# Patient Record
Sex: Male | Born: 1970
Health system: Southern US, Community
[De-identification: ages and names within clinical notes are randomized; demographics above are authoritative.]

## PROBLEM LIST (undated history)

## (undated) DIAGNOSIS — N529 Male erectile dysfunction, unspecified: Secondary | ICD-10-CM

## (undated) DIAGNOSIS — G709 Myoneural disorder, unspecified: Secondary | ICD-10-CM

## (undated) DIAGNOSIS — B019 Varicella without complication: Secondary | ICD-10-CM

## (undated) DIAGNOSIS — L989 Disorder of the skin and subcutaneous tissue, unspecified: Secondary | ICD-10-CM

## (undated) DIAGNOSIS — F329 Major depressive disorder, single episode, unspecified: Secondary | ICD-10-CM

## (undated) DIAGNOSIS — G0400 Acute disseminated encephalitis and encephalomyelitis, unspecified: Secondary | ICD-10-CM

## (undated) DIAGNOSIS — E119 Type 2 diabetes mellitus without complications: Secondary | ICD-10-CM

## (undated) DIAGNOSIS — J189 Pneumonia, unspecified organism: Secondary | ICD-10-CM

## (undated) DIAGNOSIS — T7840XA Allergy, unspecified, initial encounter: Secondary | ICD-10-CM

## (undated) DIAGNOSIS — R0600 Dyspnea, unspecified: Secondary | ICD-10-CM

## (undated) DIAGNOSIS — F419 Anxiety disorder, unspecified: Secondary | ICD-10-CM

## (undated) DIAGNOSIS — K5909 Other constipation: Secondary | ICD-10-CM

## (undated) DIAGNOSIS — T4145XA Adverse effect of unspecified anesthetic, initial encounter: Secondary | ICD-10-CM

## (undated) DIAGNOSIS — G459 Transient cerebral ischemic attack, unspecified: Secondary | ICD-10-CM

## (undated) DIAGNOSIS — G35 Multiple sclerosis: Secondary | ICD-10-CM

## (undated) DIAGNOSIS — I10 Essential (primary) hypertension: Secondary | ICD-10-CM

## (undated) DIAGNOSIS — Z9989 Dependence on other enabling machines and devices: Secondary | ICD-10-CM

## (undated) DIAGNOSIS — I639 Cerebral infarction, unspecified: Secondary | ICD-10-CM

## (undated) DIAGNOSIS — G4730 Sleep apnea, unspecified: Secondary | ICD-10-CM

## (undated) DIAGNOSIS — T8859XA Other complications of anesthesia, initial encounter: Secondary | ICD-10-CM

## (undated) DIAGNOSIS — R51 Headache: Secondary | ICD-10-CM

## (undated) DIAGNOSIS — E161 Other hypoglycemia: Secondary | ICD-10-CM

## (undated) DIAGNOSIS — B2799 Infectious mononucleosis, unspecified with other complication: Secondary | ICD-10-CM

## (undated) DIAGNOSIS — K5900 Constipation, unspecified: Secondary | ICD-10-CM

## (undated) DIAGNOSIS — E785 Hyperlipidemia, unspecified: Secondary | ICD-10-CM

## (undated) DIAGNOSIS — G4733 Obstructive sleep apnea (adult) (pediatric): Secondary | ICD-10-CM

## (undated) DIAGNOSIS — B178 Other specified acute viral hepatitis: Secondary | ICD-10-CM

## (undated) DIAGNOSIS — M7502 Adhesive capsulitis of left shoulder: Secondary | ICD-10-CM

## (undated) DIAGNOSIS — R110 Nausea: Secondary | ICD-10-CM

## (undated) DIAGNOSIS — T884XXA Failed or difficult intubation, initial encounter: Secondary | ICD-10-CM

## (undated) HISTORY — DX: Allergy, unspecified, initial encounter: T78.40XA

## (undated) HISTORY — PX: MYRINGOTOMY: SUR874

## (undated) HISTORY — PX: TONSILLECTOMY AND ADENOIDECTOMY: SUR1326

## (undated) HISTORY — DX: Hyperlipidemia, unspecified: E78.5

## (undated) HISTORY — DX: Myoneural disorder, unspecified: G70.9

## (undated) HISTORY — DX: Nausea: R11.0

---
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
## 1998-07-06 DIAGNOSIS — G459 Transient cerebral ischemic attack, unspecified: Secondary | ICD-10-CM

## 1998-07-06 DIAGNOSIS — I639 Cerebral infarction, unspecified: Secondary | ICD-10-CM

## 1998-07-06 DIAGNOSIS — Z8673 Personal history of transient ischemic attack (TIA), and cerebral infarction without residual deficits: Secondary | ICD-10-CM

## 1998-07-06 HISTORY — DX: Transient cerebral ischemic attack, unspecified: G45.9

## 1998-07-06 HISTORY — DX: Cerebral infarction, unspecified: I63.9

## 1998-07-06 HISTORY — DX: Personal history of transient ischemic attack (TIA), and cerebral infarction without residual deficits: Z86.73

## 2001-07-06 HISTORY — PX: ESOPHAGOGASTRODUODENOSCOPY: SHX1529

## 2001-07-06 HISTORY — PX: COLONOSCOPY: SHX174

## 2003-07-07 DIAGNOSIS — F32A Depression, unspecified: Secondary | ICD-10-CM

## 2003-07-07 DIAGNOSIS — F419 Anxiety disorder, unspecified: Secondary | ICD-10-CM

## 2003-07-07 HISTORY — DX: Anxiety disorder, unspecified: F41.9

## 2003-07-07 HISTORY — DX: Depression, unspecified: F32.A

## 2003-09-12 ENCOUNTER — Ambulatory Visit (HOSPITAL_COMMUNITY): Admission: RE | Admit: 2003-09-12 | Discharge: 2003-09-12 | Payer: Self-pay | Admitting: Neurology

## 2005-05-10 ENCOUNTER — Encounter: Admission: RE | Admit: 2005-05-10 | Discharge: 2005-05-10 | Payer: Self-pay | Admitting: Neurology

## 2007-10-11 ENCOUNTER — Encounter: Admission: RE | Admit: 2007-10-11 | Discharge: 2007-10-11 | Payer: Self-pay | Admitting: Psychiatry

## 2008-07-06 DIAGNOSIS — J189 Pneumonia, unspecified organism: Secondary | ICD-10-CM

## 2008-07-06 HISTORY — DX: Pneumonia, unspecified organism: J18.9

## 2009-06-11 ENCOUNTER — Inpatient Hospital Stay (HOSPITAL_COMMUNITY): Admission: EM | Admit: 2009-06-11 | Discharge: 2009-06-13 | Payer: Self-pay | Admitting: Emergency Medicine

## 2009-06-11 ENCOUNTER — Encounter (INDEPENDENT_AMBULATORY_CARE_PROVIDER_SITE_OTHER): Payer: Self-pay | Admitting: Internal Medicine

## 2010-10-07 LAB — CARDIAC PANEL(CRET KIN+CKTOT+MB+TROPI)
CK, MB: 3 ng/mL (ref 0.3–4.0)
CK, MB: 4.3 ng/mL — ABNORMAL HIGH (ref 0.3–4.0)
CK, MB: 4.5 ng/mL — ABNORMAL HIGH (ref 0.3–4.0)
Relative Index: 1 (ref 0.0–2.5)
Relative Index: 1.5 (ref 0.0–2.5)
Relative Index: 1.6 (ref 0.0–2.5)
Total CK: 265 U/L — ABNORMAL HIGH (ref 7–232)
Total CK: 292 U/L — ABNORMAL HIGH (ref 7–232)
Total CK: 298 U/L — ABNORMAL HIGH (ref 7–232)
Troponin I: 0.02 ng/mL (ref 0.00–0.06)
Troponin I: 0.02 ng/mL (ref 0.00–0.06)
Troponin I: 0.02 ng/mL (ref 0.00–0.06)

## 2010-10-07 LAB — COMPREHENSIVE METABOLIC PANEL
ALT: 28 U/L (ref 0–53)
AST: 20 U/L (ref 0–37)
Albumin: 3.7 g/dL (ref 3.5–5.2)
Alkaline Phosphatase: 52 U/L (ref 39–117)
BUN: 10 mg/dL (ref 6–23)
CO2: 26 mEq/L (ref 19–32)
Calcium: 9.3 mg/dL (ref 8.4–10.5)
Chloride: 105 mEq/L (ref 96–112)
Creatinine, Ser: 0.84 mg/dL (ref 0.4–1.5)
GFR calc Af Amer: >60 mL/min (ref >60)
GFR calc non Af Amer: >60 mL/min (ref >60)
Glucose, Bld: 113 mg/dL — ABNORMAL HIGH (ref 70–99)
Potassium: 3.8 mEq/L (ref 3.5–5.1)
Sodium: 140 mEq/L (ref 135–145)
Total Bilirubin: 0.9 mg/dL (ref 0.3–1.2)
Total Protein: 6.7 g/dL (ref 6.0–8.3)

## 2010-10-07 LAB — BASIC METABOLIC PANEL
BUN: 11 mg/dL (ref 6–23)
BUN: 7 mg/dL (ref 6–23)
CO2: 26 mEq/L (ref 19–32)
CO2: 28 mEq/L (ref 19–32)
Calcium: 8.9 mg/dL (ref 8.4–10.5)
Calcium: 9.1 mg/dL (ref 8.4–10.5)
Chloride: 103 mEq/L (ref 96–112)
Chloride: 109 mEq/L (ref 96–112)
Creatinine, Ser: 0.9 mg/dL (ref 0.4–1.5)
Creatinine, Ser: 0.93 mg/dL (ref 0.4–1.5)
GFR calc Af Amer: >60 mL/min (ref >60)
GFR calc Af Amer: >60 mL/min (ref >60)
GFR calc non Af Amer: >60 mL/min (ref >60)
GFR calc non Af Amer: >60 mL/min (ref >60)
Glucose, Bld: 120 mg/dL — ABNORMAL HIGH (ref 70–99)
Glucose, Bld: 120 mg/dL — ABNORMAL HIGH (ref 70–99)
Potassium: 2.9 mEq/L — ABNORMAL LOW (ref 3.5–5.1)
Potassium: 4 mEq/L (ref 3.5–5.1)
Sodium: 140 mEq/L (ref 135–145)
Sodium: 143 mEq/L (ref 135–145)

## 2010-10-07 LAB — DIFFERENTIAL
Basophils Absolute: 0.1 10*3/uL (ref 0.0–0.1)
Basophils Relative: 1 % (ref 0–1)
Eosinophils Absolute: 0.1 10*3/uL (ref 0.0–0.7)
Eosinophils Relative: 2 % (ref 0–5)
Lymphocytes Relative: 32 % (ref 12–46)
Lymphs Abs: 3 10*3/uL (ref 0.7–4.0)
Monocytes Absolute: 0.8 10*3/uL (ref 0.1–1.0)
Monocytes Relative: 9 % (ref 3–12)
Neutro Abs: 5.2 10*3/uL (ref 1.7–7.7)
Neutrophils Relative %: 57 % (ref 43–77)

## 2010-10-07 LAB — TROPONIN I: Troponin I: 0.01 ng/mL (ref 0.00–0.06)

## 2010-10-07 LAB — CBC
HCT: 41.7 % (ref 39.0–52.0)
HCT: 49.4 % (ref 39.0–52.0)
Hemoglobin: 14.4 g/dL (ref 13.0–17.0)
Hemoglobin: 16.6 g/dL (ref 13.0–17.0)
MCHC: 33.6 g/dL (ref 30.0–36.0)
MCHC: 34.6 g/dL (ref 30.0–36.0)
MCV: 83.8 fL (ref 78.0–100.0)
MCV: 84.2 fL (ref 78.0–100.0)
Platelets: 143 10*3/uL — ABNORMAL LOW (ref 150–400)
Platelets: 202 10*3/uL (ref 150–400)
RBC: 4.98 MIL/uL (ref 4.22–5.81)
RBC: 5.87 MIL/uL — ABNORMAL HIGH (ref 4.22–5.81)
RDW: 13.6 % (ref 11.5–15.5)
RDW: 13.9 % (ref 11.5–15.5)
WBC: 8.1 10*3/uL (ref 4.0–10.5)
WBC: 9.2 10*3/uL (ref 4.0–10.5)

## 2010-10-07 LAB — APTT: aPTT: 28 seconds (ref 24–37)

## 2010-10-07 LAB — POCT CARDIAC MARKERS
CKMB, poc: <1 ng/mL — ABNORMAL LOW (ref 1.0–8.0)
Myoglobin, poc: 61.5 ng/mL (ref 12–200)
Troponin i, poc: <0.05 ng/mL (ref 0.00–0.09)

## 2010-10-07 LAB — CULTURE, BLOOD (ROUTINE X 2)
Culture: NO GROWTH
Culture: NO GROWTH

## 2010-10-07 LAB — D-DIMER, QUANTITATIVE: D-Dimer, Quant: 1.08 ug/mL-FEU — ABNORMAL HIGH (ref 0.00–0.48)

## 2010-10-07 LAB — CK TOTAL AND CKMB (NOT AT ARMC)
CK, MB: 1.1 ng/mL (ref 0.3–4.0)
Relative Index: 0.7 (ref 0.0–2.5)
Total CK: 153 U/L (ref 7–232)

## 2010-10-07 LAB — TSH: TSH: 2.216 u[IU]/mL (ref 0.350–4.500)

## 2010-10-07 LAB — PROTIME-INR
INR: 1.06 (ref 0.00–1.49)
Prothrombin Time: 13.7 seconds (ref 11.6–15.2)

## 2011-06-06 DIAGNOSIS — B178 Other specified acute viral hepatitis: Secondary | ICD-10-CM

## 2011-06-06 HISTORY — DX: Other specified acute viral hepatitis: B17.8

## 2011-06-16 ENCOUNTER — Ambulatory Visit (INDEPENDENT_AMBULATORY_CARE_PROVIDER_SITE_OTHER): Payer: Managed Care, Other (non HMO)

## 2011-06-16 DIAGNOSIS — K219 Gastro-esophageal reflux disease without esophagitis: Secondary | ICD-10-CM

## 2011-07-03 ENCOUNTER — Other Ambulatory Visit: Payer: Self-pay

## 2011-07-03 ENCOUNTER — Encounter: Payer: Self-pay | Admitting: Neurology

## 2011-07-03 ENCOUNTER — Ambulatory Visit (INDEPENDENT_AMBULATORY_CARE_PROVIDER_SITE_OTHER): Payer: Managed Care, Other (non HMO)

## 2011-07-03 ENCOUNTER — Observation Stay (HOSPITAL_COMMUNITY)
Admission: EM | Admit: 2011-07-03 | Discharge: 2011-07-05 | Disposition: A | Discharge status: Home / Self Care | Payer: Managed Care, Other (non HMO) | Attending: Family Medicine | Admitting: Family Medicine

## 2011-07-03 ENCOUNTER — Emergency Department (HOSPITAL_COMMUNITY): Payer: Managed Care, Other (non HMO)

## 2011-07-03 DIAGNOSIS — R7309 Other abnormal glucose: Secondary | ICD-10-CM | POA: Insufficient documentation

## 2011-07-03 DIAGNOSIS — Z23 Encounter for immunization: Secondary | ICD-10-CM | POA: Insufficient documentation

## 2011-07-03 DIAGNOSIS — I10 Essential (primary) hypertension: Secondary | ICD-10-CM

## 2011-07-03 DIAGNOSIS — G0400 Acute disseminated encephalitis and encephalomyelitis, unspecified: Secondary | ICD-10-CM

## 2011-07-03 DIAGNOSIS — R7989 Other specified abnormal findings of blood chemistry: Secondary | ICD-10-CM

## 2011-07-03 DIAGNOSIS — D6949 Other primary thrombocytopenia: Secondary | ICD-10-CM

## 2011-07-03 DIAGNOSIS — R112 Nausea with vomiting, unspecified: Secondary | ICD-10-CM

## 2011-07-03 DIAGNOSIS — G35 Multiple sclerosis: Secondary | ICD-10-CM | POA: Insufficient documentation

## 2011-07-03 DIAGNOSIS — IMO0001 Reserved for inherently not codable concepts without codable children: Secondary | ICD-10-CM

## 2011-07-03 DIAGNOSIS — R9431 Abnormal electrocardiogram [ECG] [EKG]: Secondary | ICD-10-CM

## 2011-07-03 DIAGNOSIS — G4733 Obstructive sleep apnea (adult) (pediatric): Secondary | ICD-10-CM | POA: Insufficient documentation

## 2011-07-03 DIAGNOSIS — B2790 Infectious mononucleosis, unspecified without complication: Principal | ICD-10-CM | POA: Insufficient documentation

## 2011-07-03 DIAGNOSIS — R161 Splenomegaly, not elsewhere classified: Secondary | ICD-10-CM

## 2011-07-03 DIAGNOSIS — B9789 Other viral agents as the cause of diseases classified elsewhere: Secondary | ICD-10-CM

## 2011-07-03 DIAGNOSIS — R7303 Prediabetes: Secondary | ICD-10-CM

## 2011-07-03 DIAGNOSIS — K759 Inflammatory liver disease, unspecified: Secondary | ICD-10-CM

## 2011-07-03 HISTORY — DX: Multiple sclerosis: G35

## 2011-07-03 HISTORY — DX: Major depressive disorder, single episode, unspecified: F32.9

## 2011-07-03 HISTORY — DX: Anxiety disorder, unspecified: F41.9

## 2011-07-03 HISTORY — DX: Cerebral infarction, unspecified: I63.9

## 2011-07-03 HISTORY — DX: Pneumonia, unspecified organism: J18.9

## 2011-07-03 HISTORY — DX: Headache: R51

## 2011-07-03 HISTORY — DX: Transient cerebral ischemic attack, unspecified: G45.9

## 2011-07-03 HISTORY — DX: Other hypoglycemia: E16.1

## 2011-07-03 HISTORY — DX: Sleep apnea, unspecified: G47.30

## 2011-07-03 HISTORY — DX: Obstructive sleep apnea (adult) (pediatric): G47.33

## 2011-07-03 HISTORY — DX: Essential (primary) hypertension: I10

## 2011-07-03 LAB — CBC
HCT: 48.3 % (ref 39.0–52.0)
HCT: 47.1 % (ref 39.0–52.0)
Hemoglobin: 17 g/dL (ref 13.0–17.0)
Hemoglobin: 16.6 g/dL (ref 13.0–17.0)
MCH: 28.1 pg (ref 26.0–34.0)
MCH: 28 pg (ref 26.0–34.0)
MCHC: 35.2 g/dL (ref 30.0–36.0)
MCHC: 35.2 g/dL (ref 30.0–36.0)
MCV: 80 fL (ref 78.0–100.0)
MCV: 79.4 fL (ref 78.0–100.0)
Platelets: 120 10*3/uL — ABNORMAL LOW (ref 150–400)
Platelets: 128 10*3/uL — ABNORMAL LOW (ref 150–400)
RBC: 6.04 MIL/uL — ABNORMAL HIGH (ref 4.22–5.81)
RBC: 5.93 MIL/uL — ABNORMAL HIGH (ref 4.22–5.81)
RDW: 14 % (ref 11.5–15.5)
RDW: 13.9 % (ref 11.5–15.5)
WBC: 10.1 10*3/uL (ref 4.0–10.5)
WBC: 8.8 10*3/uL (ref 4.0–10.5)

## 2011-07-03 LAB — CREATININE, SERUM
Creatinine, Ser: 1.05 mg/dL (ref 0.50–1.35)
GFR calc Af Amer: >90 mL/min (ref >90)
GFR calc non Af Amer: 88 mL/min — ABNORMAL LOW (ref >90)

## 2011-07-03 LAB — LIPASE, BLOOD: Lipase: 42 U/L (ref 11–59)

## 2011-07-03 LAB — COMPREHENSIVE METABOLIC PANEL
ALT: 376 U/L — ABNORMAL HIGH (ref 0–53)
AST: 123 U/L — ABNORMAL HIGH (ref 0–37)
Albumin: 4.1 g/dL (ref 3.5–5.2)
Alkaline Phosphatase: 149 U/L — ABNORMAL HIGH (ref 39–117)
BUN: 16 mg/dL (ref 6–23)
CO2: 29 mEq/L (ref 19–32)
Calcium: 10.1 mg/dL (ref 8.4–10.5)
Chloride: 96 mEq/L (ref 96–112)
Creatinine, Ser: 1.02 mg/dL (ref 0.50–1.35)
GFR calc Af Amer: >90 mL/min (ref >90)
GFR calc non Af Amer: >90 mL/min (ref >90)
Glucose, Bld: 87 mg/dL (ref 70–99)
Potassium: 3.7 mEq/L (ref 3.5–5.1)
Sodium: 137 mEq/L (ref 135–145)
Total Bilirubin: 0.8 mg/dL (ref 0.3–1.2)
Total Protein: 7.5 g/dL (ref 6.0–8.3)

## 2011-07-03 LAB — CARDIAC PANEL(CRET KIN+CKTOT+MB+TROPI)
CK, MB: 1.7 ng/mL (ref 0.3–4.0)
Relative Index: 1.4 (ref 0.0–2.5)
Total CK: 124 U/L (ref 7–232)
Troponin I: <0.3 ng/mL (ref <0.30)

## 2011-07-03 LAB — DIFFERENTIAL
Band Neutrophils: 0 % (ref 0–10)
Basophils Absolute: 0 10*3/uL (ref 0.0–0.1)
Basophils Relative: 0 % (ref 0–1)
Blasts: 0 %
Eosinophils Absolute: 0 10*3/uL (ref 0.0–0.7)
Eosinophils Relative: 0 % (ref 0–5)
Lymphocytes Relative: 65 % — ABNORMAL HIGH (ref 12–46)
Lymphs Abs: 6.6 10*3/uL — ABNORMAL HIGH (ref 0.7–4.0)
Metamyelocytes Relative: 0 %
Monocytes Absolute: 0.8 10*3/uL (ref 0.1–1.0)
Monocytes Relative: 8 % (ref 3–12)
Myelocytes: 0 %
Neutro Abs: 2.7 10*3/uL (ref 1.7–7.7)
Neutrophils Relative %: 27 % — ABNORMAL LOW (ref 43–77)
Promyelocytes Absolute: 0 %
nRBC: 0 /100 WBC

## 2011-07-03 LAB — MONONUCLEOSIS SCREEN: Mono Screen: POSITIVE — AB

## 2011-07-03 LAB — POCT I-STAT TROPONIN I: Troponin i, poc: 0.01 ng/mL (ref 0.00–0.08)

## 2011-07-03 MED ORDER — HYDROCODONE-ACETAMINOPHEN 5-325 MG PO TABS
1.0000 | ORAL_TABLET | ORAL | Status: DC | PRN
  Filled 2011-07-03: qty 1

## 2011-07-03 MED ORDER — ACETAMINOPHEN 650 MG RE SUPP: 650.0000 mg | Freq: Four times a day (QID) | RECTAL | Status: DC | PRN

## 2011-07-03 MED ORDER — ONDANSETRON HCL 4 MG/2ML IJ SOLN: 4.0000 mg | Freq: Four times a day (QID) | INTRAMUSCULAR | Status: DC | PRN

## 2011-07-03 MED ORDER — HEPARIN SODIUM (PORCINE) 5000 UNIT/ML IJ SOLN
5000.0000 [IU] | Freq: Three times a day (TID) | INTRAMUSCULAR | Status: DC
  Administered 2011-07-04 – 2011-07-05 (×5): 5000 [IU] via SUBCUTANEOUS
  Filled 2011-07-03 (×8): qty 1

## 2011-07-03 MED ORDER — INSULIN ASPART 100 UNIT/ML ~~LOC~~ SOLN: 0.0000 [IU] | Freq: Three times a day (TID) | SUBCUTANEOUS | Status: DC

## 2011-07-03 MED ORDER — ACETAMINOPHEN 325 MG PO TABS
650.0000 mg | ORAL_TABLET | Freq: Four times a day (QID) | ORAL | Status: DC | PRN
  Administered 2011-07-04 (×2): 650 mg via ORAL
  Administered 2011-07-04: 325 mg via ORAL
  Filled 2011-07-03 (×3): qty 2

## 2011-07-03 MED ORDER — ONDANSETRON HCL 4 MG PO TABS: 4.0000 mg | ORAL_TABLET | Freq: Four times a day (QID) | ORAL | Status: DC | PRN

## 2011-07-03 MED ORDER — SODIUM CHLORIDE 0.9 % IV SOLN
INTRAVENOUS | Status: DC
  Administered 2011-07-03 – 2011-07-05 (×5): via INTRAVENOUS

## 2011-07-03 NOTE — ED Provider Notes (Signed)
History     CSN: [id number]  Arrival date & time 07/03/11  1704   First MD Initiated Contact with Patient 07/03/11 1711      Chief Complaint  Patient presents with  . Nausea  . Abnormal ECG    (Consider location/radiation/quality/duration/timing/severity/associated sxs/prior treatment) HPI Comments: Patient presents from [place] urgent care with a four-day history of drenching sweats associated with nausea. Patient has not had any vomiting. Patient denies fever. At [facility] he was found to have EKG changes, specifically abnormal T waves inferiorly. The patient has not had any chest pain. The patient has cardiac risk factors including hypertension, diabetes, family history of heart attack. Aspirin given at [facility] urgent care. Patient denies respiratory infection symptoms, shortness of breath, palpitations, abdominal pain, diarrhea, urinary symptoms. He has not tried to treat himself prior to arrival.  The history is provided by the patient.    Past Medical History  Diagnosis Date  . Diabetes mellitus   . Hypertension   . MS (multiple sclerosis)     History reviewed. No pertinent past surgical history.  No family history on file.  History  Substance Use Topics  . Smoking status: Former Games developer  . Smokeless tobacco: Not on file  . Alcohol Use: No      Review of Systems  Constitutional: Positive for chills and diaphoresis. Negative for fever.  HENT: Negative for sore throat and rhinorrhea.   Eyes: Negative for redness.  Respiratory: Negative for shortness of breath.   Cardiovascular: Negative for chest pain, palpitations and leg swelling.  Gastrointestinal: Positive for nausea. Negative for vomiting, abdominal pain, diarrhea and constipation.  Genitourinary: Negative for dysuria.  Musculoskeletal: Negative for myalgias.  Skin: Negative for rash.  Neurological: Negative for dizziness and headaches.    Allergies  Sulfa antibiotics; Cephalosporins; Penicillins; and  Quinolones  Home Medications   Current Outpatient Rx  Name Route Sig Dispense Refill  . LISINOPRIL-HYDROCHLOROTHIAZIDE 20-12.5 MG PO TABS Oral Take 1 tablet by mouth daily.        BP 123/76  Pulse 92  Temp(Src) 98.7 F (37.1 C) (Oral)  Resp 18  SpO2 97%  Physical Exam  Nursing note and vitals reviewed. Constitutional: He is oriented to person, place, and time. He appears well-developed and well-nourished.       Obese  HENT:  Head: Normocephalic and atraumatic.  Eyes: Conjunctivae are normal. Right eye exhibits no discharge. Left eye exhibits no discharge.  Neck: Normal range of motion. Neck supple.  Cardiovascular: Normal rate, regular rhythm, normal heart sounds and intact distal pulses.   No murmur heard.      Upper extremity pulses 2+ bilaterally.  Pulmonary/Chest: Effort normal and breath sounds normal.  Abdominal: Soft. Bowel sounds are normal. There is no tenderness. There is no rebound and no guarding.  Musculoskeletal: He exhibits no edema.  Neurological: He is alert and oriented to person, place, and time.  Skin: Skin is warm and dry.  Psychiatric: He has a normal mood and affect.    ED Course  Procedures (including critical care time)  Labs Reviewed  CBC - Abnormal; Notable for the following:    RBC 6.04 (*)    Platelets 120 (*)    All other components within normal limits  DIFFERENTIAL - Abnormal; Notable for the following:    Neutrophils Relative 27 (*)    Lymphocytes Relative 65 (*)    Lymphs Abs 6.6 (*)    All other components within normal limits  COMPREHENSIVE METABOLIC PANEL - Abnormal;  Notable for the following:    AST 123 (*)    ALT 376 (*)    Alkaline Phosphatase 149 (*)    All other components within normal limits  CBC - Abnormal; Notable for the following:    RBC 5.93 (*)    Platelets 128 (*)    All other components within normal limits  CREATININE, SERUM - Abnormal; Notable for the following:    GFR calc non Af Amer 88 (*)    All other  components within normal limits  MONONUCLEOSIS SCREEN - Abnormal; Notable for the following:    Mono Screen POSITIVE (*)    All other components within normal limits  POCT I-STAT TROPONIN I  LIPASE, BLOOD  CARDIAC PANEL(CRET KIN+CKTOT+MB+TROPI)  I-STAT TROPONIN I  HEMOGLOBIN A1C  CARDIAC PANEL(CRET KIN+CKTOT+MB+TROPI)  COMPREHENSIVE METABOLIC PANEL  CBC  POCT CBG MONITORING  HEPATITIS PANEL, ACUTE  LIPID PANEL  TSH  CARDIAC PANEL(CRET KIN+CKTOT+MB+TROPI)  SAVE SMEAR   Dg Chest 2 View  07/03/2011  *RADIOLOGY REPORT*  Clinical Data: EKG changes, hypertension  CHEST - 2 VIEW  Comparison: 06/11/2009  Findings: Cardiomediastinal silhouette is unremarkable.  No acute infiltrate or pleural effusion.  No pulmonary edema.  Probable small calcified granuloma in the right lower lobe. Mild degenerative changes thoracic spine.  IMPRESSION: No active disease.  Original Report Authenticated By: Natasha [name], M.D.   US Abdomen Complete  07/03/2011  *RADIOLOGY REPORT*  Clinical Data:  Elevated liver function tests.  History of hypertension and diabetes.  COMPLETE ABDOMINAL ULTRASOUND 07/03/2011:  Comparison:  None.  Findings:  Gallbladder:  Solitary approximate 1.5 cm shadowing gallstone.  No gallbladder wall thickening or pericholecystic fluid.  Negative sonographic Murphy's sign according to the ultrasound technologist.  Common bile duct:  Normal in caliber with maximum diameter approximating 6 mm.  Liver:  Diffusely increased and coarsened echotexture consistent with steatosis.  Focal sparing in the gallbladder fossa region.  No other focal hepatic parenchymal abnormality.  Patent portal vein with hepatopetal flow.  IVC:  Patent.  Pancreas:  Although the pancreas is difficult to visualize in its entirety, no focal pancreatic abnormality is identified.  Spleen:  Enlarged, measuring approximately 12.5 x 8.0 x 17.5 cm, yielding a volume of approximately 910 ml.  No focal splenic parenchymal abnormality.  Right  Kidney:  No hydronephrosis.  Well-preserved cortex.  No shadowing calculi.  Normal size and parenchymal echotexture without focal abnormalities.  Approximately 14.4 cm length.  Left Kidney:  No hydronephrosis.  Well-preserved cortex.  No shadowing calculi.  Normal size and parenchymal echotexture without focal abnormalities.  Approximately 14.5 cm length.  Abdominal aorta:  Normal in caliber throughout its visualized course in the abdomen without significant atherosclerosis.  IMPRESSION:  1.  Solitary approximate 1.5 cm gallstone within the gallbladder. No sonographic evidence of acute cholecystitis. 2.  Diffuse hepatic steatosis with focal sparing surrounding the gallbladder. 3.  Splenomegaly.  Original Report Authenticated By: Arnell Sieving, M.D.     1. Hepatitis   2. Abnormal EKG     5:33 PM Patient seen and examined. EKG and paperwork from [facility] reviewed. Aspirin given at [facility]. 5:33 PM Patient was discussed with Carleene Cooper III, MD  7:08 PM workup shows elevated liver enzymes. Chest x-ray reviewed by myself and is negative.  8:42 PM family practice to see patient in the emergency department. They will assess patient for admission to the hospital.    MDM  Admit for abnormal labs and EKG changes.    Medical screening  examination/treatment/procedure(s) were conducted as a shared visit with non-physician practitioner(s) and myself.  I personally evaluated the patient during the encounter Pt had chills and sweating over a 4 day period.  He was seen at [facility], where an abnormal EKG was noted, and he was sent to [facility] ED for evaluation.  Exam showed him to be in no distress.  Abdominal exam showed no mass or tenderness.  Our workup showed elevated LFT's and a solitary gallstone.  [facility] will admit pt for further workup. Carleene Cooper III M.D.     Eustace Moore [place], [place] 07/04/11 6440  Carleene Cooper III, MD 07/04/11 [id number]

## 2011-07-03 NOTE — ED Notes (Signed)
Pt being transported to ultrasound

## 2011-07-03 NOTE — ED Notes (Signed)
PER EMS- pt comes from [facility], being seen for nausea, diaphoresis since Monday. Pt reporting elevated HR 95-110, elevated BP 150/80 since Monday. While at [facility], EKG changes noted. Pt denying any SOB or CP.

## 2011-07-03 NOTE — ED Provider Notes (Signed)
6:16 PM  Date: 07/03/2011  Rate: 93  Rhythm: normal sinus rhythm  QRS Axis: right  Intervals: normal  ST/T Wave abnormalities: normal  Conduction Disutrbances:left anterior fascicular block  Narrative Interpretation: Abnormal EKG  Old EKG Reviewed: changes noted--[facility] has changed, now has LAFB since 06/11/2009.    Carleene Cooper III, MD 07/03/11 [id number]

## 2011-07-03 NOTE — H&P (Signed)
Brandon [name] is an 40 y.o. male.   Chief Complaint: Nausea, Fatigue  HPI: This is a 40 yo male with history of HTN, Pre-Diabetes, Multiple sclerosis who has had increasing nausea and vomiting over the past few days.  Was seen at [facility] today and was found to have new t-wave inversions on EKG and was sent to ED.  States that this week he has had episodes of feeling like his heart is racing and feeling very hot and fatigued.  Nausea and feeling hot is worse after eating.  He denies any chest pain or abdominal pain, fever, chills, headache.   In ED abd US done and found to have solitary gallstone without acute cholecystitis, liver changes compatible with hepatic steatosis, and splenomegaly.  EKG in ED shows no T-wave inversion and NSR.    Past Medical History  Diagnosis Date  . Pre-diabetes   . Hypertension   . MS (multiple sclerosis)- Followed by Dr. Danae Orleans   . OSA on CPAP     Past Surgical History  Procedure Date  . Tonsilectomy, adenoidectomy, bilateral myringotomy and tubes     Family History  Problem Relation Age of Onset  . Coronary artery disease Father   . Stroke Father   . Diabetes Mother    Social History:  reports that he quit smoking about 24 years ago. He does not have any smokeless tobacco history on file. He reports that he uses illicit drugs. He reports that he does not drink alcohol.  Allergies:  Allergies  Allergen Reactions  . Sulfa Antibiotics Other (See Comments)    It will kill him  . Cephalosporins Other (See Comments)    Reaction unspecified.  [name] Penicillins Other (See Comments)    fever  . Quinolones Other (See Comments)    Reaction unspecified      Results for orders placed during the hospital encounter of 07/03/11 (from the past 48 hour(s))  CBC     Status: Abnormal   Collection Time   07/03/11  5:31 PM      Component Value Range Comment   WBC 10.1  4.0 - 10.5 (K/uL)    RBC 6.04 (*) 4.22 - 5.81 (MIL/uL)    Hemoglobin 17.0  13.0 -  17.0 (g/dL)    HCT 16.1  09.6 - 04.5 (%)    MCV 80.0  78.0 - 100.0 (fL)    MCH 28.1  26.0 - 34.0 (pg)    MCHC 35.2  30.0 - 36.0 (g/dL)    RDW 40.9  81.1 - 91.4 (%)    Platelets 120 (*) 150 - 400 (K/uL)   DIFFERENTIAL     Status: Abnormal   Collection Time   07/03/11  5:31 PM      Component Value Range Comment   Neutrophils Relative 27 (*) 43 - 77 (%)    Lymphocytes Relative 65 (*) 12 - 46 (%)    Monocytes Relative 8  3 - 12 (%)    Eosinophils Relative 0  0 - 5 (%)    Basophils Relative 0  0 - 1 (%)    Band Neutrophils 0  0 - 10 (%)    Metamyelocytes Relative 0      Myelocytes 0      Promyelocytes Absolute 0      Blasts 0      nRBC 0  0 (/100 WBC)    Neutro Abs 2.7  1.7 - 7.7 (K/uL)    Lymphs Abs 6.6 (*) 0.7 -  4.0 (K/uL)    Monocytes Absolute 0.8  0.1 - 1.0 (K/uL)    Eosinophils Absolute 0.0  0.0 - 0.7 (K/uL)    Basophils Absolute 0.0  0.0 - 0.1 (K/uL)    WBC Morphology ATYPICAL LYMPHOCYTES     COMPREHENSIVE METABOLIC PANEL     Status: Abnormal   Collection Time   07/03/11  5:31 PM      Component Value Range Comment   Sodium 137  135 - 145 (mEq/L)    Potassium 3.7  3.5 - 5.1 (mEq/L)    Chloride 96  96 - 112 (mEq/L)    CO2 29  19 - 32 (mEq/L)    Glucose, Bld 87  70 - 99 (mg/dL)    BUN 16  6 - 23 (mg/dL)    Creatinine, Ser 1.61  0.50 - 1.35 (mg/dL)    Calcium 09.6  8.4 - 10.5 (mg/dL)    Total Protein 7.5  6.0 - 8.3 (g/dL)    Albumin 4.1  3.5 - 5.2 (g/dL)    AST 045 (*) 0 - 37 (U/L)    ALT 376 (*) 0 - 53 (U/L)    Alkaline Phosphatase 149 (*) 39 - 117 (U/L)    Total Bilirubin 0.8  0.3 - 1.2 (mg/dL)    GFR calc non Af Amer >90  >90 (mL/min)    GFR calc Af Amer >90  >90 (mL/min)   LIPASE, BLOOD     Status: Normal   Collection Time   07/03/11  5:31 PM      Component Value Range Comment   Lipase 42  11 - 59 (U/L)   POCT I-STAT TROPONIN I     Status: Normal   Collection Time   07/03/11  5:56 PM      Component Value Range Comment   Troponin i, poc 0.01  0.00 - 0.08  (ng/mL)    Comment 3             Dg Chest 2 View  07/03/2011  *RADIOLOGY REPORT*  Clinical Data: EKG changes, hypertension  CHEST - 2 VIEW  Comparison: 06/11/2009  Findings: Cardiomediastinal silhouette is unremarkable.  No acute infiltrate or pleural effusion.  No pulmonary edema.  Probable small calcified granuloma in the right lower lobe. Mild degenerative changes thoracic spine.  IMPRESSION: No active disease.  Original Report Authenticated By: Natasha [name], M.D.   US Abdomen Complete  07/03/2011  *RADIOLOGY REPORT*  Clinical Data:  Elevated liver function tests.  History of hypertension and diabetes.  COMPLETE ABDOMINAL ULTRASOUND 07/03/2011:  Comparison:  None.  Findings:  Gallbladder:  Solitary approximate 1.5 cm shadowing gallstone.  No gallbladder wall thickening or pericholecystic fluid.  Negative sonographic Murphy's sign according to the ultrasound technologist.  Common bile duct:  Normal in caliber with maximum diameter approximating 6 mm.  Liver:  Diffusely increased and coarsened echotexture consistent with steatosis.  Focal sparing in the gallbladder fossa region.  No other focal hepatic parenchymal abnormality.  Patent portal vein with hepatopetal flow.  IVC:  Patent.  Pancreas:  Although the pancreas is difficult to visualize in its entirety, no focal pancreatic abnormality is identified.  Spleen:  Enlarged, measuring approximately 12.5 x 8.0 x 17.5 cm, yielding a volume of approximately 910 ml.  No focal splenic parenchymal abnormality.  Right Kidney:  No hydronephrosis.  Well-preserved cortex.  No shadowing calculi.  Normal size and parenchymal echotexture without focal abnormalities.  Approximately 14.4 cm length.  Left Kidney:  No hydronephrosis.  Well-preserved cortex.  No shadowing calculi.  Normal size and parenchymal echotexture without focal abnormalities.  Approximately 14.5 cm length.  Abdominal aorta:  Normal in caliber throughout its visualized course in the abdomen without  significant atherosclerosis.  IMPRESSION:  1.  Solitary approximate 1.5 cm gallstone within the gallbladder. No sonographic evidence of acute cholecystitis. 2.  Diffuse hepatic steatosis with focal sparing surrounding the gallbladder. 3.  Splenomegaly.  Original Report Authenticated By: Arnell Sieving, M.D.    ROS PER HPI  Blood pressure 120/77, pulse 96, temperature 98.3 F (36.8 C), temperature source Oral, resp. rate 19, SpO2 100.00%. Physical Exam  General appearance: alert, cooperative and no distress Head: Normocephalic, without obvious abnormality, atraumatic Eyes: negative findings: conjunctivae and sclerae normal and pupils equal, round, reactive to light and accomodation Throat: normal findings: lips normal without lesions and buccal mucosa normal and abnormal findings: mild oropharyngeal erythema Neck: no adenopathy and thyroid not enlarged, symmetric, no tenderness/mass/nodules Lungs: clear to auscultation bilaterally Heart: regular rate and rhythm, S1, S2 normal, no murmur, click, rub or gallop Abdomen: normal findings: bowel sounds normal, liver span normal to percussion, no masses palpable and soft, non-tender and abnormal findings:  splenomegaly Extremities: extremities normal, atraumatic, no cyanosis or edema Pulses: 2+ and symmetric Skin: Skin color, texture, turgor normal. No rashes or lesions Neurologic: Mental status: Alert, oriented, thought content appropriate Cranial nerves: Tested and intact Sensory: normal Motor: grossly normal, patient with tremor in R leg after testing   Assessment/Plan 40 yo male with history of pre-diabetes, HTN and MS here with increasing nausea and vomiting found to have elevated LFT's, solitary gallstone without cholecystitis, and lymphocytosis.  1.  Nausea and vomiting: Could be 2/2 to biliary disease vs viral illness.  [place] without evidence of acute cholecysitis -Abdominal exam benign -Zofran for nausea control -Lymphocytosis  indicate possible viral infection -If not improving consider surgical consult for elective cholecystectomy.  2.  Pre-Diabetes:   -Check A1C -SSI and glucose monitoring for now  3.  HTN: -Continue Lisinopril/HCTZ and monitor pressures  4.  Transaminitis:  Likely 2/2 to steatosis vs. Biliary disease vs viral hepatitis -Check viral hepatitis panel -Check lipids -Repeat LFT's in am  5:  Lymphocytosis: Likely due to viral illness, ?mono -Check monospot -Peripheral smear -Monitor CBC  6.  Racing Heart/Sensation of Elevated Temp: -Afebrile -EKG with NSR -Place on telemetry overnight -Check TSH  7. Splenomegaly:  As above may be due to viral illness -Serial spleen exam and monitoring of CBC to be sure no sequestration  8.  FEN//GI: Regular CHO mod diet  9.  PPX:  Heparin SQ  10.  Dispo: Pending improvement  Suhaila Troiano 07/03/2011, 9:48 PM

## 2011-07-04 ENCOUNTER — Other Ambulatory Visit: Payer: Self-pay

## 2011-07-04 ENCOUNTER — Encounter (HOSPITAL_COMMUNITY): Payer: Self-pay | Admitting: General Practice

## 2011-07-04 DIAGNOSIS — E161 Other hypoglycemia: Secondary | ICD-10-CM

## 2011-07-04 HISTORY — DX: Other hypoglycemia: E16.1

## 2011-07-04 LAB — COMPREHENSIVE METABOLIC PANEL
ALT: 307 U/L — ABNORMAL HIGH (ref 0–53)
AST: 100 U/L — ABNORMAL HIGH (ref 0–37)
Albumin: 3.4 g/dL — ABNORMAL LOW (ref 3.5–5.2)
Alkaline Phosphatase: 129 U/L — ABNORMAL HIGH (ref 39–117)
BUN: 16 mg/dL (ref 6–23)
CO2: 30 mEq/L (ref 19–32)
Calcium: 8.8 mg/dL (ref 8.4–10.5)
Chloride: 98 mEq/L (ref 96–112)
Creatinine, Ser: 1.07 mg/dL (ref 0.50–1.35)
GFR calc Af Amer: >90 mL/min (ref >90)
GFR calc non Af Amer: 86 mL/min — ABNORMAL LOW (ref >90)
Glucose, Bld: 102 mg/dL — ABNORMAL HIGH (ref 70–99)
Potassium: 3.6 mEq/L (ref 3.5–5.1)
Sodium: 136 mEq/L (ref 135–145)
Total Bilirubin: 0.7 mg/dL (ref 0.3–1.2)
Total Protein: 6.5 g/dL (ref 6.0–8.3)

## 2011-07-04 LAB — LIPID PANEL
Cholesterol: 118 mg/dL (ref 0–200)
HDL: 16 mg/dL — ABNORMAL LOW (ref >39)
LDL Cholesterol: 50 mg/dL (ref 0–99)
Total CHOL/HDL Ratio: 7.4 RATIO
Triglycerides: 259 mg/dL — ABNORMAL HIGH (ref <150)
VLDL: 52 mg/dL — ABNORMAL HIGH (ref 0–40)

## 2011-07-04 LAB — CARDIAC PANEL(CRET KIN+CKTOT+MB+TROPI)
CK, MB: 1.8 ng/mL (ref 0.3–4.0)
CK, MB: 1.9 ng/mL (ref 0.3–4.0)
Relative Index: 1.4 (ref 0.0–2.5)
Relative Index: 1.4 (ref 0.0–2.5)
Total CK: 133 U/L (ref 7–232)
Total CK: 137 U/L (ref 7–232)
Troponin I: <0.3 ng/mL (ref <0.30)
Troponin I: <0.3 ng/mL (ref <0.30)

## 2011-07-04 LAB — HEPATITIS PANEL, ACUTE
HCV Ab: NEGATIVE
Hep A IgM: NEGATIVE
Hep B C IgM: NEGATIVE
Hepatitis B Surface Ag: NEGATIVE

## 2011-07-04 LAB — GLUCOSE, CAPILLARY
Glucose-Capillary: 92 mg/dL (ref 70–99)
Glucose-Capillary: 102 mg/dL — ABNORMAL HIGH (ref 70–99)
Glucose-Capillary: 91 mg/dL (ref 70–99)
Glucose-Capillary: 84 mg/dL (ref 70–99)

## 2011-07-04 LAB — HEMOGLOBIN A1C
Hgb A1c MFr Bld: 5.4 % (ref <5.7)
Mean Plasma Glucose: 108 mg/dL (ref <117)

## 2011-07-04 LAB — CBC
HCT: 42.4 % (ref 39.0–52.0)
Hemoglobin: 15 g/dL (ref 13.0–17.0)
MCH: 28 pg (ref 26.0–34.0)
MCHC: 35.4 g/dL (ref 30.0–36.0)
MCV: 79.3 fL (ref 78.0–100.0)
Platelets: 112 10*3/uL — ABNORMAL LOW (ref 150–400)
RBC: 5.35 MIL/uL (ref 4.22–5.81)
RDW: 14 % (ref 11.5–15.5)
WBC: 8.4 10*3/uL (ref 4.0–10.5)

## 2011-07-04 LAB — TSH: TSH: 1.465 u[IU]/mL (ref 0.350–4.500)

## 2011-07-04 LAB — SAVE SMEAR

## 2011-07-04 MED ORDER — INFLUENZA VIRUS VACC SPLIT PF IM SUSP
0.5000 mL | INTRAMUSCULAR | Status: AC
  Administered 2011-07-04: 0.5 mL via INTRAMUSCULAR
  Filled 2011-07-04: qty 0.5

## 2011-07-04 MED ORDER — INFLUENZA VIRUS VACC SPLIT PF IM SUSP: 0.5000 mL | INTRAMUSCULAR | Status: DC

## 2011-07-04 NOTE — H&P (Signed)
I interviewed and examined this patient and discussed the care plan with Dr. Ashley Royalty and the [facility] team and agree with assessment and plan as documented in the admission note for today. The positive monospot has high specificity for mononucleosis as the cause of the atypical lymphocytes and splenomegaly, but he denies recent pharyngitis and doesn't have apparent lymphadenopathy. According to Up-to-Date. "Rare false-positive heterophile tests have been reported in patients with leukemia, lymphoma, pancreatic cancer, systemic lupus erythematosus, HIV infection, and rubella [86]. In addition, heterophile antibodies can persist at low levels for up to one year after IM" We should consider HIV testing if the clinical course is atypical.     Torri Langston A. [name] Slider, MD Family Medicine Teaching Service Attending  07/04/2011 2:51 PM

## 2011-07-04 NOTE — ED Notes (Signed)
Patient is resting comfortably. No new complaints or needs. No changes.

## 2011-07-04 NOTE — ED Notes (Signed)
Family at bedside. 

## 2011-07-04 NOTE — Progress Notes (Signed)
PGY-1 Daily Progress Note Family Medicine Teaching Service D. Piloto Rolene Arbour, MD Service Pager: [phone]  Patient name: Brandon [name]  Medical record [id number] Date of birth:January 07, 1971 Age: 40 y.o. Gender: male  LOS: 1 day   Subjective: feeling better was able to eat breakfast. No nausea or vomiting. Has not had a BM since yesterday in the morning . Urine output normal with no frequency/ burning.   Objective:  Vitals: Temp:  100.2 F (37.9 C) (12/29 0506) Pulse Rate: 84  (12/29 0506) Resp: 18  (12/29 0506) BP: 124/81 mmHg (12/29 0506) SpO2: 99 % (12/29 0506) Weight:   264 lb 5.3 oz (119.9 kg) (12/29 0506)  Intake/Output Summary (Last 24 hours) at 07/04/11 1231 Last data filed at 07/04/11 0900  Gross per 24 hour  Intake 1382.08 ml  Output    800 ml  Net 582.08 ml   Physical Exam: General appearance: alert, cooperative and no distress  Eyes:  conjunctivae and sclerae normal and pupils equal, round, reactive to light  Throat: normal findings: lips normal without lesions and buccal mucosa normal and abnormal findings: mild oropharyngeal erythema  Neck: no adenopathy and thyroid not enlarged, symmetric, no tenderness/mass/nodules  Lungs: clear to auscultation bilaterally  Heart: regular rate and rhythm, S1, S2 normal, no murmur, click, rub or gallop  Abdomen: normal findings: bowel sounds normal, liver span normal to percussion, no masses palpable and soft, non-tender and abnormal findings: splenomegaly  Extremities: extremities normal, atraumatic, no cyanosis or edema  Pulses: 2+ and symmetric  Skin: Skin color, texture, turgor normal. No rashes or lesions  Neurologic: Mental status: Alert, oriented, thought content appropriate  Cranial nerves: Tested and intact  Sensory: normal  Motor: grossly normal, patient with tremor in R leg after testing   Labs and imaging:  CBC  Lab 07/04/11 0410 07/03/11 2233 07/03/11 1731  WBC 8.4 8.8 10.1  HGB 15.0 16.6 17.0  HCT 42.4  47.1 48.3  PLT 112* 128* 120*   BMET  Lab 07/04/11 0410 07/03/11 2233 07/03/11 1731  NA 136 -- 137  K 3.6 -- 3.7  CL 98 -- 96  CO2 30 -- 29  BUN 16 -- 16  CREATININE 1.07 1.05 1.02  LABGLOM -- -- --  GLUCOSE 102* -- --  CALCIUM 8.8 -- 10.1       Component Value Range   Neutrophils Relative 27 (*) 43 - 77 (%)   Lymphocytes Relative 65 (*) 12 - 46 (%)   Lymphs Abs 6.6 (*) 0.7 - 4.0 (K/uL)   WBC Morphology ATYPICAL LYMPHOCYTES    COMPREHENSIVE METABOLIC PANEL     Status: Abnormal   Collection Time   07/03/11  5:31 PM      Component Value Range   AST 123 (*) 0 - 37 (U/L)   ALT 376 (*) 0 - 53 (U/L)   Alkaline Phosphatase 149 (*) 39 - 117 (U/L)   Total Bilirubin 0.8  0.3 - 1.2 (mg/dL)  LIPASE, BLOOD     Status: Normal   Collection Time   07/03/11  5:31 PM      Component Value Range   Lipase 42  11 - 59 (U/L)  HEMOGLOBIN A1C     Status: Normal   Collection Time   07/03/11 10:33 PM      Component Value Range   Hemoglobin A1C 5.4  <5.7 (%)   Mean Plasma Glucose 108  <117 (mg/dL)  MONONUCLEOSIS SCREEN     Status: Abnormal   Collection Time  07/03/11 10:50 PM      Component Value Range   Mono Screen POSITIVE (*) NEGATIVE   TSH     Status: Normal   Collection Time   07/03/11 10:50 PM      Component Value Range   TSH 1.465  0.350 - 4.500 (uIU/mL)  SAVE SMEAR     Status: Normal   Collection Time   07/04/11  4:10 AM      Component Value Range   Smear Review SMEAR STAINED AND AVAILABLE FOR REVIEW    LIPID PANEL     Status: Abnormal   Collection Time   07/04/11  4:11 AM      Component Value Range   Cholesterol 118  0 - 200 (mg/dL)   Triglycerides 045 (*) <150 (mg/dL)   HDL 16 (*) >40 (mg/dL)   Total CHOL/HDL Ratio 7.4     VLDL 52 (*) 0 - 40 (mg/dL)   LDL Cholesterol 50  0 - 99 (mg/dL)   Dg Chest 2 View 98/05/9146 MPRESSION: No active disease.  Original Report Authenticated By: Natasha [name], M.D.   US Abdomen Complete 07/03/2011 .  IMPRESSION:  1.  Solitary  approximate 1.5 cm gallstone within the gallbladder. No sonographic evidence of acute cholecystitis. 2.  Diffuse hepatic steatosis with focal sparing surrounding the gallbladder. 3.  Splenomegaly.  Original Report Authenticated By: Arnell Sieving, M.D.   Medications: Medication Dose Route Frequency  . 0.9 %  sodium chloride infusion   Intravenous Continuous  . acetaminophen (TYLENOL) tablet 650 mg  650 mg Oral Q6H PRN    . acetaminophen (TYLENOL) suppository 650 mg  650 mg Rectal Q6H PRN  . heparin injection 5,000 Units  5,000 Units Subcutaneous Q8H  . HYDROcodone-acetaminophen (NORCO) 5-325 MG per tablet 1-2 tablet  1-2 tablet Oral Q4H PRN  . influenza  inactive virus vaccine (FLUZONE/FLUARIX) injection 0.5 mL  0.5 mL Intramuscular To Minor  . insulin aspart (novoLOG) injection 0-9 Units  0-9 Units Subcutaneous TID WC  . ondansetron (ZOFRAN) tablet 4 mg  4 mg Oral Q6H PRN    . ondansetron (ZOFRAN) injection 4 mg  4 mg Intravenous Q6H PRN   Assessment and Plan: 40 yo male with history of pre-diabetes, HTN and MS here with increasing nausea and vomiting found to have elevated LFT's, solitary gallstone without cholecystitis, and lymphocytosis.  1. Nausea/ Vomiting: Controlled. Was able to eat this morning breakfast. Test came back with positive for MONONUCLEOSIS, U/S positive for Splenomegaly and differential with Lymphocytosis and atypical Lymphocytes on smear. -Hydration and monitoring vitals & CBC. -Zofran PRN nausea. 2. Pre-Diabetes: Normal A1C at home on metformin 1000mg  BID -continue to monitor and SSI because pt is not in metformin while in the hospital. 3. HTN: Controlled. -Continue Lisinopril/HCTZ and monitor pressures  4. Transaminitis: They are trending down. This could be secondary to mononucleosis infection and/ or hepatic steatosis (metabolic syndrome) The acuity favors viral process but this should be f/u as outpatient when he is ready to be D/C. -Pending  viral hepatitis  panel  5. Racing Heart/Sensation of Elevated Temp: telemetry with no alarms overnight. Mildly febrile with good response to antipyretics  .EKG with NSR. Normal TSH. This could be explained by the viral process.  8. FEN//GI:CHO mod diet  9. PPX: Heparin SQ  10. Dispo: Pending improvement     D. Piloto Rolene Arbour, MD PGY1, Family Medicine Teaching Service Pager [phone] 07/04/2011

## 2011-07-04 NOTE — Progress Notes (Signed)
I interviewed and examined this patient and discussed the care plan with Dr. Marcha Dutton and the [facility] team and agree with assessment and plan as documented in the Admission note for today.    Celica Kotowski A. [name] Slider, MD Family Medicine Teaching Service Attending  07/04/2011 2:30 PM

## 2011-07-04 NOTE — ED Notes (Signed)
Vital signs stable. Pt denies needs or complaints. Continues in NSR. Resp are unlabored. Skin warm and dry. Family at bedside.

## 2011-07-04 NOTE — ED Notes (Signed)
Pt without acute changes in status. Reports mild pain in lower back/sacral area and mild headache. Requests tylenol, medicated with same. Also placed in recliner for comfort and states it is much better. NSR continues on monitor. Resp are unlabored. Skin warm and dry. VSS.

## 2011-07-05 LAB — COMPREHENSIVE METABOLIC PANEL
ALT: 285 U/L — ABNORMAL HIGH (ref 0–53)
AST: 91 U/L — ABNORMAL HIGH (ref 0–37)
Albumin: 3.1 g/dL — ABNORMAL LOW (ref 3.5–5.2)
Alkaline Phosphatase: 135 U/L — ABNORMAL HIGH (ref 39–117)
BUN: 10 mg/dL (ref 6–23)
CO2: 28 mEq/L (ref 19–32)
Calcium: 8.5 mg/dL (ref 8.4–10.5)
Chloride: 104 mEq/L (ref 96–112)
Creatinine, Ser: 0.92 mg/dL (ref 0.50–1.35)
GFR calc Af Amer: >90 mL/min (ref >90)
GFR calc non Af Amer: >90 mL/min (ref >90)
Glucose, Bld: 106 mg/dL — ABNORMAL HIGH (ref 70–99)
Potassium: 3.7 mEq/L (ref 3.5–5.1)
Sodium: 138 mEq/L (ref 135–145)
Total Bilirubin: 0.7 mg/dL (ref 0.3–1.2)
Total Protein: 6.3 g/dL (ref 6.0–8.3)

## 2011-07-05 LAB — GLUCOSE, CAPILLARY: Glucose-Capillary: 72 mg/dL (ref 70–99)

## 2011-07-05 MED ORDER — CYCLOBENZAPRINE HCL 5 MG PO TABS
5.0000 mg | ORAL_TABLET | Freq: Once | ORAL | Status: AC
  Administered 2011-07-05: 5 mg via ORAL
  Filled 2011-07-05: qty 1

## 2011-07-05 NOTE — Discharge Summary (Signed)
I  discussed the care plan with Dr. Shela Commons and the [facility] team and agree with assessment and plan as documented in the discharge note for today.    Vahan Wadsworth A. [name] Slider, MD Family Medicine Teaching Service Attending  07/05/2011 3:09 PM

## 2011-07-05 NOTE — Discharge Summary (Signed)
Physician Discharge Summary  Patient ID: Brandon [name] [id number] 12-18-1970 40 y.o.  Admit date: 07/03/2011 Discharge date: 07/05/2011  PCP: Georgian Co, PA, PA   Discharge Diagnosis: 1. Mononuclosis 2. Metabolic Syndrome ( Obesity, HTN, insulin resistance) 3. Hx of MS    Discharge Medications  Kristof, Nadeem  [facility] Medication Instructions [id number]   Printed on:07/05/11 1125  Medication Information                    metFORMIN (GLUCOPHAGE) 500 MG tablet Take 1,000 mg by mouth 2 (two) times daily with a meal.             lisinopril-hydrochlorothiazide (PRINZIDE,ZESTORETIC) 10-12.5 MG per tablet Take 1 tablet by mouth daily.             aspirin EC 81 MG tablet Take 81 mg by mouth daily.             fish oil-omega-3 fatty acids 1000 MG capsule Take 2 g by mouth daily.             naproxen sodium (ANAPROX) 220 MG tablet Take 440 mg by mouth daily as needed. For pain               Consults:  None  Labs: CBC  Lab 07/04/11 0410 07/03/11 2233 07/03/11 1731  WBC 8.4 8.8 10.1  HGB 15.0 16.6 17.0  HCT 42.4 47.1 48.3  PLT 112* 128* 120*   BMET  Lab 07/05/11 0952 07/04/11 0410 07/03/11 2233 07/03/11 1731  NA 138 136 -- 137  K 3.7 3.6 -- 3.7  CL 104 98 -- 96  CO2 28 30 -- 29  BUN 10 16 -- 16  CREATININE 0.92 1.07 1.05 --  CALCIUM 8.5 8.8 -- 10.1  PROT 6.3 6.5 -- 7.5  BILITOT 0.7 0.7 -- 0.8  ALKPHOS 135* 129* -- 149*  ALT 285* 307* -- 376*  AST 91* 100* -- 123*  GLUCOSE 106* 102* -- 87   DIFFERENTIAL     Status: Abnormal   Collection Time   07/03/11  5:31 PM      Component Value Range Comment   Neutrophils Relative 27 (*) 43 - 77 (%)    Lymphocytes Relative 65 (*) 12 - 46 (%)    Monocytes Relative 8  3 - 12 (%)    Eosinophils Relative 0  0 - 5 (%)    Basophils Relative 0  0 - 1 (%)    Band Neutrophils 0  0 - 10 (%)    Metamyelocytes Relative 0      Myelocytes 0      Promyelocytes Absolute 0      Blasts 0      nRBC 0  0 (/100 WBC)    Neutro  Abs 2.7  1.7 - 7.7 (K/uL)    Lymphs Abs 6.6 (*) 0.7 - 4.0 (K/uL)    Monocytes Absolute 0.8  0.1 - 1.0 (K/uL)    Eosinophils Absolute 0.0  0.0 - 0.7 (K/uL)    Basophils Absolute 0.0  0.0 - 0.1 (K/uL)    WBC Morphology ATYPICAL LYMPHOCYTES     LIPASE, BLOOD     Status: Normal   Collection Time   07/03/11  5:31 PM      Component Value Range Comment   Lipase 42  11 - 59 (U/L)   POCT I-STAT TROPONIN I     Status: Normal   Collection Time   07/03/11  5:56 PM  Component Value Range Comment   Troponin i, poc 0.01  0.00 - 0.08 (ng/mL)    Comment 3            HEMOGLOBIN A1C     Status: Normal   Collection Time   07/03/11 10:33 PM      Component Value Range Comment   Hemoglobin A1C 5.4  <5.7 (%)    Mean Plasma Glucose 108  <117 (mg/dL)   CARDIAC PANEL(CRET KIN+CKTOT+MB+TROPI)     Status: Normal   Collection Time   07/03/11 10:33 PM      Component Value Range Comment   Total CK 124  7 - 232 (U/L)    CK, MB 1.7  0.3 - 4.0 (ng/mL)    Troponin I <0.30  <0.30 (ng/mL)    Relative Index 1.4  0.0 - 2.5    HEPATITIS PANEL, ACUTE     Status: Normal   Collection Time   07/03/11 10:50 PM      Component Value Range Comment   Hepatitis B Surface Ag NEGATIVE  NEGATIVE     HCV Ab NEGATIVE  NEGATIVE     Hep A IgM NEGATIVE  NEGATIVE     Hep B C IgM NEGATIVE  NEGATIVE    MONONUCLEOSIS SCREEN     Status: Abnormal   Collection Time   07/03/11 10:50 PM      Component Value Range Comment   Mono Screen POSITIVE (*) NEGATIVE    TSH     Status: Normal   Collection Time   07/03/11 10:50 PM      Component Value Range Comment   TSH 1.465  0.350 - 4.500 (uIU/mL)   SAVE SMEAR     Status: Normal   Collection Time   07/04/11  4:10 AM      Component Value Range Comment   Smear Review SMEAR STAINED AND AVAILABLE FOR REVIEW     LIPID PANEL     Status: Abnormal   Collection Time   07/04/11  4:11 AM      Component Value Range Comment   Cholesterol 118  0 - 200 (mg/dL)    Triglycerides 096 (*) <150  (mg/dL)    HDL 16 (*) >04 (mg/dL)    Total CHOL/HDL Ratio 7.4      VLDL 52 (*) 0 - 40 (mg/dL)    LDL Cholesterol 50  0 - 99 (mg/dL)   CARDIAC PANEL(CRET KIN+CKTOT+MB+TROPI)     Status: Normal   Collection Time   07/04/11 10:00 AM      Component Value Range Comment   Total CK 133  7 - 232 (U/L)    CK, MB 1.8  0.3 - 4.0 (ng/mL)    Troponin I <0.30  <0.30 (ng/mL)    Relative Index 1.4  0.0 - 2.5    CARDIAC PANEL(CRET KIN+CKTOT+MB+TROPI)     Status: Normal   Collection Time   07/04/11  4:50 PM      Component Value Range Comment   Total CK 137  7 - 232 (U/L)    CK, MB 1.9  0.3 - 4.0 (ng/mL)    Troponin I <0.30  <0.30 (ng/mL)    Relative Index 1.4  0.0 - 2.5      Procedures/Imaging:  Dg Chest 2 View 07/03/2011  IMPRESSION: No active disease.  Original Report Authenticated By: Natasha [name], M.D.   US Abdomen Complete 07/03/2011   IMPRESSION:  1.  Solitary approximate 1.5 cm gallstone within the gallbladder. No sonographic evidence  of acute cholecystitis. 2.  Diffuse hepatic steatosis with focal sparing surrounding the gallbladder. 3.  Splenomegaly.  Original Report Authenticated By: Arnell Sieving, M.D.    Brief Hospital Course: 40 yo male with history of pre-diabetes, HTN and MS here with increasing nausea and vomiting  1. Nausea/ Vomiting: Controlled. Was able to tolerate orals within the first 24 hours of admission. Test came back with positive for MONONUCLEOSIS, U/S positive for Splenomegaly and differential with Lymphocytosis and atypical Lymphocytes on smear.   2. Pre-Diabetes: Normal A1C. We covered it was SSI but was no need of insuline administration. 3. HTN: Controlled on his home meds. 4. Transaminitis: They are trending down. This could be secondary to mononucleosis infection and/ or hepatic steatosis (metabolic syndrome) The acuity favors viral process but this should be f/u as outpatient when he is ready to be D/C.  Hepatitis panel negative. 5. Racing Heart/Sensation of  Elevated Temp: telemetry with no alarms overnight. Mildly febrile with good response to antipyretics .EKG with NSR. Normal TSH. This could be explained by the viral process.   Patient condition at time of discharge/disposition:  Patient is discharge home on stable medical condition.    Discharge follow up:  Discharge Orders    Future Appointments: Provider: Department: Dept Phone: Center:   07/17/2011 8:30 AM Georgian Co, PA [facility] [phone] [facility]     Future Orders Please Complete By Expires   Diet - low sodium heart healthy      Increase activity slowly         D. Piloto Rolene Arbour, MD  [facility] 07/05/2011

## 2011-07-06 LAB — GLUCOSE, CAPILLARY: Glucose-Capillary: 101 mg/dL — ABNORMAL HIGH (ref 70–99)

## 2011-07-06 LAB — PATHOLOGIST SMEAR REVIEW: Tech Review: REACTIVE

## 2011-07-07 DIAGNOSIS — Z20828 Contact with and (suspected) exposure to other viral communicable diseases: Secondary | ICD-10-CM

## 2011-07-07 HISTORY — DX: Contact with and (suspected) exposure to other viral communicable diseases: Z20.828

## 2011-07-17 ENCOUNTER — Encounter: Payer: Managed Care, Other (non HMO) | Admitting: Physician Assistant

## 2011-07-22 ENCOUNTER — Encounter: Payer: Self-pay | Admitting: Family Medicine

## 2011-07-22 DIAGNOSIS — F909 Attention-deficit hyperactivity disorder, unspecified type: Secondary | ICD-10-CM

## 2011-07-22 DIAGNOSIS — F988 Other specified behavioral and emotional disorders with onset usually occurring in childhood and adolescence: Secondary | ICD-10-CM | POA: Insufficient documentation

## 2011-07-22 DIAGNOSIS — E669 Obesity, unspecified: Secondary | ICD-10-CM

## 2011-07-26 ENCOUNTER — Emergency Department (HOSPITAL_COMMUNITY): Payer: Managed Care, Other (non HMO)

## 2011-07-26 ENCOUNTER — Inpatient Hospital Stay (HOSPITAL_COMMUNITY)
Admission: EM | Admit: 2011-07-26 | Discharge: 2011-07-28 | DRG: 392 | Disposition: A | Discharge status: Home / Self Care | Payer: Managed Care, Other (non HMO) | Source: Ambulatory Visit | Attending: Family Medicine | Admitting: Family Medicine

## 2011-07-26 ENCOUNTER — Encounter (HOSPITAL_COMMUNITY): Payer: Self-pay | Admitting: *Deleted

## 2011-07-26 ENCOUNTER — Other Ambulatory Visit: Payer: Self-pay

## 2011-07-26 DIAGNOSIS — K219 Gastro-esophageal reflux disease without esophagitis: Secondary | ICD-10-CM

## 2011-07-26 DIAGNOSIS — R109 Unspecified abdominal pain: Secondary | ICD-10-CM

## 2011-07-26 DIAGNOSIS — G4730 Sleep apnea, unspecified: Secondary | ICD-10-CM | POA: Diagnosis present

## 2011-07-26 DIAGNOSIS — E119 Type 2 diabetes mellitus without complications: Secondary | ICD-10-CM | POA: Diagnosis present

## 2011-07-26 DIAGNOSIS — R161 Splenomegaly, not elsewhere classified: Secondary | ICD-10-CM

## 2011-07-26 DIAGNOSIS — I10 Essential (primary) hypertension: Secondary | ICD-10-CM

## 2011-07-26 DIAGNOSIS — R1012 Left upper quadrant pain: Secondary | ICD-10-CM | POA: Diagnosis present

## 2011-07-26 DIAGNOSIS — Z882 Allergy status to sulfonamides status: Secondary | ICD-10-CM

## 2011-07-26 DIAGNOSIS — Z87891 Personal history of nicotine dependence: Secondary | ICD-10-CM

## 2011-07-26 DIAGNOSIS — K8020 Calculus of gallbladder without cholecystitis without obstruction: Secondary | ICD-10-CM

## 2011-07-26 DIAGNOSIS — Z8673 Personal history of transient ischemic attack (TIA), and cerebral infarction without residual deficits: Secondary | ICD-10-CM

## 2011-07-26 DIAGNOSIS — Z888 Allergy status to other drugs, medicaments and biological substances status: Secondary | ICD-10-CM

## 2011-07-26 DIAGNOSIS — R079 Chest pain, unspecified: Secondary | ICD-10-CM | POA: Diagnosis present

## 2011-07-26 DIAGNOSIS — R7989 Other specified abnormal findings of blood chemistry: Secondary | ICD-10-CM

## 2011-07-26 DIAGNOSIS — Z880 Allergy status to penicillin: Secondary | ICD-10-CM

## 2011-07-26 DIAGNOSIS — G35 Multiple sclerosis: Secondary | ICD-10-CM | POA: Diagnosis present

## 2011-07-26 DIAGNOSIS — R0789 Other chest pain: Secondary | ICD-10-CM | POA: Diagnosis present

## 2011-07-26 HISTORY — DX: Other specified acute viral hepatitis: B17.8

## 2011-07-26 HISTORY — DX: Infectious mononucleosis, unspecified with other complication: B27.99

## 2011-07-26 LAB — CBC
HCT: 46.8 % (ref 39.0–52.0)
Hemoglobin: 15.9 g/dL (ref 13.0–17.0)
MCH: 27.5 pg (ref 26.0–34.0)
MCHC: 34 g/dL (ref 30.0–36.0)
MCV: 80.8 fL (ref 78.0–100.0)
Platelets: 144 10*3/uL — ABNORMAL LOW (ref 150–400)
RBC: 5.79 MIL/uL (ref 4.22–5.81)
RDW: 14.3 % (ref 11.5–15.5)
WBC: 7.6 10*3/uL (ref 4.0–10.5)

## 2011-07-26 LAB — DIFFERENTIAL
Basophils Absolute: 0 10*3/uL (ref 0.0–0.1)
Basophils Relative: 0 % (ref 0–1)
Eosinophils Absolute: 0 10*3/uL (ref 0.0–0.7)
Eosinophils Relative: 0 % (ref 0–5)
Lymphocytes Relative: 25 % (ref 12–46)
Lymphs Abs: 1.9 10*3/uL (ref 0.7–4.0)
Monocytes Absolute: 0.4 10*3/uL (ref 0.1–1.0)
Monocytes Relative: 5 % (ref 3–12)
Neutro Abs: 5.2 10*3/uL (ref 1.7–7.7)
Neutrophils Relative %: 69 % (ref 43–77)

## 2011-07-26 LAB — COMPREHENSIVE METABOLIC PANEL
ALT: 75 U/L — ABNORMAL HIGH (ref 0–53)
AST: 30 U/L (ref 0–37)
Albumin: 4.5 g/dL (ref 3.5–5.2)
Alkaline Phosphatase: 82 U/L (ref 39–117)
BUN: 15 mg/dL (ref 6–23)
CO2: 25 mEq/L (ref 19–32)
Calcium: 10.7 mg/dL — ABNORMAL HIGH (ref 8.4–10.5)
Chloride: 102 mEq/L (ref 96–112)
Creatinine, Ser: 0.93 mg/dL (ref 0.50–1.35)
GFR calc Af Amer: >90 mL/min (ref >90)
GFR calc non Af Amer: >90 mL/min (ref >90)
Glucose, Bld: 151 mg/dL — ABNORMAL HIGH (ref 70–99)
Potassium: 3.6 mEq/L (ref 3.5–5.1)
Sodium: 139 mEq/L (ref 135–145)
Total Bilirubin: 0.4 mg/dL (ref 0.3–1.2)
Total Protein: 8.3 g/dL (ref 6.0–8.3)

## 2011-07-26 LAB — GLUCOSE, CAPILLARY
Glucose-Capillary: 141 mg/dL — ABNORMAL HIGH (ref 70–99)
Glucose-Capillary: 90 mg/dL (ref 70–99)
Glucose-Capillary: 96 mg/dL (ref 70–99)

## 2011-07-26 LAB — CARDIAC PANEL(CRET KIN+CKTOT+MB+TROPI)
CK, MB: 1.9 ng/mL (ref 0.3–4.0)
Relative Index: 1.9 (ref 0.0–2.5)
Total CK: 101 U/L (ref 7–232)
Troponin I: <0.3 ng/mL (ref <0.30)

## 2011-07-26 LAB — LACTIC ACID, PLASMA: Lactic Acid, Venous: 2 mmol/L (ref 0.5–2.2)

## 2011-07-26 LAB — LIPASE, BLOOD: Lipase: 37 U/L (ref 11–59)

## 2011-07-26 LAB — POCT I-STAT TROPONIN I: Troponin i, poc: 0 ng/mL (ref 0.00–0.08)

## 2011-07-26 MED ORDER — HYDROMORPHONE HCL PF 1 MG/ML IJ SOLN
1.0000 mg | Freq: Once | INTRAMUSCULAR | Status: AC
  Administered 2011-07-26: 1 mg via INTRAVENOUS
  Filled 2011-07-26: qty 1

## 2011-07-26 MED ORDER — ONDANSETRON HCL 4 MG PO TABS: 4.0000 mg | ORAL_TABLET | Freq: Four times a day (QID) | ORAL | Status: DC | PRN

## 2011-07-26 MED ORDER — LISINOPRIL 10 MG PO TABS
10.0000 mg | ORAL_TABLET | Freq: Every day | ORAL | Status: DC
  Administered 2011-07-27 – 2011-07-28 (×2): 10 mg via ORAL
  Filled 2011-07-26 (×2): qty 1

## 2011-07-26 MED ORDER — ASPIRIN EC 81 MG PO TBEC
81.0000 mg | DELAYED_RELEASE_TABLET | Freq: Every day | ORAL | Status: DC
  Administered 2011-07-27 – 2011-07-28 (×2): 81 mg via ORAL
  Filled 2011-07-26 (×2): qty 1

## 2011-07-26 MED ORDER — ONDANSETRON HCL 4 MG/2ML IJ SOLN
8.0000 mg | Freq: Once | INTRAMUSCULAR | Status: AC
  Administered 2011-07-26: 8 mg via INTRAVENOUS
  Filled 2011-07-26 (×2): qty 2

## 2011-07-26 MED ORDER — ONDANSETRON HCL 4 MG/2ML IJ SOLN
4.0000 mg | Freq: Four times a day (QID) | INTRAMUSCULAR | Status: DC | PRN
  Administered 2011-07-28: 4 mg via INTRAVENOUS
  Filled 2011-07-26: qty 2

## 2011-07-26 MED ORDER — HEPARIN SODIUM (PORCINE) 5000 UNIT/ML IJ SOLN
5000.0000 [IU] | Freq: Three times a day (TID) | INTRAMUSCULAR | Status: DC
  Administered 2011-07-27 (×2): 5000 [IU] via SUBCUTANEOUS
  Filled 2011-07-26 (×5): qty 1

## 2011-07-26 MED ORDER — LISINOPRIL-HYDROCHLOROTHIAZIDE 10-12.5 MG PO TABS: 1.0000 | ORAL_TABLET | Freq: Every day | ORAL | Status: DC

## 2011-07-26 MED ORDER — INSULIN ASPART 100 UNIT/ML ~~LOC~~ SOLN: 0.0000 [IU] | Freq: Every day | SUBCUTANEOUS | Status: DC

## 2011-07-26 MED ORDER — GI COCKTAIL ~~LOC~~
30.0000 mL | Freq: Once | ORAL | Status: AC
  Administered 2011-07-26: 30 mL via ORAL
  Filled 2011-07-26: qty 30

## 2011-07-26 MED ORDER — SIMETHICONE 80 MG PO CHEW
80.0000 mg | CHEWABLE_TABLET | Freq: Four times a day (QID) | ORAL | Status: DC | PRN
  Administered 2011-07-28: 80 mg via ORAL
  Filled 2011-07-26 (×2): qty 1

## 2011-07-26 MED ORDER — HYDROMORPHONE HCL PF 1 MG/ML IJ SOLN
1.0000 mg | INTRAMUSCULAR | Status: DC | PRN
  Administered 2011-07-26 – 2011-07-28 (×11): 1 mg via INTRAVENOUS
  Filled 2011-07-26 (×12): qty 1

## 2011-07-26 MED ORDER — HYDROMORPHONE HCL PF 2 MG/ML IJ SOLN
2.0000 mg | Freq: Once | INTRAMUSCULAR | Status: AC
  Administered 2011-07-26: 2 mg via INTRAVENOUS
  Filled 2011-07-26: qty 1

## 2011-07-26 MED ORDER — SODIUM CHLORIDE 0.9 % IV BOLUS (SEPSIS)
1000.0000 mL | Freq: Once | INTRAVENOUS | Status: AC
  Administered 2011-07-26: 1000 mL via INTRAVENOUS

## 2011-07-26 MED ORDER — POLYETHYLENE GLYCOL 3350 17 G PO PACK
17.0000 g | PACK | Freq: Every day | ORAL | Status: DC | PRN
  Administered 2011-07-28: 17 g via ORAL
  Filled 2011-07-26 (×2): qty 1

## 2011-07-26 MED ORDER — FENTANYL CITRATE 0.05 MG/ML IJ SOLN
100.0000 ug | Freq: Once | INTRAMUSCULAR | Status: AC
  Administered 2011-07-26: 100 ug via INTRAVENOUS
  Filled 2011-07-26: qty 2

## 2011-07-26 MED ORDER — INSULIN ASPART 100 UNIT/ML ~~LOC~~ SOLN: 0.0000 [IU] | Freq: Three times a day (TID) | SUBCUTANEOUS | Status: DC

## 2011-07-26 MED ORDER — SODIUM CHLORIDE 0.9 % IV SOLN
INTRAVENOUS | Status: DC
  Administered 2011-07-27: 02:00:00 via INTRAVENOUS

## 2011-07-26 MED ORDER — HYDROCHLOROTHIAZIDE 12.5 MG PO CAPS
12.5000 mg | ORAL_CAPSULE | Freq: Every day | ORAL | Status: DC
  Administered 2011-07-27 – 2011-07-28 (×2): 12.5 mg via ORAL
  Filled 2011-07-26 (×2): qty 1

## 2011-07-26 MED ORDER — IOHEXOL 300 MG/ML  SOLN
100.0000 mL | Freq: Once | INTRAMUSCULAR | Status: AC | PRN
  Administered 2011-07-26: 100 mL via INTRAVENOUS

## 2011-07-26 NOTE — ED Provider Notes (Signed)
Medical screening examination/treatment/procedure(s) were performed by non-physician practitioner and as supervising physician I was immediately available for consultation/collaboration.   Dione Booze, MD 07/26/11 2337

## 2011-07-26 NOTE — ED Notes (Signed)
Pt states 10/10 chest and abdominal pain. He is diaphoretic, cool and clammy. Unable to sit still, moaning in pain. Noted to be bradycardic on monitor. Provider notified.

## 2011-07-26 NOTE — ED Notes (Signed)
Reports having abd pain and n/v that started this am, now having active chest pain and diaphoretic at triage.

## 2011-07-26 NOTE — ED Notes (Signed)
Pt resting quietly at the time. States he feels better after receiving pain medication. 7/10 chest and abdominal pain at the time. Vital signs stable. Family remains at bedside.

## 2011-07-26 NOTE — H&P (Signed)
Brandon [name] is an 41 y.o. male.  PGY-1 Daily Progress Note Family Medicine Teaching Service Amber M. Hairford, MD Service Pager: [phone]    Chief Complaint: Abdominal pain, vomiting  HPI: Patient presenting with acute onset abdominal pain at 8:00 am this morning. Patient forced himself to vomit because he thought it would make his pain better, but it made pain worse. After vomiting, patient started having CP. He checked BP at that time with home monitoring kit, and he states BP stable but he noted his HR was dropping. Patient describes his abdominal pain as if he feels like "being punched" and having "burning" pain mostly in left upper quadrant. He states his pain was 9/10 on arrival to ED but down to 5/10 on pain medication. When asked about his chest pain patient states the pain is in middle of chest, non-radiating. Feels like he is punched in chest. Patient states he tried Gas-X and Tums (4 tabs total) today for pain with no relief.  He reports no changes in bowel movements. He states his bowel movements are always irregular, but has not noted any blood. Before today his appetite was good. Now he has decreased appetite but is very thirsty. Denies fevers but states his temp was low at 95 degrees F at home this morning. No chills, but is having sweats secondary to pain. SOB worse with deep inspiration. No dysuria, but does report he has difficulty urinating secondary to dehydration recently. No coughing.   Of note, patient was admitted 3 weeks ago for elevated BP and sweats, he was diagnosed with mononucleosis. He had abdominal workup then which showed one gallstone, liver steatosis and splenomegaly but no acute abnormality.   Patient also has a history of MS, and is followed by Neurology. Not currently on any treatment but is closely followed. Has been fully worked up with MRI, LP, etc. Symptoms include decreased memory, gait abnormality with right leg drag and spasms.  ED Course: Vital signs  stable. WBC wnl. POCT troponin negative. Lactic acid normal. Patient had CT scan which showed no acute abnormality, but a single gall stone. No evidence of splenomegaly seen on CT. Surgery was consulted and stated nothing acute that requires surgery. GI was consulted as well, and states they will follow along with [place]. Family Medicine was called for admission.   Past Medical History  Diagnosis Date  . Hypertension   . MS (multiple sclerosis)   . Sleep apnea   . Pneumonia 2010  . Hyperinsulinemia 07/04/11    "I'm on metformin cause I produce too much insulin"  . Headache   . TIA (transient ischemic attack) 2000    denies residual  . Stroke   . Pre-diabetes   . Depression 2005  . Anxiety 2005  . Diabetes mellitus     Past Surgical History  Procedure Date  . Tonsillectomy and adenoidectomy ~ 1980  . Myringotomy 1979; 1980    bilaterally    Family History  Problem Relation Age of Onset  . Coronary artery disease Father   . Stroke Father   . Diabetes Mother   Estranged from both parents, not sure of full medical history.   Social History: Lives with Brandon [name] (live in partner). On disability, not employed. Has not smoked since age 51. No recent EtOH in a few weeks, but drank socially before. Very rarely uses Marijuana.   Allergies:  Allergies  Allergen Reactions  . Sulfa Antibiotics Other (See Comments)    It will kill him  .  Cephalosporins Other (See Comments)    Reaction unspecified.  [name] Penicillins Other (See Comments)    fever  . Quinolones Other (See Comments)    Reaction unspecified   Prior to Admission medications   Medication Sig Start Date End Date Taking? Authorizing Provider  aspirin EC 81 MG tablet Take 81 mg by mouth daily.     Yes Historical Provider, MD  calcium carbonate (TUMS - DOSED IN MG ELEMENTAL CALCIUM) 500 MG chewable tablet Chew 1 tablet by mouth daily as needed. For upset stomach   Yes Historical Provider, MD  fish oil-omega-3 fatty acids 1000  MG capsule Take 2 g by mouth daily.     Yes Historical Provider, MD  lisinopril-hydrochlorothiazide (PRINZIDE,ZESTORETIC) 10-12.5 MG per tablet Take 1 tablet by mouth daily.     Yes Historical Provider, MD  metFORMIN (GLUCOPHAGE) 500 MG tablet Take 1,000 mg by mouth 2 (two) times daily with a meal.     Yes Historical Provider, MD  naproxen sodium (ANAPROX) 220 MG tablet Take 440 mg by mouth daily as needed. For pain   Yes Historical Provider, MD  Simethicone (GAS-X PO) Take 1 tablet by mouth daily as needed. For gas relieve 07/22/11  Yes Historical Provider, MD   Results for orders placed during the hospital encounter of 07/26/11 (from the past 48 hour(s))  CBC     Status: Abnormal   Collection Time   07/26/11  1:31 PM      Component Value Range Comment   WBC 7.6  4.0 - 10.5 (K/uL)    RBC 5.79  4.22 - 5.81 (MIL/uL)    Hemoglobin 15.9  13.0 - 17.0 (g/dL)    HCT 16.1  09.6 - 04.5 (%)    MCV 80.8  78.0 - 100.0 (fL)    MCH 27.5  26.0 - 34.0 (pg)    MCHC 34.0  30.0 - 36.0 (g/dL)    RDW 40.9  81.1 - 91.4 (%)    Platelets 144 (*) 150 - 400 (K/uL)   DIFFERENTIAL     Status: Normal   Collection Time   07/26/11  1:31 PM      Component Value Range Comment   Neutrophils Relative 69  43 - 77 (%)    Neutro Abs 5.2  1.7 - 7.7 (K/uL)    Lymphocytes Relative 25  12 - 46 (%)    Lymphs Abs 1.9  0.7 - 4.0 (K/uL)    Monocytes Relative 5  3 - 12 (%)    Monocytes Absolute 0.4  0.1 - 1.0 (K/uL)    Eosinophils Relative 0  0 - 5 (%)    Eosinophils Absolute 0.0  0.0 - 0.7 (K/uL)    Basophils Relative 0  0 - 1 (%)    Basophils Absolute 0.0  0.0 - 0.1 (K/uL)   COMPREHENSIVE METABOLIC PANEL     Status: Abnormal   Collection Time   07/26/11  1:31 PM      Component Value Range Comment   Sodium 139  135 - 145 (mEq/L)    Potassium 3.6  3.5 - 5.1 (mEq/L)    Chloride 102  96 - 112 (mEq/L)    CO2 25  19 - 32 (mEq/L)    Glucose, Bld 151 (*) 70 - 99 (mg/dL)    BUN 15  6 - 23 (mg/dL)    Creatinine, Ser 7.82  0.50 -  1.35 (mg/dL)    Calcium 95.6 (*) 8.4 - 10.5 (mg/dL)    Total Protein 8.3  6.0 - 8.3 (g/dL)    Albumin 4.5  3.5 - 5.2 (g/dL)    AST 30  0 - 37 (U/L) HEMOLYSIS AT THIS LEVEL MAY AFFECT RESULT   ALT 75 (*) 0 - 53 (U/L)    Alkaline Phosphatase 82  39 - 117 (U/L)    Total Bilirubin 0.4  0.3 - 1.2 (mg/dL)    GFR calc non Af Amer >90  >90 (mL/min)    GFR calc Af Amer >90  >90 (mL/min)   LIPASE, BLOOD     Status: Normal   Collection Time   07/26/11  1:31 PM      Component Value Range Comment   Lipase 37  11 - 59 (U/L)   GLUCOSE, CAPILLARY     Status: Abnormal   Collection Time   07/26/11  1:48 PM      Component Value Range Comment   Glucose-Capillary 141 (*) 70 - 99 (mg/dL)    Comment 1 Documented in Chart      Comment 2 Notify RN     POCT I-STAT TROPONIN I     Status: Normal   Collection Time   07/26/11  1:55 PM      Component Value Range Comment   Troponin i, poc 0.00  0.00 - 0.08 (ng/mL)    Comment 3            LACTIC ACID, PLASMA     Status: Normal   Collection Time   07/26/11  3:08 PM      Component Value Range Comment   Lactic Acid, Venous 2.0  0.5 - 2.2 (mmol/L)    Ct Abdomen Pelvis W Contrast 07/26/2011  Comparison: Abdominal ultrasound 07/03/2011.  Findings: Mild atelectasis is seen in the left lung base.  Imaged lung parenchyma is otherwise clear.  No pleural or pericardial effusion.  A single stone is seen in the neck of the gallbladder.  No pericholecystic inflammatory change is identified.  The spleen is unremarkable.  The liver is low attenuating consistent with fatty change.  No focal liver lesion.  The biliary tree, adrenal glands, pancreas and kidneys appear normal.  Seminal vesicles, prostate gland urinary bladder are unremarkable.  The stomach, small and large bowel and appendix all appear normal.  The patient has a unilateral pars interarticularis defects on the right at L5.  No anterolisthesis.  No other focal bony abnormality.  IMPRESSION:  1.  No acute finding. 2.  Fatty  infiltration of the liver. 3.  Unilateral pars interarticularis defect on the right at L5.  No anterolisthesis. 4.  Single gallstone is identified.  No evidence of cholecystitis.   Dg Chest Port 1 View 07/26/2011  Findings: Cardiac leads overlie the chest.  Heart size is normal. Lung volumes are low but clear.  No pleural effusion.  No acute osseous finding.  IMPRESSION: Normal chest.    ROS Please see HPI. Otherwise, ROS negative.  Blood pressure 143/77, pulse 80, temperature 98.2 F (36.8 C), temperature source Oral, resp. rate 20, SpO2 96.00%. Physical Exam  Constitutional: He appears well-developed and well-nourished. He appears distressed (Intermittent distress.).       Patient very talkative.  HENT:  Head: Normocephalic and atraumatic.  Eyes: Pupils are equal, round, and reactive to light.  Cardiovascular: Normal rate and regular rhythm.   No murmur heard. Respiratory: Effort normal and breath sounds normal. He has no wheezes. He exhibits tenderness.       Some dyspnea after talking extensively, but otherwise no respiratory distress  GI: There is tenderness in the left upper quadrant.       Patient with obese abdomen, exquisitely tender to any palpation. Most tender in right upper quadrant. Unable to evaluate spleen and liver secondary to pain. No rashes noted on abdomen.  Musculoskeletal: He exhibits no edema.  Lymphadenopathy:    He has no cervical adenopathy.  Neurological: He is alert.  Skin: Skin is warm and dry. No rash noted.     Assessment/Plan 41 yo M presenting with acute onset abdominal pain 1. Abdominal pain: Worse in RUQ. Patient with recently history of mono with splenomegaly on abd [place] on 07/03/11. Abd CT today did not reveal splenomegaly. Differential diagnosis includes increased splenomegaly, cholelithiasis, gaseous distention, and lower on differential gastroenteritis, manifestation of MS or ischemia. - Admit to [facility] medicine teaching service - Will order  abdominal ultrasound to evaluate spleen, since it was last evaluated with ultrasound. - Will give Dilaudid 1mg  prn for pain - WBC, lactic acid and procalcitonin all within normal limits.  - Surgery has evaluated and no need for surgery at this time. GI will continue to follow. (Will put in a new consult order tomorrow if they have not rounded on patient) - Will keep patient NPO for now and start IVF - Admit to telemetry bed and continue to monitor for any changes  2. Chest pain: POCT troponin negative. Patient began having chest pain after vomiting. Does not appear to be typical for cardiac. Received GI cocktail in the ED as well as Dilaudid with some relief. - Will cycle cardiac enzymes to rule out cardiac cause - EKG in AM - Monitor on telemetry  3. DM: On Metformin at home. CBG elevated at admission. - Holding Metformin. Patient received contrast CT scan, therefore this will not be restarted. - Sensitive SSI and CBG checks qac/qhs  4. HTN: On Lisinopril HCTZ at home. States his BP has been stable at home. - Continue home medication. - Continue to monitor BP and HR  5. Hypercalcemia: Calcium on admission elevated to 10.7. Patient states he took 4 Tums today. Calcium at last admission was 8. - Recheck Calcium in the morning. - Mag and Phos with AM labs - Will check UA for protein.   6. MS: Not currently on any therapy. Followed by Neurology. Current chief complaint not evidently related to MS, therefore this will not be addressed during admission. - PT/OT consults prior to discharge given his weakness and gait abnormality.  7. FEN/GI: NPO. On Protonix. Will advance diet after he is seen by GI, and once he can tolerate.  8. PPx: Heparin SQ  9. Dispo: Pending overall clinical improvement.  HAIRFORD, AMBER 07/26/2011, 7:37 PM  PGY2 H&P Addendum  I have seen and examined this patient with Dr. Mikel Cella and agree with her assessment.   Briefly, Brandon [name] is a 41 year old male withPMHx  of DM, obesity, and recent EBV infection with splenomegaly who presents today with severe abdominal pain and intractable vomiting:   PE:  Filed Vitals:   07/26/11 2000  BP: 126/64  Pulse: 48  Temp:   Resp: 17   Gen: alert, talkative, in distress intermittently during history and exam Head: normocepalic and atraumatic.  Eyes:  PERRL, EOMIT.  Mouth: Oral mucosa moist, no lesions or exudates, poor dentition.  CV: RRR, 2+ peripheral pulses, sternum tender to palpation Pulm: CTAB ABD: +BS, soft.  Pt has tenderness to palpation, particularly in left upper quadrant, he jumps just at touching his skin in  that area.  He has no rebound tenderness.  Due to severe pain, difficult to assess for organomegaly.  Extremities: warm and well perfused.   A/P: 41 year old man with recent EBV infection with splenomegaly, who presents with abdominal pain, nausea vomiting, only abnormality found on labs and imaging is hypercalcemia:  1) Abdominal pain- unclear etiology at this time, CT scan with no abnormalities except one gall stone, and patients symptoms not consistent with gall bladder pain.  He did have splenomegaly during his recent illness, and has LUQ pain. His spleen does measure large on today's CT scan, but this is not commented on in the report.  I am concerned that his spleen may have gotten larger, which is causing the current symptoms.  It is difficult to compare the spleen size of last admissions abdominal US and today's Abdominal CT scan.  Will order abdominal US to have accurate comparison.  Pt will remain NPO, and we will control his pain with IV dilaudid, nausea with zoran as needed  Will give IV protnix to treat if this is reflux symptoms.  General surgery and GI are following and we appreciate their input.  2) Chest pain- feel this is likely related to abdominal pain, and is atypical and non-cardiac.  Will cycle cardiac enzymes to rule out for ACS.  3) Hypercalcemia- again, unsure of cause.  It is  only mildly elevated, and patient has been taking tums at home.  Will repeat in the am, and consider further work up if still present.  4) DM- will place on SSI.  5) HTN- continue home medications 6) FEN- Will give MIVF, pt NPO 7) DVT PPX- SQ Heparin 8) Disposition- pending work up and clinical improvement.   Brandon [name] 07/26/2011 9:05 PM

## 2011-07-26 NOTE — ED Notes (Signed)
Called [phone]. RN in contact room and will call back . Will continue to monitor.

## 2011-07-26 NOTE — Consult Note (Signed)
Reason for Consult: Abdominal pain. Referring Physician:  Dr. Preston [name] of the ED at [facility] Brandon [name] is an 41 y.o. male.  HPI:  Brandon [name] is a 41 year old male who developed lower chest pain around 8 AM, then generalized abdominal pain around 10 AM.  He came to the ED.  His pain is now localized in the LUQ and chest.  The pain is worse when he moves or takes a deep breath.  Narcotics help the pain, but do not make it go completely away.  He had an episode of emesis that he associates with a severe attack of pain.  He feels like he has been punched in the stomach repeatedly, or that there is a hot burning sensation in his upper abdomen.  He had a CT today that was positive for a single gallstone, but negative for cholecystitis.   He was seen in the ED at the end of last month for abdominal pain as well.  At that point he had generalized abdominal pain that was "more like a really bad gas pain." At that point, the gallstone was diagnosed, but he also had hepatitis and splenomegaly and was found to have mononucleosis.  He had an arrhythmia at that point as well.   He also has MS.    Past Medical History  Diagnosis Date  . Hypertension   . MS (multiple sclerosis)   . Sleep apnea   . Pneumonia 2010  . Hyperinsulinemia 07/04/11    "I'm on metformin cause I produce too much insulin"  . Headache   . TIA (transient ischemic attack) 2000    denies residual  . Stroke   . Pre-diabetes   . Depression 2005  . Anxiety 2005  . Diabetes mellitus     Past Surgical History  Procedure Date  . Tonsillectomy and adenoidectomy ~ 1980  . Myringotomy 1979; 1980    bilaterally    Family History  Problem Relation Age of Onset  . Coronary artery disease Father   . Stroke Father   . Diabetes Mother     Social History:  reports that he quit smoking about 24 years ago. His smoking use included Cigarettes. He has a 9 pack-year smoking history. He has never used smokeless tobacco. He reports that he uses illicit  drugs (Marijuana). He reports that he does not drink alcohol.  Allergies:  Allergies  Allergen Reactions  . Sulfa Antibiotics Other (See Comments)    It will kill him  . Cephalosporins Other (See Comments)    Reaction unspecified.  Brandon [name] Kitchen Penicillins Other (See Comments)    fever  . Quinolones Other (See Comments)    Reaction unspecified    Medications Prescriptions Show Facility-Administered Medications   aspirin EC 81 MG tablet  calcium carbonate (TUMS - DOSED IN MG ELEMENTAL CALCIUM) 500 MG chewable tablet  fish oil-omega-3 fatty acids 1000 MG capsule  lisinopril-hydrochlorothiazide (PRINZIDE,ZESTORETIC) 10-12.5 MG per tablet  metFORMIN (GLUCOPHAGE) 500 MG tablet  naproxen sodium (ANAPROX) 220 MG tablet  Simethicone (GAS-X PO)     Results for orders placed during the hospital encounter of 07/26/11 (from the past 48 hour(s))  CBC     Status: Abnormal   Collection Time   07/26/11  1:31 PM      Component Value Range Comment   WBC 7.6  4.0 - 10.5 (K/uL)    RBC 5.79  4.22 - 5.81 (MIL/uL)    Hemoglobin 15.9  13.0 - 17.0 (g/dL)    HCT 46.8  39.0 - 52.0 (%)    MCV 80.8  78.0 - 100.0 (fL)    MCH 27.5  26.0 - 34.0 (pg)    MCHC 34.0  30.0 - 36.0 (g/dL)    RDW 21.3  08.6 - 57.8 (%)    Platelets 144 (*) 150 - 400 (K/uL)   DIFFERENTIAL     Status: Normal   Collection Time   07/26/11  1:31 PM      Component Value Range Comment   Neutrophils Relative 69  43 - 77 (%)    Neutro Abs 5.2  1.7 - 7.7 (K/uL)    Lymphocytes Relative 25  12 - 46 (%)    Lymphs Abs 1.9  0.7 - 4.0 (K/uL)    Monocytes Relative 5  3 - 12 (%)    Monocytes Absolute 0.4  0.1 - 1.0 (K/uL)    Eosinophils Relative 0  0 - 5 (%)    Eosinophils Absolute 0.0  0.0 - 0.7 (K/uL)    Basophils Relative 0  0 - 1 (%)    Basophils Absolute 0.0  0.0 - 0.1 (K/uL)   COMPREHENSIVE METABOLIC PANEL     Status: Abnormal   Collection Time   07/26/11  1:31 PM      Component Value Range Comment   Sodium 139  135 - 145 (mEq/L)     Potassium 3.6  3.5 - 5.1 (mEq/L)    Chloride 102  96 - 112 (mEq/L)    CO2 25  19 - 32 (mEq/L)    Glucose, Bld 151 (*) 70 - 99 (mg/dL)    BUN 15  6 - 23 (mg/dL)    Creatinine, Ser 4.69  0.50 - 1.35 (mg/dL)    Calcium 62.9 (*) 8.4 - 10.5 (mg/dL)    Total Protein 8.3  6.0 - 8.3 (g/dL)    Albumin 4.5  3.5 - 5.2 (g/dL)    AST 30  0 - 37 (U/L) HEMOLYSIS AT THIS LEVEL MAY AFFECT RESULT   ALT 75 (*) 0 - 53 (U/L)    Alkaline Phosphatase 82  39 - 117 (U/L)    Total Bilirubin 0.4  0.3 - 1.2 (mg/dL)    GFR calc non Af Amer >90  >90 (mL/min)    GFR calc Af Amer >90  >90 (mL/min)   LIPASE, BLOOD     Status: Normal   Collection Time   07/26/11  1:31 PM      Component Value Range Comment   Lipase 37  11 - 59 (U/L)   GLUCOSE, CAPILLARY     Status: Abnormal   Collection Time   07/26/11  1:48 PM      Component Value Range Comment   Glucose-Capillary 141 (*) 70 - 99 (mg/dL)    Comment 1 Documented in Chart      Comment 2 Notify RN     POCT I-STAT TROPONIN I     Status: Normal   Collection Time   07/26/11  1:55 PM      Component Value Range Comment   Troponin i, poc 0.00  0.00 - 0.08 (ng/mL)    Comment 3            LACTIC ACID, PLASMA     Status: Normal   Collection Time   07/26/11  3:08 PM      Component Value Range Comment   Lactic Acid, Venous 2.0  0.5 - 2.2 (mmol/L)     Ct Abdomen Pelvis W Contrast  07/26/2011  *RADIOLOGY  REPORT*  Clinical Data: Abdominal pain.  CT ABDOMEN AND PELVIS WITH CONTRAST  Technique:  Multidetector CT imaging of the abdomen and pelvis was performed following the standard protocol during bolus administration of intravenous contrast.  Contrast: OMNIPAQUE IOHEXOL 300 MG/ML IV SOLN  Comparison: Abdominal ultrasound 07/03/2011.  Findings: Mild atelectasis is seen in the left lung base.  Imaged lung parenchyma is otherwise clear.  No pleural or pericardial effusion.  A single stone is seen in the neck of the gallbladder.  No pericholecystic inflammatory change is  identified.  The spleen is unremarkable.  The liver is low attenuating consistent with fatty change.  No focal liver lesion.  The biliary tree, adrenal glands, pancreas and kidneys appear normal.  Seminal vesicles, prostate gland urinary bladder are unremarkable.  The stomach, small and large bowel and appendix all appear normal.  The patient has a unilateral pars interarticularis defects on the right at L5.  No anterolisthesis.  No other focal bony abnormality.  IMPRESSION:  1.  No acute finding. 2.  Fatty infiltration of the liver. 3.  Unilateral pars interarticularis defect on the right at L5.  No anterolisthesis. 4.  Single gallstone is identified.  No evidence of cholecystitis.  Original Report Authenticated By: Bernadene Bell. Maricela Curet, M.D.   Dg Chest Port 1 View  07/26/2011  *RADIOLOGY REPORT*  Clinical Data: Substernal chest pain  PORTABLE CHEST - 1 VIEW  Comparison: 07/03/2011  Findings: Cardiac leads overlie the chest.  Heart size is normal. Lung volumes are low but clear.  No pleural effusion.  No acute osseous finding.  IMPRESSION: Normal chest.  Original Report Authenticated By: Harrel Lemon, M.D.    Review of Systems  Constitutional: Positive for chills and malaise/fatigue.  HENT: Negative.   Eyes: Negative.   Respiratory: Negative.   Cardiovascular: Positive for chest pain.  Gastrointestinal: Positive for heartburn, nausea, vomiting and abdominal pain.  Genitourinary: Negative.   Musculoskeletal: Negative.   Skin: Negative.   Neurological:       MS   Endo/Heme/Allergies: Negative.   Psychiatric/Behavioral: Negative.   All other systems reviewed and are negative.   Blood pressure 143/77, pulse 80, temperature 98.2 F (36.8 C), temperature source Oral, resp. rate 20, SpO2 96.00%. Physical Exam  Constitutional: He is oriented to person, place, and time. He appears well-developed and well-nourished. He appears distressed.  HENT:  Head: Normocephalic and atraumatic.  Eyes:  Conjunctivae are normal. Pupils are equal, round, and reactive to light. No scleral icterus.  Neck: Normal range of motion. Neck supple. No tracheal deviation present. No thyromegaly present.  Cardiovascular: Regular rhythm.        Bradycardic in high 40s.   Respiratory: Effort normal and breath sounds normal. No respiratory distress.  GI: Soft. Bowel sounds are normal. He exhibits no shifting dullness, no distension, no pulsatile liver, no abdominal bruit, no pulsatile midline mass and no mass. There is tenderness (focal LUQ tenderness) in the left upper quadrant. There is guarding (voluntary guarding in LUQ). There is no rigidity, no rebound, no CVA tenderness and negative Murphy's sign. No hernia.    Musculoskeletal: Normal range of motion. He exhibits no edema and no tenderness.  Lymphadenopathy:    He has no cervical adenopathy.  Neurological: He is alert and oriented to person, place, and time. Coordination normal.  Skin: Skin is warm and dry. No rash noted. No erythema. No pallor.  Psychiatric: He has a normal mood and affect. His behavior is normal. Judgment and thought content  normal.    Assessment/Plan: 1.  Cholelithiasis.  Certainly this can cause epigastric and chest discomfort, but rarely does this cause focal LUQ pain.  No evidence for cholecystitis or choledocholithiasis.    2.  LUQ pain.  Would eval for ulcer or gastritis.  Pt does not have significant alcohol history, but has taken a fair amount of NSAIDS.  No evidence for pancreatitis.    3.  Hypercalcemia.  Pt had calcium of 8.8 last time he was in the hospital 3-4 weeks ago.  Now it is elevated at 10.7.  Unclear if this is medication related or reflects pathology.  Hypercalcemia is also a cause of abdominal pain.    Surgical service will follow.  No role for acute surgical intervention tonight.    Vibhav Waddill 07/26/2011, 7:27 PM

## 2011-07-26 NOTE — ED Notes (Signed)
Pt came to ED complaining of mid-sternal and abdominal pain. Per pt, currently no pain in chest but has pain in his abdomen. Pain is 5 out of 10. Pt stated that he has been vomiting at home. Currently not n/v. Bowel sounds are hypoactive with abdomen tenderness to touch. Heart sounds are heard. Will continue to monitor. Notified pt to drink contrast and alert RN when complete. Will continue to monitor.

## 2011-07-26 NOTE — ED Provider Notes (Signed)
Pt report received from [facility], PA-C @ 1630.  Non insulin dependent Pt with hx of cholelithiasis presenting with abd pain.  Pt also complain of cp. He was found to be bradycardic in ED.  Plan to pain control, abd/pelvic CT.  If pain not well control, consider admission for pain management.  My attending is aware of plan.  5:17 PM Patient returned from CT scan and currently requesting for more pain medication. Patient is to upper abdomen in epigastric region and to RUQ. Patient said nausea has improved. His heart rates are in the 60s currently, however pt is alert and oriented.  Abdominal and pelvis CT scan shows no evidence of cholecystitis. 1 single gallstone noted. He has normal liver function and normal lipase, with normal WBC.  Will consult general surgery for further evaluation.   5:50 PM Dr.Byerly was consulted and agrees to see pt in ED.    7:12 PM Dr. Donell Beers has seen and evaluated the patient and also reviewed abdominal CT scan. She does not think that the pain is originating from the gallbladder. Pain is more localized to the left upper quadrant. Patient also had an elevated calcium of 10.7. Dr. Donell Beers recommend having patient admitted by [facility] and also had a GI consult for further management.  Patient is given GI cocktail and pain medication.  7:23 PM Both [facility] and GI has been consult to and agrees to admit the patient for further management. Patient's pain is currently controlled. His vital signs stable.  Fayrene Helper, PA-C 07/26/11 2035

## 2011-07-26 NOTE — ED Notes (Signed)
Informed CT that pt is complete with contrast.

## 2011-07-26 NOTE — ED Notes (Signed)
Gave Report to [facility] on 3700. Pt to be transported upstairs.

## 2011-07-26 NOTE — ED Notes (Addendum)
Pt presents to department for evaluation of midsternal chest and abdominal pain. Ongoing since this morning. Pt is diaphoretic and unable to sit still. Pt states 10/10 pain at the time. Also states nausea/vomiting earlier this morning. Pt yelling and moaning upon arrival to exam room. Nothing makes his pain better. Respirations unlabored. Lung sounds clear and equal bilaterally. Pt conscious and oriented x4.

## 2011-07-26 NOTE — ED Notes (Signed)
CBG - 141 ° °

## 2011-07-26 NOTE — ED Notes (Signed)
Portable xray performed at this time.  

## 2011-07-26 NOTE — ED Provider Notes (Signed)
History     CSN: [id number]  Arrival date & time 07/26/11  1234   First MD Initiated Contact with Patient 07/26/11 1310      Chief Complaint  Patient presents with  . Chest Pain  . Abdominal Pain    (Consider location/radiation/quality/duration/timing/severity/associated sxs/prior treatment) HPI  Patient presents to emergency department complaining of acute onset abdominal pain that began this morning stating that he felt nauseous and had severe abdominal pain and attempted to vomit to relieve the pain but upon vomiting states he had increased pain with severe pain since onset. Patient had multiple episodes of vomiting onset of abdominal pain and vomiting but states while in the emergency department the upper abdominal pain has begun to radiate into his lower chest. Patient denies aggravating or alleviating factors. Hasn't taken anything for pain prior to arrival. States pain was acute onset, severe, persistent and unchanging. Denies known fevers or chills. Patient states he was recently diagnosed with mono as well as elevated liver enzymes it was explained to be associated with his mono. Patient states he has non-insulin-dependent diabetes and high blood pressure this treated with lisinopril and that he is seen by the [place] urgent care for his primary care. Patient denies similar symptoms.  Past Medical History  Diagnosis Date  . Hypertension   . MS (multiple sclerosis)   . Sleep apnea   . Pneumonia 2010  . Hyperinsulinemia 07/04/11    "I'm on metformin cause I produce too much insulin"  . Headache   . TIA (transient ischemic attack) 2000    denies residual  . Stroke   . Pre-diabetes   . Depression 2005  . Anxiety 2005  . Diabetes mellitus     Past Surgical History  Procedure Date  . Tonsillectomy and adenoidectomy ~ 1980  . Myringotomy 1979; 1980    bilaterally    Family History  Problem Relation Age of Onset  . Coronary artery disease Father   . Stroke Father   .  Diabetes Mother     History  Substance Use Topics  . Smoking status: Former Smoker -- 3.0 packs/day for 3 years    Types: Cigarettes    Quit date: 07/07/1987  . Smokeless tobacco: Never Used  . Alcohol Use: No     07/04/11 "glass of wine or spirits once a month"      Review of Systems  All other systems reviewed and are negative.    Allergies  Sulfa antibiotics; Cephalosporins; Penicillins; and Quinolones  Home Medications   Current Outpatient Rx  Name Route Sig Dispense Refill  . ASPIRIN EC 81 MG PO TBEC Oral Take 81 mg by mouth daily.      [name] CALCIUM CARBONATE ANTACID 500 MG PO CHEW Oral Chew 1 tablet by mouth daily.    . OMEGA-3 FATTY ACIDS 1000 MG PO CAPS Oral Take 2 g by mouth daily.      [name] LISINOPRIL-HYDROCHLOROTHIAZIDE 10-12.5 MG PO TABS Oral Take 1 tablet by mouth daily.      [name] METFORMIN HCL 500 MG PO TABS Oral Take 1,000 mg by mouth 2 (two) times daily with a meal.      . NAPROXEN SODIUM 220 MG PO TABS Oral Take 440 mg by mouth daily as needed. For pain     . MIRALAX PO Oral Take by mouth.    [name] GAS-X PO Oral Take 1 tablet by mouth as needed.      BP 146/78  Pulse 44  Temp(Src) 98.9 F (  37.2 C) (Oral)  Resp 20  SpO2 99%  Physical Exam  Nursing note and vitals reviewed. Constitutional: He is oriented to person, place, and time. He appears well-developed and well-nourished. No distress.       Nontoxic but uncomfortable appearing  HENT:  Head: Normocephalic and atraumatic.  Eyes: Conjunctivae and EOM are normal. Pupils are equal, round, and reactive to light.  Neck: Normal range of motion. Neck supple.  Cardiovascular: Normal rate, regular rhythm, normal heart sounds and intact distal pulses.  Exam reveals no gallop and no friction rub.   No murmur heard.      Radial and femoral pulses equal bilaterally  Pulmonary/Chest: Effort normal and breath sounds normal. No respiratory distress. He has no wheezes. He has no rales. He exhibits tenderness.       Severe  tenderness to palpation of mid lower chest wall with guarding.  Abdominal: Bowel sounds are normal. He exhibits no distension and no mass. There is tenderness. There is guarding. There is no rebound.       Severe tenderness to palpation of epigastric region with guarding but no rigidity. No pulsatile mass.  Musculoskeletal: Normal range of motion. He exhibits no edema and no tenderness.  Neurological: He is alert and oriented to person, place, and time.  Skin: Skin is warm. No rash noted. He is diaphoretic. No erythema.  Psychiatric: He has a normal mood and affect.    ED Course  Procedures (including critical care time)  Patient actively vomiting upon initial evaluation. IV Zofran and pain medication given. Patient able to lie more comfortably in bed however heart rate became bradycardic between 40s and 50s the patient became diaphoretic. Patient as remained alert and oriented throughout the event. Blood pressures remained stable. Portable chest x-ray ordered with out any acute findings or free air. Patient continuing to complain of severe upper abdominal/lower chest pain.   Date: 07/26/2011  Rate: 54  Rhythm: sinus tachycardia and sinus arrhythmia  QRS Axis: normal  Intervals: normal  ST/T Wave abnormalities: normal  Conduction Disutrbances:none  Narrative Interpretation:   Old EKG Reviewed: non provocative EKG compared to Jun 17, 2011    Labs Reviewed  CBC - Abnormal; Notable for the following:    Platelets 144 (*)    All other components within normal limits  COMPREHENSIVE METABOLIC PANEL - Abnormal; Notable for the following:    Glucose, Bld 151 (*)    Calcium 10.7 (*)    ALT 75 (*)    All other components within normal limits  GLUCOSE, CAPILLARY - Abnormal; Notable for the following:    Glucose-Capillary 141 (*)    All other components within normal limits  DIFFERENTIAL  LIPASE, BLOOD  POCT I-STAT TROPONIN I  I-STAT TROPONIN I  POCT CBG MONITORING  POCT CBG MONITORING   LACTIC ACID, PLASMA   Dg Chest Port 1 View  07/26/2011  *RADIOLOGY REPORT*  Clinical Data: Substernal chest pain  PORTABLE CHEST - 1 VIEW  Comparison: 07/03/2011  Findings: Cardiac leads overlie the chest.  Heart size is normal. Lung volumes are low but clear.  No pleural effusion.  No acute osseous finding.  IMPRESSION: Normal chest.  Original Report Authenticated By: Harrel Lemon, M.D.     No diagnosis found.    MDM  Sign out given to Fayrene Helper and Dr. Preston Fleeting. BP remained stable but patient remained brady though stays alert and oriented. CT scan pending at this point no free air seen on chest x-ray and no  significant findings on labs. However patient continues to complain of pain. Disposition pending CT scan results and reevaluation of patient.        Jenness [place], [place] 07/26/11 [phone]

## 2011-07-27 ENCOUNTER — Other Ambulatory Visit: Payer: Self-pay

## 2011-07-27 ENCOUNTER — Encounter (HOSPITAL_COMMUNITY): Payer: Self-pay | Admitting: Physician Assistant

## 2011-07-27 DIAGNOSIS — K219 Gastro-esophageal reflux disease without esophagitis: Secondary | ICD-10-CM

## 2011-07-27 DIAGNOSIS — R1012 Left upper quadrant pain: Secondary | ICD-10-CM | POA: Diagnosis present

## 2011-07-27 DIAGNOSIS — K8020 Calculus of gallbladder without cholecystitis without obstruction: Secondary | ICD-10-CM | POA: Diagnosis present

## 2011-07-27 DIAGNOSIS — R079 Chest pain, unspecified: Secondary | ICD-10-CM | POA: Diagnosis present

## 2011-07-27 DIAGNOSIS — R7989 Other specified abnormal findings of blood chemistry: Secondary | ICD-10-CM

## 2011-07-27 LAB — URINALYSIS, ROUTINE W REFLEX MICROSCOPIC
Bilirubin Urine: NEGATIVE
Glucose, UA: NEGATIVE mg/dL
Hgb urine dipstick: NEGATIVE
Ketones, ur: NEGATIVE mg/dL
Nitrite: NEGATIVE
Protein, ur: NEGATIVE mg/dL
Specific Gravity, Urine: 1.028 (ref 1.005–1.030)
Urobilinogen, UA: 0.2 mg/dL (ref 0.0–1.0)
pH: 5.5 (ref 5.0–8.0)

## 2011-07-27 LAB — CARDIAC PANEL(CRET KIN+CKTOT+MB+TROPI)
CK, MB: 1.9 ng/mL (ref 0.3–4.0)
CK, MB: 1.7 ng/mL (ref 0.3–4.0)
Relative Index: 1.7 (ref 0.0–2.5)
Relative Index: 1.6 (ref 0.0–2.5)
Total CK: 112 U/L (ref 7–232)
Total CK: 104 U/L (ref 7–232)
Troponin I: <0.3 ng/mL (ref <0.30)
Troponin I: <0.3 ng/mL (ref <0.30)

## 2011-07-27 LAB — URINE MICROSCOPIC-ADD ON

## 2011-07-27 LAB — CBC
HCT: 41.4 % (ref 39.0–52.0)
Hemoglobin: 14.3 g/dL (ref 13.0–17.0)
MCH: 27.7 pg (ref 26.0–34.0)
MCHC: 34.5 g/dL (ref 30.0–36.0)
MCV: 80.2 fL (ref 78.0–100.0)
Platelets: 126 10*3/uL — ABNORMAL LOW (ref 150–400)
RBC: 5.16 MIL/uL (ref 4.22–5.81)
RDW: 14.3 % (ref 11.5–15.5)
WBC: 8.4 10*3/uL (ref 4.0–10.5)

## 2011-07-27 LAB — COMPREHENSIVE METABOLIC PANEL
ALT: 59 U/L — ABNORMAL HIGH (ref 0–53)
AST: 21 U/L (ref 0–37)
Albumin: 3.7 g/dL (ref 3.5–5.2)
Alkaline Phosphatase: 67 U/L (ref 39–117)
BUN: 11 mg/dL (ref 6–23)
CO2: 28 mEq/L (ref 19–32)
Calcium: 9.4 mg/dL (ref 8.4–10.5)
Chloride: 103 mEq/L (ref 96–112)
Creatinine, Ser: 0.79 mg/dL (ref 0.50–1.35)
GFR calc Af Amer: >90 mL/min (ref >90)
GFR calc non Af Amer: >90 mL/min (ref >90)
Glucose, Bld: 91 mg/dL (ref 70–99)
Potassium: 3.3 mEq/L — ABNORMAL LOW (ref 3.5–5.1)
Sodium: 139 mEq/L (ref 135–145)
Total Bilirubin: 0.6 mg/dL (ref 0.3–1.2)
Total Protein: 7 g/dL (ref 6.0–8.3)

## 2011-07-27 LAB — GLUCOSE, CAPILLARY
Glucose-Capillary: 103 mg/dL — ABNORMAL HIGH (ref 70–99)
Glucose-Capillary: 103 mg/dL — ABNORMAL HIGH (ref 70–99)
Glucose-Capillary: 81 mg/dL (ref 70–99)
Glucose-Capillary: 86 mg/dL (ref 70–99)
Glucose-Capillary: 90 mg/dL (ref 70–99)
Glucose-Capillary: 97 mg/dL (ref 70–99)

## 2011-07-27 LAB — MAGNESIUM: Magnesium: 1.8 mg/dL (ref 1.5–2.5)

## 2011-07-27 LAB — PHOSPHORUS: Phosphorus: 4 mg/dL (ref 2.3–4.6)

## 2011-07-27 LAB — HEMOGLOBIN A1C
Hgb A1c MFr Bld: 5.5 % (ref <5.7)
Mean Plasma Glucose: 111 mg/dL (ref <117)

## 2011-07-27 MED ORDER — POTASSIUM CHLORIDE IN NACL 20-0.45 MEQ/L-% IV SOLN
INTRAVENOUS | Status: DC
  Administered 2011-07-27 – 2011-07-28 (×4): via INTRAVENOUS
  Filled 2011-07-27 (×6): qty 1000

## 2011-07-27 MED ORDER — STARCH (THICKENING) PO POWD
ORAL | Status: DC | PRN
  Filled 2011-07-27: qty 227

## 2011-07-27 MED ORDER — PANTOPRAZOLE SODIUM 40 MG PO TBEC
40.0000 mg | DELAYED_RELEASE_TABLET | Freq: Two times a day (BID) | ORAL | Status: DC
  Administered 2011-07-27: 40 mg via ORAL
  Filled 2011-07-27: qty 1

## 2011-07-27 NOTE — Progress Notes (Signed)
Subjective: He c/o persistent LUQ abd pain and some midsternal chest discomfort. Objective: Vital signs in last 24 hours: Temp:  [97.3 F (36.3 C)-98.9 F (37.2 C)] 98.7 F (37.1 C) (01/21 0600) Pulse Rate:  [40-85] 85  (01/21 0600) Resp:  [14-22] 20  (01/21 0600) BP: (126-161)/(64-114) 130/77 mmHg (01/21 0600) SpO2:  [95 %-100 %] 99 % (01/21 0600) Weight:  [259 lb 4.2 oz (117.6 kg)] 259 lb 4.2 oz (117.6 kg) (01/21 0500)    Intake/Output from previous day:   Intake/Output this shift:    PE: Abd-soft, Luq tenderness and guarding, no RUQ tenderness  Lab Results:   Basename 07/27/11 0229 07/26/11 1331  WBC 8.4 7.6  HGB 14.3 15.9  HCT 41.4 46.8  PLT 126* 144*   BMET  Basename 07/27/11 0229 07/26/11 1331  NA 139 139  K 3.3* 3.6  CL 103 102  CO2 28 25  GLUCOSE 91 151*  BUN 11 15  CREATININE 0.79 0.93  CALCIUM 9.4 10.7*   PT/INR No results found for this basename: LABPROT:2,INR:2 in the last 72 hours Comprehensive Metabolic Panel:    Component Value Date/Time   NA 139 07/27/2011 0229   K 3.3* 07/27/2011 0229   CL 103 07/27/2011 0229   CO2 28 07/27/2011 0229   BUN 11 07/27/2011 0229   CREATININE 0.79 07/27/2011 0229   GLUCOSE 91 07/27/2011 0229   CALCIUM 9.4 07/27/2011 0229   AST 21 07/27/2011 0229   ALT 59* 07/27/2011 0229   ALKPHOS 67 07/27/2011 0229   BILITOT 0.6 07/27/2011 0229   PROT 7.0 07/27/2011 0229   ALBUMIN 3.7 07/27/2011 0229     Studies/Results: US Abdomen Complete  07/26/2011  *RADIOLOGY REPORT*  Clinical Data:  Left upper quadrant pain.  COMPLETE ABDOMINAL ULTRASOUND  Comparison:  CT abdomen and pelvis 07/26/2011 at 1652 hours and abdominal ultrasound 07/03/2011.  Findings:  Gallbladder:  As on the prior studies, a stone is identified in the neck of the gallbladder measuring 1.8 cm.  No pericholecystic fluid or wall thickening.  Sonographer reports negative Murphy's sign.  Common bile duct:  Measures 0.4 cm.  Liver:  The liver appears heterogeneous with  some increased echogenicity.  No focal lesion.  IVC:  Appears normal.  Pancreas:  No focal abnormality seen.  Spleen:  No focal lesion.  Measures 12.0 x 15.6 x 7.2 cm for a volume of 703 ccs compared to 910 ccs on the prior exam.  Right Kidney:  Measures 12.0 cm and appears normal.  Left Kidney:  Measures 15.2 cm and appears normal.  Abdominal aorta:  No aneurysm identified.  IMPRESSION:  1.  Single gallstone in the gallbladder neck without evidence of cholecystitis. 2.  Persistent but decreased splenomegaly.  Original Report Authenticated By: Bernadene Bell. Maricela Curet, M.D.   Ct Abdomen Pelvis W Contrast  07/26/2011  *RADIOLOGY REPORT*  Clinical Data: Abdominal pain.  CT ABDOMEN AND PELVIS WITH CONTRAST  Technique:  Multidetector CT imaging of the abdomen and pelvis was performed following the standard protocol during bolus administration of intravenous contrast.  Contrast: OMNIPAQUE IOHEXOL 300 MG/ML IV SOLN  Comparison: Abdominal ultrasound 07/03/2011.  Findings: Mild atelectasis is seen in the left lung base.  Imaged lung parenchyma is otherwise clear.  No pleural or pericardial effusion.  A single stone is seen in the neck of the gallbladder.  No pericholecystic inflammatory change is identified.  The spleen is unremarkable.  The liver is low attenuating consistent with fatty change.  No focal liver lesion.  The biliary tree, adrenal glands, pancreas and kidneys appear normal.  Seminal vesicles, prostate gland urinary bladder are unremarkable.  The stomach, small and large bowel and appendix all appear normal.  The patient has a unilateral pars interarticularis defects on the right at L5.  No anterolisthesis.  No other focal bony abnormality.  IMPRESSION:  1.  No acute finding. 2.  Fatty infiltration of the liver. 3.  Unilateral pars interarticularis defect on the right at L5.  No anterolisthesis. 4.  Single gallstone is identified.  No evidence of cholecystitis.  Original Report Authenticated By: Bernadene Bell.  Maricela Curet, M.D.   Dg Chest Port 1 View  07/26/2011  *RADIOLOGY REPORT*  Clinical Data: Substernal chest pain  PORTABLE CHEST - 1 VIEW  Comparison: 07/03/2011  Findings: Cardiac leads overlie the chest.  Heart size is normal. Lung volumes are low but clear.  No pleural effusion.  No acute osseous finding.  IMPRESSION: Normal chest.  Original Report Authenticated By: Harrel Lemon, M.D.    Anti-infectives: Anti-infectives    None      Assessment Active Problems:  Abdominal pain, LUQ-cause unclear currently  Splenomegaly  Cholelithiasis    LOS: 1 day   Plan: Await GI evaluation.  No cholecystectomy planned currently.   Mubashir Mallek J 07/27/2011

## 2011-07-27 NOTE — H&P (Signed)
I have seen and examined this patient. I have discussed with Dr Lula Olszewski.  I agree with their findings and plans as documented in their admission note.  Acute Issues 1. LUQ abdominal pain, acute. - associated vomiting and SSCP - No hematemesis, no melena, no hematochezia. - Long hx of mixed diarrhea/constipation. -  EGD in 2003 with some non-ulcerative abnormality per pt. - taking Ibuprofen two tabs twice a week for headache and muscle spasm. - Recent dx of infectious mononucleosis with splenomegaly.  No evidence of splenic rupture on CT or U/S.  Splenic volume less than at end of December on U/S. - Normal lipase. - EKG without acute ST changes. Cardiac Enzymes (initial) normal.   - Ddx includes PUD, esophagitis, pyelonephritis, irritable bowel disorder flare, ACS  Plan: Analgesia Consultation with GI service for consideration of endoscopic evaluation High-dose PPI Check urinalysis NPO except meds with sips and ice chips.  Complete Rule out ACS: EKG and troponin I.

## 2011-07-27 NOTE — Progress Notes (Signed)
At 0208 pt was seen jumping  and trashing around in bed. When ask what was wrong, pt gave along history of his date events starting from home to the ER and now ( the  present). Pt C/O having abdominal and chest each a 5/10 pain which can only be relieved by his pain medication. Pt stated that the pain medication will make him sleep and he will be  "ok" until he wakes up again. Dilaudid 1 mg  IV administered as ordered and pt fell asleep immediately. We will continue to monitor.

## 2011-07-27 NOTE — Progress Notes (Signed)
07-27-11 UR completed. Alyzah Pelly RN BSN  

## 2011-07-27 NOTE — Progress Notes (Signed)
PGY-1 Daily Progress Note Family Medicine Teaching Service Asma Boldon M. Diyana Starrett, MD Service Pager: [phone]  Subjective: Patient continues to complain of pain in LUQ of abdomen and chest pain. Ranks his pain 4/10 on Dilaudid. Worse with movement. States GI saw him this morning and wants to do a scope. He states he does not have an appetite, denies N/V.  Objective: Vital signs in last 24 hours: Temp:  [97.3 F (36.3 C)-98.9 F (37.2 C)] 98.7 F (37.1 C) (01/21 0600) Pulse Rate:  [40-85] 85  (01/21 0600) Resp:  [14-22] 20  (01/21 0600) BP: (126-161)/(64-114) 130/77 mmHg (01/21 0600) SpO2:  [95 %-100 %] 99 % (01/21 0600) Weight:  [259 lb 4.2 oz (117.6 kg)] 259 lb 4.2 oz (117.6 kg) (01/21 0500) Weight change:     Intake/Output from previous day:   Intake/Output this shift: Total I/O In: 586.7 [I.V.:586.7] Out: 25 [Urine:25]  General appearance: alert, cooperative and mild distress Head: Normocephalic, without obvious abnormality, atraumatic Resp: clear to auscultation bilaterally Chest wall: Midline tenderness Cardio: regular rate and rhythm GI: Exquisite tenderness to gentle touch, LUQ and epigastric area. Grimaces with palpation of entire abdomen. No rebound tenderness. Extremities: extremities normal, atraumatic, no cyanosis or edema Neurologic: Grossly normal  Lab Results:  Basename 07/27/11 0229 07/26/11 1331  WBC 8.4 7.6  HGB 14.3 15.9  HCT 41.4 46.8  PLT 126* 144*   BMET  Basename 07/27/11 0229 07/26/11 1331  NA 139 139  K 3.3* 3.6  CL 103 102  CO2 28 25  GLUCOSE 91 151*  BUN 11 15  CREATININE 0.79 0.93  CALCIUM 9.4 10.7*    Studies/Results: US Abdomen Complete  07/26/2011  *RADIOLOGY REPORT*  Clinical Data:  Left upper quadrant pain.  COMPLETE ABDOMINAL ULTRASOUND  Comparison:  CT abdomen and pelvis 07/26/2011 at 1652 hours and abdominal ultrasound 07/03/2011.  Findings:  Gallbladder:  As on the prior studies, a stone is identified in the neck of the  gallbladder measuring 1.8 cm.  No pericholecystic fluid or wall thickening.  Sonographer reports negative Murphy's sign.  Common bile duct:  Measures 0.4 cm.  Liver:  The liver appears heterogeneous with some increased echogenicity.  No focal lesion.  IVC:  Appears normal.  Pancreas:  No focal abnormality seen.  Spleen:  No focal lesion.  Measures 12.0 x 15.6 x 7.2 cm for a volume of 703 ccs compared to 910 ccs on the prior exam.  Right Kidney:  Measures 12.0 cm and appears normal.  Left Kidney:  Measures 15.2 cm and appears normal.  Abdominal aorta:  No aneurysm identified.  IMPRESSION:  1.  Single gallstone in the gallbladder neck without evidence of cholecystitis. 2.  Persistent but decreased splenomegaly.  Original Report Authenticated By: Bernadene Bell. Maricela Curet, M.D.   Ct Abdomen Pelvis W Contrast  07/26/2011  *RADIOLOGY REPORT*  Clinical Data: Abdominal pain.  CT ABDOMEN AND PELVIS WITH CONTRAST  Technique:  Multidetector CT imaging of the abdomen and pelvis was performed following the standard protocol during bolus administration of intravenous contrast.  Contrast: OMNIPAQUE IOHEXOL 300 MG/ML IV SOLN  Comparison: Abdominal ultrasound 07/03/2011.  Findings: Mild atelectasis is seen in the left lung base.  Imaged lung parenchyma is otherwise clear.  No pleural or pericardial effusion.  A single stone is seen in the neck of the gallbladder.  No pericholecystic inflammatory change is identified.  The spleen is unremarkable.  The liver is low attenuating consistent with fatty change.  No focal liver lesion.  The biliary  tree, adrenal glands, pancreas and kidneys appear normal.  Seminal vesicles, prostate gland urinary bladder are unremarkable.  The stomach, small and large bowel and appendix all appear normal.  The patient has a unilateral pars interarticularis defects on the right at L5.  No anterolisthesis.  No other focal bony abnormality.  IMPRESSION:  1.  No acute finding. 2.  Fatty infiltration of the  liver. 3.  Unilateral pars interarticularis defect on the right at L5.  No anterolisthesis. 4.  Single gallstone is identified.  No evidence of cholecystitis.  Original Report Authenticated By: Bernadene Bell. Maricela Curet, M.D.   Dg Chest Port 1 View  07/26/2011  *RADIOLOGY REPORT*  Clinical Data: Substernal chest pain  PORTABLE CHEST - 1 VIEW  Comparison: 07/03/2011  Findings: Cardiac leads overlie the chest.  Heart size is normal. Lung volumes are low but clear.  No pleural effusion.  No acute osseous finding.  IMPRESSION: Normal chest.  Original Report Authenticated By: Harrel Lemon, M.D.    Medications:  I have reviewed the patient's current medications. Scheduled:   . aspirin EC  81 mg Oral Daily  . fentaNYL  100 mcg Intravenous Once  . gi cocktail  30 mL Oral Once  . heparin subcutaneous  5,000 Units Subcutaneous Q8H  . hydrochlorothiazide  12.5 mg Oral Daily  .  HYDROmorphone (DILAUDID) injection  1 mg Intravenous Once  .  HYDROmorphone (DILAUDID) injection  1 mg Intravenous Once  .  HYDROmorphone (DILAUDID) injection  1 mg Intravenous Once  .  HYDROmorphone (DILAUDID) injection  2 mg Intravenous Once  . insulin aspart  0-5 Units Subcutaneous QHS  . insulin aspart  0-9 Units Subcutaneous TID WC  . lisinopril  10 mg Oral Daily  . ondansetron  8 mg Intravenous Once  . pantoprazole  40 mg Oral BID AC  . sodium chloride  1,000 mL Intravenous Once  . DISCONTD: lisinopril-hydrochlorothiazide  1 tablet Oral Daily   Continuous:   . 0.45 % NaCl with KCl 20 mEq / L 125 mL/hr at 07/27/11 0920  . DISCONTD: sodium chloride 100 mL/hr at 07/27/11 0700   ZOX:[id number] (DILAUDID) injection, iohexol, ondansetron (ZOFRAN) IV, ondansetron, polyethylene glycol, simethicone  Assessment/Plan: 41 yo M presenting with acute onset abdominal pain   1. Abdominal pain: Worse in LUQ. Patient with recently history of mono with splenomegaly on abd [place] on 07/03/11. Abd CT today did not reveal  splenomegaly. - Abd U/S showed decreased splenomegaly as compared to recent exam - Continue Dilaudid 1mg  prn for pain, this seems to be keeping his pain under control somewhat - WBC, lipase, lactic acid and procalcitonin all within normal limits.  - Surgery has evaluated and no need for surgery at this time.  - GI plans to proceed with EGD to evaluate for PUD given his recent NSAID use. - Will keep patient NPO for now - Continue to monitor for any changes   2. Chest pain: Patient began having chest pain after vomiting. Does not appear to be typical for cardiac. Received GI cocktail in the ED as well as Dilaudid with some relief.  - Crdiac enzymes negative x3 - Pain reproducible on palpation - Monitor on telemetry   3. DM: On Metformin at home. CBG stable since admission - Holding Metformin. Patient received contrast CT scan, therefore this will not be restarted.  - Sensitive SSI and CBG checks qac/qhs   4. HTN: On Lisinopril HCTZ at home. States his BP has been stable at home.  - Continue  home medication.  - Continue to monitor BP and HR   5. Hypercalcemia: Calcium on admission elevated to 10.7. Patient states he took 4 Tums today. Calcium at last admission was 8. Calcium today 9.4 after fluids - Still awaiting UA to check for protein.   6. MS: Not currently on any therapy. Followed by Neurology. Current chief complaint not evidently related to MS, therefore this will not be addressed during admission.  - PT/OT consults prior to discharge given his weakness and gait abnormality. Will wait for his pain to be better controlled to allow for better evaluation by PT/OT  7. FEN/GI: NPO. On Protonix. Will advance diet after he is seen by GI, and once he can tolerate.  8. PPx: Heparin SQ  9. Dispo: Pending overall clinical improvement.   LOS: 1 day   Soloman Mckeithan 07/27/2011, 9:49 AM

## 2011-07-27 NOTE — Progress Notes (Signed)
FMTS Attending Note  Patient seen and examined by me; he reports his abdominal pain is much better with prn pain medications when laying still in bed.  The chest pain is 4 out of 10 at this time.  Has exquisite tenderness with light palpation that is worse along LUQ.  Audible bowel sounds.  Plan for GI evaluation for what appears to be likely PUD based on history and presentation.  He reports having had 'irritation' on an EGD in 2003 (cannot recall name of GI practice-- did not ever see again) but no ulcer history.  Quit smoking at age 81, drinks a glass of wine with dinner monthly.  Long history of bowel irregularities including reported lactose intolerance.  Paula Compton, M.D.

## 2011-07-27 NOTE — Consult Note (Signed)
[facility] Consult: 12:18 PM 07/27/2011   Referring Provider: Rodman Pickle, MD  Resident. Primary Care Physician:  Georgian Co, PA, PA Primary Gastroenterologist:  None, unassigned  Reason for Consultation:  Abdominal pain.   HPI: Brandon [name] is a 41 y.o. male.  Pt is a obese, diabetic man with multiple sclerosis.  He was hopitalized with mononucleosis in late December 2012.  Had elevated LFTs at the time.    Now readmitted with acute pain just above and slightly to left of umbilicus. Was not nauseated but tried to make himself vomit to see if he would get relief.   Inadequate pain control with Dilaudid.  Overalll improved LFTs compared to 07/05/11.  Ultrasound and CT scans show fatty liver, improving splenomegaly, solitary uncomplicted gallstone.  Dr Abbey Chatters not impressed that sxs are from the gall stone.  Pt had EGD many years ago. Had "irritation".  Takes naproxen infrequently. None recently.  Uses 81 mg ASA daily, no PPI.  GI wise has episodes of fecal urgency, loose stools.  Food feels like it sits in his stomach and delayed in emptying on occasion.   No dark stools.    Past Medical History  Diagnosis Date  . Hypertension   . MS (multiple sclerosis)   . Sleep apnea   . Pneumonia 2010  . Hyperinsulinemia 07/04/11    "I'm on metformin cause I produce too much insulin"  . Headache   . TIA (transient ischemic attack) 2000    denies residual  . Stroke   . Pre-diabetes   . Depression 2005  . Anxiety 2005  . Diabetes mellitus     Past Surgical History  Procedure Date  . Tonsillectomy and adenoidectomy ~ 1980  . Myringotomy 1979; 1980    bilaterally    Prior to Admission medications   Medication Sig Start Date End Date Taking? Authorizing Provider  aspirin EC 81 MG tablet Take 81 mg by mouth daily.     Yes Historical Provider, MD  calcium carbonate (TUMS - DOSED IN MG ELEMENTAL CALCIUM) 500 MG chewable tablet Chew 1 tablet by  mouth daily as needed. For upset stomach   Yes Historical Provider, MD  fish oil-omega-3 fatty acids 1000 MG capsule Take 2 g by mouth daily.     Yes Historical Provider, MD  lisinopril-hydrochlorothiazide (PRINZIDE,ZESTORETIC) 10-12.5 MG per tablet Take 1 tablet by mouth daily.     Yes Historical Provider, MD  metFORMIN (GLUCOPHAGE) 500 MG tablet Take 1,000 mg by mouth 2 (two) times daily with a meal.     Yes Historical Provider, MD  naproxen sodium (ANAPROX) 220 MG tablet Take 440 mg by mouth daily as needed. For pain   Yes Historical Provider, MD  Simethicone (GAS-X PO) Take 1 tablet by mouth daily as needed. For gas relieve 07/22/11  Yes Historical Provider, MD    Scheduled Meds:    . aspirin EC  81 mg Oral Daily  . fentaNYL  100 mcg Intravenous Once  . gi cocktail  30 mL Oral Once  . heparin subcutaneous  5,000 Units Subcutaneous Q8H  . hydrochlorothiazide  12.5 mg Oral Daily  .  HYDROmorphone (DILAUDID) injection  1 mg Intravenous Once  .  HYDROmorphone (DILAUDID) injection  1 mg Intravenous Once  .  HYDROmorphone (DILAUDID) injection  1 mg Intravenous Once  .  HYDROmorphone (DILAUDID) injection  2 mg Intravenous Once  . insulin aspart  0-5 Units Subcutaneous QHS  . insulin aspart  0-9 Units Subcutaneous TID WC  . lisinopril  10 mg Oral Daily  . ondansetron  8 mg Intravenous Once  . pantoprazole  40 mg Oral BID AC  . sodium chloride  1,000 mL Intravenous Once  . DISCONTD: lisinopril-hydrochlorothiazide  1 tablet Oral Daily   Infusions:    . 0.45 % NaCl with KCl 20 mEq / L 125 mL/hr at 07/27/11 0920  . DISCONTD: sodium chloride 100 mL/hr at 07/27/11 0700   PRN Meds: HYDROmorphone (DILAUDID) injection, iohexol, ondansetron (ZOFRAN) IV, ondansetron, polyethylene glycol, simethicone   Allergies as of 07/26/2011 - Review Complete 07/26/2011  Allergen Reaction Noted  . Sulfa antibiotics Other (See Comments) 07/03/2011  . Cephalosporins Other (See Comments) 07/03/2011  .  Penicillins Other (See Comments) 07/03/2011  . Quinolones Other (See Comments)     Family History  Problem Relation Age of Onset  . Coronary artery disease Father   . Stroke Father   . Diabetes Mother     History   Social History  . Marital Status: Single    Spouse Name: N/A    Number of Children: N/A  . Years of Education: N/A   Occupational History  . Not on file.   Social History Main Topics  . Smoking status: Former Smoker -- 3.0 packs/day for 3 years    Types: Cigarettes    Quit date: 07/07/1987  . Smokeless tobacco: Never Used  . Alcohol Use: No     07/04/11 "glass of wine or spirits once a month"  . Drug Use: Yes    Special: Marijuana     Rare use of marijuana; "once a year tops"  . Sexually Active: Yes   Other Topics Concern  . Not on file   Social History Narrative  . No narrative on file    REVIEW OF SYSTEMS: Constitutional:  No weight loss ENT:  No nasal bleedinf or discharge Pulm:  No cough or SOB CV:  Chest pain yesterday.  Slow heart rate in 50s at home yesterday GU:  No tea color urine GI:  As above Heme:  No unusual bleeding or bruising.    Transfusions:  none Neuro:  No headache.  MS causes tingling pain in legs at times Derm:  No rash or pruritus Endocrine:  No polyuria or polydipsia. Immunization:  Flu shot 04/1012 Travel:  None beyond local counties.   PHYSICAL EXAM: Vital signs in last 24 hours: Temp:  [97.3 F (36.3 C)-98.9 F (37.2 C)] 98.7 F (37.1 C) (01/21 0600) Pulse Rate:  [40-85] 85  (01/21 0600) Resp:  [14-22] 20  (01/21 0600) BP: (126-161)/(64-114) 130/77 mmHg (01/21 0600) SpO2:  [95 %-100 %] 99 % (01/21 0600) Weight:  [117.6 kg (259 lb 4.2 oz)] 117.6 kg (259 lb 4.2 oz) (01/21 0500)  Last BM Date: 07/26/11  General: obese [place] islander man Head:  No trauma signs  Eyes:  No icterus Ears:  Not HOH  Nose:  No discharge. Mouth:  Pink, clear mm Neck:  Obese, no masses or bruits Lungs:  Clear B.  No cough or  dyspnea. Heart: RRR.  No MRG Abdomen:  Soft, obese, tender in mid and upper abdomen, worse on left.  No Guard or rebound.   Rectal: not done   Musc/Skeltl: no swelling or joint deformities Extremities:  Pedal edema is non pitting Neurologic:  Anxious, rapid/pressured speech pattern.  Not confused.  cooperative Skin:  No jaundice or rash Tattoos:  None seen Nodes:  None at neck   Psych:  Pleasant, anxious.   Intake/Output this shift: Total  I/O In: 586.7 [I.V.:586.7] Out: 425 [Urine:425]  LAB RESULTS:  Basename 07/27/11 0229 07/26/11 1331  WBC 8.4 7.6  HGB 14.3 15.9  HCT 41.4 46.8  PLT 126* 144*   BMET Lab Results  Component Value Date   NA 139 07/27/2011   NA 139 07/26/2011   NA 138 07/05/2011   K 3.3* 07/27/2011   K 3.6 07/26/2011   K 3.7 07/05/2011   CL 103 07/27/2011   CL 102 07/26/2011   CL 104 07/05/2011   CO2 28 07/27/2011   CO2 25 07/26/2011   CO2 28 07/05/2011   GLUCOSE 91 07/27/2011   GLUCOSE 151* 07/26/2011   GLUCOSE 106* 07/05/2011   BUN 11 07/27/2011   BUN 15 07/26/2011   BUN 10 07/05/2011   CREATININE 0.79 07/27/2011   CREATININE 0.93 07/26/2011   CREATININE 0.92 07/05/2011   CALCIUM 9.4 07/27/2011   CALCIUM 10.7* 07/26/2011   CALCIUM 8.5 07/05/2011   LFT Lab Results  Component Value Date   PROT 7.0 07/27/2011   PROT 8.3 07/26/2011   PROT 6.3 07/05/2011   ALBUMIN 3.7 07/27/2011   ALBUMIN 4.5 07/26/2011   ALBUMIN 3.1* 07/05/2011   AST 21 07/27/2011   AST 30 07/26/2011   AST 91* 07/05/2011   ALT 59* 07/27/2011   ALT 75* 07/26/2011   ALT 285* 07/05/2011   ALKPHOS 67 07/27/2011   ALKPHOS 82 07/26/2011   ALKPHOS 135* 07/05/2011   BILITOT 0.6 07/27/2011   BILITOT 0.4 07/26/2011   BILITOT 0.7 07/05/2011   PT/INR Lab Results  Component Value Date   INR 1.06 06/11/2009   Hepatitis Panel No results found for this basename: HEPBSAG,HCVAB,HEPAIGM,HEPBIGM in the last 72 hours C-Diff No components found with this basename: cdiff    Drugs of Abuse  No results  found for this basename: labopia, cocainscrnur, labbenz, amphetmu, thcu, labbarb     RADIOLOGY STUDIES: US Abdomen Complete  07/26/2011  *RADIOLOGY REPORT*  Clinical Data:  Left upper quadrant pain.  COMPLETE ABDOMINAL ULTRASOUND  Comparison:  CT abdomen and pelvis 07/26/2011 at 1652 hours and abdominal ultrasound 07/03/2011.  Findings:  Gallbladder:  As on the prior studies, a stone is identified in the neck of the gallbladder measuring 1.8 cm.  No pericholecystic fluid or wall thickening.  Sonographer reports negative Murphy's sign.  Common bile duct:  Measures 0.4 cm.  Liver:  The liver appears heterogeneous with some increased echogenicity.  No focal lesion.  IVC:  Appears normal.  Pancreas:  No focal abnormality seen.  Spleen:  No focal lesion.  Measures 12.0 x 15.6 x 7.2 cm for a volume of 703 ccs compared to 910 ccs on the prior exam.  Right Kidney:  Measures 12.0 cm and appears normal.  Left Kidney:  Measures 15.2 cm and appears normal.  Abdominal aorta:  No aneurysm identified.  IMPRESSION:  1.  Single gallstone in the gallbladder neck without evidence of cholecystitis. 2.  Persistent but decreased splenomegaly.  Original Report Authenticated By: Bernadene Bell. Maricela Curet, M.D.   Ct Abdomen Pelvis W Contrast  07/26/2011  *RADIOLOGY REPORT*  Clinical Data: Abdominal pain.  CT ABDOMEN AND PELVIS WITH CONTRAST  Technique:  Multidetector CT imaging of the abdomen and pelvis was performed following the standard protocol during bolus administration of intravenous contrast.  Contrast: OMNIPAQUE IOHEXOL 300 MG/ML IV SOLN  Comparison: Abdominal ultrasound 07/03/2011.  Findings: Mild atelectasis is seen in the left lung base.  Imaged lung parenchyma is otherwise clear.  No pleural or pericardial effusion.  A single stone is seen in the neck of the gallbladder.  No pericholecystic inflammatory change is identified.  The spleen is unremarkable.  The liver is low attenuating consistent with fatty change.  No  focal liver lesion.  The biliary tree, adrenal glands, pancreas and kidneys appear normal.  Seminal vesicles, prostate gland urinary bladder are unremarkable.  The stomach, small and large bowel and appendix all appear normal.  The patient has a unilateral pars interarticularis defects on the right at L5.  No anterolisthesis.  No other focal bony abnormality.  IMPRESSION:  1.  No acute finding. 2.  Fatty infiltration of the liver. 3.  Unilateral pars interarticularis defect on the right at L5.  No anterolisthesis. 4.  Single gallstone is identified.  No evidence of cholecystitis.  Original Report Authenticated By: Bernadene Bell. Maricela Curet, M.D.   Dg Chest Port 1 View  07/26/2011  *RADIOLOGY REPORT*  Clinical Data: Substernal chest pain  PORTABLE CHEST - 1 VIEW  Comparison: 07/03/2011  Findings: Cardiac leads overlie the chest.  Heart size is normal. Lung volumes are low but clear.  No pleural effusion.  No acute osseous finding.  IMPRESSION: Normal chest.  Original Report Authenticated By: Harrel Lemon, M.D.    ENDOSCOPIC STUDIES: 20023 EGD  Pt does not recall name of the MD.  Done in [place].  Showed "irritation" . IMPRESSION: 1. Acute pain in mid/left upper quadrant.  Single gallstone and fatty liver, resolving splenomegaly on CT and u/s of 1/20.  R/O peptic ulcer vs symptomatic gallstone.   2.  Mononucleosis:  Splenomegaly and abnormal LFTs admission 12/23 - 07/03/2011. 3.  Multiple sclerosis. 4.  NIDDM 5.  Morbid obesity.    6.  Obstructive sleep apnea.   PLAN: 1.  egd tomorrow.  Orders entered 2.  Stop sq heparin before the EGD.  Continue empiric Protonix po.  Allow clear liquids.    LOS: 1 day   Jennye Moccasin  07/27/2011, 12:18 PM Pager: [phone]  GI ATTENDING  HX , LABS, RADIOLOGY REVIEWED. PATIENT SEEN AND EXAMINED. ADMITTED WITH SEVERE CHEST PAIN AND LUQ ABDOMINAL PAIN. BOTH ATYPICAL. STILL WITH PAIN TODAY, BUT BETTER. HAS CHRONIC GERD. CHEST WALL AND ABDOMINAL WALL PAIN WITH  PALPATION REPRODUCES PRESENTING SYMPTOMS, SOMEWHAT. NOT CLEAR THAT GALLSTONES EXPLAIN THESE PROBLEMS. RECOMMEND EGD (TOMORROW) AND PPI. DISCUSSED WITH PATIENT AND WIFE. THANKS.  Wilhemina Bonito. Eda Keys., M.D. [facility] Division of Gastroenterology

## 2011-07-27 NOTE — ED Provider Notes (Signed)
Medical screening examination/treatment/procedure(s) were conducted as a shared visit with non-physician practitioner(s) and myself.  I personally evaluated the patient during the encounter  Flint Melter, MD 07/27/11 1558

## 2011-07-28 ENCOUNTER — Encounter (HOSPITAL_COMMUNITY)
Admission: EM | Disposition: A | Discharge status: Home / Self Care | Payer: Self-pay | Source: Ambulatory Visit | Attending: Family Medicine

## 2011-07-28 ENCOUNTER — Encounter (HOSPITAL_COMMUNITY): Payer: Self-pay | Admitting: Gastroenterology

## 2011-07-28 DIAGNOSIS — K219 Gastro-esophageal reflux disease without esophagitis: Secondary | ICD-10-CM

## 2011-07-28 HISTORY — PX: ESOPHAGOGASTRODUODENOSCOPY: SHX5428

## 2011-07-28 LAB — BASIC METABOLIC PANEL
BUN: 8 mg/dL (ref 6–23)
CO2: 29 mEq/L (ref 19–32)
Calcium: 9.4 mg/dL (ref 8.4–10.5)
Chloride: 98 mEq/L (ref 96–112)
Creatinine, Ser: 1 mg/dL (ref 0.50–1.35)
GFR calc Af Amer: >90 mL/min (ref >90)
GFR calc non Af Amer: >90 mL/min (ref >90)
Glucose, Bld: 99 mg/dL (ref 70–99)
Potassium: 3.7 mEq/L (ref 3.5–5.1)
Sodium: 137 mEq/L (ref 135–145)

## 2011-07-28 LAB — CBC
HCT: 40.6 % (ref 39.0–52.0)
Hemoglobin: 13.9 g/dL (ref 13.0–17.0)
MCH: 27.5 pg (ref 26.0–34.0)
MCHC: 34.2 g/dL (ref 30.0–36.0)
MCV: 80.2 fL (ref 78.0–100.0)
Platelets: 124 10*3/uL — ABNORMAL LOW (ref 150–400)
RBC: 5.06 MIL/uL (ref 4.22–5.81)
RDW: 14.4 % (ref 11.5–15.5)
WBC: 7.4 10*3/uL (ref 4.0–10.5)

## 2011-07-28 LAB — GLUCOSE, CAPILLARY
Glucose-Capillary: 96 mg/dL (ref 70–99)
Glucose-Capillary: 83 mg/dL (ref 70–99)
Glucose-Capillary: 96 mg/dL (ref 70–99)

## 2011-07-28 SURGERY — EGD (ESOPHAGOGASTRODUODENOSCOPY)
Anesthesia: Moderate Sedation

## 2011-07-28 MED ORDER — DIPHENHYDRAMINE HCL 50 MG/ML IJ SOLN
INTRAMUSCULAR | Status: AC
  Filled 2011-07-28: qty 1

## 2011-07-28 MED ORDER — FENTANYL CITRATE 0.05 MG/ML IJ SOLN
INTRAMUSCULAR | Status: AC
Start: 2011-07-28 — End: 2011-07-28
  Filled 2011-07-28: qty 2

## 2011-07-28 MED ORDER — MIDAZOLAM HCL 10 MG/2ML IJ SOLN
INTRAMUSCULAR | Status: DC | PRN
  Administered 2011-07-28: 1 mg via INTRAVENOUS
  Administered 2011-07-28 (×3): 2 mg via INTRAVENOUS

## 2011-07-28 MED ORDER — MIDAZOLAM HCL 10 MG/2ML IJ SOLN
INTRAMUSCULAR | Status: AC
  Filled 2011-07-28: qty 2

## 2011-07-28 MED ORDER — HEPARIN SODIUM (PORCINE) 5000 UNIT/ML IJ SOLN
5000.0000 [IU] | Freq: Three times a day (TID) | INTRAMUSCULAR | Status: DC
  Filled 2011-07-28 (×3): qty 1

## 2011-07-28 MED ORDER — SENNA-DOCUSATE SODIUM 8.6-50 MG PO TABS: 1.0000 | ORAL_TABLET | Freq: Every day | ORAL | Status: DC

## 2011-07-28 MED ORDER — HYDROMORPHONE HCL PF 1 MG/ML IJ SOLN
1.0000 mg | Freq: Once | INTRAMUSCULAR | Status: AC
  Administered 2011-07-28: 1 mg via INTRAVENOUS

## 2011-07-28 MED ORDER — FENTANYL CITRATE 0.05 MG/ML IJ SOLN
INTRAMUSCULAR | Status: DC | PRN
  Administered 2011-07-28 (×3): 25 ug via INTRAVENOUS

## 2011-07-28 MED ORDER — OXYCODONE-ACETAMINOPHEN 5-325 MG PO TABS: 1.0000 | ORAL_TABLET | Freq: Four times a day (QID) | ORAL | Status: DC | PRN

## 2011-07-28 NOTE — Op Note (Signed)
NORMAL EGD . SEE PENTAX REPORT. SUSPECT MUSCULOSKELETAL PAIN. NO FURTHER GI WORK UP PLANNED. WILL SIGN OFF

## 2011-07-28 NOTE — Interval H&P Note (Signed)
History and Physical Interval Note:  07/28/2011 11:53 AM  Brandon [name]  has presented today for surgery, with the diagnosis of abdominal pain  The various methods of treatment have been discussed with the patient and family. After consideration of risks, benefits and other options for treatment, the patient has consented to  Procedure(s): ESOPHAGOGASTRODUODENOSCOPY (EGD) as a surgical intervention .  The patients' history has been reviewed, patient examined, no change in status, stable for surgery.  I have reviewed the patients' chart and labs.  Questions were answered to the patient's satisfaction.     [name]

## 2011-07-28 NOTE — Op Note (Signed)
[facility] [address] [place], [place]  [address]  ENDOSCOPY PROCEDURE REPORT  PATIENT:  Brandon [name], Brandon [name]  MR#:  [id number] BIRTHDATE:  [birth year], 40 yrs. old  GENDER:  male  ENDOSCOPIST:  Wilhemina Bonito. Eda Keys, MD Referred by:  Acquanetta Belling, M.D.  PROCEDURE DATE:  07/28/2011 PROCEDURE:  EGD, diagnostic 43235 ASA CLASS:  Class II INDICATIONS:  chest pain, abdominal pain, left upper quadr, GERD   MEDICATIONS:   Fentanyl 75 mcg IV, Versed 7 mg IV TOPICAL ANESTHETIC:  Cetacaine Spray  DESCRIPTION OF PROCEDURE:   After the risks benefits and alternatives of the procedure were thoroughly explained, informed consent was obtained.  The Pentax Gastroscope [id number] endoscope was introduced through the mouth and advanced to the second portion of the duodenum, without limitations.  The instrument was slowly withdrawn as the mucosa was fully examined. <<PROCEDUREIMAGES>>  The upper, middle, and distal third of the esophagus were carefully inspected and no abnormalities were noted. The z-line was well seen at the GEJ. The endoscope was pushed into the fundus which was normal including a retroflexed view. The antrum,gastric body, first and second part of the duodenum were unremarkable. Retroflexed views revealed no abnormalities.    The scope was then withdrawn from the patient and the procedure completed.  COMPLICATIONS:  None  ENDOSCOPIC IMPRESSION: 1) Normal EGD 2) Gerd 3) musculoskeltal chest and abdominal pains  RECOMMENDATIONS: 1) Anti-reflux regimen to be followed 2) Will sign off  ______________________________ Wilhemina Bonito. Eda Keys, MD  CC:  Georgian Co, PA; Acquanetta Belling, MD; Avel Peace, MD; The Patient  n. eSIGNED:   Wilhemina Bonito. Eda Keys at 07/28/2011 12:30 PM  Donne Anon, [phone]

## 2011-07-28 NOTE — Progress Notes (Signed)
Documentation 1:51 PM. 07/28/2011 Edd Arbour MD  Reviewed abdominal CT from the 20th with Dr. Gery Pray the radiologist reading CT scans today. The opacity in the pelvis is a phlebolith and too posterior to be a ureteral calculi.  Edd Arbour MD

## 2011-07-28 NOTE — Progress Notes (Signed)
Patient ID: Brandon [name], male   DOB: January 11, 1971, 41 y.o.   MRN: [id number] PGY-1 Daily Progress Note Family Medicine Teaching Service Jackquline Branca M. Carianna Lague, MD Service Pager: [phone]  Subjective: Patient reported pain overnight. Requiring Dilaudid every 3 hours. States the pain medication "isn't working as well." Patient scheduled for EGD today.  Objective: Vital signs in last 24 hours: Temp:  [97.4 F (36.3 C)-98.3 F (36.8 C)] 97.6 F (36.4 C) (01/22 0500) Pulse Rate:  [53-81] 81  (01/22 0500) Resp:  [19-20] 20  (01/22 0500) BP: (103-125)/(70-75) 123/71 mmHg (01/22 0500) SpO2:  [94 %-98 %] 96 % (01/22 0500) Weight:  [260 lb (117.935 kg)] 260 lb (117.935 kg) (01/22 0500) Weight change: 11.8 oz (0.335 kg) Last BM Date: 07/27/11  Intake/Output from previous day: 01/21 0701 - 01/22 0700 In: 2029.2 [P.O.:480; I.V.:1549.2] Out: 1125 [Urine:1125] Intake/Output this shift: Total I/O In: -  Out: 600 [Urine:600]  General appearance: alert, cooperative and mild distress Head: Normocephalic, without obvious abnormality, atraumatic Resp: clear to auscultation bilaterally Chest wall: Midline tenderness Cardio: regular rate and rhythm GI: Exquisite tenderness to gentle touch diffusely but most notable in LUQ and epigastric area. Grimaces with palpation of entire abdomen. No rebound tenderness. Extremities: extremities normal, atraumatic, no cyanosis or edema Neurologic: Grossly normal  Lab Results:  Basename 07/28/11 0530 07/27/11 0229  WBC 7.4 8.4  HGB 13.9 14.3  HCT 40.6 41.4  PLT 124* 126*   BMET  Basename 07/28/11 0530 07/27/11 0229  NA 137 139  K 3.7 3.3*  CL 98 103  CO2 29 28  GLUCOSE 99 91  BUN 8 11  CREATININE 1.00 0.79  CALCIUM 9.4 9.4    Studies/Results: US Abdomen Complete  07/26/2011  *RADIOLOGY REPORT*  Clinical Data:  Left upper quadrant pain.  COMPLETE ABDOMINAL ULTRASOUND  Comparison:  CT abdomen and pelvis 07/26/2011 at 1652 hours and abdominal  ultrasound 07/03/2011.  Findings:  Gallbladder:  As on the prior studies, a stone is identified in the neck of the gallbladder measuring 1.8 cm.  No pericholecystic fluid or wall thickening.  Sonographer reports negative Murphy's sign.  Common bile duct:  Measures 0.4 cm.  Liver:  The liver appears heterogeneous with some increased echogenicity.  No focal lesion.  IVC:  Appears normal.  Pancreas:  No focal abnormality seen.  Spleen:  No focal lesion.  Measures 12.0 x 15.6 x 7.2 cm for a volume of 703 ccs compared to 910 ccs on the prior exam.  Right Kidney:  Measures 12.0 cm and appears normal.  Left Kidney:  Measures 15.2 cm and appears normal.  Abdominal aorta:  No aneurysm identified.  IMPRESSION:  1.  Single gallstone in the gallbladder neck without evidence of cholecystitis. 2.  Persistent but decreased splenomegaly.  Original Report Authenticated By: Bernadene Bell. Maricela Curet, M.D.   Ct Abdomen Pelvis W Contrast  07/26/2011  *RADIOLOGY REPORT*  Clinical Data: Abdominal pain.  CT ABDOMEN AND PELVIS WITH CONTRAST  Technique:  Multidetector CT imaging of the abdomen and pelvis was performed following the standard protocol during bolus administration of intravenous contrast.  Contrast: OMNIPAQUE IOHEXOL 300 MG/ML IV SOLN  Comparison: Abdominal ultrasound 07/03/2011.  Findings: Mild atelectasis is seen in the left lung base.  Imaged lung parenchyma is otherwise clear.  No pleural or pericardial effusion.  A single stone is seen in the neck of the gallbladder.  No pericholecystic inflammatory change is identified.  The spleen is unremarkable.  The liver is low attenuating consistent with fatty  change.  No focal liver lesion.  The biliary tree, adrenal glands, pancreas and kidneys appear normal.  Seminal vesicles, prostate gland urinary bladder are unremarkable.  The stomach, small and large bowel and appendix all appear normal.  The patient has a unilateral pars interarticularis defects on the right at L5.  No  anterolisthesis.  No other focal bony abnormality.  IMPRESSION:  1.  No acute finding. 2.  Fatty infiltration of the liver. 3.  Unilateral pars interarticularis defect on the right at L5.  No anterolisthesis. 4.  Single gallstone is identified.  No evidence of cholecystitis.  Original Report Authenticated By: Bernadene Bell. Maricela Curet, M.D.   Dg Chest Port 1 View  07/26/2011  *RADIOLOGY REPORT*  Clinical Data: Substernal chest pain  PORTABLE CHEST - 1 VIEW  Comparison: 07/03/2011  Findings: Cardiac leads overlie the chest.  Heart size is normal. Lung volumes are low but clear.  No pleural effusion.  No acute osseous finding.  IMPRESSION: Normal chest.  Original Report Authenticated By: Harrel Lemon, M.D.    Medications:  I have reviewed the patient's current medications. Scheduled:    . aspirin EC  81 mg Oral Daily  . hydrochlorothiazide  12.5 mg Oral Daily  .  HYDROmorphone (DILAUDID) injection  1 mg Intravenous Once  . insulin aspart  0-5 Units Subcutaneous QHS  . insulin aspart  0-9 Units Subcutaneous TID WC  . lisinopril  10 mg Oral Daily  . pantoprazole  40 mg Oral BID AC  . DISCONTD: heparin subcutaneous  5,000 Units Subcutaneous Q8H   Continuous:    . 0.45 % NaCl with KCl 20 mEq / L 125 mL/hr at 07/28/11 1610   RUE:AVWU thickener, HYDROmorphone (DILAUDID) injection, ondansetron (ZOFRAN) IV, ondansetron, polyethylene glycol, simethicone  Assessment/Plan: 41 yo M presenting with acute onset abdominal pain   1. Abdominal pain: LUQ on admission, now diffusely. Patient with recently history of mono with splenomegaly on abd [place] on 07/03/11. Abd CT today did not reveal splenomegaly. Abd U/S showed decreased splenomegaly as compared to recent exam WBC, lipase, lactic acid and procalcitonin all within normal limits.  - Continue Dilaudid 1mg  prn for pain, patient states this is not working as well now. Will consider changing regimen and plan to change to PO soon. - Surgery has evaluated  and no need for surgery at this time.  - GI plans to proceed with EGD today to evaluate for PUD given his recent NSAID use. Therefore he has been NPO after midnight and Heparin held. Diet and DVT ppx will be restarted after procedure. We appreciate GI's help in the care for the patient. - Continue to monitor for any changes   2. Chest pain: Patient began having chest pain after vomiting. Does not appear to be typical for cardiac. Received GI cocktail in the ED as well as Dilaudid with some relief. Cardiac enzymes negative x3 - Pain reproducible on palpation - Stable. Will d/c Tele today.  3. DM: On Metformin at home. CBG stable since admission - Holding Metformin. Patient received contrast CT scan, therefore this will not be restarted.  - Sensitive SSI and CBG checks qac/qhs   4. HTN: On Lisinopril HCTZ at home. States his BP has been stable at home.  - Continue home medication.  - Continue to monitor BP and HR   5. Hypercalcemia: Calcium on admission elevated to 10.7 but decreased yesterday to 9.4 after fluids.  - UA did not have protein therefore no SPEP, UPEP done.  6.  MS: Not currently on any therapy. Followed by Neurology. Current chief complaint not evidently related to MS, therefore this will not be addressed during admission.  - PT/OT consults prior to discharge given his weakness and gait abnormality. Will wait for his pain to be better controlled to allow for better evaluation by PT/OT. Likely consult today after EGD.  7. FEN/GI: NPO. On Protonix PO BID. Will advance diet after EGD as recommended by GI. 8. PPx: Heparin SQ- Restart after scope 9. Dispo: Pending overall clinical improvement. Will continue to follow GI recommendations.   LOS: 2 days   Debie Ashline 07/28/2011, 8:33 AM

## 2011-07-28 NOTE — Discharge Summary (Signed)
Physician Discharge Summary  Patient ID: Brandon [name] MRN: [id number] DOB/AGE: [birth year] 40 y.o.  Admit date: 07/26/2011 Discharge date: 07/28/2011  Admission Diagnoses: LUQ Abdominal pain  Discharge Diagnoses:  Active Problems:  Splenomegaly  Abdominal pain, LUQ  Cholelithiasis  Esophageal reflux   Discharged Condition: stable  Hospital Course: 41 yo M presenting with acute onset abdominal pain. In brief, his hospital problems are as follows: 1. Abdominal pain: LUQ on admission with associated vomiting and chest pain. Tried Gas-X and Tums at home with little relief. Patient with recently history of mono with splenomegaly on abd [place] on 07/03/11. Abd CT on admission did not reveal splenomegaly. Abd U/S showed decreased splenomegaly as compared to recent exam WBC, lipase, lactic acid and procalcitonin all within normal limits. Required Dilaudid for pain, but was not discharged home on pain medication. Surgery evaluated and no need for surgery at this time. GI evaluated patient and performed EGD which was unremarkable. His pain is likely secondary to IBS. He has history of intermittent loose stools as well as some dairy intolerance. This episode started after eating a large greasy meal. Patient informed to avoid irritating foods and take Gas-X as needed. He will follow-up with his PCP within 1-2 weeks.   2. Chest pain: Patient began having chest pain after vomiting. Does not appear to be typical for cardiac. Received GI cocktail in the ED as well as Dilaudid with some relief. Cardiac enzymes negative x3. Remained stable on telemetry throughout hospital admission.  3. DM: On Metformin at home. 40 stable since admission Held Metformin since patient received contrast CT scan. CBG stable on SSI. Restarted on Metformin at discharge.  4. HTN: On Lisinopril HCTZ at home. States his BP has been stable at home, and remained stable here. Discharged home on same  5. Hypercalcemia: Calcium on admission  elevated to 10.7. Patient states he had taken 4 Tums and he was also slightly dehydrated. Calcium was 9.4 after fluids. We performed UA which did not have protein therefore no SPEP, UPEP done.  6. MS: Patient is followed by Neurology but is not being treated at this time. This was not addressed during this hospitalization.   Consults: GI and general surgery  Significant Diagnostic Studies:  CBC    Component Value Date/Time   WBC 7.4 07/28/2011 0530   RBC 5.06 07/28/2011 0530   HGB 13.9 07/28/2011 0530   HCT 40.6 07/28/2011 0530   PLT 124* 07/28/2011 0530   MCV 80.2 07/28/2011 0530   MCH 27.5 07/28/2011 0530   MCHC 34.2 07/28/2011 0530   RDW 14.4 07/28/2011 0530   LYMPHSABS 1.9 07/26/2011 1331   MONOABS 0.4 07/26/2011 1331   EOSABS 0.0 07/26/2011 1331   BASOSABS 0.0 07/26/2011 1331   US Abdomen Complete 07/26/2011  IMPRESSION:  1.  Single gallstone in the gallbladder neck without evidence of cholecystitis. 2.  Persistent but decreased splenomegaly.   Ct Abdomen Pelvis W Contrast 07/26/2011  IMPRESSION:  1.  No acute finding. 2.  Fatty infiltration of the liver. 3.  Unilateral pars interarticularis defect on the right at L5.  No anterolisthesis. 4.  Single gallstone is identified.  No evidence of cholecystitis.   Dg Chest Port 1 View 07/26/2011  IMPRESSION: Normal chest.   Treatments: IV hydration and procedures: EGD  Discharge Exam: Blood pressure 127/81, pulse 20, temperature 98.3 F (36.8 C), temperature source Oral, resp. rate 22, weight 260 lb (117.935 kg), SpO2 99.00%. Please see daily progress note  Recommendations for follow-up: - Please  discuss IBS with him and ways to avoid flares - Evaluate his pain - Could consider starting SSRI for pain?  Disposition: Home or Self Care   Medication List  As of 07/28/2011  2:25 PM   STOP taking these medications         fish oil-omega-3 fatty acids 1000 MG capsule      MIRALAX PO         TAKE these medications         aspirin EC  81 MG tablet   Take 81 mg by mouth daily.      calcium carbonate 500 MG chewable tablet   Commonly known as: TUMS - dosed in mg elemental calcium   Chew 1 tablet by mouth daily as needed. For upset stomach      GAS-X PO   Take 1 tablet by mouth daily as needed. For gas relieve      lisinopril-hydrochlorothiazide 10-12.5 MG per tablet   Commonly known as: PRINZIDE,ZESTORETIC   Take 1 tablet by mouth daily.      metFORMIN 500 MG tablet   Commonly known as: GLUCOPHAGE   Take 1,000 mg by mouth 2 (two) times daily with a meal.      naproxen sodium 220 MG tablet   Commonly known as: ANAPROX   Take 440 mg by mouth daily as needed. For pain      oxyCODONE-acetaminophen 5-325 MG per tablet   Commonly known as: PERCOCET   Take 1 tablet by mouth every 6 (six) hours as needed for pain.      sennosides-docusate sodium 8.6-50 MG tablet   Commonly known as: SENOKOT-S   Take 1 tablet by mouth daily.           Follow-up Information    Schedule an appointment as soon as possible for a visit with [facility].

## 2011-07-28 NOTE — Progress Notes (Addendum)
PGY-1 Daily Progress Addendum Family Medicine Teaching Service Aldine Contes. Marti Sleigh, MD Service Pager: [phone]   S: Patient continues to complain of 4-5/10 LUQ pain. New complaint of RLQ pain starting several hours ago. Says it feels like he needs to have a bowel movement. He is passing gas but not any stool. Says had a BM 2 days ago. Pain 4-5/10. Says relieved if he can pull his waist band off and just rest. Received Miralax and simethicone. Has been about 1.5 hours since last dilaudid. Says it is still somewhat controlling his pain.   O: BP 125/73  Pulse 79  Temp(Src) 97.4 F (36.3 C) (Oral)  Resp 20  Wt 259 lb 4.2 oz (117.6 kg)  SpO2 94% General appearance: alert, cooperative. No distress at rest. Sleeping when I entered the room but easily arousable.  Cardio: regular rate and rhythm, no murmurs, rubs or gallops Resp: clear to auscultation bilaterally  GI: Exquisite tenderness to gentle touch, LUQ and epigastric area. Grimaces with palpation of entire abdomen. No rebound tenderness or guarding. bowel sounds present.  Extremities: extremities normal, atraumatic, no cyanosis or edema  Neurologic: Grossly normal  Assessment/Plan: 41 yo M presenting with acute onset abdominal pain  *reviewed VS which were stable *no signs of an acute abdomen *continue Miralax-may consider scheduling *will give 1x dose of Dilaudid 1 mg over q2 hour.  *plan for EGD tomorrow per GI.

## 2011-07-28 NOTE — Progress Notes (Signed)
I discussed with Dr Hairford.  I agree with their plans documented in their  Note for today.  

## 2011-07-29 ENCOUNTER — Encounter (HOSPITAL_COMMUNITY): Payer: Self-pay | Admitting: Internal Medicine

## 2011-07-30 NOTE — Discharge Summary (Signed)
I discussed with Dr Hairford.  I agree with their plans documented in their  Note for today.  

## 2011-08-01 ENCOUNTER — Encounter (HOSPITAL_COMMUNITY): Payer: Self-pay | Admitting: *Deleted

## 2011-08-01 ENCOUNTER — Inpatient Hospital Stay (HOSPITAL_COMMUNITY)
Admission: EM | Admit: 2011-08-01 | Discharge: 2011-08-03 | DRG: 419 | Disposition: A | Discharge status: Home / Self Care | Payer: Managed Care, Other (non HMO) | Source: Ambulatory Visit | Attending: Family Medicine | Admitting: Family Medicine

## 2011-08-01 ENCOUNTER — Other Ambulatory Visit: Payer: Self-pay

## 2011-08-01 DIAGNOSIS — I10 Essential (primary) hypertension: Secondary | ICD-10-CM | POA: Diagnosis present

## 2011-08-01 DIAGNOSIS — G35 Multiple sclerosis: Secondary | ICD-10-CM | POA: Diagnosis present

## 2011-08-01 DIAGNOSIS — K8000 Calculus of gallbladder with acute cholecystitis without obstruction: Principal | ICD-10-CM | POA: Diagnosis present

## 2011-08-01 DIAGNOSIS — E119 Type 2 diabetes mellitus without complications: Secondary | ICD-10-CM | POA: Diagnosis present

## 2011-08-01 DIAGNOSIS — K810 Acute cholecystitis: Secondary | ICD-10-CM

## 2011-08-01 DIAGNOSIS — F3289 Other specified depressive episodes: Secondary | ICD-10-CM | POA: Diagnosis present

## 2011-08-01 DIAGNOSIS — R079 Chest pain, unspecified: Secondary | ICD-10-CM | POA: Diagnosis present

## 2011-08-01 DIAGNOSIS — R112 Nausea with vomiting, unspecified: Secondary | ICD-10-CM

## 2011-08-01 DIAGNOSIS — Z7982 Long term (current) use of aspirin: Secondary | ICD-10-CM

## 2011-08-01 DIAGNOSIS — Z8673 Personal history of transient ischemic attack (TIA), and cerebral infarction without residual deficits: Secondary | ICD-10-CM

## 2011-08-01 DIAGNOSIS — G4730 Sleep apnea, unspecified: Secondary | ICD-10-CM | POA: Diagnosis present

## 2011-08-01 DIAGNOSIS — F329 Major depressive disorder, single episode, unspecified: Secondary | ICD-10-CM

## 2011-08-01 DIAGNOSIS — F411 Generalized anxiety disorder: Secondary | ICD-10-CM | POA: Diagnosis present

## 2011-08-01 DIAGNOSIS — K219 Gastro-esophageal reflux disease without esophagitis: Secondary | ICD-10-CM | POA: Diagnosis present

## 2011-08-01 DIAGNOSIS — Z79899 Other long term (current) drug therapy: Secondary | ICD-10-CM

## 2011-08-01 DIAGNOSIS — E161 Other hypoglycemia: Secondary | ICD-10-CM | POA: Diagnosis present

## 2011-08-01 NOTE — ED Notes (Signed)
EKG completed at 2318 and given to Dr. Lynelle Doctor along with OLD ekg.

## 2011-08-01 NOTE — ED Notes (Signed)
The pt has had generalized chest pain for 2 hours .   Some sob

## 2011-08-02 ENCOUNTER — Other Ambulatory Visit (INDEPENDENT_AMBULATORY_CARE_PROVIDER_SITE_OTHER): Payer: Self-pay | Admitting: Surgery

## 2011-08-02 ENCOUNTER — Emergency Department (HOSPITAL_COMMUNITY): Payer: Managed Care, Other (non HMO)

## 2011-08-02 ENCOUNTER — Encounter (HOSPITAL_COMMUNITY): Payer: Self-pay | Admitting: Sports Medicine

## 2011-08-02 ENCOUNTER — Encounter (HOSPITAL_COMMUNITY): Payer: Self-pay | Admitting: Anesthesiology

## 2011-08-02 ENCOUNTER — Encounter (HOSPITAL_COMMUNITY)
Admission: EM | Disposition: A | Discharge status: Home / Self Care | Payer: Self-pay | Source: Ambulatory Visit | Attending: Family Medicine

## 2011-08-02 ENCOUNTER — Inpatient Hospital Stay (HOSPITAL_COMMUNITY): Payer: Managed Care, Other (non HMO) | Admitting: Anesthesiology

## 2011-08-02 DIAGNOSIS — K8000 Calculus of gallbladder with acute cholecystitis without obstruction: Secondary | ICD-10-CM | POA: Diagnosis present

## 2011-08-02 HISTORY — PX: CHOLECYSTECTOMY: SHX55

## 2011-08-02 LAB — RAPID URINE DRUG SCREEN, HOSP PERFORMED
Amphetamines: NOT DETECTED
Barbiturates: POSITIVE — AB
Benzodiazepines: NOT DETECTED
Cocaine: NOT DETECTED
Opiates: POSITIVE — AB
Tetrahydrocannabinol: NOT DETECTED

## 2011-08-02 LAB — CBC
HCT: 39 % (ref 39.0–52.0)
HCT: 41.1 % (ref 39.0–52.0)
Hemoglobin: 13.3 g/dL (ref 13.0–17.0)
Hemoglobin: 14.3 g/dL (ref 13.0–17.0)
MCH: 27.3 pg (ref 26.0–34.0)
MCH: 27.6 pg (ref 26.0–34.0)
MCHC: 34.1 g/dL (ref 30.0–36.0)
MCHC: 34.8 g/dL (ref 30.0–36.0)
MCV: 79.3 fL (ref 78.0–100.0)
MCV: 80.1 fL (ref 78.0–100.0)
Platelets: 132 10*3/uL — ABNORMAL LOW (ref 150–400)
Platelets: 145 10*3/uL — ABNORMAL LOW (ref 150–400)
RBC: 4.87 MIL/uL (ref 4.22–5.81)
RBC: 5.18 MIL/uL (ref 4.22–5.81)
RDW: 13.7 % (ref 11.5–15.5)
RDW: 14 % (ref 11.5–15.5)
WBC: 5.8 10*3/uL (ref 4.0–10.5)
WBC: 6.9 10*3/uL (ref 4.0–10.5)

## 2011-08-02 LAB — POCT I-STAT TROPONIN I: Troponin i, poc: 0.01 ng/mL (ref 0.00–0.08)

## 2011-08-02 LAB — CARDIAC PANEL(CRET KIN+CKTOT+MB+TROPI)
CK, MB: 1.8 ng/mL (ref 0.3–4.0)
Relative Index: 1.8 (ref 0.0–2.5)
Total CK: 101 U/L (ref 7–232)
Troponin I: <0.3 ng/mL (ref <0.30)

## 2011-08-02 LAB — COMPREHENSIVE METABOLIC PANEL
ALT: 52 U/L (ref 0–53)
AST: 21 U/L (ref 0–37)
Albumin: 4 g/dL (ref 3.5–5.2)
Alkaline Phosphatase: 67 U/L (ref 39–117)
BUN: 13 mg/dL (ref 6–23)
CO2: 30 mEq/L (ref 19–32)
Calcium: 10 mg/dL (ref 8.4–10.5)
Chloride: 100 mEq/L (ref 96–112)
Creatinine, Ser: 0.94 mg/dL (ref 0.50–1.35)
GFR calc Af Amer: >90 mL/min (ref >90)
GFR calc non Af Amer: >90 mL/min (ref >90)
Glucose, Bld: 90 mg/dL (ref 70–99)
Potassium: 3.7 mEq/L (ref 3.5–5.1)
Sodium: 140 mEq/L (ref 135–145)
Total Bilirubin: 0.3 mg/dL (ref 0.3–1.2)
Total Protein: 7.3 g/dL (ref 6.0–8.3)

## 2011-08-02 LAB — DIFFERENTIAL
Basophils Absolute: 0 10*3/uL (ref 0.0–0.1)
Basophils Relative: 0 % (ref 0–1)
Eosinophils Absolute: 0.2 10*3/uL (ref 0.0–0.7)
Eosinophils Relative: 3 % (ref 0–5)
Lymphocytes Relative: 35 % (ref 12–46)
Lymphs Abs: 2.4 10*3/uL (ref 0.7–4.0)
Monocytes Absolute: 0.6 10*3/uL (ref 0.1–1.0)
Monocytes Relative: 9 % (ref 3–12)
Neutro Abs: 3.6 10*3/uL (ref 1.7–7.7)
Neutrophils Relative %: 53 % (ref 43–77)

## 2011-08-02 LAB — CREATININE, SERUM
Creatinine, Ser: 0.91 mg/dL (ref 0.50–1.35)
GFR calc Af Amer: >90 mL/min (ref >90)
GFR calc non Af Amer: >90 mL/min (ref >90)

## 2011-08-02 LAB — SURGICAL PCR SCREEN
MRSA, PCR: NEGATIVE
Staphylococcus aureus: NEGATIVE

## 2011-08-02 LAB — CK TOTAL AND CKMB (NOT AT ARMC)
CK, MB: 1.9 ng/mL (ref 0.3–4.0)
Relative Index: 1.6 (ref 0.0–2.5)
Total CK: 118 U/L (ref 7–232)

## 2011-08-02 LAB — GLUCOSE, CAPILLARY: Glucose-Capillary: 73 mg/dL (ref 70–99)

## 2011-08-02 SURGERY — LAPAROSCOPIC CHOLECYSTECTOMY
Anesthesia: General | Site: Abdomen | Wound class: Clean Contaminated

## 2011-08-02 MED ORDER — HYDROMORPHONE HCL PF 1 MG/ML IJ SOLN
0.2500 mg | INTRAMUSCULAR | Status: DC | PRN
  Administered 2011-08-02: 0.5 mg via INTRAVENOUS
  Administered 2011-08-02: 1 mg via INTRAVENOUS
  Administered 2011-08-02: 0.5 mg via INTRAVENOUS

## 2011-08-02 MED ORDER — KETOROLAC TROMETHAMINE 30 MG/ML IJ SOLN
INTRAMUSCULAR | Status: DC | PRN
  Administered 2011-08-02: 30 mg via INTRAVENOUS

## 2011-08-02 MED ORDER — LACTATED RINGERS IV SOLN
INTRAVENOUS | Status: DC | PRN
  Administered 2011-08-02: 09:00:00 via INTRAVENOUS

## 2011-08-02 MED ORDER — ACETAMINOPHEN 10 MG/ML IV SOLN
INTRAVENOUS | Status: AC
  Filled 2011-08-02: qty 100

## 2011-08-02 MED ORDER — NEOSTIGMINE METHYLSULFATE 1 MG/ML IJ SOLN
INTRAMUSCULAR | Status: DC | PRN
  Administered 2011-08-02: 4 mg via INTRAVENOUS

## 2011-08-02 MED ORDER — SODIUM CHLORIDE 0.9 % IV SOLN
INTRAVENOUS | Status: DC
  Administered 2011-08-02: 08:00:00 via INTRAVENOUS

## 2011-08-02 MED ORDER — LIDOCAINE HCL 4 % MT SOLN
OROMUCOSAL | Status: DC | PRN
  Administered 2011-08-02: 4 mL via TOPICAL

## 2011-08-02 MED ORDER — FENTANYL CITRATE 0.05 MG/ML IJ SOLN
INTRAMUSCULAR | Status: DC | PRN
  Administered 2011-08-02 (×2): 50 ug via INTRAVENOUS
  Administered 2011-08-02: 100 ug via INTRAVENOUS
  Administered 2011-08-02: 50 ug via INTRAVENOUS

## 2011-08-02 MED ORDER — IBUPROFEN 600 MG PO TABS
600.0000 mg | ORAL_TABLET | Freq: Four times a day (QID) | ORAL | Status: DC | PRN
  Administered 2011-08-02 – 2011-08-03 (×2): 600 mg via ORAL
  Filled 2011-08-02 (×2): qty 1

## 2011-08-02 MED ORDER — ONDANSETRON HCL 4 MG PO TABS: 4.0000 mg | ORAL_TABLET | Freq: Four times a day (QID) | ORAL | Status: DC | PRN

## 2011-08-02 MED ORDER — OXYCODONE-ACETAMINOPHEN 5-325 MG PO TABS
1.0000 | ORAL_TABLET | ORAL | Status: DC | PRN
  Administered 2011-08-02 – 2011-08-03 (×4): 2 via ORAL
  Filled 2011-08-02 (×4): qty 2

## 2011-08-02 MED ORDER — ENOXAPARIN SODIUM 40 MG/0.4ML ~~LOC~~ SOLN
40.0000 mg | SUBCUTANEOUS | Status: DC
  Filled 2011-08-02 (×2): qty 0.4

## 2011-08-02 MED ORDER — HEPARIN SODIUM (PORCINE) 5000 UNIT/ML IJ SOLN
5000.0000 [IU] | Freq: Three times a day (TID) | INTRAMUSCULAR | Status: DC
  Filled 2011-08-02 (×3): qty 1

## 2011-08-02 MED ORDER — VANCOMYCIN HCL IN DEXTROSE 1-5 GM/200ML-% IV SOLN
1000.0000 mg | INTRAVENOUS | Status: DC
  Filled 2011-08-02: qty 200

## 2011-08-02 MED ORDER — ONDANSETRON HCL 4 MG/2ML IJ SOLN
4.0000 mg | Freq: Four times a day (QID) | INTRAMUSCULAR | Status: DC | PRN
  Administered 2011-08-02: 4 mg via INTRAVENOUS
  Filled 2011-08-02: qty 2

## 2011-08-02 MED ORDER — BUPIVACAINE-EPINEPHRINE 0.25% -1:200000 IJ SOLN
INTRAMUSCULAR | Status: DC | PRN
  Administered 2011-08-02: 20 mL

## 2011-08-02 MED ORDER — ONDANSETRON HCL 4 MG/2ML IJ SOLN
INTRAMUSCULAR | Status: DC | PRN
  Administered 2011-08-02: 4 mg via INTRAVENOUS

## 2011-08-02 MED ORDER — HYDROMORPHONE HCL PF 1 MG/ML IJ SOLN
1.0000 mg | Freq: Once | INTRAMUSCULAR | Status: AC
  Administered 2011-08-02: 1 mg via INTRAVENOUS
  Filled 2011-08-02: qty 1

## 2011-08-02 MED ORDER — HYDROMORPHONE HCL PF 1 MG/ML IJ SOLN
2.0000 mg | INTRAMUSCULAR | Status: DC | PRN
  Administered 2011-08-02: 2 mg via INTRAVENOUS
  Administered 2011-08-02 (×2): 1 mg via INTRAVENOUS
  Filled 2011-08-02: qty 2
  Filled 2011-08-02 (×2): qty 1

## 2011-08-02 MED ORDER — ONDANSETRON HCL 4 MG/2ML IJ SOLN: 4.0000 mg | Freq: Four times a day (QID) | INTRAMUSCULAR | Status: DC | PRN

## 2011-08-02 MED ORDER — HYDROMORPHONE HCL PF 1 MG/ML IJ SOLN
1.0000 mg | INTRAMUSCULAR | Status: DC | PRN
  Administered 2011-08-02: 1 mg via INTRAVENOUS
  Filled 2011-08-02: qty 1

## 2011-08-02 MED ORDER — SODIUM CHLORIDE 0.9 % IR SOLN
Status: DC | PRN
  Administered 2011-08-02: 1

## 2011-08-02 MED ORDER — PROPOFOL 10 MG/ML IV EMUL
INTRAVENOUS | Status: DC | PRN
  Administered 2011-08-02: 300 mg via INTRAVENOUS

## 2011-08-02 MED ORDER — ROCURONIUM BROMIDE 100 MG/10ML IV SOLN
INTRAVENOUS | Status: DC | PRN
  Administered 2011-08-02: 40 mg via INTRAVENOUS

## 2011-08-02 MED ORDER — ACETAMINOPHEN 10 MG/ML IV SOLN
INTRAVENOUS | Status: DC | PRN
  Administered 2011-08-02: 1000 mg via INTRAVENOUS

## 2011-08-02 MED ORDER — GLYCOPYRROLATE 0.2 MG/ML IJ SOLN
INTRAMUSCULAR | Status: DC | PRN
  Administered 2011-08-02: .6 mg via INTRAVENOUS

## 2011-08-02 MED ORDER — HYDRALAZINE HCL 20 MG/ML IJ SOLN
5.0000 mg | INTRAMUSCULAR | Status: DC | PRN
  Filled 2011-08-02: qty 0.25

## 2011-08-02 MED ORDER — HYDROMORPHONE HCL PF 1 MG/ML IJ SOLN
INTRAMUSCULAR | Status: AC
  Administered 2011-08-02: 1 mg via INTRAVENOUS
  Filled 2011-08-02: qty 1

## 2011-08-02 MED ORDER — POTASSIUM CHLORIDE IN NACL 20-0.9 MEQ/L-% IV SOLN
INTRAVENOUS | Status: DC
  Administered 2011-08-02 (×2): via INTRAVENOUS
  Filled 2011-08-02 (×4): qty 1000

## 2011-08-02 MED ORDER — ONDANSETRON HCL 4 MG/2ML IJ SOLN
4.0000 mg | Freq: Once | INTRAMUSCULAR | Status: AC
Start: 2011-08-02 — End: 2011-08-02
  Administered 2011-08-02: 4 mg via INTRAVENOUS
  Filled 2011-08-02: qty 2

## 2011-08-02 MED ORDER — ONDANSETRON HCL 4 MG/2ML IJ SOLN: 4.0000 mg | Freq: Once | INTRAMUSCULAR | Status: DC | PRN

## 2011-08-02 SURGICAL SUPPLY — 37 items
APL SKNCLS STERI-STRIP NONHPOA (GAUZE/BANDAGES/DRESSINGS) ×2
APPLIER CLIP 5 13 M/L LIGAMAX5 (MISCELLANEOUS) ×3
APR CLP MED LRG 5 ANG JAW (MISCELLANEOUS) ×2
BAG SPEC RTRVL LRG 6X4 10 (ENDOMECHANICALS) ×2
BANDAGE ADHESIVE 1X3 (GAUZE/BANDAGES/DRESSINGS) ×12 IMPLANT
BANDAID FLEXIBLE 1X3 (GAUZE/BANDAGES/DRESSINGS) ×2 IMPLANT
BENZOIN TINCTURE PRP APPL 2/3 (GAUZE/BANDAGES/DRESSINGS) ×3 IMPLANT
CANISTER SUCTION 2500CC (MISCELLANEOUS) ×3 IMPLANT
CHLORAPREP W/TINT 26ML (MISCELLANEOUS) ×3 IMPLANT
CLIP APPLIE 5 13 M/L LIGAMAX5 (MISCELLANEOUS) ×2 IMPLANT
CLOSURE STERI STRIP 1/2 X4 (GAUZE/BANDAGES/DRESSINGS) ×2 IMPLANT
CLOTH BEACON ORANGE TIMEOUT ST (SAFETY) ×3 IMPLANT
COVER MAYO STAND STRL (DRAPES) IMPLANT
COVER SURGICAL LIGHT HANDLE (MISCELLANEOUS) ×3 IMPLANT
DECANTER SPIKE VIAL GLASS SM (MISCELLANEOUS) ×3 IMPLANT
DRAPE C-ARM 42X72 X-RAY (DRAPES) IMPLANT
ELECT REM PT RETURN 9FT ADLT (ELECTROSURGICAL) ×3
ELECTRODE REM PT RTRN 9FT ADLT (ELECTROSURGICAL) ×2 IMPLANT
GLOVE SURG SIGNA 7.5 PF LTX (GLOVE) ×3 IMPLANT
GOWN PREVENTION PLUS XLARGE (GOWN DISPOSABLE) ×3 IMPLANT
GOWN STRL NON-REIN LRG LVL3 (GOWN DISPOSABLE) ×9 IMPLANT
KIT BASIN OR (CUSTOM PROCEDURE TRAY) ×3 IMPLANT
KIT ROOM TURNOVER OR (KITS) ×3 IMPLANT
NS IRRIG 1000ML POUR BTL (IV SOLUTION) ×3 IMPLANT
PAD ARMBOARD 7.5X6 YLW CONV (MISCELLANEOUS) ×3 IMPLANT
POUCH SPECIMEN RETRIEVAL 10MM (ENDOMECHANICALS) ×2 IMPLANT
SCISSORS LAP 5X35 DISP (ENDOMECHANICALS) IMPLANT
SET CHOLANGIOGRAPH 5 50 .035 (SET/KITS/TRAYS/PACK) IMPLANT
SET IRRIG TUBING LAPAROSCOPIC (IRRIGATION / IRRIGATOR) ×3 IMPLANT
SLEEVE ENDOPATH XCEL 5M (ENDOMECHANICALS) ×6 IMPLANT
SPECIMEN JAR SMALL (MISCELLANEOUS) ×3 IMPLANT
SUT MON AB 4-0 PC3 18 (SUTURE) ×3 IMPLANT
TOWEL OR 17X24 6PK STRL BLUE (TOWEL DISPOSABLE) ×3 IMPLANT
TOWEL OR 17X26 10 PK STRL BLUE (TOWEL DISPOSABLE) ×3 IMPLANT
TRAY LAPAROSCOPIC (CUSTOM PROCEDURE TRAY) ×3 IMPLANT
TROCAR XCEL BLUNT TIP 100MML (ENDOMECHANICALS) ×3 IMPLANT
TROCAR XCEL NON-BLD 5MMX100MML (ENDOMECHANICALS) ×3 IMPLANT

## 2011-08-02 NOTE — Anesthesia Preprocedure Evaluation (Addendum)
Anesthesia Evaluation  Patient identified by MRN, date of birth, ID band Patient awake    Reviewed: Allergy & Precautions, H&P , NPO status , Patient's Chart, lab work & pertinent test results  Airway Mallampati: II TM Distance: >3 FB Neck ROM: full    Dental  (+) Teeth Intact   Pulmonary sleep apnea ,    Pulmonary exam normal       Cardiovascular hypertension, regular Normal    Neuro/Psych  Headaches, PSYCHIATRIC DISORDERS TIACVA, No Residual Symptoms    GI/Hepatic GERD-  ,  Endo/Other  Diabetes mellitus-, Type 2, Oral Hypoglycemic Agents  Renal/GU      Musculoskeletal   Abdominal   Peds  Hematology negative hematology ROS (+)   Anesthesia Other Findings MS  Reproductive/Obstetrics                         Anesthesia Physical Anesthesia Plan  ASA: III  Anesthesia Plan: General   Post-op Pain Management:    Induction: Intravenous  Airway Management Planned: Oral ETT  Additional Equipment:   Intra-op Plan:   Post-operative Plan: Extubation in OR  Informed Consent:   Plan Discussed with: CRNA, Anesthesiologist and Surgeon  Anesthesia Plan Comments:         Anesthesia Quick Evaluation

## 2011-08-02 NOTE — ED Provider Notes (Signed)
History     CSN: [id number]  Arrival date & time 08/01/11  2316   First MD Initiated Contact with Patient 08/02/11 0019      Chief Complaint  Patient presents with  . Chest Pain    (Consider location/radiation/quality/duration/timing/severity/associated sxs/prior treatment) The history is provided by the patient.   location right upper quadrant and epigastric area as well as lower chest. Radiates to back. Quality is sharp and constant tonight with periods of escalating pain. He had similar pain about a week ago with admission and extensive workup putting cardiac workup and EGD. At that time he had ultrasound demonstrating a single stones gallbladder but no cholecystitis. He ate cheeseburgers tonight prior to onset of pain. No history of abdominal surgeries. No nausea vomiting diarrhea. No fevers or chills.  Past Medical History  Diagnosis Date  . Hypertension   . MS (multiple sclerosis)   . Pneumonia 2010  . Hyperinsulinemia 07/04/11    "I'm on metformin cause I produce too much insulin"  . Headache   . TIA (transient ischemic attack) 2000    denies residual  . Mononucleosis, infectious, with hepatitis 06/2011  . Depression 2005  . Anxiety 2005  . Sleep apnea     uses CPAP at home  . Stroke 2000  . Diabetes mellitus 2012  . TIA (transient ischemic attack)     Past Surgical History  Procedure Date  . Tonsillectomy and adenoidectomy ~ 1980  . Myringotomy 1979; 1980    bilaterally  . Colonoscopy 2003  . Esophagogastroduodenoscopy 2003  . Esophagogastroduodenoscopy 07/28/2011    Procedure: ESOPHAGOGASTRODUODENOSCOPY (EGD);  Surgeon: [name], MD;  Location: [facility] ENDOSCOPY;  Service: Endoscopy;  Laterality: N/A;    Family History  Problem Relation Age of Onset  . Coronary artery disease Father   . Stroke Father   . Diabetes Mother     History  Substance Use Topics  . Smoking status: Former Smoker -- 3.0 packs/day for 3 years    Types: Cigarettes    Quit date:  07/07/1987  . Smokeless tobacco: Never Used  . Alcohol Use: No     07/04/11 "glass of wine or spirits once a month"      Review of Systems  Constitutional: Negative for fever and chills.  HENT: Negative for neck pain and neck stiffness.   Eyes: Negative for pain.  Respiratory: Negative for shortness of breath.   Cardiovascular: Positive for chest pain.  Gastrointestinal: Positive for abdominal pain.  Genitourinary: Negative for dysuria.  Musculoskeletal: Negative for back pain.  Skin: Negative for rash.  Neurological: Negative for headaches.  All other systems reviewed and are negative.    Allergies  Sulfa antibiotics; Cephalosporins; Penicillins; and Quinolones  Home Medications   Current Outpatient Rx  Name Route Sig Dispense Refill  . ASPIRIN EC 81 MG PO TBEC Oral Take 81 mg by mouth daily.      [name] CALCIUM CARBONATE ANTACID 500 MG PO CHEW Oral Chew 1 tablet by mouth daily as needed. For upset stomach    . LISINOPRIL-HYDROCHLOROTHIAZIDE 10-12.5 MG PO TABS Oral Take 1 tablet by mouth daily.      [name] METFORMIN HCL 500 MG PO TABS Oral Take 1,000 mg by mouth 2 (two) times daily with a meal.      . NAPROXEN SODIUM 220 MG PO TABS Oral Take 440 mg by mouth daily as needed. For pain    . OXYCODONE-ACETAMINOPHEN 5-325 MG PO TABS Oral Take 1 tablet by mouth every 6 (six)  hours as needed for pain. 30 tablet 0  . SENNA-DOCUSATE SODIUM 8.6-50 MG PO TABS Oral Take 1 tablet by mouth daily. 30 tablet 1  . GAS-X PO Oral Take 1 tablet by mouth daily as needed. For gas relieve      BP 152/86  Pulse 63  Temp(Src) 98.7 F (37.1 C) (Oral)  Resp 22  SpO2 100%  Physical Exam  Constitutional: He is oriented to person, place, and time. He appears well-developed and well-nourished.  HENT:  Head: Normocephalic and atraumatic.  Eyes: Conjunctivae and EOM are normal. Pupils are equal, round, and reactive to light.  Neck: Trachea normal. Neck supple. No thyromegaly present.  Cardiovascular:  Normal rate, regular rhythm, S1 normal, S2 normal and normal pulses.     No systolic murmur is present   No diastolic murmur is present  Pulses:      Radial pulses are 2+ on the right side, and 2+ on the left side.  Pulmonary/Chest: Effort normal and breath sounds normal. He has no wheezes. He has no rhonchi. He has no rales. He exhibits no tenderness.  Abdominal: Soft. Normal appearance and bowel sounds are normal. He exhibits no distension and no mass. There is no rebound, no guarding, no CVA tenderness and negative Murphy's sign.       Moderately Tender to palpation right upper quadrant and epigastric area. No left upper quadrant tenderness and no lower abdominal tenderness. No substernal tenderness or crepitus  Musculoskeletal:       BLE:s Calves nontender, no cords or erythema, negative Homans sign  Neurological: He is alert and oriented to person, place, and time. He has normal strength. No cranial nerve deficit or sensory deficit. GCS eye subscore is 4. GCS verbal subscore is 5. GCS motor subscore is 6.  Skin: Skin is warm and dry. No rash noted. He is not diaphoretic.  Psychiatric: His speech is normal.       Cooperative and appropriate    ED Course  Procedures (including critical care time)  Results for orders placed during the hospital encounter of 08/01/11  CBC      Component Value Range   WBC 6.9  4.0 - 10.5 (K/uL)   RBC 5.18  4.22 - 5.81 (MIL/uL)   Hemoglobin 14.3  13.0 - 17.0 (g/dL)   HCT 16.1  09.6 - 04.5 (%)   MCV 79.3  78.0 - 100.0 (fL)   MCH 27.6  26.0 - 34.0 (pg)   MCHC 34.8  30.0 - 36.0 (g/dL)   RDW 40.9  81.1 - 91.4 (%)   Platelets 145 (*) 150 - 400 (K/uL)  DIFFERENTIAL      Component Value Range   Neutrophils Relative 53  43 - 77 (%)   Neutro Abs 3.6  1.7 - 7.7 (K/uL)   Lymphocytes Relative 35  12 - 46 (%)   Lymphs Abs 2.4  0.7 - 4.0 (K/uL)   Monocytes Relative 9  3 - 12 (%)   Monocytes Absolute 0.6  0.1 - 1.0 (K/uL)   Eosinophils Relative 3  0 - 5 (%)    Eosinophils Absolute 0.2  0.0 - 0.7 (K/uL)   Basophils Relative 0  0 - 1 (%)   Basophils Absolute 0.0  0.0 - 0.1 (K/uL)  COMPREHENSIVE METABOLIC PANEL      Component Value Range   Sodium 140  135 - 145 (mEq/L)   Potassium 3.7  3.5 - 5.1 (mEq/L)   Chloride 100  96 - 112 (mEq/L)  CO2 30  19 - 32 (mEq/L)   Glucose, Bld 90  70 - 99 (mg/dL)   BUN 13  6 - 23 (mg/dL)   Creatinine, Ser 1.61  0.50 - 1.35 (mg/dL)   Calcium 09.6  8.4 - 10.5 (mg/dL)   Total Protein 7.3  6.0 - 8.3 (g/dL)   Albumin 4.0  3.5 - 5.2 (g/dL)   AST 21  0 - 37 (U/L)   ALT 52  0 - 53 (U/L)   Alkaline Phosphatase 67  39 - 117 (U/L)   Total Bilirubin 0.3  0.3 - 1.2 (mg/dL)   GFR calc non Af Amer >90  >90 (mL/min)   GFR calc Af Amer >90  >90 (mL/min)  CK TOTAL AND CKMB      Component Value Range   Total CK 118  7 - 232 (U/L)   CK, MB 1.9  0.3 - 4.0 (ng/mL)   Relative Index 1.6  0.0 - 2.5   POCT I-STAT TROPONIN I      Component Value Range   Troponin i, poc 0.01  0.00 - 0.08 (ng/mL)   Comment 3            Dg Chest 2 View  07/03/2011  *RADIOLOGY REPORT*  Clinical Data: EKG changes, hypertension  CHEST - 2 VIEW  Comparison: 06/11/2009  Findings: Cardiomediastinal silhouette is unremarkable.  No acute infiltrate or pleural effusion.  No pulmonary edema.  Probable small calcified granuloma in the right lower lobe. Mild degenerative changes thoracic spine.  IMPRESSION: No active disease.  Original Report Authenticated By: Natasha [name], M.D.   US Abdomen Complete  08/02/2011  *RADIOLOGY REPORT*  Clinical Data:  Chest pain and generalized abdominal pain.  COMPLETE ABDOMINAL ULTRASOUND  Comparison:  07/26/2011  Findings:  Gallbladder:  Stone in the gallbladder neck with sludge layering in the gallbladder.  Mild gallbladder wall thickening.  Murphy's sign is negative. The appearance of a gallstone is stable.  Gallbladder sludge and mild wall thickening or developing since the previous study. Changes are nonspecific but could  correlate with cholecystitis in the appropriate clinical setting.  Common bile duct:  No bile duct dilatation.  Extrahepatic bile duct diameter is measured at 4 mm.  Liver:  Heterogeneous increased liver echotexture diffusely suggesting fatty infiltration.  No focal masses demonstrated.  IVC:  Segmental visualization of the inferior vena cava appears normal.  Pancreas:  Pancreas is obscured by overlying bowel gas.  Spleen:  Spleen is enlarged, measuring 14.1 x 15.4 x 7.2 cm consistent with volume of 781 ml.  This appears stable.  Right Kidney:  Right kidney measures 13 cm length.  No hydronephrosis.  Left Kidney:  Left kidney measures 13.7 cm length.  No hydronephrosis.  Abdominal aorta:  There is obscured by bowel gas and is not well visualized.  IMPRESSION: Stone in the gallbladder neck with increasing sludge and mild gallbladder wall thickening since previous study.  Changes are nonspecific but may correlate with cholecystitis in the appropriate clinical setting.  Stable splenic enlargement.  Original Report Authenticated By: Marlon Pel, M.D.   US Abdomen Complete  07/26/2011  *RADIOLOGY REPORT*  Clinical Data:  Left upper quadrant pain.  COMPLETE ABDOMINAL ULTRASOUND  Comparison:  CT abdomen and pelvis 07/26/2011 at 1652 hours and abdominal ultrasound 07/03/2011.  Findings:  Gallbladder:  As on the prior studies, a stone is identified in the neck of the gallbladder measuring 1.8 cm.  No pericholecystic fluid or wall thickening.  Sonographer reports negative Murphy's sign.  Common bile duct:  Measures 0.4 cm.  Liver:  The liver appears heterogeneous with some increased echogenicity.  No focal lesion.  IVC:  Appears normal.  Pancreas:  No focal abnormality seen.  Spleen:  No focal lesion.  Measures 12.0 x 15.6 x 7.2 cm for a volume of 703 ccs compared to 910 ccs on the prior exam.  Right Kidney:  Measures 12.0 cm and appears normal.  Left Kidney:  Measures 15.2 cm and appears normal.  Abdominal aorta:   No aneurysm identified.  IMPRESSION:  1.  Single gallstone in the gallbladder neck without evidence of cholecystitis. 2.  Persistent but decreased splenomegaly.  Original Report Authenticated By: Bernadene Bell. Maricela Curet, M.D.   US Abdomen Complete  07/03/2011  *RADIOLOGY REPORT*  Clinical Data:  Elevated liver function tests.  History of hypertension and diabetes.  COMPLETE ABDOMINAL ULTRASOUND 07/03/2011:  Comparison:  None.  Findings:  Gallbladder:  Solitary approximate 1.5 cm shadowing gallstone.  No gallbladder wall thickening or pericholecystic fluid.  Negative sonographic Murphy's sign according to the ultrasound technologist.  Common bile duct:  Normal in caliber with maximum diameter approximating 6 mm.  Liver:  Diffusely increased and coarsened echotexture consistent with steatosis.  Focal sparing in the gallbladder fossa region.  No other focal hepatic parenchymal abnormality.  Patent portal vein with hepatopetal flow.  IVC:  Patent.  Pancreas:  Although the pancreas is difficult to visualize in its entirety, no focal pancreatic abnormality is identified.  Spleen:  Enlarged, measuring approximately 12.5 x 8.0 x 17.5 cm, yielding a volume of approximately 910 ml.  No focal splenic parenchymal abnormality.  Right Kidney:  No hydronephrosis.  Well-preserved cortex.  No shadowing calculi.  Normal size and parenchymal echotexture without focal abnormalities.  Approximately 14.4 cm length.  Left Kidney:  No hydronephrosis.  Well-preserved cortex.  No shadowing calculi.  Normal size and parenchymal echotexture without focal abnormalities.  Approximately 14.5 cm length.  Abdominal aorta:  Normal in caliber throughout its visualized course in the abdomen without significant atherosclerosis.  IMPRESSION:  1.  Solitary approximate 1.5 cm gallstone within the gallbladder. No sonographic evidence of acute cholecystitis. 2.  Diffuse hepatic steatosis with focal sparing surrounding the gallbladder. 3.  Splenomegaly.   Original Report Authenticated By: Arnell Sieving, M.D.   Ct Abdomen Pelvis W Contrast  07/26/2011  *RADIOLOGY REPORT*  Clinical Data: Abdominal pain.  CT ABDOMEN AND PELVIS WITH CONTRAST  Technique:  Multidetector CT imaging of the abdomen and pelvis was performed following the standard protocol during bolus administration of intravenous contrast.  Contrast: OMNIPAQUE IOHEXOL 300 MG/ML IV SOLN  Comparison: Abdominal ultrasound 07/03/2011.  Findings: Mild atelectasis is seen in the left lung base.  Imaged lung parenchyma is otherwise clear.  No pleural or pericardial effusion.  A single stone is seen in the neck of the gallbladder.  No pericholecystic inflammatory change is identified.  The spleen is unremarkable.  The liver is low attenuating consistent with fatty change.  No focal liver lesion.  The biliary tree, adrenal glands, pancreas and kidneys appear normal.  Seminal vesicles, prostate gland urinary bladder are unremarkable.  The stomach, small and large bowel and appendix all appear normal.  The patient has a unilateral pars interarticularis defects on the right at L5.  No anterolisthesis.  No other focal bony abnormality.  IMPRESSION:  1.  No acute finding. 2.  Fatty infiltration of the liver. 3.  Unilateral pars interarticularis defect on the right at L5.  No anterolisthesis. 4.  Single gallstone is identified.  No evidence of cholecystitis.  Original Report Authenticated By: Bernadene Bell. Maricela Curet, M.D.   Dg Chest Port 1 View  08/02/2011  *RADIOLOGY REPORT*  Clinical Data: Chest pain radiating across the chest along the diaphragm with sharp spikes of pain.  Previous history of mononucleosis 06/2011, hypertension, MS.  Recent ERCP.  PORTABLE CHEST - 1 VIEW  Comparison: 07/26/2011  Findings: The heart size and pulmonary vascularity are normal. The lungs appear clear and expanded without focal air space disease or consolidation. No blunting of the costophrenic angles.  No pneumothorax.   Calcified granulomas in the right lung base.  No significant change since previous study.  IMPRESSION: No evidence of active pulmonary disease.  Original Report Authenticated By: Marlon Pel, M.D.   Dg Chest Port 1 View  07/26/2011  *RADIOLOGY REPORT*  Clinical Data: Substernal chest pain  PORTABLE CHEST - 1 VIEW  Comparison: 07/03/2011  Findings: Cardiac leads overlie the chest.  Heart size is normal. Lung volumes are low but clear.  No pleural effusion.  No acute osseous finding.  IMPRESSION: Normal chest.  Original Report Authenticated By: Harrel Lemon, M.D.    Date: 08/02/2011  Rate: 59  Rhythm: sinus bradycardia  QRS Axis: normal  Intervals: normal  ST/T Wave abnormalities: nonspecific ST/T changes  Conduction Disutrbances:none  Narrative Interpretation:   Old EKG Reviewed: unchanged  IV Dilaudid with moderate pain control. No change on exam the patient declines further narcotic pain medicines, despite having moderate pain.   2:34 AM case discussed as above with family practice resident Dr. Berline Chough agrees to admission.  2:34 AM d/w DR Cornett on call for General Surgery who will see PT today with MED admit.     MDM  Right upper quadrant and epigastric pain with equivocal ultrasound of gallbladder. Surgery consult and medicine admit.         Sunnie Nielsen, MD 08/02/11 [phone]

## 2011-08-02 NOTE — Op Note (Signed)
Laparoscopic Cholecystectomy Procedure Note  Indications: This patient presents with symptomatic gallbladder disease and will undergo laparoscopic cholecystectomy.  Pre-operative Diagnosis: Calculus of gallbladder with acute cholecystitis, without mention of obstruction  Post-operative Diagnosis: Same  Surgeon: Abigail Miyamoto A   Assistants: 0  Anesthesia: General endotracheal anesthesia  ASA Class: 2  Procedure Details  The patient was seen again in the Holding Room. The risks, benefits, complications, treatment options, and expected outcomes were discussed with the patient. The possibilities of reaction to medication, pulmonary aspiration, perforation of viscus, bleeding, recurrent infection, finding a normal gallbladder, the need for additional procedures, failure to diagnose a condition, the possible need to convert to an open procedure, and creating a complication requiring transfusion or operation were discussed with the patient. The likelihood of improving the patient's symptoms with return to their baseline status is good.  The patient and/or family concurred with the proposed plan, giving informed consent. The site of surgery properly noted. The patient was taken to Operating Room, identified as Braelynn Lupton and the procedure verified as Laparoscopic Cholecystectomy with Intraoperative Cholangiogram. A Time Out was held and the above information confirmed.  Prior to the induction of general anesthesia, antibiotic prophylaxis was administered. General endotracheal anesthesia was then administered and tolerated well. After the induction, the abdomen was prepped with Chloraprep and draped in sterile fashion. The patient was positioned in the supine position.  Local anesthetic agent was injected into the skin near the umbilicus and an incision made. We dissected down to the abdominal fascia with blunt dissection.  The fascia was incised vertically and we entered the peritoneal cavity  bluntly.  A pursestring suture of 0-Vicryl was placed around the fascial opening.  The Hasson cannula was inserted and secured with the stay suture.  Pneumoperitoneum was then created with CO2 and tolerated well without any adverse changes in the patient's vital signs. An 11-mm port was placed in the subxiphoid position.  Two 5-mm ports were placed in the right upper quadrant. All skin incisions were infiltrated with a local anesthetic agent before making the incision and placing the trocars.   We positioned the patient in reverse Trendelenburg, tilted slightly to the patient's left.  The gallbladder was identified, the fundus grasped and retracted cephalad. Adhesions were lysed bluntly and with the electrocautery where indicated, taking care not to injure any adjacent organs or viscus. The infundibulum was grasped and retracted laterally, exposing the peritoneum overlying the triangle of Calot. This was then divided and exposed in a blunt fashion. The cystic duct was clearly identified and bluntly dissected circumferentially. A critical view of the cystic duct and cystic artery was obtained.  The cystic duct was then ligated with clips and divided. The cystic artery was, dissected free, ligated with clips and divided as well.   The gallbladder was dissected from the liver bed in retrograde fashion with the electrocautery. The gallbladder was removed and placed in an Endocatch sac. The liver bed was irrigated and inspected. Hemostasis was achieved with the electrocautery. Copious irrigation was utilized and was repeatedly aspirated until clear.  The gallbladder and Endocatch sac were then removed through the umbilical port site.  The pursestring suture was used to close the umbilical fascia.    We again inspected the right upper quadrant for hemostasis.  Pneumoperitoneum was released as we removed the trocars.  4-0 Monocryl was used to close the skin.   Benzoin, steri-strips, and clean dressings were applied.  The patient was then extubated and brought to the recovery room  in stable condition. Instrument, sponge, and needle counts were correct at closure and at the conclusion of the case.   Findings: Cholecystitis with Cholelithiasis  Estimated Blood Loss: Minimal         Drains: 0         Specimens: Gallbladder           Complications: None; patient tolerated the procedure well.         Disposition: PACU - hemodynamically stable.         Condition: stable

## 2011-08-02 NOTE — Consult Note (Signed)
Reason for Consult:Abdomina pain Referring Physician: [name] is an 41 y.o. male.  HPI: The patient presents with 5 months history of abdominal pain/  Pain location is epigastrium and right and left upper quadrants.  Here 1 week ago for cardiac and GI workup and sent home.  Ultrasound done last night shows gallstones and mild gallbladder wall thickening.  Pain cam back lat night after eating some cheeseburgers.  Sharp pain with radiation to his back.  Asked to see at request of Dr Brandon [name].  Past Medical History  Diagnosis Date  . Hypertension   . MS (multiple sclerosis)   . Pneumonia 2010  . Hyperinsulinemia 07/04/11    "I'm on metformin cause I produce too much insulin"  . Headache   . TIA (transient ischemic attack) 2000    denies residual  . Mononucleosis, infectious, with hepatitis 06/2011  . Depression 2005  . Anxiety 2005  . Sleep apnea     uses CPAP at home  . Stroke 2000  . Diabetes mellitus 2012  . TIA (transient ischemic attack)     Past Surgical History  Procedure Date  . Tonsillectomy and adenoidectomy ~ 1980  . Myringotomy 1979; 1980    bilaterally  . Colonoscopy 2003  . Esophagogastroduodenoscopy 2003  . Esophagogastroduodenoscopy 07/28/2011    Procedure: ESOPHAGOGASTRODUODENOSCOPY (EGD);  Surgeon: [name], MD;  Location[facility] ENDOSCOPY;  Service: Endoscopy;  Laterality: N/A;    Family History  Problem Relation Age of Onset  . Coronary artery disease Father   . Stroke Father   . Diabetes Mother     Social History:  reports that he quit smoking about 24 years ago. His smoking use included Cigarettes. He has a 9 pack-year smoking history. He has never used smokeless tobacco. He reports that he uses illicit drugs (Marijuana). He reports that he does not drink alcohol.  Allergies:  Allergies  Allergen Reactions  . Sulfa Antibiotics Other (See Comments)    It will kill him  . Cephalosporins Other (See Comments)    Reaction unspecified.  [name]  Penicillins Other (See Comments)    fever  . Quinolones Other (See Comments)    Reaction unspecified    Medications:  Prior to Admission:  Prescriptions prior to admission  Medication Sig Dispense Refill  . aspirin EC 81 MG tablet Take 81 mg by mouth daily.        . calcium carbonate (TUMS - DOSED IN MG ELEMENTAL CALCIUM) 500 MG chewable tablet Chew 1 tablet by mouth daily as needed. For upset stomach      . lisinopril-hydrochlorothiazide (PRINZIDE,ZESTORETIC) 10-12.5 MG per tablet Take 1 tablet by mouth daily.        . metFORMIN (GLUCOPHAGE) 500 MG tablet Take 1,000 mg by mouth 2 (two) times daily with a meal.        . naproxen sodium (ANAPROX) 220 MG tablet Take 440 mg by mouth daily as needed. For pain      . oxyCODONE-acetaminophen (ROXICET) 5-325 MG per tablet Take 1 tablet by mouth every 6 (six) hours as needed for pain.  30 tablet  0  . sennosides-docusate sodium (SENOKOT-S) 8.6-50 MG tablet Take 1 tablet by mouth daily.  30 tablet  1  . Simethicone (GAS-X [name]) Take 1 tablet by mouth daily as needed. For gas relieve       Scheduled:   . heparin  5,000 Units Subcutaneous Q8H  . HYDROmorphone      .  HYDROmorphone (DILAUDID) injection  1 mg Intravenous Once  . ondansetron  4 mg Intravenous Once   Continuous:   . sodium chloride     JXB:JYNWGNFAOZH, HYDROmorphone, ondansetron (ZOFRAN) IV, ondansetron  Results for orders placed during the hospital encounter of 08/01/11 (from the past 48 hour(s))  CBC     Status: Abnormal   Collection Time   08/01/11 11:51 PM      Component Value Range Comment   WBC 6.9  4.0 - 10.5 (K/uL)    RBC 5.18  4.22 - 5.81 (MIL/uL)    Hemoglobin 14.3  13.0 - 17.0 (g/dL)    HCT 08.6  57.8 - 46.9 (%)    MCV 79.3  78.0 - 100.0 (fL)    MCH 27.6  26.0 - 34.0 (pg)    MCHC 34.8  30.0 - 36.0 (g/dL)    RDW 62.9  52.8 - 41.3 (%)    Platelets 145 (*) 150 - 400 (K/uL)   DIFFERENTIAL     Status: Normal   Collection Time   08/01/11 11:51 PM      Component  Value Range Comment   Neutrophils Relative 53  43 - 77 (%)    Neutro Abs 3.6  1.7 - 7.7 (K/uL)    Lymphocytes Relative 35  12 - 46 (%)    Lymphs Abs 2.4  0.7 - 4.0 (K/uL)    Monocytes Relative 9  3 - 12 (%)    Monocytes Absolute 0.6  0.1 - 1.0 (K/uL)    Eosinophils Relative 3  0 - 5 (%)    Eosinophils Absolute 0.2  0.0 - 0.7 (K/uL)    Basophils Relative 0  0 - 1 (%)    Basophils Absolute 0.0  0.0 - 0.1 (K/uL)   COMPREHENSIVE METABOLIC PANEL     Status: Normal   Collection Time   08/01/11 11:51 PM      Component Value Range Comment   Sodium 140  135 - 145 (mEq/L)    Potassium 3.7  3.5 - 5.1 (mEq/L)    Chloride 100  96 - 112 (mEq/L)    CO2 30  19 - 32 (mEq/L)    Glucose, Bld 90  70 - 99 (mg/dL)    BUN 13  6 - 23 (mg/dL)    Creatinine, Ser 2.44  0.50 - 1.35 (mg/dL)    Calcium 01.0  8.4 - 10.5 (mg/dL)    Total Protein 7.3  6.0 - 8.3 (g/dL)    Albumin 4.0  3.5 - 5.2 (g/dL)    AST 21  0 - 37 (U/L)    ALT 52  0 - 53 (U/L)    Alkaline Phosphatase 67  39 - 117 (U/L)    Total Bilirubin 0.3  0.3 - 1.2 (mg/dL)    GFR calc non Af Amer >90  >90 (mL/min)    GFR calc Af Amer >90  >90 (mL/min)   CK TOTAL AND CKMB     Status: Normal   Collection Time   08/01/11 11:51 PM      Component Value Range Comment   Total CK 118  7 - 232 (U/L)    CK, MB 1.9  0.3 - 4.0 (ng/mL)    Relative Index 1.6  0.0 - 2.5    POCT I-STAT TROPONIN I     Status: Normal   Collection Time   08/02/11 12:07 AM      Component Value Range Comment   Troponin i, poc 0.01  0.00 - 0.08 (ng/mL)  Comment 3            CARDIAC PANEL(CRET KIN+CKTOT+MB+TROPI)     Status: Normal   Collection Time   08/02/11  3:50 AM      Component Value Range Comment   Total CK 101  7 - 232 (U/L)    CK, MB 1.8  0.3 - 4.0 (ng/mL)    Troponin I <0.30  <0.30 (ng/mL)    Relative Index 1.8  0.0 - 2.5      US Abdomen Complete  08/02/2011  *RADIOLOGY REPORT*  Clinical Data:  Chest pain and generalized abdominal pain.  COMPLETE ABDOMINAL ULTRASOUND   Comparison:  07/26/2011  Findings:  Gallbladder:  Stone in the gallbladder neck with sludge layering in the gallbladder.  Mild gallbladder wall thickening.  Murphy's sign is negative. The appearance of a gallstone is stable.  Gallbladder sludge and mild wall thickening or developing since the previous study. Changes are nonspecific but could correlate with cholecystitis in the appropriate clinical setting.  Common bile duct:  No bile duct dilatation.  Extrahepatic bile duct diameter is measured at 4 mm.  Liver:  Heterogeneous increased liver echotexture diffusely suggesting fatty infiltration.  No focal masses demonstrated.  IVC:  Segmental visualization of the inferior vena cava appears normal.  Pancreas:  Pancreas is obscured by overlying bowel gas.  Spleen:  Spleen is enlarged, measuring 14.1 x 15.4 x 7.2 cm consistent with volume of 781 ml.  This appears stable.  Right Kidney:  Right kidney measures 13 cm length.  No hydronephrosis.  Left Kidney:  Left kidney measures 13.7 cm length.  No hydronephrosis.  Abdominal aorta:  There is obscured by bowel gas and is not well visualized.  IMPRESSION: Stone in the gallbladder neck with increasing sludge and mild gallbladder wall thickening since previous study.  Changes are nonspecific but may correlate with cholecystitis in the appropriate clinical setting.  Stable splenic enlargement.  Original Report Authenticated By: Marlon Pel, M.D.   Dg Chest Port 1 View  08/02/2011  *RADIOLOGY REPORT*  Clinical Data: Chest pain radiating across the chest along the diaphragm with sharp spikes of pain.  Previous history of mononucleosis 06/2011, hypertension, MS.  Recent ERCP.  PORTABLE CHEST - 1 VIEW  Comparison: 07/26/2011  Findings: The heart size and pulmonary vascularity are normal. The lungs appear clear and expanded without focal air space disease or consolidation. No blunting of the costophrenic angles.  No pneumothorax.  Calcified granulomas in the right lung  base.  No significant change since previous study.  IMPRESSION: No evidence of active pulmonary disease.  Original Report Authenticated By: Marlon Pel, M.D.    Review of Systems  Constitutional: Negative for fever and weight loss.  HENT: Negative.   Eyes: Negative.   Respiratory: Negative for cough and shortness of breath.   Cardiovascular: Positive for chest pain. Negative for orthopnea and claudication.  Gastrointestinal: Positive for nausea, vomiting and abdominal pain. Negative for diarrhea.  Genitourinary: Negative.   Musculoskeletal: Negative.   Skin: Negative.   Neurological: Positive for focal weakness.  Endo/Heme/Allergies: Negative.   Psychiatric/Behavioral: Negative.    Blood pressure 145/87, pulse 59, temperature 98.2 F (36.8 C), temperature source Oral, resp. rate 18, height 5\' 8"  (1.727 m), weight 259 lb 14.8 oz (117.9 kg), SpO2 100.00%. Physical Exam  Constitutional: He is oriented to person, place, and time. He appears well-developed and well-nourished.  HENT:  Head: Normocephalic and atraumatic.  Eyes: EOM are normal. Pupils are equal, round, and reactive  to light.  Neck: Normal range of motion. Neck supple.  Cardiovascular: Normal rate and regular rhythm.   Respiratory: Effort normal.  GI: Soft. There is tenderness in the right upper quadrant, epigastric area and left upper quadrant. There is rebound, guarding and positive Murphy's sign. There is no CVA tenderness.  Neurological: He is alert and oriented to person, place, and time. GCS eye subscore is 4. GCS verbal subscore is 5. GCS motor subscore is 6.  Skin: Skin is warm, dry and intact.    Assessment/Plan: Acute on chronic cholecystitis.  Recommend laparoscopic cholecystectomy with cholangiogram. MS HTN BMI greater than 35 The procedure has been discussed with the patient. Operative and non operative treatments have been discussed. Risks of surgery include bleeding, infection,  Common bile duct  injury,  Injury to the stomach,liver, colon,small intestine, abdominal wall,  Diaphragm,  Major blood vessels,  And the need for an open procedure.  Other risks include worsening of medical problems, death,  DVT and pulmonary embolism, and cardiovascular events.   Medical options have also been discussed.  The need for an open procedure discussed.  The patient has been informed of long term expectations of surgery and non surgical options,  The patient agrees to proceed.    Jozalyn Baglio A. 08/02/2011, 5:54 AM

## 2011-08-02 NOTE — Progress Notes (Signed)
Patient ID: Brandon [name], male   DOB: [birth year], 41 y.o.   MRN: [id number]   Patient with gallstones and acute on chronic cholecystitis  Plan Lap Chole today  Risks discussed with patient in detail and he wishes to proceed.  Likelihood of success is good

## 2011-08-02 NOTE — Progress Notes (Signed)
Placed patient on CPAP at 11cm per home settings. Sp02=97% on room air

## 2011-08-02 NOTE — Transfer of Care (Signed)
Immediate Anesthesia Transfer of Care Note  Patient: Brandon [name]  Procedure(s) Performed:  LAPAROSCOPIC CHOLECYSTECTOMY  Patient Location: PACU  Anesthesia Type: General  Level of Consciousness: awake, alert , oriented and sedated  Airway & Oxygen Therapy: Patient Spontanous Breathing and Patient connected to nasal cannula oxygen  Post-op Assessment: Report given to PACU RN, Post -op Vital signs reviewed and stable and Patient moving all extremities  Post vital signs: Reviewed and stable  Complications: No apparent anesthesia complications

## 2011-08-02 NOTE — Anesthesia Postprocedure Evaluation (Signed)
  Anesthesia Post-op Note  Patient: Brandon [name]  Procedure(s) Performed:  LAPAROSCOPIC CHOLECYSTECTOMY  Patient Location: PACU  Anesthesia Type: General  Level of Consciousness: awake, alert , sedated and patient cooperative  Airway and Oxygen Therapy: Patient Spontanous Breathing and Patient connected to nasal cannula oxygen  Post-op Pain: mild  Post-op Assessment: Post-op Vital signs reviewed, Patient's Cardiovascular Status Stable, Respiratory Function Stable, Patent Airway, No signs of Nausea or vomiting and Pain level controlled  Post-op Vital Signs: stable  Complications: No apparent anesthesia complications

## 2011-08-02 NOTE — ED Notes (Signed)
Patient reports abdominal pain in 3 different areas with back pain, chest pain. States was seen here a week ago for the same. States pain is a constant 5/10 but has waves of more intense pain at 7/10. Denies N/V

## 2011-08-02 NOTE — H&P (Signed)
FMTS Attending Admit Note  Patient seen and examined by me, just arrived on 5100 floor following cholecystectomy.  Is reporting some postoperative nausea.  Reviewed his history as documented by resident team.   A/P: Patient with acute-on-chronic cholecystitis, status-post CCY this morning.  Postoperative management.  Patient with diagnosis of DM listed, and Hgb A1C <6%. May hold metformin perioperatively, resume once resumes adequate oral intake.  Paula Compton, MD

## 2011-08-02 NOTE — H&P (Signed)
Family Medicine Teaching Service HISTORY & PHYSICAL   Patient name: Brandon [name] Medical record number: [id number] Date of birth: 09-21-1970 Age: 41 y.o. Gender: male  Primary Care Provider: [name] Ng, PA    Chief Complaint: RUQ Abdominal Pain    HPI  Brandon [name] is a 41 y.o. year old male presenting with acute worsening of abdominal pain following a recent hospitalization 1 week ago.  He reports that tonight he has a new RUQ pain that was worsened after dinner of weight watchers mini cheese burgers.  He stated the pain was RUQ in nature and radiated in a circle.  He also describes a R mid back pain that is worse approximately over the tip of the scapula that began at the same time as his RUQ pain.  He dose still have LUQ and midsternal pain that is relatively unchanged since last admission; CP described as mid substernal that radiates laterally in both directions along the diaphragm  Upon evaluation in the [facility] ED ultrasound revealed a stone in the gallbladder neck with increasing sludge and progressive gallbladder thickening since last admission.  Due to this finding and severe pain requiring Dilaudid he was felt appropriate for admission and will be evaluated by surgery later this morning.     ROS   Infectious No fevers, chills or sick contacts  Resp No cough, no congestion  Cardiac CP as above  GI No nausea, no vomiting, constipation during last hospitalization but resolved; no diarrhea  GU No reported changes in urinary habits  Neuro Chronic changes due to MS per his report including muscle spasms on occasion as well as memory losses  MSK Reports the substernal pain feels MSK in nature as does the back pain  Trauma None reported  Activity Decreased from baseline  Diet Has had decreased po intake compared to baseline             HISTORY:  PMHx:  Past Medical History  Diagnosis Date  . Hypertension   . MS (multiple sclerosis)   . Pneumonia 2010  . Hyperinsulinemia  07/04/11    "I'm on metformin cause I produce too much insulin"  . Headache   . TIA (transient ischemic attack) 2000    denies residual  . Mononucleosis, infectious, with hepatitis 06/2011  . Depression 2005  . Anxiety 2005  . Sleep apnea     uses CPAP at home  . Stroke 2000  . Diabetes mellitus 2012  . TIA (transient ischemic attack)     PSHx: Past Surgical History  Procedure Date  . Tonsillectomy and adenoidectomy ~ 1980  . Myringotomy 1979; 1980    bilaterally  . Colonoscopy 2003  . Esophagogastroduodenoscopy 2003  . Esophagogastroduodenoscopy 07/28/2011    Procedure: ESOPHAGOGASTRODUODENOSCOPY (EGD);  Surgeon: [name], MD;  Location: [facility] ENDOSCOPY;  Service: Endoscopy;  Laterality: N/A;    Social Hx: History   Social History  . Marital Status: Single    Spouse Name: N/A    Number of Children: N/A  . Years of Education: N/A   Social History Main Topics  . Smoking status: Former Smoker -- 3.0 packs/day for 3 years    Types: Cigarettes    Quit date: 07/07/1987  . Smokeless tobacco: Never Used  . Alcohol Use: No     07/04/11 "glass of wine or spirits once a month"  . Drug Use: Yes    Special: Marijuana     Rare use of marijuana; "once a year tops"  . Sexually Active: Yes  Other Topics Concern  . None   Social History Narrative   Lives in [place].    Family Hx: Family History  Problem Relation Age of Onset  . Coronary artery disease Father   . Stroke Father   . Diabetes Mother     Allergies: Allergies  Allergen Reactions  . Sulfa Antibiotics Other (See Comments)    It will kill him  . Cephalosporins Other (See Comments)    Reaction unspecified.  [name] Penicillins Other (See Comments)    fever  . Quinolones Other (See Comments)    Reaction unspecified    Home Medications: Prior to Admission medications   Medication Sig Start Date End Date Taking? Authorizing Provider  aspirin EC 81 MG tablet Take 81 mg by mouth daily.     Yes Historical  Provider, MD  calcium carbonate (TUMS - DOSED IN MG ELEMENTAL CALCIUM) 500 MG chewable tablet Chew 1 tablet by mouth daily as needed. For upset stomach   Yes Historical Provider, MD  lisinopril-hydrochlorothiazide (PRINZIDE,ZESTORETIC) 10-12.5 MG per tablet Take 1 tablet by mouth daily.     Yes Historical Provider, MD  metFORMIN (GLUCOPHAGE) 500 MG tablet Take 1,000 mg by mouth 2 (two) times daily with a meal.     Yes Historical Provider, MD  naproxen sodium (ANAPROX) 220 MG tablet Take 440 mg by mouth daily as needed. For pain   Yes Historical Provider, MD  oxyCODONE-acetaminophen (ROXICET) 5-325 MG per tablet Take 1 tablet by mouth every 6 (six) hours as needed for pain. 07/28/11 08/07/11 Yes Edd Arbour, MD  sennosides-docusate sodium (SENOKOT-S) 8.6-50 MG tablet Take 1 tablet by mouth daily. 07/28/11 07/27/12 Yes Edd Arbour, MD  Simethicone (GAS-X PO) Take 1 tablet by mouth daily as needed. For gas relieve 07/22/11  Yes Historical Provider, MD              OBJECTIVE  Vitals: Patient Vitals for the past 24 hrs:  BP Temp Temp src Pulse Resp SpO2  08/01/11 2320 152/86 mmHg 98.7 F (37.1 C) Oral 63  22  100 %   Wt Readings from Last 3 Encounters:  07/28/11 260 lb (117.935 kg)  07/28/11 260 lb (117.935 kg)  07/22/11 266 lb (120.657 kg)   No intake or output data in the 24 hours ending 08/02/11 0338  PE: GENERAL:  Adult male examined in [facility] ED.  In mild discomfort but no respiratory distress.  Somewhat discheveled HNEENT: AT/, MMM, no scleral icterus, EOMi THORAX: HEART: RRR, S1/S2 heard, no murmur LUNGS: CTA B, no wheezes, no crackles ABDOMEN:  +BS, soft, non-rigid,  Diffusely tender but exquisite pain with palpation over RUQ.  + Murphy's sign.  Neg Mcburney's, neg rebound EXTREMITIES: Moves all 4 extremities spontaneously, warm well perfused, no edema, bilateral DP and PT pulses 1+/4.    >MSK/Osteopathic: TTP over thoracic paraspinals worse over tip of R  scapula  LABS: Hematology:  Lab 08/01/11 2351 07/28/11 0530 07/27/11 0229  WBC 6.9 7.4 8.4  HGB 14.3 13.9 14.3  HCT 41.1 40.6 41.4  PLT 145* 124* 126*  RDW 13.7 14.4 14.3  MCV 79.3 80.2 80.2  MCHC 34.8 34.2 34.5    Lab 08/01/11 2351 07/26/11 1331  LYMPHSABS 2.4 1.9  EOSABS 0.2 0.0  BASOSABS 0.0 0.0  Metabolic:  Lab 08/01/11 2351 07/28/11 0530 07/27/11 0229  NA 140 137 139  K 3.7 3.7 3.3*  CL 100 98 103  CO2 30 29 28   BUN 13 8 11   CREATININE 0.94 1.00 0.79  GLUCOSE 90 99 91  CALCIUM 10.0 9.4 9.4  MG -- -- 1.8  PHOS -- -- 4.0    Lab 08/01/11 2351 07/27/11 0229 07/26/11 1331  AST 21 21 30   ALKPHOS 67 67 82  BILITOT 0.3 0.6 0.4  BILIDIR -- -- --  PROT 7.3 7.0 8.3  ALBUMIN 4.0 3.7 4.5  PREALBUMIN -- -- --    Lab 07/28/11 1256 07/28/11 1244 07/28/11 0757 07/27/11 2353 07/27/11 2036 07/27/11 1628 07/27/11 1123 07/27/11 0737 07/27/11 0427 07/26/11 2338  GLUCAP 83 96 96 86 81 103* 103* 97 90 90    Lab 07/26/11 2343  HGBA1C 5.5   Other Labs:  Lab 08/01/11 2351 07/27/11 0828 07/27/11 0229 07/26/11 2043  CKTOTAL 118 104 112 --  TROPONINI -- <0.30 <0.30 <0.30  TROPONINT -- -- -- --  CKMBINDEX -- -- -- --    Lab 07/26/11 1331  AMYLASE --  LIPASE 37    MICRO: None  IMAGING: 08/02/2011 Abdominal US:  Stone in the gallbladder neck with increasing sludge and mild gallbladder wall thickening since previous study. Changes are nonspecific but may correlate with cholecystitis in the appropriate clinical setting. Stable splenic enlargement. 08/02/2011 CXR: No evidence of active pulmonary disease.            ASSESSMENT & PLAN  41 y.o. year old male with new onset RUQ pain in setting of underlying idiopathic abdominal pain syndrome.  1. GI (Abdominal Pain, Gall bladder thickening):  Recently discharge with suspicion for IBS however currently worsening [place] with concerns for cholecystitis although a febrile and no leukocytosis.  Surgery to evaluate him later this  morning.  Will continue with pain control and consider HIDA scan without CCK to evaluate gall bladder futher but will defer to surgery.  We will make him NPO at this time. Will obtain UDS for ? Etiology of pains and potential toxic cause. *Dilaudid *Zofran [ ]  UDS  2. CV (Chest Pain, HTN): although recent workup due to acute change will cycle cardiac enzymes and continue on telemetry.  Will trend BP and restart home BP meds when taking po.  Will provide IV Hyralazine prn.   [ ]  CE *Hydralazine  3. DM:  Will place on SSI while here and hold metformin until d/c home. - A1C 5.5 on 07/26/11 *SSI  --- FEN/GI: NPO until evaluated by surgery --- IVFs: NS @  125 --- PPx: Heparin            DISPOSITION  Will admit this pt for further workup and evaluation by surgery.  Dispo to home at d/c  Gaspar Bidding, DO [facility] Resident - PGY-1 08/02/2011 3:38 AM    FPTS R3 Note  I have seen and examined patient and agree with above.  Briefly, this is a 41 yo Male with recent hospital admission for abdominal pain, concern for PUD, and found to have negative endoscopy.  Since leaving hospital he was in usual state of health until this evening when he experienced acute onset of RUQ pain after eating several cheeseburgers for dinner.  Also began experiencing back pain at that time.  Describes as sharp stabbing pain.  Also endorses some mid-sternal chest pain that occurred at roughly the same time.  Pain worsens on deep inspiration.  Denies any fevers, chills, nausea, vomiting.  Pain last admission deemed likely secondary to IBS.  PE Gen: Obese male sitting in hospital bed, no acute distress HEENT:  [place]/AT.  EOMI, PERRL.  MMM, tonsils non-erythematous, non-edematous.  External ears WNL, Bilateral TM's normal without retraction, redness or bulging.  Neck:  No JVD or LAD Cardiac:  Regular rate and rhythm without murmur auscultated.  Good S1/S2. Pulm:  Clear to auscultation bilaterally with  good air movement.  No wheezes or rales noted.   Abd:  Soft/obese.  TTP RUQ and epigastrum area.  Positive Murphy's sign.  No guarding or rebound.  Good BS throughout. Ext:  No clubbing/cyanosis/erythema.  No edema noted bilateral lower extremities.   Neuro:  Alert and oriented to person, place, and date.  CN II-XII intact.  No focal deficits noted.   Psych:  Not depressed, somewhat anxious appearing.   A/P:  42 yo M presenting with RUQ pain and concern for cholelithiasis:    1. RUQ pain:  Classic symptoms, arising after greasy meal, stabbing in quality, radiating to back and Right shoulder.  Ultrasound did show stone in gallbladder neck.  WBC normal, no fevers or chills, much less likely cholecystitis.  Plan to admit patient to inpatient bed and consult surgery in AM for further recommendations.  Dilaudid for pain control.  2. Chest pain:  Began roughly same time as RUQ pain.  Reproducible on exam.  Much less likely to be cardiac in origin.  However, patient does have multiple comorbidities including diagnosis of diabetes, therefore plan to rule out from ACS standpoint.  Obtain EKG now and cardiac enzymes. 3. Back pain:  Likely MSK in nature, however could also be radiating pain from gallbladder.  Dilaudid for pain control as above, restart Flexeril as home med once he is taking PO again.  4. MS:  Patient currently followed by Neurology without treatment at this time.  Defer to outpatient management. 5. HTN:  Hydralazine if needed.  Restart PO home meds when taking po again. 6. DM2:  A1C 5.5 last visit.  He is on Metformin at home.  Holding for now, SSI if needed.  7. Disposition:  pending surgical recommendations and clinical improvement.

## 2011-08-03 ENCOUNTER — Encounter (HOSPITAL_COMMUNITY): Payer: Self-pay | Admitting: Surgery

## 2011-08-03 LAB — BASIC METABOLIC PANEL
BUN: 7 mg/dL (ref 6–23)
CO2: 27 mEq/L (ref 19–32)
Calcium: 8.8 mg/dL (ref 8.4–10.5)
Chloride: 103 mEq/L (ref 96–112)
Creatinine, Ser: 0.92 mg/dL (ref 0.50–1.35)
GFR calc Af Amer: >90 mL/min (ref >90)
GFR calc non Af Amer: >90 mL/min (ref >90)
Glucose, Bld: 90 mg/dL (ref 70–99)
Potassium: 3.8 mEq/L (ref 3.5–5.1)
Sodium: 139 mEq/L (ref 135–145)

## 2011-08-03 LAB — CBC
HCT: 36.9 % — ABNORMAL LOW (ref 39.0–52.0)
Hemoglobin: 12.6 g/dL — ABNORMAL LOW (ref 13.0–17.0)
MCH: 27.4 pg (ref 26.0–34.0)
MCHC: 34.1 g/dL (ref 30.0–36.0)
MCV: 80.2 fL (ref 78.0–100.0)
Platelets: 104 10*3/uL — ABNORMAL LOW (ref 150–400)
RBC: 4.6 MIL/uL (ref 4.22–5.81)
RDW: 14.1 % (ref 11.5–15.5)
WBC: 6.4 10*3/uL (ref 4.0–10.5)

## 2011-08-03 MED ORDER — OXYCODONE-ACETAMINOPHEN 5-325 MG PO TABS: 1.0000 | ORAL_TABLET | ORAL | Status: AC | PRN

## 2011-08-03 NOTE — Progress Notes (Signed)
Patient ID: Brandon [name], male   DOB: 1970/08/15, 41 y.o.   MRN: [id number] 1 Day Post-Op  Subjective: Pt c/o some soreness today.  Tolerating regular diet.  Pain well controlled with po pain meds.  Objective: Vital signs in last 24 hours: Temp:  [97.5 F (36.4 C)-98.8 F (37.1 C)] 97.5 F (36.4 C) (01/28 0532) Pulse Rate:  [48-85] 75  (01/28 0532) Resp:  [16-20] 18  (01/28 0532) BP: (112-158)/(78-91) 149/83 mmHg (01/28 0532) SpO2:  [94 %-100 %] 98 % (01/28 0532) Last BM Date: 08/01/11  Intake/Output from previous day: 01/27 0701 - 01/28 0700 In: 3089 [I.V.:3089] Out: 3900 [Urine:3900] Intake/Output this shift:    PE: Abd: soft, appropriately tender, +BS, obese, incisions c/d/i  Lab Results:   Basename 08/03/11 0645 08/02/11 1020  WBC 6.4 5.8  HGB 12.6* 13.3  HCT 36.9* 39.0  PLT 104* 132*   BMET  Basename 08/03/11 0645 08/02/11 1020 08/01/11 2351  NA 139 -- 140  K 3.8 -- 3.7  CL 103 -- 100  CO2 27 -- 30  GLUCOSE 90 -- 90  BUN 7 -- 13  CREATININE 0.92 0.91 --  CALCIUM 8.8 -- 10.0   PT/INR No results found for this basename: LABPROT:2,INR:2 in the last 72 hours   Studies/Results: US Abdomen Complete  08/02/2011  *RADIOLOGY REPORT*  Clinical Data:  Chest pain and generalized abdominal pain.  COMPLETE ABDOMINAL ULTRASOUND  Comparison:  07/26/2011  Findings:  Gallbladder:  Stone in the gallbladder neck with sludge layering in the gallbladder.  Mild gallbladder wall thickening.  Murphy's sign is negative. The appearance of a gallstone is stable.  Gallbladder sludge and mild wall thickening or developing since the previous study. Changes are nonspecific but could correlate with cholecystitis in the appropriate clinical setting.  Common bile duct:  No bile duct dilatation.  Extrahepatic bile duct diameter is measured at 4 mm.  Liver:  Heterogeneous increased liver echotexture diffusely suggesting fatty infiltration.  No focal masses demonstrated.  IVC:  Segmental  visualization of the inferior vena cava appears normal.  Pancreas:  Pancreas is obscured by overlying bowel gas.  Spleen:  Spleen is enlarged, measuring 14.1 x 15.4 x 7.2 cm consistent with volume of 781 ml.  This appears stable.  Right Kidney:  Right kidney measures 13 cm length.  No hydronephrosis.  Left Kidney:  Left kidney measures 13.7 cm length.  No hydronephrosis.  Abdominal aorta:  There is obscured by bowel gas and is not well visualized.  IMPRESSION: Stone in the gallbladder neck with increasing sludge and mild gallbladder wall thickening since previous study.  Changes are nonspecific but may correlate with cholecystitis in the appropriate clinical setting.  Stable splenic enlargement.  Original Report Authenticated By: Marlon Pel, M.D.   Dg Chest Port 1 View  08/02/2011  *RADIOLOGY REPORT*  Clinical Data: Chest pain radiating across the chest along the diaphragm with sharp spikes of pain.  Previous history of mononucleosis 06/2011, hypertension, MS.  Recent ERCP.  PORTABLE CHEST - 1 VIEW  Comparison: 07/26/2011  Findings: The heart size and pulmonary vascularity are normal. The lungs appear clear and expanded without focal air space disease or consolidation. No blunting of the costophrenic angles.  No pneumothorax.  Calcified granulomas in the right lung base.  No significant change since previous study.  IMPRESSION: No evidence of active pulmonary disease.  Original Report Authenticated By: Marlon Pel, M.D.    Anti-infectives: Anti-infectives     Start     Dose/Rate  Route Frequency Ordered Stop   08/02/11 0630   vancomycin (VANCOCIN) IVPB 1000 mg/200 mL premix  Status:  Discontinued        1,000 mg 200 mL/hr over 60 Minutes Intravenous 120 min pre-op 08/02/11 0623 08/02/11 1002           Assessment/Plan  1. S/p lap chole  Plan: 1. Ok for d/c today from our standpoint.  Will have him follow up in 2-3 weeks in our clinic.   LOS: 2 days    [name]  E 08/03/2011

## 2011-08-03 NOTE — Progress Notes (Signed)
Patient interviewed and examined. He is doing very well following his cholecystectomy. I agree that it is appropriate that he be discharged home today. He will followup with Dr. Magnus Ivan in the office.   Angelia Mould. Derrell Lolling, M.D., [facility] General and Minimally invasive Surgery Breast and Colorectal Surgery Office:   [phone] Pager:   [phone]

## 2011-08-03 NOTE — Progress Notes (Signed)
Pt given dc instructions. Rn went over medications and incision/wound care. Pt verbalized understanding and had no questions regarding care of instructions.  

## 2011-08-03 NOTE — Discharge Summary (Signed)
Physician Discharge Summary  Patient ID: Brandon [name] MRN: [id number] DOB/AGE: 11-Jan-1971 41 y.o.  Admit date: 08/01/2011 Discharge date: 08/03/2011  Admission Diagnoses: Abdominal pain  Discharge Diagnoses:  Principal Problem:  *Cholelithiasis with acute cholecystitis   Discharged Condition: stable   Hospital Course: 41 yo M presenting with RUQ pain and concern for cholelithiasis: In brief, his hospital problems are as follows: 1. RUQ pain: Classic symptoms, arising after greasy meal, stabbing in quality, radiating to back and Right shoulder. Ultrasound did show stone in gallbladder neck. WBC normal, no fevers or chills. Surgery consulted and felt patient was appropriate for cholecystectomy. Patient tolerated procedure well. OOB and tolerating solid foods on Post-op day #1. Discharged home in stable medical condition with minimal pain. Given Percocet #30 for pain. He will follow-up with surgery as well as his PCP. Encouraged to avoid greasy foods.  2. Chest pain: Began roughly same time as RUQ pain. (This pain was present on previous admission and cardiac etiology ruled out then.) Reproducible on exam. Much less likely to be cardiac in origin on this admission as well. However, patient does have multiple comorbidities including diagnosis of diabetes, therefore plan to rule out from ACS standpoint. EKG and CE negative. Hopefully this pain has fully resolved after having gallbladder removed. Will follow up with PCP. 3. MS: Patient currently followed by Neurology without treatment at this time. Defer to outpatient management. 4. HTN: Stable throughout admission. Restarted on home medications 5. DM2: A1C 5.5 last visit. He is on Metformin at home which was held while inpatient. Restarted at discharge.  Consults: general surgery  Significant Diagnostic Studies:  CBC    Component Value Date/Time   WBC 6.4 08/03/2011 0645   RBC 4.60 08/03/2011 0645   HGB 12.6* 08/03/2011 0645   HCT 36.9*  08/03/2011 0645   PLT 104* 08/03/2011 0645   MCV 80.2 08/03/2011 0645   MCH 27.4 08/03/2011 0645   MCHC 34.1 08/03/2011 0645   RDW 14.1 08/03/2011 0645   LYMPHSABS 2.4 08/01/2011 2351   MONOABS 0.6 08/01/2011 2351   EOSABS 0.2 08/01/2011 2351   BASOSABS 0.0 08/01/2011 2351   BMET    Component Value Date/Time   NA 139 08/03/2011 0645   K 3.8 08/03/2011 0645   CL 103 08/03/2011 0645   CO2 27 08/03/2011 0645   GLUCOSE 90 08/03/2011 0645   BUN 7 08/03/2011 0645   CREATININE 0.92 08/03/2011 0645   CALCIUM 8.8 08/03/2011 0645   GFRNONAA >90 08/03/2011 0645   GFRAA >90 08/03/2011 0645   US Abdomen Complete  08/02/2011  *RADIOLOGY REPORT*  Clinical Data:  Chest pain and generalized abdominal pain.  COMPLETE ABDOMINAL ULTRASOUND  Comparison:  07/26/2011  Findings:  Gallbladder:  Stone in the gallbladder neck with sludge layering in the gallbladder.  Mild gallbladder wall thickening.  Murphy's sign is negative. The appearance of a gallstone is stable.  Gallbladder sludge and mild wall thickening or developing since the previous study. Changes are nonspecific but could correlate with cholecystitis in the appropriate clinical setting.  Common bile duct:  No bile duct dilatation.  Extrahepatic bile duct diameter is measured at 4 mm.  Liver:  Heterogeneous increased liver echotexture diffusely suggesting fatty infiltration.  No focal masses demonstrated.  IVC:  Segmental visualization of the inferior vena cava appears normal.  Pancreas:  Pancreas is obscured by overlying bowel gas.  Spleen:  Spleen is enlarged, measuring 14.1 x 15.4 x 7.2 cm consistent with volume of 781 ml.  This appears stable.  Right Kidney:  Right kidney measures 13 cm length.  No hydronephrosis.  Left Kidney:  Left kidney measures 13.7 cm length.  No hydronephrosis.  Abdominal aorta:  There is obscured by bowel gas and is not well visualized.  IMPRESSION: Stone in the gallbladder neck with increasing sludge and mild gallbladder wall thickening since  previous study.  Changes are nonspecific but may correlate with cholecystitis in the appropriate clinical setting.  Stable splenic enlargement.  Original Report Authenticated By: Marlon Pel, M.D.   Dg Chest Port 1 View  08/02/2011  *RADIOLOGY REPORT*  Clinical Data: Chest pain radiating across the chest along the diaphragm with sharp spikes of pain.  Previous history of mononucleosis 06/2011, hypertension, MS.  Recent ERCP.  PORTABLE CHEST - 1 VIEW  Comparison: 07/26/2011  Findings: The heart size and pulmonary vascularity are normal. The lungs appear clear and expanded without focal air space disease or consolidation. No blunting of the costophrenic angles.  No pneumothorax.  Calcified granulomas in the right lung base.  No significant change since previous study.  IMPRESSION: No evidence of active pulmonary disease.  Original Report Authenticated By: Marlon Pel, M.D.    Treatments: surgery: cholecystectomy (08/01/11)  Discharge Exam: Blood pressure 149/83, pulse 75, temperature 97.5 F (36.4 C), temperature source Oral, resp. rate 18, height 5\' 8"  (1.727 m), weight 259 lb 14.8 oz (117.9 kg), SpO2 98.00%. General appearance: alert, cooperative and mild distress Resp: clear to auscultation bilaterally Cardio: regular rate and rhythm GI: normal findings: bowel sounds normal and abnormal findings:  mild distention (likely from lap surgery), 4 bandages in place from surgery are C/D/I Extremities: extremities normal, atraumatic, no cyanosis or edema Neurologic: Grossly normal  Recommendations for Follow-up: - Please follow-up pain of abdomen - Patient to follow-up with surgery for post-op visit - Patient had chest pain during, no cardiac etiology found. Please follow-up on this pain - Please discuss dietary changes with patient (avoid high fat, greasy pain.)  Disposition: Home or Self Care   Medication List  As of 08/03/2011  8:31 AM   ASK your doctor about these medications          aspirin EC 81 MG tablet   Take 81 mg by mouth daily.      calcium carbonate 500 MG chewable tablet   Commonly known as: TUMS - dosed in mg elemental calcium   Chew 1 tablet by mouth daily as needed. For upset stomach      GAS-X PO   Take 1 tablet by mouth daily as needed. For gas relieve      lisinopril-hydrochlorothiazide 10-12.5 MG per tablet   Commonly known as: PRINZIDE,ZESTORETIC   Take 1 tablet by mouth daily.      metFORMIN 500 MG tablet   Commonly known as: GLUCOPHAGE   Take 1,000 mg by mouth 2 (two) times daily with a meal.      naproxen sodium 220 MG tablet   Commonly known as: ANAPROX   Take 440 mg by mouth daily as needed. For pain      oxyCODONE-acetaminophen 5-325 MG per tablet   Commonly known as: PERCOCET   Take 1 tablet by mouth every 6 (six) hours as needed for pain.      sennosides-docusate sodium 8.6-50 MG tablet   Commonly known as: SENOKOT-S   Take 1 tablet by mouth daily.           Follow-up Information    Follow up with [facility], PA-C .  SignedMikel Cella, Layani Foronda 08/03/2011, 8:31 AM

## 2011-08-03 NOTE — Progress Notes (Signed)
Patient ID: Brandon [name], male   DOB: Jan 27, 1971, 41 y.o.   MRN: [id number]  Subjective: The patient alert. Tolerating liquid diet. Angled toward. Voiding without difficulty. Umbilicus.  Objective: Vital signs stable. Abdomen obese. Soft. Minimal tenderness at umbilicus. Benign exam.  Lab work this morning looks fine.  Assessment: Status post laparoscopic cholecystectomy. Doing well Multiple sclerosis Hypertension Diabetes mellitus.  Plan: Advance to solid diet. If he tolerates this well he can go home. Followup with Dr. Magnus Ivan in 3 weeks Measurement of medical problems by the family medicine teaching service.   Angelia Mould. Derrell Lolling, M.D., [facility] General and Minimally invasive Surgery Breast and Colorectal Surgery Office:   [phone] Pager:   [phone]

## 2011-08-03 NOTE — Discharge Summary (Signed)
I discussed with Dr Hairford.  I agree with their plans documented in their  Note for today.  

## 2011-08-07 ENCOUNTER — Ambulatory Visit: Payer: Self-pay | Admitting: Physician Assistant

## 2011-08-17 ENCOUNTER — Ambulatory Visit (INDEPENDENT_AMBULATORY_CARE_PROVIDER_SITE_OTHER): Payer: Managed Care, Other (non HMO) | Admitting: Physician Assistant

## 2011-08-17 DIAGNOSIS — I10 Essential (primary) hypertension: Secondary | ICD-10-CM

## 2011-08-17 DIAGNOSIS — E781 Pure hyperglyceridemia: Secondary | ICD-10-CM

## 2011-08-17 DIAGNOSIS — E161 Other hypoglycemia: Secondary | ICD-10-CM

## 2011-08-17 DIAGNOSIS — E669 Obesity, unspecified: Secondary | ICD-10-CM

## 2011-08-17 LAB — COMPREHENSIVE METABOLIC PANEL
ALT: 49 U/L (ref 0–53)
AST: 20 U/L (ref 0–37)
Albumin: 4.6 g/dL (ref 3.5–5.2)
Alkaline Phosphatase: 71 U/L (ref 39–117)
BUN: 18 mg/dL (ref 6–23)
CO2: 26 mEq/L (ref 19–32)
Calcium: 10 mg/dL (ref 8.4–10.5)
Chloride: 102 mEq/L (ref 96–112)
Creat: 0.91 mg/dL (ref 0.50–1.35)
Glucose, Bld: 89 mg/dL (ref 70–99)
Potassium: 4 mEq/L (ref 3.5–5.3)
Sodium: 140 mEq/L (ref 135–145)
Total Bilirubin: 0.5 mg/dL (ref 0.3–1.2)
Total Protein: 7.5 g/dL (ref 6.0–8.3)

## 2011-08-17 LAB — POCT CBC
Granulocyte percent: 55 %G (ref 37–80)
HCT, POC: 46.2 % (ref 43.5–53.7)
Hemoglobin: 15.3 g/dL (ref 14.1–18.1)
Lymph, poc: 2.3 (ref 0.6–3.4)
MCH, POC: 26.9 pg — AB (ref 27–31.2)
MCHC: 33.1 g/dL (ref 31.8–35.4)
MCV: 81.4 fL (ref 80–97)
MID (cbc): 0.5 (ref 0–0.9)
MPV: 9 fL (ref 0–99.8)
POC Granulocyte: 3.4 (ref 2–6.9)
POC LYMPH PERCENT: 37.4 %L (ref 10–50)
POC MID %: 7.5 %M (ref 0–12)
Platelet Count, POC: 204 10*3/uL (ref 142–424)
RBC: 5.68 M/uL (ref 4.69–6.13)
RDW, POC: 14.7 %
WBC: 6.1 10*3/uL (ref 4.6–10.2)

## 2011-08-17 LAB — LIPID PANEL
Cholesterol: 138 mg/dL (ref 0–200)
HDL: 31 mg/dL — ABNORMAL LOW (ref >39)
LDL Cholesterol: 61 mg/dL (ref 0–99)
Total CHOL/HDL Ratio: 4.5 Ratio
Triglycerides: 231 mg/dL — ABNORMAL HIGH (ref <150)
VLDL: 46 mg/dL — ABNORMAL HIGH (ref 0–40)

## 2011-08-17 LAB — POCT GLYCOSYLATED HEMOGLOBIN (HGB A1C): Hemoglobin A1C: 4.9

## 2011-08-17 LAB — TSH: TSH: 1.57 u[IU]/mL (ref 0.350–4.500)

## 2011-08-17 MED ORDER — LISINOPRIL-HYDROCHLOROTHIAZIDE 10-12.5 MG PO TABS: 1.0000 | ORAL_TABLET | Freq: Every day | ORAL | Status: DC

## 2011-08-17 MED ORDER — METFORMIN HCL 1000 MG PO TABS: ORAL_TABLET | ORAL | Status: DC

## 2011-08-17 NOTE — Progress Notes (Signed)
  Subjective:    Patient ID: Brandon [name], male    DOB: September 05, 1970, 40 y.o.   MRN: [id number]  HPI  Recent Mono (08/2010) and multiple hospitalizations, gallbadder removed 3 weeks ago. Laparascopically removed and successful without complication.  Reviewed multiple hospital notes.  Patient had cardiac work-up x 2 that were negative.  Doing great since surgery. Fatigue, but no other problems.  Non-compliant with meds over the last 6-7 weeks.  Sees surgeon tomorrow for f/up    Review of Systems  All other systems reviewed and are negative.       Objective:   Physical Exam  Nursing note and vitals reviewed. Constitutional: He appears well-developed and well-nourished.       Great weight loss!  Neck: Normal range of motion. Neck supple.  Cardiovascular: Normal rate, regular rhythm, normal heart sounds and intact distal pulses.  Exam reveals no gallop and no friction rub.   No murmur heard. Psychiatric: He has a normal mood and affect.       Pressured speech.  Seems a little more manic than I've seen him, but he states it is because I usu see him earlier in the day before he has fully awakened.          Assessment & Plan:  htn-stable.  Take meds!! Hyperinsulinemia/obesity-check labs, continue metformin. Handicap registration filled out due to MS-like neurological d/o.

## 2011-08-17 NOTE — Patient Instructions (Signed)
Continue with decreased dietary intake, decreasing sugars.  Exercise as tolerated.

## 2011-08-18 ENCOUNTER — Encounter (INDEPENDENT_AMBULATORY_CARE_PROVIDER_SITE_OTHER): Payer: Self-pay

## 2011-08-18 ENCOUNTER — Ambulatory Visit (INDEPENDENT_AMBULATORY_CARE_PROVIDER_SITE_OTHER): Payer: Managed Care, Other (non HMO) | Admitting: General Surgery

## 2011-08-18 VITALS — BP 124/76 | HR 68 | Temp 97.9°F | Resp 18 | Ht 68.0 in | Wt 249.4 lb

## 2011-08-18 DIAGNOSIS — Z9889 Other specified postprocedural states: Secondary | ICD-10-CM

## 2011-08-18 DIAGNOSIS — K8000 Calculus of gallbladder with acute cholecystitis without obstruction: Secondary | ICD-10-CM

## 2011-08-18 LAB — INSULIN, FASTING: Insulin fasting, serum: 43 u[IU]/mL — ABNORMAL HIGH (ref 3–28)

## 2011-08-18 NOTE — Patient Instructions (Signed)
Follow up as needed

## 2011-08-18 NOTE — Progress Notes (Signed)
Brandon [name] 1971/07/06 [id number] 08/18/2011   Brandon [name] is a 41 y.o. male who had a laparoscopic cholecystectomy with intraoperative cholangiogram.  The pathology report confirmed acute cholecystitis with cholelithiasis by Dr. Magnus Ivan.  The patient reports that they are feeling well with some loose bowel movements and good appetite, but mildly decreased from normal.  The pre-operative symptoms of abdominal pain, nausea, and vomiting have resolved.    Physical examination - Incisions appear well-healed with no sign of infection or bleeding.   Abdomen - soft, non-tender  Impression:  s/p laparoscopic cholecystectomy  Plan:  He may resume a regular diet and full activity.  He may follow-up on a PRN basis.

## 2011-08-27 ENCOUNTER — Telehealth: Payer: Self-pay

## 2011-08-27 NOTE — Telephone Encounter (Signed)
Called pt back who had LM on my VM, and explained lab results and instructions from [facility].

## 2012-07-06 DIAGNOSIS — G0400 Acute disseminated encephalitis and encephalomyelitis, unspecified: Secondary | ICD-10-CM

## 2012-07-06 HISTORY — DX: Acute disseminated encephalitis and encephalomyelitis, unspecified: G04.00

## 2012-07-13 ENCOUNTER — Ambulatory Visit (INDEPENDENT_AMBULATORY_CARE_PROVIDER_SITE_OTHER): Payer: Managed Care, Other (non HMO) | Admitting: Physician Assistant

## 2012-07-13 ENCOUNTER — Encounter: Payer: Self-pay | Admitting: Physician Assistant

## 2012-07-13 VITALS — BP 139/75 | HR 72 | Temp 97.9°F | Resp 17 | Ht 68.0 in | Wt 280.0 lb

## 2012-07-13 DIAGNOSIS — F419 Anxiety disorder, unspecified: Secondary | ICD-10-CM

## 2012-07-13 DIAGNOSIS — E669 Obesity, unspecified: Secondary | ICD-10-CM

## 2012-07-13 DIAGNOSIS — Z0000 Encounter for general adult medical examination without abnormal findings: Secondary | ICD-10-CM

## 2012-07-13 DIAGNOSIS — F411 Generalized anxiety disorder: Secondary | ICD-10-CM

## 2012-07-13 DIAGNOSIS — Z125 Encounter for screening for malignant neoplasm of prostate: Secondary | ICD-10-CM

## 2012-07-13 DIAGNOSIS — R7989 Other specified abnormal findings of blood chemistry: Secondary | ICD-10-CM

## 2012-07-13 DIAGNOSIS — R69 Illness, unspecified: Secondary | ICD-10-CM

## 2012-07-13 DIAGNOSIS — Z23 Encounter for immunization: Secondary | ICD-10-CM

## 2012-07-13 LAB — POCT URINALYSIS DIPSTICK
Bilirubin, UA: NEGATIVE
Blood, UA: NEGATIVE
Glucose, UA: NEGATIVE
Ketones, UA: NEGATIVE
Leukocytes, UA: NEGATIVE
Nitrite, UA: NEGATIVE
Protein, UA: NEGATIVE
Spec Grav, UA: 1.025
Urobilinogen, UA: 0.2
pH, UA: 5.5

## 2012-07-13 LAB — CBC WITH DIFFERENTIAL/PLATELET
Basophils Absolute: 0 10*3/uL (ref 0.0–0.1)
Basophils Relative: 1 % (ref 0–1)
Eosinophils Absolute: 0.1 10*3/uL (ref 0.0–0.7)
Eosinophils Relative: 2 % (ref 0–5)
HCT: 49.9 % (ref 39.0–52.0)
Hemoglobin: 17.5 g/dL — ABNORMAL HIGH (ref 13.0–17.0)
Lymphocytes Relative: 30 % (ref 12–46)
Lymphs Abs: 1.8 10*3/uL (ref 0.7–4.0)
MCH: 27.7 pg (ref 26.0–34.0)
MCHC: 35.1 g/dL (ref 30.0–36.0)
MCV: 79.1 fL (ref 78.0–100.0)
Monocytes Absolute: 0.6 10*3/uL (ref 0.1–1.0)
Monocytes Relative: 10 % (ref 3–12)
Neutro Abs: 3.4 10*3/uL (ref 1.7–7.7)
Neutrophils Relative %: 57 % (ref 43–77)
Platelets: 170 10*3/uL (ref 150–400)
RBC: 6.31 MIL/uL — ABNORMAL HIGH (ref 4.22–5.81)
RDW: 15.1 % (ref 11.5–15.5)
WBC: 5.9 10*3/uL (ref 4.0–10.5)

## 2012-07-13 LAB — LIPID PANEL
Cholesterol: 174 mg/dL (ref 0–200)
HDL: 31 mg/dL — ABNORMAL LOW (ref >39)
Total CHOL/HDL Ratio: 5.6 Ratio
Triglycerides: 667 mg/dL — ABNORMAL HIGH (ref <150)

## 2012-07-13 LAB — COMPREHENSIVE METABOLIC PANEL
ALT: 49 U/L (ref 0–53)
AST: 23 U/L (ref 0–37)
Albumin: 4.7 g/dL (ref 3.5–5.2)
Alkaline Phosphatase: 53 U/L (ref 39–117)
BUN: 17 mg/dL (ref 6–23)
CO2: 26 mEq/L (ref 19–32)
Calcium: 10 mg/dL (ref 8.4–10.5)
Chloride: 105 mEq/L (ref 96–112)
Creat: 0.82 mg/dL (ref 0.50–1.35)
Glucose, Bld: 88 mg/dL (ref 70–99)
Potassium: 3.8 mEq/L (ref 3.5–5.3)
Sodium: 143 mEq/L (ref 135–145)
Total Bilirubin: 0.4 mg/dL (ref 0.3–1.2)
Total Protein: 6.7 g/dL (ref 6.0–8.3)

## 2012-07-13 LAB — PSA: PSA: 0.57 ng/mL (ref <=4.00)

## 2012-07-13 LAB — TSH: TSH: 2.52 u[IU]/mL (ref 0.350–4.500)

## 2012-07-13 LAB — POCT GLYCOSYLATED HEMOGLOBIN (HGB A1C): Hemoglobin A1C: 5.1

## 2012-07-13 MED ORDER — GABAPENTIN 300 MG PO CAPS: ORAL_CAPSULE | ORAL | Status: DC

## 2012-07-13 MED ORDER — METFORMIN HCL 1000 MG PO TABS: ORAL_TABLET | ORAL | Status: DC

## 2012-07-13 MED ORDER — LISINOPRIL-HYDROCHLOROTHIAZIDE 10-12.5 MG PO TABS: 1.0000 | ORAL_TABLET | Freq: Every day | ORAL | Status: DC

## 2012-07-13 NOTE — Progress Notes (Signed)
Subjective:    Patient ID: Brandon [name], male    DOB: 05/19/1971, 42 y.o.   MRN: [id number]  HPI Brandon [name] is a 42 yr old male who had scheduled an appt for a physical today. He has multiple things that need to be addressed today; so I deferred his CPE due to this.  He is on disability now.  He still has not seen his neurologist since the spring of 2013.  He has been now diagnosed as having MS, but his condition is not classic MSHe tells me he has tried scheduling an appointment , but they are always booked and don't schedule past 2 months out. He says he is experiencing an inordinate amount of fatigue.  He continues to eat too much sugar.  He is having a lot of pain in his feet. He is unsure if this is related to his neurological d/o.  He also has been feeling somewhat depressed and anxious.  We have discussed this before, but he has never admitted being depressed to me before today.  He also discusses how food was always like a reward system when he was growing up, so he does acknowledge that he tends to be an emotional eater. He is receptive to counseling at this time.  He denies SI/HI.  His live-in partner, Darl Pikes is very supportive.  She is always with him when I see him in the office. He also wants me to check his Left ear due to decreased hearing.  Takes his metformin once daily (he is supposed to be taking it bid). He says he is compliant with his other meds.  Review of Systems  All other systems reviewed and are negative.       Objective:   Physical Exam  Nursing note and vitals reviewed. Constitutional: He is oriented to person, place, and time. He appears well-developed and well-nourished.       L TM blocked with cerumen  HENT:  Head: Normocephalic and atraumatic.       L TM obstructed with cerumen.  R TM WNL  Cardiovascular: Normal rate and regular rhythm.   Pulmonary/Chest: Effort normal.  Musculoskeletal:       B feet without any obvious deformity and no ulcerations.  He does  have dry skin.  Neurological: He is alert and oriented to person, place, and time.  Skin: Skin is warm and dry.  Psychiatric: His behavior is normal.       Some pressured speech today     Results for orders placed in visit on 07/13/12  POCT URINALYSIS DIPSTICK      Component Value Range   Color, UA yellow     Clarity, UA clear     Glucose, UA neg     Bilirubin, UA neg     Ketones, UA neg     Spec Grav, UA 1.025     Blood, UA neg     pH, UA 5.5     Protein, UA neg     Urobilinogen, UA 0.2     Nitrite, UA neg     Leukocytes, UA Negative    TSH      Component Value Range   TSH 2.520  0.350 - 4.500 uIU/mL  COMPREHENSIVE METABOLIC PANEL      Component Value Range   Sodium 143  135 - 145 mEq/L   Potassium 3.8  3.5 - 5.3 mEq/L   Chloride 105  96 - 112 mEq/L   CO2 26  19 - 32  mEq/L   Glucose, Bld 88  70 - 99 mg/dL   BUN 17  6 - 23 mg/dL   Creat 4.69  6.29 - 5.28 mg/dL   Total Bilirubin 0.4  0.3 - 1.2 mg/dL   Alkaline Phosphatase 53  39 - 117 U/L   AST 23  0 - 37 U/L   ALT 49  0 - 53 U/L   Total Protein 6.7  6.0 - 8.3 g/dL   Albumin 4.7  3.5 - 5.2 g/dL   Calcium 41.3  8.4 - 24.4 mg/dL  LIPID PANEL      Component Value Range   Cholesterol 174  0 - 200 mg/dL   Triglycerides 010 (*) <150 mg/dL   HDL 31 (*) >27 mg/dL   Total CHOL/HDL Ratio 5.6     VLDL NOT CALC  0 - 40 mg/dL   LDL Cholesterol      CBC WITH DIFFERENTIAL      Component Value Range   WBC 5.9  4.0 - 10.5 K/uL   RBC 6.31 (*) 4.22 - 5.81 MIL/uL   Hemoglobin 17.5 (*) 13.0 - 17.0 g/dL   HCT 25.3  66.4 - 40.3 %   MCV 79.1  78.0 - 100.0 fL   MCH 27.7  26.0 - 34.0 pg   MCHC 35.1  30.0 - 36.0 g/dL   RDW 47.4  25.9 - 56.3 %   Platelets 170  150 - 400 K/uL   Neutrophils Relative 57  43 - 77 %   Neutro Abs 3.4  1.7 - 7.7 K/uL   Lymphocytes Relative 30  12 - 46 %   Lymphs Abs 1.8  0.7 - 4.0 K/uL   Monocytes Relative 10  3 - 12 %   Monocytes Absolute 0.6  0.1 - 1.0 K/uL   Eosinophils Relative 2  0 - 5 %    Eosinophils Absolute 0.1  0.0 - 0.7 K/uL   Basophils Relative 1  0 - 1 %   Basophils Absolute 0.0  0.0 - 0.1 K/uL   Smear Review Criteria for review not met    PSA      Component Value Range   PSA 0.57  <=4.00 ng/mL  POCT GLYCOSYLATED HEMOGLOBIN (HGB A1C)      Component Value Range   Hemoglobin A1C 5.1          Assessment & Plan:  I did not do a CPE today because we had so many other issues to address.  Darl Pikes (his partner) is going to try and help him navigate getting a neurology appointment.  For his foot pain and anxiety,  I will have him start neurontin and see if he gets a therapeutic response.  I am unsure how much of his fatigue, depression, and other problems are related to overall poor diet, metabolic syndrome, insulin resistance, non-compliance in addition to his neurological disorder.  It is also difficult to tell how much insight he has into how his choices affect his health.  We have had the same conversation regarding these type things many times over the last few years. His memory is affected by his MS as well.  I will have him see a counselor.  He will see me back in 3-4 weeks.  I want him to work on medication compliance-discussed alarm setting and other strategies so he wont forget his evening doses.  I also want him to continue working on cutting out sugar. Hypertension-suboptimal control but should improve with the changes weve discussed. Hypertriglyceridemia-should  also improve with the changes weve discussed.  Insulin resisitance-continue metformin. Spent 1 hour face to face with patient and Darl Pikes.

## 2012-08-31 ENCOUNTER — Ambulatory Visit (INDEPENDENT_AMBULATORY_CARE_PROVIDER_SITE_OTHER): Payer: Managed Care, Other (non HMO) | Admitting: Physician Assistant

## 2012-08-31 ENCOUNTER — Encounter: Payer: Self-pay | Admitting: Physician Assistant

## 2012-08-31 VITALS — BP 116/82 | HR 71 | Temp 98.2°F | Resp 16 | Ht 68.0 in | Wt 273.8 lb

## 2012-08-31 DIAGNOSIS — M25579 Pain in unspecified ankle and joints of unspecified foot: Secondary | ICD-10-CM

## 2012-08-31 DIAGNOSIS — H6122 Impacted cerumen, left ear: Secondary | ICD-10-CM

## 2012-08-31 DIAGNOSIS — H6120 Impacted cerumen, unspecified ear: Secondary | ICD-10-CM

## 2012-08-31 DIAGNOSIS — F411 Generalized anxiety disorder: Secondary | ICD-10-CM

## 2012-08-31 DIAGNOSIS — E669 Obesity, unspecified: Secondary | ICD-10-CM

## 2012-08-31 MED ORDER — METFORMIN HCL 1000 MG PO TABS: ORAL_TABLET | ORAL | Status: DC

## 2012-08-31 MED ORDER — LISINOPRIL-HYDROCHLOROTHIAZIDE 10-12.5 MG PO TABS: 1.0000 | ORAL_TABLET | Freq: Every day | ORAL | Status: DC

## 2012-08-31 MED ORDER — GABAPENTIN 300 MG PO CAPS: ORAL_CAPSULE | ORAL | Status: DC

## 2012-08-31 NOTE — Progress Notes (Signed)
  Subjective:    Patient ID: Brandon [name], male    DOB: 08-Jan-1971, 42 y.o.   MRN: [id number]  HPI [name] is here today for a 1 month check up since seeing his neurologist as well as having his CPAP adjusted.  Dr. Vickey Huger added Ronne Binning which has decreased his appetite significantly.  Dr. Marjory Lies wanted him to be evaluated at [facility] by Dr. Pearlie Oyster.  At this point Dr. Pearlie Oyster is in agreement that [name] has some form of MS, but he is unsure of the subtype.  He is following a much stricter diet.  No refined sugars. Foot pain is better on the gabapentin.  People around him have noticed he is less anxious.  Review of Systems not done today     Objective:   Physical Exam  Nursing note and vitals reviewed. Constitutional: He is oriented to person, place, and time. He appears well-developed and well-nourished.  HENT:  L TM is blocked with cerumen.  Place wax softener in ear, then irrigated to loosen wax.  Instrumentation by cerumen scoop to remove wax with success.  TM intact and WNL.  Cardiovascular: Normal rate.   Pulmonary/Chest: Effort normal.  Neurological: He is alert and oriented to person, place, and time.  Skin: Skin is warm and dry.      Assessment & Plan:  Recheck on foot pain and anxiety-both improved on gabapentin.  Likely Ronne Binning is helping with this as well. Continue to work on [employer].  Continue to see neurologist. We will recheck labs in 3 months.  Hopefully his triglycerides will be improved by then. Cerumen impaction resolved with irrigation and instrumentation. Continue treatment plan.

## 2012-09-08 ENCOUNTER — Ambulatory Visit (INDEPENDENT_AMBULATORY_CARE_PROVIDER_SITE_OTHER): Payer: Managed Care, Other (non HMO) | Admitting: Psychology

## 2012-09-08 DIAGNOSIS — F411 Generalized anxiety disorder: Secondary | ICD-10-CM

## 2012-09-22 ENCOUNTER — Ambulatory Visit (INDEPENDENT_AMBULATORY_CARE_PROVIDER_SITE_OTHER): Payer: Managed Care, Other (non HMO) | Admitting: Psychology

## 2012-09-22 DIAGNOSIS — F411 Generalized anxiety disorder: Secondary | ICD-10-CM

## 2012-10-08 ENCOUNTER — Other Ambulatory Visit: Payer: Self-pay | Admitting: Physician Assistant

## 2012-10-10 ENCOUNTER — Telehealth: Payer: Self-pay

## 2012-10-10 NOTE — Telephone Encounter (Signed)
This was done on 10/08/12

## 2012-10-10 NOTE — Telephone Encounter (Signed)
Pharmacy faxed over a request for a refill on Gabapentin 300mg  #180.

## 2012-10-13 ENCOUNTER — Ambulatory Visit (INDEPENDENT_AMBULATORY_CARE_PROVIDER_SITE_OTHER): Payer: Managed Care, Other (non HMO) | Admitting: Psychology

## 2012-10-13 DIAGNOSIS — F411 Generalized anxiety disorder: Secondary | ICD-10-CM

## 2012-10-19 DIAGNOSIS — G959 Disease of spinal cord, unspecified: Secondary | ICD-10-CM | POA: Insufficient documentation

## 2012-10-19 DIAGNOSIS — R269 Unspecified abnormalities of gait and mobility: Secondary | ICD-10-CM | POA: Insufficient documentation

## 2012-10-26 ENCOUNTER — Other Ambulatory Visit: Payer: Self-pay | Admitting: Physician Assistant

## 2012-10-27 ENCOUNTER — Ambulatory Visit: Payer: Managed Care, Other (non HMO) | Admitting: Psychology

## 2012-11-11 ENCOUNTER — Ambulatory Visit: Payer: Managed Care, Other (non HMO) | Admitting: Family Medicine

## 2012-11-11 ENCOUNTER — Ambulatory Visit: Payer: Managed Care, Other (non HMO)

## 2012-11-11 VITALS — BP 132/80 | HR 63 | Temp 98.7°F | Resp 18 | Ht 68.0 in | Wt 273.0 lb

## 2012-11-11 DIAGNOSIS — M79672 Pain in left foot: Secondary | ICD-10-CM

## 2012-11-11 DIAGNOSIS — M79609 Pain in unspecified limb: Secondary | ICD-10-CM

## 2012-11-11 NOTE — Patient Instructions (Addendum)
Please let me know if your foot does not feel better in the next week or so.  Use the boot to protect your foot.  Use the walker as needed for support.

## 2012-11-11 NOTE — Progress Notes (Signed)
[facility] [address] [place] [address] [phone]  Date:  11/11/2012   Name:  Brandon [name]   DOB:  05-13-1971   MRN:  [id number]  PCP:  [name] Ng    Chief Complaint: Foot Injury and Extremity Weakness   History of Present Illness:  Brandon [name] is a 42 y.o. very pleasant male patient who presents with the following:  He has poor sensation of his feet.  He noted pain in his left foot last night- he is not aware of any injury but knows he could have injured himself without knowing it.  He has been wearing good supportive running shoes recently.   He has iced and elevated the foot.  He still has a lot of pain over the 5th MT.    He has right leg weakness but this is stable and thought to be part of his MS.  There is no acute change in this symptom.   He does use a cane at baseline.  He is walking about 3 miles a day which is an increase for him.  He is trying to get into better shape. He has not noted any heat or swelling of the foot.    Patient Active Problem List   Diagnosis Date Noted  . Cholelithiasis with acute cholecystitis 08/02/2011  . Abdominal pain, LUQ 07/27/2011  . Cholelithiasis 07/27/2011  . Esophageal reflux 07/27/2011  . Obesity 07/22/2011  . Attention deficit hyperactivity disorder (ADHD) 07/22/2011  . Multiple sclerosis 07/03/2011  . Nausea & vomiting 07/03/2011  . Splenomegaly 07/03/2011  . Elevated LFTs 07/03/2011    Past Medical History  Diagnosis Date  . Hypertension   . MS (multiple sclerosis)   . Pneumonia 2010  . Hyperinsulinemia 07/04/11    "I'm on metformin cause I produce too much insulin"  . Headache   . TIA (transient ischemic attack) 2000    denies residual  . Anxiety 2005  . Stroke 2000  . Diabetes mellitus 2012  . TIA (transient ischemic attack)   . Sleep apnea     uses CPAP at home  . Depression 2005  . Mononucleosis, infectious, with hepatitis 06/2011  . Hearing loss   . Nausea    occasional per history form 08/18/11  . Allergy   . Hyperlipidemia   . Neuromuscular disorder     Past Surgical History  Procedure Laterality Date  . Tonsillectomy and adenoidectomy  ~ 1980  . Myringotomy  1979; 1980    bilaterally  . Colonoscopy  2003  . Esophagogastroduodenoscopy  2003  . Esophagogastroduodenoscopy  07/28/2011    Procedure: ESOPHAGOGASTRODUODENOSCOPY (EGD);  Surgeon: [name], MD;  Location: [facility] ENDOSCOPY;  Service: Endoscopy;  Laterality: N/A;  . Cholecystectomy  08/02/2011    Procedure: LAPAROSCOPIC CHOLECYSTECTOMY;  Surgeon: Shelly Rubenstein, MD;  Location: [facility] OR;  Service: General;;    History  Substance Use Topics  . Smoking status: Former Smoker -- 3.00 packs/day for 3 years    Types: Cigarettes    Quit date: 08/18/1987  . Smokeless tobacco: Never Used  . Alcohol Use: No     Comment: 07/04/11 "glass of wine or spirits once a month"    Family History  Problem Relation Age of Onset  . Coronary artery disease Father   . Stroke Father   . Diabetes Mother   . Cancer Maternal Grandmother     bone  . Cancer Other     Stomach -Great-great grandmother  Allergies  Allergen Reactions  . Sulfa Antibiotics Other (See Comments)    It will kill him  . Cephalosporins Other (See Comments)    Reaction unspecified.  [name] Penicillins Other (See Comments)    fever  . Quinolones Other (See Comments)    Reaction unspecified    Medication list has been reviewed and updated.  Current Outpatient Prescriptions on File Prior to Visit  Medication Sig Dispense Refill  . cyclobenzaprine (FLEXERIL) 10 MG tablet Take 10 mg by mouth 3 (three) times daily as needed for muscle spasms.      [name] gabapentin (NEURONTIN) 300 MG capsule 1 tab 3X daily X1 week, then 2 tabs tid  180 capsule  1  . lisinopril-hydrochlorothiazide (PRINZIDE,ZESTORETIC) 10-12.5 MG per tablet Take 1 tablet by mouth daily.  90 tablet  1  . metFORMIN (GLUCOPHAGE) 1000 MG tablet 1 tab twice daily  180  tablet  2  . PRESCRIPTION MEDICATION Nuvigil 250 mg 1 tab po qd       No current facility-administered medications on file prior to visit.    Review of Systems:  As per HPI- otherwise negative.   Physical Examination: Filed Vitals:   11/11/12 1527  BP: 132/80  Pulse: 63  Temp: 98.7 F (37.1 C)  Resp: 18   Filed Vitals:   11/11/12 1527  Height: 5\' 8"  (1.727 m)  Weight: 273 lb (123.832 kg)   Body mass index is 41.52 kg/(m^2). Ideal Body Weight: Weight in (lb) to have BMI = 25: 164.1  GEN: WDWN, NAD, Non-toxic, A & O x 3, obese HEENT: Atraumatic, Normocephalic. Neck supple. No masses, No LAD. Ears and Nose: No external deformity. CV: RRR, No M/G/R. No JVD. No thrill. No extra heart sounds. PULM: CTA B, no wheezes, crackles, rhonchi. No retractions. No resp. distress. No accessory muscle use. EXTR: No c/c/e NEURO uses cane, favoring left foot.   PSYCH: Normally interactive. Conversant. Not depressed or anxious appearing.  Calm demeanor.  Left foot: he has slight tenderness over the lateral dorsal foot.  He is tender over the 5th MT on the sole as well.  There is no heat, redness or other sign of cellulitis, no break in the skin.  No bruise.  Ankle is negative.     [facility] reading (PRIMARY) by  Dr. Patsy Lager Left foot:  Negative  LEFT FOOT - COMPLETE 3+ VIEW  Comparison: None.  Findings: Variant ossicle noted at the base of the proximal phalanx of the left great toe. No fracture or dislocation. No soft tissue abnormality. No radiopaque foreign body.  IMPRESSION: No acute osseous abnormality of the left foot.   Assessment and Plan: Pain in left foot - Plan: DG Foot Complete Left  Pain in left foot in a patient with DM and MS, both of which contribute to decreased sensation in the feet.  He has increased his walking regimen as of late.  Likely diagnosis is foot sprain, but it is possible that he has a stress fracture which is not apparent.    Placed in CAM boot and gave  rx for a walker.  If his symptoms are not resolved in the next week or so he will call and we will look further- ? Bone scan to rule out an occult fracture.  Sooner if worse.      Signed Abbe Amsterdam, MD

## 2012-11-14 ENCOUNTER — Telehealth: Payer: Self-pay | Admitting: Radiology

## 2012-11-14 NOTE — Telephone Encounter (Signed)
Message copied by Caffie Damme on Mon Nov 14, 2012 10:58 AM ------      Message from: Abbe Amsterdam C      Created: Fri Nov 11, 2012  8:53 PM       Please give him a call (ok to [facility]) and let him know his x-ray was over- read as negative by the radiologist.  (we also read it as negative for any fracture in his foot).  Thanks! ------

## 2012-11-14 NOTE — Telephone Encounter (Signed)
Called him to advise.  He states he is improving. Walking better. Not having to use the boot advised if pain returns to go back to the boot.

## 2012-11-17 ENCOUNTER — Ambulatory Visit (INDEPENDENT_AMBULATORY_CARE_PROVIDER_SITE_OTHER): Payer: Managed Care, Other (non HMO) | Admitting: Psychology

## 2012-11-17 DIAGNOSIS — F411 Generalized anxiety disorder: Secondary | ICD-10-CM

## 2012-11-30 ENCOUNTER — Ambulatory Visit: Payer: Managed Care, Other (non HMO) | Admitting: Physician Assistant

## 2013-02-18 ENCOUNTER — Other Ambulatory Visit: Payer: Self-pay | Admitting: Physician Assistant

## 2013-03-22 ENCOUNTER — Other Ambulatory Visit: Payer: Self-pay | Admitting: Physician Assistant

## 2013-03-22 ENCOUNTER — Other Ambulatory Visit: Payer: Self-pay | Admitting: Neurology

## 2013-03-24 ENCOUNTER — Other Ambulatory Visit: Payer: Self-pay

## 2013-03-24 MED ORDER — ARMODAFINIL 250 MG PO TABS: 250.0000 mg | ORAL_TABLET | Freq: Every day | ORAL | Status: DC

## 2013-03-24 NOTE — Telephone Encounter (Signed)
Rx signed and faxed.

## 2013-04-19 ENCOUNTER — Ambulatory Visit (INDEPENDENT_AMBULATORY_CARE_PROVIDER_SITE_OTHER): Payer: Managed Care, Other (non HMO) | Admitting: Physician Assistant

## 2013-04-19 ENCOUNTER — Encounter: Payer: Self-pay | Admitting: Physician Assistant

## 2013-04-19 VITALS — BP 127/82 | HR 73 | Temp 98.1°F | Resp 16 | Ht 68.0 in | Wt 275.0 lb

## 2013-04-19 DIAGNOSIS — G609 Hereditary and idiopathic neuropathy, unspecified: Secondary | ICD-10-CM

## 2013-04-19 DIAGNOSIS — R41840 Attention and concentration deficit: Secondary | ICD-10-CM

## 2013-04-19 DIAGNOSIS — B999 Unspecified infectious disease: Secondary | ICD-10-CM

## 2013-04-19 DIAGNOSIS — G629 Polyneuropathy, unspecified: Secondary | ICD-10-CM

## 2013-04-19 DIAGNOSIS — H9212 Otorrhea, left ear: Secondary | ICD-10-CM

## 2013-04-19 DIAGNOSIS — B351 Tinea unguium: Secondary | ICD-10-CM

## 2013-04-19 DIAGNOSIS — G0400 Acute disseminated encephalitis and encephalomyelitis, unspecified: Secondary | ICD-10-CM

## 2013-04-19 DIAGNOSIS — E669 Obesity, unspecified: Secondary | ICD-10-CM

## 2013-04-19 DIAGNOSIS — I10 Essential (primary) hypertension: Secondary | ICD-10-CM

## 2013-04-19 DIAGNOSIS — H9210 Otorrhea, unspecified ear: Secondary | ICD-10-CM

## 2013-04-19 DIAGNOSIS — G4730 Sleep apnea, unspecified: Secondary | ICD-10-CM | POA: Insufficient documentation

## 2013-04-19 DIAGNOSIS — E782 Mixed hyperlipidemia: Secondary | ICD-10-CM

## 2013-04-19 LAB — COMPREHENSIVE METABOLIC PANEL
ALT: 53 U/L (ref 0–53)
AST: 23 U/L (ref 0–37)
Albumin: 4.4 g/dL (ref 3.5–5.2)
Alkaline Phosphatase: 59 U/L (ref 39–117)
BUN: 14 mg/dL (ref 6–23)
CO2: 28 mEq/L (ref 19–32)
Calcium: 9.6 mg/dL (ref 8.4–10.5)
Chloride: 102 mEq/L (ref 96–112)
Creat: 0.97 mg/dL (ref 0.50–1.35)
Glucose, Bld: 88 mg/dL (ref 70–99)
Potassium: 4.1 mEq/L (ref 3.5–5.3)
Sodium: 140 mEq/L (ref 135–145)
Total Bilirubin: 0.5 mg/dL (ref 0.3–1.2)
Total Protein: 6.9 g/dL (ref 6.0–8.3)

## 2013-04-19 LAB — LIPID PANEL
Cholesterol: 157 mg/dL (ref 0–200)
HDL: 33 mg/dL — ABNORMAL LOW (ref >39)
Total CHOL/HDL Ratio: 4.8 Ratio
Triglycerides: 451 mg/dL — ABNORMAL HIGH (ref <150)

## 2013-04-19 LAB — POCT GLYCOSYLATED HEMOGLOBIN (HGB A1C): Hemoglobin A1C: 5

## 2013-04-19 LAB — POCT SKIN KOH: Skin KOH, POC: NEGATIVE

## 2013-04-19 MED ORDER — LISINOPRIL-HYDROCHLOROTHIAZIDE 10-12.5 MG PO TABS: 1.0000 | ORAL_TABLET | Freq: Every day | ORAL | Status: DC

## 2013-04-19 MED ORDER — GABAPENTIN 300 MG PO CAPS: 300.0000 mg | ORAL_CAPSULE | Freq: Three times a day (TID) | ORAL | Status: DC

## 2013-04-19 NOTE — Progress Notes (Signed)
   [address] [place] [address]   Phone [phone]  Subjective:    Patient ID: Brandon [name], male    DOB: [birth year], 42 y.o.   MRN: [id number]  HPI Pt presents to clinic for med refill and lab check. 1- h/u hyperinsulinemia - he is on metformin to decrease his glucose release to hopefully prevent insulin release - he has trouble losing weight due to this condition. 2- Sleep apnea - see Dr Robynn Pane - he was still having problems with daytime sleepiness even with good use of CPAP - so she started him on Nuvigil to see if it would help - he thinks he might have helped his fatigue during the day a little but he still complains of lack of focus at home - he has been diagnosed with possible ADD in the past but he had not been diagnosed with ADEM at the time so he is unsure if his lack of focus is related to untreated ADD or his ADEM - he is interested in testing for this 3- he uses his Gabapentin and it helps with the pain in his feet - he still has really dry feet and toenails are thick and discolored and he would like to see a podiatrist. 4- He uses the Flexeril for muscle spasms. 5- h/o elevated triglycerides - he has tried to change his diet and he would like to know if it has helped. 6- he has a bad odor coming out of his L ear - he has used cotton swabs in the past but was told to stop.  Review of Systems     Objective:   Physical Exam  Vitals reviewed. Constitutional: He is oriented to person, place, and time. He appears well-developed and well-nourished.  HENT:  Head: Normocephalic and atraumatic.  Right Ear: External ear normal.  Left Ear: External ear normal.  Eyes: Conjunctivae are normal.  Neck: Normal range of motion.  Cardiovascular: Normal rate, regular rhythm and normal heart sounds.   No murmur heard. Pulmonary/Chest: Effort normal and breath sounds normal.  Musculoskeletal:  Pt walks with a cane.  Neurological: He is alert and oriented to person, place, and  time.  Skin: Skin is warm and dry.  Dry skin with onychomycotic looking nails.  Psychiatric: He has a normal mood and affect. His behavior is normal. Judgment and thought content normal.       Assessment & Plan:  ADEM (acute disseminated encephalomyelitis) - Will talk with [place] [facility] to see if they will be able to help with ADEM and possible adult ADD.  Spoke with them and a referral was made.  Peripheral neuropathy -Continue current meds. Plan: gabapentin (NEURONTIN) 300 MG capsule  Elevated cholesterol with high triglycerides - Will treat if indicated. Plan: Lipid panel, Comprehensive metabolic panel, POCT glycosylated hemoglobin (Hb A1C)  Obesity, unspecified - Plan: Insulin, Fasting  HTN (hypertension) - Continue current plan. Plan: lisinopril-hydrochlorothiazide (PRINZIDE,ZESTORETIC) 10-12.5 MG per tablet  Ear discharge of left ear - Plan: POCT Skin KOH  Onychomycosis - Plan: Ambulatory referral to Podiatry  Sleep apnea  Benny Lennert PA-C 04/20/2013 9:14 PM

## 2013-04-21 LAB — INSULIN, FASTING: Insulin fasting, serum: 67 u[IU]/mL — ABNORMAL HIGH (ref 3–28)

## 2013-04-25 ENCOUNTER — Other Ambulatory Visit: Payer: Self-pay | Admitting: Physician Assistant

## 2013-04-25 MED ORDER — ATORVASTATIN CALCIUM 10 MG PO TABS: 10.0000 mg | ORAL_TABLET | Freq: Every day | ORAL | Status: DC

## 2013-05-20 ENCOUNTER — Other Ambulatory Visit: Payer: Self-pay | Admitting: Physician Assistant

## 2013-06-02 ENCOUNTER — Other Ambulatory Visit: Payer: Self-pay

## 2013-06-02 DIAGNOSIS — G629 Polyneuropathy, unspecified: Secondary | ICD-10-CM

## 2013-06-02 MED ORDER — GABAPENTIN 300 MG PO CAPS: 300.0000 mg | ORAL_CAPSULE | Freq: Three times a day (TID) | ORAL | Status: DC

## 2013-06-14 ENCOUNTER — Ambulatory Visit: Payer: Managed Care, Other (non HMO) | Admitting: Physician Assistant

## 2013-06-26 ENCOUNTER — Other Ambulatory Visit: Payer: Self-pay | Admitting: Physician Assistant

## 2013-08-09 ENCOUNTER — Ambulatory Visit: Payer: Managed Care, Other (non HMO)

## 2013-08-09 ENCOUNTER — Encounter: Payer: Self-pay | Admitting: Physician Assistant

## 2013-08-09 ENCOUNTER — Ambulatory Visit (INDEPENDENT_AMBULATORY_CARE_PROVIDER_SITE_OTHER): Payer: Managed Care, Other (non HMO) | Admitting: Internal Medicine

## 2013-08-09 VITALS — BP 142/102 | HR 75 | Temp 97.8°F | Resp 16 | Ht 68.0 in | Wt 283.9 lb

## 2013-08-09 DIAGNOSIS — G0400 Acute disseminated encephalitis and encephalomyelitis, unspecified: Secondary | ICD-10-CM

## 2013-08-09 DIAGNOSIS — R41840 Attention and concentration deficit: Secondary | ICD-10-CM

## 2013-08-09 DIAGNOSIS — M25569 Pain in unspecified knee: Secondary | ICD-10-CM

## 2013-08-09 DIAGNOSIS — M25561 Pain in right knee: Secondary | ICD-10-CM

## 2013-08-09 DIAGNOSIS — R5381 Other malaise: Secondary | ICD-10-CM

## 2013-08-09 DIAGNOSIS — Z0000 Encounter for general adult medical examination without abnormal findings: Secondary | ICD-10-CM

## 2013-08-09 DIAGNOSIS — R5383 Other fatigue: Secondary | ICD-10-CM

## 2013-08-09 DIAGNOSIS — Z113 Encounter for screening for infections with a predominantly sexual mode of transmission: Secondary | ICD-10-CM

## 2013-08-09 DIAGNOSIS — H9190 Unspecified hearing loss, unspecified ear: Secondary | ICD-10-CM

## 2013-08-09 DIAGNOSIS — N529 Male erectile dysfunction, unspecified: Secondary | ICD-10-CM

## 2013-08-09 LAB — COMPLETE METABOLIC PANEL WITH GFR
ALT: 66 U/L — ABNORMAL HIGH (ref 0–53)
AST: 27 U/L (ref 0–37)
Albumin: 4.9 g/dL (ref 3.5–5.2)
Alkaline Phosphatase: 60 U/L (ref 39–117)
BUN: 8 mg/dL (ref 6–23)
CO2: 26 mEq/L (ref 19–32)
Calcium: 9.8 mg/dL (ref 8.4–10.5)
Chloride: 105 mEq/L (ref 96–112)
Creat: 0.81 mg/dL (ref 0.50–1.35)
GFR, Est African American: >89 mL/min
GFR, Est Non African American: >89 mL/min
Glucose, Bld: 81 mg/dL (ref 70–99)
Potassium: 4 mEq/L (ref 3.5–5.3)
Sodium: 143 mEq/L (ref 135–145)
Total Bilirubin: 0.6 mg/dL (ref 0.2–1.2)
Total Protein: 7.1 g/dL (ref 6.0–8.3)

## 2013-08-09 LAB — POCT URINALYSIS DIPSTICK
Bilirubin, UA: NEGATIVE
Blood, UA: NEGATIVE
Glucose, UA: NEGATIVE
Ketones, UA: NEGATIVE
Leukocytes, UA: NEGATIVE
Nitrite, UA: NEGATIVE
Protein, UA: NEGATIVE
Spec Grav, UA: 1.025
Urobilinogen, UA: 0.2
pH, UA: 6

## 2013-08-09 LAB — CBC
HCT: 52.6 % — ABNORMAL HIGH (ref 39.0–52.0)
Hemoglobin: 19 g/dL — ABNORMAL HIGH (ref 13.0–17.0)
MCH: 28.2 pg (ref 26.0–34.0)
MCHC: 36.1 g/dL — ABNORMAL HIGH (ref 30.0–36.0)
MCV: 78 fL (ref 78.0–100.0)
Platelets: 161 10*3/uL (ref 150–400)
RBC: 6.74 MIL/uL — ABNORMAL HIGH (ref 4.22–5.81)
RDW: 14.7 % (ref 11.5–15.5)
WBC: 5.5 10*3/uL (ref 4.0–10.5)

## 2013-08-09 LAB — LIPID PANEL
Cholesterol: 129 mg/dL (ref 0–200)
HDL: 39 mg/dL — ABNORMAL LOW (ref >39)
LDL Cholesterol: 40 mg/dL (ref 0–99)
Total CHOL/HDL Ratio: 3.3 Ratio
Triglycerides: 252 mg/dL — ABNORMAL HIGH (ref <150)
VLDL: 50 mg/dL — ABNORMAL HIGH (ref 0–40)

## 2013-08-09 LAB — TSH: TSH: 1.452 u[IU]/mL (ref 0.350–4.500)

## 2013-08-09 NOTE — Patient Instructions (Signed)
[facility]  [facility]  [address] [place], [place] [address] Phone: [phone] Fax: [phone]

## 2013-08-09 NOTE — Progress Notes (Signed)
Subjective:    Patient ID: Brandon [name], male    DOB: 05-15-1971, 43 y.o.   MRN: [id number]  HPI  Patient presents for CPE.  He comes in with a list of concerns that he would like addressed today.  He continues to have tremors, tics, speech issues and memory issues. He was unable to schedule appointment with neuropsych due to insurance. [place]  He would like to be evaluated for his lack of focus.  He might be interested in a cash pay if he can get some answers.  Nothing above seems worse than normal to patient - his focus is what is bothering him because the rest he has learned to deal with.  He reports chronic right knee pain. Pain feels like bone grinding on bone. He walks with a cane due to his instability and weakness of his R leg, but the cane does not provide any relief for knee pain. He has not taken any medication for knee pain.   He reports that insomnia and fatigue are slowly returning. He is planning to schedule appointment with sleep doctor to discuss additional treatment with oxygen because he the best he has felt is when he was on oxygen in the hospital. He notes that he felt much more alert and less "sluggish".   He uses his CPAP regularly as well as him Nuvigil.  He reports erectile dysfunction that have been progressively worsening over the past 3 years. He is interested in sex mentally but not physically. He is able to get an erection and sustain it, but he is just feeling very fatigued which causes him to not want to put worth the effort to have sex. He has two sexual partners at this time - reports both of his partners are up to date on STI testing - though he would like to complete that today.  He reports frequent, intermittent abdominal pain and continues to have frequent nausea, diarrhea and constipation. This is not different for him -   He has noticed as well as his other family members that his hearing has decreased esp when  listening to male voices.  Review of Systems  HENT: Negative.   Eyes: Negative.   Respiratory: Negative.   Cardiovascular: Negative.   Gastrointestinal: Positive for nausea, diarrhea and constipation. Negative for vomiting.  Genitourinary: Negative.   Musculoskeletal: Positive for arthralgias.  Skin: Negative.   Neurological: Positive for tremors, weakness and numbness.  Psychiatric/Behavioral: Positive for confusion and decreased concentration. The patient is nervous/anxious.        Objective:   Physical Exam  Vitals reviewed. Constitutional: He is oriented to person, place, and time. He appears well-developed and well-nourished.  HENT:  Head: Normocephalic and atraumatic.  Right Ear: Tympanic membrane, external ear and ear canal normal.  Left Ear: Tympanic membrane, external ear and ear canal normal.  Nose: Nose normal.  Mouth/Throat: Uvula is midline, oropharynx is clear and moist and mucous membranes are normal.  Eyes: Conjunctivae and lids are normal. Pupils are equal, round, and reactive to light. Right eye exhibits abnormal extraocular motion. Left eye exhibits abnormal extraocular motion.  Neck: Normal range of motion. Neck supple.  Cardiovascular: Normal rate, regular rhythm and normal heart sounds.   No murmur heard. Pulmonary/Chest: Effort normal and breath sounds normal. He has no wheezes.  Abdominal: Soft. Bowel sounds are normal. There is no tenderness.  Central obesity.  Musculoskeletal:       Right knee:  He exhibits LCL laxity. He exhibits normal range of motion, normal patellar mobility and no MCL laxity. Tenderness found. Lateral joint line tenderness noted. No medial joint line tenderness noted.       Left knee: He exhibits normal range of motion. No tenderness found.  During knee exam patient experience pain in his right hip - he had no pain proximal tibia.  Neurological: He is alert and oriented to person, place, and time. He displays no tremor. No cranial  nerve deficit or sensory deficit. He exhibits abnormal muscle tone (right thigh (normal for patient)).  Reflex Scores:      Patellar reflexes are 1+ on the right side and 2+ on the left side. 5/5 strength in left extremities.  4/5 strength in right extremities.   Skin: Skin is warm and dry. No rash noted.  Psychiatric: He has a normal mood and affect. His speech is normal and behavior is normal. Judgment and thought content normal.   Right knee xray reviewed with Dr. Merla Riches - opaque density near proximal tibia? Possibly artifact?   Audiometry - moderate to moderate sever hearing loss worse in the left -- see scanned    Assessment & Plan:    1. Annual physical exam 2.Screening for STD (sexually transmitted disease) - he had already given a clean catch urine - we will check uriprobe at next visit 3. ADEM (acute disseminated encephalomyelitis) - continue f/u with neurologist at [facility] 4. Other malaise and fatigue/lack of focus Strongly encouraged patient to schedule appointment with neuropsychiatrist regardless of insurance. He agrees with importance of this referral and is going to contact his neurologist to see if their office may be able to authorize the appointment through his insurance. He not, he will still make appointment as selfpay. Will call with lab results.  - HIV antibody - Hepatitis C antibody - Hepatitis B surface antigen - RPR - Follicle Stimulating Hormone - Luteinizing hormone - Prolactin - CBC - COMPLETE METABOLIC PANEL WITH GFR - TSH - Lipid panel - Testosterone - Testosterone, free - Testosterone, % free - Sex hormone binding globulin - POCT urinalysis dipstick  5. Erectile dysfunction Possibly due to low testosterone. If labs are abnormal, treat underlying cause. If all labs are normal, will prescribe Cialis which he has used in the past with good results if he is interested.   6. Right knee pain Possible patellofemoral syndrome. Waiting on reading of  xray from radiologist. Consider MRI and referral to ortho based on radiology reading.  - DG Knee 1-2 Views Right; Future  7. Hearing loss Patient has decreased hearing loss per audiogram done in office today. Moderately severe loss in LEFT ear. Moderate loss in RIGHT ear. Will refer to audiology.  - PR TYMPANOMETRY   I have reviewed and agree with documentation. Robert P. Merla Riches, M.D.

## 2013-08-10 ENCOUNTER — Other Ambulatory Visit: Payer: Self-pay | Admitting: Physician Assistant

## 2013-08-10 DIAGNOSIS — H9190 Unspecified hearing loss, unspecified ear: Secondary | ICD-10-CM

## 2013-08-10 LAB — HEPATITIS C ANTIBODY: HCV Ab: NEGATIVE

## 2013-08-10 LAB — RPR

## 2013-08-10 LAB — SEX HORMONE BINDING GLOBULIN: Sex Hormone Binding: 24 nmol/L (ref 13–71)

## 2013-08-10 LAB — HEPATITIS B SURFACE ANTIGEN: Hepatitis B Surface Ag: NEGATIVE

## 2013-08-10 LAB — FOLLICLE STIMULATING HORMONE: FSH: 3.2 m[IU]/mL (ref 1.4–18.1)

## 2013-08-10 LAB — PROLACTIN: Prolactin: 4.8 ng/mL (ref 2.1–17.1)

## 2013-08-10 LAB — TESTOSTERONE: Testosterone: 309 ng/dL (ref 300–890)

## 2013-08-10 LAB — LUTEINIZING HORMONE: LH: 1.7 m[IU]/mL (ref 1.5–9.3)

## 2013-08-10 LAB — TESTOSTERONE, FREE: Testosterone, Free: 72.5 pg/mL (ref 47.0–244.0)

## 2013-08-10 LAB — TESTOSTERONE, % FREE: Testosterone-% Free: 2.3 % (ref 1.6–2.9)

## 2013-08-10 LAB — HIV ANTIBODY (ROUTINE TESTING W REFLEX): HIV: NONREACTIVE

## 2013-08-14 ENCOUNTER — Other Ambulatory Visit: Payer: Self-pay | Admitting: Physician Assistant

## 2013-08-15 ENCOUNTER — Other Ambulatory Visit: Payer: Self-pay | Admitting: Physician Assistant

## 2013-08-15 DIAGNOSIS — M25561 Pain in right knee: Secondary | ICD-10-CM

## 2013-08-16 MED ORDER — ATORVASTATIN CALCIUM 10 MG PO TABS: 10.0000 mg | ORAL_TABLET | Freq: Every day | ORAL | Status: DC

## 2013-08-16 NOTE — Progress Notes (Signed)
Sent message to [name] to see if she would like me to schedule appointment with her for the follow-up.

## 2013-08-18 ENCOUNTER — Other Ambulatory Visit: Payer: Self-pay

## 2013-08-18 MED ORDER — ATORVASTATIN CALCIUM 10 MG PO TABS: 10.0000 mg | ORAL_TABLET | Freq: Every day | ORAL | Status: DC

## 2013-08-23 ENCOUNTER — Other Ambulatory Visit: Payer: Self-pay | Admitting: Physician Assistant

## 2013-08-24 ENCOUNTER — Encounter: Payer: Self-pay | Admitting: Physician Assistant

## 2013-08-24 DIAGNOSIS — H9190 Unspecified hearing loss, unspecified ear: Secondary | ICD-10-CM | POA: Insufficient documentation

## 2013-09-01 ENCOUNTER — Ambulatory Visit: Payer: Managed Care, Other (non HMO) | Admitting: Neurology

## 2013-09-01 ENCOUNTER — Telehealth: Payer: Self-pay | Admitting: Neurology

## 2013-09-01 NOTE — Telephone Encounter (Signed)
Pt had apt w/Dr. Vickey Huger on 08/31/13, office closed due to weather. Pt needs to get in before Aug due to Insurance and pt also needs to speak with Dr. Vickey Huger concerning getting him oxygen,. pt has trouble staying alert.  Please call pt to r/s sooner.

## 2013-09-05 NOTE — Telephone Encounter (Signed)
Patient verbalized that he wants to discuss the possibility of using oxygen in the daytime, he is currently using a cpap machine.  Patient verbalized that he will discuss with Dr. Vickey Huger at his appointment on April 1.

## 2013-10-04 ENCOUNTER — Ambulatory Visit (INDEPENDENT_AMBULATORY_CARE_PROVIDER_SITE_OTHER): Payer: Managed Care, Other (non HMO) | Admitting: Neurology

## 2013-10-04 ENCOUNTER — Encounter: Payer: Self-pay | Admitting: Neurology

## 2013-10-04 VITALS — BP 126/77 | HR 67 | Resp 16 | Ht 68.5 in | Wt 290.0 lb

## 2013-10-04 DIAGNOSIS — G4710 Hypersomnia, unspecified: Secondary | ICD-10-CM | POA: Insufficient documentation

## 2013-10-04 DIAGNOSIS — G4730 Sleep apnea, unspecified: Secondary | ICD-10-CM

## 2013-10-04 DIAGNOSIS — Z9989 Dependence on other enabling machines and devices: Secondary | ICD-10-CM | POA: Insufficient documentation

## 2013-10-04 DIAGNOSIS — G0481 Other encephalitis and encephalomyelitis: Secondary | ICD-10-CM

## 2013-10-04 DIAGNOSIS — G4733 Obstructive sleep apnea (adult) (pediatric): Secondary | ICD-10-CM | POA: Insufficient documentation

## 2013-10-04 MED ORDER — ARMODAFINIL 250 MG PO TABS: 250.0000 mg | ORAL_TABLET | Freq: Every day | ORAL | Status: DC

## 2013-10-04 NOTE — Progress Notes (Signed)
[facility]  Provider:  Melvyn Novasarmen  Alanys Godino, M D  Referring Provider: Bary RichardMcClung, Angela M, PA-C Primary Care Physician:  Virgilio BellingWEBER,[name], PA-C    HPI:  Brandon [name] is a 43 y.o. male  Is seen here as a referral  from PA, Georgian Congela  McClung for persistent Hypersomnia while compliant with CPAP.   His primary neurologist is Dr Marjory LiesPenumalli, who initially took his are over under the Dx of MS, and after [facility]inyan[facility] with a diagnosis of ADEM.   Has not resumed that a chickenpox infection at  6  or febrile illness at 43 years of age may have caused today's deficits.  The patient was diagnosed with sleep apnea after a sleep test was performed on 12-26-2009. The patient was diagnosed with sleep apnea, if that study confirmed an AHI of 29.3, which increased in supine sleep position for 54.7 and during REM sleep to 51.4.  The oxygen nadir was 79% but not maintained very long .The patient was thunderously snoring at the time. Please note that CO2 was not measured at the time . The  patient's neck circumference and body mass index would today call for this measurement ; at the time he had a BMI of 44 and a neck circumference of 18 inches.  Endorsed Epworth score at 12/ 24  and the Beck Depression Inventory at 11 points. He was titrated to 11 cm water. The patient remained well compliant with CPAP treatment but still suffered persistent from hypersomnia. He gets some relief by using Nuvigil , but  effectiveness seems to have lessened over the last years.   Currently reviewing a 90 day download from his CPAP machine, he has a user  time of 8 hours 22 minutes and 98% compliance in the download over the last 90 days. His residual AHI is only 1.2 . he is using 11 cm water without   EPR and air leak is moderate.  The patient's Epworth Sleepiness Scale was today and was at 10 pints white on the vigil in his fatigue severity scale is 41 points when on the Nuvigil and can be 58 without.    Sleep habits, his bedtime is 2 AM and he has severe anxiety when not falling asleep promptly, he now takes melatonin at midnight. Melatonin helps to reduce sleep latency. He sleeps 8 hours or more each night, 2 nocturias, he doesn't snore on CPAP.  He rises at 10 AM , 11 AM he exposes himself to daylight. He has eye pain when looking into the sunlight, photosensitivity.  He is a hobby gardener.  He rarely drinks ETOH, he use no caffee as it increases anxiety.  He drinks some sweet ice tea.                 Review of Systems: Out of a complete 14 system review, the patient complains of only the following symptoms, and all other reviewed systems are negative. FSS 41 , Epworth 11 on Nuvigil.    History   Social History  . Marital Status: Single    Spouse Name: N/A    Number of Children: N/A  . Years of Education: N/A   Occupational History  . Not on file.   Social History Main Topics  . Smoking status: Former Smoker -- 3.00 packs/day for 3 years    Types: Cigarettes    Quit date: 08/18/1987  . Smokeless tobacco: Never Used  . Alcohol Use: No     Comment: 07/04/11 "glass  of wine or spirits once a month"  . Drug Use: No  . Sexual Activity: Yes    Partners: Female   Other Topics Concern  . Not on file   Social History Narrative   Lives in [place] with 2 domestic male partners "his family unit"    Family History  Problem Relation Age of Onset  . Coronary artery disease Father   . Stroke Father   . Diabetes Mother   . Cancer Maternal Grandmother     bone  . Cancer Other     Stomach -Great-great grandmother    Past Medical History  Diagnosis Date  . Hypertension   . MS (multiple sclerosis)   . Pneumonia 2010  . Hyperinsulinemia 07/04/11    "I'm on metformin cause I produce too much insulin"  . Headache(784.0)   . TIA (transient ischemic attack) 2000    denies residual  . Anxiety 2005  . Stroke 2000  . Diabetes mellitus 2012  . TIA (transient  ischemic attack)   . Sleep apnea     uses CPAP at home  . Depression 2005  . Mononucleosis, infectious, with hepatitis 06/2011  . Hearing loss   . Nausea     occasional per history form 08/18/11  . Allergy   . Hyperlipidemia   . Neuromuscular disorder     Past Surgical History  Procedure Laterality Date  . Tonsillectomy and adenoidectomy  ~ 1980  . Myringotomy  1979; 1980    bilaterally  . Colonoscopy  2003  . Esophagogastroduodenoscopy  2003  . Esophagogastroduodenoscopy  07/28/2011    Procedure: ESOPHAGOGASTRODUODENOSCOPY (EGD);  Surgeon: [name], MD;  Location: [facility] ENDOSCOPY;  Service: Endoscopy;  Laterality: N/A;  . Cholecystectomy  08/02/2011    Procedure: LAPAROSCOPIC CHOLECYSTECTOMY;  Surgeon: Shelly Rubenstein, MD;  Location: [facility] OR;  Service: General;;    Current Outpatient Prescriptions  Medication Sig Dispense Refill  . aspirin 81 MG tablet Take 81 mg by mouth daily.      [name] atorvastatin (LIPITOR) 10 MG tablet Take 1 tablet (10 mg total) by mouth daily.  90 tablet  1  . cyclobenzaprine (FLEXERIL) 10 MG tablet Take 10 mg by mouth 3 (three) times daily as needed for muscle spasms.      [name] lisinopril-hydrochlorothiazide (PRINZIDE,ZESTORETIC) 10-12.5 MG per tablet Take 1 tablet by mouth daily.  90 tablet  1  . Melatonin 3 MG CAPS Take by mouth.      . metFORMIN (GLUCOPHAGE) 1000 MG tablet 1 tab twice daily  180 tablet  2  . Naftifine HCl (NAFTIN) 2 % CREA Apply topically daily.      . Armodafinil (NUVIGIL) 250 MG tablet Take 125 mg by mouth daily.      [name] gabapentin (NEURONTIN) 300 MG capsule Take 1 capsule (300 mg total) by mouth 3 (three) times daily.  270 capsule  1   No current facility-administered medications for this visit.    Allergies as of 10/04/2013 - Review Complete 10/04/2013  Allergen Reaction Noted  . Penicillin g Anaphylaxis 10/04/2013  . Sulfa antibiotics Other (See Comments) 07/03/2011  . Amoxicillin  04/19/2013  . Cephalosporins Other (See Comments)  07/03/2011  . Penicillins Other (See Comments) 07/03/2011  . Quinolones Other (See Comments)   . Sulfamethoxazole Swelling 10/04/2013    Vitals: BP 126/77  Pulse 67  Resp 16  Ht 5' 8.5" (1.74 m)  Wt 290 lb (131.543 kg)  BMI 43.45 kg/m2 Last Weight:  Wt Readings from Last 1 Encounters:  10/04/13 290 lb (131.543 kg)   Last Height:   Ht Readings from Last 1 Encounters:  10/04/13 5' 8.5" (1.74 m)   Physical exam:  General: The patient is awake, alert and appears not in acute distress. The patient is well groomed. Head: Normocephalic, atraumatic. Neck is supple. Mallampati 4, neck circumference: 20 inches. Cardiovascular:  Regular rate and rhythm, without  murmurs or carotid bruit, and without distended neck veins. Respiratory: Lungs are clear to auscultation. Skin:  Without evidence of edema, or rash Trunk: BMI is  elevated and patient  has normal posture.  Neurologic exam : The patient is awake and alert, oriented to place and time.  Memory subjective  described as intact. There is a normal attention span & concentration ability. Speech is fluent without  dysarthria, dysphonia or aphasia. Mood and affect are appropriate.  Cranial nerves: Pupils are equal and briskly reactive to light. Funduscopic exam without  evidence of pallor or edema. Extraocular movements  in vertical and horizontal planes intact and without nystagmus. Visual fields by finger perimetry are intact. Hearing to finger rub intact.  Facial sensation intact to fine touch. Facial motor strength is symmetric and tongue and uvula move midline.  Motor exam:  Deferred . Sensory:  Fine touch, pinprick and vibration were tested in all extremities.  Vibration in both feet reduced. Proprioception is normal for the upper extre. .    Gait and station: Patient walks with  assistive device and is using a cane , tripod base.   Assessment:  After physical and neurologic examination, review of laboratory studies, imaging,  neurophysiology testing and pre-existing records, assessment is   ADEM , followed by Dres. Penumalli and Pinyan.  OSA - alleviated under CPAP 11 cm , but perstent fatigue and hypersomnia.  Will need check for hypoxia at night while on CPAP.  Coninue working on sleep hygiene. Gabapentin.   Plan:  Treatment plan and additional workup :  I will order a ON for this patient on CPAP, [employer] . 45 minute visit.

## 2013-10-04 NOTE — Patient Instructions (Signed)
Acute Disseminated Encephalomyelitis Acute disseminated encephalomyelitis (ADE) is a neurological disorder. It is characterized by swelling (inflammation) of the brain and spinal cord. This is caused by damage to the myelin sheath. The myelin sheath is the fatty covering on nerve fibers in the brain. It acts as an Teacher, English as a foreign language. CAUSES  ADE may occur:  With a viral or germ (bacterial ) infection.  As a complication of inoculation or vaccination.  With no preceding cause. SYMPTOMS  This disorder starts suddenly. It occurs in children more often than in adults. Problems (symptoms ) vary among individuals. They may include:  Headache.  Delirium.  Lethargy.  Coma.  Seizures (convulsions).  Stiff neck.  Fever.  Ataxia (uncoordinated or unsteady movements) or (lack of muscle control).  Optic neuritis.  Transverse myelitis.  Vomiting.  Weight loss.  Paralysis of a single limb (monoparesis).  Paralysis on one side of the body (hemiplegia). TREATMENT  Generally, treatment for ADE includes corticosteroid medications. Other treatment is based on symptoms and supportive. PROGNOSIS  The outcome for individuals with ADE varies. Some patients achieve complete or nearly complete recovery. Others may have remaining problems. Some severe cases of ADE may be fatal. Overall, the prognosis is good when the disorder is:  Diagnosed early.  Treated promptly. Document Released: 03/14/2002 Document Revised: 09/14/2011 Document Reviewed: 06/22/2005 [facility] Patient Information 2014 [place], [place].

## 2013-10-13 ENCOUNTER — Other Ambulatory Visit: Payer: Self-pay | Admitting: Physician Assistant

## 2013-12-11 ENCOUNTER — Encounter: Payer: Self-pay | Admitting: Physician Assistant

## 2013-12-11 DIAGNOSIS — F411 Generalized anxiety disorder: Secondary | ICD-10-CM | POA: Insufficient documentation

## 2013-12-11 DIAGNOSIS — F958 Other tic disorders: Secondary | ICD-10-CM | POA: Insufficient documentation

## 2014-01-06 ENCOUNTER — Other Ambulatory Visit: Payer: Self-pay | Admitting: Physician Assistant

## 2014-01-06 ENCOUNTER — Other Ambulatory Visit: Payer: Self-pay | Admitting: Internal Medicine

## 2014-02-07 ENCOUNTER — Ambulatory Visit: Payer: Managed Care, Other (non HMO) | Admitting: Physician Assistant

## 2014-03-06 ENCOUNTER — Ambulatory Visit: Payer: Managed Care, Other (non HMO) | Admitting: Neurology

## 2014-07-02 ENCOUNTER — Other Ambulatory Visit: Payer: Self-pay | Admitting: Physician Assistant

## 2014-07-02 ENCOUNTER — Other Ambulatory Visit: Payer: Self-pay | Admitting: Internal Medicine

## 2014-09-05 ENCOUNTER — Encounter: Payer: Self-pay | Admitting: Neurology

## 2014-09-05 ENCOUNTER — Ambulatory Visit (INDEPENDENT_AMBULATORY_CARE_PROVIDER_SITE_OTHER): Payer: Managed Care, Other (non HMO) | Admitting: Neurology

## 2014-09-05 VITALS — BP 140/82 | HR 84 | Resp 16 | Ht 68.0 in | Wt 299.0 lb

## 2014-09-05 DIAGNOSIS — B89 Unspecified parasitic disease: Secondary | ICD-10-CM | POA: Diagnosis not present

## 2014-09-05 DIAGNOSIS — G4733 Obstructive sleep apnea (adult) (pediatric): Secondary | ICD-10-CM

## 2014-09-05 DIAGNOSIS — Z9989 Dependence on other enabling machines and devices: Secondary | ICD-10-CM

## 2014-09-05 DIAGNOSIS — G0401 Postinfectious acute disseminated encephalitis and encephalomyelitis (postinfectious ADEM): Secondary | ICD-10-CM | POA: Insufficient documentation

## 2014-09-05 DIAGNOSIS — Q382 Macroglossia: Secondary | ICD-10-CM

## 2014-09-05 NOTE — Progress Notes (Signed)
[facility]  Provider:  Melvyn Novas, M D  Referring Provider: Larkin Ina Primary Care Physician:  Virgilio Belling    HPI:  Brandon [name] is a 44 y.o. male  Is seen herefor persistent Hypersomnia,  while compliant with CPAP.     His primary neurologist was  Dr Marjory Lies, who initially took his are over under the Dx of MS, and after consultation with Dr. Orie Rout, [facility] with a diagnosis of ADEM.  He follows with dr Orie Rout now.  Has not resumed that a chickenpox infection at  6  or febrile illness at 44 years of age may have caused today's deficits.  The patient was diagnosed with sleep apnea after a sleep test was performed on 12-26-2009. The patient was diagnosed with sleep apnea, if that study confirmed an AHI of 29.3, which increased in supine sleep position for 54.7 and during REM sleep to 51.4.  The oxygen nadir was 79% but not maintained very long .The patient was thunderously snoring at the time. Please note that CO2 was not measured at the time . The  patient's neck circumference and body mass index would today call for this measurement ; at the time he had a BMI of 44 and a neck circumference of 18 inches.  Sleep habits, his bedtime is 2 AM and he has severe anxiety when not falling asleep promptly, he now takes melatonin at midnight. Melatonin helps to reduce sleep latency. He sleeps 8 hours or more each night, 2 nocturias, he doesn't snore on CPAP. He rises at 10 AM , 11 AM he exposes himself to daylight. He has eye pain when looking into the sunlight, photosensitivity.  He is a hobby gardener. He rarely drinks ETOH, he use no caffee as it increases anxiety.  He drinks some sweet ice tea.    Interval history on 09-05-14.  Brandon [name] reports that he has been witnessed to snore while using his CPAP. He had also experienced some weight gain. They were also medication changes made through Dr. Consuelo Pandy his primary neurologist at [facility]. He was diagnosed  with bilateral ulnar neuropathies, he is followed there for a DEM. I follow him for his sleep issues. His Epworth sleepiness score today on 09-05-14 is endorsed at 8 points, his fatigue severity however at 53 points. He has self heated himself with sugar and caffeine to stay awake. We were able to get an in office download from his CPAP machine. The patient has used his machine 97% compliance over the last 30 days and 425 also 30 days over 4 hours consecutive use. Average user time is 7 hours and 4 minutes his machine is currently set at 11 cm water pressure and his residual AHI is 1.0 however there are lot of air leaks noted this could either be from the mask getting dislodged or not finding a good air seal the patient also has full facial hair which may make this more difficult. Since he has gained weight I would offer him to change his machine to an auto set between 5 and 15 cm water hoping that a higher pressure may help him to overcome his sudden fatigue resurgence. Dr. Consuelo Pandy had also started the patient on a new  Medication, KLONOPIN, to decrease his anxiety and help his focus .he has undergone a neuropsychological testing battery in [place]. ,  [facility]   He is now diagnosed with learning disabilities.     Review of Systems: Out of  a complete 14 system review, the patient complains of only the following symptoms, and all other reviewed systems are negative. FSS 50 from 41 last visit  , Epworth 8 on Nuvigil. learning disabilities,  ADEM.     History   Social History  . Marital Status: Single    Spouse Name: N/A  . Number of Children: N/A  . Years of Education: N/A   Occupational History  . Not on file.   Social History Main Topics  . Smoking status: Former Smoker -- 3.00 packs/day for 3 years    Types: Cigarettes    Quit date: 08/18/1987  . Smokeless tobacco: Never Used  . Alcohol Use: 0.0 oz/week    0 Standard drinks or equivalent per week     Comment: 07/04/11  "glass of wine or spirits once a month"  . Drug Use: No  . Sexual Activity:    Partners: Female   Other Topics Concern  . Not on file   Social History Narrative   Lives in [place] with 2 domestic male partners "his family unit"   Caffeine 7-8 ounces daily. Mountain dew.    Family History  Problem Relation Age of Onset  . Coronary artery disease Father   . Stroke Father   . Diabetes Mother   . Cancer Maternal Grandmother     bone  . Cancer Other     Stomach -Great-great grandmother    Past Medical History  Diagnosis Date  . Hypertension   . MS (multiple sclerosis)   . Pneumonia 2010  . Hyperinsulinemia 07/04/11    "I'm on metformin cause I produce too much insulin"  . Headache(784.0)   . TIA (transient ischemic attack) 2000    denies residual  . Anxiety 2005  . Stroke 2000  . Diabetes mellitus 2012  . TIA (transient ischemic attack)   . Sleep apnea     uses CPAP at home  . Depression 2005  . Mononucleosis, infectious, with hepatitis 06/2011  . Hearing loss   . Nausea     occasional per history form 08/18/11  . Allergy   . Hyperlipidemia   . Neuromuscular disorder     Past Surgical History  Procedure Laterality Date  . Tonsillectomy and adenoidectomy  ~ 1980  . Myringotomy  1979; 1980    bilaterally  . Colonoscopy  2003  . Esophagogastroduodenoscopy  2003  . Esophagogastroduodenoscopy  07/28/2011    Procedure: ESOPHAGOGASTRODUODENOSCOPY (EGD);  Surgeon: [name], MD;  Location[facility] ENDOSCOPY;  Service: Endoscopy;  Laterality: N/A;  . Cholecystectomy  08/02/2011    Procedure: LAPAROSCOPIC CHOLECYSTECTOMY;  Surgeon: Shelly Rubenstein, MD;  Location: [facility] OR;  Service: General;;    Current Outpatient Prescriptions  Medication Sig Dispense Refill  . Armodafinil (NUVIGIL) 250 MG tablet Take 1 tablet (250 mg total) by mouth daily. 30 tablet 5  . aspirin 81 MG tablet Take 81 mg by mouth daily.    [name] atorvastatin (LIPITOR) 10 MG tablet Take 1 tablet (10 mg  total) by mouth daily. PATIENT NEEDS OFFICE VISIT/FASTING LABS FOR ADDITIONAL REFILLS 30 tablet 0  . cyclobenzaprine (FLEXERIL) 10 MG tablet Take 10 mg by mouth 3 (three) times daily as needed for muscle spasms.    [name] gabapentin (NEURONTIN) 300 MG capsule Take 1 capsule (300 mg total) by mouth 3 (three) times daily. 270 capsule 1  . lisinopril-hydrochlorothiazide (PRINZIDE,ZESTORETIC) 10-12.5 MG per tablet Take 1 tablet by mouth daily. PATIENT NEEDS OFFICE VISIT FOR ADDITIONAL REFILLS 30 tablet 0  .  Melatonin 3 MG CAPS Take by mouth.    . metFORMIN (GLUCOPHAGE) 1000 MG tablet Take 1 tablet (1,000 mg total) by mouth 2 (two) times daily. PATIENT NEEDS OFFICE VISIT FOR ADDITIONAL REFILLS 60 tablet 0  . Naftifine HCl (NAFTIN) 2 % CREA Apply topically daily.     No current facility-administered medications for this visit.    Allergies as of 09/05/2014 - Review Complete 09/05/2014  Allergen Reaction Noted  . Penicillin g Anaphylaxis 10/04/2013  . Sulfa antibiotics Other (See Comments) 07/03/2011  . Amoxicillin  04/19/2013  . Cephalosporins Other (See Comments) 07/03/2011  . Penicillins Other (See Comments) 07/03/2011  . Quinolones Other (See Comments)   . Sulfamethoxazole Swelling 10/04/2013    Vitals: BP 140/82 mmHg  Pulse 84  Resp 16  Ht  (1.727 m)  Wt 299 lb (135.626 kg)  BMI 45.47 kg/m2 Last Weight:  Wt Readings from Last 1 Encounters:  09/05/14 299 lb (135.626 kg)   Last Height:   Ht Readings from Last 1 Encounters:  09/05/14  (1.727 m)   Physical exam:  General: The patient is awake, alert and appears not in acute distress. The patient is well groomed. Head: Normocephalic, atraumatic. Neck is supple. Mallampati 4,  Macroglossia, lisping-  neck circumference: 20 inches.  Cardiovascular:  Regular rate and rhythm, without  murmurs or carotid bruit, and without distended neck veins. Respiratory: Lungs are clear to auscultation. Skin:  Without evidence of edema, or  rash Trunk: BMI is elevated..  Neurologic exam : The patient is awake and alert, oriented to place and time.  Memory subjective  described as intact.  There is a normal attention span & concentration ability.  Speech is fluent with dysarthria, mild lisping- dysphonia , para phrasic errors, some neologism. Reports  Aphasia.  Mood and affect are anxious, speech is pressured.   Cranial nerves: Pupils are equal and briskly reactive to light. Funduscopic exam without  evidence of pallor or edema.  Extraocular movements  in vertical and horizontal planes intact and without nystagmus.  Visual fields by finger perimetry are intact. Hearing to finger rub intact.  Facial sensation intact to fine touch.  Facial motor strength is symmetric and tongue and uvula move midline.  Motor exam:  Deferred . Sensory:  Fine touch, pinprick and vibration were tested in all extremities.  Vibration in both feet reduced. Proprioception is normal for the upper extre. [name] He has ulnar neuropathy bilaterally. Grip strength is reduced, he drops objects.   Gait and station: Patient walks with  assistive device and is using a cane , tripod base.   Assessment:  After physical and neurologic examination, review of laboratory studies, imaging, neurophysiology testing and pre-existing records from [facility] . , assessment of 25 minutes is :   ADEM , followed by D[facility]. Penumalli and P[facility].  OSA - alleviated under CPAP 11 cm , but perstent fatigue and hypersomnia.  High anxiety , was placed on Klonopin.  Coninue working on sleep hygiene. Gabapentin.   Plan:  Treatment plan and additional workup :  Sleep hygiene education,  Dietary adjustment  I will order a ON pulseoximetry  for this patient on CPAP, through DME  [facility] -  This made it a 25 minute visit.  He will see Dr Marjory Lies next for addressing his neuropathy and cognitive evlalution results. Needs MOCA  I see him in 12 month

## 2014-09-05 NOTE — Addendum Note (Signed)
Addended by: Melvyn Novas on: 09/05/2014 04:07 PM   Modules accepted: Orders

## 2014-09-07 ENCOUNTER — Encounter: Payer: Self-pay | Admitting: Neurology

## 2014-10-04 ENCOUNTER — Ambulatory Visit: Payer: Managed Care, Other (non HMO) | Admitting: Neurology

## 2014-10-05 ENCOUNTER — Ambulatory Visit: Payer: Managed Care, Other (non HMO) | Admitting: Neurology

## 2014-12-05 ENCOUNTER — Encounter: Payer: Self-pay | Admitting: Physician Assistant

## 2014-12-05 ENCOUNTER — Ambulatory Visit (INDEPENDENT_AMBULATORY_CARE_PROVIDER_SITE_OTHER): Payer: Managed Care, Other (non HMO) | Admitting: Physician Assistant

## 2014-12-05 VITALS — BP 153/95 | HR 59 | Temp 98.3°F | Resp 18 | Ht 68.5 in | Wt 300.0 lb

## 2014-12-05 DIAGNOSIS — M62838 Other muscle spasm: Secondary | ICD-10-CM | POA: Diagnosis not present

## 2014-12-05 DIAGNOSIS — M542 Cervicalgia: Secondary | ICD-10-CM

## 2014-12-05 DIAGNOSIS — G629 Polyneuropathy, unspecified: Secondary | ICD-10-CM

## 2014-12-05 DIAGNOSIS — I10 Essential (primary) hypertension: Secondary | ICD-10-CM | POA: Diagnosis not present

## 2014-12-05 DIAGNOSIS — G0400 Acute disseminated encephalitis and encephalomyelitis, unspecified: Secondary | ICD-10-CM

## 2014-12-05 DIAGNOSIS — R739 Hyperglycemia, unspecified: Secondary | ICD-10-CM

## 2014-12-05 DIAGNOSIS — F329 Major depressive disorder, single episode, unspecified: Secondary | ICD-10-CM

## 2014-12-05 DIAGNOSIS — R4589 Other symptoms and signs involving emotional state: Secondary | ICD-10-CM

## 2014-12-05 DIAGNOSIS — Z0000 Encounter for general adult medical examination without abnormal findings: Secondary | ICD-10-CM | POA: Diagnosis not present

## 2014-12-05 DIAGNOSIS — E7800 Pure hypercholesterolemia, unspecified: Secondary | ICD-10-CM

## 2014-12-05 DIAGNOSIS — Z130 Encounter for screening for diseases of the blood and blood-forming organs and certain disorders involving the immune mechanism: Secondary | ICD-10-CM

## 2014-12-05 LAB — LIPID PANEL
Cholesterol: 128 mg/dL (ref 0–200)
HDL: 29 mg/dL — ABNORMAL LOW (ref >=40)
LDL Cholesterol: 48 mg/dL (ref 0–99)
Total CHOL/HDL Ratio: 4.4 Ratio
Triglycerides: 254 mg/dL — ABNORMAL HIGH (ref <150)
VLDL: 51 mg/dL — ABNORMAL HIGH (ref 0–40)

## 2014-12-05 LAB — CBC WITH DIFFERENTIAL/PLATELET
Basophils Absolute: 0 10*3/uL (ref 0.0–0.1)
Basophils Relative: 0 % (ref 0–1)
Eosinophils Absolute: 0.1 10*3/uL (ref 0.0–0.7)
Eosinophils Relative: 2 % (ref 0–5)
HCT: 49.3 % (ref 39.0–52.0)
Hemoglobin: 16.7 g/dL (ref 13.0–17.0)
Lymphocytes Relative: 36 % (ref 12–46)
Lymphs Abs: 1.9 10*3/uL (ref 0.7–4.0)
MCH: 27.6 pg (ref 26.0–34.0)
MCHC: 33.9 g/dL (ref 30.0–36.0)
MCV: 81.5 fL (ref 78.0–100.0)
MPV: 10.2 fL (ref 8.6–12.4)
Monocytes Absolute: 0.5 10*3/uL (ref 0.1–1.0)
Monocytes Relative: 9 % (ref 3–12)
Neutro Abs: 2.9 10*3/uL (ref 1.7–7.7)
Neutrophils Relative %: 53 % (ref 43–77)
Platelets: 147 10*3/uL — ABNORMAL LOW (ref 150–400)
RBC: 6.05 MIL/uL — ABNORMAL HIGH (ref 4.22–5.81)
RDW: 15.1 % (ref 11.5–15.5)
WBC: 5.4 10*3/uL (ref 4.0–10.5)

## 2014-12-05 LAB — COMPLETE METABOLIC PANEL WITH GFR
ALT: 50 U/L (ref 0–53)
AST: 23 U/L (ref 0–37)
Albumin: 4.4 g/dL (ref 3.5–5.2)
Alkaline Phosphatase: 57 U/L (ref 39–117)
BUN: 18 mg/dL (ref 6–23)
CO2: 25 mEq/L (ref 19–32)
Calcium: 8.8 mg/dL (ref 8.4–10.5)
Chloride: 104 mEq/L (ref 96–112)
Creat: 0.82 mg/dL (ref 0.50–1.35)
GFR, Est African American: >89 mL/min
GFR, Est Non African American: >89 mL/min
Glucose, Bld: 80 mg/dL (ref 70–99)
Potassium: 3.7 mEq/L (ref 3.5–5.3)
Sodium: 141 mEq/L (ref 135–145)
Total Bilirubin: 0.5 mg/dL (ref 0.2–1.2)
Total Protein: 6.3 g/dL (ref 6.0–8.3)

## 2014-12-05 LAB — POCT GLYCOSYLATED HEMOGLOBIN (HGB A1C): Hemoglobin A1C: 5.5

## 2014-12-05 LAB — TSH: TSH: 2.339 u[IU]/mL (ref 0.350–4.500)

## 2014-12-05 MED ORDER — DICLOFENAC SODIUM 75 MG PO TBEC: 75.0000 mg | DELAYED_RELEASE_TABLET | Freq: Two times a day (BID) | ORAL | Status: DC

## 2014-12-05 MED ORDER — ATORVASTATIN CALCIUM 10 MG PO TABS: 10.0000 mg | ORAL_TABLET | Freq: Every day | ORAL | Status: DC

## 2014-12-05 MED ORDER — METFORMIN HCL ER 500 MG PO TB24: 1000.0000 mg | ORAL_TABLET | Freq: Every day | ORAL | Status: DC

## 2014-12-05 MED ORDER — GABAPENTIN 300 MG PO CAPS: 300.0000 mg | ORAL_CAPSULE | Freq: Three times a day (TID) | ORAL | Status: DC

## 2014-12-05 MED ORDER — CYCLOBENZAPRINE HCL 10 MG PO TABS: 10.0000 mg | ORAL_TABLET | Freq: Three times a day (TID) | ORAL | Status: DC | PRN

## 2014-12-05 MED ORDER — LISINOPRIL-HYDROCHLOROTHIAZIDE 10-12.5 MG PO TABS: 1.0000 | ORAL_TABLET | Freq: Every day | ORAL | Status: DC

## 2014-12-05 NOTE — Progress Notes (Signed)
Subjective:    Patient ID: Brandon [name], male    DOB: July 30, 1970, 44 y.o.   MRN: [id number]  HPI  Pt presents to clinic for his CPE.  He overall is doing poorly due to left sided neck pain that radiates into his left shoulder.  He feels like it is muscular as he does not have weakness or paresthesias but he does have increased pain with neck and left arm movement.  It started about a month ago when he started swimming and he thinks that he is holding his neck funny to keep his face out of the water.  He was not having this problem when he was just walking in the water.  He has found that he rests his chin on a water barbell and that has helped with the pain in his neck.    His partners are worried about depression.  He is down a lot of the time.  He has had problems with this in the past before his diagnosis of ADEM and he is wary of medications for depression.  They did notice that he had improved mood on gabapentin but he has not been on that in a while. He is sleeping with his CPAP.  His neurological symptoms have not changed.  He has depressed mood with depressed thoughts but would never hurt himself but he knows he has not been taking care of himself.  Off meds for the last 3 weeks. He states he has been taking care of his partners and he has forgotten to take care of himself.  Has been swimming for weight loss because he is unable to do other exercise.  He has reached 300lbs and that bothers him.  He does not eat well.  A lot of juice and sweets.  He is currently having problems with his ADEM and has f/u planned with his neurologist.  He is having memory issues as a result.  Patient Active Problem List   Diagnosis Date Noted  . Benign essential HTN 12/05/2014  . Elevated cholesterol 12/05/2014  . Macroglossia 09/05/2014  . Severe obesity (BMI >= 40) 09/05/2014  . ADEM (acute disseminated encephalomyelitis) (postinfectious) 09/05/2014  . Anxiety state, unspecified 12/11/2013  . Motor  tic disorder 12/11/2013  . OSA on CPAP 10/04/2013  . Hypersomnia with sleep apnea, unspecified 10/04/2013  . High frequency hearing loss 08/24/2013  . Peripheral neuropathy 04/19/2013  . Esophageal reflux 07/27/2011  . Obesity, Class III, BMI 40-49.9 (morbid obesity) 07/22/2011  . ADD (attention deficit disorder) 07/22/2011  . ADEM (acute disseminated encephalomyelitis) 07/03/2011  . Splenomegaly 07/03/2011    The patient has a current medication list which includes the following prescription(s): aspirin, atorvastatin, cyclobenzaprine, diclofenac, gabapentin, lisinopril-hydrochlorothiazide, and metformin.  Allergies  Allergen Reactions  . Penicillin G Anaphylaxis    Gets extremely high fevers.  . Sulfa Antibiotics Other (See Comments)    It will kill him  . Amoxicillin   . Cephalosporins Other (See Comments)    Reaction unspecified.  [name] Penicillins Other (See Comments)    fever  . Quinolones Other (See Comments)    Reaction unspecified  . Sulfamethoxazole Swelling    History   Social History  . Marital Status: Single    Spouse Name: N/A  . Number of Children: 0  . Years of Education: college   Occupational History  . disabled     Social History Main Topics  . Smoking status: Former Smoker -- 3.00 packs/day for 3 years  Types: Cigarettes    Quit date: 08/18/1987  . Smokeless tobacco: Never Used  . Alcohol Use: 0.0 oz/week    0 Standard drinks or equivalent per week     Comment: 07/04/11 "glass of wine or spirits once a month"  . Drug Use: No  . Sexual Activity:    Partners: Female   Other Topics Concern  . None   Social History Narrative   Lives in [place] with 2 domestic male partners "his family unit"   Caffeine: minimal    Past Surgical History  Procedure Laterality Date  . Tonsillectomy and adenoidectomy  ~ 1980  . Myringotomy  1979; 1980    bilaterally  . Colonoscopy  2003  . Esophagogastroduodenoscopy  2003  . Esophagogastroduodenoscopy   07/28/2011    Procedure: ESOPHAGOGASTRODUODENOSCOPY (EGD);  Surgeon: Scarlette Shorts, MD;  Location: [facility] ENDOSCOPY;  Service: Endoscopy;  Laterality: N/A;  . Cholecystectomy  08/02/2011    Procedure: LAPAROSCOPIC CHOLECYSTECTOMY;  Surgeon: Harl Bowie, MD;  Location: [facility] OR;  Service: General;;    Family History  Problem Relation Age of Onset  . Coronary artery disease Father   . Stroke Father   . Diabetes Mother   . Cancer Maternal Grandmother     bone  . Cancer Other     Stomach -Great-great grandmother   Review of Systems  Musculoskeletal: Positive for gait problem (normal for him - walks with a cane) and neck pain.  Neurological: Positive for weakness (normal for him) and numbness (normal for him). Negative for headaches.       Objective:   Physical Exam  Constitutional: He is oriented to person, place, and time. He appears well-developed and well-nourished.  BP 153/95 mmHg  Pulse 59  Temp(Src) 98.3 F (36.8 C) (Oral)  Resp 18  Ht 5' 8.5" (1.74 m)  Wt 300 lb (136.079 kg)  BMI 44.95 kg/m2  SpO2 97%   HENT:  Head: Normocephalic and atraumatic.  Right Ear: Hearing, tympanic membrane, external ear and ear canal normal.  Left Ear: Hearing, tympanic membrane, external ear and ear canal normal.  Nose: Nose normal.  Mouth/Throat: Uvula is midline, oropharynx is clear and moist and mucous membranes are normal.  Eyes: Conjunctivae and EOM are normal. Pupils are equal, round, and reactive to light.  Neck: Normal range of motion. Neck supple.  Cardiovascular: Normal rate, regular rhythm and normal heart sounds.   No murmur heard. Pulmonary/Chest: Effort normal and breath sounds normal. He has no wheezes.  Abdominal: Soft. Bowel sounds are normal.  Musculoskeletal:       Cervical back: He exhibits decreased range of motion (2nd to pain), tenderness (over c spine and trapezius muscles), bony tenderness and spasm.       Back:  Pt takes a while to do shoulder ROM but he is able  to do FROM with good strength.  Diabetic Foot Form - Detailed   Diabetic Foot Exam - detailed  Diabetic Foot exam was performed with the following findings:  Yes  12/05/2014  1:29 PM  Visual Foot Exam completed.:  Yes  Is there a history of foot ulcer?:  No  Can the patient see the bottom of their feet?:  Yes  Are the shoes appropriate in style and fit?:  Yes  Is there swelling or and abnormal foot shape?:  No  Are the toenails long?:  No  Are the toenails thick?:  No  Do you have pain in calf while walking?:  No  Is there a  claw toe deformity?:  Yes  Is there elevated skin temparature?:  No  Is there limited skin dorsiflexion?:  No  Is there foot or ankle muscle weakness?:  Yes  Are the toenails ingrown?:  No  Normal Range of Motion:  No    Right posterior Tibialias:  Diminished Left posterior Tibialias:   Diminished  Right Dorsalis Pedis:  Diminished Left Dorsalis Pedis:  Diminished  Semmes-Weinstein Monofilament Test  R Foot Test Control:  Pos L Foot Test Control:  Pos  R Site 1-Great Toe:  Pos L Site 1-Great Toe:  Pos  R Site 4:  Pos L Site 4:  Pos  R Site 5:  Pos L Site 5:  Pos        Neurological: He is alert and oriented to person, place, and time. He has normal strength. He displays normal reflexes. No cranial nerve deficit or sensory deficit.  Skin: Skin is warm and dry.  Psychiatric: He has a normal mood and affect. His behavior is normal. Judgment and thought content normal.   Results for orders placed or performed in visit on 12/05/14  COMPLETE METABOLIC PANEL WITH GFR  Result Value Ref Range   Sodium 141 135 - 145 mEq/L   Potassium 3.7 3.5 - 5.3 mEq/L   Chloride 104 96 - 112 mEq/L   CO2 25 19 - 32 mEq/L   Glucose, Bld 80 70 - 99 mg/dL   BUN 18 6 - 23 mg/dL   Creat 0.82 0.50 - 1.35 mg/dL   Total Bilirubin 0.5 0.2 - 1.2 mg/dL   Alkaline Phosphatase 57 39 - 117 U/L   AST 23 0 - 37 U/L   ALT 50 0 - 53 U/L   Total Protein 6.3 6.0 - 8.3 g/dL   Albumin 4.4 3.5 -  5.2 g/dL   Calcium 8.8 8.4 - 10.5 mg/dL   GFR, Est African American >89 mL/min   GFR, Est Non African American >89 mL/min  Lipid panel  Result Value Ref Range   Cholesterol 128 0 - 200 mg/dL   Triglycerides 254 (H) <150 mg/dL   HDL 29 (L) >=40 mg/dL   Total CHOL/HDL Ratio 4.4 Ratio   VLDL 51 (H) 0 - 40 mg/dL   LDL Cholesterol 48 0 - 99 mg/dL  TSH  Result Value Ref Range   TSH 2.339 0.350 - 4.500 uIU/mL  CBC with Differential/Platelet  Result Value Ref Range   WBC 5.4 4.0 - 10.5 K/uL   RBC 6.05 (H) 4.22 - 5.81 MIL/uL   Hemoglobin 16.7 13.0 - 17.0 g/dL   HCT 49.3 39.0 - 52.0 %   MCV 81.5 78.0 - 100.0 fL   MCH 27.6 26.0 - 34.0 pg   MCHC 33.9 30.0 - 36.0 g/dL   RDW 15.1 11.5 - 15.5 %   Platelets 147 (L) 150 - 400 K/uL   MPV 10.2 8.6 - 12.4 fL   Neutrophils Relative % 53 43 - 77 %   Neutro Abs 2.9 1.7 - 7.7 K/uL   Lymphocytes Relative 36 12 - 46 %   Lymphs Abs 1.9 0.7 - 4.0 K/uL   Monocytes Relative 9 3 - 12 %   Monocytes Absolute 0.5 0.1 - 1.0 K/uL   Eosinophils Relative 2 0 - 5 %   Eosinophils Absolute 0.1 0.0 - 0.7 K/uL   Basophils Relative 0 0 - 1 %   Basophils Absolute 0.0 0.0 - 0.1 K/uL   Smear Review Criteria for review not met  POCT glycosylated hemoglobin (Hb A1C)  Result Value Ref Range   Hemoglobin A1C 5.5        Assessment & Plan:  Annual physical exam - anticipatory guidance - he should continue with healthy lifestyle choices.   ADEM (acute disseminated encephalomyelitis) - f/u with neurologist  Peripheral neuropathy - Plan: gabapentin (NEURONTIN) 300 MG capsule - restart medication - this should help his pain and hopefully improve his mood at the same time. - he will increase the medication weekly and titrate up to his previous dose.  Obesity, Class III, BMI 40-49.9 (morbid obesity) - he should continue to exercise.  We discussed decrease in juice and sweet intake to help with this weight  Benign essential HTN - restart medication - Plan: COMPLETE  METABOLIC PANEL WITH GFR, lisinopril-hydrochlorothiazide (PRINZIDE,ZESTORETIC) 10-12.5 MG per tablet  Elevated cholesterol - restart medications - Plan: Lipid panel, atorvastatin (LIPITOR) 10 MG tablet  Hyperglycemia -  Plan: HM Diabetes Foot Exam, POCT glycosylated hemoglobin (Hb A1C), metFORMIN (GLUCOPHAGE-XR) 500 MG 24 hr tablet  Depressed mood - Plan: TSH - we will restart his gabapentin in hopes for improved mood as it has helped in the past.  We also talked about return some of his focus to himself.  Eating better will help.  I also think helping his pain and muscle spasm in his neck will improve his sleep which will improve his mood - we are going to recheck in a month - he is hesitant to start an antidepressant  Screening for deficiency anemia - Plan: CBC with Differential/Platelet  Neck pain on left side - Plan: diclofenac (VOLTAREN) 75 MG EC tablet  Muscle spasm - Plan: cyclobenzaprine (FLEXERIL) 10 MG tablet - heat and continue exercise but maybe walk in the pool vs swim due to his swimming posture with his neck hyperflexed to keep his face out of the water.  We will recheck in a month due to multiple medical conditions and him being off his medications for a month.  Windell Hummingbird PA-C  [facility] 12/06/2014 5:52 PM

## 2014-12-06 ENCOUNTER — Encounter: Payer: Self-pay | Admitting: Diagnostic Neuroimaging

## 2014-12-06 ENCOUNTER — Ambulatory Visit (INDEPENDENT_AMBULATORY_CARE_PROVIDER_SITE_OTHER): Payer: Managed Care, Other (non HMO) | Admitting: Diagnostic Neuroimaging

## 2014-12-06 VITALS — BP 123/77 | HR 43 | Ht 68.5 in | Wt 305.2 lb

## 2014-12-06 DIAGNOSIS — Z9989 Dependence on other enabling machines and devices: Secondary | ICD-10-CM

## 2014-12-06 DIAGNOSIS — G4733 Obstructive sleep apnea (adult) (pediatric): Secondary | ICD-10-CM

## 2014-12-06 DIAGNOSIS — B89 Unspecified parasitic disease: Secondary | ICD-10-CM

## 2014-12-06 DIAGNOSIS — F909 Attention-deficit hyperactivity disorder, unspecified type: Secondary | ICD-10-CM | POA: Diagnosis not present

## 2014-12-06 DIAGNOSIS — F988 Other specified behavioral and emotional disorders with onset usually occurring in childhood and adolescence: Secondary | ICD-10-CM

## 2014-12-06 DIAGNOSIS — F411 Generalized anxiety disorder: Secondary | ICD-10-CM | POA: Diagnosis not present

## 2014-12-06 DIAGNOSIS — G0401 Postinfectious acute disseminated encephalitis and encephalomyelitis (postinfectious ADEM): Secondary | ICD-10-CM

## 2014-12-06 NOTE — Progress Notes (Signed)
[facility]  PATIENT: Brandon [name] DOB: [birth year]  REFERRING CLINICIAN:  HISTORY FROM: patient and partner REASON FOR VISIT: follow up   HISTORICAL  CHIEF COMPLAINT:  Chief Complaint  Patient presents with  . New Evaluation    hands and nerve issues; word misusage    HISTORY OF PRESENT ILLNESS:   UPDATE 12/05/14: Since last visit, was dx'd with ADEM by Dr. Orie Rout, possibly related to chicken pox at age 44yrs old vs viral infx at age 33 years old. Also had neuropsych testing in [facility] (dx'd with ADHD). Tried nuvigil, ritalin, concerta in past, without benefit. Having trouble focusing on tasks at home. These issues have worsened in the last 6 months.   UPDATE 08/10/12: Lost to follow up due to unpaid balance and some mixup. Unfortunately has not had referral to [facility] for PPMS eval. Since last visit, continue RLE weakness. Int numbness in hands. Anxiety is worse. Sleep apnea is better.  UPDATE 02/06/11: Symptoms stable.  Cyclobenz helping muscle spasms.  Applying for disability.    UPDATE 09/19/10: Still with worsening gait, muscle twitching, and memory issues.  Doing better on CPAP with OSA.    PRIOR HPI (06/20/10): 44 year old male with history of hypertension, insulin resistance, here for evaluation of abnormal MRI of the brain, tremor and gait difficulty. Birth and development: Born at [redacted] weeks gestation; no complications.  Had chicken pox at age 64 years old.  Numerous antibiotic allergic reactions.  Had trouble focusing in school, but was able to well on exams.  ? ADHD. Age 44 yrs developed intermittent tremor (RUE > LUE).  Some intermittent muscle spasms, also erratic jerky movements of arms. Age [age] yrs developed right leg "dragging" and difficulty walking. Symptoms worse in warm weather.  Possibly had difficulty getting out of a hot tub one time.  Saw Dr. Orlin Hilding in 2006.  MRI brain (10/11/07, 05/10/08) showed periventricular and white matter T2 hyperintensities,  suspicious for demyelinating disease.  LP (09/12/03) showed WBC 1, RBC 41, glucose 55, protein 34, VDRL NR, OCB 0, culture neg.   REVIEW OF SYSTEMS: Full 14 system review of systems performed and notable only for fatigue ear discharge shortness of breath light sens heat intolerance painful urination spech diff tremors depression anxiety self injury dizziness neck pain aching muscles.    ALLERGIES: Allergies  Allergen Reactions  . Penicillin G Anaphylaxis    Gets extremely high fevers.  . Sulfa Antibiotics Other (See Comments)    It will kill him  . Amoxicillin   . Cephalosporins Other (See Comments)    Reaction unspecified.  [name] Penicillins Other (See Comments)    fever  . Quinolones Other (See Comments)    Reaction unspecified  . Sulfamethoxazole Swelling    HOME MEDICATIONS: Outpatient Prescriptions Prior to Visit  Medication Sig Dispense Refill  . aspirin 81 MG tablet Take 81 mg by mouth daily.    [name] atorvastatin (LIPITOR) 10 MG tablet Take 1 tablet (10 mg total) by mouth daily. 90 tablet 0  . cyclobenzaprine (FLEXERIL) 10 MG tablet Take 1 tablet (10 mg total) by mouth 3 (three) times daily as needed for muscle spasms. 90 tablet 0  . diclofenac (VOLTAREN) 75 MG EC tablet Take 1 tablet (75 mg total) by mouth 2 (two) times daily. 180 tablet 0  . gabapentin (NEURONTIN) 300 MG capsule Take 1 capsule (300 mg total) by mouth 3 (three) times daily. 270 capsule 1  . lisinopril-hydrochlorothiazide (PRINZIDE,ZESTORETIC) 10-12.5 MG per tablet Take 1 tablet  by mouth daily. 90 tablet 1  . metFORMIN (GLUCOPHAGE-XR) 500 MG 24 hr tablet Take 2 tablets (1,000 mg total) by mouth daily with breakfast. (Patient taking differently: Take 1,000 mg by mouth daily with breakfast. 2 tablets at dinner time as well) 360 tablet 0  . Melatonin 3 MG CAPS Take by mouth.    . Naftifine HCl (NAFTIN) 2 % CREA Apply topically daily.     No facility-administered medications prior to visit.    PAST MEDICAL  HISTORY: Past Medical History  Diagnosis Date  . Hypertension   . MS (multiple sclerosis)   . Pneumonia 2010  . Hyperinsulinemia 07/04/11    "I'm on metformin cause I produce too much insulin"  . Headache(784.0)   . TIA (transient ischemic attack) 2000    denies residual  . Anxiety 2005  . Stroke 2000  . Diabetes mellitus 2012  . TIA (transient ischemic attack)   . Sleep apnea     uses CPAP at home  . Depression 2005  . Mononucleosis, infectious, with hepatitis 06/2011  . Hearing loss   . Nausea     occasional per history form 08/18/11  . Allergy   . Hyperlipidemia   . Neuromuscular disorder     PAST SURGICAL HISTORY: Past Surgical History  Procedure Laterality Date  . Tonsillectomy and adenoidectomy  ~ 1980  . Myringotomy  1979; 1980    bilaterally  . Colonoscopy  2003  . Esophagogastroduodenoscopy  2003  . Esophagogastroduodenoscopy  07/28/2011    Procedure: ESOPHAGOGASTRODUODENOSCOPY (EGD);  Surgeon: [name], MD;  Location: [facility] ENDOSCOPY;  Service: Endoscopy;  Laterality: N/A;  . Cholecystectomy  08/02/2011    Procedure: LAPAROSCOPIC CHOLECYSTECTOMY;  Surgeon: Shelly Rubenstein, MD;  Location: [facility] OR;  Service: General;;    FAMILY HISTORY: Family History  Problem Relation Age of Onset  . Coronary artery disease Father   . Stroke Father   . Diabetes Mother   . Cancer Maternal Grandmother     bone  . Cancer Other     Stomach -Great-great grandmother    SOCIAL HISTORY:  History   Social History  . Marital Status: Single    Spouse Name: N/A  . Number of Children: 0  . Years of Education: college   Occupational History  . disabled     Social History Main Topics  . Smoking status: Former Smoker -- 3.00 packs/day for 3 years    Types: Cigarettes    Quit date: 08/18/1987  . Smokeless tobacco: Never Used  . Alcohol Use: 0.0 oz/week    0 Standard drinks or equivalent per week     Comment: 07/04/11 "glass of wine or spirits once a month"  . Drug Use:  No  . Sexual Activity:    Partners: Female   Other Topics Concern  . Not on file   Social History Narrative   Lives in [place] with 2 domestic male partners "his family unit"   Caffeine: minimal     PHYSICAL EXAM  Filed Vitals:   12/06/14 1300  BP: 123/77  Pulse: 43  Height: 5' 8.5" (1.74 m)  Weight: 305 lb 3.2 oz (138.438 kg)   Wt Readings from Last 3 Encounters:  12/06/14 305 lb 3.2 oz (138.438 kg)  12/05/14 300 lb (136.079 kg)  09/05/14 299 lb (135.626 kg)   Body mass index is 45.73 kg/(m^2).  No exam data present  No flowsheet data found.  GENERAL EXAM: Patient is in no distress; well developed, nourished and groomed;  neck is supple  CARDIOVASCULAR: Regular rate and rhythm, no murmurs, no carotid bruits  NEUROLOGIC: MENTAL STATUS: awake, alert, DECR CONCENTRATION AND ATTENTION; language fluent, comprehension intact, naming intact, fund of knowledge appropriate CRANIAL NERVE: no papilledema on fundoscopic exam, pupils equal and reactive to light, visual fields full to confrontation, extraocular muscles intact, no nystagmus, facial sensation and strength symmetric, hearing intact, palate elevates symmetrically, uvula midline, shoulder shrug symmetric, tongue midline. MOTOR: normal bulk; INTERMITTENT TREMOR IN RUE; BUE (4 PROX, 3 DISTAL); BLE (3 PROX, 4 DISTAL) SENSORY: normal and symmetric to light touch COORDINATION: finger-nose-finger, fine finger movements SLOW  REFLEXES: deep tendon reflexes TRACE and symmetric GAIT/STATION: narrow based gait; LIMPS, USES SINGLE POINT CANE     DIAGNOSTIC DATA (LABS, IMAGING, TESTING) - I reviewed patient records, labs, notes, testing and imaging myself where available.  Lab Results  Component Value Date   WBC 5.4 12/05/2014   HGB 16.7 12/05/2014   HCT 49.3 12/05/2014   MCV 81.5 12/05/2014   PLT 147* 12/05/2014      Component Value Date/Time   NA 141 12/05/2014 1538   K 3.7 12/05/2014 1538   CL 104  12/05/2014 1538   CO2 25 12/05/2014 1538   GLUCOSE 80 12/05/2014 1538   BUN 18 12/05/2014 1538   CREATININE 0.82 12/05/2014 1538   CREATININE 0.92 08/03/2011 0645   CALCIUM 8.8 12/05/2014 1538   PROT 6.3 12/05/2014 1538   ALBUMIN 4.4 12/05/2014 1538   AST 23 12/05/2014 1538   ALT 50 12/05/2014 1538   ALKPHOS 57 12/05/2014 1538   BILITOT 0.5 12/05/2014 1538   GFRNONAA >89 12/05/2014 1538   GFRNONAA >90 08/03/2011 0645   GFRAA >89 12/05/2014 1538   GFRAA >90 08/03/2011 0645   Lab Results  Component Value Date   CHOL 128 12/05/2014   HDL 29* 12/05/2014   LDLCALC 48 12/05/2014   TRIG 254* 12/05/2014   CHOLHDL 4.4 12/05/2014   Lab Results  Component Value Date   HGBA1C 5.5 12/05/2014   No results found for: QASTMHDQ22 Lab Results  Component Value Date   TSH 2.339 12/05/2014    07/02/10 MRI brain (with and without contrast) demonstrating: 1. Several periventricular and peri-atrial foci of T2 hyperintensity.  Considerations include autoimmune, inflammatory, demyelinating, post-infectious or microvascular ischemic etiologies. 2. No abnormal enhancing lesions.  07/02/10 MRI cervical spine (with and without contrast) demonstrating: 1. At C5-6 there is mild-moderate spinal stenosis due to posterior central disc protrusion which deforms the anterior margin of the spinal cord. 2. At C4-5 there is moderate right foraminal stenosis and mild spinal stenosis due to disc bulging, right uncovertebral joint hypertrophy. 3. Hazy T2 signal within the spinal cord is likely artifactual.   4. No abnormal lesion on post-contrast views.    07/02/10 MRI thoracic spine (with and without contrast) - minimal scoliosis convex to the right.  No intrinsic spinal cord lesions or enhancing lesions.  07/21/10 visual evoked potentials - normal     ASSESSMENT AND PLAN  44 y.o. year old male here with ADEM, and sequelae of cognitive changes, anxiety, tremor, pain. Has intermittent ulnar nerve pain  symptoms (now resolved). Worsening cognitive problems in last 6 months.   Dx:   ADEM (acute disseminated encephalomyelitis) (postinfectious)  Attention deficit disorder  Anxiety state  OSA on CPAP   PLAN: - MRI brain (with and without) to evaluate worsening cognitive function - referral to psychiatry/psychology for ADD and anxiety evaluation - importance of physical activity, fitness, organization techniques reviewed  Orders Placed This Encounter  Procedures  . MR Brain W Wo Contrast  . Ambulatory referral to Psychiatry   Return in about 6 months (around 06/07/2015).  I spent 25 minutes of face to face time with patient. Greater than 50% of time was spent in counseling and coordination of care with patient.      Suanne Marker, MD 12/06/2014, 1:32 PM Certified in Neurology, Neurophysiology and Neuroimaging  [facility] [address], [address] [place], [place] [address] [phone]

## 2014-12-06 NOTE — Patient Instructions (Signed)
I will check MRI brain and refer you to psychiatry.

## 2014-12-21 ENCOUNTER — Inpatient Hospital Stay: Admission: RE | Admit: 2014-12-21 | Payer: Managed Care, Other (non HMO) | Source: Ambulatory Visit

## 2015-01-08 ENCOUNTER — Ambulatory Visit
Admission: RE | Admit: 2015-01-08 | Discharge: 2015-01-08 | Disposition: A | Discharge status: Home / Self Care | Payer: Managed Care, Other (non HMO) | Source: Ambulatory Visit | Attending: Diagnostic Neuroimaging | Admitting: Diagnostic Neuroimaging

## 2015-01-08 DIAGNOSIS — G4733 Obstructive sleep apnea (adult) (pediatric): Secondary | ICD-10-CM

## 2015-01-08 DIAGNOSIS — B89 Unspecified parasitic disease: Secondary | ICD-10-CM | POA: Diagnosis not present

## 2015-01-08 DIAGNOSIS — Z9989 Dependence on other enabling machines and devices: Secondary | ICD-10-CM

## 2015-01-08 DIAGNOSIS — G0401 Postinfectious acute disseminated encephalitis and encephalomyelitis (postinfectious ADEM): Secondary | ICD-10-CM

## 2015-01-08 DIAGNOSIS — F411 Generalized anxiety disorder: Secondary | ICD-10-CM

## 2015-01-08 DIAGNOSIS — F988 Other specified behavioral and emotional disorders with onset usually occurring in childhood and adolescence: Secondary | ICD-10-CM

## 2015-01-08 MED ORDER — GADOBENATE DIMEGLUMINE 529 MG/ML IV SOLN
20.0000 mL | Freq: Once | INTRAVENOUS | Status: AC | PRN
  Administered 2015-01-08: 20 mL via INTRAVENOUS

## 2015-01-09 ENCOUNTER — Encounter: Payer: Self-pay | Admitting: Physician Assistant

## 2015-01-09 ENCOUNTER — Ambulatory Visit (INDEPENDENT_AMBULATORY_CARE_PROVIDER_SITE_OTHER): Payer: Managed Care, Other (non HMO) | Admitting: Internal Medicine

## 2015-01-09 ENCOUNTER — Ambulatory Visit (INDEPENDENT_AMBULATORY_CARE_PROVIDER_SITE_OTHER): Payer: Managed Care, Other (non HMO)

## 2015-01-09 VITALS — BP 148/92 | HR 68 | Temp 98.2°F | Resp 16 | Ht 69.0 in | Wt 299.2 lb

## 2015-01-09 DIAGNOSIS — M542 Cervicalgia: Secondary | ICD-10-CM | POA: Diagnosis not present

## 2015-01-09 DIAGNOSIS — F329 Major depressive disorder, single episode, unspecified: Secondary | ICD-10-CM | POA: Diagnosis not present

## 2015-01-09 DIAGNOSIS — R4589 Other symptoms and signs involving emotional state: Secondary | ICD-10-CM

## 2015-01-09 DIAGNOSIS — G629 Polyneuropathy, unspecified: Secondary | ICD-10-CM

## 2015-01-09 DIAGNOSIS — M62838 Other muscle spasm: Secondary | ICD-10-CM | POA: Diagnosis not present

## 2015-01-09 MED ORDER — CYCLOBENZAPRINE HCL ER 30 MG PO CP24: 30.0000 mg | ORAL_CAPSULE | Freq: Every day | ORAL | Status: DC | PRN

## 2015-01-09 NOTE — Progress Notes (Signed)
Brandon [name]  MRN: [id number] DOB: 1971/01/01  Subjective:  Pt presents to clinic for a recheck of left side.  He was taking the flexeril 4x/day and that seemed to help with the voltaren tid but due to his increased use of the medication his medication ran out last week and he felt like the pain was improving but when he ran out of his medication the pain increased.  The pain this am was the worse and it felt like his shoulder was almost separating - the pain is current better.  He has no pain radiation into his fingers on the left side and no change in his weakness and no paresthesias.  His mood is better - one of his partners is with him today and agrees that his mood is improved and he is irritable due to his pain but he is better than he has been in the past.  Over the last month they lost a loved family member and that was hard but he feels like he dealt with it better than he would have in the past.    Now just walking in the pool and he is no longer swimming and he feels like that is helping with his pain.  He is taking his medication most days - he does not want to do lab work today because he has not been as good with his medications as he knows he needs to be.  He has been taking the gabapentin and he feels like being back on that has been a good thing.  Patient Active Problem List   Diagnosis Date Noted  . Benign essential HTN 12/05/2014  . Elevated cholesterol 12/05/2014  . Macroglossia 09/05/2014  . Severe obesity (BMI >= 40) 09/05/2014  . ADEM (acute disseminated encephalomyelitis) (postinfectious) 09/05/2014  . Anxiety state, unspecified 12/11/2013  . Motor tic disorder 12/11/2013  . OSA on CPAP 10/04/2013  . Hypersomnia with sleep apnea, unspecified 10/04/2013  . High frequency hearing loss 08/24/2013  . Peripheral neuropathy 04/19/2013  . Esophageal reflux 07/27/2011  . Obesity, Class III, BMI 40-49.9 (morbid obesity) 07/22/2011  . ADD (attention deficit disorder)  07/22/2011  . ADEM (acute disseminated encephalomyelitis) 07/03/2011  . Splenomegaly 07/03/2011    Current Outpatient Prescriptions on File Prior to Visit  Medication Sig Dispense Refill  . aspirin 81 MG tablet Take 81 mg by mouth daily.    [name] atorvastatin (LIPITOR) 10 MG tablet Take 1 tablet (10 mg total) by mouth daily. 90 tablet 0  . cyclobenzaprine (FLEXERIL) 10 MG tablet Take 1 tablet (10 mg total) by mouth 3 (three) times daily as needed for muscle spasms. 90 tablet 0  . diclofenac (VOLTAREN) 75 MG EC tablet Take 1 tablet (75 mg total) by mouth 2 (two) times daily. 180 tablet 0  . gabapentin (NEURONTIN) 300 MG capsule Take 1 capsule (300 mg total) by mouth 3 (three) times daily. 270 capsule 1  . lisinopril-hydrochlorothiazide (PRINZIDE,ZESTORETIC) 10-12.5 MG per tablet Take 1 tablet by mouth daily. 90 tablet 1  . metFORMIN (GLUCOPHAGE-XR) 500 MG 24 hr tablet Take 2 tablets (1,000 mg total) by mouth daily with breakfast. (Patient taking differently: Take 1,000 mg by mouth daily with breakfast. 2 tablets at dinner time as well) 360 tablet 0   No current facility-administered medications on file prior to visit.    Allergies  Allergen Reactions  . Penicillin G Anaphylaxis    Gets extremely high fevers.  . Sulfa Antibiotics Other (See Comments)  It will kill him  . Amoxicillin   . Cephalosporins Other (See Comments)    Reaction unspecified.  [name] Penicillins Other (See Comments)    fever  . Quinolones Other (See Comments)    Reaction unspecified  . Sulfamethoxazole Swelling    Review of Systems  Musculoskeletal: Positive for neck pain. Negative for neck stiffness.   Objective:  BP 148/92 mmHg  Pulse 68  Temp(Src) 98.2 F (36.8 C) (Oral)  Resp 16  Ht  (1.753 m)  Wt 299 lb 3.2 oz (135.716 kg)  BMI 44.16 kg/m2  SpO2 96%  Physical Exam  Constitutional: He is oriented to person, place, and time and well-developed, well-nourished, and in no distress.  HENT:  Head:  Normocephalic and atraumatic.  Right Ear: External ear normal.  Left Ear: External ear normal.  Eyes: Conjunctivae are normal.  Neck: Normal range of motion.  Pulmonary/Chest: Effort normal.  Musculoskeletal:       Right shoulder: Normal.       Left shoulder: He exhibits decreased range of motion (lacks a small amount of abduction but considerable more ROM this visit compared with his visit 1 month ago - he is able to just raise his arm - he does sitll have pain with the ROM of his shoulder) and spasm (trapezius - significantly improved from his last visit 1 month ago). He exhibits no tenderness and no bony tenderness.  Neurological: He is alert and oriented to person, place, and time. He has normal motor skills and normal strength. Gait normal.  Reflex Scores:      Bicep reflexes are 2+ on the right side and 2+ on the left side.      Brachioradialis reflexes are 2+ on the right side and 2+ on the left side. Skin: Skin is warm and dry.  Psychiatric: Mood, memory, affect and judgment normal.   [facility] reading (PRIMARY) by  Dr. Merla Riches.  Decreased lordosis and degenerative changes C5-C6.  Assessment and Plan :  Cervical spine pain - Plan: DG Cervical Spine 2 or 3 views  Peripheral neuropathy - improved on the gabapentin - he will continue  Depressed mood - improved - he is not interested in starting an SSRI  Muscle spasm - Plan: cyclobenzaprine (AMRIX) 30 MG 24 hr capsule, Ambulatory referral to Physical Therapy - we will try Amrix to see if he gets more relief - he will start PT as I think that will help with the spasm and the mobility of his neck.  Neck pain on left side - Plan: Ambulatory referral to Physical Therapy  Recheck in 2 months - sooner if there is a problem  Benny Lennert PA-C  [facility] 01/09/2015 5:27 PM I have participated in the care of this patient with the Advanced Practice Provider and agree with Diagnosis and Plan as  documented. Robert P. Merla Riches, M.D.

## 2015-01-09 NOTE — Patient Instructions (Signed)
Lets do PT for your neck. It is ok to try OTC Nexium for your heartburn and it is ok to add zantac See you in 2 months for your recheck with labs.  Lets try Amrix for your muscle relaxer and if it helps lets me know.

## 2015-01-16 ENCOUNTER — Ambulatory Visit: Payer: Managed Care, Other (non HMO) | Admitting: Physician Assistant

## 2015-01-18 ENCOUNTER — Telehealth: Payer: Self-pay

## 2015-01-18 NOTE — Telephone Encounter (Signed)
Pt has medication questions and possible side effects pt has taken meds 12   Hours appt

## 2015-01-18 NOTE — Telephone Encounter (Signed)
Spoke to pt. He was concerned because he accidentally took 2 pills of Amarix within 12 hours instead of 24 hours. I told him to not take anymore today and he should be fine. If he notices any odd symptoms he can give [place] a call or come to the office, but tried to reassure him that he will be fine.  This information correct?

## 2015-01-18 NOTE — Telephone Encounter (Signed)
That should be fine.  Most of the sedation from this medication is within 6 hours of taking it he should not have extra sedation due to the 12h separation.

## 2015-01-30 ENCOUNTER — Telehealth: Payer: Self-pay

## 2015-01-30 NOTE — Telephone Encounter (Signed)
Pt states he had to double up on his anti-inflammatory drug, and muscle relaxant and adding tylenol along with that,is in Severe back pain and needs either stronger meds(ins will not pay for the previous because too soon for refills)  Please advise   Best phone [phone]   [facility] [address]

## 2015-01-30 NOTE — Telephone Encounter (Signed)
Please advise. Should pt come back in?

## 2015-01-30 NOTE — Telephone Encounter (Signed)
Yes he needs to return. Has he started the PT that is mentioned in his last note? It is best for him to come in if we are going to start a stronger pain medication.

## 2015-01-31 NOTE — Telephone Encounter (Signed)
Pt has started PT but he thinks it's aggravating everything. He said he's going to try it a little longer with the meds and then RTC if needed. He has an appt with [name] on 9/8. He is going to try to hold out until then.

## 2015-02-05 ENCOUNTER — Other Ambulatory Visit: Payer: Self-pay | Admitting: Physician Assistant

## 2015-02-07 NOTE — Telephone Encounter (Signed)
[name], at July OV I see that you put pt on Amrix, so I OKd RFs of that until next appt, but wasn't sure if you still want pt taking reg cyclobenzaprine also sometimes, or if you DCd it. Didn't see any notes telling pt to take Amrix sometimes, and cyclobenzaprine other times, but also didn't see that you told him to stop it when he started Amrix. Both were still on current med list on AVS at end of OV.

## 2015-02-09 NOTE — Telephone Encounter (Signed)
Please call the patient and ask him if the Amrix or the Flexeril worked better.  I only want him to be taking the Flexeril tid.

## 2015-02-10 ENCOUNTER — Ambulatory Visit (INDEPENDENT_AMBULATORY_CARE_PROVIDER_SITE_OTHER): Payer: Managed Care, Other (non HMO) | Admitting: Family Medicine

## 2015-02-10 VITALS — BP 160/100 | HR 86 | Temp 98.2°F | Resp 18 | Ht 67.5 in | Wt 299.1 lb

## 2015-02-10 DIAGNOSIS — H60392 Other infective otitis externa, left ear: Secondary | ICD-10-CM | POA: Diagnosis not present

## 2015-02-10 MED ORDER — OFLOXACIN 0.3 % OP SOLN: OPHTHALMIC | Status: DC

## 2015-02-10 MED ORDER — DOXYCYCLINE HYCLATE 100 MG PO TABS: 100.0000 mg | ORAL_TABLET | Freq: Two times a day (BID) | ORAL | Status: DC

## 2015-02-10 MED ORDER — HYDROCODONE-ACETAMINOPHEN 5-325 MG PO TABS: 1.0000 | ORAL_TABLET | Freq: Four times a day (QID) | ORAL | Status: DC | PRN

## 2015-02-10 NOTE — Patient Instructions (Addendum)
Take doxycycline 1 pill twice daily for oral anabiotic. (This can photosensitize you which means you can burn easier in the sun)  Take the ofloxacin eardrops 5 drops twice daily in left ear as directed. The prescription will say eyedrops, but there to be used in your ear. Since you are listed as possibly allergic to quinolone's discontinue these immediately if you're getting rash or inflammation or irritation increasing around the ear. If he is getting worse will need to refer you to an ENT doctor.  Take the hydrocodone/APAP one every 4-6 hours if needed for severe pain only  Return as necessary

## 2015-02-10 NOTE — Progress Notes (Signed)
  Subjective:  Patient ID: Brandon [name], male    DOB: [birth year]  Age: 44 y.o. MRN: [id number]  44 year old man whose been having pain in his left ear for the last couple of days. He did go swimming about 4 days ago. He does not have a history of a lot of ear problems.  He has a history of allergy or sensitivity to a long list of antibiotics. He does not know the specific problem he had with quinolone's, nor is it listed in the chart.  Objective:   Right TM is normal. Left TM not visible due to the swelling of the canal. There is a little pus in the canal. His neck is supple but he is tender in the angle of the jaw and has pain on motion of the left ear. Throat is clear.  Assessment & Plan:   Assessment:  Left otitis externa Multiple anabiotic allergies  Decided to go with ofloxacin drops in the ear even though slight risk of an allergy to this. Will treat with oral antibiotics also since cannot see the drum itself on the left.  Plan:  As noted above  Patient Instructions  Take doxycycline 1 pill twice daily for oral anabiotic. (This can photosensitize you which means you can burn easier in the sun)  Take the ofloxacin eardrops 5 drops twice daily in left ear as directed. The prescription will say eyedrops, but there to be used in your ear. Since you are listed as possibly allergic to quinolone's discontinue these immediately if you're getting rash or inflammation or irritation increasing around the ear. If he is getting worse will need to refer you to an ENT doctor.  Take the hydrocodone/APAP one every 4-6 hours if needed for severe pain only  Return as necessary     Seira Cody, MD 02/10/2015

## 2015-02-15 ENCOUNTER — Other Ambulatory Visit: Payer: Self-pay | Admitting: Physician Assistant

## 2015-03-02 ENCOUNTER — Other Ambulatory Visit: Payer: Self-pay | Admitting: Physician Assistant

## 2015-03-14 ENCOUNTER — Encounter: Payer: Self-pay | Admitting: Physician Assistant

## 2015-03-14 ENCOUNTER — Ambulatory Visit (INDEPENDENT_AMBULATORY_CARE_PROVIDER_SITE_OTHER): Payer: Managed Care, Other (non HMO) | Admitting: Physician Assistant

## 2015-03-14 VITALS — BP 134/100 | HR 87 | Temp 98.6°F | Resp 16 | Ht 68.0 in | Wt 289.2 lb

## 2015-03-14 DIAGNOSIS — I10 Essential (primary) hypertension: Secondary | ICD-10-CM

## 2015-03-14 DIAGNOSIS — E7800 Pure hypercholesterolemia, unspecified: Secondary | ICD-10-CM

## 2015-03-14 NOTE — Progress Notes (Signed)
Brandon [name]  MRN: [id number] DOB: 1971/01/19  Subjective:  Pt presents to clinic for a recheck.  He feels like the amrix really helps with his problems.  He has finished the PT for his neck and he is much better.  He still is trying to swim 2x/week but he is swimming on his back and therefore not hurting his neck when he swims.  He is trying to remember to take his medications but he is not having good luck - he tried a month medication box but then he would forget to refill the boxes.  He has not been taking his gabapentin even though he knows it helps with him pain.  He does not check his BP at home.  Patient Active Problem List   Diagnosis Date Noted  . Benign essential HTN 12/05/2014  . Elevated cholesterol 12/05/2014  . Macroglossia 09/05/2014  . Severe obesity (BMI >= 40) 09/05/2014  . ADEM (acute disseminated encephalomyelitis) (postinfectious) 09/05/2014  . Anxiety state, unspecified 12/11/2013  . Motor tic disorder 12/11/2013  . OSA on CPAP 10/04/2013  . Hypersomnia with sleep apnea, unspecified 10/04/2013  . High frequency hearing loss 08/24/2013  . Peripheral neuropathy 04/19/2013  . Esophageal reflux 07/27/2011  . Obesity, Class III, BMI 40-49.9 (morbid obesity) 07/22/2011  . ADD (attention deficit disorder) 07/22/2011  . ADEM (acute disseminated encephalomyelitis) 07/03/2011  . Splenomegaly 07/03/2011    Current Outpatient Prescriptions on File Prior to Visit  Medication Sig Dispense Refill  . AMRIX 30 MG 24 hr capsule TAKE ONE CAPSULE BY MOUTH EVERY DAY AS NEEDED 30 capsule 1  . aspirin 81 MG tablet Take 81 mg by mouth daily.    [name] atorvastatin (LIPITOR) 10 MG tablet TAKE 1 TABLET (10 MG TOTAL) BY MOUTH DAILY 90 tablet 0  . diclofenac (VOLTAREN) 75 MG EC tablet TAKE 1 TABLET (75 MG TOTAL) BY MOUTH 2 (TWO) TIMES DAILY. 180 tablet 1  . gabapentin (NEURONTIN) 300 MG capsule Take 1 capsule (300 mg total) by mouth 3 (three) times daily. 270 capsule 1  .  lisinopril-hydrochlorothiazide (PRINZIDE,ZESTORETIC) 10-12.5 MG per tablet Take 1 tablet by mouth daily. 90 tablet 1  . metFORMIN (GLUCOPHAGE-XR) 500 MG 24 hr tablet TAKE 2 TABLETS (1,000 MG TOTAL) BY MOUTH DAILY WITH BREAKFAST 360 tablet 0  . cloNIDine (CATAPRES) 0.1 MG tablet INCREASE AS TOLERATED TO 1 TABLET BY MOUTH 3 TIMES A DAY     No current facility-administered medications on file prior to visit.    Allergies  Allergen Reactions  . Penicillin G Anaphylaxis    Gets extremely high fevers.  . Sulfa Antibiotics Other (See Comments)    It will kill him  . Amoxicillin   . Cephalosporins Other (See Comments)    Reaction unspecified.  [name] Penicillins Other (See Comments)    fever  . Quinolones Other (See Comments)    Reaction unspecified  . Sulfamethoxazole Swelling    Review of Systems  Musculoskeletal: Positive for gait problem (walks with pain) and neck pain (resolved).  Neurological: Positive for headaches.   Objective:  BP 134/100 mmHg  Pulse 87  Temp(Src) 98.6 F (37 C) (Oral)  Resp 16  Ht 5\' 8"  (1.727 m)  Wt 289 lb 3.2 oz (131.18 kg)  BMI 43.98 kg/m2  SpO2 96%  Physical Exam  Constitutional: He is oriented to person, place, and time and well-developed, well-nourished, and in no distress.  HENT:  Head: Normocephalic and atraumatic.  Right Ear: Hearing, tympanic membrane, external ear and  ear canal normal.  Left Ear: Hearing, tympanic membrane, external ear and ear canal normal.  Eyes: Conjunctivae are normal.  Neck: Normal range of motion.  Cardiovascular: Normal rate, regular rhythm and normal heart sounds.   No murmur heard. Pulmonary/Chest: Effort normal and breath sounds normal. He has no wheezes.  Musculoskeletal:       Right shoulder: Normal.       Left shoulder: Normal.       Cervical back: He exhibits normal range of motion and no spasm.  Neurological: He is alert and oriented to person, place, and time. Gait normal.  Skin: Skin is warm and dry.    Psychiatric: Mood, memory, affect and judgment normal.  Seems forgetful today more so than normal    Assessment and Plan :  Benign essential HTN - pt to try and keep track of his BP esp when he has headaches - his diastolic BP is elevated today but I am worried about increasing his medication because his BP fluctuates so much and I do not want his systolic BP much lower than it is due to his chronic instability and cane walking due to peripheral neuropathy.  Elevated cholesterol - Plan: COMPLETE METABOLIC PANEL WITH GFR, Lipid panel   D/w pt to continue to swimming that is great for him.  We discussed ways to help him remember to take his medication daily and the importance of medical compliance. His memory definitely affects this but there is also a factor of him not wanting to be on medications that do not make him feel better.  He worries more about others than about himself.  Benny Lennert PA-C  [facility] 03/14/2015 4:57 PM

## 2015-03-14 NOTE — Patient Instructions (Signed)
Really try hard and take all your medications daily.  Recheck with me in a month and monitor your headaches and BP

## 2015-03-15 LAB — COMPLETE METABOLIC PANEL WITH GFR
ALT: 115 U/L — ABNORMAL HIGH (ref 9–46)
AST: 46 U/L — ABNORMAL HIGH (ref 10–40)
Albumin: 4.5 g/dL (ref 3.6–5.1)
Alkaline Phosphatase: 72 U/L (ref 40–115)
BUN: 13 mg/dL (ref 7–25)
CO2: 25 mmol/L (ref 20–31)
Calcium: 9.7 mg/dL (ref 8.6–10.3)
Chloride: 102 mmol/L (ref 98–110)
Creat: 0.91 mg/dL (ref 0.60–1.35)
GFR, Est African American: >89 mL/min (ref >=60)
GFR, Est Non African American: >89 mL/min (ref >=60)
Glucose, Bld: 93 mg/dL (ref 65–99)
Potassium: 3.9 mmol/L (ref 3.5–5.3)
Sodium: 139 mmol/L (ref 135–146)
Total Bilirubin: 0.7 mg/dL (ref 0.2–1.2)
Total Protein: 6.9 g/dL (ref 6.1–8.1)

## 2015-03-15 LAB — LIPID PANEL
Cholesterol: 146 mg/dL (ref 125–200)
HDL: 28 mg/dL — ABNORMAL LOW (ref >=40)
LDL Cholesterol: 50 mg/dL (ref <130)
Total CHOL/HDL Ratio: 5.2 Ratio — ABNORMAL HIGH (ref <=5.0)
Triglycerides: 338 mg/dL — ABNORMAL HIGH (ref <150)
VLDL: 68 mg/dL — ABNORMAL HIGH (ref <30)

## 2015-04-11 ENCOUNTER — Ambulatory Visit (INDEPENDENT_AMBULATORY_CARE_PROVIDER_SITE_OTHER): Payer: Managed Care, Other (non HMO) | Admitting: Physician Assistant

## 2015-04-11 ENCOUNTER — Encounter: Payer: Self-pay | Admitting: Physician Assistant

## 2015-04-11 VITALS — BP 120/84 | HR 88 | Temp 98.2°F | Resp 16 | Ht 68.25 in | Wt 291.6 lb

## 2015-04-11 DIAGNOSIS — I10 Essential (primary) hypertension: Secondary | ICD-10-CM | POA: Diagnosis not present

## 2015-04-11 DIAGNOSIS — K219 Gastro-esophageal reflux disease without esophagitis: Secondary | ICD-10-CM

## 2015-04-11 DIAGNOSIS — E7800 Pure hypercholesterolemia, unspecified: Secondary | ICD-10-CM | POA: Diagnosis not present

## 2015-04-11 DIAGNOSIS — R748 Abnormal levels of other serum enzymes: Secondary | ICD-10-CM

## 2015-04-11 DIAGNOSIS — Z9989 Dependence on other enabling machines and devices: Secondary | ICD-10-CM

## 2015-04-11 DIAGNOSIS — G63 Polyneuropathy in diseases classified elsewhere: Secondary | ICD-10-CM

## 2015-04-11 DIAGNOSIS — Z23 Encounter for immunization: Secondary | ICD-10-CM

## 2015-04-11 DIAGNOSIS — G0400 Acute disseminated encephalitis and encephalomyelitis, unspecified: Secondary | ICD-10-CM

## 2015-04-11 DIAGNOSIS — G4733 Obstructive sleep apnea (adult) (pediatric): Secondary | ICD-10-CM

## 2015-04-11 LAB — HEPATIC FUNCTION PANEL
ALT: 99 U/L — ABNORMAL HIGH (ref 9–46)
AST: 36 U/L (ref 10–40)
Albumin: 4.3 g/dL (ref 3.6–5.1)
Alkaline Phosphatase: 62 U/L (ref 40–115)
Bilirubin, Direct: 0.1 mg/dL (ref <=0.2)
Indirect Bilirubin: 0.6 mg/dL (ref 0.2–1.2)
Total Bilirubin: 0.7 mg/dL (ref 0.2–1.2)
Total Protein: 6.7 g/dL (ref 6.1–8.1)

## 2015-04-11 NOTE — Patient Instructions (Addendum)
Try melatonin for sleep Try a sleep routine to help get to sleep  Continue CPAP  Increase the gabapentin to 3x/day

## 2015-04-11 NOTE — Progress Notes (Signed)
Brandon [name]  MRN: [id number] DOB: 1970-11-25  Subjective:  Pt presents to clinic for a recheck.  He overall is doing ok.  He has started back on his cholesterol medications and he is tolerating them ok.  He has forgotten to take his metformin a lot of days.  He is trying to remember his neurontin.  He states he just has so much going on and he has been having increase fatigue and sleep problems lately.  Shoulder pain - resolved - still taking the amrix - very happy with the results.  Recently increased belching burping - he thinks that it was related to foods he eats but he has not been able to figure out what the trigger is - he has been good for the last 4 days - He does not routinely take anything for her reflux.  Bleeding from stool - had a episode of constipation - hard and strained - then had bleeding that has gone away - he plans to keep watch  Improved mood - terrible fatigue - not sleeping well - feels relaxed but cannot sleep until he passes out - does not feel like it is anxiety related - uses CPAP daily - goes to bed around 4a- 6a and 8a-2p is normal - currently less sleep because trouble falling asleep - he states he has to many chores to go to bed early - sleep has always been a problem for him - he has used melatonin in the past and that has helped but he has recently not tried anything  Swimming about 1-2 days a week - trying to get to 3x/week - feels like that is helping but does not always do it  Patient Active Problem List   Diagnosis Date Noted  . Benign essential HTN 12/05/2014  . Elevated cholesterol 12/05/2014  . Macroglossia 09/05/2014  . Severe obesity (BMI >= 40) (HCC) 09/05/2014  . ADEM (acute disseminated encephalomyelitis) (postinfectious) 09/05/2014  . Anxiety state, unspecified 12/11/2013  . Motor tic disorder 12/11/2013  . OSA on CPAP 10/04/2013  . Hypersomnia with sleep apnea, unspecified 10/04/2013  . High frequency hearing loss 08/24/2013  .  Peripheral neuropathy (HCC) 04/19/2013  . Esophageal reflux 07/27/2011  . Obesity, Class III, BMI 40-49.9 (morbid obesity) (HCC) 07/22/2011  . ADD (attention deficit disorder) 07/22/2011  . ADEM (acute disseminated encephalomyelitis) 07/03/2011  . Splenomegaly 07/03/2011    Current Outpatient Prescriptions on File Prior to Visit  Medication Sig Dispense Refill  . AMRIX 30 MG 24 hr capsule TAKE ONE CAPSULE BY MOUTH EVERY DAY AS NEEDED 30 capsule 1  . aspirin 81 MG tablet Take 81 mg by mouth daily.    [name] atorvastatin (LIPITOR) 10 MG tablet TAKE 1 TABLET (10 MG TOTAL) BY MOUTH DAILY 90 tablet 0  . cloNIDine (CATAPRES) 0.1 MG tablet INCREASE AS TOLERATED TO 1 TABLET BY MOUTH 3 TIMES A DAY    . diclofenac (VOLTAREN) 75 MG EC tablet TAKE 1 TABLET (75 MG TOTAL) BY MOUTH 2 (TWO) TIMES DAILY. 180 tablet 1  . gabapentin (NEURONTIN) 300 MG capsule Take 1 capsule (300 mg total) by mouth 3 (three) times daily. 270 capsule 1  . lisinopril-hydrochlorothiazide (PRINZIDE,ZESTORETIC) 10-12.5 MG per tablet Take 1 tablet by mouth daily. 90 tablet 1  . metFORMIN (GLUCOPHAGE-XR) 500 MG 24 hr tablet TAKE 2 TABLETS (1,000 MG TOTAL) BY MOUTH DAILY WITH BREAKFAST 360 tablet 0   No current facility-administered medications on file prior to visit.    Allergies  Allergen  Reactions  . Penicillin G Anaphylaxis    Gets extremely high fevers.  . Sulfa Antibiotics Other (See Comments)    It will kill him  . Amoxicillin   . Cephalosporins Other (See Comments)    Reaction unspecified.  [name] Penicillins Other (See Comments)    fever  . Quinolones Other (See Comments)    Reaction unspecified  . Sulfamethoxazole Swelling    Review of Systems Objective:  BP 120/84 mmHg  Pulse 88  Temp(Src) 98.2 F (36.8 C) (Oral)  Resp 16  Ht 5' 8.25" (1.734 m)  Wt 291 lb 9.6 oz (132.269 kg)  BMI 43.99 kg/m2  SpO2 98%  Physical Exam  Constitutional: He is oriented to person, place, and time and well-developed, well-nourished,  and in no distress.  HENT:  Head: Normocephalic and atraumatic.  Right Ear: External ear normal.  Left Ear: External ear normal.  Eyes: Conjunctivae are normal.  Neck: Normal range of motion.  Cardiovascular: Normal rate, regular rhythm and normal heart sounds.   No murmur heard. Pulmonary/Chest: Effort normal.  Neurological: He is alert and oriented to person, place, and time.  Walks with a cane - normal for him  Skin: Skin is warm and dry.  Psychiatric: Mood, memory, affect and judgment normal.    Assessment and Plan :  Abnormal liver enzymes - Plan: Hepatic Function Panel - we will recheck - he needs to be on a statin to increase his HDL but if his LFTs are more elevated we will have to stop this -  Flu vaccine need - Plan: Flu Vaccine QUAD 36+ mos IM  Benign essential HTN - controlled today  OSA on CPAP - uses his CPAP every night  Sleep problems - we discussed good sleep hygiene - good sleep habits around bedtime - he will start melatonin again - we talked about gabapentic at bedtime can also help with his sleep  Gastroesophageal reflux disease without esophagitis - expect that to be the cause of his recent gastritis like symptoms - they have currently resolved but he will monitor in the future  ADEM (acute disseminated encephalomyelitis)   Polyneuropathy associated with underlying disease (HCC)  Elevated cholesterol  Benny Lennert PA-C  [facility] 04/12/2015 11:36 AM

## 2015-04-21 ENCOUNTER — Other Ambulatory Visit: Payer: Self-pay | Admitting: Physician Assistant

## 2015-05-21 ENCOUNTER — Other Ambulatory Visit: Payer: Self-pay | Admitting: Physician Assistant

## 2015-06-11 ENCOUNTER — Other Ambulatory Visit: Payer: Self-pay | Admitting: Physician Assistant

## 2015-06-17 ENCOUNTER — Ambulatory Visit: Payer: Managed Care, Other (non HMO) | Admitting: Diagnostic Neuroimaging

## 2015-06-17 ENCOUNTER — Encounter: Payer: Self-pay | Admitting: Diagnostic Neuroimaging

## 2015-06-27 ENCOUNTER — Other Ambulatory Visit: Payer: Self-pay | Admitting: Physician Assistant

## 2015-07-17 ENCOUNTER — Ambulatory Visit: Payer: Managed Care, Other (non HMO) | Admitting: Physician Assistant

## 2015-07-18 ENCOUNTER — Ambulatory Visit (INDEPENDENT_AMBULATORY_CARE_PROVIDER_SITE_OTHER): Payer: Managed Care, Other (non HMO) | Admitting: Physician Assistant

## 2015-07-18 ENCOUNTER — Encounter: Payer: Self-pay | Admitting: Physician Assistant

## 2015-07-18 VITALS — BP 122/80 | HR 96 | Temp 99.0°F | Resp 16 | Ht 68.0 in | Wt 299.2 lb

## 2015-07-18 DIAGNOSIS — G63 Polyneuropathy in diseases classified elsewhere: Secondary | ICD-10-CM | POA: Diagnosis not present

## 2015-07-18 DIAGNOSIS — J3489 Other specified disorders of nose and nasal sinuses: Secondary | ICD-10-CM | POA: Diagnosis not present

## 2015-07-18 DIAGNOSIS — R7989 Other specified abnormal findings of blood chemistry: Secondary | ICD-10-CM

## 2015-07-18 DIAGNOSIS — R945 Abnormal results of liver function studies: Secondary | ICD-10-CM

## 2015-07-18 DIAGNOSIS — I10 Essential (primary) hypertension: Secondary | ICD-10-CM | POA: Diagnosis not present

## 2015-07-18 DIAGNOSIS — E7800 Pure hypercholesterolemia, unspecified: Secondary | ICD-10-CM

## 2015-07-18 LAB — COMPLETE METABOLIC PANEL WITH GFR
ALT: 79 U/L — ABNORMAL HIGH (ref 9–46)
AST: 31 U/L (ref 10–40)
Albumin: 4.3 g/dL (ref 3.6–5.1)
Alkaline Phosphatase: 56 U/L (ref 40–115)
BUN: 9 mg/dL (ref 7–25)
CO2: 29 mmol/L (ref 20–31)
Calcium: 9.4 mg/dL (ref 8.6–10.3)
Chloride: 102 mmol/L (ref 98–110)
Creat: 0.91 mg/dL (ref 0.60–1.35)
GFR, Est African American: >89 mL/min (ref >=60)
GFR, Est Non African American: >89 mL/min (ref >=60)
Glucose, Bld: 102 mg/dL — ABNORMAL HIGH (ref 65–99)
Potassium: 4 mmol/L (ref 3.5–5.3)
Sodium: 141 mmol/L (ref 135–146)
Total Bilirubin: 0.5 mg/dL (ref 0.2–1.2)
Total Protein: 6.5 g/dL (ref 6.1–8.1)

## 2015-07-18 MED ORDER — GABAPENTIN 300 MG PO CAPS: 600.0000 mg | ORAL_CAPSULE | Freq: Two times a day (BID) | ORAL | Status: DC

## 2015-07-18 MED ORDER — TRIAMCINOLONE ACETONIDE 0.025 % EX CREA: 1.0000 "application " | TOPICAL_CREAM | Freq: Two times a day (BID) | CUTANEOUS | Status: DC

## 2015-07-18 MED ORDER — COLESEVELAM HCL 625 MG PO TABS: 3750.0000 mg | ORAL_TABLET | Freq: Every day | ORAL | Status: DC

## 2015-07-18 NOTE — Patient Instructions (Signed)
Recheck in 2 months Increase gabapentin to 2 pills 2x/day

## 2015-07-18 NOTE — Progress Notes (Signed)
Brandon [name]  MRN: [id number] DOB: January 20, 1971  Subjective:  Pt presents to clinic for a recheck.  He has been doing well.  Amrix does not seem to be working as well as it used to - on days where his pain is really bad he adds some tylenol and that does help with his neck pain which is about the same overall.  This does not happen often.  He just wants to make sure it is ok to add the tylenol.  Nose lesion - started a few months ago when his glasses fell low and he got a lesion - he got his glasses fixed and they no longer sit on that spot but but the lesion has continued to scab and then peel off and then scab again in a dry skin appearance - he has h/o skin cancers in his family and he has had multiple sunburns on his nose in his life - he has used no cream on the area  He needs an insurance form filled out.  He is not doing a great job taking his gabapentin tid - he will take almost daily and most days bid but rarely tid.    He has stopped swimming because his weight loss plateaued and now he is using an abdominal crunch machine that supports his neck and works his abdominal muscles.  Patient Active Problem List   Diagnosis Date Noted  . Benign essential HTN 12/05/2014  . Elevated cholesterol 12/05/2014  . Macroglossia 09/05/2014  . Severe obesity (BMI >= 40) (HCC) 09/05/2014  . ADEM (acute disseminated encephalomyelitis) (postinfectious) 09/05/2014  . Anxiety state, unspecified 12/11/2013  . Motor tic disorder 12/11/2013  . OSA on CPAP 10/04/2013  . Hypersomnia with sleep apnea, unspecified 10/04/2013  . High frequency hearing loss 08/24/2013  . Peripheral neuropathy (HCC) 04/19/2013  . Abnormal gait 10/19/2012  . Cervical myelopathy (HCC) 10/19/2012  . Esophageal reflux 07/27/2011  . Obesity, Class III, BMI 40-49.9 (morbid obesity) (HCC) 07/22/2011  . ADD (attention deficit disorder) 07/22/2011  . ADEM (acute disseminated encephalomyelitis) 07/03/2011  . Splenomegaly  07/03/2011    Current Outpatient Prescriptions on File Prior to Visit  Medication Sig Dispense Refill  . atorvastatin (LIPITOR) 10 MG tablet TAKE 1 TABLET (10 MG TOTAL) BY MOUTH DAILY 90 tablet 0  . cloNIDine (CATAPRES) 0.1 MG tablet INCREASE AS TOLERATED TO 1 TABLET BY MOUTH 3 TIMES A DAY    . cyclobenzaprine (AMRIX) 30 MG 24 hr capsule Take one capsule by mouth every day as needed.   "NO MORE REFILLS WITHOUT OFFICE VISIT" 30 capsule 0  . diclofenac (VOLTAREN) 75 MG EC tablet TAKE 1 TABLET (75 MG TOTAL) BY MOUTH 2 (TWO) TIMES DAILY. 180 tablet 1  . lisinopril-hydrochlorothiazide (PRINZIDE,ZESTORETIC) 10-12.5 MG tablet TAKE 1 TABLET BY MOUTH EVERYDAY  "NO MORE REFILLS WITHOUT OFFICE VISIT" 90 tablet 0  . metFORMIN (GLUCOPHAGE-XR) 500 MG 24 hr tablet TAKE 2 TABLETS (1,000 MG TOTAL) BY MOUTH DAILY WITH BREAKFAST 360 tablet 0  . aspirin 81 MG tablet Take 81 mg by mouth daily. Reported on 07/18/2015     No current facility-administered medications on file prior to visit.    Allergies  Allergen Reactions  . Penicillin G Anaphylaxis    Gets extremely high fevers.  . Sulfa Antibiotics Other (See Comments)    It will kill him  . Amoxicillin   . Cephalosporins Other (See Comments)    Reaction unspecified.  [name] Penicillins Other (See Comments)    fever  .  Quinolones Other (See Comments)    Reaction unspecified  . Sulfamethoxazole Swelling    Review of Systems  Musculoskeletal: Positive for neck pain (no change).   Objective:  BP 122/80 mmHg  Pulse 96  Temp(Src) 99 F (37.2 C) (Oral)  Resp 16  Ht 5\' 8"  (1.727 m)  Wt 299 lb 3.2 oz (135.716 kg)  BMI 45.50 kg/m2  SpO2 97%  Physical Exam  Constitutional: He is oriented to person, place, and time and well-developed, well-nourished, and in no distress.  HENT:  Head: Normocephalic and atraumatic.  Right Ear: External ear normal.  Left Ear: External ear normal.  Eyes: Conjunctivae are normal.  Neck: Normal range of motion.    Cardiovascular: Normal rate, regular rhythm, normal heart sounds and intact distal pulses.   Pulmonary/Chest: Effort normal and breath sounds normal. He has no wheezes.  Musculoskeletal:       Right lower leg: He exhibits no edema.       Left lower leg: He exhibits no edema.  Neurological: He is alert and oriented to person, place, and time. Gait normal.  Skin: Skin is warm and dry.  Right nose at bridge area just below where the eyeglass pad sit there is a 1/2cm scaling lesion with minimal erythema  Psychiatric: Mood, memory, affect and judgment normal.    Assessment and Plan :  Lesion of nose - Plan: Ambulatory referral to Dermatology, triamcinolone (KENALOG) 0.025 % cream - we will try steroid cream to help with inflammation but I am concerned this could be a pre-cancer lesion and due to the location will send to derm for evaluation and possible biopsy  Benign essential HTN - good controlled - continue current medication  Elevated cholesterol - Plan: colesevelam (WELCHOL) 625 MG tablet, COMPLETE METABOLIC PANEL WITH GFR - pt's triglycerides are elevated - we talked about different treatment options and we will start with welchol  Polyneuropathy associated with underlying disease (HCC) - Plan: gabapentin (NEURONTIN) 300 MG capsule - we discussed best treatment for his pain - he is not compliant with tid gabapentin so we will change to a larger dose with bid dosing in hopes to increase compliance - we discussed that I think that swimming is better exercise for him even though the current machine has neck support he needs cardiovascular exercise and exercise that helps all his muscles.  Elevated LFTs - Plan: COMPLETE METABOLIC PANEL WITH GFR - pt has had neg Hep panel in 2015 and has not changed sexual partners and does not use IV drugs - we will recheck today and determine the next step once the labs are back.   Recheck in 2 months -   Benny Lennert PA-C  [facility] 07/18/2015 4:56 PM

## 2015-07-28 ENCOUNTER — Other Ambulatory Visit: Payer: Self-pay | Admitting: Physician Assistant

## 2015-08-01 ENCOUNTER — Other Ambulatory Visit: Payer: Self-pay

## 2015-08-01 MED ORDER — CYCLOBENZAPRINE HCL ER 30 MG PO CP24: ORAL_CAPSULE | ORAL | Status: DC

## 2015-08-05 ENCOUNTER — Other Ambulatory Visit: Payer: Self-pay | Admitting: Physician Assistant

## 2015-08-12 ENCOUNTER — Other Ambulatory Visit: Payer: Self-pay | Admitting: Physician Assistant

## 2015-08-30 ENCOUNTER — Other Ambulatory Visit: Payer: Self-pay | Admitting: Physician Assistant

## 2015-10-03 ENCOUNTER — Ambulatory Visit: Payer: Managed Care, Other (non HMO) | Admitting: Physician Assistant

## 2015-10-08 ENCOUNTER — Telehealth: Payer: Self-pay

## 2015-10-08 NOTE — Telephone Encounter (Signed)
Per note documented in patient's chart: Received fax from [facility] stating that patient did not show up or call to cancel appointment. Per Dr. Jorja Loa they will not reschedule him at their location  Patient stated he did not receive a phone call concerning his referral to the dermatologist. He have no record of anyone contacting him. Patient will like to reschedule appointment with dermatologist. [phone].

## 2015-10-08 NOTE — Telephone Encounter (Signed)
I have reopened the referral.  I cannot send to [facility] again because they already stated they will not reschedule due to the no show.  I have send his referral to [facility] for the first available provider.

## 2015-10-15 ENCOUNTER — Ambulatory Visit (INDEPENDENT_AMBULATORY_CARE_PROVIDER_SITE_OTHER): Payer: Managed Care, Other (non HMO) | Admitting: Physician Assistant

## 2015-10-15 ENCOUNTER — Encounter: Payer: Self-pay | Admitting: Physician Assistant

## 2015-10-15 VITALS — BP 136/90 | HR 66 | Temp 98.4°F | Resp 16 | Ht 68.0 in | Wt 307.4 lb

## 2015-10-15 DIAGNOSIS — E161 Other hypoglycemia: Secondary | ICD-10-CM

## 2015-10-15 DIAGNOSIS — R748 Abnormal levels of other serum enzymes: Secondary | ICD-10-CM | POA: Diagnosis not present

## 2015-10-15 DIAGNOSIS — I10 Essential (primary) hypertension: Secondary | ICD-10-CM

## 2015-10-15 DIAGNOSIS — E781 Pure hyperglyceridemia: Secondary | ICD-10-CM | POA: Diagnosis not present

## 2015-10-15 DIAGNOSIS — E119 Type 2 diabetes mellitus without complications: Secondary | ICD-10-CM | POA: Insufficient documentation

## 2015-10-15 LAB — POCT URINALYSIS DIP (MANUAL ENTRY)
Bilirubin, UA: NEGATIVE
Blood, UA: NEGATIVE
Glucose, UA: NEGATIVE
Ketones, POC UA: NEGATIVE
Leukocytes, UA: NEGATIVE
Nitrite, UA: NEGATIVE
Protein Ur, POC: NEGATIVE
Spec Grav, UA: 1.015
Urobilinogen, UA: 0.2
pH, UA: 6

## 2015-10-15 LAB — POC MICROSCOPIC URINALYSIS (UMFC): Mucus: ABSENT

## 2015-10-15 NOTE — Patient Instructions (Signed)
     IF you received an x-ray today, you will receive an invoice from [facility]. Please contact [facility] at [phone] with questions or concerns regarding your invoice.   IF you received labwork today, you will receive an invoice from [facility]. Please contact [facility] at [phone] with questions or concerns regarding your invoice.   Our billing staff will not be able to assist you with questions regarding bills from these companies.  You will be contacted with the lab results as soon as they are available. The fastest way to get your results is to activate your My Chart account. Instructions are located on the last page of this paperwork. If you have not heard from us regarding the results in 2 weeks, please contact this office.      

## 2015-10-15 NOTE — Progress Notes (Signed)
Patient ID: Brandon [name], male    DOB: 1970/09/11, 45 y.o.   MRN: [id number]  PCP: Virgilio Belling  Subjective:   Chief Complaint  Patient presents with  . Follow-up  . bloodwork for liver enzymes    HPI Presents for evaluation of liver enzymes, and for urinalysis, recommended by his PCP, Ms. Weber, PA-C.  He feels well, without specific concerns or problems today.    Review of Systems     Patient Active Problem List   Diagnosis Date Noted  . Hyperinsulinemia 10/15/2015  . Hypertriglyceridemia 10/15/2015  . Benign essential HTN 12/05/2014  . Elevated cholesterol 12/05/2014  . Macroglossia 09/05/2014  . Severe obesity (BMI >= 40) (HCC) 09/05/2014  . ADEM (acute disseminated encephalomyelitis) (postinfectious) 09/05/2014  . Anxiety state, unspecified 12/11/2013  . Motor tic disorder 12/11/2013  . OSA on CPAP 10/04/2013  . Hypersomnia with sleep apnea, unspecified 10/04/2013  . High frequency hearing loss 08/24/2013  . Peripheral neuropathy (HCC) 04/19/2013  . Abnormal gait 10/19/2012  . Cervical myelopathy (HCC) 10/19/2012  . Esophageal reflux 07/27/2011  . Obesity, Class III, BMI 40-49.9 (morbid obesity) (HCC) 07/22/2011  . ADD (attention deficit disorder) 07/22/2011  . ADEM (acute disseminated encephalomyelitis) 07/03/2011  . Splenomegaly 07/03/2011     Prior to Admission medications   Medication Sig Start Date End Date Taking? Authorizing Provider  atorvastatin (LIPITOR) 10 MG tablet TAKE 1 TABLET (10 MG TOTAL) BY MOUTH DAILY 06/13/15  Yes Morrell Riddle, PA-C  cloNIDine (CATAPRES) 0.1 MG tablet INCREASE AS TOLERATED TO 1 TABLET BY MOUTH 3 TIMES A DAY 02/05/15  Yes Historical Provider, MD  colesevelam (WELCHOL) 625 MG tablet Take 6 tablets (3,750 mg total) by mouth daily with breakfast. 07/18/15  Yes Morrell Riddle, PA-C  cyclobenzaprine (AMRIX) 30 MG 24 hr capsule Take one capsule by mouth every day as needed. 08/01/15  Yes Morrell Riddle, PA-C  diclofenac  (VOLTAREN) 75 MG EC tablet TAKE 1 TABLET (75 MG TOTAL) BY MOUTH 2 (TWO) TIMES DAILY. 08/15/15  Yes Morrell Riddle, PA-C  gabapentin (NEURONTIN) 300 MG capsule Take 2 capsules (600 mg total) by mouth 2 (two) times daily. 07/18/15  Yes [name] Harvie Bridge, PA-C  lisinopril-hydrochlorothiazide (PRINZIDE,ZESTORETIC) 10-12.5 MG tablet TAKE 1 TABLET BY MOUTH EVERY DAY (NEED APPT FOR MORE REFILLS!) 08/31/15  Yes Morrell Riddle, PA-C  metFORMIN (GLUCOPHAGE-XR) 500 MG 24 hr tablet TAKE 2 TABLETS (1,000 MG TOTAL) BY MOUTH DAILY WITH BREAKFAST 03/04/15  Yes Tasean Mancha, PA-C  triamcinolone (KENALOG) 0.025 % cream Apply 1 application topically 2 (two) times daily. 07/18/15  Yes Morrell Riddle, PA-C  aspirin 81 MG tablet Take 81 mg by mouth daily. Reported on 10/15/2015    Historical Provider, MD     Allergies  Allergen Reactions  . Penicillin G Anaphylaxis    Gets extremely high fevers.  . Sulfa Antibiotics Other (See Comments)    It will kill him  . Amoxicillin   . Cephalosporins Other (See Comments)    Reaction unspecified.  [name] Penicillins Other (See Comments)    fever  . Quinolones Other (See Comments)    Reaction unspecified  . Sulfamethoxazole Swelling       Objective:  Physical Exam  Constitutional: He is oriented to person, place, and time. He appears well-developed and well-nourished. He is active and cooperative. No distress.  BP 136/90 mmHg  Pulse 66  Temp(Src) 98.4 F (36.9 C) (Oral)  Resp 16  Ht 5\' 8"  (1.727 m)  Wt 307 lb 6.4 oz (139.436 kg)  BMI 46.75 kg/m2  SpO2 99%  HENT:  Head: Normocephalic and atraumatic.  Right Ear: Hearing normal.  Left Ear: Hearing normal.  Eyes: Conjunctivae are normal. No scleral icterus.  Neck: Normal range of motion. Neck supple. No thyromegaly present.  Cardiovascular: Normal rate, regular rhythm and normal heart sounds.   Pulses:      Radial pulses are 2+ on the right side, and 2+ on the left side.  Pulmonary/Chest: Effort normal and breath sounds  normal.  Lymphadenopathy:       Head (right side): No tonsillar, no preauricular, no posterior auricular and no occipital adenopathy present.       Head (left side): No tonsillar, no preauricular, no posterior auricular and no occipital adenopathy present.    He has no cervical adenopathy.       Right: No supraclavicular adenopathy present.       Left: No supraclavicular adenopathy present.  Neurological: He is alert and oriented to person, place, and time. No sensory deficit.  Skin: Skin is warm, dry and intact. No rash noted. No cyanosis or erythema. Nails show no clubbing.  Psychiatric: He has a normal mood and affect. His speech is normal and behavior is normal.           Assessment & Plan:   1. Abnormal liver enzymes - Comprehensive metabolic panel  2. Hyperinsulinemia - Hemoglobin A1c  3. Hypertriglyceridemia - Lipid panel  4. Benign essential HTN - POCT urinalysis dipstick - POCT Microscopic Urinalysis ([facility])   Fernande Bras, PA-C Physician Assistant-Certified [facility]

## 2015-10-16 LAB — COMPREHENSIVE METABOLIC PANEL
ALT: 81 U/L — ABNORMAL HIGH (ref 9–46)
AST: 36 U/L (ref 10–40)
Albumin: 4.2 g/dL (ref 3.6–5.1)
Alkaline Phosphatase: 50 U/L (ref 40–115)
BUN: 11 mg/dL (ref 7–25)
CO2: 29 mmol/L (ref 20–31)
Calcium: 9 mg/dL (ref 8.6–10.3)
Chloride: 102 mmol/L (ref 98–110)
Creat: 0.83 mg/dL (ref 0.60–1.35)
Glucose, Bld: 75 mg/dL (ref 65–99)
Potassium: 3.9 mmol/L (ref 3.5–5.3)
Sodium: 141 mmol/L (ref 135–146)
Total Bilirubin: 0.6 mg/dL (ref 0.2–1.2)
Total Protein: 6.6 g/dL (ref 6.1–8.1)

## 2015-10-16 LAB — HEMOGLOBIN A1C
Hgb A1c MFr Bld: 6.3 % — ABNORMAL HIGH (ref <5.7)
Mean Plasma Glucose: 134 mg/dL

## 2015-10-16 LAB — LIPID PANEL
Cholesterol: 200 mg/dL (ref 125–200)
HDL: 29 mg/dL — ABNORMAL LOW (ref >=40)
LDL Cholesterol: 101 mg/dL (ref <130)
Total CHOL/HDL Ratio: 6.9 Ratio — ABNORMAL HIGH (ref <=5.0)
Triglycerides: 352 mg/dL — ABNORMAL HIGH (ref <150)
VLDL: 70 mg/dL — ABNORMAL HIGH (ref <30)

## 2015-11-01 ENCOUNTER — Other Ambulatory Visit: Payer: Self-pay | Admitting: Physician Assistant

## 2015-11-03 NOTE — Telephone Encounter (Signed)
What is follow up plan?  Did he see dermatology.

## 2015-11-05 NOTE — Telephone Encounter (Signed)
Done

## 2015-11-05 NOTE — Telephone Encounter (Signed)
[name], did you, or want to make any changes to pt's chol meds after 4/11 lipid panel that Chelle forwarded to you? Just wanted to check before OKing this RF. Also, pt was scheduled to see Dr Janalyn Harder in Jan. Do you want me to have pharm sent triamcinolone req to him? If not, do you want to increase the quantity of tube?

## 2015-11-27 ENCOUNTER — Other Ambulatory Visit: Payer: Self-pay | Admitting: Physician Assistant

## 2015-12-03 NOTE — Telephone Encounter (Signed)
Pharmacy is following up on request.

## 2015-12-05 ENCOUNTER — Other Ambulatory Visit: Payer: Self-pay | Admitting: Physician Assistant

## 2015-12-10 ENCOUNTER — Telehealth: Payer: Self-pay

## 2015-12-10 NOTE — Telephone Encounter (Signed)
Ins faxed form with repeat of some questions and add'l two questions to answer. I completed and faxed back form.

## 2015-12-10 NOTE — Telephone Encounter (Signed)
Got fax from pharm stating that Amrix needs a PA, and that only $500 is covered by savings card now if ins does not cover the medication. Price for 30 day supply is $575. Called [employer]rix help desk and they clarified that the rules changed on May 23rd, and now if ins does not cover the med, there is a $500 cap on price covered by the savings card. In this case, pt has tried/failed the IR cyclobenzaprine and many other meds. I have completed PA on covermymeds and it is pending.

## 2015-12-11 NOTE — Telephone Encounter (Signed)
Got a fax from [employer] stating they don't cover pt's meds. I called [employer] (covermymeds form had converted pt's correct ID # to a different one when it checked eligibility) and was advised that Amrix is a formulary exception, but that a letter of med Arther Dames could be faxed to the "Exceptions Dept" at [phone]. I have written the letter and faxed.

## 2015-12-12 NOTE — Telephone Encounter (Signed)
PA was approved through 12/10/2016. Notified pharm, and asked them to still apply savings card for further savings.

## 2015-12-22 ENCOUNTER — Other Ambulatory Visit: Payer: Self-pay | Admitting: Physician Assistant

## 2016-02-20 ENCOUNTER — Telehealth: Payer: Self-pay

## 2016-02-20 NOTE — Telephone Encounter (Signed)
Brandon [name] - Pt needs fax sent to [phone] explaining why he needs the amrix specifically for his condition.  Its called "formulary exception."  His insurance is requiring this to cover the prescription.  His number is [phone]

## 2016-02-25 NOTE — Telephone Encounter (Signed)
Called ins because this PA was approved in June through 12/11/16. Called ins [employer] and found that pt has both commercial and Med D coverage and the PA was approved under Med D plan. That ID # is [id number]. Called pharm back and gave them info on Med D ID to use for Rx and it went through for $3 +. Called and left info for pt on VM.

## 2016-02-29 ENCOUNTER — Other Ambulatory Visit: Payer: Self-pay | Admitting: Physician Assistant

## 2016-04-18 ENCOUNTER — Other Ambulatory Visit: Payer: Self-pay | Admitting: Physician Assistant

## 2016-04-27 ENCOUNTER — Encounter: Payer: Self-pay | Admitting: Neurology

## 2016-04-28 ENCOUNTER — Encounter: Payer: Self-pay | Admitting: Neurology

## 2016-04-28 ENCOUNTER — Ambulatory Visit (INDEPENDENT_AMBULATORY_CARE_PROVIDER_SITE_OTHER): Payer: Managed Care, Other (non HMO) | Admitting: Neurology

## 2016-04-28 VITALS — Resp 20 | Ht 68.0 in | Wt 302.0 lb

## 2016-04-28 DIAGNOSIS — G4733 Obstructive sleep apnea (adult) (pediatric): Secondary | ICD-10-CM

## 2016-04-28 DIAGNOSIS — Z9989 Dependence on other enabling machines and devices: Secondary | ICD-10-CM

## 2016-04-28 NOTE — Progress Notes (Signed)
[facility]  PATIENT: Brandon [name] DOB: 1971-05-17  REFERRING CLINICIAN:  HISTORY FROM: patient and partner REASON FOR VISIT: follow up   HISTORICAL  CHIEF COMPLAINT:  Chief Complaint  Patient presents with  . Follow-up    wants new cpap, using [employer]    HISTORY OF PRESENT ILLNESS:   UPDATE 12/05/14:  VP- Since last visit, was dx'd with ADEM by Dr. Orie Rout, possibly related to chicken pox at age 20yrs old vs viral infx at age 16 years old. Also had neuropsych testing in [facility] (dx'd with ADHD). Tried nuvigil, ritalin, concerta in past, without benefit. Having trouble focusing on tasks at home. These issues have worsened in the last 6 months. He has cervical spinal arthritis, reportedly diagnosed by his PCP.   UPDATE 08/10/12: Lost to follow up due to unpaid balance and some mixup. Unfortunately has not had referral to [facility] for PPMS eval. Since last visit, continue RLE weakness. Int numbness in hands. Anxiety is worse. Sleep apnea is better. UPDATE 02/06/11: Symptoms stable.  Cyclobenz helping muscle spasms.  Applying for disability.   UPDATE 09/19/10: Still with worsening gait, muscle twitching, and memory issues.  Doing better on CPAP with OSA.   PRIOR HPI (06/20/10): 45 year old male with history of hypertension, insulin resistance, here for evaluation of abnormal MRI of the brain, tremor and gait difficulty. Birth and development: Born at [redacted] weeks gestation; no complications.  Had chicken pox at age 73 years old.  Numerous antibiotic allergic reactions.  Had trouble focusing in school, but was able to well on exams.  ? ADHD. Age 29 yrs developed intermittent tremor (RUE > LUE).  Some intermittent muscle spasms, also erratic jerky movements of arms. Age 48-29 yrs developed right leg "dragging" and difficulty walking. Symptoms worse in warm weather.  Possibly had difficulty getting out of a hot tub one time.  Saw Dr. Orlin Hilding in 2006.  MRI brain (10/11/07, 05/10/08) showed  periventricular and white matter T2 hyperintensities, suspicious for demyelinating disease.  LP (09/12/03) showed WBC 1, RBC 41, glucose 55, protein 34, VDRL NR, OCB 0, culture neg.   Interval history from 04/28/2016. I have the pleasure of following Mr. Proia for his obstructive sleep apnea treated with CPAP. Due to a mixup in his medical billing he was unable to see me for the last year. He has been using CPAP compliantly 100% of days and 93% of those this over 4 hours of continued use. Average user time is 7 hours 39 minutes, he is using an AutoSet between 5 and 20 cm water pressure with 27 m EPR. He has been 95th percentile pressure of 13.7 cm water which is well in the middle of his pressure window his residual AHI is 1.1. The only improvement possible would be in terms of air leaks as he often seems to lose the air seal -. There was clearly a trend over the month around the first week of October just before he got a new interface. It'll also help to shave!    In the past he has sometimes worked at night and slept in daytime but this has reverted back to a normal circadian rhythm. His overall nocturnal sleep time was over 6 hours. He feels refreshed and restored in the morning. His Epworth sleepiness score was endorsed at only 6 points, his fatigue severity score at 50, he reports that he sometimes chokes for air been sleeping without CPAP. This happens while a passenger, for example. He is also here  to inquire about the new trouble friendly mini CPAP machines. I explained to him that the dream station go does not have a humidifier visit, but the compliance data can be obtained through a smart phone, tablet or computer. This technology by [employer] is called "dream mapping".    REVIEW OF SYSTEMS: Full 14 system review of systems performed and notable only for ; High fatigue score on some days, no longer excessively daytime sleepy, choking for air when not sleeping with CPAP and then sleeping in a seated  position. Walking with a cane, he likes to sleep slightly elevated or reclined due to his abdominal girth, this also helps with his reflux.  ALLERGIES: Allergies  Allergen Reactions  . Penicillin G Anaphylaxis    Gets extremely high fevers.  . Sulfa Antibiotics Other (See Comments)    It will kill him  . Amoxicillin   . Cephalosporins Other (See Comments)    Reaction unspecified.  [name] Penicillins Other (See Comments)    fever  . Quinolones Other (See Comments)    Reaction unspecified  . Sulfamethoxazole Swelling    HOME MEDICATIONS: Outpatient Medications Prior to Visit  Medication Sig Dispense Refill  . aspirin 81 MG tablet Take 81 mg by mouth daily. Reported on 10/15/2015    . atorvastatin (LIPITOR) 10 MG tablet TAKE 1 TABLET (10 MG TOTAL) BY MOUTH DAILY 90 tablet 0  . cloNIDine (CATAPRES) 0.1 MG tablet INCREASE AS TOLERATED TO 1 TABLET BY MOUTH 3 TIMES A DAY    . cyclobenzaprine (AMRIX) 30 MG 24 hr capsule Take one capsule by mouth every day as needed. 90 capsule 1  . diclofenac (VOLTAREN) 75 MG EC tablet TAKE 1 TABLET (75 MG TOTAL) BY MOUTH 2 (TWO) TIMES DAILY. 60 tablet 0  . gabapentin (NEURONTIN) 300 MG capsule Take 2 capsules (600 mg total) by mouth 2 (two) times daily. 270 capsule 1  . lisinopril-hydrochlorothiazide (PRINZIDE,ZESTORETIC) 10-12.5 MG tablet Take 1 tablet by mouth daily. 90 tablet 1  . metFORMIN (GLUCOPHAGE-XR) 500 MG 24 hr tablet TAKE 2 TABLETS (1,000 MG TOTAL) BY MOUTH DAILY WITH BREAKFAST 360 tablet 0  . triamcinolone (KENALOG) 0.025 % cream APPLY TO AFFECTED AREA TWICE A DAY 15 g 0  . WELCHOL 625 MG tablet TAKE 6 TABLETS (3,750 MG TOTAL) BY MOUTH DAILY WITH BREAKFAST. 540 tablet 1  . gabapentin (NEURONTIN) 300 MG capsule Take 2 capsules (600 mg total) by mouth 2 (two) times daily. 360 capsule 0   No facility-administered medications prior to visit.     PAST MEDICAL HISTORY: Past Medical History:  Diagnosis Date  . Allergy   . Anxiety 2005  . Depression  2005  . Headache(784.0)   . Hearing loss   . Hyperinsulinemia 07/04/11   "I'm on metformin cause I produce too much insulin"  . Hyperlipidemia   . Hypertension   . Mononucleosis, infectious, with hepatitis 06/2011  . MS (multiple sclerosis) (HCC)   . Nausea    occasional per history form 08/18/11  . Neuromuscular disorder (HCC)   . Pneumonia 2010  . Sleep apnea    uses CPAP at home  . Stroke (HCC) 2000  . TIA (transient ischemic attack) 2000   denies residual  . TIA (transient ischemic attack)     PAST SURGICAL HISTORY: Past Surgical History:  Procedure Laterality Date  . CHOLECYSTECTOMY  08/02/2011   Procedure: LAPAROSCOPIC CHOLECYSTECTOMY;  Surgeon: Shelly Rubenstein, MD;  Location: [facility] OR;  Service: General;;  . COLONOSCOPY  2003  .  ESOPHAGOGASTRODUODENOSCOPY  2003  . ESOPHAGOGASTRODUODENOSCOPY  07/28/2011   Procedure: ESOPHAGOGASTRODUODENOSCOPY (EGD);  Surgeon: [name], MD;  Location: [facility] ENDOSCOPY;  Service: Endoscopy;  Laterality: N/A;  . MYRINGOTOMY  1979; 1980   bilaterally  . TONSILLECTOMY AND ADENOIDECTOMY  ~ 1980    FAMILY HISTORY: Family History  Problem Relation Age of Onset  . Coronary artery disease Father   . Stroke Father   . Diabetes Mother   . Cancer Maternal Grandmother     bone  . Cancer Other     Stomach -Great-great grandmother    SOCIAL HISTORY:  Social History   Social History  . Marital status: Single    Spouse name: N/A  . Number of children: 0  . Years of education: college   Occupational History  . disabled     Social History Main Topics  . Smoking status: Former Smoker    Packs/day: 3.00    Years: 3.00    Types: Cigarettes    Quit date: 08/18/1987  . Smokeless tobacco: Never Used  . Alcohol use 0.0 oz/week     Comment: 07/04/11 "glass of wine or spirits once a month"  . Drug use: No  . Sexual activity: Yes    Partners: Female   Other Topics Concern  . Not on file   Social History Narrative   Lives in [place]  with 2 domestic male partners "his family unit"   Caffeine: minimal     PHYSICAL EXAM  Vitals:   04/28/16 1007  Resp: 20  Weight: (!) 302 lb (137 kg)  Height: 5\' 8"  (1.727 m)   Wt Readings from Last 3 Encounters:  04/28/16 (!) 302 lb (137 kg)  10/15/15 (!) 307 lb 6.4 oz (139.4 kg)  07/18/15 299 lb 3.2 oz (135.7 kg)   Body mass index is 45.92 kg/m.  GENERAL EXAM: Patient is in no distress; well developed, nourished and groomed; neck is supple. Mallampati grade 5, uvula is not visible, tongue is in midline without tremor or fasciculation, no retrognathia, large neck circumference of 20.5 inches.   Nose is congested but not obstructed.  Morbidly obese.   CARDIOVASCULAR: Regular rate and rhythm, no murmurs, no carotid bruits  NEUROLOGIC: MENTAL STATUS: awake, alert, but inattentive. language fluent, comprehension intact, naming intact, fund of knowledge appropriate CRANIAL NERVE:pupils equal and reactive to light, visual fields full to confrontation, extraocular muscles intact, no nystagmus,  facial sensation and strength symmetric, hearing intact, palate elevates symmetrically, uvula midline, shoulder shrug symmetric, tongue midline. MOTOR: normal bulk; action tremor RUE; BUE (4 PROX, 3 DISTAL); BLE (3 PROX, 4 DISTAL) SENSORY: normal and symmetric to light touch COORDINATION: finger-nose-finger without ataxia.  REFLEXES: deep tendon reflexes are symmetric GAIT/STATION: wider  based gait; walks with single point cane.      DIAGNOSTIC DATA (LABS, IMAGING, TESTING) - I reviewed patient records, labs, notes, testing and imaging myself where available.  Lab Results  Component Value Date   WBC 5.4 12/05/2014   HGB 16.7 12/05/2014   HCT 49.3 12/05/2014   MCV 81.5 12/05/2014   PLT 147 (L) 12/05/2014      Component Value Date/Time   NA 141 10/15/2015 1112   K 3.9 10/15/2015 1112   CL 102 10/15/2015 1112   CO2 29 10/15/2015 1112   GLUCOSE 75 10/15/2015 1112   BUN 11  10/15/2015 1112   CREATININE 0.83 10/15/2015 1112   CALCIUM 9.0 10/15/2015 1112   PROT 6.6 10/15/2015 1112   ALBUMIN 4.2 10/15/2015 1112  AST 36 10/15/2015 1112   ALT 81 (H) 10/15/2015 1112   ALKPHOS 50 10/15/2015 1112   BILITOT 0.6 10/15/2015 1112   GFRNONAA >89 07/18/2015 1656   GFRAA >89 07/18/2015 1656   Lab Results  Component Value Date   CHOL 200 10/15/2015   HDL 29 (L) 10/15/2015   LDLCALC 101 10/15/2015   TRIG 352 (H) 10/15/2015   CHOLHDL 6.9 (H) 10/15/2015   Lab Results  Component Value Date   HGBA1C 6.3 (H) 10/15/2015   No results found for: [id number] Lab Results  Component Value Date   TSH 2.339 12/05/2014       ASSESSMENT AND PLAN  45 y.o. year old male here with Dx:  OSA on CPAP, obesity hypoventilation, orthopnea.    PLAN:continue using CPAP for treatment of OSA  -  importance of diet- low carb , no added sugars, physical activity, fitness, organization techniques reviewed.  PCP Benny LennertSarah Weber, PA [facility].   I spent 25 minutes of face to face time with patient. Greater than 50% of time was spent in counseling and coordination of care with patient.  RV with Np in 12 month, for OSA and CPAP.       Yi Falletta, MD

## 2016-05-30 ENCOUNTER — Other Ambulatory Visit: Payer: Self-pay | Admitting: Physician Assistant

## 2016-06-03 ENCOUNTER — Ambulatory Visit: Payer: Managed Care, Other (non HMO)

## 2016-06-04 ENCOUNTER — Ambulatory Visit: Payer: Managed Care, Other (non HMO)

## 2016-06-04 ENCOUNTER — Other Ambulatory Visit: Payer: Self-pay

## 2016-06-04 NOTE — Telephone Encounter (Signed)
07/2015 last refill x 6 month supply 10/15/2015 last ov

## 2016-06-04 NOTE — Telephone Encounter (Signed)
Patient called to request a refill for Amrix.  Patient sees Weber.  He states that the pharmacy is having trouble holding it for him.  He was scheduled to have an appointment today but had to cancel due to balance.  Patient will reschedule when payment plan is set up.  [phone]

## 2016-06-06 MED ORDER — CYCLOBENZAPRINE HCL ER 30 MG PO CP24: ORAL_CAPSULE | ORAL | 0 refills | Status: DC

## 2016-06-25 ENCOUNTER — Other Ambulatory Visit: Payer: Self-pay | Admitting: Physician Assistant

## 2016-07-03 ENCOUNTER — Ambulatory Visit (INDEPENDENT_AMBULATORY_CARE_PROVIDER_SITE_OTHER): Payer: Managed Care, Other (non HMO) | Admitting: Physician Assistant

## 2016-07-03 ENCOUNTER — Ambulatory Visit (INDEPENDENT_AMBULATORY_CARE_PROVIDER_SITE_OTHER): Payer: Managed Care, Other (non HMO)

## 2016-07-03 VITALS — BP 148/98 | HR 74 | Temp 98.4°F | Resp 16 | Ht 68.0 in | Wt 301.0 lb

## 2016-07-03 DIAGNOSIS — M542 Cervicalgia: Secondary | ICD-10-CM | POA: Diagnosis not present

## 2016-07-03 DIAGNOSIS — G959 Disease of spinal cord, unspecified: Secondary | ICD-10-CM | POA: Diagnosis not present

## 2016-07-03 MED ORDER — HYDROCODONE-ACETAMINOPHEN 5-325 MG PO TABS: 1.0000 | ORAL_TABLET | Freq: Four times a day (QID) | ORAL | 0 refills | Status: DC | PRN

## 2016-07-03 NOTE — Progress Notes (Signed)
Patient ID: Brandon [name], male    DOB: 1970-10-19, 45 y.o.   MRN: [id number]  PCP: Virgilio BellingWEBER,[name], PA-C  Chief Complaint  Patient presents with  . Neck Pain    spine pain x 74month    Subjective:   Presents for evaluation of acute on chronic neck pain. He is accompanied by his wife.  Takes Amrix and diclofenac for his chronic pain. Amrix doesn't last 24 hours any more, only lasts 20 hours, and isn't as helpful.  Pain starts at the base of the skull, extends down to about C7, then extends across the top of the LEFT shoulder, and then a several inch portion of the dorsal LEFT forearm feels like it's being "crushed."  Symptoms initially occurred when there was a mix up at the pharmacy and he didn't have Amrix x 2 weeks. Has been back on the Amrix x 2 weeks, but it's not gotten him back to baseline. Getting only 2-3 hours of sleep/night. TENS unit helps, but only while he's using it. Heat also helps while he's using it. Pain ranges 4-6/10.  Has had a stomach bug the past few days, and he wasn't eating, so hasn't taken metformin for several days. Hasn't taken the gabapentin in about the past month due to increased pain, and not eating as much, and if he takes it without food, he feels sick.   Review of Systems As above.    Patient Active Problem List   Diagnosis Date Noted  . Hyperinsulinemia 10/15/2015  . Hypertriglyceridemia 10/15/2015  . Benign essential HTN 12/05/2014  . Elevated cholesterol 12/05/2014  . Macroglossia 09/05/2014  . Severe obesity (BMI >= 40) (HCC) 09/05/2014  . ADEM (acute disseminated encephalomyelitis) (postinfectious) 09/05/2014  . Anxiety state, unspecified 12/11/2013  . Motor tic disorder 12/11/2013  . OSA on CPAP 10/04/2013  . Hypersomnia with sleep apnea, unspecified 10/04/2013  . High frequency hearing loss 08/24/2013  . Peripheral neuropathy (HCC) 04/19/2013  . Abnormal gait 10/19/2012  . Cervical myelopathy (HCC) 10/19/2012  . Esophageal  reflux 07/27/2011  . Obesity, Class III, BMI 40-49.9 (morbid obesity) (HCC) 07/22/2011  . ADD (attention deficit disorder) 07/22/2011  . ADEM (acute disseminated encephalomyelitis) 07/03/2011  . Splenomegaly 07/03/2011     Prior to Admission medications   Medication Sig Start Date End Date Taking? Authorizing Provider  cloNIDine (CATAPRES) 0.1 MG tablet INCREASE AS TOLERATED TO 1 TABLET BY MOUTH 3 TIMES A DAY 02/05/15  Yes Historical Provider, MD  cyclobenzaprine (AMRIX) 30 MG 24 hr capsule Take one capsule by mouth every day as needed. 06/06/16  Yes Morrell RiddleSarah L Weber, PA-C  diclofenac (VOLTAREN) 75 MG EC tablet TAKE 1 TABLET (75 MG TOTAL) BY MOUTH 2 (TWO) TIMES DAILY. 03/02/16  Yes Morrell RiddleSarah L Weber, PA-C  gabapentin (NEURONTIN) 300 MG capsule Take 2 capsules (600 mg total) by mouth 2 (two) times daily. 07/18/15  Yes [name] Harvie BridgeL Weber, PA-C  lisinopril-hydrochlorothiazide (PRINZIDE,ZESTORETIC) 10-12.5 MG tablet TAKE 1 TABLET BY MOUTH EVERY DAY 06/30/16  Yes Ossiel Marchio, PA-C  metFORMIN (GLUCOPHAGE-XR) 500 MG 24 hr tablet TAKE 2 TABLETS (1,000 MG TOTAL) BY MOUTH DAILY WITH BREAKFAST 04/19/16  Yes Beila Purdie, PA-C  WELCHOL 625 MG tablet TAKE 6 TABLETS (3,750 MG TOTAL) BY MOUTH DAILY WITH BREAKFAST. 11/05/15  Yes Morrell RiddleSarah L Weber, PA-C  aspirin 81 MG tablet Take 81 mg by mouth daily. Reported on 10/15/2015    Historical Provider, MD  atorvastatin (LIPITOR) 10 MG tablet TAKE 1 TABLET (10 MG TOTAL) BY MOUTH DAILY  Patient not taking: Reported on 07/03/2016 06/13/15   Morrell Riddle, PA-C  triamcinolone (KENALOG) 0.025 % cream APPLY TO AFFECTED AREA TWICE A DAY Patient not taking: Reported on 07/03/2016 12/07/15   Porfirio Oar, PA-C     Allergies  Allergen Reactions  . Penicillin G Anaphylaxis    Gets extremely high fevers.  . Sulfa Antibiotics Other (See Comments)    It will kill him  . Amoxicillin   . Cephalosporins Other (See Comments)    Reaction unspecified.  [name] Penicillins Other (See Comments)     fever  . Quinolones Other (See Comments)    Reaction unspecified  . Sulfamethoxazole Swelling       Objective:  Physical Exam  Constitutional: He is oriented to person, place, and time. He appears well-developed and well-nourished. He is active and cooperative. No distress (but obviously uncomfortable).  BP (!) 148/98 (BP Location: Right Arm, Patient Position: Sitting, Cuff Size: Large)   Pulse 74   Temp 98.4 F (36.9 C) (Oral)   Resp 16   Ht 5\' 8"  (1.727 m)   Wt (!) 301 lb (136.5 kg)   SpO2 97%   BMI 45.77 kg/m   HENT:  Head: Normocephalic and atraumatic.  Right Ear: Hearing normal.  Left Ear: Hearing normal.  Eyes: Conjunctivae are normal. No scleral icterus.  Neck: Normal range of motion. Neck supple. No thyromegaly present.  Cardiovascular: Normal rate, regular rhythm and normal heart sounds.   Pulses:      Radial pulses are 2+ on the right side, and 2+ on the left side.  Pulmonary/Chest: Effort normal and breath sounds normal.  Musculoskeletal:  Tenderness of the c-spine and paraspinous muscles into the the trapezius bilaterally. Mild spasm palpable in the trapezius. ROM is limited in extension and rotation due to pain.  Lymphadenopathy:       Head (right side): No tonsillar, no preauricular, no posterior auricular and no occipital adenopathy present.       Head (left side): No tonsillar, no preauricular, no posterior auricular and no occipital adenopathy present.    He has no cervical adenopathy.       Right: No supraclavicular adenopathy present.       Left: No supraclavicular adenopathy present.  Neurological: He is alert and oriented to person, place, and time. No sensory deficit.  Upper extremity strength appears normal on observation, but on testing is diminished, with questionable effort as it causes pain in the neck.  Skin: Skin is warm, dry and intact. No rash noted. No cyanosis or erythema. Nails show no clubbing.  Psychiatric: He has a normal mood and affect.  His speech is normal and behavior is normal.       Dg Cervical Spine Complete  Result Date: 07/03/2016 CLINICAL DATA:  Chronic neck pain, worse for 4 weeks. Radiation to the left arm. EXAM: CERVICAL SPINE - COMPLETE 4+ VIEW COMPARISON:  01/09/2015 FINDINGS: There is loss of cervical lordosis. This may be secondary to splinting, soft tissue injury, or positioning. Degenerative changes are identified, most prominently seen at C5-6, C6-7, and C4-5. There is mild foraminal narrowing bilaterally at C4-5. No evidence for acute fracture or subluxation. Lung apices are clear. IMPRESSION: 1. Mid cervical degenerative changes. 2. Loss of lordosis. Electronically Signed   By: Norva Pavlov M.D.   On: 07/03/2016 18:43       Assessment & Plan:   1. Neck pain 2. Cervical myelopathy (HCC) Known chronic neck pain with acute worsening without injury. Has stopped  gabapentin at the same time the pain worsened. Restart gabapentin. Continue Amrix and diclofenac. Hydrocodone temporarily until gabapentin is resumed.  - DG Cervical Spine Complete; Future - HYDROcodone-acetaminophen (NORCO) 5-325 MG tablet; Take 1-2 tablets by mouth every 6 (six) hours as needed.  Dispense: 30 tablet; Refill: 0    Fernande Bras, PA-C Physician Assistant-Certified [facility]

## 2016-07-03 NOTE — Patient Instructions (Addendum)
Restart the gabapentin. It can cause drowsiness, so you may want to consider starting 300 mg (one capsule/tablet) twice a day before increasing it to 2 twice a day. The hydrocodone can be used to allow you to rest. It can also cause drowsiness. As soon as you no longer need it, please stop it.    IF you received an x-ray today, you will receive an invoice from [facility]. Please contact [facility] at [phone] with questions or concerns regarding your invoice.   IF you received labwork today, you will receive an invoice from [facility]. Please contact [facility] at [phone] with questions or concerns regarding your invoice.   Our billing staff will not be able to assist you with questions regarding bills from these companies.  You will be contacted with the lab results as soon as they are available. The fastest way to get your results is to activate your My Chart account. Instructions are located on the last page of this paperwork. If you have not heard from [place] regarding the results in 2 weeks, please contact this office.

## 2016-07-07 ENCOUNTER — Other Ambulatory Visit: Payer: Self-pay | Admitting: Physician Assistant

## 2016-07-09 ENCOUNTER — Ambulatory Visit (INDEPENDENT_AMBULATORY_CARE_PROVIDER_SITE_OTHER): Payer: Managed Care, Other (non HMO) | Admitting: Physician Assistant

## 2016-07-09 ENCOUNTER — Telehealth: Payer: Self-pay

## 2016-07-09 ENCOUNTER — Ambulatory Visit (INDEPENDENT_AMBULATORY_CARE_PROVIDER_SITE_OTHER): Payer: Managed Care, Other (non HMO)

## 2016-07-09 VITALS — BP 132/80 | HR 79 | Temp 98.6°F | Resp 18 | Ht 68.0 in | Wt 307.0 lb

## 2016-07-09 DIAGNOSIS — M542 Cervicalgia: Secondary | ICD-10-CM | POA: Diagnosis not present

## 2016-07-09 DIAGNOSIS — M62838 Other muscle spasm: Secondary | ICD-10-CM

## 2016-07-09 DIAGNOSIS — G8929 Other chronic pain: Secondary | ICD-10-CM

## 2016-07-09 DIAGNOSIS — G63 Polyneuropathy in diseases classified elsewhere: Secondary | ICD-10-CM

## 2016-07-09 DIAGNOSIS — M25512 Pain in left shoulder: Secondary | ICD-10-CM

## 2016-07-09 DIAGNOSIS — R9389 Abnormal findings on diagnostic imaging of other specified body structures: Secondary | ICD-10-CM

## 2016-07-09 MED ORDER — HYDROCODONE-ACETAMINOPHEN 5-325 MG PO TABS: 1.0000 | ORAL_TABLET | Freq: Four times a day (QID) | ORAL | 0 refills | Status: DC | PRN

## 2016-07-09 MED ORDER — METAXALONE 800 MG PO TABS: 400.0000 mg | ORAL_TABLET | Freq: Every day | ORAL | 0 refills | Status: DC

## 2016-07-09 NOTE — Progress Notes (Signed)
Brandon [name]  MRN: [id number] DOB: 06-25-1971  Subjective:  Pt presents to clinic for recheck of his neck pain.  He was seen 12/29 for a flair in his chronic neck pain.  The pain is radiating into his shoulder and any movement causes increase in pain. He feels like his increase in pain started when he was out of his amrix for 2 weeks and he feels like his "whole body seized up" and he has been hurting since.  He has noticed that the Amrix is not lasting as long as it used to and he finds that he takes it about every 20 hours and when he does not the last 4-6 hours he is in intense pain.  The pain is on the left side starting in his neck and extending some mostly into the deltoid but sometimes just past the elbow.  When the pain is really bad he has tingling in his left fingers - the tingling is worse when he turns his neck.  Not sleeping well because of the pain.  Using a TENS unit which helps while it is on but the pain is worse after it is removed.  Ice helps, heat makes it worse.  He is currently no doing anything physical - no therapy nor exercise - "it hurts to move"  He sits on the couch most of the day propped up by pillows.  He has been using pain medications and the pain is improved but it still hurts to do any movement.  He has restarted the gabapentin - currently 2 pills when he wakes up - he stopped it because he associated it with nausea that was actually related to the metformin.  Review of Systems  Constitutional: Negative for chills and fever.  Musculoskeletal: Positive for neck pain.    Patient Active Problem List   Diagnosis Date Noted  . Hyperinsulinemia 10/15/2015  . Hypertriglyceridemia 10/15/2015  . Benign essential HTN 12/05/2014  . Elevated cholesterol 12/05/2014  . Macroglossia 09/05/2014  . Severe obesity (BMI >= 40) (HCC) 09/05/2014  . ADEM (acute disseminated encephalomyelitis) (postinfectious) 09/05/2014  . Anxiety state, unspecified 12/11/2013  . Motor tic  disorder 12/11/2013  . OSA on CPAP 10/04/2013  . Hypersomnia with sleep apnea, unspecified 10/04/2013  . High frequency hearing loss 08/24/2013  . Peripheral neuropathy (HCC) 04/19/2013  . Abnormal gait 10/19/2012  . Cervical myelopathy (HCC) 10/19/2012  . Esophageal reflux 07/27/2011  . Obesity, Class III, BMI 40-49.9 (morbid obesity) (HCC) 07/22/2011  . ADD (attention deficit disorder) 07/22/2011  . ADEM (acute disseminated encephalomyelitis) 07/03/2011  . Splenomegaly 07/03/2011    Current Outpatient Prescriptions on File Prior to Visit  Medication Sig Dispense Refill  . AMRIX 30 MG 24 hr capsule TAKE ONE CAPSULE BY MOUTH EVERY DAY AS NEEDED. 30 capsule 0  . cloNIDine (CATAPRES) 0.1 MG tablet INCREASE AS TOLERATED TO 1 TABLET BY MOUTH 3 TIMES A DAY    . diclofenac (VOLTAREN) 75 MG EC tablet TAKE 1 TABLET (75 MG TOTAL) BY MOUTH 2 (TWO) TIMES DAILY. 60 tablet 0  . gabapentin (NEURONTIN) 300 MG capsule Take 2 capsules (600 mg total) by mouth 2 (two) times daily. 270 capsule 1  . lisinopril-hydrochlorothiazide (PRINZIDE,ZESTORETIC) 10-12.5 MG tablet TAKE 1 TABLET BY MOUTH EVERY DAY 30 tablet 0  . metFORMIN (GLUCOPHAGE-XR) 500 MG 24 hr tablet TAKE 2 TABLETS (1,000 MG TOTAL) BY MOUTH DAILY WITH BREAKFAST 360 tablet 0  . WELCHOL 625 MG tablet TAKE 6 TABLETS (3,750 MG TOTAL) BY  MOUTH DAILY WITH BREAKFAST. 540 tablet 1  . aspirin 81 MG tablet Take 81 mg by mouth daily. Reported on 10/15/2015    . atorvastatin (LIPITOR) 10 MG tablet TAKE 1 TABLET (10 MG TOTAL) BY MOUTH DAILY (Patient not taking: Reported on 07/09/2016) 90 tablet 0  . triamcinolone (KENALOG) 0.025 % cream APPLY TO AFFECTED AREA TWICE A DAY (Patient not taking: Reported on 07/09/2016) 15 g 0   No current facility-administered medications on file prior to visit.     Allergies  Allergen Reactions  . Penicillin G Anaphylaxis    Gets extremely high fevers.  . Sulfa Antibiotics Other (See Comments)    It will kill him  . Amoxicillin    . Cephalosporins Other (See Comments)    Reaction unspecified.  [name] Penicillins Other (See Comments)    fever  . Quinolones Other (See Comments)    Reaction unspecified  . Sulfamethoxazole Swelling    Pt patients past, family and social history were reviewed and updated.   Objective:  BP 132/80 (BP Location: Right Arm, Patient Position: Sitting, Cuff Size: Large)   Pulse 79   Temp 98.6 F (37 C) (Oral)   Resp 18   Ht 5\' 8"  (1.727 m)   Wt (!) 307 lb (139.3 kg)   SpO2 95%   BMI 46.68 kg/m   Physical Exam  Constitutional: He is oriented to person, place, and time and well-developed, well-nourished, and in no distress.  HENT:  Head: Normocephalic and atraumatic.  Right Ear: External ear normal.  Left Ear: External ear normal.  Eyes: Conjunctivae are normal.  Neck: Normal range of motion.  Pulmonary/Chest: Effort normal.  Musculoskeletal:       Right shoulder: Normal.       Left shoulder: He exhibits decreased range of motion (decrease ROM - pt is not able to abduct arm.) and tenderness (most of tenderness is muslce related).       Cervical back: He exhibits decreased range of motion, tenderness (along cerival spine and mianly to the left side) and spasm (palpable spasm along left c spine and trapezius and muscle along shoulder blade).  Limited exam due to patient's pain but patient is able to describe pain mostly in areas of muscle  Neurological: He is alert and oriented to person, place, and time.  On cane - normal for him  Skin: Skin is warm and dry.  Psychiatric: Mood, memory, affect and judgment normal.  Vitals reviewed.  Dg Shoulder Left  Result Date: 07/09/2016 CLINICAL DATA:  Chronic Lt Shoulder pain -NKI- Unable to position the patient for Axial view due to his size and pain. EXAM: LEFT SHOULDER - 2+ VIEW COMPARISON:  None. FINDINGS: There is no evidence of fracture or dislocation. There is no evidence of arthropathy or other focal bone abnormality. Soft tissues are  unremarkable. IMPRESSION: Negative. Electronically Signed   By: Norva Pavlov M.D.   On: 07/09/2016 11:18    Assessment and Plan :  Chronic left shoulder pain - Plan: DG Shoulder Left  Muscle spasm - Plan: Ambulatory referral to Physical Therapy, metaxalone (SKELAXIN) 800 MG tablet  Chronic neck pain - Plan: Ambulatory referral to Physical Therapy, HYDROcodone-acetaminophen (NORCO) 5-325 MG tablet   Pt had a normal left shoulder xray and last visit had xrays of cspine which were not much different than xrays 01/2015 when he had a similar flair which was reduced with muscle relaxers and PT.  I have done another refill of norco but this will be the  last Rx.  He needs to not take him Amrix early - we will try adding a different muscle relaxer Skelaxin in the evening to see if we can reduce his pain.  This pain seems muscle in origin and I think a referral to ortho will not change his care.  He does not have good posture and is not active both of which is increasing his possibility of this type of pain.  He will increase his gabapentin to at least bid but I would like him to go to tid because in the past it has helped (though he does have trouble remembering to take it).  We will restart PT because without that combination this pain in the past continued for a long time.  He understands and agrees with the plan.  If patient does not improve it might be reasonable to do an MRI of his neck.  Benny Lennert PA-C  [facility] 07/09/2016 3:27 PM

## 2016-07-09 NOTE — Patient Instructions (Signed)
Increase the gabapentin to 2 pills 2x/day and then a week later increase to 2 pills 3x/day - this will help your pain and by staying on this you can decrease

## 2016-07-09 NOTE — Telephone Encounter (Signed)
CHELLE OR [name]  - Pt was seen last Friday and forgot to ask for refills on his gabapentin; his anti-inflammatory medication (he can't remember the name, but says it starts with a d); and hydrocodone for pain.  He said he was given 30 pill of hydrocodone to try and see if it helped with the pain.  He says it wasn't enough and wanted to know if he could get about 15 more.  [phone]

## 2016-07-13 ENCOUNTER — Other Ambulatory Visit: Payer: Self-pay | Admitting: Physician Assistant

## 2016-07-14 ENCOUNTER — Encounter: Payer: Self-pay | Admitting: Physician Assistant

## 2016-07-14 DIAGNOSIS — M62838 Other muscle spasm: Secondary | ICD-10-CM

## 2016-07-14 DIAGNOSIS — R202 Paresthesia of skin: Secondary | ICD-10-CM

## 2016-07-14 DIAGNOSIS — G8929 Other chronic pain: Secondary | ICD-10-CM

## 2016-07-14 DIAGNOSIS — M542 Cervicalgia: Secondary | ICD-10-CM

## 2016-07-14 NOTE — Telephone Encounter (Signed)
Pt has already been seen and this issue has been dealt with

## 2016-07-14 NOTE — Telephone Encounter (Signed)
SS refill req Voltaren 07/09/2016 [name] put him on Skelaxin. Please advise

## 2016-07-15 ENCOUNTER — Other Ambulatory Visit: Payer: Self-pay | Admitting: Physician Assistant

## 2016-07-15 ENCOUNTER — Other Ambulatory Visit: Payer: Self-pay

## 2016-07-15 DIAGNOSIS — G8929 Other chronic pain: Secondary | ICD-10-CM

## 2016-07-15 DIAGNOSIS — M542 Cervicalgia: Principal | ICD-10-CM

## 2016-07-15 MED ORDER — HYDROCODONE-ACETAMINOPHEN 7.5-325 MG PO TABS: 1.0000 | ORAL_TABLET | Freq: Four times a day (QID) | ORAL | 0 refills | Status: DC | PRN

## 2016-07-15 NOTE — Telephone Encounter (Signed)
[facility] is checking on the status of a refill request for voltaren   Best number [phone]

## 2016-07-15 NOTE — Telephone Encounter (Signed)
07/03/16 last ov, please advise and refill if volteran is still part of the plan

## 2016-07-21 MED ORDER — HYDROCODONE-ACETAMINOPHEN 7.5-325 MG PO TABS: 1.0000 | ORAL_TABLET | Freq: Four times a day (QID) | ORAL | 0 refills | Status: DC | PRN

## 2016-07-21 NOTE — Addendum Note (Signed)
Addended by: Morrell Riddle on: 07/21/2016 03:33 PM   Modules accepted: Orders

## 2016-07-21 NOTE — Telephone Encounter (Signed)
Pt knows through mychart his Rx is ready

## 2016-07-25 ENCOUNTER — Other Ambulatory Visit: Payer: Self-pay | Admitting: Physician Assistant

## 2016-07-25 MED ORDER — HYDROCODONE-ACETAMINOPHEN 10-325 MG PO TABS: 1.0000 | ORAL_TABLET | Freq: Four times a day (QID) | ORAL | 0 refills | Status: DC | PRN

## 2016-08-01 ENCOUNTER — Inpatient Hospital Stay: Admission: RE | Admit: 2016-08-01 | Payer: Managed Care, Other (non HMO) | Source: Ambulatory Visit

## 2016-08-03 ENCOUNTER — Other Ambulatory Visit: Payer: Self-pay | Admitting: Physician Assistant

## 2016-08-04 ENCOUNTER — Ambulatory Visit
Admission: RE | Admit: 2016-08-04 | Discharge: 2016-08-04 | Disposition: A | Discharge status: Home / Self Care | Payer: Managed Care, Other (non HMO) | Source: Ambulatory Visit | Attending: Physician Assistant | Admitting: Physician Assistant

## 2016-08-04 ENCOUNTER — Telehealth: Payer: Self-pay

## 2016-08-04 DIAGNOSIS — M62838 Other muscle spasm: Secondary | ICD-10-CM

## 2016-08-04 DIAGNOSIS — M542 Cervicalgia: Secondary | ICD-10-CM

## 2016-08-04 DIAGNOSIS — R202 Paresthesia of skin: Secondary | ICD-10-CM

## 2016-08-04 MED ORDER — CYCLOBENZAPRINE HCL ER 30 MG PO CP24: 30.0000 mg | ORAL_CAPSULE | Freq: Every day | ORAL | 2 refills | Status: DC | PRN

## 2016-08-04 MED ORDER — HYDROCODONE-ACETAMINOPHEN 10-325 MG PO TABS: 1.0000 | ORAL_TABLET | Freq: Four times a day (QID) | ORAL | 0 refills | Status: DC | PRN

## 2016-08-04 NOTE — Telephone Encounter (Signed)
Patient is being managed by PA-Weber for chronic neck and back pain. Last office visit was 07/09/2016 and I believe MRI is pending. PT was also in treatment plan. I will forward this to PA-Weber for her review.

## 2016-08-04 NOTE — Telephone Encounter (Signed)
Rx ready for patient to pick up 

## 2016-08-04 NOTE — Telephone Encounter (Signed)
Patient called to seek advise from Weber.  He states that his MRI was pushed from Saturday to Tuesday night and he states that his pain level is at a 7.  He ran out of pain medication last night.  Please advise ° [phone] °

## 2016-08-04 NOTE — Telephone Encounter (Signed)
Patient called to seek advise from Weber.  He states that his MRI was pushed from Saturday to Tuesday night and he states that his pain level is at a 7.  He ran out of pain medication last night.  Please advise  [phone]

## 2016-08-04 NOTE — Telephone Encounter (Signed)
Done

## 2016-08-04 NOTE — Addendum Note (Signed)
Addended by: Morrell Riddle on: 08/04/2016 02:46 PM   Modules accepted: Orders

## 2016-08-05 ENCOUNTER — Ambulatory Visit (INDEPENDENT_AMBULATORY_CARE_PROVIDER_SITE_OTHER): Payer: Managed Care, Other (non HMO) | Admitting: Physician Assistant

## 2016-08-05 ENCOUNTER — Encounter: Payer: Self-pay | Admitting: Physician Assistant

## 2016-08-05 VITALS — BP 136/82 | HR 82 | Temp 98.9°F | Resp 17 | Ht 68.0 in | Wt 296.0 lb

## 2016-08-05 DIAGNOSIS — G959 Disease of spinal cord, unspecified: Secondary | ICD-10-CM | POA: Diagnosis not present

## 2016-08-05 DIAGNOSIS — M4802 Spinal stenosis, cervical region: Secondary | ICD-10-CM | POA: Insufficient documentation

## 2016-08-05 MED ORDER — PREDNISONE 20 MG PO TABS: ORAL_TABLET | ORAL | 0 refills | Status: DC

## 2016-08-05 NOTE — Patient Instructions (Addendum)
  HOLD the diclofenac while you are taking the prednisone.   IF you received an x-ray today, you will receive an invoice from [facility]. Please contact [facility] at [phone] with questions or concerns regarding your invoice.   IF you received labwork today, you will receive an invoice from [facility]. Please contact [facility] at [phone] with questions or concerns regarding your invoice.   Our billing staff will not be able to assist you with questions regarding bills from these companies.  You will be contacted with the lab results as soon as they are available. The fastest way to get your results is to activate your My Chart account. Instructions are located on the last page of this paperwork. If you have not heard from [place] regarding the results in 2 weeks, please contact this office.

## 2016-08-05 NOTE — Progress Notes (Signed)
Patient ID: Brandon [name], male    DOB: 10/13/1970, 46 y.o.   MRN: [id number]  PCP: Virgilio Belling  Chief Complaint  Patient presents with  . Follow-up    neck pain     Subjective:   Presents for review of his recent MRI. He is accompanied by his wife.  He had an MRI of the c-spine yesterday. Motion degraded examination. 6 x 4 x 13 mm LEFT central to subarticular C4-5 disc extrusion appears acute. Additional small central C5-6 disc extrusion. Moderate canal stenosis C4-5, mild at C5-6. Neural foraminal narrowing C4-5 through C6-7: Severe on the LEFT at C4-5, moderate to severe on the LEFT at C5-6.  Reports that the current dose of hydrocodone is working, but he understands that it is "a bandaid." Ice application directly on the c-spine helps some.  5 nights ago he developed pins and needles in the LEFT shoulder, then sharp shooting pain down the spine, then "massive twitching throughout the body." this occurred x 4 over the course of an hour, each episode lasting about 1 minute, followed by 4-5 minutes of extreme fatigue/somnolence. The following day, "I felt like I'd been hit by a truck." Has not recurred. He had episodes like this years ago associated with muscle relaxant medications.   Review of Systems     Patient Active Problem List   Diagnosis Date Noted  . Hyperinsulinemia 10/15/2015  . Hypertriglyceridemia 10/15/2015  . Benign essential HTN 12/05/2014  . Elevated cholesterol 12/05/2014  . Macroglossia 09/05/2014  . Severe obesity (BMI >= 40) (HCC) 09/05/2014  . ADEM (acute disseminated encephalomyelitis) (postinfectious) 09/05/2014  . Anxiety state, unspecified 12/11/2013  . Motor tic disorder 12/11/2013  . OSA on CPAP 10/04/2013  . Hypersomnia with sleep apnea, unspecified 10/04/2013  . High frequency hearing loss 08/24/2013  . Peripheral neuropathy (HCC) 04/19/2013  . Abnormal gait 10/19/2012  . Cervical myelopathy (HCC) 10/19/2012  . Esophageal  reflux 07/27/2011  . Obesity, Class III, BMI 40-49.9 (morbid obesity) (HCC) 07/22/2011  . ADD (attention deficit disorder) 07/22/2011  . ADEM (acute disseminated encephalomyelitis) 07/03/2011  . Splenomegaly 07/03/2011     Prior to Admission medications   Medication Sig Start Date End Date Taking? Authorizing Provider  aspirin 81 MG tablet Take 81 mg by mouth daily. Reported on 10/15/2015   Yes Historical Provider, MD  atorvastatin (LIPITOR) 10 MG tablet TAKE 1 TABLET (10 MG TOTAL) BY MOUTH DAILY 06/13/15  Yes Morrell Riddle, PA-C  cloNIDine (CATAPRES) 0.1 MG tablet INCREASE AS TOLERATED TO 1 TABLET BY MOUTH 3 TIMES A DAY 02/05/15  Yes Historical Provider, MD  cyclobenzaprine (AMRIX) 30 MG 24 hr capsule Take 1 capsule (30 mg total) by mouth daily as needed. 08/04/16  Yes Morrell Riddle, PA-C  diclofenac (VOLTAREN) 75 MG EC tablet TAKE 1 TABLET (75 MG TOTAL) BY MOUTH 2 (TWO) TIMES DAILY. 03/02/16  Yes Morrell Riddle, PA-C  diclofenac (VOLTAREN) 75 MG EC tablet TAKE 1 TABLET (75 MG TOTAL) BY MOUTH 2 (TWO) TIMES DAILY. 07/16/16  Yes Morrell Riddle, PA-C  gabapentin (NEURONTIN) 300 MG capsule Take 2 capsules (600 mg total) by mouth 2 (two) times daily. 07/18/15  Yes Morrell Riddle, PA-C  HYDROcodone-acetaminophen (NORCO) 10-325 MG tablet Take 1 tablet by mouth every 6 (six) hours as needed. 08/04/16  Yes Morrell Riddle, PA-C  lisinopril-hydrochlorothiazide (PRINZIDE,ZESTORETIC) 10-12.5 MG tablet TAKE 1 TABLET BY MOUTH EVERY DAY 06/30/16  Yes Falcon Mccaskey, PA-C  metaxalone (SKELAXIN) 800 MG  tablet Take 0.5-1 tablets (400-800 mg total) by mouth daily. 07/09/16  Yes [name] Harvie Bridge, PA-C  metFORMIN (GLUCOPHAGE-XR) 500 MG 24 hr tablet TAKE 2 TABLETS (1,000 MG TOTAL) BY MOUTH DAILY WITH BREAKFAST 04/19/16  Yes Joselin Crandell, PA-C  triamcinolone (KENALOG) 0.025 % cream APPLY TO AFFECTED AREA TWICE A DAY 12/07/15  Yes Toy Eisemann, PA-C  WELCHOL 625 MG tablet TAKE 6 TABLETS (3,750 MG TOTAL) BY MOUTH DAILY WITH BREAKFAST.  11/05/15  Yes Morrell Riddle, PA-C     Allergies  Allergen Reactions  . Penicillin G Anaphylaxis    Gets extremely high fevers.  . Sulfa Antibiotics Other (See Comments)    It will kill him  . Amoxicillin   . Cephalosporins Other (See Comments)    Reaction unspecified.  [name] Penicillins Other (See Comments)    fever  . Quinolones Other (See Comments)    Reaction unspecified  . Sulfamethoxazole Swelling       Objective:  Physical Exam  Constitutional: He is oriented to person, place, and time. He appears well-developed and well-nourished. He is active and cooperative. No distress.  BP 136/82 (BP Location: Right Arm, Patient Position: Sitting, Cuff Size: Normal)   Pulse 82   Temp 98.9 F (37.2 C) (Oral)   Resp 17   Ht 5\' 8"  (1.727 m)   Wt 296 lb (134.3 kg)   SpO2 97%   BMI 45.01 kg/m    Eyes: Conjunctivae are normal.  Pulmonary/Chest: Effort normal.  Neurological: He is alert and oriented to person, place, and time.  Psychiatric: He has a normal mood and affect. His speech is normal and behavior is normal.  Quite verbose, his baseline           Assessment & Plan:   1. Cervical myelopathy (HCC) 2. Spinal stenosis in cervical region Referral. Prednisone taper. He has the prescription for hydrocodone that Ms. Weber wrote yesterday.  - Ambulatory referral to Neurosurgery - predniSONE (DELTASONE) 20 MG tablet; Take 3 PO QAM x3days, 2 PO QAM x3days, 1 PO QAM x3days  Dispense: 18 tablet; Refill: 0   Fernande Bras, PA-C Physician Assistant-Certified Primary Care at [facility]

## 2016-08-05 NOTE — Progress Notes (Signed)
Patient ID: Brandon [name], male    DOB: 11/30/1970, 46 y.o.   MRN: [id number]  PCP: Virgilio Belling  Chief Complaint  Patient presents with  . Follow-up    neck pain     Subjective:   Presents for evaluation of neck pain.  Pt is a 46 yo male who presents for follow up of neck and left arm pain x 1.5-2 months. He was seen on 07/03/16 and again on 07/09/16 for the same neck pain. Cervical spine xrays and left shoulder xray were normal or unchanged from previous imaging. He has been taking gabapentin, amrix, metaxalone, and norco. He states that he has been taking his medications as directed, except for his metformin for 2 days due to anorexia. He reports to changes in his symptoms. He states that he has been using ice directly on his spine for relief of pain and hot showers to relax his sore muscles. Pt is willing to try PT.  An MRI on 08/04/16 revealed moderate to severe neural foraminal narrowing of LEFT C4-5 through C6-7, LEFT C4-5 and C5-6 disc extrusion, and moderate canal stenosis C4-5, mild at C5-6.  Pt also complains of bright red blood in his stool x 3-4 days. He has a history of hemorrhoids. He has tried stool softeners with some relief.    Review of Systems Const: Denies fever, chills, fatigue, or weight changes. Pulm: Denies cough or SOB. CV: RRR/ No M/R/G. Abd: Denies abdominal pain, nausea, vomiting, diarrhea, or constipation.    Patient Active Problem List   Diagnosis Date Noted  . Hyperinsulinemia 10/15/2015  . Hypertriglyceridemia 10/15/2015  . Benign essential HTN 12/05/2014  . Elevated cholesterol 12/05/2014  . Macroglossia 09/05/2014  . Severe obesity (BMI >= 40) (HCC) 09/05/2014  . ADEM (acute disseminated encephalomyelitis) (postinfectious) 09/05/2014  . Anxiety state, unspecified 12/11/2013  . Motor tic disorder 12/11/2013  . OSA on CPAP 10/04/2013  . Hypersomnia with sleep apnea, unspecified 10/04/2013  . High frequency hearing loss 08/24/2013    . Peripheral neuropathy (HCC) 04/19/2013  . Abnormal gait 10/19/2012  . Cervical myelopathy (HCC) 10/19/2012  . Esophageal reflux 07/27/2011  . Obesity, Class III, BMI 40-49.9 (morbid obesity) (HCC) 07/22/2011  . ADD (attention deficit disorder) 07/22/2011  . ADEM (acute disseminated encephalomyelitis) 07/03/2011  . Splenomegaly 07/03/2011     Prior to Admission medications   Medication Sig Start Date End Date Taking? Authorizing Provider  aspirin 81 MG tablet Take 81 mg by mouth daily. Reported on 10/15/2015   Yes Historical Provider, MD  atorvastatin (LIPITOR) 10 MG tablet TAKE 1 TABLET (10 MG TOTAL) BY MOUTH DAILY 06/13/15  Yes Morrell Riddle, PA-C  cloNIDine (CATAPRES) 0.1 MG tablet INCREASE AS TOLERATED TO 1 TABLET BY MOUTH 3 TIMES A DAY 02/05/15  Yes Historical Provider, MD  cyclobenzaprine (AMRIX) 30 MG 24 hr capsule Take 1 capsule (30 mg total) by mouth daily as needed. 08/04/16  Yes Morrell Riddle, PA-C  diclofenac (VOLTAREN) 75 MG EC tablet TAKE 1 TABLET (75 MG TOTAL) BY MOUTH 2 (TWO) TIMES DAILY. 03/02/16  Yes Morrell Riddle, PA-C  diclofenac (VOLTAREN) 75 MG EC tablet TAKE 1 TABLET (75 MG TOTAL) BY MOUTH 2 (TWO) TIMES DAILY. 07/16/16  Yes Morrell Riddle, PA-C  gabapentin (NEURONTIN) 300 MG capsule Take 2 capsules (600 mg total) by mouth 2 (two) times daily. 07/18/15  Yes Morrell Riddle, PA-C  HYDROcodone-acetaminophen (NORCO) 10-325 MG tablet Take 1 tablet by mouth every 6 (six) hours  as needed. 08/04/16  Yes [name] Harvie Bridge, PA-C  lisinopril-hydrochlorothiazide (PRINZIDE,ZESTORETIC) 10-12.5 MG tablet TAKE 1 TABLET BY MOUTH EVERY DAY 06/30/16  Yes Chelle Jeffery, PA-C  metaxalone (SKELAXIN) 800 MG tablet Take 0.5-1 tablets (400-800 mg total) by mouth daily. 07/09/16  Yes [name] Harvie Bridge, PA-C  metFORMIN (GLUCOPHAGE-XR) 500 MG 24 hr tablet TAKE 2 TABLETS (1,000 MG TOTAL) BY MOUTH DAILY WITH BREAKFAST 04/19/16  Yes Chelle Jeffery, PA-C  triamcinolone (KENALOG) 0.025 % cream APPLY TO AFFECTED AREA  TWICE A DAY 12/07/15  Yes Chelle Jeffery, PA-C  WELCHOL 625 MG tablet TAKE 6 TABLETS (3,750 MG TOTAL) BY MOUTH DAILY WITH BREAKFAST. 11/05/15  Yes Morrell Riddle, PA-C     Allergies  Allergen Reactions  . Penicillin G Anaphylaxis    Gets extremely high fevers.  . Sulfa Antibiotics Other (See Comments)    It will kill him  . Amoxicillin   . Cephalosporins Other (See Comments)    Reaction unspecified.  [name] Penicillins Other (See Comments)    fever  . Quinolones Other (See Comments)    Reaction unspecified  . Sulfamethoxazole Swelling       Objective:  Physical Exam         Assessment & Plan:   1. Cervical myelopathy (HCC) 2. Spinal stenosis in cervical region - Ambulatory referral to Neurosurgery - predniSONE (DELTASONE) 20 MG tablet; Take 3 PO QAM x3days, 2 PO QAM x3days, 1 PO QAM x3days  Dispense: 18 tablet; Refill: 0  Georgiana Spinner, PA-S

## 2016-08-06 ENCOUNTER — Ambulatory Visit: Payer: Managed Care, Other (non HMO) | Admitting: Physician Assistant

## 2016-08-11 ENCOUNTER — Encounter: Payer: Self-pay | Admitting: Physician Assistant

## 2016-08-11 MED ORDER — HYDROCODONE-ACETAMINOPHEN 10-325 MG PO TABS: 1.0000 | ORAL_TABLET | Freq: Four times a day (QID) | ORAL | 0 refills | Status: DC | PRN

## 2016-08-11 NOTE — Telephone Encounter (Signed)
Rx ready for pick up - pt knows through [employer]

## 2016-08-12 ENCOUNTER — Other Ambulatory Visit: Payer: Self-pay | Admitting: Physician Assistant

## 2016-08-25 ENCOUNTER — Other Ambulatory Visit: Payer: Self-pay | Admitting: Physician Assistant

## 2016-08-26 NOTE — Telephone Encounter (Signed)
Confirm with pt the MyChart Msg received and the time frame for refills is 48-72 hours.

## 2016-08-26 NOTE — Telephone Encounter (Signed)
Pt calling about med refill NORCO

## 2016-08-27 ENCOUNTER — Emergency Department (HOSPITAL_BASED_OUTPATIENT_CLINIC_OR_DEPARTMENT_OTHER)
Admission: EM | Admit: 2016-08-27 | Discharge: 2016-08-27 | Disposition: A | Discharge status: Home / Self Care | Payer: Managed Care, Other (non HMO) | Attending: Emergency Medicine | Admitting: Emergency Medicine

## 2016-08-27 ENCOUNTER — Telehealth: Payer: Self-pay

## 2016-08-27 ENCOUNTER — Encounter (HOSPITAL_BASED_OUTPATIENT_CLINIC_OR_DEPARTMENT_OTHER): Payer: Self-pay | Admitting: Emergency Medicine

## 2016-08-27 DIAGNOSIS — M542 Cervicalgia: Secondary | ICD-10-CM | POA: Insufficient documentation

## 2016-08-27 DIAGNOSIS — K5900 Constipation, unspecified: Secondary | ICD-10-CM | POA: Diagnosis not present

## 2016-08-27 DIAGNOSIS — Z87891 Personal history of nicotine dependence: Secondary | ICD-10-CM | POA: Diagnosis not present

## 2016-08-27 DIAGNOSIS — I10 Essential (primary) hypertension: Secondary | ICD-10-CM | POA: Diagnosis not present

## 2016-08-27 DIAGNOSIS — Z79899 Other long term (current) drug therapy: Secondary | ICD-10-CM | POA: Insufficient documentation

## 2016-08-27 DIAGNOSIS — R200 Anesthesia of skin: Secondary | ICD-10-CM | POA: Diagnosis not present

## 2016-08-27 DIAGNOSIS — Z7984 Long term (current) use of oral hypoglycemic drugs: Secondary | ICD-10-CM | POA: Insufficient documentation

## 2016-08-27 DIAGNOSIS — R110 Nausea: Secondary | ICD-10-CM | POA: Insufficient documentation

## 2016-08-27 DIAGNOSIS — Z7982 Long term (current) use of aspirin: Secondary | ICD-10-CM | POA: Diagnosis not present

## 2016-08-27 DIAGNOSIS — Z760 Encounter for issue of repeat prescription: Secondary | ICD-10-CM | POA: Diagnosis present

## 2016-08-27 MED ORDER — HYDROCODONE-ACETAMINOPHEN 5-325 MG PO TABS: 2.0000 | ORAL_TABLET | Freq: Four times a day (QID) | ORAL | 0 refills | Status: DC | PRN

## 2016-08-27 MED ORDER — HYDROCODONE-ACETAMINOPHEN 5-325 MG PO TABS
2.0000 | ORAL_TABLET | Freq: Once | ORAL | Status: AC
  Administered 2016-08-27: 2 via ORAL
  Filled 2016-08-27: qty 2

## 2016-08-27 NOTE — ED Triage Notes (Signed)
Patient reports that he has been out of his pain medications since yesterday. The patient reports that he went to his MD earlier today and they were backed up or was unable to see her ( the stories changed during triage) and was told to come here because we may be able to help him and refill the rx.

## 2016-08-27 NOTE — Telephone Encounter (Signed)
Patient is calling to get a refill of his hydrocodone 10-325 medication.  He states that he has agreed to undergo surgery and is waiting on a call back to schedule his procedure. Pt is in immense pain.  Please advise   [phone]

## 2016-08-27 NOTE — Telephone Encounter (Signed)
Pt came into office needing to get rx from [name] but it was not ready as of yet he wantd to wait for sarah until she was done with paitents  and we did advice patient that she was unavailable and  With other paitent, we could put in a new message and call with response. He stated that he was not able to wait much longer due to the pain and we suggested that he go to a different location  Best number [phone]

## 2016-08-27 NOTE — Telephone Encounter (Signed)
Pt did get one day of pain meds from an office in [place] as an emergency dose. Is still in need of script from Brandon [name].

## 2016-08-27 NOTE — Discharge Instructions (Signed)
Please take Norco as needed for pain. Please return to your primary care provider tomorrow regarding today's visit.  When should I call 911 or go to the emergency room? You feel dizzy or you faint. You are very confused or disoriented. You repeatedly vomit. Your skin or lips turn pale or bluish in color. You have shortness of breath or you are breathing much more slowly than usual. You have a severe allergic reaction to your medicine. This includes: Developing tongue swelling. Having difficulty breathing.

## 2016-08-27 NOTE — ED Provider Notes (Signed)
[facility] Provider Note   CSN: [id number] Arrival date & time: 08/27/16  1746  By signing my name below, I, Linna Darner, attest that this documentation has been prepared under the direction and in the presence of Leatrice Parilla, [place]. Electronically Signed: Linna Darner, Scribe. 08/27/2016. 6:09 PM.  History   Chief Complaint Chief Complaint  Patient presents with  . Medication Refill    The history is provided by the patient. No language interpreter was used.     HPI Comments: Brandon [name] is a 46 y.o. male with PMHx including MS, spinal stenosis, HTN, and HLD who presents to the Emergency Department complaining of severe, stabbing cervical spine pain for a few weeks. Pt reports pain radiation down his left arm and some numbness in his left upper extremity. Pt also notes some nausea due to the severity of his pain. He states he had an appointment with a neurosurgeon two days ago and was advised to have cervical disc replacement surgery and has not yet scheduled a date. Pt has been taking hydrocodone 10-325 for his neck pain but notes there has been miscommunication with his PCP's office and he has not been able to get it filled for a few weeks. Pt reports some baseline constipation secondary to pain medication use. He wants a short course of pain medication here and plans to see his PCP tomorrow for a long-term prescription. Pt denies fever, chills, abdominal pain, diarrhea, or any other associated symptoms.  Per EMR, patient email his PCP on 2/6 and was planning on picking up a prescription of hydrocodone 10-325 (60 pills) that night but was unable to. He went to pick up the prescription tonight but the office was too busy and he was advised to go to an ED.  Per EMR, MRI viewed by PCP and showed normal results. PCP stated she would continue giving pain medications until pt saw neurosurgery.  Past Medical History:  Diagnosis Date  . Allergy   . Anxiety 2005  .  Depression 2005  . Headache(784.0)   . Hearing loss   . Hyperinsulinemia 07/04/11   "I'm on metformin cause I produce too much insulin"  . Hyperlipidemia   . Hypertension   . Mononucleosis, infectious, with hepatitis 06/2011  . MS (multiple sclerosis) (HCC)   . Nausea    occasional per history form 08/18/11  . Neuromuscular disorder (HCC)   . Pneumonia 2010  . Sleep apnea    uses CPAP at home  . Stroke (HCC) 2000  . TIA (transient ischemic attack) 2000   denies residual  . TIA (transient ischemic attack)     Patient Active Problem List   Diagnosis Date Noted  . Spinal stenosis in cervical region 08/05/2016  . Hyperinsulinemia 10/15/2015  . Hypertriglyceridemia 10/15/2015  . Benign essential HTN 12/05/2014  . Elevated cholesterol 12/05/2014  . Macroglossia 09/05/2014  . Severe obesity (BMI >= 40) (HCC) 09/05/2014  . ADEM (acute disseminated encephalomyelitis) (postinfectious) 09/05/2014  . Anxiety state, unspecified 12/11/2013  . Motor tic disorder 12/11/2013  . OSA on CPAP 10/04/2013  . Hypersomnia with sleep apnea, unspecified 10/04/2013  . High frequency hearing loss 08/24/2013  . Peripheral neuropathy (HCC) 04/19/2013  . Abnormal gait 10/19/2012  . Cervical myelopathy (HCC) 10/19/2012  . Esophageal reflux 07/27/2011  . Obesity, Class III, BMI 40-49.9 (morbid obesity) (HCC) 07/22/2011  . ADD (attention deficit disorder) 07/22/2011  . ADEM (acute disseminated encephalomyelitis) 07/03/2011  . Splenomegaly 07/03/2011    Past Surgical History:  Procedure Laterality Date  . CHOLECYSTECTOMY  08/02/2011   Procedure: LAPAROSCOPIC CHOLECYSTECTOMY;  Surgeon: Shelly Rubenstein, MD;  Location: [facility];  Service: General;;  . COLONOSCOPY  2003  . ESOPHAGOGASTRODUODENOSCOPY  2003  . ESOPHAGOGASTRODUODENOSCOPY  07/28/2011   Procedure: ESOPHAGOGASTRODUODENOSCOPY (EGD);  Surgeon: [name], MD;  Location: [facility] ENDOSCOPY;  Service: Endoscopy;  Laterality: N/A;  . MYRINGOTOMY  1979;  1980   bilaterally  . TONSILLECTOMY AND ADENOIDECTOMY  ~ 1980       Home Medications    Prior to Admission medications   Medication Sig Start Date End Date Taking? Authorizing Provider  aspirin 81 MG tablet Take 81 mg by mouth daily. Reported on 10/15/2015    Historical Provider, MD  atorvastatin (LIPITOR) 10 MG tablet TAKE 1 TABLET (10 MG TOTAL) BY MOUTH DAILY 06/13/15   Morrell Riddle, PA-C  cloNIDine (CATAPRES) 0.1 MG tablet INCREASE AS TOLERATED TO 1 TABLET BY MOUTH 3 TIMES A DAY 02/05/15   Historical Provider, MD  cyclobenzaprine (AMRIX) 30 MG 24 hr capsule Take 1 capsule (30 mg total) by mouth daily as needed. 08/04/16   Morrell Riddle, PA-C  diclofenac (VOLTAREN) 75 MG EC tablet TAKE 1 TABLET (75 MG TOTAL) BY MOUTH 2 (TWO) TIMES DAILY. 07/16/16   Morrell Riddle, PA-C  gabapentin (NEURONTIN) 300 MG capsule Take 2 capsules (600 mg total) by mouth 2 (two) times daily. 07/18/15   Morrell Riddle, PA-C  HYDROcodone-acetaminophen (NORCO/VICODIN) 5-325 MG tablet Take 2 tablets by mouth every 6 (six) hours as needed. 08/27/16   Naava Janeway [place], [place]  lisinopril-hydrochlorothiazide (PRINZIDE,ZESTORETIC) 10-12.5 MG tablet TAKE 1 TABLET BY MOUTH EVERY DAY 08/13/16   Ofilia Neas, PA-C  metaxalone (SKELAXIN) 800 MG tablet Take 0.5-1 tablets (400-800 mg total) by mouth daily. 07/09/16   Morrell Riddle, PA-C  metFORMIN (GLUCOPHAGE-XR) 500 MG 24 hr tablet TAKE 2 TABLETS (1,000 MG TOTAL) BY MOUTH DAILY WITH BREAKFAST 04/19/16   Chelle Jeffery, PA-C  predniSONE (DELTASONE) 20 MG tablet Take 3 PO QAM x3days, 2 PO QAM x3days, 1 PO QAM x3days 08/05/16   Chelle Jeffery, PA-C  triamcinolone (KENALOG) 0.025 % cream APPLY TO AFFECTED AREA TWICE A DAY 12/07/15   Chelle Jeffery, PA-C  WELCHOL 625 MG tablet TAKE 6 TABLETS (3,750 MG TOTAL) BY MOUTH DAILY WITH BREAKFAST. 11/05/15   Morrell Riddle, PA-C    Family History Family History  Problem Relation Age of Onset  . Coronary artery disease Father   . Stroke Father   .  Diabetes Mother   . Cancer Maternal Grandmother     bone  . Cancer Other     Stomach -Great-great grandmother    Social History Social History  Substance Use Topics  . Smoking status: Former Smoker    Packs/day: 3.00    Years: 3.00    Types: Cigarettes    Quit date: 08/18/1987  . Smokeless tobacco: Never Used  . Alcohol use 0.0 oz/week     Comment: 07/04/11 "glass of wine or spirits once a month"     Allergies   Penicillin g; Sulfa antibiotics; Amoxicillin; Cephalosporins; Penicillins; Quinolones; and Sulfamethoxazole   Review of Systems Review of Systems  Constitutional: Negative for chills and fever.  Gastrointestinal: Positive for constipation (baseline) and nausea. Negative for abdominal pain and diarrhea.  Musculoskeletal: Positive for myalgias and neck pain.  Neurological: Positive for numbness.     Physical Exam Updated Vital Signs BP 147/89 (BP Location: Right Arm)   Pulse 82  Temp 98.5 F (36.9 C) (Oral)   Resp 18   Ht 5\' 8"  (1.727 m)   Wt 136.1 kg   SpO2 100%   BMI 45.61 kg/m   Physical Exam  Constitutional: He is oriented to person, place, and time. He appears well-developed and well-nourished. No distress.  HENT:  Head: Normocephalic and atraumatic.  Eyes: Conjunctivae and EOM are normal.  Neck: Neck supple. No tracheal deviation present.  Cardiovascular: Normal rate.   Pulmonary/Chest: Effort normal. No respiratory distress.  Musculoskeletal: Normal range of motion. He exhibits tenderness.  Limited ROM due to pain and discomfort. Tenderness to cervical neck.   Neurological: He is alert and oriented to person, place, and time.  Sensation intact bilaterally on upper extremities. Muscle strength 5/5 bilaterally on upper extremities.   Skin: Skin is warm and dry.  Psychiatric: He has a normal mood and affect. His behavior is normal.  Nursing note and vitals reviewed.    ED Treatments / Results  Labs (all labs ordered are listed, but only  abnormal results are displayed) Labs Reviewed - No data to display  EKG  EKG Interpretation None       Radiology No results found.  Procedures Procedures (including critical care time)  DIAGNOSTIC STUDIES: Oxygen Saturation is 100% on RA, normal by my interpretation.    COORDINATION OF CARE: 6:20 PM Discussed treatment plan with pt at bedside and pt agreed to plan.  Medications Ordered in ED Medications  HYDROcodone-acetaminophen (NORCO/VICODIN) 5-325 MG per tablet 2 tablet (2 tablets Oral Given 08/27/16 1820)     Initial Impression / Assessment and Plan / ED Course  I have reviewed the triage vital signs and the nursing notes.  Pertinent labs & imaging results that were available during my care of the patient were reviewed by me and considered in my medical decision making (see chart for details).    Patient with severe neck pain, left shoulder and left arm pain for more than 2 months and has a recent history of cervical spine disc extrusions, moderate canal stenosis, neural foraminal narrowing which is severe on the left C4-5 and moderate to severe on the left C5-6 diagnosed on MRI 08/04/2016. Patient's history is consistent with EMR telephone and email to medications from provider to him. MRI also shows these abnormal results. Patient went to her office today seeking his usual pain medications and had some issues with the wait and front office. He was told to go somewhere else for his medications if he could not wait any longer and was told by front office to go to the Ed. Patient given pain medications here in the ED. Patient will be given one day worth of pain medication to last him one night and be seen tomorrow by his primary provider. Patient instructed that he should not be coming to the emergency department for these complaints and is misuse of emergency department. Patient verbally understands. Patient seen by neurosurgery two days ago and will be scheduled a surgery date  at unknown date. No new symptoms today. I feel patient is safe for discharge at this time.  Patient is in no apparent distress at this time. Hemodynamically stable. Afebrile. Reasons to return to emergency are discussed. I have reviewed the [place] Controlled Substance Reporting System.   Final Clinical Impressions(s) / ED Diagnoses   Final diagnoses:  Neck pain, acute    New Prescriptions Discharge Medication List as of 08/27/2016  6:29 PM    START taking these medications  Details  HYDROcodone-acetaminophen (NORCO/VICODIN) 5-325 MG tablet Take 2 tablets by mouth every 6 (six) hours as needed., Starting Thu 08/27/2016, Print       I personally performed the services described in this documentation, which was scribed in my presence. The recorded information has been reviewed and is accurate.   [address] [place], [place] 08/27/16 1842    Benjiman Core, MD 08/28/16 0001

## 2016-08-28 ENCOUNTER — Telehealth: Payer: Self-pay

## 2016-08-28 ENCOUNTER — Encounter (INDEPENDENT_AMBULATORY_CARE_PROVIDER_SITE_OTHER): Payer: 59 | Admitting: Family Medicine

## 2016-08-28 MED ORDER — HYDROCODONE-ACETAMINOPHEN 10-325 MG PO TABS
1.0000 | ORAL_TABLET | Freq: Four times a day (QID) | ORAL | 0 refills | Status: DC | PRN
Start: 2016-08-28 — End: 2016-09-07

## 2016-08-28 NOTE — Patient Instructions (Signed)
     IF you received an x-ray today, you will receive an invoice from [facility]. Please contact [facility] at [phone] with questions or concerns regarding your invoice.   IF you received labwork today, you will receive an invoice from [facility]. Please contact [facility] at [phone] with questions or concerns regarding your invoice.   Our billing staff will not be able to assist you with questions regarding bills from these companies.  You will be contacted with the lab results as soon as they are available. The fastest way to get your results is to activate your My Chart account. Instructions are located on the last page of this paperwork. If you have not heard from us regarding the results in 2 weeks, please contact this office.     

## 2016-08-28 NOTE — Progress Notes (Signed)
This encounter was created in error - please disregard.

## 2016-08-28 NOTE — Telephone Encounter (Signed)
done

## 2016-08-28 NOTE — Telephone Encounter (Signed)
Pt was told to schd a visit but did not really need to be seen. We are voiding his visit but are unable to refund his copay at this moment // Almira Coaster collected it and is gone for the day. Informed pt that we will be able to refund when she is back in and will ensure that he gets this money back.

## 2016-08-28 NOTE — Telephone Encounter (Signed)
Pt is calling to see if [name] has filled his pain medicaiton refill and would like a call on his cell phone [phone] when ready to pick up

## 2016-08-31 ENCOUNTER — Encounter: Payer: Self-pay | Admitting: Physician Assistant

## 2016-08-31 NOTE — Telephone Encounter (Signed)
This is not a scheduling pool matter it is a billing matter. I will forward to [facility] but not sure when she is coming back in office.

## 2016-08-31 NOTE — Telephone Encounter (Signed)
Can someone please check to see that the patient has gotten this Rx that I have written for the patient.

## 2016-08-31 NOTE — Telephone Encounter (Signed)
I have sent the patient a mychart message.

## 2016-08-31 NOTE — Telephone Encounter (Signed)
I wrote the Rx for the patient - I am not sure the advice to go to another clinic to get pain medications when it is obvious that I have been writing them for him is what I want told to my patients.

## 2016-08-31 NOTE — Telephone Encounter (Signed)
Sorry, routed to wrong pool. I found a solution with the pt. Thank you!

## 2016-08-31 NOTE — Telephone Encounter (Signed)
I donot see where it was called in or picked up in the book, no rx at nurses desk? Want me to call in?

## 2016-09-02 ENCOUNTER — Other Ambulatory Visit: Payer: Self-pay | Admitting: Neurosurgery

## 2016-09-03 ENCOUNTER — Other Ambulatory Visit: Payer: Self-pay | Admitting: Physician Assistant

## 2016-09-05 NOTE — Telephone Encounter (Signed)
07/2016 last ov 10/2015 last labs

## 2016-09-07 ENCOUNTER — Other Ambulatory Visit: Payer: Self-pay | Admitting: Physician Assistant

## 2016-09-08 MED ORDER — HYDROCODONE-ACETAMINOPHEN 10-325 MG PO TABS: 1.0000 | ORAL_TABLET | Freq: Four times a day (QID) | ORAL | 0 refills | Status: DC | PRN

## 2016-09-08 NOTE — Telephone Encounter (Signed)
Pt knows through mychart 

## 2016-09-08 NOTE — Telephone Encounter (Signed)
08/28/16 last refill 07/2016 last ov

## 2016-09-17 ENCOUNTER — Ambulatory Visit (INDEPENDENT_AMBULATORY_CARE_PROVIDER_SITE_OTHER): Payer: 59 | Admitting: Physician Assistant

## 2016-09-17 ENCOUNTER — Encounter: Payer: Self-pay | Admitting: Physician Assistant

## 2016-09-17 VITALS — BP 136/80 | HR 74 | Temp 98.5°F | Resp 18 | Ht 68.0 in | Wt 298.0 lb

## 2016-09-17 DIAGNOSIS — K5900 Constipation, unspecified: Secondary | ICD-10-CM

## 2016-09-17 DIAGNOSIS — I10 Essential (primary) hypertension: Secondary | ICD-10-CM | POA: Diagnosis not present

## 2016-09-17 DIAGNOSIS — G63 Polyneuropathy in diseases classified elsewhere: Secondary | ICD-10-CM | POA: Diagnosis not present

## 2016-09-17 DIAGNOSIS — Z130 Encounter for screening for diseases of the blood and blood-forming organs and certain disorders involving the immune mechanism: Secondary | ICD-10-CM

## 2016-09-17 DIAGNOSIS — Z0000 Encounter for general adult medical examination without abnormal findings: Secondary | ICD-10-CM

## 2016-09-17 DIAGNOSIS — K219 Gastro-esophageal reflux disease without esophagitis: Secondary | ICD-10-CM | POA: Diagnosis not present

## 2016-09-17 DIAGNOSIS — M62838 Other muscle spasm: Secondary | ICD-10-CM

## 2016-09-17 DIAGNOSIS — M4802 Spinal stenosis, cervical region: Secondary | ICD-10-CM | POA: Diagnosis not present

## 2016-09-17 DIAGNOSIS — F411 Generalized anxiety disorder: Secondary | ICD-10-CM | POA: Diagnosis not present

## 2016-09-17 DIAGNOSIS — E781 Pure hyperglyceridemia: Secondary | ICD-10-CM

## 2016-09-17 DIAGNOSIS — Z1329 Encounter for screening for other suspected endocrine disorder: Secondary | ICD-10-CM

## 2016-09-17 DIAGNOSIS — Z9989 Dependence on other enabling machines and devices: Secondary | ICD-10-CM

## 2016-09-17 DIAGNOSIS — G0400 Acute disseminated encephalitis and encephalomyelitis, unspecified: Secondary | ICD-10-CM | POA: Diagnosis not present

## 2016-09-17 DIAGNOSIS — G4733 Obstructive sleep apnea (adult) (pediatric): Secondary | ICD-10-CM | POA: Diagnosis not present

## 2016-09-17 DIAGNOSIS — E66813 Obesity, class 3: Secondary | ICD-10-CM

## 2016-09-17 DIAGNOSIS — E161 Other hypoglycemia: Secondary | ICD-10-CM

## 2016-09-17 MED ORDER — LISINOPRIL-HYDROCHLOROTHIAZIDE 10-12.5 MG PO TABS: 1.0000 | ORAL_TABLET | Freq: Every day | ORAL | 1 refills | Status: DC

## 2016-09-17 MED ORDER — HYDROCODONE-ACETAMINOPHEN 10-325 MG PO TABS: 1.0000 | ORAL_TABLET | Freq: Four times a day (QID) | ORAL | 0 refills | Status: DC | PRN

## 2016-09-17 MED ORDER — METFORMIN HCL ER 500 MG PO TB24: 1000.0000 mg | ORAL_TABLET | Freq: Every day | ORAL | 1 refills | Status: DC

## 2016-09-17 MED ORDER — DOCUSATE SODIUM 100 MG PO CAPS: 200.0000 mg | ORAL_CAPSULE | Freq: Two times a day (BID) | ORAL | 1 refills | Status: DC

## 2016-09-17 MED ORDER — DICLOFENAC SODIUM 75 MG PO TBEC: 75.0000 mg | DELAYED_RELEASE_TABLET | Freq: Two times a day (BID) | ORAL | 1 refills | Status: DC

## 2016-09-17 MED ORDER — METAXALONE 800 MG PO TABS: 800.0000 mg | ORAL_TABLET | Freq: Every day | ORAL | 1 refills | Status: DC

## 2016-09-17 MED ORDER — GABAPENTIN 300 MG PO CAPS: 600.0000 mg | ORAL_CAPSULE | Freq: Three times a day (TID) | ORAL | Status: DC

## 2016-09-17 MED ORDER — CYCLOBENZAPRINE HCL ER 30 MG PO CP24: 30.0000 mg | ORAL_CAPSULE | Freq: Every day | ORAL | 5 refills | Status: DC | PRN

## 2016-09-17 MED ORDER — COLESEVELAM HCL 625 MG PO TABS: 1875.0000 mg | ORAL_TABLET | Freq: Every day | ORAL | 1 refills | Status: DC

## 2016-09-17 NOTE — Patient Instructions (Signed)
     IF you received an x-ray today, you will receive an invoice from [facility]. Please contact [facility] at [phone] with questions or concerns regarding your invoice.   IF you received labwork today, you will receive an invoice from [facility]. Please contact [facility] at [phone] with questions or concerns regarding your invoice.   Our billing staff will not be able to assist you with questions regarding bills from these companies.  You will be contacted with the lab results as soon as they are available. The fastest way to get your results is to activate your My Chart account. Instructions are located on the last page of this paperwork. If you have not heard from us regarding the results in 2 weeks, please contact this office.     

## 2016-09-17 NOTE — Progress Notes (Signed)
Brandon [name]  MRN: [id number] DOB: [birth year]  PCP: Elizabeth Sauer  Subjective:  Pt presents to clinic for a CPE.  He is doing well.  Schedule for surgery 4/18 for cervical decompression.  Pain medication is helping.  The skelaxin in the afternoon is bridging the gap from the Amrix at night for his muscle spasms.  He has had more cervical twitching from the ADEM recently. He is getting pain relief from the pain medication but he has noticed that he is having constipation - he has been using intermittent stool softners at home - when he gets constipated he does get some nauseated.  ADEM - sees neurologist in [place] for this  OSA - uses CPAP daily  Last dental exam: been a year or so Last vision exam: wears glasses - 12/17  Vaccinations - UTD      Typical meals for patient: 3 large meals and 4-5 snacks/small meals  Typical beverage choices: occ juice - water, unsweet tea once a day (hot tea) Exercises: walking once a week about 30 mins, no swimming due to neck Sleeps: sleeping well - worse with the neck recently 6-8 hrs a day - nap all day -   Patient Active Problem List   Diagnosis Date Noted  . Spinal stenosis in cervical region 08/05/2016  . Hyperinsulinemia 10/15/2015  . Hypertriglyceridemia 10/15/2015  . Benign essential HTN 12/05/2014  . Macroglossia 09/05/2014  . GAD (generalized anxiety disorder) 12/11/2013  . Motor tic disorder 12/11/2013  . OSA on CPAP 10/04/2013  . Hypersomnia with sleep apnea, unspecified 10/04/2013  . High frequency hearing loss 08/24/2013  . Peripheral neuropathy ([place]) 04/19/2013  . Abnormal gait 10/19/2012  . Cervical myelopathy ([place]) 10/19/2012  . Esophageal reflux 07/27/2011  . Obesity, Class III, BMI 40-49.9 (morbid obesity) ([place]) 07/22/2011  . ADD (attention deficit disorder) 07/22/2011  . ADEM (acute disseminated encephalomyelitis) 07/03/2011  . Splenomegaly 07/03/2011   Review of Systems  Constitutional: Negative.   HENT:  Negative.   Eyes: Negative.   Respiratory: Negative.   Cardiovascular: Negative.   Gastrointestinal: Positive for constipation. Negative for blood in stool.  Endocrine: Negative.   Genitourinary: Negative.   Musculoskeletal: Positive for gait problem (walks with cane normal for him) and neck pain.  Skin: Negative.   Allergic/Immunologic: Negative.   Hematological: Negative.   Psychiatric/Behavioral: Negative.      Current Outpatient Prescriptions on File Prior to Visit  Medication Sig Dispense Refill  . aspirin 81 MG tablet Take 81 mg by mouth daily. Reported on 10/15/2015    . cloNIDine (CATAPRES) 0.1 MG tablet INCREASE AS TOLERATED TO 1 TABLET BY MOUTH 3 TIMES A DAY     No current facility-administered medications on file prior to visit.     Allergies  Allergen Reactions  . Penicillin G Anaphylaxis    Gets extremely high fevers.  . Sulfa Antibiotics Other (See Comments)    It will kill him  . Amoxicillin   . Cephalosporins Other (See Comments)    Reaction unspecified.  [name] Penicillins Other (See Comments)    fever  . Quinolones Other (See Comments)    Reaction unspecified  . Sulfamethoxazole Swelling    Social History   Social History  . Marital status: Single    Spouse name: N/A  . Number of children: 0  . Years of education: college   Occupational History  . disabled     Social History Main Topics  . Smoking status: Former Smoker    Packs/day:  3.00    Years: 3.00    Types: Cigarettes    Quit date: 08/18/1987  . Smokeless tobacco: Never Used  . Alcohol use 0.0 oz/week     Comment: 07/04/11 "glass of wine or spirits once a month"  . Drug use: No  . Sexual activity: Yes    Partners: Female   Other Topics Concern  . None   Social History Narrative   Lives in [place] with 2 domestic male partners "his family unit"   Caffeine: minimal    Past Surgical History:  Procedure Laterality Date  . CHOLECYSTECTOMY  08/02/2011   Procedure: LAPAROSCOPIC  CHOLECYSTECTOMY;  Surgeon: Harl Bowie, MD;  Location: [facility];  Service: General;;  . COLONOSCOPY  2003  . ESOPHAGOGASTRODUODENOSCOPY  2003  . ESOPHAGOGASTRODUODENOSCOPY  07/28/2011   Procedure: ESOPHAGOGASTRODUODENOSCOPY (EGD);  Surgeon: Scarlette Shorts, MD;  Location[facility] ENDOSCOPY;  Service: Endoscopy;  Laterality: N/A;  . MYRINGOTOMY  1979; 1980   bilaterally  . TONSILLECTOMY AND ADENOIDECTOMY  ~ 1980    Family History  Problem Relation Age of Onset  . Coronary artery disease Father   . Stroke Father   . Diabetes Mother   . Cancer Maternal Grandmother     bone  . Cancer Other     Stomach -Great-great grandmother     Objective:  BP 136/80   Pulse 74   Temp 98.5 F (36.9 C) (Oral)   Resp 18   Ht 5' 8"  (1.727 m)   Wt 298 lb (135.2 kg)   SpO2 95%   BMI 45.31 kg/m   Physical Exam  Constitutional: He is oriented to person, place, and time and well-developed, well-nourished, and in no distress.  HENT:  Head: Normocephalic and atraumatic.  Right Ear: Hearing, tympanic membrane, external ear and ear canal normal.  Left Ear: Hearing, tympanic membrane, external ear and ear canal normal.  Nose: Nose normal.  Mouth/Throat: Uvula is midline, oropharynx is clear and moist and mucous membranes are normal.  Eyes: Conjunctivae and EOM are normal. Pupils are equal, round, and reactive to light.  Neck: Trachea normal and normal range of motion. Neck supple. No thyroid mass and no thyromegaly present.  Cardiovascular: Normal rate, regular rhythm and normal heart sounds.   No murmur heard. Pulmonary/Chest: Effort normal and breath sounds normal.  Abdominal: Soft. Bowel sounds are normal.  Musculoskeletal: Normal range of motion.  Neurological: He is alert and oriented to person, place, and time.  Walks with cane  Skin: Skin is warm and dry.  Psychiatric: Mood, memory, affect and judgment normal.    Visual Acuity Screening   Right eye Left eye Both eyes  Without correction:       With correction: 20/13-1 20/13 20/13    Assessment and Plan :  Annual physical exam - anticipatory guidance - suggested that he try to do some type of exercise - walking in the pool would be good for him  Benign essential HTN - Plan: lisinopril-hydrochlorothiazide (PRINZIDE,ZESTORETIC) 10-12.5 MG tablet, EKG 12-Lead, CANCELED: EKG 12-Lead - continue current medications - pt will RTC for an [place] next week since the machine was not working yesterday.  He will need this for the likely surgical clearance that he will need to have prior to his surgery.  OSA on CPAP - continue use  Gastroesophageal reflux disease without esophagitis  Hyperinsulinemia - Plan: Hemoglobin A1c, metFORMIN (GLUCOPHAGE-XR) 500 MG 24 hr tablet  ADEM (acute disseminated encephalomyelitis)  Polyneuropathy associated with underlying disease ([place]) - Plan: gabapentin (  NEURONTIN) 300 MG capsule - continue tid dosing as this is helping  Obesity, Class III, BMI 40-49.9 (morbid obesity) (HCC) - encouraged physical activty  Hypertriglyceridemia - Plan: Lipid panel, CMP14+EGFR, colesevelam (WELCHOL) 625 MG tablet - continue medications - adjust if needed based on lab results  Spinal stenosis in cervical region - Plan: metaxalone (SKELAXIN) 800 MG tablet, HYDROcodone-acetaminophen (NORCO) 10-325 MG tablet, diclofenac (VOLTAREN) 75 MG EC tablet  GAD (generalized anxiety disorder)  Screening for deficiency anemia - Plan: CBC with Differential/Platelet  Screening for thyroid disorder - Plan: TSH  Muscle spasm - Plan: metaxalone (SKELAXIN) 800 MG tablet, cyclobenzaprine (AMRIX) 30 MG 24 hr capsule  Constipation, unspecified constipation type - Plan: docusate sodium (COLACE) 100 MG capsule - try to increase water consumption   Windell Hummingbird PA-C  [facility] 09/18/2016 10:35 AM

## 2016-09-18 LAB — CMP14+EGFR
ALT: 98 IU/L — ABNORMAL HIGH (ref 0–44)
AST: 42 IU/L — ABNORMAL HIGH (ref 0–40)
Albumin/Globulin Ratio: 1.8 (ref 1.2–2.2)
Albumin: 4.2 g/dL (ref 3.5–5.5)
Alkaline Phosphatase: 57 IU/L (ref 39–117)
BUN/Creatinine Ratio: 16 (ref 9–20)
BUN: 13 mg/dL (ref 6–24)
Bilirubin Total: 0.3 mg/dL (ref 0.0–1.2)
CO2: 24 mmol/L (ref 18–29)
Calcium: 9.2 mg/dL (ref 8.7–10.2)
Chloride: 102 mmol/L (ref 96–106)
Creatinine, Ser: 0.81 mg/dL (ref 0.76–1.27)
GFR calc Af Amer: 124 mL/min/{1.73_m2} (ref >59)
GFR calc non Af Amer: 107 mL/min/{1.73_m2} (ref >59)
Globulin, Total: 2.3 g/dL (ref 1.5–4.5)
Glucose: 88 mg/dL (ref 65–99)
Potassium: 3.9 mmol/L (ref 3.5–5.2)
Sodium: 145 mmol/L — ABNORMAL HIGH (ref 134–144)
Total Protein: 6.5 g/dL (ref 6.0–8.5)

## 2016-09-18 LAB — LIPID PANEL
Chol/HDL Ratio: 7.3 ratio units — ABNORMAL HIGH (ref 0.0–5.0)
Cholesterol, Total: 191 mg/dL (ref 100–199)
HDL: 26 mg/dL — ABNORMAL LOW (ref >39)
Triglycerides: 630 mg/dL (ref 0–149)

## 2016-09-18 LAB — HEMOGLOBIN A1C
Est. average glucose Bld gHb Est-mCnc: 111 mg/dL
Hgb A1c MFr Bld: 5.5 % (ref 4.8–5.6)

## 2016-09-18 LAB — CBC WITH DIFFERENTIAL/PLATELET
Basophils Absolute: 0 10*3/uL (ref 0.0–0.2)
Basos: 0 %
EOS (ABSOLUTE): 0.1 10*3/uL (ref 0.0–0.4)
Eos: 2 %
Hematocrit: 44.6 % (ref 37.5–51.0)
Hemoglobin: 15.1 g/dL (ref 13.0–17.7)
Immature Grans (Abs): 0 10*3/uL (ref 0.0–0.1)
Immature Granulocytes: 0 %
Lymphocytes Absolute: 1.9 10*3/uL (ref 0.7–3.1)
Lymphs: 32 %
MCH: 28.2 pg (ref 26.6–33.0)
MCHC: 33.9 g/dL (ref 31.5–35.7)
MCV: 83 fL (ref 79–97)
Monocytes Absolute: 0.3 10*3/uL (ref 0.1–0.9)
Monocytes: 5 %
Neutrophils Absolute: 3.4 10*3/uL (ref 1.4–7.0)
Neutrophils: 61 %
Platelets: 158 10*3/uL (ref 150–379)
RBC: 5.36 x10E6/uL (ref 4.14–5.80)
RDW: 14.9 % (ref 12.3–15.4)
WBC: 5.8 10*3/uL (ref 3.4–10.8)

## 2016-09-18 LAB — TSH: TSH: 3.19 u[IU]/mL (ref 0.450–4.500)

## 2016-09-22 ENCOUNTER — Encounter: Payer: Self-pay | Admitting: Physician Assistant

## 2016-09-22 DIAGNOSIS — K760 Fatty (change of) liver, not elsewhere classified: Secondary | ICD-10-CM | POA: Insufficient documentation

## 2016-09-23 ENCOUNTER — Telehealth: Payer: Self-pay

## 2016-09-23 ENCOUNTER — Other Ambulatory Visit: Payer: Self-pay | Admitting: Physician Assistant

## 2016-09-23 NOTE — Telephone Encounter (Signed)
metaxolone not covered  Alternative flexeril or tizanidine

## 2016-09-23 NOTE — Telephone Encounter (Signed)
Appears like you discontinued this 3/15, and he is on welchol.  I am assuming you do not want him to take lipitor anymore but I am sending to you for your confirmation

## 2016-09-26 MED ORDER — METHOCARBAMOL 500 MG PO TABS: 500.0000 mg | ORAL_TABLET | Freq: Every day | ORAL | 1 refills | Status: DC

## 2016-09-26 NOTE — Telephone Encounter (Signed)
He is already on Amrix which is a form of flexeril - he has been on this in the past.  He will only need it for the next month - I will send in robaxin and see if they will cover that please let the patient know that I have sent a different medication to the pharmacy and if it does not work to let me know.

## 2016-09-26 NOTE — Telephone Encounter (Signed)
He chose to stop but he needs to be on due to his cholesterol.

## 2016-09-26 NOTE — Telephone Encounter (Signed)
lm

## 2016-10-12 ENCOUNTER — Encounter (HOSPITAL_COMMUNITY)
Admission: RE | Admit: 2016-10-12 | Discharge: 2016-10-12 | Disposition: A | Discharge status: Home / Self Care | Payer: 59 | Source: Ambulatory Visit | Attending: Neurosurgery | Admitting: Neurosurgery

## 2016-10-12 ENCOUNTER — Encounter (HOSPITAL_COMMUNITY): Payer: Self-pay

## 2016-10-12 DIAGNOSIS — R9431 Abnormal electrocardiogram [ECG] [EKG]: Secondary | ICD-10-CM | POA: Diagnosis not present

## 2016-10-12 DIAGNOSIS — Z01812 Encounter for preprocedural laboratory examination: Secondary | ICD-10-CM | POA: Diagnosis not present

## 2016-10-12 DIAGNOSIS — I10 Essential (primary) hypertension: Secondary | ICD-10-CM | POA: Insufficient documentation

## 2016-10-12 DIAGNOSIS — Z01810 Encounter for preprocedural cardiovascular examination: Secondary | ICD-10-CM | POA: Insufficient documentation

## 2016-10-12 HISTORY — DX: Other complications of anesthesia, initial encounter: T88.59XA

## 2016-10-12 HISTORY — DX: Acute disseminated encephalitis and encephalomyelitis, unspecified: G04.00

## 2016-10-12 HISTORY — DX: Failed or difficult intubation, initial encounter: T88.4XXA

## 2016-10-12 HISTORY — DX: Adverse effect of unspecified anesthetic, initial encounter: T41.45XA

## 2016-10-12 LAB — COMPREHENSIVE METABOLIC PANEL WITH GFR
ALT: 73 U/L — ABNORMAL HIGH (ref 17–63)
AST: 28 U/L (ref 15–41)
Albumin: 4.2 g/dL (ref 3.5–5.0)
Alkaline Phosphatase: 47 U/L (ref 38–126)
Anion gap: 13 (ref 5–15)
BUN: 12 mg/dL (ref 6–20)
CO2: 29 mmol/L (ref 22–32)
Calcium: 9.8 mg/dL (ref 8.9–10.3)
Chloride: 101 mmol/L (ref 101–111)
Creatinine, Ser: 0.78 mg/dL (ref 0.61–1.24)
GFR calc Af Amer: >60 mL/min (ref >60)
GFR calc non Af Amer: >60 mL/min (ref >60)
Glucose, Bld: 90 mg/dL (ref 65–99)
Potassium: 3.5 mmol/L (ref 3.5–5.1)
Sodium: 143 mmol/L (ref 135–145)
Total Bilirubin: 0.5 mg/dL (ref 0.3–1.2)
Total Protein: 6.9 g/dL (ref 6.5–8.1)

## 2016-10-12 LAB — CBC
HCT: 43.5 % (ref 39.0–52.0)
Hemoglobin: 14.8 g/dL (ref 13.0–17.0)
MCH: 28.1 pg (ref 26.0–34.0)
MCHC: 34 g/dL (ref 30.0–36.0)
MCV: 82.5 fL (ref 78.0–100.0)
Platelets: 151 10*3/uL (ref 150–400)
RBC: 5.27 MIL/uL (ref 4.22–5.81)
RDW: 13.8 % (ref 11.5–15.5)
WBC: 6.2 10*3/uL (ref 4.0–10.5)

## 2016-10-12 LAB — SURGICAL PCR SCREEN
MRSA, PCR: NEGATIVE
Staphylococcus aureus: NEGATIVE

## 2016-10-12 LAB — TYPE AND SCREEN
ABO/RH(D): O POS
Antibody Screen: NEGATIVE

## 2016-10-12 LAB — GLUCOSE, CAPILLARY: Glucose-Capillary: 101 mg/dL — ABNORMAL HIGH (ref 65–99)

## 2016-10-12 LAB — ABO/RH: ABO/RH(D): O POS

## 2016-10-12 NOTE — Pre-Procedure Instructions (Signed)
Brandon [name]  10/12/2016      [id number] - [place], [place] - [phone] [place] [address] Phone: [phone] Fax: [phone]    Your procedure is scheduled on April 18  Report to [facility] Admitting at 0745 A.M.  Call this number if you have problems the morning of surgery:  [phone]   Remember:  Do not eat food or drink liquids after midnight.    Take these medicines the morning of surgery with A SIP OF WATER cloNIDine (CATAPRES), cyclobenzaprine (AMRIX),  gabapentin (NEURONTIN), HYDROcodone-acetaminophen (NORCO),   Follow your doctors instructions regarding your Aspirin.  If no instructions were given by the doctor you will need to call the office to get instructions.  Your pre admission RN will also call for those instructions  Take all other medications as prescribed except 7 days prior to surgery STOP taking any Aleve, Naproxen, Ibuprofen, Motrin, Advil, Goody's, BC's, all herbal medications, fish oil, and all vitamins  WHAT DO I DO ABOUT MY DIABETES MEDICATION?   [name] Do not take oral diabetes medicines (pills) the morning of surgery. colesevelam [name]), metFORMIN (GLUCOPHAGE-XR)   How to Manage Your Diabetes Before and After Surgery  Why is it important to control my blood sugar before and after surgery? . Improving blood sugar levels before and after surgery helps healing and can limit problems. . A way of improving blood sugar control is eating a healthy diet by: o  Eating less sugar and carbohydrates o  Increasing activity/exercise o  Talking with your doctor about reaching your blood sugar goals . High blood sugars (greater than 180 mg/dL) can raise your risk of infections and slow your recovery, so you will need to focus on controlling your diabetes during the weeks before surgery. . Make sure that the doctor who takes care of your diabetes knows about your planned surgery including the date and location.  How  do I manage my blood sugar before surgery? . Check your blood sugar at least 4 times a day, starting 2 days before surgery, to make sure that the level is not too high or low. o Check your blood sugar the morning of your surgery when you wake up and every 2 hours until you get to the Short Stay unit. . If your blood sugar is less than 70 mg/dL, you will need to treat for low blood sugar: o Do not take insulin. o Treat a low blood sugar (less than 70 mg/dL) with  cup of clear juice (cranberry or apple), 4 glucose tablets, OR glucose gel. o Recheck blood sugar in 15 minutes after treatment (to make sure it is greater than 70 mg/dL). If your blood sugar is not greater than 70 mg/dL on recheck, call [phone] for further instructions. . Report your blood sugar to the short stay nurse when you get to Short Stay.  . If you are admitted to the hospital after surgery: o Your blood sugar will be checked by the staff and you will probably be given insulin after surgery (instead of oral diabetes medicines) to make sure you have good blood sugar levels. o The goal for blood sugar control after surgery is 80-180 mg/dL.    Do not wear jewelry  Do not wear lotions, powders, or cologne, or deoderant.  Do not shave 48 hours prior to surgery.  Men may shave face and neck.  Do not bring valuables to the hospital.  [facility] is not responsible  for any belongings or valuables.  Contacts, dentures or bridgework may not be worn into surgery.  Leave your suitcase in the car.  After surgery it may be brought to your room.  For patients admitted to the hospital, discharge time will be determined by your treatment team.  Patients discharged the day of surgery will not be allowed to drive home.    Special instructions:   [name]- Preparing For Surgery  Before surgery, you can play an important role. Because skin is not sterile, your skin needs to be as free of germs as possible. You can reduce the number  of germs on your skin by washing with CHG (chlorahexidine gluconate) Soap before surgery.  CHG is an antiseptic cleaner which kills germs and bonds with the skin to continue killing germs even after washing.  Please do not use if you have an allergy to CHG or antibacterial soaps. If your skin becomes reddened/irritated stop using the CHG.  Do not shave (including legs and underarms) for at least 48 hours prior to first CHG shower. It is OK to shave your face.  Please follow these instructions carefully.   1. Shower the NIGHT BEFORE SURGERY and the MORNING OF SURGERY with CHG.   2. If you chose to wash your hair, wash your hair first as usual with your normal shampoo.  3. After you shampoo, rinse your hair and body thoroughly to remove the shampoo.  4. Use CHG as you would any other liquid soap. You can apply CHG directly to the skin and wash gently with a scrungie or a clean washcloth.   5. Apply the CHG Soap to your body ONLY FROM THE NECK DOWN.  Do not use on open wounds or open sores. Avoid contact with your eyes, ears, mouth and genitals (private parts). Wash genitals (private parts) with your normal soap.  6. Wash thoroughly, paying special attention to the area where your surgery will be performed.  7. Thoroughly rinse your body with warm water from the neck down.  8. DO NOT shower/wash with your normal soap after using and rinsing off the CHG Soap.  9. Pat yourself dry with a CLEAN TOWEL.   10. Wear CLEAN PAJAMAS   11. Place CLEAN SHEETS on your bed the night of your first shower and DO NOT SLEEP WITH PETS.    Day of Surgery: Do not apply any deodorants/lotions. Please wear clean clothes to the hospital/surgery center.      Please read over the following fact sheets that you were given.

## 2016-10-12 NOTE — Progress Notes (Signed)
PCP - Benny Lennert Cardiologist - denies  Chest x-ray - not needed EKG - 10/12/16 Stress Test -denies  ECHO - 06/11/09 Cardiac Cath - denies  Sleep Study - 2012 [facility] Neurologic CPAP - variable settings  Fasting Blood Sugar - 90s-110s doesn't check on a regular basis Checks Blood Sugar __0___ times a day Doesn't check a lot  Sending to anesthesia for review of anesthesia records from a surgery at [facility] in 2005 with difficulty with tube    Patient denies shortness of breath, fever, cough and chest pain at PAT appointment   Patient verbalized understanding of instructions that was given to them at the PAT appointment. Patient expressed that there were no further questions.  Patient was also instructed that they will need to review over the PAT instructions again at home before the surgery.

## 2016-10-14 NOTE — Progress Notes (Signed)
Anesthesia Chart Review: Patient is a 46 year old male scheduled for C4-5, C5-6 ACDF on 10/21/16 by Dr. Lovell Sheehan. Patient goes by the name Brandon [name].  History includes former smoker (quit '89), HTN, MS, hyperinsulinemia, (on metformin), OSA (on CPAP), depression, anxiety, HLD, acute disseminated encephalomyelitis, hearing loss, TIA '00, T&A, cholecystectomy '13. BMI is consistent with morbid obesity.     For anesthesia history, he reported difficultly intubating and extubating due to "throat clinched around tube" with 2005 surgery that he thought was done at [facility] (although not operative note or encounter other than LP visit seen in Epic). He did have general anesthesia for 2013 cholecystectomy, but I do not see that his anesthesia record describes equipment used to establish an airway. Post-anesthesia note indicated that there was no apparent anesthesia complications. There was no documentation that intubation or extubation was difficult.   PCP is Benny Lennert, PA-C ([facility] at [facility]), last visit 09/17/16 for complete physical.   Meds include aspirin 81 mg (on hold), Lipitor, clonidine, WelChol, cyclobenzaprine, Neurontin, Norco, lisinopril-HCTZ, melatonin, Skelaxin, metformin, Robaxin  EKG 10/12/16: NSR, LAD. No significant change since last tracing 08/01/11.  Echo 06/11/09: Study Conclusions 1. Left ventricle: The cavity size was normal. Systolic function was  normal. The estimated ejection fraction was in the range of 60%  to 65%. Wall motion was normal; there were no regional wall  motion abnormalities. 2. Mitral valve: Mild regurgitation.  Preoperative labs noted. ALT 73, consistent with prior results (fatty liver noted on 08/02/11 U/S). CBC WNL. Last A1c on 09/17/16 was 5.5.  If no acute changes then I would anticipate that he could proceed as planned. Anesthesiologist to evaluate the day of surgery. He will need GETA.   Velna Ochs [facility] Short Stay  Center/Anesthesiology Phone [phone] 10/14/2016 2:24 PM

## 2016-10-20 MED ORDER — VANCOMYCIN HCL 10 G IV SOLR
1500.0000 mg | INTRAVENOUS | Status: AC
  Administered 2016-10-21: 1500 mg via INTRAVENOUS
  Filled 2016-10-20: qty 1500

## 2016-10-20 NOTE — Anesthesia Preprocedure Evaluation (Addendum)
Anesthesia Evaluation  Patient identified by MRN, date of birth, ID band Patient awake    Reviewed: Allergy & Precautions, NPO status , Patient's Chart, lab work & pertinent test results  History of Anesthesia Complications (+) DIFFICULT AIRWAY and history of anesthetic complications  Airway Mallampati: III  TM Distance: >3 FB Neck ROM: full    Dental  (+) Teeth Intact, Dental Advidsory Given   Pulmonary sleep apnea and Continuous Positive Airway Pressure Ventilation , former smoker,    breath sounds clear to auscultation       Cardiovascular hypertension, Pt. on medications  Rhythm:regular Rate:Normal     Neuro/Psych  Headaches, PSYCHIATRIC DISORDERS Anxiety Depression TIA Neuromuscular disease    GI/Hepatic Neg liver ROS, GERD  ,  Endo/Other  Morbid obesity  Renal/GU negative Renal ROS     Musculoskeletal   Abdominal (+) + obese,   Peds  Hematology negative hematology ROS (+)   Anesthesia Other Findings Large tongue.  Reproductive/Obstetrics                           Anesthesia Physical Anesthesia Plan  ASA: III  Anesthesia Plan: General   Post-op Pain Management:    Induction: Intravenous  Airway Management Planned: Oral ETT and Video Laryngoscope Planned  Additional Equipment:   Intra-op Plan:   Post-operative Plan: Extubation in OR  Informed Consent: I have reviewed the patients History and Physical, chart, labs and discussed the procedure including the risks, benefits and alternatives for the proposed anesthesia with the patient or authorized representative who has indicated his/her understanding and acceptance.   Dental Advisory Given  Plan Discussed with: Anesthesiologist, CRNA and Surgeon  Anesthesia Plan Comments:       Anesthesia Quick Evaluation

## 2016-10-21 ENCOUNTER — Ambulatory Visit (HOSPITAL_COMMUNITY): Payer: 59

## 2016-10-21 ENCOUNTER — Ambulatory Visit (HOSPITAL_COMMUNITY): Payer: 59 | Admitting: Emergency Medicine

## 2016-10-21 ENCOUNTER — Encounter (HOSPITAL_COMMUNITY)
Admission: RE | Disposition: A | Discharge status: Home / Self Care | Payer: Self-pay | Source: Ambulatory Visit | Attending: Neurosurgery

## 2016-10-21 ENCOUNTER — Ambulatory Visit (HOSPITAL_COMMUNITY)
Admission: RE | Admit: 2016-10-21 | Discharge: 2016-10-23 | Disposition: A | Discharge status: Home / Self Care | Payer: 59 | Source: Ambulatory Visit | Attending: Neurosurgery | Admitting: Neurosurgery

## 2016-10-21 ENCOUNTER — Ambulatory Visit (HOSPITAL_COMMUNITY): Payer: 59 | Admitting: Anesthesiology

## 2016-10-21 ENCOUNTER — Encounter (HOSPITAL_COMMUNITY): Payer: Self-pay

## 2016-10-21 DIAGNOSIS — M50121 Cervical disc disorder at C4-C5 level with radiculopathy: Secondary | ICD-10-CM | POA: Insufficient documentation

## 2016-10-21 DIAGNOSIS — Z7984 Long term (current) use of oral hypoglycemic drugs: Secondary | ICD-10-CM | POA: Insufficient documentation

## 2016-10-21 DIAGNOSIS — Z79899 Other long term (current) drug therapy: Secondary | ICD-10-CM | POA: Insufficient documentation

## 2016-10-21 DIAGNOSIS — M50021 Cervical disc disorder at C4-C5 level with myelopathy: Secondary | ICD-10-CM | POA: Insufficient documentation

## 2016-10-21 DIAGNOSIS — M4712 Other spondylosis with myelopathy, cervical region: Secondary | ICD-10-CM | POA: Insufficient documentation

## 2016-10-21 DIAGNOSIS — I10 Essential (primary) hypertension: Secondary | ICD-10-CM | POA: Insufficient documentation

## 2016-10-21 DIAGNOSIS — Z7982 Long term (current) use of aspirin: Secondary | ICD-10-CM | POA: Insufficient documentation

## 2016-10-21 DIAGNOSIS — Z8673 Personal history of transient ischemic attack (TIA), and cerebral infarction without residual deficits: Secondary | ICD-10-CM | POA: Diagnosis not present

## 2016-10-21 DIAGNOSIS — Z6841 Body Mass Index (BMI) 40.0 and over, adult: Secondary | ICD-10-CM | POA: Insufficient documentation

## 2016-10-21 DIAGNOSIS — Z87891 Personal history of nicotine dependence: Secondary | ICD-10-CM | POA: Diagnosis not present

## 2016-10-21 DIAGNOSIS — M4722 Other spondylosis with radiculopathy, cervical region: Secondary | ICD-10-CM | POA: Insufficient documentation

## 2016-10-21 DIAGNOSIS — G4730 Sleep apnea, unspecified: Secondary | ICD-10-CM | POA: Insufficient documentation

## 2016-10-21 DIAGNOSIS — Z419 Encounter for procedure for purposes other than remedying health state, unspecified: Secondary | ICD-10-CM

## 2016-10-21 DIAGNOSIS — M50022 Cervical disc disorder at C5-C6 level with myelopathy: Secondary | ICD-10-CM | POA: Insufficient documentation

## 2016-10-21 DIAGNOSIS — M50122 Cervical disc disorder at C5-C6 level with radiculopathy: Secondary | ICD-10-CM | POA: Insufficient documentation

## 2016-10-21 DIAGNOSIS — E785 Hyperlipidemia, unspecified: Secondary | ICD-10-CM | POA: Insufficient documentation

## 2016-10-21 DIAGNOSIS — M5020 Other cervical disc displacement, unspecified cervical region: Secondary | ICD-10-CM | POA: Diagnosis present

## 2016-10-21 HISTORY — PX: ANTERIOR CERVICAL DECOMP/DISCECTOMY FUSION: SHX1161

## 2016-10-21 LAB — GLUCOSE, CAPILLARY
Glucose-Capillary: 139 mg/dL — ABNORMAL HIGH (ref 65–99)
Glucose-Capillary: 116 mg/dL — ABNORMAL HIGH (ref 65–99)

## 2016-10-21 SURGERY — ANTERIOR CERVICAL DECOMPRESSION/DISCECTOMY FUSION 2 LEVELS
Anesthesia: General | Site: Neck

## 2016-10-21 MED ORDER — DOCUSATE SODIUM 100 MG PO CAPS
100.0000 mg | ORAL_CAPSULE | Freq: Two times a day (BID) | ORAL | Status: DC
  Administered 2016-10-21 – 2016-10-23 (×5): 100 mg via ORAL
  Filled 2016-10-21 (×5): qty 1

## 2016-10-21 MED ORDER — PHENYLEPHRINE HCL 10 MG/ML IJ SOLN
INTRAMUSCULAR | Status: DC | PRN
  Administered 2016-10-21: 80 ug via INTRAVENOUS

## 2016-10-21 MED ORDER — SUGAMMADEX SODIUM 500 MG/5ML IV SOLN
INTRAVENOUS | Status: AC
  Filled 2016-10-21: qty 5

## 2016-10-21 MED ORDER — METFORMIN HCL ER 500 MG PO TB24
1000.0000 mg | ORAL_TABLET | Freq: Every day | ORAL | Status: DC
  Administered 2016-10-22 – 2016-10-23 (×2): 1000 mg via ORAL
  Filled 2016-10-21 (×2): qty 2

## 2016-10-21 MED ORDER — DEXAMETHASONE 4 MG PO TABS
4.0000 mg | ORAL_TABLET | Freq: Four times a day (QID) | ORAL | Status: AC
  Administered 2016-10-21 – 2016-10-22 (×3): 4 mg via ORAL
  Filled 2016-10-21 (×3): qty 1

## 2016-10-21 MED ORDER — FENTANYL CITRATE (PF) 250 MCG/5ML IJ SOLN
INTRAMUSCULAR | Status: AC
  Filled 2016-10-21: qty 5

## 2016-10-21 MED ORDER — BACITRACIN ZINC 500 UNIT/GM EX OINT
TOPICAL_OINTMENT | CUTANEOUS | Status: DC | PRN
Start: 2016-10-21 — End: 2016-10-21
  Administered 2016-10-21: 1 via TOPICAL

## 2016-10-21 MED ORDER — ROCURONIUM BROMIDE 100 MG/10ML IV SOLN
INTRAVENOUS | Status: DC | PRN
  Administered 2016-10-21: 50 mg via INTRAVENOUS

## 2016-10-21 MED ORDER — ATORVASTATIN CALCIUM 20 MG PO TABS
20.0000 mg | ORAL_TABLET | Freq: Every day | ORAL | Status: DC
  Administered 2016-10-21 – 2016-10-22 (×2): 20 mg via ORAL
  Filled 2016-10-21 (×2): qty 1

## 2016-10-21 MED ORDER — LIDOCAINE 2% (20 MG/ML) 5 ML SYRINGE
INTRAMUSCULAR | Status: AC
  Filled 2016-10-21: qty 10

## 2016-10-21 MED ORDER — MIDAZOLAM HCL 5 MG/5ML IJ SOLN
INTRAMUSCULAR | Status: DC | PRN
  Administered 2016-10-21: 2 mg via INTRAVENOUS

## 2016-10-21 MED ORDER — ONDANSETRON HCL 4 MG/2ML IJ SOLN: 4.0000 mg | Freq: Four times a day (QID) | INTRAMUSCULAR | Status: DC | PRN

## 2016-10-21 MED ORDER — DEXAMETHASONE SODIUM PHOSPHATE 10 MG/ML IJ SOLN
INTRAMUSCULAR | Status: AC
  Filled 2016-10-21: qty 1

## 2016-10-21 MED ORDER — LACTATED RINGERS IV SOLN
INTRAVENOUS | Status: DC | PRN
  Administered 2016-10-21 (×2): via INTRAVENOUS

## 2016-10-21 MED ORDER — FENTANYL CITRATE (PF) 100 MCG/2ML IJ SOLN
INTRAMUSCULAR | Status: AC
  Filled 2016-10-21: qty 2

## 2016-10-21 MED ORDER — DEXMEDETOMIDINE HCL 200 MCG/2ML IV SOLN
INTRAVENOUS | Status: DC | PRN
  Administered 2016-10-21: 8 ug via INTRAVENOUS

## 2016-10-21 MED ORDER — HYDROCHLOROTHIAZIDE 12.5 MG PO CAPS
12.5000 mg | ORAL_CAPSULE | Freq: Every day | ORAL | Status: DC
  Administered 2016-10-21 – 2016-10-23 (×3): 12.5 mg via ORAL
  Filled 2016-10-21 (×3): qty 1

## 2016-10-21 MED ORDER — ARTIFICIAL TEARS OP OINT
TOPICAL_OINTMENT | OPHTHALMIC | Status: DC | PRN
  Administered 2016-10-21: 1 via OPHTHALMIC

## 2016-10-21 MED ORDER — EPHEDRINE 5 MG/ML INJ
INTRAVENOUS | Status: AC
  Filled 2016-10-21: qty 10

## 2016-10-21 MED ORDER — ACETAMINOPHEN 650 MG RE SUPP: 650.0000 mg | RECTAL | Status: DC | PRN

## 2016-10-21 MED ORDER — HYDROCODONE-ACETAMINOPHEN 10-325 MG PO TABS
1.0000 | ORAL_TABLET | ORAL | Status: DC | PRN
Start: 2016-10-21 — End: 2016-10-23
  Administered 2016-10-21 (×2): 1 via ORAL
  Filled 2016-10-21 (×2): qty 1

## 2016-10-21 MED ORDER — PHENYLEPHRINE HCL 10 MG/ML IJ SOLN
INTRAMUSCULAR | Status: DC | PRN
  Administered 2016-10-21: 50 ug/min via INTRAVENOUS

## 2016-10-21 MED ORDER — PHENYLEPHRINE 40 MCG/ML (10ML) SYRINGE FOR IV PUSH (FOR BLOOD PRESSURE SUPPORT)
PREFILLED_SYRINGE | INTRAVENOUS | Status: AC
  Filled 2016-10-21: qty 10

## 2016-10-21 MED ORDER — BISACODYL 10 MG RE SUPP: 10.0000 mg | Freq: Every day | RECTAL | Status: DC | PRN

## 2016-10-21 MED ORDER — FENTANYL CITRATE (PF) 100 MCG/2ML IJ SOLN
INTRAMUSCULAR | Status: DC | PRN
  Administered 2016-10-21 (×3): 50 ug via INTRAVENOUS
  Administered 2016-10-21 (×2): 100 ug via INTRAVENOUS
  Administered 2016-10-21: 50 ug via INTRAVENOUS

## 2016-10-21 MED ORDER — HEMOSTATIC AGENTS (NO CHARGE) OPTIME
TOPICAL | Status: DC | PRN
  Administered 2016-10-21: 1 via TOPICAL

## 2016-10-21 MED ORDER — ALUM & MAG HYDROXIDE-SIMETH 200-200-20 MG/5ML PO SUSP
30.0000 mL | Freq: Four times a day (QID) | ORAL | Status: DC | PRN
  Administered 2016-10-21: 30 mL via ORAL
  Filled 2016-10-21: qty 30

## 2016-10-21 MED ORDER — VECURONIUM BROMIDE 10 MG IV SOLR
INTRAVENOUS | Status: DC | PRN
  Administered 2016-10-21: 3 mg via INTRAVENOUS
  Administered 2016-10-21: 1 mg via INTRAVENOUS

## 2016-10-21 MED ORDER — PROPOFOL 10 MG/ML IV BOLUS
INTRAVENOUS | Status: AC
  Filled 2016-10-21: qty 20

## 2016-10-21 MED ORDER — SODIUM CHLORIDE 0.9 % IV SOLN
0.4000 ug/kg/h | INTRAVENOUS | Status: DC
  Filled 2016-10-21: qty 2

## 2016-10-21 MED ORDER — DEXAMETHASONE SODIUM PHOSPHATE 4 MG/ML IJ SOLN: 4.0000 mg | Freq: Four times a day (QID) | INTRAMUSCULAR | Status: AC

## 2016-10-21 MED ORDER — ONDANSETRON HCL 4 MG/2ML IJ SOLN
INTRAMUSCULAR | Status: AC
  Filled 2016-10-21: qty 4

## 2016-10-21 MED ORDER — LABETALOL HCL 5 MG/ML IV SOLN
10.0000 mg | INTRAVENOUS | Status: DC | PRN
  Administered 2016-10-21: 10 mg via INTRAVENOUS

## 2016-10-21 MED ORDER — LACTATED RINGERS IV SOLN: INTRAVENOUS | Status: DC

## 2016-10-21 MED ORDER — THROMBIN 5000 UNITS EX SOLR
CUTANEOUS | Status: DC | PRN
  Administered 2016-10-21 (×2): 5000 [IU] via TOPICAL

## 2016-10-21 MED ORDER — ACETAMINOPHEN 325 MG PO TABS
650.0000 mg | ORAL_TABLET | ORAL | Status: DC | PRN
  Administered 2016-10-21: 650 mg via ORAL
  Filled 2016-10-21: qty 2

## 2016-10-21 MED ORDER — GABAPENTIN 300 MG PO CAPS
600.0000 mg | ORAL_CAPSULE | Freq: Four times a day (QID) | ORAL | Status: DC
  Administered 2016-10-21 – 2016-10-23 (×7): 600 mg via ORAL
  Filled 2016-10-21: qty 6
  Filled 2016-10-21 (×6): qty 2

## 2016-10-21 MED ORDER — BACITRACIN ZINC 500 UNIT/GM EX OINT
TOPICAL_OINTMENT | CUTANEOUS | Status: AC
  Filled 2016-10-21: qty 28.35

## 2016-10-21 MED ORDER — MENTHOL 3 MG MT LOZG: 1.0000 | LOZENGE | OROMUCOSAL | Status: DC | PRN

## 2016-10-21 MED ORDER — VANCOMYCIN HCL 10 G IV SOLR
1500.0000 mg | Freq: Once | INTRAVENOUS | Status: AC
  Administered 2016-10-21: 1500 mg via INTRAVENOUS
  Filled 2016-10-21: qty 1500

## 2016-10-21 MED ORDER — LISINOPRIL-HYDROCHLOROTHIAZIDE 10-12.5 MG PO TABS: 1.0000 | ORAL_TABLET | Freq: Every day | ORAL | Status: DC

## 2016-10-21 MED ORDER — PROMETHAZINE HCL 25 MG/ML IJ SOLN
6.2500 mg | INTRAMUSCULAR | Status: DC | PRN
Start: 2016-10-21 — End: 2016-10-21

## 2016-10-21 MED ORDER — CHLORHEXIDINE GLUCONATE CLOTH 2 % EX PADS: 6.0000 | MEDICATED_PAD | Freq: Once | CUTANEOUS | Status: DC

## 2016-10-21 MED ORDER — SUGAMMADEX SODIUM 500 MG/5ML IV SOLN
INTRAVENOUS | Status: DC | PRN
  Administered 2016-10-21: 350 mg via INTRAVENOUS

## 2016-10-21 MED ORDER — LISINOPRIL 20 MG PO TABS
10.0000 mg | ORAL_TABLET | Freq: Every day | ORAL | Status: DC
  Administered 2016-10-21 – 2016-10-23 (×3): 10 mg via ORAL
  Filled 2016-10-21 (×3): qty 1

## 2016-10-21 MED ORDER — THROMBIN 5000 UNITS EX SOLR
CUTANEOUS | Status: AC
  Filled 2016-10-21: qty 15000

## 2016-10-21 MED ORDER — PHENOL 1.4 % MT LIQD
1.0000 | OROMUCOSAL | Status: DC | PRN
  Filled 2016-10-21: qty 177

## 2016-10-21 MED ORDER — FENTANYL CITRATE (PF) 100 MCG/2ML IJ SOLN
25.0000 ug | INTRAMUSCULAR | Status: DC | PRN
  Administered 2016-10-21 (×2): 50 ug via INTRAVENOUS

## 2016-10-21 MED ORDER — SUCCINYLCHOLINE CHLORIDE 20 MG/ML IJ SOLN
INTRAMUSCULAR | Status: DC | PRN
  Administered 2016-10-21: 140 mg via INTRAVENOUS

## 2016-10-21 MED ORDER — CLONIDINE HCL 0.1 MG PO TABS
0.1000 mg | ORAL_TABLET | ORAL | Status: DC
  Administered 2016-10-21 – 2016-10-23 (×5): 0.1 mg via ORAL
  Filled 2016-10-21 (×5): qty 1

## 2016-10-21 MED ORDER — 0.9 % SODIUM CHLORIDE (POUR BTL) OPTIME
TOPICAL | Status: DC | PRN
  Administered 2016-10-21: 1000 mL

## 2016-10-21 MED ORDER — ONDANSETRON HCL 4 MG/2ML IJ SOLN
INTRAMUSCULAR | Status: DC | PRN
  Administered 2016-10-21: 4 mg via INTRAVENOUS

## 2016-10-21 MED ORDER — LIDOCAINE-EPINEPHRINE 1 %-1:100000 IJ SOLN
INTRAMUSCULAR | Status: AC
  Filled 2016-10-21: qty 1

## 2016-10-21 MED ORDER — PROPOFOL 10 MG/ML IV BOLUS
INTRAVENOUS | Status: DC | PRN
  Administered 2016-10-21: 300 mg via INTRAVENOUS

## 2016-10-21 MED ORDER — THROMBIN 5000 UNITS EX SOLR
CUTANEOUS | Status: DC | PRN
  Administered 2016-10-21: 11:00:00 via TOPICAL

## 2016-10-21 MED ORDER — DEXAMETHASONE SODIUM PHOSPHATE 10 MG/ML IJ SOLN
INTRAMUSCULAR | Status: DC | PRN
  Administered 2016-10-21: 10 mg via INTRAVENOUS

## 2016-10-21 MED ORDER — SUCCINYLCHOLINE CHLORIDE 200 MG/10ML IV SOSY
PREFILLED_SYRINGE | INTRAVENOUS | Status: AC
  Filled 2016-10-21: qty 10

## 2016-10-21 MED ORDER — PHENYLEPHRINE HCL 10 MG/ML IJ SOLN
INTRAMUSCULAR | Status: AC
  Filled 2016-10-21: qty 1

## 2016-10-21 MED ORDER — LIDOCAINE HCL (CARDIAC) 20 MG/ML IV SOLN
INTRAVENOUS | Status: DC | PRN
  Administered 2016-10-21: 60 mg via INTRAVENOUS

## 2016-10-21 MED ORDER — BUPIVACAINE HCL (PF) 0.5 % IJ SOLN
INTRAMUSCULAR | Status: AC
  Filled 2016-10-21: qty 30

## 2016-10-21 MED ORDER — ONDANSETRON HCL 4 MG PO TABS
4.0000 mg | ORAL_TABLET | Freq: Four times a day (QID) | ORAL | Status: DC | PRN
  Administered 2016-10-21: 4 mg via ORAL
  Filled 2016-10-21: qty 1

## 2016-10-21 MED ORDER — MIDAZOLAM HCL 2 MG/2ML IJ SOLN
INTRAMUSCULAR | Status: AC
  Filled 2016-10-21: qty 2

## 2016-10-21 MED ORDER — BACITRACIN 50000 UNITS IM SOLR
INTRAMUSCULAR | Status: DC | PRN
  Administered 2016-10-21: 11:00:00

## 2016-10-21 MED ORDER — LIDOCAINE-EPINEPHRINE 1 %-1:100000 IJ SOLN
INTRAMUSCULAR | Status: DC | PRN
  Administered 2016-10-21: 10 mL

## 2016-10-21 MED ORDER — OXYCODONE HCL 5 MG PO TABS
10.0000 mg | ORAL_TABLET | ORAL | Status: DC | PRN
  Administered 2016-10-21 – 2016-10-23 (×9): 10 mg via ORAL
  Filled 2016-10-21 (×9): qty 2

## 2016-10-21 MED ORDER — CYCLOBENZAPRINE HCL 10 MG PO TABS
10.0000 mg | ORAL_TABLET | Freq: Three times a day (TID) | ORAL | Status: DC | PRN
Start: 2016-10-21 — End: 2016-10-23
  Administered 2016-10-21 – 2016-10-22 (×2): 10 mg via ORAL
  Filled 2016-10-21 (×2): qty 1

## 2016-10-21 MED ORDER — ROCURONIUM BROMIDE 50 MG/5ML IV SOSY
PREFILLED_SYRINGE | INTRAVENOUS | Status: AC
  Filled 2016-10-21: qty 10

## 2016-10-21 MED ORDER — LABETALOL HCL 5 MG/ML IV SOLN
INTRAVENOUS | Status: AC
  Filled 2016-10-21: qty 4

## 2016-10-21 MED ORDER — LABETALOL HCL 5 MG/ML IV SOLN
5.0000 mg | INTRAVENOUS | Status: DC | PRN
  Administered 2016-10-21: 10 mg via INTRAVENOUS

## 2016-10-21 MED ORDER — EPHEDRINE SULFATE 50 MG/ML IJ SOLN
INTRAMUSCULAR | Status: DC | PRN
  Administered 2016-10-21 (×3): 10 mg via INTRAVENOUS

## 2016-10-21 MED ORDER — COLESEVELAM HCL 625 MG PO TABS
1875.0000 mg | ORAL_TABLET | Freq: Every day | ORAL | Status: DC
  Administered 2016-10-22 – 2016-10-23 (×2): 1875 mg via ORAL
  Filled 2016-10-21 (×3): qty 3

## 2016-10-21 SURGICAL SUPPLY — 71 items
APL SKNCLS STERI-STRIP NONHPOA (GAUZE/BANDAGES/DRESSINGS) ×1
BAG DECANTER FOR FLEXI CONT (MISCELLANEOUS) ×2 IMPLANT
BENZOIN TINCTURE PRP APPL 2/3 (GAUZE/BANDAGES/DRESSINGS) ×3 IMPLANT
BIT DRILL NEURO 2X3.1 SFT TUCH (MISCELLANEOUS) ×1 IMPLANT
BLADE SURG 15 STRL LF DISP TIS (BLADE) ×1 IMPLANT
BLADE SURG 15 STRL SS (BLADE) ×2
BLADE ULTRA TIP 2M (BLADE) ×2 IMPLANT
BUR BARREL STRAIGHT FLUTE 4.0 (BURR) ×2 IMPLANT
BUR MATCHSTICK NEURO 3.0 LAGG (BURR) ×2 IMPLANT
CANISTER SUCT 3000ML PPV (MISCELLANEOUS) ×2 IMPLANT
CARTRIDGE OIL MAESTRO DRILL (MISCELLANEOUS) ×1 IMPLANT
COVER MAYO STAND STRL (DRAPES) ×2 IMPLANT
DECANTER SPIKE VIAL GLASS SM (MISCELLANEOUS) ×1 IMPLANT
DEVICE FUSION VISTA 14X14X9MM (Spacer) IMPLANT
DIFFUSER DRILL AIR PNEUMATIC (MISCELLANEOUS) ×2 IMPLANT
DRAPE LAPAROTOMY 100X72 PEDS (DRAPES) ×2 IMPLANT
DRAPE MICROSCOPE LEICA (MISCELLANEOUS) IMPLANT
DRAPE POUCH INSTRU U-SHP 10X18 (DRAPES) ×2 IMPLANT
DRAPE SURG 17X23 STRL (DRAPES) ×4 IMPLANT
DRILL NEURO 2X3.1 SOFT TOUCH (MISCELLANEOUS) ×2
ELECT BLADE 4.0 EZ CLEAN MEGAD (MISCELLANEOUS) ×2
ELECT REM PT RETURN 9FT ADLT (ELECTROSURGICAL) ×2
ELECTRODE BLDE 4.0 EZ CLN MEGD (MISCELLANEOUS) IMPLANT
ELECTRODE REM PT RTRN 9FT ADLT (ELECTROSURGICAL) ×1 IMPLANT
GAUZE SPONGE 4X4 12PLY STRL (GAUZE/BANDAGES/DRESSINGS) ×2 IMPLANT
GAUZE SPONGE 4X4 16PLY XRAY LF (GAUZE/BANDAGES/DRESSINGS) ×1 IMPLANT
GLOVE BIO SURGEON STRL SZ8 (GLOVE) ×3 IMPLANT
GLOVE BIO SURGEON STRL SZ8.5 (GLOVE) ×2 IMPLANT
GLOVE BIOGEL PI IND STRL 7.5 (GLOVE) IMPLANT
GLOVE BIOGEL PI IND STRL 8 (GLOVE) IMPLANT
GLOVE BIOGEL PI INDICATOR 7.5 (GLOVE) ×1
GLOVE BIOGEL PI INDICATOR 8 (GLOVE) ×6
GLOVE ECLIPSE 7.5 STRL STRAW (GLOVE) ×3 IMPLANT
GLOVE EXAM NITRILE LRG STRL (GLOVE) IMPLANT
GLOVE EXAM NITRILE XL STR (GLOVE) IMPLANT
GLOVE EXAM NITRILE XS STR PU (GLOVE) IMPLANT
GLOVE INDICATOR 8.5 STRL (GLOVE) ×1 IMPLANT
GLOVE SURG SS PI 7.0 STRL IVOR (GLOVE) ×2 IMPLANT
GOWN STRL REUS W/ TWL LRG LVL3 (GOWN DISPOSABLE) IMPLANT
GOWN STRL REUS W/ TWL XL LVL3 (GOWN DISPOSABLE) IMPLANT
GOWN STRL REUS W/TWL 2XL LVL3 (GOWN DISPOSABLE) ×3 IMPLANT
GOWN STRL REUS W/TWL LRG LVL3 (GOWN DISPOSABLE) ×2
GOWN STRL REUS W/TWL XL LVL3 (GOWN DISPOSABLE) ×4
HEMOSTAT POWDER KIT SURGIFOAM (HEMOSTASIS) ×2 IMPLANT
KIT BASIN OR (CUSTOM PROCEDURE TRAY) ×2 IMPLANT
KIT ROOM TURNOVER OR (KITS) ×2 IMPLANT
MARKER SKIN DUAL TIP RULER LAB (MISCELLANEOUS) ×2 IMPLANT
NDL SPNL 18GX3.5 QUINCKE PK (NEEDLE) ×1 IMPLANT
NEEDLE HYPO 22GX1.5 SAFETY (NEEDLE) ×2 IMPLANT
NEEDLE SPNL 18GX3.5 QUINCKE PK (NEEDLE) ×2 IMPLANT
NS IRRIG 1000ML POUR BTL (IV SOLUTION) ×2 IMPLANT
OIL CARTRIDGE MAESTRO DRILL (MISCELLANEOUS) ×2
PACK LAMINECTOMY NEURO (CUSTOM PROCEDURE TRAY) ×2 IMPLANT
PATTIES SURGICAL 1X1 (DISPOSABLE) ×1 IMPLANT
PEEK VISTA 14X14X7MM (Peek) ×1 IMPLANT
PIN DISTRACTION 14MM (PIN) ×4 IMPLANT
PLATE ANT CERV XTEND 2 LV 32 (Plate) ×1 IMPLANT
PUTTY KINEX BIOACTIVE 5CC (Bone Implant) ×1 IMPLANT
RUBBERBAND STERILE (MISCELLANEOUS) IMPLANT
SCREW XTD VAR 4.2 SELF TAP (Screw) ×6 IMPLANT
SPONGE INTESTINAL PEANUT (DISPOSABLE) ×4 IMPLANT
SPONGE SURGIFOAM ABS GEL SZ50 (HEMOSTASIS) ×2 IMPLANT
STRIP CLOSURE SKIN 1/2X4 (GAUZE/BANDAGES/DRESSINGS) ×2 IMPLANT
SUT VIC AB 0 CT1 27 (SUTURE) ×2
SUT VIC AB 0 CT1 27XBRD ANTBC (SUTURE) ×1 IMPLANT
SUT VIC AB 3-0 SH 8-18 (SUTURE) ×2 IMPLANT
TAPE CLOTH SURG 4X10 WHT LF (GAUZE/BANDAGES/DRESSINGS) ×1 IMPLANT
TOWEL GREEN STERILE (TOWEL DISPOSABLE) ×2 IMPLANT
TOWEL GREEN STERILE FF (TOWEL DISPOSABLE) ×1 IMPLANT
VISTA 14X14X9MM (Spacer) ×2 IMPLANT
WATER STERILE IRR 1000ML POUR (IV SOLUTION) ×2 IMPLANT

## 2016-10-21 NOTE — Progress Notes (Signed)
Orthopedic Tech Progress Note Patient Details:  Brandon [name] 12/04/1970 [id number]  Ortho Devices Type of Ortho Device: Soft collar Ortho Device/Splint Location: neck Ortho Device/Splint Interventions: Ordered, Application   Jennye Moccasin 10/21/2016, 5:54 PM

## 2016-10-21 NOTE — Progress Notes (Signed)
Subjective:  The patient is still quite somnolent. He is arousable.  Objective: Vital signs in last 24 hours: Temp:  [97.9 F (36.6 C)-98.2 F (36.8 C)] 97.9 F (36.6 C) (04/18 1312) Pulse Rate:  [83-113] 95 (04/18 1355) Resp:  [7-22] 8 (04/18 1355) BP: (183-213)/(85-109) 213/108 (04/18 1355) SpO2:  [95 %-99 %] 95 % (04/18 1355) Weight:  [136.9 kg (301 lb 14.4 oz)] 136.9 kg (301 lb 14.4 oz) (04/18 0824)  Intake/Output from previous day: No intake/output data recorded. Intake/Output this shift: Total I/O In: 2300 [I.V.:1800; IV Piggyback:500] Out: 300 [Blood:300]  Physical exam the patient is somewhat arousable. He is moving all 4 extremities.  Lab Results: No results for input(s): WBC, HGB, HCT, PLT in the last 72 hours. BMET No results for input(s): NA, K, CL, CO2, GLUCOSE, BUN, CREATININE, CALCIUM in the last 72 hours.  Studies/Results: Dg Cervical Spine 2-3 Views  Result Date: 10/21/2016 CLINICAL DATA:  Cervical spine surgery. EXAM: CERVICAL SPINE - 2-3 VIEW COMPARISON:  Cervical spine MRI 08/04/2016 and radiographs 07/03/2016 FINDINGS: Three intraoperative cross table lateral radiographs of the cervical spine are provided. The mid and lower cervical spine is poorly visualized due to superimposed shoulder tissues. The first 2 radiographs demonstrate advancement of a needle to the C4-5 disc space level. The final image demonstrates placement of an anterior fusion plate from M0-E0 with screws at C4 and C5. There may be screws at C6 as well, however these are not well seen due to underpenetration. An endotracheal tube is noted. IMPRESSION: Intraoperative images during anterior cervical fusion. Electronically Signed   By: Sebastian Ache M.D.   On: 10/21/2016 13:42    Assessment/Plan: The patient is doing well.  LOS: 0 days     Brenlynn Fake D 10/21/2016, 2:05 PM

## 2016-10-21 NOTE — Progress Notes (Signed)
Vancomycin per pharmacy for surgical prophylaxis  46 YO M s/p spinal surgery, on vancomycin for surgical prophylaxis d/t penicillin allergy (anaphylaxis), pharmacy to adjust dosage base one renal function. Scr 0.78 on 4/9, est. crcl ~ > 100 ml/min. Received pre-op dose 1500mg  vancomycin at ~ 0915. No drain  Plan: Vancomycin 1500 mg x 1 at 2130. Pharmacy sign off.  Thanks.   Bayard Hugger, PharmD, BCPS  Clinical Pharmacist  Pager: [phone]

## 2016-10-21 NOTE — Transfer of Care (Signed)
Immediate Anesthesia Transfer of Care Note  Patient: Brandon [name]  Procedure(s) Performed: Procedure(s): ANTERIOR CERVICAL DECOMPRESSION/DISCECTOMY FUSION, INTERBODY PROSTHESIS,PLATE CERVICAL FOUR- CERVICAL FIVE, CERVICAL FIVE- CERVICAL SIX (N/A)  Patient Location: PACU  Anesthesia Type:General  Level of Consciousness: sedated  Airway & Oxygen Therapy: Patient Spontanous Breathing and Patient connected to face mask oxygen  Post-op Assessment: Report given to RN, Post -op Vital signs reviewed and stable and Patient moving all extremities X 4  Post vital signs: Reviewed and stable  Last Vitals:  Vitals:   10/21/16 0839 10/21/16 1312  BP: (!) 199/90   Pulse:    Resp:    Temp:  (P) 36.6 C    Last Pain:  Vitals:   10/21/16 0824  TempSrc: Oral      Patients Stated Pain Goal: 3 (10/21/16 0831)  Complications: No apparent anesthesia complications

## 2016-10-21 NOTE — Anesthesia Procedure Notes (Signed)
Procedure Name: Intubation Date/Time: 10/21/2016 9:52 AM Performed by: Carmela Rima Pre-anesthesia Checklist: Timeout performed, Suction available, Patient being monitored, Emergency Drugs available and Patient identified Patient Re-evaluated:Patient Re-evaluated prior to inductionOxygen Delivery Method: Circle system utilized Preoxygenation: Pre-oxygenation with 100% oxygen Intubation Type: IV induction and Rapid sequence Laryngoscope Size: Glidescope and 3 Grade View: Grade I Tube type: Oral Tube size: 7.5 mm Number of attempts: 1 Airway Equipment and Method: Video-laryngoscopy Placement Confirmation: breath sounds checked- equal and bilateral,  positive ETCO2 and ETT inserted through vocal cords under direct vision Secured at: 24 cm Tube secured with: Tape Dental Injury: Teeth and Oropharynx as per pre-operative assessment

## 2016-10-21 NOTE — Anesthesia Postprocedure Evaluation (Signed)
Anesthesia Post Note  Patient: Brandon [name]  Procedure(s) Performed: Procedure(s) (LRB): ANTERIOR CERVICAL DECOMPRESSION/DISCECTOMY FUSION, INTERBODY PROSTHESIS,PLATE CERVICAL FOUR- CERVICAL FIVE, CERVICAL FIVE- CERVICAL SIX (N/A)  Patient location during evaluation: PACU Anesthesia Type: General Level of consciousness: awake and alert Pain management: pain level controlled Vital Signs Assessment: post-procedure vital signs reviewed and stable Respiratory status: spontaneous breathing, nonlabored ventilation, respiratory function stable and patient connected to nasal cannula oxygen Cardiovascular status: blood pressure returned to baseline and stable Postop Assessment: no signs of nausea or vomiting Anesthetic complications: no       Last Vitals:  Vitals:   10/21/16 1500 10/21/16 1536  BP: (!) 161/96 (!) 155/101  Pulse: 85 78  Resp: (!) 23 20  Temp: 36.8 C 37.1 C    Last Pain:  Vitals:   10/21/16 1800  TempSrc:   PainSc: 7                  Kennieth Rad

## 2016-10-21 NOTE — H&P (Signed)
Subjective: The patient is a 46 year old male who has complained of neck pain with pain numbness and tingling into his left shoulder and left arm. He has failed medical management and was worked up with a cervical MRI. This demonstrated spondylosis and stenosis at C4-5 and C5-6. I discussed the various treatment options with the patient. He has decided proceed with a C4-5 and C5-6 anterior cervical discectomy, fusion, and plating.   Past Medical History:  Diagnosis Date  . ADEM (acute disseminated encephalomyelitis)   . Allergy   . Anxiety 2005  . Complication of anesthesia   . Depression 2005  . Difficult intubation    difficult intubating and exbutating "throat Clinched around tube"  . Headache(784.0)   . Hearing loss   . Hyperinsulinemia 07/04/11   "I'm on metformin cause I produce too much insulin"  . Hyperlipidemia   . Hypertension   . Mononucleosis, infectious, with hepatitis 06/2011  . MS (multiple sclerosis) (HCC)    patinet has ADEM not MS per Patient  . Nausea    occasional per history form 08/18/11  . Neuromuscular disorder (HCC)   . Pneumonia 2010  . Sleep apnea    uses CPAP at home  . Stroke (HCC) 2000   TIA  . TIA (transient ischemic attack) 2000   denies residual  . TIA (transient ischemic attack)     Past Surgical History:  Procedure Laterality Date  . CHOLECYSTECTOMY  08/02/2011   Procedure: LAPAROSCOPIC CHOLECYSTECTOMY;  Surgeon: Shelly Rubenstein, MD;  Location: [facility] OR;  Service: General;;  . COLONOSCOPY  2003  . ESOPHAGOGASTRODUODENOSCOPY  2003  . ESOPHAGOGASTRODUODENOSCOPY  07/28/2011   Procedure: ESOPHAGOGASTRODUODENOSCOPY (EGD);  Surgeon: [name], MD;  Location: [facility] ENDOSCOPY;  Service: Endoscopy;  Laterality: N/A;  . MYRINGOTOMY  1979; 1980   bilaterally  . TONSILLECTOMY AND ADENOIDECTOMY  ~ 1980    Allergies  Allergen Reactions  . Penicillin G Anaphylaxis    Gets extremely high fevers.  . Sulfa Antibiotics Other (See Comments)    It will kill  him  . Amoxicillin   . Cephalosporins Other (See Comments)    Reaction unspecified.  [name] Penicillins Other (See Comments)    fever  . Quinolones Other (See Comments)    Reaction unspecified  . Sulfamethoxazole Swelling    Social History  Substance Use Topics  . Smoking status: Former Smoker    Packs/day: 3.00    Years: 3.00    Types: Cigarettes    Quit date: 08/18/1987  . Smokeless tobacco: Never Used  . Alcohol use 0.0 oz/week     Comment: 07/04/11 "glass of wine or spirits once a month"    Family History  Problem Relation Age of Onset  . Coronary artery disease Father   . Stroke Father   . Diabetes Mother   . Cancer Maternal Grandmother     bone  . Cancer Other     Stomach -Great-great grandmother   Prior to Admission medications   Medication Sig Start Date End Date Taking? Authorizing Provider  aspirin 81 MG tablet Take 81 mg by mouth daily. Reported on 10/15/2015   Yes Historical Provider, MD  atorvastatin (LIPITOR) 20 MG tablet Take 1 tablet (20 mg total) by mouth at bedtime. Patient taking differently: Take 20 mg by mouth at bedtime. (0000) 09/26/16  Yes Morrell Riddle, PA-C  cloNIDine (CATAPRES) 0.1 MG tablet Take 0.1 mg by mouth 3 (three) times daily. (0600, 1200, 1800)   Yes Historical Provider, MD  colesevelam [facility])  625 MG tablet Take 3 tablets (1,875 mg total) by mouth daily with breakfast. Patient taking differently: Take 1,250 mg by mouth 3 (three) times daily. (0600, 1200, 1800) 09/17/16  Yes Morrell Riddle, PA-C  cyclobenzaprine (AMRIX) 30 MG 24 hr capsule Take 1 capsule (30 mg total) by mouth daily as needed. Patient taking differently: Take 30 mg by mouth at bedtime.  09/17/16  Yes Morrell Riddle, PA-C  diclofenac (VOLTAREN) 75 MG EC tablet Take 1 tablet (75 mg total) by mouth 2 (two) times daily. Patient taking differently: Take 75 mg by mouth 2 (two) times daily. (1200 & 0000) 09/17/16  Yes Morrell Riddle, PA-C  docusate sodium (COLACE) 100 MG capsule Take 2  capsules (200 mg total) by mouth 2 (two) times daily. Patient taking differently: Take 200 mg by mouth 2 (two) times daily. (1200 & 0000) 09/17/16  Yes Morrell Riddle, PA-C  gabapentin (NEURONTIN) 300 MG capsule Take 2 capsules (600 mg total) by mouth 3 (three) times daily. Patient taking differently: Take 600 mg by mouth 4 (four) times daily. (0600, 1200, 1800, 0000) 09/17/16  Yes Morrell Riddle, PA-C  HYDROcodone-acetaminophen (NORCO) 10-325 MG tablet Take 1 tablet by mouth every 6 (six) hours as needed. Patient taking differently: Take 1 tablet by mouth every 6 (six) hours as needed. (0600, 1200, 1800, 0000) 09/17/16  Yes Morrell Riddle, PA-C  lisinopril-hydrochlorothiazide (PRINZIDE,ZESTORETIC) 10-12.5 MG tablet Take 1 tablet by mouth daily. Patient taking differently: Take 1 tablet by mouth at bedtime. 0000 09/17/16  Yes Morrell Riddle, PA-C  Melatonin 10 MG TABS Take 10 mg by mouth at bedtime as needed (for sleep.).   Yes Historical Provider, MD  metFORMIN (GLUCOPHAGE-XR) 500 MG 24 hr tablet Take 2 tablets (1,000 mg total) by mouth daily with breakfast. Patient taking differently: Take 1,000 mg by mouth at bedtime. (0000) 09/17/16  Yes Morrell Riddle, PA-C  methocarbamol (ROBAXIN) 500 MG tablet Take 1 tablet (500 mg total) by mouth at bedtime. Patient taking differently: Take 500 mg by mouth daily at 6 PM.  09/26/16  Yes Morrell Riddle, PA-C  naproxen sodium (ANAPROX) 220 MG tablet Take 440 mg by mouth daily as needed (for breakthrough pain.).   Yes Historical Provider, MD  NON FORMULARY CPAP machine at bedtime & sleep   Yes Historical Provider, MD  metaxalone (SKELAXIN) 800 MG tablet Take 1 tablet (800 mg total) by mouth daily. Patient not taking: Reported on 10/07/2016 09/17/16   Morrell Riddle, PA-C     Review of Systems  Positive ROS: As above  All other systems have been reviewed and were otherwise negative with the exception of those mentioned in the HPI and as above.  Objective: Vital signs in  last 24 hours: Temp:  [98.2 F (36.8 C)] 98.2 F (36.8 C) (04/18 0824) Pulse Rate:  [83] 83 (04/18 0824) Resp:  [20] 20 (04/18 0824) BP: (199)/(90) 199/90 (04/18 0839) SpO2:  [99 %] 99 % (04/18 0824) Weight:  [136.9 kg (301 lb 14.4 oz)] 136.9 kg (301 lb 14.4 oz) (04/18 0824)  General Appearance: Alert Head: Normocephalic, without obvious abnormality, atraumatic Eyes: PERRL, conjunctiva/corneas clear, EOM's intact,    Ears: Normal  Throat: Normal  Neck: Supple, Back: unremarkable Lungs: Clear to auscultation bilaterally, respirations unlabored Heart: Regular rate and rhythm, no murmur, rub or gallop Abdomen: Soft, non-tender Extremities: Extremities normal, atraumatic, no cyanosis or edema Skin: unremarkable  NEUROLOGIC: The patient is moving all 4 extremities well.  Mental  status: alert and oriented,Motor Exam - grossly normal Sensory Exam - grossly normal Reflexes:  Coordination - grossly normal Gait - grossly normal Balance - grossly normal Cranial Nerves: I: smell Not tested  II: visual acuity  OS: Normal  OD: Normal   II: visual fields Full to confrontation  II: pupils Equal, round, reactive to light  III,VII: ptosis None  III,IV,VI: extraocular muscles  Full ROM  V: mastication Normal  V: facial light touch sensation  Normal  V,VII: corneal reflex  Present  VII: facial muscle function - upper  Normal  VII: facial muscle function - lower Normal  VIII: hearing Not tested  IX: soft palate elevation  Normal  IX,X: gag reflex Present  XI: trapezius strength  5/5  XI: sternocleidomastoid strength 5/5  XI: neck flexion strength  5/5  XII: tongue strength  Normal    Data Review Lab Results  Component Value Date   WBC 6.2 10/12/2016   HGB 14.8 10/12/2016   HCT 43.5 10/12/2016   MCV 82.5 10/12/2016   PLT 151 10/12/2016   Lab Results  Component Value Date   NA 143 10/12/2016   K 3.5 10/12/2016   CL 101 10/12/2016   CO2 29 10/12/2016   BUN 12 10/12/2016    CREATININE 0.78 10/12/2016   GLUCOSE 90 10/12/2016   Lab Results  Component Value Date   INR 1.06 06/11/2009    Assessment/Plan: C4-5 and C5-6 spondylosis, stenosis, cervicalgia, cervical radiculopathy: I have discussed situation with the patient and reviewed his imaging studies with him. We have discussed the various treatment options including surgery. I have described the surgical treatment of a C4-5 and C5-6 anterior cervical discectomy, fusion, and plating. I have shown him surgical models. We have discussed the risks, benefits, alternatives, expected postoperative course, and likelihood of achieving our goals with surgery. I have answered all patient's questions. He has decided to proceed with surgery.   Zuri Lascala D 10/21/2016 9:33 AM

## 2016-10-21 NOTE — Op Note (Signed)
Brief history: The patient is a 46 year old male who has complained of neck shoulder and arm pain consistent with a cervical radiculopathy. He has failed medical management and was worked up with a cervical MRI. This demonstrated herniated disc and spondylosis at C4-5 and C5-6. I discussed the situation with the patient and reviewed his imaging studies with him. We discussed the various treatment options including surgery. He has weighed the risks, benefits, and alternatives to surgery and decided to proceed with the C4-5 and C5-6 anterior cervical discectomy, fusion, and plating.  Preoperative diagnosis: C4-5 herniated disc, C5-6 spondylosis, cervicalgia, cervical radiculopathy, cervical myelopathy  Postoperative diagnosis: The same  Procedure: C4-5 and C5-6 Anterior cervical discectomy/decompression; C4-5 and C5-6 interbody arthrodesis with local morcellized autograft bone and Kinnex bone graft extender; insertion of interbody prosthesis at C4-5 and C5-6 (Zimmer peek interbody prosthesis); anterior cervical plating from C4 to C6 with globus titanium plate  Surgeon: Dr. Delma Officer  Asst.: Dr. Wynetta Emery  Anesthesia: Gen. endotracheal  Estimated blood loss: 150 mL  Drains: None  Complications: None  Description of procedure: The patient was brought to the operating room by the anesthesia team. General endotracheal anesthesia was induced. A roll was placed under the patient's shoulders to keep the neck in the neutral position. The patient's anterior cervical region was then prepared with Betadine scrub and Betadine solution. Sterile drapes were applied.  The area to be incised was then injected with Marcaine with epinephrine solution. I then used a scalpel to make a transverse incision in the patient's left anterior neck. I used the Metzenbaum scissors to divide the platysmal muscle and then to dissect medial to the sternocleidomastoid muscle, jugular vein, and carotid artery. I carefully dissected  down towards the anterior cervical spine identifying the esophagus and retracting it medially. Then using Kitner swabs to clear soft tissue from the anterior cervical spine. We then inserted a bent spinal needle into the upper exposed intervertebral disc space. We then obtained intraoperative radiographs confirm our location.  I then used electrocautery to detach the medial border of the longus colli muscle bilaterally from the C4-5 and C5-6 intervertebral disc spaces. I then inserted the Caspar self-retaining retractor underneath the longus colli muscle bilaterally to provide exposure.  We then incised the intervertebral disc at C4-5. We then performed a partial intervertebral discectomy with a pituitary forceps and the Karlin curettes. I then inserted distraction screws into the vertebral bodies at C4-5. We then distracted the interspace. We then used the high-speed drill to decorticate the vertebral endplates at C4-5, to drill away the remainder of the intervertebral disc, to drill away some posterior spondylosis, and to thin out the posterior longitudinal ligament. I then incised ligament with the arachnoid knife. We then removed the ligament with a Kerrison punches undercutting the vertebral endplates and decompressing the thecal sac. We then performed foraminotomies about the bilateral C5 nerve roots. This completed the decompression at this level.  We then repeated this procedure in analogous fashion at C5-6 decompressing the thecal sac and the bilateral C6 nerve roots.  We now turned our to attention to the interbody fusion. We used the trial spacers to determine the appropriate size for the interbody prosthesis. We then pre-filled prosthesis with a combination of local morcellized autograft bone that we obtained during decompression as well as Kinnex bone graft extender. We then inserted the prosthesis into the distracted interspace at C4-5 and C5-6. We then removed the distraction screws. There was  a good snug fit of the prosthesis  in the interspace.  Having completed the fusion we now turned attention to the anterior spinal instrumentation. We used the high-speed drill to drill away some anterior spondylosis at the disc spaces so that the plate lay down flat. We selected the appropriate length titanium anterior cervical plate. We laid it along the anterior aspect of the vertebral bodies from C4-C6. We then drilled 14 mm holes at C4, C5 and C6. We then secured the plate to the vertebral bodies by placing two 14 mm self-tapping screws at C4, C5 and C6. We then obtained intraoperative radiograph. The demonstrating good position of the instrumentation, with limited visualization because of the patient's body habitus. We therefore secured the screws the plate the locking each cam. This completed the instrumentation.  We then obtained hemostasis using bipolar electrocautery. We irrigated the wound out with bacitracin solution. We then removed the retractor. We inspected the esophagus for any damage. There was none apparent. We then reapproximated patient's platysmal muscle with interrupted 3-0 Vicryl suture. We then reapproximated the subcutaneous tissue with interrupted 3-0 Vicryl suture. The skin was reapproximated with Steri-Strips and benzoin. The wound was then covered with bacitracin ointment. A sterile dressing was applied. The drapes were removed. Patient was subsequently extubated by the anesthesia team and transported to the post anesthesia care unit in stable condition. All sponge instrument and needle counts were reportedly correct at the end of this case.

## 2016-10-21 NOTE — Progress Notes (Signed)
Patient ID: Brandon [name], male   DOB: [birth year], 46 y.o.   MRN: [id number] Subjective:  The patient is alert and pleasant. He is not getting adequate pain relief with hydrocodone. He looks well.  Objective: Vital signs in last 24 hours: Temp:  [97.9 F (36.6 C)-98.8 F (37.1 C)] 98.8 F (37.1 C) (04/18 1536) Pulse Rate:  [76-113] 78 (04/18 1536) Resp:  [4-23] 20 (04/18 1536) BP: (155-213)/(85-112) 155/101 (04/18 1536) SpO2:  [95 %-99 %] 98 % (04/18 1536) Weight:  [136.9 kg (301 lb 14.4 oz)] 136.9 kg (301 lb 14.4 oz) (04/18 0824)  Intake/Output from previous day: No intake/output data recorded. Intake/Output this shift: Total I/O In: 2300 [I.V.:1800; IV Piggyback:500] Out: 950 [Urine:650; Blood:300]  Physical exam the patient is alert and pleasant. His strength is 5 over 5 in his bowel deltoid, biceps, and lower extremities.  The patient's dressing is clean and dry. There is no evidence of hematoma or shift.  Lab Results: No results for input(s): WBC, HGB, HCT, PLT in the last 72 hours. BMET No results for input(s): NA, K, CL, CO2, GLUCOSE, BUN, CREATININE, CALCIUM in the last 72 hours.  Studies/Results: Dg Cervical Spine 2-3 Views  Result Date: 10/21/2016 CLINICAL DATA:  Cervical spine surgery. EXAM: CERVICAL SPINE - 2-3 VIEW COMPARISON:  Cervical spine MRI 08/04/2016 and radiographs 07/03/2016 FINDINGS: Three intraoperative cross table lateral radiographs of the cervical spine are provided. The mid and lower cervical spine is poorly visualized due to superimposed shoulder tissues. The first 2 radiographs demonstrate advancement of a needle to the C4-5 disc space level. The final image demonstrates placement of an anterior fusion plate from W4-X3 with screws at C4 and C5. There may be screws at C6 as well, however these are not well seen due to underpenetration. An endotracheal tube is noted. IMPRESSION: Intraoperative images during anterior cervical fusion. Electronically Signed    By: Sebastian Ache M.D.   On: 10/21/2016 13:42    Assessment/Plan: The patient is doing well. I will add oxycodone for pain management. He will likely go home tomorrow.  LOS: 0 days     Harvir Patry D 10/21/2016, 5:34 PM

## 2016-10-22 DIAGNOSIS — M50021 Cervical disc disorder at C4-C5 level with myelopathy: Secondary | ICD-10-CM | POA: Diagnosis not present

## 2016-10-22 NOTE — Progress Notes (Signed)
Patient ID: Brandon [name], male   DOB: 1970/11/19, 46 y.o.   MRN: [id number] Subjective:  Patient is alert and pleasant. He is in no apparent distress. He wants to go home tomorrow.  Objective: Vital signs in last 24 hours: Temp:  [98.4 F (36.9 C)-100 F (37.8 C)] 98.5 F (36.9 C) (04/19 1632) Pulse Rate:  [85-105] 85 (04/19 1632) Resp:  [18-22] 18 (04/19 1632) BP: (104-158)/(57-94) 132/61 (04/19 1632) SpO2:  [96 %-99 %] 99 % (04/19 1632)  Intake/Output from previous day: 04/18 0701 - 04/19 0700 In: 2300 [I.V.:1800; IV Piggyback:500] Out: 950 [Urine:650; Blood:300] Intake/Output this shift: Total I/O In: 600 [P.O.:600] Out: -   Physical exam patient is alert and oriented. His strength is grossly normal and bile deltoid, bicep, and lower extremities.  His dressing is clean and dry. There is no evidence of hematoma or shift.  Lab Results: No results for input(s): WBC, HGB, HCT, PLT in the last 72 hours. BMET No results for input(s): NA, K, CL, CO2, GLUCOSE, BUN, CREATININE, CALCIUM in the last 72 hours.  Studies/Results: Dg Cervical Spine 2-3 Views  Result Date: 10/21/2016 CLINICAL DATA:  Cervical spine surgery. EXAM: CERVICAL SPINE - 2-3 VIEW COMPARISON:  Cervical spine MRI 08/04/2016 and radiographs 07/03/2016 FINDINGS: Three intraoperative cross table lateral radiographs of the cervical spine are provided. The mid and lower cervical spine is poorly visualized due to superimposed shoulder tissues. The first 2 radiographs demonstrate advancement of a needle to the C4-5 disc space level. The final image demonstrates placement of an anterior fusion plate from W0-J8 with screws at C4 and C5. There may be screws at C6 as well, however these are not well seen due to underpenetration. An endotracheal tube is noted. IMPRESSION: Intraoperative images during anterior cervical fusion. Electronically Signed   By: Sebastian Ache M.D.   On: 10/21/2016 13:42    Assessment/Plan: Postop day #1:  the patient is doing well. He will likely home tomorrow.  LOS: 0 days     Lenya Sterne D 10/22/2016, 5:05 PM

## 2016-10-23 DIAGNOSIS — M50021 Cervical disc disorder at C4-C5 level with myelopathy: Secondary | ICD-10-CM | POA: Diagnosis not present

## 2016-10-23 MED ORDER — OXYCODONE HCL 10 MG PO TABS: 10.0000 mg | ORAL_TABLET | ORAL | 0 refills | Status: DC | PRN

## 2016-10-23 NOTE — Progress Notes (Signed)
Pt and friend given D/C instructions with Rx, verbal understanding was provided. Pt's incision is clean and dry with no sign of infection. Pt's IV was removed prior to D/C. Pt D/C'd home via walking @ 1005 per MD order. Pt is stable @ D/C and has no other needs at this time. Rema Fendt, RN

## 2016-10-23 NOTE — Discharge Summary (Signed)
Physician Discharge Summary  Patient ID: Brandon [name] MRN: [id number] DOB/AGE: [birth year] 46 y.o.  Admit date: 10/21/2016 Discharge date: 10/23/2016  Admission Diagnoses:Cervical spondylosis with stenosis  Discharge Diagnoses: Same Active Problems:   Cervical herniated disc   Discharged Condition: good  Hospital Course: Patient was admitted to the hospital underwent anterior cervical discectomies and fusion did very well postoperatively was angling and voiding spontaneously pain was well-controlled by postop day 2 and patient was stable for discharge home scheduled follow-up in one to 2 weeks with Dr. Lovell Sheehan.  Consults: Significant Diagnostic Studies: Treatments: ACDF Discharge Exam: Blood pressure 130/71, pulse 84, temperature 97.8 F (36.6 C), temperature source Oral, resp. rate 20, height 5\' 8"  (1.727 m), weight (!) 136.9 kg (301 lb 14.4 oz), SpO2 96 %. Strength out of 5 wound clean dry and intact  Disposition: Home   Allergies as of 10/23/2016      Reactions   Penicillin G Anaphylaxis   Gets extremely high fevers.   Sulfa Antibiotics Other (See Comments)   It will kill him   Amoxicillin    Cephalosporins Other (See Comments)   Reaction unspecified.   Penicillins Other (See Comments)   fever   Quinolones Other (See Comments)   Reaction unspecified   Sulfamethoxazole Swelling      Medication List    TAKE these medications   aspirin 81 MG tablet Take 81 mg by mouth daily. Reported on 10/15/2015   atorvastatin 20 MG tablet Commonly known as:  LIPITOR Take 1 tablet (20 mg total) by mouth at bedtime. What changed:  additional instructions   cloNIDine 0.1 MG tablet Commonly known as:  CATAPRES Take 0.1 mg by mouth 3 (three) times daily. (0600, 1200, 1800)   colesevelam 625 MG tablet Commonly known as:  WELCHOL Take 3 tablets (1,875 mg total) by mouth daily with breakfast. What changed:  how much to take  when to take this  additional instructions    cyclobenzaprine 30 MG 24 hr capsule Commonly known as:  AMRIX Take 1 capsule (30 mg total) by mouth daily as needed. What changed:  when to take this   diclofenac 75 MG EC tablet Commonly known as:  VOLTAREN Take 1 tablet (75 mg total) by mouth 2 (two) times daily. What changed:  additional instructions   docusate sodium 100 MG capsule Commonly known as:  COLACE Take 2 capsules (200 mg total) by mouth 2 (two) times daily. What changed:  additional instructions   gabapentin 300 MG capsule Commonly known as:  NEURONTIN Take 2 capsules (600 mg total) by mouth 3 (three) times daily. What changed:  when to take this  additional instructions   HYDROcodone-acetaminophen 10-325 MG tablet Commonly known as:  NORCO Take 1 tablet by mouth every 6 (six) hours as needed. What changed:  additional instructions   lisinopril-hydrochlorothiazide 10-12.5 MG tablet Commonly known as:  PRINZIDE,ZESTORETIC Take 1 tablet by mouth daily. What changed:  when to take this  additional instructions   Melatonin 10 MG Tabs Take 10 mg by mouth at bedtime as needed (for sleep.).   metaxalone 800 MG tablet Commonly known as:  SKELAXIN Take 1 tablet (800 mg total) by mouth daily.   metFORMIN 500 MG 24 hr tablet Commonly known as:  GLUCOPHAGE-XR Take 2 tablets (1,000 mg total) by mouth daily with breakfast. What changed:  when to take this  additional instructions   methocarbamol 500 MG tablet Commonly known as:  ROBAXIN Take 1 tablet (500 mg total) by mouth at  bedtime. What changed:  when to take this   naproxen sodium 220 MG tablet Commonly known as:  ANAPROX Take 440 mg by mouth daily as needed (for breakthrough pain.).   NON FORMULARY CPAP machine at bedtime & sleep   Oxycodone HCl 10 MG Tabs Take 1 tablet (10 mg total) by mouth every 3 (three) hours as needed for severe pain.        Signed: [name] P 10/23/2016, 8:39 AM

## 2016-10-23 NOTE — Discharge Instructions (Signed)

## 2016-10-25 ENCOUNTER — Other Ambulatory Visit: Payer: Self-pay | Admitting: Physician Assistant

## 2016-10-27 ENCOUNTER — Encounter (HOSPITAL_COMMUNITY): Payer: Self-pay | Admitting: Neurosurgery

## 2016-12-06 ENCOUNTER — Other Ambulatory Visit: Payer: Self-pay | Admitting: Physician Assistant

## 2016-12-07 NOTE — Telephone Encounter (Signed)
09/26/16 last ov and refill

## 2017-01-20 ENCOUNTER — Telehealth: Payer: Self-pay

## 2017-01-20 ENCOUNTER — Other Ambulatory Visit: Payer: Self-pay | Admitting: Physician Assistant

## 2017-01-20 NOTE — Telephone Encounter (Signed)
Patient made aware, states he will make an appt tomorrow.

## 2017-01-20 NOTE — Telephone Encounter (Signed)
Pharmacy Message:  Amirix ER 30 mg capsule - NON FORMULARY DRUG  Preferred products are: Tizanidine Tab 4 mg, Cyclobenzaprine Tab 10 mg.  Would you like for me to proceed with PA or do you want to change drug to formulary alternative? Please advise.

## 2017-01-20 NOTE — Telephone Encounter (Signed)
Pt should schedule an appt with me to discuss as this was mostly for his shoulder pain and he has had surgery that was causing his pain.

## 2017-02-18 ENCOUNTER — Ambulatory Visit (INDEPENDENT_AMBULATORY_CARE_PROVIDER_SITE_OTHER): Payer: 59 | Admitting: Physician Assistant

## 2017-02-18 ENCOUNTER — Encounter: Payer: Self-pay | Admitting: Physician Assistant

## 2017-02-18 VITALS — BP 136/84 | HR 85 | Temp 98.0°F | Resp 16 | Ht 67.72 in | Wt 299.0 lb

## 2017-02-18 DIAGNOSIS — R7989 Other specified abnormal findings of blood chemistry: Secondary | ICD-10-CM

## 2017-02-18 DIAGNOSIS — G0400 Acute disseminated encephalitis and encephalomyelitis, unspecified: Secondary | ICD-10-CM | POA: Diagnosis not present

## 2017-02-18 DIAGNOSIS — R945 Abnormal results of liver function studies: Secondary | ICD-10-CM

## 2017-02-18 DIAGNOSIS — E119 Type 2 diabetes mellitus without complications: Secondary | ICD-10-CM | POA: Diagnosis not present

## 2017-02-18 DIAGNOSIS — M4802 Spinal stenosis, cervical region: Secondary | ICD-10-CM

## 2017-02-18 DIAGNOSIS — M62838 Other muscle spasm: Secondary | ICD-10-CM | POA: Diagnosis not present

## 2017-02-18 DIAGNOSIS — E781 Pure hyperglyceridemia: Secondary | ICD-10-CM

## 2017-02-18 DIAGNOSIS — G4733 Obstructive sleep apnea (adult) (pediatric): Secondary | ICD-10-CM | POA: Diagnosis not present

## 2017-02-18 DIAGNOSIS — I10 Essential (primary) hypertension: Secondary | ICD-10-CM | POA: Diagnosis not present

## 2017-02-18 DIAGNOSIS — K760 Fatty (change of) liver, not elsewhere classified: Secondary | ICD-10-CM | POA: Diagnosis not present

## 2017-02-18 DIAGNOSIS — E161 Other hypoglycemia: Secondary | ICD-10-CM | POA: Diagnosis not present

## 2017-02-18 DIAGNOSIS — Z9989 Dependence on other enabling machines and devices: Secondary | ICD-10-CM

## 2017-02-18 MED ORDER — LISINOPRIL-HYDROCHLOROTHIAZIDE 10-12.5 MG PO TABS: 1.0000 | ORAL_TABLET | Freq: Every day | ORAL | 1 refills | Status: DC

## 2017-02-18 MED ORDER — METHOCARBAMOL 500 MG PO TABS: 500.0000 mg | ORAL_TABLET | Freq: Every day | ORAL | 1 refills | Status: DC

## 2017-02-18 MED ORDER — COLESEVELAM HCL 625 MG PO TABS: 1875.0000 mg | ORAL_TABLET | Freq: Every day | ORAL | 1 refills | Status: DC

## 2017-02-18 MED ORDER — GABAPENTIN 300 MG PO CAPS: 600.0000 mg | ORAL_CAPSULE | Freq: Four times a day (QID) | ORAL | 0 refills | Status: DC

## 2017-02-18 MED ORDER — METFORMIN HCL ER 500 MG PO TB24: 1000.0000 mg | ORAL_TABLET | Freq: Every day | ORAL | 0 refills | Status: DC

## 2017-02-18 MED ORDER — ATORVASTATIN CALCIUM 20 MG PO TABS: 20.0000 mg | ORAL_TABLET | Freq: Every day | ORAL | 1 refills | Status: DC

## 2017-02-18 MED ORDER — DICLOFENAC SODIUM 75 MG PO TBEC: 75.0000 mg | DELAYED_RELEASE_TABLET | Freq: Two times a day (BID) | ORAL | 1 refills | Status: DC

## 2017-02-18 MED ORDER — CYCLOBENZAPRINE HCL ER 30 MG PO CP24: 30.0000 mg | ORAL_CAPSULE | Freq: Every day | ORAL | 5 refills | Status: DC

## 2017-02-18 NOTE — Patient Instructions (Addendum)
  ASA EC 81mg  a day    IF you received an x-ray today, you will receive an invoice from [facility]. Please contact [facility] at [phone] with questions or concerns regarding your invoice.   IF you received labwork today, you will receive an invoice from [place]. Please contact [facility] at [phone] with questions or concerns regarding your invoice.   Our billing staff will not be able to assist you with questions regarding bills from these companies.  You will be contacted with the lab results as soon as they are available. The fastest way to get your results is to activate your My Chart account. Instructions are located on the last page of this paperwork. If you have not heard from [place] regarding the results in 2 weeks, please contact this office.

## 2017-02-18 NOTE — Progress Notes (Signed)
Brandon [name]  MRN: [id number] DOB: [birth year]  PCP: Mancel Bale, PA-C  Chief Complaint  Patient presents with  . Hypertension    follow-up  . Hyperlipidemia  . Medication Refill    Subjective:  Pt presents to clinic for medication refills.  4/18 - surgery for cervical nerve decompression - doing really well - left shoulder pain has almost resolved - he has good range of motion and very  Happy with the results  Not currently exercising - wanting to go back swimming because he knows it is good for him.  Has only been fair with his medications - when he feels poorly his memory decreases and then he forgets to take his medications.  He has been out of the amrix and he has been using robaxin which does not help quite as much and it makes him sleepy - it does work well for when the amrix seems to be running out before his next scheduled dose.  History is obtained by patient.  Review of Systems  Constitutional: Negative for chills and fever.  Eyes: Negative for visual disturbance.  Respiratory: Negative for cough and shortness of breath.   Cardiovascular: Negative for chest pain, palpitations and leg swelling.  Musculoskeletal: Positive for myalgias (no change for him).  Neurological: Negative for dizziness, light-headedness and headaches.  Psychiatric/Behavioral: Positive for decreased concentration (no change for him).    Patient Active Problem List   Diagnosis Date Noted  . Cervical herniated disc 10/21/2016  . Fatty liver 09/22/2016  . Spinal stenosis in cervical region 08/05/2016  . Hyperinsulinemia 10/15/2015  . Hypertriglyceridemia 10/15/2015  . Benign essential HTN 12/05/2014  . Macroglossia 09/05/2014  . GAD (generalized anxiety disorder) 12/11/2013  . Motor tic disorder 12/11/2013  . OSA on CPAP 10/04/2013  . Hypersomnia with sleep apnea, unspecified 10/04/2013  . High frequency hearing loss 08/24/2013  . Peripheral neuropathy 04/19/2013  . Abnormal gait  10/19/2012  . Cervical myelopathy ([place]) 10/19/2012  . Esophageal reflux 07/27/2011  . Obesity, Class III, BMI 40-49.9 (morbid obesity) ([place]) 07/22/2011  . ADD (attention deficit disorder) 07/22/2011  . ADEM (acute disseminated encephalomyelitis) 07/03/2011  . Splenomegaly 07/03/2011  . Elevated LFTs 07/03/2011    Current Outpatient Prescriptions on File Prior to Visit  Medication Sig Dispense Refill  . aspirin 81 MG tablet Take 81 mg by mouth daily. Reported on 10/15/2015    . cloNIDine (CATAPRES) 0.1 MG tablet Take 0.1 mg by mouth 3 (three) times daily. (0600, 1200, 1800)    . NON FORMULARY CPAP machine at bedtime & sleep     No current facility-administered medications on file prior to visit.     Allergies  Allergen Reactions  . Penicillin G Anaphylaxis    Gets extremely high fevers.  . Sulfa Antibiotics Other (See Comments)    It will kill him  . Amoxicillin   . Cephalosporins Other (See Comments)    Reaction unspecified.  [name] Penicillins Other (See Comments)    fever  . Quinolones Other (See Comments)    Reaction unspecified  . Sulfamethoxazole Swelling    Past Medical History:  Diagnosis Date  . ADEM (acute disseminated encephalomyelitis)   . Allergy   . Anxiety 2005  . Complication of anesthesia   . Depression 2005  . Difficult intubation    difficult intubating and exbutating "throat Clinched around tube"  . Headache(784.0)   . Hearing loss   . Hyperinsulinemia 07/04/11   "I'm on metformin cause I produce too much  insulin"  . Hyperlipidemia   . Hypertension   . Mononucleosis, infectious, with hepatitis 06/2011  . MS (multiple sclerosis) ([place])    patinet has ADEM not MS per Patient  . Nausea    occasional per history form 08/18/11  . Neuromuscular disorder ([place])   . Pneumonia 2010  . Sleep apnea    uses CPAP at home  . Stroke ([place]) 2000   TIA  . TIA (transient ischemic attack) 2000   denies residual  . TIA (transient ischemic attack)    Social  History   Social History Narrative   Lives in [place] with 2 domestic male partners "his family unit"   Caffeine: minimal   Social History  Substance Use Topics  . Smoking status: Former Smoker    Packs/day: 3.00    Years: 3.00    Types: Cigarettes    Quit date: 08/18/1987  . Smokeless tobacco: Never Used  . Alcohol use 0.0 oz/week     Comment: 07/04/11 "glass of wine or spirits once a month"   family history includes Cancer in his maternal grandmother and other; Coronary artery disease in his father; Diabetes in his mother; Stroke in his father.     Objective:  BP 136/84   Pulse 85   Temp 98 F (36.7 C) (Oral)   Resp 16   Ht 5' 7.72" (1.72 m)   Wt 299 lb (135.6 kg)   SpO2 98%   BMI 45.84 kg/m  Body mass index is 45.84 kg/m.  Physical Exam  Constitutional: He is oriented to person, place, and time and well-developed, well-nourished, and in no distress.  HENT:  Head: Normocephalic and atraumatic.  Right Ear: External ear normal.  Left Ear: External ear normal.  Eyes: Conjunctivae are normal.  Neck: Normal range of motion.  Cardiovascular: Normal rate, regular rhythm and normal heart sounds.   No murmur heard. Pulmonary/Chest: Effort normal and breath sounds normal. He has no wheezes.  Neurological: He is alert and oriented to person, place, and time. Gait normal.  Skin: Skin is warm and dry.  Psychiatric: Mood, memory, affect and judgment normal.    Assessment and Plan :  OSA on CPAP  Muscle spasm - Plan: cyclobenzaprine (AMRIX) 30 MG 24 hr capsule, methocarbamol (ROBAXIN) 500 MG tablet  Spinal stenosis in cervical region - Plan: diclofenac (VOLTAREN) 75 MG EC tablet  Hyperinsulinemia - Plan: Hemoglobin A1c, metFORMIN (GLUCOPHAGE-XR) 500 MG 24 hr tablet  Benign essential HTN - Plan: CMP14+EGFR, lisinopril-hydrochlorothiazide (PRINZIDE,ZESTORETIC) 10-12.5 MG tablet  Hypertriglyceridemia - Plan: CMP14+EGFR, Lipid panel, colesevelam (WELCHOL) 625 MG  tablet, atorvastatin (LIPITOR) 20 MG tablet  Fatty liver  ADEM (acute disseminated encephalomyelitis) - Plan: gabapentin (NEURONTIN) 300 MG capsule  Elevated LFTs  Obesity, Class III, BMI 40-49.9 (morbid obesity) (HCC)   Continue current medications - check labs - adjust medications if needed - recheck in 6 months.  Encouraged exercise to help with myalgias and weight as well as the other health benefits.  Windell Hummingbird PA-C  [facility] 02/18/2017 2:19 PM

## 2017-02-19 LAB — CMP14+EGFR
ALT: 99 IU/L — ABNORMAL HIGH (ref 0–44)
AST: 45 IU/L — ABNORMAL HIGH (ref 0–40)
Albumin/Globulin Ratio: 2 (ref 1.2–2.2)
Albumin: 4.7 g/dL (ref 3.5–5.5)
Alkaline Phosphatase: 72 IU/L (ref 39–117)
BUN/Creatinine Ratio: 16 (ref 9–20)
BUN: 13 mg/dL (ref 6–24)
Bilirubin Total: 0.4 mg/dL (ref 0.0–1.2)
CO2: 24 mmol/L (ref 20–29)
Calcium: 9.8 mg/dL (ref 8.7–10.2)
Chloride: 102 mmol/L (ref 96–106)
Creatinine, Ser: 0.81 mg/dL (ref 0.76–1.27)
GFR calc Af Amer: 124 mL/min/{1.73_m2} (ref >59)
GFR calc non Af Amer: 107 mL/min/{1.73_m2} (ref >59)
Globulin, Total: 2.3 g/dL (ref 1.5–4.5)
Glucose: 99 mg/dL (ref 65–99)
Potassium: 3.5 mmol/L (ref 3.5–5.2)
Sodium: 144 mmol/L (ref 134–144)
Total Protein: 7 g/dL (ref 6.0–8.5)

## 2017-02-19 LAB — HEMOGLOBIN A1C
Est. average glucose Bld gHb Est-mCnc: 140 mg/dL
Hgb A1c MFr Bld: 6.5 % — ABNORMAL HIGH (ref 4.8–5.6)

## 2017-02-19 LAB — LIPID PANEL
Chol/HDL Ratio: 4 ratio (ref 0.0–5.0)
Cholesterol, Total: 132 mg/dL (ref 100–199)
HDL: 33 mg/dL — ABNORMAL LOW (ref >39)
LDL Calculated: 32 mg/dL (ref 0–99)
Triglycerides: 337 mg/dL — ABNORMAL HIGH (ref 0–149)
VLDL Cholesterol Cal: 67 mg/dL — ABNORMAL HIGH (ref 5–40)

## 2017-02-22 MED ORDER — METFORMIN HCL ER 500 MG PO TB24: 1000.0000 mg | ORAL_TABLET | Freq: Two times a day (BID) | ORAL | 0 refills | Status: DC

## 2017-02-22 NOTE — Addendum Note (Signed)
Addended by: Morrell Riddle on: 02/22/2017 01:14 PM   Modules accepted: Orders

## 2017-02-23 ENCOUNTER — Telehealth: Payer: Self-pay

## 2017-02-23 NOTE — Telephone Encounter (Signed)
Per [employer] request, started a PA for patient for Amrix ER 30 mg on cover my meds.  Key code is [id number] and should result in 5 business days.

## 2017-02-25 NOTE — Telephone Encounter (Signed)
Judson Roch, Received faxed denial today from [facility] stating that criteria was not met.  I did note on the initial request that patient had tried and failed both skelaxin and flexeril.  Please advise next step... Thank you, Rorey Bisson

## 2017-02-27 ENCOUNTER — Encounter: Payer: Self-pay | Admitting: Physician Assistant

## 2017-02-27 NOTE — Telephone Encounter (Signed)
Please let the patient know - he can call his insurance company to get what he needs to try and fail before they will cover amrix.  Thanks -- Actually I have sent him a [employer].

## 2017-03-23 ENCOUNTER — Ambulatory Visit (INDEPENDENT_AMBULATORY_CARE_PROVIDER_SITE_OTHER): Payer: 59 | Admitting: Physician Assistant

## 2017-03-23 DIAGNOSIS — M62838 Other muscle spasm: Secondary | ICD-10-CM

## 2017-03-23 NOTE — Progress Notes (Signed)
No appt needed today - he was seen last week.

## 2017-03-24 ENCOUNTER — Encounter: Payer: Self-pay | Admitting: Physician Assistant

## 2017-03-30 MED ORDER — TIZANIDINE HCL 4 MG PO CAPS: 4.0000 mg | ORAL_CAPSULE | Freq: Two times a day (BID) | ORAL | 1 refills | Status: DC

## 2017-04-03 ENCOUNTER — Other Ambulatory Visit: Payer: Self-pay | Admitting: Physician Assistant

## 2017-04-03 DIAGNOSIS — E161 Other hypoglycemia: Secondary | ICD-10-CM

## 2017-04-04 ENCOUNTER — Other Ambulatory Visit: Payer: Self-pay | Admitting: Physician Assistant

## 2017-04-04 DIAGNOSIS — E161 Other hypoglycemia: Secondary | ICD-10-CM

## 2017-04-20 ENCOUNTER — Other Ambulatory Visit: Payer: Self-pay | Admitting: Physician Assistant

## 2017-05-03 ENCOUNTER — Ambulatory Visit (INDEPENDENT_AMBULATORY_CARE_PROVIDER_SITE_OTHER): Payer: 59 | Admitting: Neurology

## 2017-05-03 ENCOUNTER — Encounter: Payer: Self-pay | Admitting: Neurology

## 2017-05-03 VITALS — BP 126/75 | HR 61 | Ht 68.0 in | Wt 298.0 lb

## 2017-05-03 DIAGNOSIS — M5030 Other cervical disc degeneration, unspecified cervical region: Secondary | ICD-10-CM | POA: Insufficient documentation

## 2017-05-03 DIAGNOSIS — G0400 Acute disseminated encephalitis and encephalomyelitis, unspecified: Secondary | ICD-10-CM

## 2017-05-03 DIAGNOSIS — G4733 Obstructive sleep apnea (adult) (pediatric): Secondary | ICD-10-CM

## 2017-05-03 DIAGNOSIS — Z9989 Dependence on other enabling machines and devices: Secondary | ICD-10-CM | POA: Diagnosis not present

## 2017-05-03 NOTE — Progress Notes (Signed)
[facility]                                                             SLEEP  MEDICINE  CLINIC                                                                       PATIENT: Brandon [name] DOB: February 17, 1971  REFERRING CLINICIAN:  HISTORY FROM: patient and partner REASON FOR VISIT: follow up on CPAP and compliance, status post recent neck surgery   HISTORICAL  CHIEF COMPLAINT:  Chief Complaint  Patient presents with  . Follow-up    pt alone, rm 10. pt states CPAP is going well. would like to talk about machine. DME [employer]    HISTORY OF PRESENT ILLNESS:   UPDATE 12/05/14:  VP- Since last visit, was dx'd with ADEM by Dr. Orie Rout, possibly related to chicken pox at age 49yrs old vs viral infx at age 61 years old. Also had neuropsych testing in [facility] (dx'd with ADHD). Tried nuvigil, ritalin, concerta in past, without benefit. Having trouble focusing on tasks at home. These issues have worsened in the last 6 months. He has cervical spinal arthritis, reportedly diagnosed by his PCP.   UPDATE 08/10/12: Lost to follow up due to unpaid balance and some mixup. Unfortunately has not had referral to [facility] for PPMS eval. Since last visit, continue RLE weakness. Int numbness in hands. Anxiety is worse. Sleep apnea is better. UPDATE 02/06/11: Symptoms stable.  Cyclobenz helping muscle spasms.  Applying for disability.   UPDATE 09/19/10: Still with worsening gait, muscle twitching, and memory issues.  Doing better on CPAP with OSA.   PRIOR HPI (06/20/10): 46 year old male with history of hypertension, insulin resistance, here for evaluation of abnormal MRI of the brain, tremor and gait difficulty. Birth and development: Born at [redacted] weeks gestation; no complications.  Had chicken pox at age 3 years old.  Numerous antibiotic allergic reactions.  Had trouble focusing in school, but was able to well on exams.  ? ADHD. Age 79 yrs developed intermittent tremor (RUE > LUE).  Some intermittent  muscle spasms, also erratic jerky movements of arms. Age 74-29 yrs developed right leg "dragging" and difficulty walking. Symptoms worse in warm weather.  Possibly had difficulty getting out of a hot tub one time.  Saw Dr. Orlin Hilding in 2006.  MRI brain (10/11/07, 05/10/08) showed periventricular and white matter T2 hyperintensities, suspicious for demyelinating disease.  LP (09/12/03) showed WBC 1, RBC 41, glucose 55, protein 34, VDRL NR, OCB 0, culture neg.   Interval history from 04/28/2016. I have the pleasure of following Brandon [name] for his obstructive sleep apnea treated with CPAP. Due to a mixup in his medical billing he was unable to see me for the last year. He has been using CPAP compliantly 100% of days and 93% of those this over 4 hours of continued use. Average user time is 7 hours 39 minutes, he is using an AutoSet between 5 and 20 cm water pressure with 27  m EPR. He has been 95th percentile pressure of 13.7 cm water which is well in the middle of his pressure window his residual AHI is 1.1. The only improvement possible would be in terms of air leaks as he often seems to lose the air seal -. There was clearly a trend over the month around the first week of October just before he got a new interface. It'll also help to shave!  In the past he has sometimes worked at night and slept in daytime but this has reverted back to a normal circadian rhythm. His overall nocturnal sleep time was over 6 hours. He feels refreshed and restored in the morning. His Epworth sleepiness score was endorsed at only 6 points, his fatigue severity score at 50, he reports that he sometimes chokes for air been sleeping without CPAP. This happens while a passenger, for example. He is also here to inquire about the new trouble friendly mini CPAP machines. I explained to him that the dream station go does not have a humidifier visit, but the compliance data can be obtained through a smart phone, tablet or computer. This technology by  [employer] is called "dream mapping".    Interval history from 05/03/2017, I have the pleasure of seeing Brandon [name] is 100% compliance for CPAP use by days, 87% compliance by hours, averaging 6 hours and 31 minutes at night. His residual AHI is 1.8, the 95th percentile pressure was 15.4 cm water. He is using an AutoSet between 5 and 20 cm water with 2 cm EPR. Brandon [name] reports that his machine but she has now on for almost 5 years was a referred pressure machine which continues to emit a scent of tobacco smoke. He is able to get a new one this year. He may change to another auto- PAP  Without a repeat sleep study?     REVIEW OF SYSTEMS: Full 14 system review of systems performed and notable only for ; High fatigue score on some days, no longer excessively daytime sleepy, choking for air when not sleeping with CPAP and then sleeping in a seated position. Walking with a cane, he likes to sleep slightly elevated or reclined due to his abdominal girth, this also helps with his reflux. Epworth 7 on CPAP Neck pain due to 2 level disc bulging , was on chronic hydrocodone. Walks still wit a 3 pronged cane.   ALLERGIES: Allergies  Allergen Reactions  . Penicillin G Anaphylaxis    Gets extremely high fevers.  . Sulfa Antibiotics Other (See Comments)    It will kill him  . Amoxicillin   . Cephalosporins Other (See Comments)    Reaction unspecified.  [name] Penicillins Other (See Comments)    fever  . Quinolones Other (See Comments)    Reaction unspecified  . Sulfamethoxazole Swelling    HOME MEDICATIONS: Outpatient Medications Prior to Visit  Medication Sig Dispense Refill  . aspirin 81 MG tablet Take 81 mg by mouth daily. Reported on 10/15/2015    . atorvastatin (LIPITOR) 20 MG tablet Take 1 tablet (20 mg total) by mouth at bedtime. (0000) 90 tablet 1  . cloNIDine (CATAPRES) 0.1 MG tablet Take 0.1 mg by mouth 3 (three) times daily. (0600, 1200, 1800)    . colesevelam (WELCHOL) 625 MG tablet Take 3  tablets (1,875 mg total) by mouth daily with breakfast. 540 tablet 1  . diclofenac (VOLTAREN) 75 MG EC tablet Take 1 tablet (75 mg total) by mouth 2 (two) times daily. 180 tablet  1  . gabapentin (NEURONTIN) 300 MG capsule TAKE 2 CAPSULES BY MOUTH TWICE A DAY (Patient taking differently: TAKE 3 CAPSULES BY MOUTH TWICE A DAY) 360 capsule 0  . lisinopril-hydrochlorothiazide (PRINZIDE,ZESTORETIC) 10-12.5 MG tablet Take 1 tablet by mouth at bedtime. 0000 90 tablet 1  . metFORMIN (GLUCOPHAGE-XR) 500 MG 24 hr tablet Take 2 tablets (1,000 mg total) by mouth 2 (two) times daily. (0000) 360 tablet 0  . NON FORMULARY CPAP machine at bedtime & sleep    . tiZANidine (ZANAFLEX) 4 MG capsule Take 1 capsule (4 mg total) by mouth 2 (two) times daily. 180 capsule 1  . cyclobenzaprine (AMRIX) 30 MG 24 hr capsule Take 1 capsule (30 mg total) by mouth at bedtime. 30 capsule 5  . gabapentin (NEURONTIN) 300 MG capsule Take 2 capsules (600 mg total) by mouth 4 (four) times daily. 720 capsule 0   No facility-administered medications prior to visit.     PAST MEDICAL HISTORY: Past Medical History:  Diagnosis Date  . ADEM (acute disseminated encephalomyelitis)   . Allergy   . Anxiety 2005  . Complication of anesthesia   . Depression 2005  . Difficult intubation    difficult intubating and exbutating "throat Clinched around tube"  . Headache(784.0)   . Hearing loss   . Hyperinsulinemia 07/04/11   "I'm on metformin cause I produce too much insulin"  . Hyperlipidemia   . Hypertension   . Mononucleosis, infectious, with hepatitis 06/2011  . MS (multiple sclerosis) ([facility])    patinet has ADEM not MS per Patient  . Nausea    occasional per history form 08/18/11  . Neuromuscular disorder ([facility])   . Pneumonia 2010  . Sleep apnea    uses CPAP at home  . Stroke ([facility]) 2000   TIA  . TIA (transient ischemic attack) 2000   denies residual  . TIA (transient ischemic attack)     PAST SURGICAL HISTORY: Past Surgical  History:  Procedure Laterality Date  . ANTERIOR CERVICAL DECOMP/DISCECTOMY FUSION N/A 10/21/2016   Procedure: ANTERIOR CERVICAL DECOMPRESSION/DISCECTOMY FUSION, INTERBODY PROSTHESIS,PLATE CERVICAL FOUR- CERVICAL FIVE, CERVICAL FIVE- CERVICAL SIX;  Surgeon: Tressie Stalker, MD;  Location: [facility] OR;  Service: Neurosurgery;  Laterality: N/A;  . CHOLECYSTECTOMY  08/02/2011   Procedure: LAPAROSCOPIC CHOLECYSTECTOMY;  Surgeon: Shelly Rubenstein, MD;  Location: [facility] OR;  Service: General;;  . COLONOSCOPY  2003  . ESOPHAGOGASTRODUODENOSCOPY  2003  . ESOPHAGOGASTRODUODENOSCOPY  07/28/2011   Procedure: ESOPHAGOGASTRODUODENOSCOPY (EGD);  Surgeon: [name], MD;  [facility] ENDOSCOPY;  Service: Endoscopy;  Laterality: N/A;  . MYRINGOTOMY  1979; 1980   bilaterally  . TONSILLECTOMY AND ADENOIDECTOMY  ~ 1980    FAMILY HISTORY: Family History  Problem Relation Age of Onset  . Coronary artery disease Father   . Stroke Father   . Diabetes Mother   . Cancer Maternal Grandmother        bone  . Cancer Other        Stomach -Great-great grandmother    SOCIAL HISTORY:  Social History   Social History  . Marital status: Married    Spouse name: N/A  . Number of children: 0  . Years of education: college   Occupational History  . disabled     Social History Main Topics  . Smoking status: Former Smoker    Packs/day: 3.00    Years: 3.00    Types: Cigarettes    Quit date: 08/18/1987  . Smokeless tobacco: Never Used  . Alcohol use 0.0 oz/week  Comment: 07/04/11 "glass of wine or spirits once a month"  . Drug use: Yes    Types: Marijuana     Comment: have not used in a 1.5 years  . Sexual activity: Yes    Partners: Female   Other Topics Concern  . Not on file   Social History Narrative   Lives in [place] with 2 domestic male partners "his family unit"   Caffeine: minimal     PHYSICAL EXAM  Vitals:   05/03/17 0931  BP: 126/75  Pulse: 61  Weight: 298 lb (135.2 kg)  Height: 5'  8" (1.727 m)   Wt Readings from Last 3 Encounters:  05/03/17 298 lb (135.2 kg)  02/18/17 299 lb (135.6 kg)  10/21/16 (!) 301 lb 14.4 oz (136.9 kg)   Body mass index is 45.31 kg/m.  GENERAL EXAM: Patient is in no distress; well developed, nourished and groomed; neck is supple. Mallampati grade 5, uvula is not visible, tongue is in midline without tremor or fasciculation, no retrognathia, large neck circumference of 20.5 inches.   Nose is congested but not obstructed.  Morbidly obese.   CARDIOVASCULAR: Regular rate and rhythm, no murmurs, no carotid bruits  NEUROLOGIC: MENTAL STATUS: awake, alert, but inattentive. language fluent, comprehension intact, naming intact, fund of knowledge appropriate CRANIAL NERVE:pupils equal and reactive to light, visual fields full to confrontation, extraocular muscles intact, no nystagmus,  facial sensation and strength symmetric, hearing intact, palate elevates symmetrically, uvula midline, shoulder shrug symmetric, tongue midline. MOTOR: normal bulk; action tremor RUE; BUE (4 PROX, 3 DISTAL); BLE (3 PROX, 4 DISTAL). Since his neck surgery April 2018 he has been able to lift his right shoulder fully, and to lift his left upper extremity 20 above shoulder level. This is a functional restoration.  SENSORY: normal and symmetric to light touch,  No numbness aside from ADEM baseline.  COORDINATION: finger-nose-finger without ataxia.  REFLEXES: deep tendon reflexes are symmetric, including patella 1 plus and achilles 1 minus.  GAIT/STATION: wide- based gait; walks with triple point cane. Turns with 4 steps to the left and 5 steps to the right.      DIAGNOSTIC DATA (LABS, IMAGING, TESTING) - I reviewed patient records, labs, notes, testing and imaging myself where available.  Lab Results  Component Value Date   WBC 6.2 10/12/2016   HGB 14.8 10/12/2016   HCT 43.5 10/12/2016   MCV 82.5 10/12/2016   PLT 151 10/12/2016      Component Value Date/Time     NA 144 02/18/2017 1023   K 3.5 02/18/2017 1023   CL 102 02/18/2017 1023   CO2 24 02/18/2017 1023   GLUCOSE 99 02/18/2017 1023   GLUCOSE 90 10/12/2016 1441   BUN 13 02/18/2017 1023   CREATININE 0.81 02/18/2017 1023   CREATININE 0.83 10/15/2015 1112   CALCIUM 9.8 02/18/2017 1023   PROT 7.0 02/18/2017 1023   ALBUMIN 4.7 02/18/2017 1023   AST 45 (H) 02/18/2017 1023   ALT 99 (H) 02/18/2017 1023   ALKPHOS 72 02/18/2017 1023   BILITOT 0.4 02/18/2017 1023   GFRNONAA 107 02/18/2017 1023   GFRNONAA >89 07/18/2015 1656   GFRAA 124 02/18/2017 1023   GFRAA >89 07/18/2015 1656   Lab Results  Component Value Date   CHOL 132 02/18/2017   HDL 33 (L) 02/18/2017   LDLCALC 32 02/18/2017   TRIG 337 (H) 02/18/2017   CHOLHDL 4.0 02/18/2017   Lab Results  Component Value Date   HGBA1C 6.5 (H) 02/18/2017  No results found for: VITAMINB12 Lab Results  Component Value Date   TSH 3.190 09/17/2016       ASSESSMENT AND PLAN  46 y.o. year old male here with Dx:  OSA on CPAP, obesity hypoventilation, orthopnea, ADEM, DDD of the cervical spine, status post anterior  fusion.     PLAN:  1) continue  To use  Auto CPAP for treatment of OSA , will prescribe a new machine . -  importance of diet- low carb , no added sugars, physical activity, fitness, organization techniques reviewed. May be a candidate for Dr. Quillian Quincearen Beasley  's weight management. Has begun swimming.   2) DDD, status post fusion, restoring partially the ROM to the left shoulder and ROM/ lifting and rotation.  3) ADEM     [facility]     I spent 30 minutes of face to face time with patient. Greater than 50% of time was spent in counseling and coordination of care with patient.  RV with Np in 12 month, for OSA and CPAP.       Melvyn Novasarmen Oneta Sigman, MD

## 2017-05-21 ENCOUNTER — Telehealth: Payer: Self-pay | Admitting: Neurology

## 2017-05-21 NOTE — Telephone Encounter (Signed)
error 

## 2017-05-25 ENCOUNTER — Other Ambulatory Visit: Payer: Self-pay | Admitting: Physician Assistant

## 2017-05-25 DIAGNOSIS — E161 Other hypoglycemia: Secondary | ICD-10-CM

## 2017-06-04 ENCOUNTER — Telehealth: Payer: Self-pay | Admitting: Neurology

## 2017-06-04 NOTE — Telephone Encounter (Signed)
I have forwarded the correct information thru a staff message.

## 2017-06-04 NOTE — Telephone Encounter (Signed)
Tiffany/[facility] [phone] called needs clarification whether to follow the order that read auto titration CPAP 5-18cm as opposed to the chart note reads 5-20 cm Please call

## 2017-07-08 ENCOUNTER — Other Ambulatory Visit: Payer: Self-pay

## 2017-07-08 ENCOUNTER — Ambulatory Visit (INDEPENDENT_AMBULATORY_CARE_PROVIDER_SITE_OTHER): Payer: 59

## 2017-07-08 ENCOUNTER — Encounter: Payer: Self-pay | Admitting: Physician Assistant

## 2017-07-08 ENCOUNTER — Ambulatory Visit: Payer: 59 | Admitting: Physician Assistant

## 2017-07-08 VITALS — BP 121/74 | HR 74 | Temp 98.3°F | Resp 16 | Ht 68.0 in | Wt 303.0 lb

## 2017-07-08 DIAGNOSIS — R042 Hemoptysis: Secondary | ICD-10-CM | POA: Diagnosis not present

## 2017-07-08 DIAGNOSIS — J22 Unspecified acute lower respiratory infection: Secondary | ICD-10-CM

## 2017-07-08 DIAGNOSIS — R05 Cough: Secondary | ICD-10-CM | POA: Diagnosis not present

## 2017-07-08 DIAGNOSIS — R058 Other specified cough: Secondary | ICD-10-CM

## 2017-07-08 DIAGNOSIS — R0982 Postnasal drip: Secondary | ICD-10-CM | POA: Diagnosis not present

## 2017-07-08 MED ORDER — AZITHROMYCIN 250 MG PO TABS: ORAL_TABLET | ORAL | 0 refills | Status: DC

## 2017-07-08 MED ORDER — PREDNISONE 20 MG PO TABS: ORAL_TABLET | ORAL | 0 refills | Status: DC

## 2017-07-08 MED ORDER — IPRATROPIUM BROMIDE 0.03 % NA SOLN: 2.0000 | Freq: Two times a day (BID) | NASAL | 0 refills | Status: DC

## 2017-07-08 NOTE — Progress Notes (Signed)
PRIMARY CARE AT [facility] [address], [place] [address] [phone]  Date:  07/08/2017   Name:  Brandon [name]   DOB:  18-Jun-1971   MRN:  [id number]  PCP:  Morrell Riddle, PA-C    History of Present Illness:  Brandon [name] is a 47 y.o. male patient who presents to PCP with  Chief Complaint  Patient presents with  . Cough    productive,yellow mixed with blood/ x 2 wks  . Generalized Body Aches    x 2 wks     Post nasal drip, without nasal congestion.   Last 2 weeks, productive yellow sputum, with some blood tinge.   Feels like it is in his upper chest.   Three times per day, he will force productive cough with pushing on his chest.   He has some muscle fatigue.  No fever.  He has some night sweats.   sojme ear discomfort.    Patient Active Problem List   Diagnosis Date Noted  . DDD (degenerative disc disease), cervical 05/03/2017  . Cervical herniated disc 10/21/2016  . Fatty liver 09/22/2016  . Spinal stenosis in cervical region 08/05/2016  . Diabetes type 2, controlled (HCC) 10/15/2015  . Hypertriglyceridemia 10/15/2015  . Benign essential HTN 12/05/2014  . Macroglossia 09/05/2014  . GAD (generalized anxiety disorder) 12/11/2013  . Motor tic disorder 12/11/2013  . OSA on CPAP 10/04/2013  . Hypersomnia with sleep apnea, unspecified 10/04/2013  . High frequency hearing loss 08/24/2013  . Peripheral neuropathy 04/19/2013  . Abnormal gait 10/19/2012  . Cervical myelopathy (HCC) 10/19/2012  . Esophageal reflux 07/27/2011  . Morbid obesity due to excess calories (HCC) 07/22/2011  . ADD (attention deficit disorder) 07/22/2011  . ADEM (acute disseminated encephalomyelitis) 07/03/2011  . Splenomegaly 07/03/2011  . Elevated LFTs 07/03/2011    Past Medical History:  Diagnosis Date  . ADEM (acute disseminated encephalomyelitis)   . Allergy   . Anxiety 2005  . Complication of anesthesia   . Depression 2005  . Difficult intubation    difficult intubating and  exbutating "throat Clinched around tube"  . Headache(784.0)   . Hearing loss   . Hyperinsulinemia 07/04/11   "I'm on metformin cause I produce too much insulin"  . Hyperlipidemia   . Hypertension   . Mononucleosis, infectious, with hepatitis 06/2011  . MS (multiple sclerosis) (HCC)    patinet has ADEM not MS per Patient  . Nausea    occasional per history form 08/18/11  . Neuromuscular disorder (HCC)   . Pneumonia 2010  . Sleep apnea    uses CPAP at home  . Stroke (HCC) 2000   TIA  . TIA (transient ischemic attack) 2000   denies residual  . TIA (transient ischemic attack)     Past Surgical History:  Procedure Laterality Date  . ANTERIOR CERVICAL DECOMP/DISCECTOMY FUSION N/A 10/21/2016   Procedure: ANTERIOR CERVICAL DECOMPRESSION/DISCECTOMY FUSION, INTERBODY PROSTHESIS,PLATE CERVICAL FOUR- CERVICAL FIVE, CERVICAL FIVE- CERVICAL SIX;  Surgeon: Tressie Stalker, MD;  Location: [facility] OR;  Service: Neurosurgery;  Laterality: N/A;  . CHOLECYSTECTOMY  08/02/2011   Procedure: LAPAROSCOPIC CHOLECYSTECTOMY;  Surgeon: Shelly Rubenstein, MD;  Location: [facility] OR;  Service: General;;  . COLONOSCOPY  2003  . ESOPHAGOGASTRODUODENOSCOPY  2003  . ESOPHAGOGASTRODUODENOSCOPY  07/28/2011   Procedure: ESOPHAGOGASTRODUODENOSCOPY (EGD);  Surgeon: [name], MD;  Location: [name] ENDOSCOPY;  Service: Endoscopy;  Laterality: N/A;  . MYRINGOTOMY  1979; 1980   bilaterally  . TONSILLECTOMY AND ADENOIDECTOMY  ~ 1980  Social History   Tobacco Use  . Smoking status: Former Smoker    Packs/day: 3.00    Years: 3.00    Pack years: 9.00    Types: Cigarettes    Last attempt to quit: 08/18/1987    Years since quitting: 29.9  . Smokeless tobacco: Never Used  Substance Use Topics  . Alcohol use: Yes    Alcohol/week: 0.0 oz    Comment: 07/04/11 "glass of wine or spirits once a month"  . Drug use: Yes    Types: Marijuana    Comment: have not used in a 1.5 years    Family History  Problem Relation Age of Onset   . Coronary artery disease Father   . Stroke Father   . Diabetes Mother   . Cancer Maternal Grandmother        bone  . Cancer Other        Stomach -Great-great grandmother    Allergies  Allergen Reactions  . Penicillin G Anaphylaxis    Gets extremely high fevers.  . Sulfa Antibiotics Other (See Comments)    It will kill him  . Amoxicillin   . Cephalosporins Other (See Comments)    Reaction unspecified.  [name] Penicillins Other (See Comments)    fever  . Quinolones Other (See Comments)    Reaction unspecified  . Sulfamethoxazole Swelling    Medication list has been reviewed and updated.  Current Outpatient Medications on File Prior to Visit  Medication Sig Dispense Refill  . atorvastatin (LIPITOR) 20 MG tablet Take 1 tablet (20 mg total) by mouth at bedtime. (0000) 90 tablet 1  . cloNIDine (CATAPRES) 0.1 MG tablet Take 0.1 mg by mouth 3 (three) times daily. (0600, 1200, 1800)    . colesevelam (WELCHOL) 625 MG tablet Take 3 tablets (1,875 mg total) by mouth daily with breakfast. 540 tablet 1  . diclofenac (VOLTAREN) 75 MG EC tablet Take 1 tablet (75 mg total) by mouth 2 (two) times daily. 180 tablet 1  . gabapentin (NEURONTIN) 300 MG capsule TAKE 2 CAPSULES BY MOUTH TWICE A DAY (Patient taking differently: TAKE 3 CAPSULES BY MOUTH TWICE A DAY) 360 capsule 0  . lisinopril-hydrochlorothiazide (PRINZIDE,ZESTORETIC) 10-12.5 MG tablet Take 1 tablet by mouth at bedtime. 0000 90 tablet 1  . metFORMIN (GLUCOPHAGE-XR) 500 MG 24 hr tablet TAKE 2 TABLETS (1,000 MG TOTAL) BY MOUTH 2 (TWO) TIMES DAILY. 360 tablet 0  . NON FORMULARY CPAP machine at bedtime & sleep    . tiZANidine (ZANAFLEX) 4 MG capsule Take 1 capsule (4 mg total) by mouth 2 (two) times daily. 180 capsule 1  . aspirin 81 MG tablet Take 81 mg by mouth daily. Reported on 10/15/2015     No current facility-administered medications on file prior to visit.     ROS ROS otherwise unremarkable unless listed above.  Physical  Examination: BP 121/74   Pulse 74   Temp 98.3 F (36.8 C) (Oral)   Resp 16   Ht 5\' 8"  (1.727 m)   Wt (!) 303 lb (137.4 kg)   SpO2 98%   BMI 46.07 kg/m  Ideal Body Weight: Weight in (lb) to have BMI = 25: 164.1  Physical Exam  Constitutional: He is oriented to person, place, and time. He appears well-developed and well-nourished. No distress.  HENT:  Head: Atraumatic.  Right Ear: Tympanic membrane, external ear and ear canal normal.  Left Ear: Tympanic membrane, external ear and ear canal normal.  Nose: Mucosal edema and rhinorrhea present. Right  sinus exhibits no maxillary sinus tenderness and no frontal sinus tenderness. Left sinus exhibits no maxillary sinus tenderness and no frontal sinus tenderness.  Mouth/Throat: No uvula swelling. No oropharyngeal exudate, posterior oropharyngeal edema or posterior oropharyngeal erythema.  Eyes: Conjunctivae, EOM and lids are normal. Pupils are equal, round, and reactive to light. Right eye exhibits normal extraocular motion. Left eye exhibits normal extraocular motion.  Neck: Trachea normal and full passive range of motion without pain. No edema and no erythema present.  Cardiovascular: Normal rate.  Pulmonary/Chest: Effort normal. No respiratory distress. He has no decreased breath sounds. He has no wheezes (very mild expiratory). He has no rhonchi.  Neurological: He is alert and oriented to person, place, and time.  Skin: Skin is warm and dry. He is not diaphoretic.  Psychiatric: He has a normal mood and affect. His behavior is normal.    Dg Chest 2 View  Result Date: 07/08/2017 CLINICAL DATA:  Productive cough.  Hemoptysis. EXAM: CHEST  2 VIEW COMPARISON:  Chest x-ray 08/02/2011 . FINDINGS: Mediastinum and hilar structures normal. Lungs are clear. Heart size normal. No pleural effusion or pneumothorax. Thoracic spine scoliosis and degenerative change. Prior cervical spine fusion . IMPRESSION: No acute cardiopulmonary disease. Electronically  Signed   By: Maisie Fus  Register   On: 07/08/2017 14:37     Assessment and Plan: Brandon [name] is a 47 y.o. male who is here today for cc of  Chief Complaint  Patient presents with  . Cough    productive,yellow mixed with blood/ x 2 wks  . Generalized Body Aches    x 2 wks  possible lower respiratory infection.  With history and co-morbidities, will treat for bacterial etiology though this could be post nasal drip.  Lower respiratory infection (e.g., bronchitis, pneumonia, pneumonitis, pulmonitis) - Plan: predniSONE (DELTASONE) 20 MG tablet, azithromycin (ZITHROMAX) 250 MG tablet, ipratropium (ATROVENT) 0.03 % nasal spray  Productive cough - Plan: DG Chest 2 View  Hemoptysis - Plan: DG Chest 2 View  Post-nasal drip - Plan: ipratropium (ATROVENT) 0.03 % nasal spray  Trena Platt, PA-C [facility] 1/19/20195:24 PM

## 2017-07-08 NOTE — Patient Instructions (Addendum)
Please hydrate well.  Please use the nasal spray as prescribed.  Acute Bronchitis, Adult Acute bronchitis is when air tubes (bronchi) in the lungs suddenly get swollen. The condition can make it hard to breathe. It can also cause these symptoms:  A cough.  Coughing up clear, yellow, or green mucus.  Wheezing.  Chest congestion.  Shortness of breath.  A fever.  Body aches.  Chills.  A sore throat.  Follow these instructions at home: Medicines  Take over-the-counter and prescription medicines only as told by your doctor.  If you were prescribed an antibiotic medicine, take it as told by your doctor. Do not stop taking the antibiotic even if you start to feel better. General instructions  Rest.  Drink enough fluids to keep your pee (urine) clear or pale yellow.  Avoid smoking and secondhand smoke. If you smoke and you need help quitting, ask your doctor. Quitting will help your lungs heal faster.  Use an inhaler, cool mist vaporizer, or humidifier as told by your doctor.  Keep all follow-up visits as told by your doctor. This is important. How is this prevented? To lower your risk of getting this condition again:  Wash your hands often with soap and water. If you cannot use soap and water, use hand sanitizer.  Avoid contact with people who have cold symptoms.  Try not to touch your hands to your mouth, nose, or eyes.  Make sure to get the flu shot every year.  Contact a doctor if:  Your symptoms do not get better in 2 weeks. Get help right away if:  You cough up blood.  You have chest pain.  You have very bad shortness of breath.  You become dehydrated.  You faint (pass out) or keep feeling like you are going to pass out.  You keep throwing up (vomiting).  You have a very bad headache.  Your fever or chills gets worse. This information is not intended to replace advice given to you by your health care provider. Make sure you discuss any questions  you have with your health care provider. Document Released: 12/09/2007 Document Revised: 01/29/2016 Document Reviewed: 12/11/2015 Elsevier Interactive Patient Education  2018 [employer].    IF you received an x-ray today, you will receive an invoice from [facility]. Please contact [facility] at [phone] with questions or concerns regarding your invoice.   IF you received labwork today, you will receive an invoice from [facility]. Please contact [facility] at [phone] with questions or concerns regarding your invoice.   Our billing staff will not be able to assist you with questions regarding bills from these companies.  You will be contacted with the lab results as soon as they are available. The fastest way to get your results is to activate your My Chart account. Instructions are located on the last page of this paperwork. If you have not heard from [place] regarding the results in 2 weeks, please contact this office.

## 2017-07-27 ENCOUNTER — Other Ambulatory Visit: Payer: Self-pay | Admitting: Physician Assistant

## 2017-07-27 NOTE — Telephone Encounter (Signed)
Pt requesting refill on gabapentin, last seen 07/08/17 by Judeth Cornfield and last saw you 03/23/17. Pls advise

## 2017-07-27 NOTE — Telephone Encounter (Signed)
Done

## 2017-08-05 ENCOUNTER — Other Ambulatory Visit: Payer: Self-pay | Admitting: Physician Assistant

## 2017-08-05 DIAGNOSIS — Z20828 Contact with and (suspected) exposure to other viral communicable diseases: Secondary | ICD-10-CM

## 2017-08-05 MED ORDER — OSELTAMIVIR PHOSPHATE 75 MG PO CAPS: 75.0000 mg | ORAL_CAPSULE | Freq: Every day | ORAL | 0 refills | Status: DC

## 2017-08-05 NOTE — Progress Notes (Signed)
Patient is living with current patient I am treating.  Exposure to influenza.

## 2017-08-12 ENCOUNTER — Encounter (INDEPENDENT_AMBULATORY_CARE_PROVIDER_SITE_OTHER): Payer: 59

## 2017-08-24 ENCOUNTER — Ambulatory Visit: Payer: 59 | Admitting: Physician Assistant

## 2017-08-24 ENCOUNTER — Other Ambulatory Visit: Payer: Self-pay

## 2017-08-24 ENCOUNTER — Encounter: Payer: Self-pay | Admitting: Physician Assistant

## 2017-08-24 VITALS — BP 140/90 | HR 59 | Temp 98.2°F | Resp 16 | Ht 68.0 in | Wt 301.4 lb

## 2017-08-24 DIAGNOSIS — F958 Other tic disorders: Secondary | ICD-10-CM

## 2017-08-24 DIAGNOSIS — E781 Pure hyperglyceridemia: Secondary | ICD-10-CM

## 2017-08-24 DIAGNOSIS — I10 Essential (primary) hypertension: Secondary | ICD-10-CM

## 2017-08-24 DIAGNOSIS — G4733 Obstructive sleep apnea (adult) (pediatric): Secondary | ICD-10-CM

## 2017-08-24 DIAGNOSIS — E119 Type 2 diabetes mellitus without complications: Secondary | ICD-10-CM | POA: Diagnosis not present

## 2017-08-24 DIAGNOSIS — Z23 Encounter for immunization: Secondary | ICD-10-CM

## 2017-08-24 DIAGNOSIS — M5030 Other cervical disc degeneration, unspecified cervical region: Secondary | ICD-10-CM

## 2017-08-24 DIAGNOSIS — R945 Abnormal results of liver function studies: Secondary | ICD-10-CM | POA: Diagnosis not present

## 2017-08-24 DIAGNOSIS — E161 Other hypoglycemia: Secondary | ICD-10-CM

## 2017-08-24 DIAGNOSIS — G0401 Postinfectious acute disseminated encephalitis and encephalomyelitis (postinfectious ADEM): Secondary | ICD-10-CM

## 2017-08-24 DIAGNOSIS — Z9989 Dependence on other enabling machines and devices: Secondary | ICD-10-CM

## 2017-08-24 DIAGNOSIS — M4802 Spinal stenosis, cervical region: Secondary | ICD-10-CM

## 2017-08-24 DIAGNOSIS — R7989 Other specified abnormal findings of blood chemistry: Secondary | ICD-10-CM

## 2017-08-24 MED ORDER — GABAPENTIN 300 MG PO CAPS
900.0000 mg | ORAL_CAPSULE | Freq: Two times a day (BID) | ORAL | 1 refills | Status: DC
Start: 2017-08-24 — End: 2022-09-01

## 2017-08-24 MED ORDER — TIZANIDINE HCL 4 MG PO CAPS
4.0000 mg | ORAL_CAPSULE | Freq: Two times a day (BID) | ORAL | 1 refills | Status: DC
Start: 2017-08-24 — End: 2017-11-19

## 2017-08-24 MED ORDER — LISINOPRIL-HYDROCHLOROTHIAZIDE 10-12.5 MG PO TABS: 1.0000 | ORAL_TABLET | Freq: Every day | ORAL | 1 refills | Status: DC

## 2017-08-24 MED ORDER — ATORVASTATIN CALCIUM 20 MG PO TABS: 20.0000 mg | ORAL_TABLET | Freq: Every day | ORAL | 1 refills | Status: DC

## 2017-08-24 MED ORDER — DICLOFENAC SODIUM 75 MG PO TBEC: 75.0000 mg | DELAYED_RELEASE_TABLET | Freq: Two times a day (BID) | ORAL | 1 refills | Status: DC

## 2017-08-24 MED ORDER — CLONIDINE HCL 0.1 MG PO TABS: 0.1000 mg | ORAL_TABLET | Freq: Three times a day (TID) | ORAL | 1 refills | Status: DC

## 2017-08-24 MED ORDER — COLESEVELAM HCL 625 MG PO TABS: 1875.0000 mg | ORAL_TABLET | Freq: Every day | ORAL | 1 refills | Status: DC

## 2017-08-24 MED ORDER — METFORMIN HCL ER 500 MG PO TB24: ORAL_TABLET | ORAL | 1 refills | Status: DC

## 2017-08-24 NOTE — Patient Instructions (Addendum)
Liquid magnesium  -- relaxing Calm (brand that I use) Increase water    IF you received an x-ray today, you will receive an invoice from [facility]. Please contact [facility] at [phone] with questions or concerns regarding your invoice.   IF you received labwork today, you will receive an invoice from [facility]. Please contact [facility] at [phone] with questions or concerns regarding your invoice.   Our billing staff will not be able to assist you with questions regarding bills from these companies.  You will be contacted with the lab results as soon as they are available. The fastest way to get your results is to activate your My Chart account. Instructions are located on the last page of this paperwork. If you have not heard from [place] regarding the results in 2 weeks, please contact this office.

## 2017-08-24 NOTE — Progress Notes (Signed)
Brandon [name]  MRN: [id number] DOB: Jan 10, 1971  PCP: Morrell Riddle, PA-C  Chief Complaint  Patient presents with  . medication check    need clonidine    Subjective:  Pt presents to clinic for medication refill and form for insurance that needs to be filled out.  He has been doing good.  No exercise recently - has a membership but does not really use it.  He ran out of his Clonidine that the neurologist put him on for his focus and he would like to go back on that because he feels like since he went off his focus has decreased.  He checks his BP at [employer] when he goes and it runs in the 140/90s.  He is not sure if it got higher when he stopped the clonidine - he has also not taken any of his medications today.  Really misses his Amrix - the Zanaflex helps but if he takes it a little more than 12h he has muscle spasms and with the Amrix he felt like it was more forgiving.  History is obtained by patient.  Review of Systems  Constitutional: Negative for chills and fever.  Eyes: Negative for visual disturbance.  Respiratory: Negative for cough and shortness of breath.   Cardiovascular: Negative for chest pain, palpitations and leg swelling.  Neurological: Negative for dizziness, light-headedness, numbness and headaches.    Patient Active Problem List   Diagnosis Date Noted  . DDD (degenerative disc disease), cervical 05/03/2017  . Cervical herniated disc 10/21/2016  . Fatty liver 09/22/2016  . Spinal stenosis in cervical region 08/05/2016  . Diabetes type 2, controlled (HCC) 10/15/2015  . Hypertriglyceridemia 10/15/2015  . Benign essential HTN 12/05/2014  . Macroglossia 09/05/2014  . GAD (generalized anxiety disorder) 12/11/2013  . Motor tic disorder 12/11/2013  . OSA on CPAP 10/04/2013  . Hypersomnia with sleep apnea, unspecified 10/04/2013  . High frequency hearing loss 08/24/2013  . Peripheral neuropathy 04/19/2013  . Abnormal gait 10/19/2012  . Cervical  myelopathy (HCC) 10/19/2012  . Esophageal reflux 07/27/2011  . Morbid obesity due to excess calories (HCC) 07/22/2011  . ADD (attention deficit disorder) 07/22/2011  . ADEM (acute disseminated encephalomyelitis) 07/03/2011  . Splenomegaly 07/03/2011  . Elevated LFTs 07/03/2011    Current Outpatient Medications on File Prior to Visit  Medication Sig Dispense Refill  . NON FORMULARY CPAP machine at bedtime & sleep     No current facility-administered medications on file prior to visit.     Allergies  Allergen Reactions  . Penicillin G Anaphylaxis    Gets extremely high fevers.  . Sulfa Antibiotics Other (See Comments)    It will kill him  . Amoxicillin   . Cephalosporins Other (See Comments)    Reaction unspecified.  . Quinolones Other (See Comments)    Reaction unspecified  . Sulfamethoxazole Swelling    Past Medical History:  Diagnosis Date  . ADEM (acute disseminated encephalomyelitis)   . Allergy   . Anxiety 2005  . Complication of anesthesia   . Depression 2005  . Difficult intubation    difficult intubating and exbutating "throat Clinched around tube"  . Headache(784.0)   . Hearing loss   . Hyperinsulinemia 07/04/11   "I'm on metformin cause I produce too much insulin"  . Hyperlipidemia   . Hypertension   . Mononucleosis, infectious, with hepatitis 06/2011  . MS (multiple sclerosis) (HCC)    patinet has ADEM not MS per Patient  . Nausea  occasional per history form 08/18/11  . Neuromuscular disorder (HCC)   . Pneumonia 2010  . Sleep apnea    uses CPAP at home  . Stroke (HCC) 2000   TIA  . TIA (transient ischemic attack) 2000   denies residual  . TIA (transient ischemic attack)    Social History   Social History Narrative   Lives in [place] with 2 domestic male partners "his family unit"   Caffeine: minimal   Social History   Tobacco Use  . Smoking status: Former Smoker    Packs/day: 3.00    Years: 3.00    Pack years: 9.00    Types:  Cigarettes    Last attempt to quit: 08/18/1987    Years since quitting: 30.0  . Smokeless tobacco: Never Used  Substance Use Topics  . Alcohol use: Yes    Alcohol/week: 0.0 oz    Comment: 07/04/11 "glass of wine or spirits once a month"  . Drug use: Yes    Types: Marijuana    Comment: have not used in a 1.5 years   family history includes Cancer in his maternal grandmother and other; Coronary artery disease in his father; Diabetes in his mother; Stroke in his father.     Objective:  BP (!) 150/90   Pulse (!) 59   Temp 98.2 F (36.8 C) (Oral)   Resp 16   Ht 5\' 8"  (1.727 m)   Wt (!) 301 lb 6.4 oz (136.7 kg)   SpO2 97%   BMI 45.83 kg/m  Body mass index is 45.83 kg/m.   Wt Readings from Last 3 Encounters:  08/24/17 (!) 301 lb 6.4 oz (136.7 kg)  07/08/17 (!) 303 lb (137.4 kg)  05/03/17 298 lb (135.2 kg)     Physical Exam  Constitutional: He is oriented to person, place, and time and well-developed, well-nourished, and in no distress.  HENT:  Head: Normocephalic and atraumatic.  Right Ear: External ear normal.  Left Ear: External ear normal.  Eyes: Conjunctivae are normal.  Neck: Normal range of motion.  Cardiovascular: Normal rate, regular rhythm, normal heart sounds and intact distal pulses.  Pulmonary/Chest: Effort normal and breath sounds normal. He has no wheezes.  Musculoskeletal:       Right lower leg: He exhibits no edema.       Left lower leg: He exhibits no edema.  Neurological: He is alert and oriented to person, place, and time.  Walks with cane  Skin: Skin is warm and dry.  Psychiatric: Mood, memory, affect and judgment normal.    Assessment and Plan :  Flu vaccine need - Plan: Flu Vaccine QUAD 36+ mos IM  Benign essential HTN - Plan: lisinopril-hydrochlorothiazide (PRINZIDE,ZESTORETIC) 10-12.5 MG tablet  Controlled type 2 diabetes mellitus without complication, without long-term current use of insulin (HCC) - Plan: Hemoglobin A1c, HM DIABETES FOOT  EXAM  ADEM (acute disseminated encephalomyelitis) (postinfectious) - Plan: cloNIDine (CATAPRES) 0.1 MG tablet  Morbid obesity due to excess calories (HCC)  Hypertriglyceridemia - Plan: Lipid panel, colesevelam (WELCHOL) 625 MG tablet, atorvastatin (LIPITOR) 20 MG tablet  Elevated LFTs - Plan: Comprehensive metabolic panel  OSA on CPAP  Hyperinsulinemia - Plan: metFORMIN (GLUCOPHAGE-XR) 500 MG 24 hr tablet  Spinal stenosis in cervical region - Plan: diclofenac (VOLTAREN) 75 MG EC tablet  Motor tic disorder - Plan: cloNIDine (CATAPRES) 0.1 MG tablet  DDD (degenerative disc disease), cervical - Plan: tiZANidine (ZANAFLEX) 4 MG capsule, gabapentin (NEURONTIN) 300 MG capsule   Encouraged exercise esp swimming as  it will be good overall for him.  Paperwork filled out.  Monitor BP at home - once the clonidine is started he will monitor his BP if it does not improved he will recheck with me in 3-4 weeks once he gets up to tid dosing - if it does improve he will recheck with me in 3 months. Medications refilled as above.  Check labs as above and his medications will be adjusted if needed.  Benny Lennert PA-C  Primary Care at [facility] 08/24/2017 3:11 PM

## 2017-08-25 LAB — COMPREHENSIVE METABOLIC PANEL
AG Ratio: 1.9 (calc) (ref 1.0–2.5)
ALT: 76 U/L — ABNORMAL HIGH (ref 9–46)
AST: 36 U/L (ref 10–40)
Albumin: 4.6 g/dL (ref 3.6–5.1)
Alkaline phosphatase (APISO): 47 U/L (ref 40–115)
BUN: 13 mg/dL (ref 7–25)
CO2: 28 mmol/L (ref 20–32)
Calcium: 9.9 mg/dL (ref 8.6–10.3)
Chloride: 104 mmol/L (ref 98–110)
Creat: 1.02 mg/dL (ref 0.60–1.35)
Globulin: 2.4 g/dL (calc) (ref 1.9–3.7)
Glucose, Bld: 88 mg/dL (ref 65–99)
Potassium: 3.9 mmol/L (ref 3.5–5.3)
Sodium: 141 mmol/L (ref 135–146)
Total Bilirubin: 0.8 mg/dL (ref 0.2–1.2)
Total Protein: 7 g/dL (ref 6.1–8.1)

## 2017-08-25 LAB — LIPID PANEL
Cholesterol: 119 mg/dL (ref <200)
HDL: 35 mg/dL — ABNORMAL LOW (ref >40)
LDL Cholesterol (Calc): 57 mg/dL (calc)
Non-HDL Cholesterol (Calc): 84 mg/dL (calc) (ref <130)
Total CHOL/HDL Ratio: 3.4 (calc) (ref <5.0)
Triglycerides: 196 mg/dL — ABNORMAL HIGH (ref <150)

## 2017-08-25 LAB — HEMOGLOBIN A1C
Hgb A1c MFr Bld: 5.6 % of total Hgb (ref <5.7)
Mean Plasma Glucose: 114 (calc)
eAG (mmol/L): 6.3 (calc)

## 2017-09-01 ENCOUNTER — Encounter (INDEPENDENT_AMBULATORY_CARE_PROVIDER_SITE_OTHER): Payer: 59

## 2017-09-15 ENCOUNTER — Encounter (INDEPENDENT_AMBULATORY_CARE_PROVIDER_SITE_OTHER): Payer: Self-pay | Admitting: Family Medicine

## 2017-09-15 ENCOUNTER — Ambulatory Visit (INDEPENDENT_AMBULATORY_CARE_PROVIDER_SITE_OTHER): Payer: 59 | Admitting: Family Medicine

## 2017-09-15 VITALS — BP 155/96 | HR 69 | Temp 98.2°F | Ht 68.0 in | Wt 305.0 lb

## 2017-09-15 DIAGNOSIS — Z1331 Encounter for screening for depression: Secondary | ICD-10-CM | POA: Diagnosis not present

## 2017-09-15 DIAGNOSIS — Z0289 Encounter for other administrative examinations: Secondary | ICD-10-CM

## 2017-09-15 DIAGNOSIS — R5383 Other fatigue: Secondary | ICD-10-CM | POA: Diagnosis not present

## 2017-09-15 DIAGNOSIS — E119 Type 2 diabetes mellitus without complications: Secondary | ICD-10-CM | POA: Diagnosis not present

## 2017-09-15 DIAGNOSIS — E7849 Other hyperlipidemia: Secondary | ICD-10-CM | POA: Diagnosis not present

## 2017-09-15 DIAGNOSIS — Z6841 Body Mass Index (BMI) 40.0 and over, adult: Secondary | ICD-10-CM

## 2017-09-15 DIAGNOSIS — Z9189 Other specified personal risk factors, not elsewhere classified: Secondary | ICD-10-CM

## 2017-09-15 DIAGNOSIS — I10 Essential (primary) hypertension: Secondary | ICD-10-CM

## 2017-09-15 DIAGNOSIS — R0602 Shortness of breath: Secondary | ICD-10-CM

## 2017-09-15 NOTE — Progress Notes (Signed)
Office: [phone]  /  Fax: [phone]   Dear Dr. Vickey Huger,   Thank you for referring Brandon [name] to our clinic. The following note includes my evaluation and treatment recommendations.  HPI:   Chief Complaint: OBESITY    Brandon [name] has been referred by Melvyn Novas, MD for consultation regarding his obesity and obesity related comorbidities.    Brandon [name] (MR# [id number]) is a 47 y.o. male who presents on 09/15/2017 for obesity evaluation and treatment. Current BMI is Body mass index is 46.38 kg/m.Brandon [name] has been struggling with his weight for many years and has been unsuccessful in either losing weight, maintaining weight loss, or reaching his healthy weight goal. Kratos has multiple health issues, which are affecting his weight.     Brandon [name] attended our information session and states he is currently in the action stage of change and ready to dedicate time achieving and maintaining a healthier weight. Brandon [name] is interested in becoming our patient and working on intensive lifestyle modifications including (but not limited to) diet, exercise and weight loss.    Brandon [name] states his family eats meals together he thinks his family will eat healthier with  him his desired weight loss is 125 lbs his heaviest weight ever was 305 lbs. he is a picky eater and doesn't like to eat healthier foods  he has significant food cravings issues  he snacks frequently in the evenings he skips meals frequently he is frequently drinking liquids with calories he frequently makes poor food choices he frequently eats larger portions than normal  he has binge eating behaviors he struggles with emotional eating    Fatigue Brandon [name] feels his energy is lower than it should be. This has worsened with weight gain and has not worsened recently. Brandon [name] admits to daytime somnolence and denies waking up still tired. Patient has a diagnosis of obstructive sleep apnea which may contribute to his  fatigue. Brandon [name] has a history of symptoms of daytime fatigue and hypertension. Patient generally gets 6 to 9 hours of sleep per night, and states they generally have restful sleep when using CPAP. Snoring is present without CPAP. Apneic episodes are present without CPAP. Epworth Sleepiness Score is 16  Dyspnea on exertion Brandon [name] notes increasing shortness of breath with exercising and seems to be worsening over time with weight gain. He notes getting out of breath sooner with activity than he used to. This has not gotten worse recently. Brandon [name] denies orthopnea.  Diabetes II Brandon [name] has a diagnosis of diabetes type II and is currently on metformin  Brandon [name] notes feeling a lot of hypoglycemia, but he didn't bring in his blood sugar log today. He is attempting to work on intensive lifestyle modifications including diet, exercise, and weight loss to help control his blood glucose levels.  Hyperlipidemia Brandon [name] has hyperlipidemia and is currently on lipitor. Brandon [name] is attempting to improve his cholesterol levels with intensive lifestyle modification including a low saturated fat diet, exercise and weight loss. He denies any chest pain or specific myalgias but he has generalized pain issues.  Hypertension Brandon [name] is a 47 y.o. male with hypertension. He is on lisinopril/HCTZ and his blood pressure is elevated today at 155/96 and Brandon [name] denies chest pain. He is working weight loss to help control his blood pressure with the goal of decreasing his risk of heart attack and stroke. Michaels blood pressure is not currently controlled.  At risk for cardiovascular disease Tidus is at a higher than average risk  for cardiovascular disease due to obesity, diabetes, hyperlipidemia and hypertension. He currently denies any chest pain.  Depression Screen Oshua's Food and Mood (modified PHQ-9) score was  Depression screen PHQ 2/9 09/15/2017  Decreased Interest 1  Down, Depressed, Hopeless 1    PHQ - 2 Score 2  Altered sleeping 2  Tired, decreased energy 1  Change in appetite 3  Feeling bad or failure about yourself  1  Trouble concentrating 3  Moving slowly or fidgety/restless 1  Suicidal thoughts 0  PHQ-9 Score 13    ALLERGIES: Allergies  Allergen Reactions  . Penicillin G Anaphylaxis    Gets extremely high fevers.  . Sulfa Antibiotics Other (See Comments)    It will kill him  . Amoxicillin   . Cephalosporins Other (See Comments)    Reaction unspecified.  . Quinolones Other (See Comments)    Reaction unspecified  . Sulfamethoxazole Swelling    MEDICATIONS: Current Outpatient Medications on File Prior to Visit  Medication Sig Dispense Refill  . atorvastatin (LIPITOR) 20 MG tablet Take 1 tablet (20 mg total) by mouth at bedtime. (0000) 90 tablet 1  . colesevelam (WELCHOL) 625 MG tablet Take 3 tablets (1,875 mg total) by mouth daily with breakfast. 540 tablet 1  . diclofenac (VOLTAREN) 75 MG EC tablet Take 1 tablet (75 mg total) by mouth 2 (two) times daily. 180 tablet 1  . gabapentin (NEURONTIN) 300 MG capsule Take 3 capsules (900 mg total) by mouth 2 (two) times daily. 540 capsule 1  . lisinopril-hydrochlorothiazide (PRINZIDE,ZESTORETIC) 10-12.5 MG tablet Take 1 tablet by mouth at bedtime. 0000 90 tablet 1  . metFORMIN (GLUCOPHAGE-XR) 500 MG 24 hr tablet TAKE 2 TABLETS (1,000 MG TOTAL) BY MOUTH 2 (TWO) TIMES DAILY. 360 tablet 1  . NON FORMULARY CPAP machine at bedtime & sleep    . tiZANidine (ZANAFLEX) 4 MG capsule Take 1 capsule (4 mg total) by mouth 2 (two) times daily. 180 capsule 1   No current facility-administered medications on file prior to visit.     PAST MEDICAL HISTORY: Past Medical History:  Diagnosis Date  . ADEM (acute disseminated encephalomyelitis)   . Allergy   . Anxiety 2005  . Complication of anesthesia   . Depression 2005  . Difficult intubation    difficult intubating and exbutating "throat Clinched around tube"  . Headache(784.0)    . Hearing loss   . Hyperinsulinemia 07/04/11   "I'm on metformin cause I produce too much insulin"  . Hyperlipidemia   . Hypertension   . Mononucleosis, infectious, with hepatitis 06/2011  . MS (multiple sclerosis) (HCC)    patinet has ADEM not MS per Patient  . Nausea    occasional per history form 08/18/11  . Neuromuscular disorder (HCC)   . Pneumonia 2010  . Sleep apnea    uses CPAP at home  . Stroke (HCC) 2000   TIA  . TIA (transient ischemic attack) 2000   denies residual  . TIA (transient ischemic attack)     PAST SURGICAL HISTORY: Past Surgical History:  Procedure Laterality Date  . ANTERIOR CERVICAL DECOMP/DISCECTOMY FUSION N/A 10/21/2016   Procedure: ANTERIOR CERVICAL DECOMPRESSION/DISCECTOMY FUSION, INTERBODY PROSTHESIS,PLATE CERVICAL FOUR- CERVICAL FIVE, CERVICAL FIVE- CERVICAL SIX;  Surgeon: Tressie Stalker, MD;  Location: [facility] OR;  Service: Neurosurgery;  Laterality: N/A;  . CHOLECYSTECTOMY  08/02/2011   Procedure: LAPAROSCOPIC CHOLECYSTECTOMY;  Surgeon: Shelly Rubenstein, MD;  Location: [facility] OR;  Service: General;;  . COLONOSCOPY  2003  . ESOPHAGOGASTRODUODENOSCOPY  2003  .  ESOPHAGOGASTRODUODENOSCOPY  07/28/2011   Procedure: ESOPHAGOGASTRODUODENOSCOPY (EGD);  Surgeon: [name], MD;  Location[facility] ENDOSCOPY;  Service: Endoscopy;  Laterality: N/A;  . MYRINGOTOMY  1979; 1980   bilaterally  . TONSILLECTOMY AND ADENOIDECTOMY  ~ 1980    SOCIAL HISTORY: Social History   Tobacco Use  . Smoking status: Former Smoker    Packs/day: 3.00    Years: 3.00    Pack years: 9.00    Types: Cigarettes    Last attempt to quit: 08/18/1987    Years since quitting: 30.0  . Smokeless tobacco: Never Used  Substance Use Topics  . Alcohol use: Yes    Alcohol/week: 0.0 oz    Comment: 07/04/11 "glass of wine or spirits once a month"  . Drug use: Yes    Types: Marijuana    Comment: have not used in a 1.5 years    FAMILY HISTORY: Family History  Problem Relation Age of Onset  .  Coronary artery disease Father   . Stroke Father   . Cancer Father   . High blood pressure Father   . Alcoholism Father   . Diabetes Mother   . Obesity Mother   . Cancer Maternal Grandmother        bone  . Cancer Other        Stomach -Great-great grandmother    ROS: Review of Systems  Constitutional: Positive for malaise/fatigue.  HENT: Positive for hearing loss.   Eyes:       Wear Glasses or Contacts  Respiratory: Positive for shortness of breath.   Cardiovascular: Negative for chest pain and orthopnea.       Shortness of Breath on exertion Sudden Awakening from Sleep with Shortness of Breath Leg Cramping  Gastrointestinal: Positive for diarrhea and nausea.  Genitourinary: Positive for frequency.  Musculoskeletal: Positive for neck pain. Negative for myalgias.       Neck Stiffness Muscle or Joint Pain Muscle Stiffness  Neurological: Positive for tremors and weakness.  Endo/Heme/Allergies: Positive for polydipsia.       Positive for hypoglycemia  Psychiatric/Behavioral: Positive for depression.       Stress    PHYSICAL EXAM: Blood pressure (!) 155/96, pulse 69, temperature 98.2 F (36.8 C), temperature source Oral, height 5\' 8"  (1.727 m), weight (!) 305 lb (138.3 kg), SpO2 98 %. Body mass index is 46.38 kg/m. Physical Exam  Constitutional: He is oriented to person, place, and time. He appears well-developed and well-nourished.  HENT:  Head: Normocephalic and atraumatic.  Nose: Nose normal.  Eyes: EOM are normal. No scleral icterus.  Neck: Normal range of motion. Neck supple. No thyromegaly present.  Cardiovascular: Normal rate and regular rhythm.  Pulmonary/Chest: Effort normal. No respiratory distress.  Abdominal: Soft. There is no tenderness.  +obesity  Musculoskeletal: Normal range of motion.  Range of Motion normal in all 4 extremities  Neurological: He is alert and oriented to person, place, and time. Coordination normal.  Skin: Skin is warm and dry.    Psychiatric: He has a normal mood and affect. His behavior is normal.  Vitals reviewed.   RECENT LABS AND TESTS: BMET    Component Value Date/Time   NA 141 08/24/2017 1458   NA 144 02/18/2017 1023   K 3.9 08/24/2017 1458   CL 104 08/24/2017 1458   CO2 28 08/24/2017 1458   GLUCOSE 88 08/24/2017 1458   BUN 13 08/24/2017 1458   BUN 13 02/18/2017 1023   CREATININE 1.02 08/24/2017 1458   CALCIUM 9.9 08/24/2017 1458  GFRNONAA 107 02/18/2017 1023   GFRNONAA >89 07/18/2015 1656   GFRAA 124 02/18/2017 1023   GFRAA >89 07/18/2015 1656   Lab Results  Component Value Date   HGBA1C 5.6 08/24/2017   No results found for: INSULIN CBC    Component Value Date/Time   WBC 6.2 10/12/2016 1441   RBC 5.27 10/12/2016 1441   HGB 14.8 10/12/2016 1441   HGB 15.1 09/17/2016 1611   HCT 43.5 10/12/2016 1441   HCT 44.6 09/17/2016 1611   PLT 151 10/12/2016 1441   PLT 158 09/17/2016 1611   MCV 82.5 10/12/2016 1441   MCV 83 09/17/2016 1611   MCH 28.1 10/12/2016 1441   MCHC 34.0 10/12/2016 1441   RDW 13.8 10/12/2016 1441   RDW 14.9 09/17/2016 1611   LYMPHSABS 1.9 09/17/2016 1611   MONOABS 0.5 12/05/2014 1538   EOSABS 0.1 09/17/2016 1611   BASOSABS 0.0 09/17/2016 1611   Iron/TIBC/Ferritin/ %Sat No results found for: IRON, TIBC, FERRITIN, IRONPCTSAT Lipid Panel     Component Value Date/Time   CHOL 119 08/24/2017 1458   CHOL 132 02/18/2017 1023   TRIG 196 (H) 08/24/2017 1458   HDL 35 (L) 08/24/2017 1458   HDL 33 (L) 02/18/2017 1023   CHOLHDL 3.4 08/24/2017 1458   VLDL 70 (H) 10/15/2015 1112   LDLCALC 57 08/24/2017 1458   Hepatic Function Panel     Component Value Date/Time   PROT 7.0 08/24/2017 1458   PROT 7.0 02/18/2017 1023   ALBUMIN 4.7 02/18/2017 1023   AST 36 08/24/2017 1458   ALT 76 (H) 08/24/2017 1458   ALKPHOS 72 02/18/2017 1023   BILITOT 0.8 08/24/2017 1458   BILITOT 0.4 02/18/2017 1023   BILIDIR 0.1 04/11/2015 1638   IBILI 0.6 04/11/2015 1638      Component  Value Date/Time   TSH 3.190 09/17/2016 1611   TSH 2.339 12/05/2014 1538   TSH 1.452 08/09/2013 1544   Vitamin D There are no recent lab results  ECG  shows NSR with a rate of 71 BPM INDIRECT CALORIMETER done today shows a VO2 of 414 and a REE of 2883.  His calculated basal metabolic rate is 4098 thus his basal metabolic rate is better than expected.    ASSESSMENT AND PLAN: Other fatigue - Plan: EKG 12-Lead, Vitamin B12, CBC With Differential, Folate, T3, T4, free, TSH, VITAMIN D 25 Hydroxy (Vit-D Deficiency, Fractures)  Shortness of breath on exertion - Plan: CBC With Differential  Type 2 diabetes mellitus without complication, without long-term current use of insulin (HCC) - Plan: Comprehensive metabolic panel, Hemoglobin A1c, Insulin, random, Microalbumin / creatinine urine ratio  Other hyperlipidemia - Plan: Lipid Panel With LDL/HDL Ratio  Essential hypertension  Depression screening  At risk for heart disease  Class 3 severe obesity with serious comorbidity and body mass index (BMI) of 45.0 to 49.9 in adult, unspecified obesity type (HCC)  PLAN: Fatigue Isadore was informed that his fatigue may be related to obesity, depression or many other causes. Labs will be ordered, and in the meanwhile Braydan has agreed to work on diet, exercise and weight loss to help with fatigue. Proper sleep hygiene was discussed including the need for 7-8 hours of quality sleep each night. A sleep study was not ordered based on symptoms and Epworth score.  Dyspnea on exertion Eriel's shortness of breath appears to be obesity related and exercise induced. He has agreed to work on weight loss and gradually increase exercise to treat his exercise induced shortness of  breath. If Blanchard follows our instructions and loses weight without improvement of his shortness of breath, we will plan to refer to pulmonology. We will monitor this condition regularly. [place]ington agrees to this plan.  Diabetes  II Westlee has been given extensive diabetes education by myself today including ideal fasting and post-prandial blood glucose readings, individual ideal Hgb A1c goals and hypoglycemia prevention. We discussed the importance of good blood sugar control to decrease the likelihood of diabetic complications such as nephropathy, neuropathy, limb loss, blindness, coronary artery disease, and death. We discussed the importance of intensive lifestyle modification including diet, exercise and weight loss as the first line treatment for diabetes. Florian agrees to check his blood sugar 2 times daily and continue metformin as prescribed. Kazi will start the category 4 diabetic diet and follow up at the agreed upon time.  Hyperlipidemia Clayton was informed of the American Heart Association Guidelines emphasizing intensive lifestyle modifications as the first line treatment for hyperlipidemia. We discussed many lifestyle modifications today in depth, and Nilay will continue to work on decreasing saturated fats such as fatty red meat, butter and many fried foods. He will also increase vegetables and lean protein in his diet and work on exercise and weight loss efforts. We will check labs and Kavonte agrees to follow up as directed.  Hypertension We discussed sodium restriction, working on healthy weight loss, and a regular exercise program as the means to achieve improved blood pressure control. Donyel agreed with this plan and agreed to follow up as directed. This may be due to white coat syndrome. We will check labs and Dietrich will continue lisinopril/HCTZ as prescribed. We will recheck blood pressure in 3 weeks and will continue to monitor his blood pressure as well as his progress with the above lifestyle modifications. He will watch for signs of hypotension as he continues his lifestyle modifications.  Cardiovascular risk counseling Jarrid was given extended (15 minutes) coronary artery disease prevention  counseling today. He is 47 y.o. male and has risk factors for heart disease including obesity, diabetes, hyperlipidemia and hypertension. We discussed intensive lifestyle modifications today with an emphasis on specific weight loss instructions and strategies. Pt was also informed of the importance of increasing exercise and decreasing saturated fats to help prevent heart disease.  Depression Screen Grover had a moderately positive depression screening. Depression is commonly associated with obesity and often results in emotional eating behaviors. We will monitor this closely and work on CBT to help improve the non-hunger eating patterns. Referral to Psychology may be required if no improvement is seen as he continues in our clinic.  Obesity Stiven is currently in the action stage of change and his goal is to continue with weight loss efforts. I recommend Jerimie begin the structured treatment plan as follows:  He has agreed to follow the Category 4 plan Creedence has been instructed to eventually work up to a goal of 150 minutes of combined cardio and strengthening exercise per week for weight loss and overall health benefits. We discussed the following Behavioral Modification Strategies today: increasing lean protein intake, decreasing simple carbohydrates  and work on meal planning and easy cooking plans   He was informed of the importance of frequent follow up visits to maximize his success with intensive lifestyle modifications for his multiple health conditions. He was informed we would discuss his lab results at his next visit unless there is a critical issue that needs to be addressed sooner. Jerimah agreed to keep his next visit at  the agreed upon time to discuss these results.    OBESITY BEHAVIORAL INTERVENTION VISIT  Today's visit was # 1 out of 22.  Starting weight: 305 lbs Starting date: 09/15/17 Today's weight : 305 lbs  Today's date: 09/15/2017 Total lbs lost to date:  0 (Patients must lose 7 lbs in the first 6 months to continue with counseling)   ASK: We discussed the diagnosis of obesity with Dahlia Byes today and Brandon [name] agreed to give [place] permission to discuss obesity behavioral modification therapy today.  ASSESS: Selwyn has the diagnosis of obesity and his BMI today is 46.39 Deronte is in the action stage of change   ADVISE: Lum was educated on the multiple health risks of obesity as well as the benefit of weight loss to improve his health. He was advised of the need for long term treatment and the importance of lifestyle modifications.  AGREE: Multiple dietary modification options and treatment options were discussed and  Zakar agreed to the above obesity treatment plan.   I, [place] Crane, am acting as transcriptionist for  Quillian Quince, MD  I have reviewed the above documentation for accuracy and completeness, and I agree with the above. -Quillian Quince, MD

## 2017-09-16 LAB — LIPID PANEL WITH LDL/HDL RATIO
Cholesterol, Total: 111 mg/dL (ref 100–199)
HDL: 31 mg/dL — ABNORMAL LOW (ref >39)
LDL Calculated: 12 mg/dL (ref 0–99)
LDl/HDL Ratio: 0.4 ratio (ref 0.0–3.6)
Triglycerides: 339 mg/dL — ABNORMAL HIGH (ref 0–149)
VLDL Cholesterol Cal: 68 mg/dL — ABNORMAL HIGH (ref 5–40)

## 2017-09-16 LAB — COMPREHENSIVE METABOLIC PANEL
ALT: 60 IU/L — ABNORMAL HIGH (ref 0–44)
AST: 24 IU/L (ref 0–40)
Albumin/Globulin Ratio: 1.9 (ref 1.2–2.2)
Albumin: 4.4 g/dL (ref 3.5–5.5)
Alkaline Phosphatase: 58 IU/L (ref 39–117)
BUN/Creatinine Ratio: 14 (ref 9–20)
BUN: 13 mg/dL (ref 6–24)
Bilirubin Total: 0.5 mg/dL (ref 0.0–1.2)
CO2: 25 mmol/L (ref 20–29)
Calcium: 9.4 mg/dL (ref 8.7–10.2)
Chloride: 100 mmol/L (ref 96–106)
Creatinine, Ser: 0.95 mg/dL (ref 0.76–1.27)
GFR calc Af Amer: 111 mL/min/{1.73_m2} (ref >59)
GFR calc non Af Amer: 96 mL/min/{1.73_m2} (ref >59)
Globulin, Total: 2.3 g/dL (ref 1.5–4.5)
Glucose: 86 mg/dL (ref 65–99)
Potassium: 3.7 mmol/L (ref 3.5–5.2)
Sodium: 142 mmol/L (ref 134–144)
Total Protein: 6.7 g/dL (ref 6.0–8.5)

## 2017-09-16 LAB — CBC WITH DIFFERENTIAL
Basophils Absolute: 0 10*3/uL (ref 0.0–0.2)
Basos: 0 %
EOS (ABSOLUTE): 0.1 10*3/uL (ref 0.0–0.4)
Eos: 2 %
Hematocrit: 49.5 % (ref 37.5–51.0)
Hemoglobin: 16.6 g/dL (ref 13.0–17.7)
Immature Grans (Abs): 0 10*3/uL (ref 0.0–0.1)
Immature Granulocytes: 0 %
Lymphocytes Absolute: 2.2 10*3/uL (ref 0.7–3.1)
Lymphs: 35 %
MCH: 28.9 pg (ref 26.6–33.0)
MCHC: 33.5 g/dL (ref 31.5–35.7)
MCV: 86 fL (ref 79–97)
Monocytes Absolute: 0.4 10*3/uL (ref 0.1–0.9)
Monocytes: 7 %
Neutrophils Absolute: 3.5 10*3/uL (ref 1.4–7.0)
Neutrophils: 56 %
RBC: 5.74 x10E6/uL (ref 4.14–5.80)
RDW: 14.7 % (ref 12.3–15.4)
WBC: 6.3 10*3/uL (ref 3.4–10.8)

## 2017-09-16 LAB — T3: T3, Total: 127 ng/dL (ref 71–180)

## 2017-09-16 LAB — T4, FREE: Free T4: 1.12 ng/dL (ref 0.82–1.77)

## 2017-09-16 LAB — HEMOGLOBIN A1C
Est. average glucose Bld gHb Est-mCnc: 117 mg/dL
Hgb A1c MFr Bld: 5.7 % — ABNORMAL HIGH (ref 4.8–5.6)

## 2017-09-16 LAB — INSULIN, RANDOM: INSULIN: 110.6 u[IU]/mL — ABNORMAL HIGH (ref 2.6–24.9)

## 2017-09-16 LAB — MICROALBUMIN / CREATININE URINE RATIO
Creatinine, Urine: 149.8 mg/dL
Microalb/Creat Ratio: 10.9 mg/g creat (ref 0.0–30.0)
Microalbumin, Urine: 16.3 ug/mL

## 2017-09-16 LAB — VITAMIN B12: Vitamin B-12: 329 pg/mL (ref 232–1245)

## 2017-09-16 LAB — VITAMIN D 25 HYDROXY (VIT D DEFICIENCY, FRACTURES): Vit D, 25-Hydroxy: 10.9 ng/mL — ABNORMAL LOW (ref 30.0–100.0)

## 2017-09-16 LAB — FOLATE: Folate: 10 ng/mL (ref >3.0)

## 2017-09-16 LAB — TSH: TSH: 2.08 u[IU]/mL (ref 0.450–4.500)

## 2017-09-29 ENCOUNTER — Encounter (INDEPENDENT_AMBULATORY_CARE_PROVIDER_SITE_OTHER): Payer: Self-pay | Admitting: Family Medicine

## 2017-10-04 ENCOUNTER — Encounter (INDEPENDENT_AMBULATORY_CARE_PROVIDER_SITE_OTHER): Payer: Self-pay | Admitting: Family Medicine

## 2017-10-04 ENCOUNTER — Ambulatory Visit (INDEPENDENT_AMBULATORY_CARE_PROVIDER_SITE_OTHER): Payer: 59 | Admitting: Family Medicine

## 2017-10-04 VITALS — BP 135/85 | HR 65 | Temp 97.5°F | Ht 68.0 in | Wt 297.0 lb

## 2017-10-04 DIAGNOSIS — Z6841 Body Mass Index (BMI) 40.0 and over, adult: Secondary | ICD-10-CM | POA: Diagnosis not present

## 2017-10-04 DIAGNOSIS — E119 Type 2 diabetes mellitus without complications: Secondary | ICD-10-CM

## 2017-10-04 DIAGNOSIS — Z9189 Other specified personal risk factors, not elsewhere classified: Secondary | ICD-10-CM

## 2017-10-04 DIAGNOSIS — E782 Mixed hyperlipidemia: Secondary | ICD-10-CM

## 2017-10-04 DIAGNOSIS — E559 Vitamin D deficiency, unspecified: Secondary | ICD-10-CM | POA: Diagnosis not present

## 2017-10-04 DIAGNOSIS — E66813 Obesity, class 3: Secondary | ICD-10-CM

## 2017-10-04 MED ORDER — VITAMIN D (ERGOCALCIFEROL) 1.25 MG (50000 UNIT) PO CAPS: 50000.0000 [IU] | ORAL_CAPSULE | ORAL | 0 refills | Status: DC

## 2017-10-04 MED ORDER — INSULIN PEN NEEDLE 32G X 4 MM MISC: 1.0000 | Freq: Two times a day (BID) | 0 refills | Status: DC

## 2017-10-04 MED ORDER — LIRAGLUTIDE 18 MG/3ML ~~LOC~~ SOPN: 0.6000 mg | PEN_INJECTOR | SUBCUTANEOUS | 0 refills | Status: DC

## 2017-10-04 NOTE — Progress Notes (Signed)
Office: [phone]  /  Fax: [phone]   HPI:   Chief Complaint: OBESITY Brandon [name] is here to discuss his progress with his obesity treatment plan. He is on the Category 4 plan and is following his eating plan approximately 60-70 % of the time. He states he is exercising 0 minutes 0 times per week. Brandon [name] did very well with weight loss but deviated from his plan significantly, no adequate protein and increased simple carbohydrates. He did try to decrease regular soda and juice.  His weight is 297 lb (134.7 kg) today and has had a weight loss of 8 pounds over a period of 3 weeks since his last visit. He has lost 8 lbs since starting treatment with [place].  Hyperlipidemia (Mixed) Brandon [name] has hyperlipidemia and would like to improve his cholesterol levels with intensive lifestyle modification including a low saturated fat diet, exercise and weight loss. He is on Lipitor, HDL decreased and triglycerides increased. He denies any chest pain, claudication or myalgias.  Diabetes II Brandon [name] has a diagnosis of diabetes type II. Brandon [name]'s previous A1c at 6.5, now decreased to 5.8 but fasting insulin is impressively elevated at 110. He has a very strong family history of diabetes mellitus. He notes acanthosis nigricans and polyphagia, and he denies polyuria, polydipsia, or hypoglycemia. He has been working on intensive lifestyle modifications including diet, exercise, and weight loss to help control his blood glucose levels.  At risk for cardiovascular disease Brandon [name] is at a higher than average risk for cardiovascular disease due to obesity, hyperlipidemia, and diabetes II. He currently denies any chest pain.  Vitamin D Deficiency Brandon [name] has a diagnosis of vitamin D deficiency. He is not on Vit D, he notes serious fatigue and denies nausea, vomiting or muscle weakness.  ALLERGIES: Allergies  Allergen Reactions  . Penicillin G Anaphylaxis    Gets extremely high fevers.  . Sulfa Antibiotics Other  (See Comments)    It will kill him  . Amoxicillin   . Cephalosporins Other (See Comments)    Reaction unspecified.  . Quinolones Other (See Comments)    Reaction unspecified  . Sulfamethoxazole Swelling    MEDICATIONS: Current Outpatient Medications on File Prior to Visit  Medication Sig Dispense Refill  . atorvastatin (LIPITOR) 20 MG tablet Take 1 tablet (20 mg total) by mouth at bedtime. (0000) 90 tablet 1  . colesevelam (WELCHOL) 625 MG tablet Take 3 tablets (1,875 mg total) by mouth daily with breakfast. 540 tablet 1  . diclofenac (VOLTAREN) 75 MG EC tablet Take 1 tablet (75 mg total) by mouth 2 (two) times daily. 180 tablet 1  . gabapentin (NEURONTIN) 300 MG capsule Take 3 capsules (900 mg total) by mouth 2 (two) times daily. 540 capsule 1  . lisinopril-hydrochlorothiazide (PRINZIDE,ZESTORETIC) 10-12.5 MG tablet Take 1 tablet by mouth at bedtime. 0000 90 tablet 1  . metFORMIN (GLUCOPHAGE-XR) 500 MG 24 hr tablet TAKE 2 TABLETS (1,000 MG TOTAL) BY MOUTH 2 (TWO) TIMES DAILY. 360 tablet 1  . NON FORMULARY CPAP machine at bedtime & sleep    . tiZANidine (ZANAFLEX) 4 MG capsule Take 1 capsule (4 mg total) by mouth 2 (two) times daily. 180 capsule 1   No current facility-administered medications on file prior to visit.     PAST MEDICAL HISTORY: Past Medical History:  Diagnosis Date  . ADEM (acute disseminated encephalomyelitis)   . Allergy   . Anxiety 2005  . Complication of anesthesia   . Depression 2005  . Difficult intubation  difficult intubating and exbutating "throat Clinched around tube"  . Headache(784.0)   . Hearing loss   . Hyperinsulinemia 07/04/11   "I'm on metformin cause I produce too much insulin"  . Hyperlipidemia   . Hypertension   . Mononucleosis, infectious, with hepatitis 06/2011  . MS (multiple sclerosis) (HCC)    patinet has ADEM not MS per Patient  . Nausea    occasional per history form 08/18/11  . Neuromuscular disorder (HCC)   . Pneumonia 2010    . Sleep apnea    uses CPAP at home  . Stroke (HCC) 2000   TIA  . TIA (transient ischemic attack) 2000   denies residual  . TIA (transient ischemic attack)     PAST SURGICAL HISTORY: Past Surgical History:  Procedure Laterality Date  . ANTERIOR CERVICAL DECOMP/DISCECTOMY FUSION N/A 10/21/2016   Procedure: ANTERIOR CERVICAL DECOMPRESSION/DISCECTOMY FUSION, INTERBODY PROSTHESIS,PLATE CERVICAL FOUR- CERVICAL FIVE, CERVICAL FIVE- CERVICAL SIX;  Surgeon: Tressie Stalker, MD;  Location: [facility] OR;  Service: Neurosurgery;  Laterality: N/A;  . CHOLECYSTECTOMY  08/02/2011   Procedure: LAPAROSCOPIC CHOLECYSTECTOMY;  Surgeon: Shelly Rubenstein, MD;  Location: [facility] OR;  Service: General;;  . COLONOSCOPY  2003  . ESOPHAGOGASTRODUODENOSCOPY  2003  . ESOPHAGOGASTRODUODENOSCOPY  07/28/2011   Procedure: ESOPHAGOGASTRODUODENOSCOPY (EGD);  Surgeon: [name], MD;  Location: [facility] ENDOSCOPY;  Service: Endoscopy;  Laterality: N/A;  . MYRINGOTOMY  1979; 1980   bilaterally  . TONSILLECTOMY AND ADENOIDECTOMY  ~ 1980    SOCIAL HISTORY: Social History   Tobacco Use  . Smoking status: Former Smoker    Packs/day: 3.00    Years: 3.00    Pack years: 9.00    Types: Cigarettes    Last attempt to quit: 08/18/1987    Years since quitting: 30.1  . Smokeless tobacco: Never Used  Substance Use Topics  . Alcohol use: Yes    Alcohol/week: 0.0 oz    Comment: 07/04/11 "glass of wine or spirits once a month"  . Drug use: Yes    Types: Marijuana    Comment: have not used in a 1.5 years    FAMILY HISTORY: Family History  Problem Relation Age of Onset  . Coronary artery disease Father   . Stroke Father   . Cancer Father   . High blood pressure Father   . Alcoholism Father   . Diabetes Mother   . Obesity Mother   . Cancer Maternal Grandmother        bone  . Cancer Other        Stomach -Great-great grandmother    ROS: Review of Systems  Constitutional: Positive for malaise/fatigue and weight loss.   Cardiovascular: Negative for chest pain and claudication.  Gastrointestinal: Negative for nausea and vomiting.  Genitourinary: Negative for frequency.  Musculoskeletal: Negative for myalgias.       Negative muscle weakness  Skin:       + Acanthosis nigricans  Endo/Heme/Allergies: Negative for polydipsia.       Negative hypoglycemia Positive polyphagia    PHYSICAL EXAM: Blood pressure 135/85, pulse 65, temperature (!) 97.5 F (36.4 C), temperature source Oral, height 5\' 8"  (1.727 m), weight 297 lb (134.7 kg), SpO2 97 %. Body mass index is 45.16 kg/m. Physical Exam  Constitutional: He is oriented to person, place, and time. He appears well-developed and well-nourished.  Cardiovascular: Normal rate.  Pulmonary/Chest: Effort normal.  Musculoskeletal: Normal range of motion.  Neurological: He is oriented to person, place, and time.  Skin: Skin is warm and dry.  Psychiatric: He has a normal mood and affect. His behavior is normal.  Vitals reviewed.   RECENT LABS AND TESTS: BMET    Component Value Date/Time   NA 142 09/15/2017 0902   K 3.7 09/15/2017 0902   CL 100 09/15/2017 0902   CO2 25 09/15/2017 0902   GLUCOSE 86 09/15/2017 0902   GLUCOSE 88 08/24/2017 1458   BUN 13 09/15/2017 0902   CREATININE 0.95 09/15/2017 0902   CREATININE 1.02 08/24/2017 1458   CALCIUM 9.4 09/15/2017 0902   GFRNONAA 96 09/15/2017 0902   GFRNONAA >89 07/18/2015 1656   GFRAA 111 09/15/2017 0902   GFRAA >89 07/18/2015 1656   Lab Results  Component Value Date   HGBA1C 5.7 (H) 09/15/2017   HGBA1C 5.6 08/24/2017   HGBA1C 6.5 (H) 02/18/2017   HGBA1C 5.5 09/17/2016   HGBA1C 6.3 (H) 10/15/2015   Lab Results  Component Value Date   INSULIN 110.6 (H) 09/15/2017   CBC    Component Value Date/Time   WBC 6.3 09/15/2017 0902   WBC 6.2 10/12/2016 1441   RBC 5.74 09/15/2017 0902   RBC 5.27 10/12/2016 1441   HGB 16.6 09/15/2017 0902   HCT 49.5 09/15/2017 0902   PLT 151 10/12/2016 1441   PLT 158  09/17/2016 1611   MCV 86 09/15/2017 0902   MCH 28.9 09/15/2017 0902   MCH 28.1 10/12/2016 1441   MCHC 33.5 09/15/2017 0902   MCHC 34.0 10/12/2016 1441   RDW 14.7 09/15/2017 0902   LYMPHSABS 2.2 09/15/2017 0902   MONOABS 0.5 12/05/2014 1538   EOSABS 0.1 09/15/2017 0902   BASOSABS 0.0 09/15/2017 0902   Iron/TIBC/Ferritin/ %Sat No results found for: IRON, TIBC, FERRITIN, IRONPCTSAT Lipid Panel     Component Value Date/Time   CHOL 111 09/15/2017 0902   TRIG 339 (H) 09/15/2017 0902   HDL 31 (L) 09/15/2017 0902   CHOLHDL 3.4 08/24/2017 1458   VLDL 70 (H) 10/15/2015 1112   LDLCALC 12 09/15/2017 0902   LDLCALC 57 08/24/2017 1458   Hepatic Function Panel     Component Value Date/Time   PROT 6.7 09/15/2017 0902   ALBUMIN 4.4 09/15/2017 0902   AST 24 09/15/2017 0902   ALT 60 (H) 09/15/2017 0902   ALKPHOS 58 09/15/2017 0902   BILITOT 0.5 09/15/2017 0902   BILIDIR 0.1 04/11/2015 1638   IBILI 0.6 04/11/2015 1638      Component Value Date/Time   TSH 2.080 09/15/2017 0902   TSH 3.190 09/17/2016 1611   TSH 2.339 12/05/2014 1538  Results for MARCY, BOGOSIAN "[name]" (MRN [id number]) as of 10/04/2017 17:08  Ref. Range 09/15/2017 09:02  Vitamin D, 25-Hydroxy Latest Ref Range: 30.0 - 100.0 ng/mL 10.9 (L)    ASSESSMENT AND PLAN: Vitamin D deficiency - Plan: Vitamin D, Ergocalciferol, (DRISDOL) 50000 units CAPS capsule  Type 2 diabetes mellitus without complication, without long-term current use of insulin (HCC) - Plan: Insulin Pen Needle (BD PEN NEEDLE NANO U/F) 32G X 4 MM MISC, liraglutide (VICTOZA) 18 MG/3ML SOPN  Mixed hyperlipidemia  At risk for heart disease  Class 3 severe obesity with serious comorbidity and body mass index (BMI) of 45.0 to 49.9 in adult, unspecified obesity type (HCC)  PLAN:  Hyperlipidemia (Mixed) Brandon [name] was informed of the American Heart Association Guidelines emphasizing intensive lifestyle modifications as the first line treatment for  hyperlipidemia. We discussed many lifestyle modifications today in depth, and Brandon [name] will continue to work on decreasing saturated fats such as fatty red meat, butter and  many fried foods. He will also increase vegetables and lean protein in his diet and continue to work on diet, exercise and weight loss efforts. Brandon [name] agrees to continue taking Lipitor, we will recheck labs in 3 months and he agrees to follow up with our clinic in 2 weeks.  Diabetes II Brandon [name] has been given extensive diabetes education by myself today including ideal fasting and post-prandial blood glucose readings, individual ideal Hgb A1c goals and hypoglycemia prevention. We discussed the importance of good blood sugar control to decrease the likelihood of diabetic complications such as nephropathy, neuropathy, limb loss, blindness, coronary artery disease, and death. We discussed the importance of intensive lifestyle modification including diet, exercise and weight loss as the first line treatment for diabetes. Brandon [name] agrees to start Victoza 0.6 mg q daily #1 pen with no refills and nano needles. Brandon [name] agrees to follow up with our clinic in 2 weeks.  Cardiovascular risk counselling Brandon [name] was given extended (30 minutes) coronary artery disease prevention counseling today. He is 47 y.o. male and has risk factors for heart disease including obesity, hyperlipidemia, and diabetes II. We discussed intensive lifestyle modifications today with an emphasis on specific weight loss instructions and strategies. Pt was also informed of the importance of increasing exercise and decreasing saturated fats to help prevent heart disease.  Vitamin D Deficiency Brandon [name] was informed that low vitamin D levels contributes to fatigue and are associated with obesity, breast, and colon cancer. Brandon [name] agrees to start prescription Vit D @50 ,000 IU every week #4 with no refills. He will follow up for routine testing of vitamin D, at least 2-3 times per  year. He was informed of the risk of over-replacement of vitamin D and agrees to not increase his dose unless he discusses this with [place] first. We will recheck labs in 3 months and Brandon [name] agrees to follow up with our clinic in 2 weeks.  Obesity Brandon [name] is currently in the action stage of change. As such, his goal is to continue with weight loss efforts He has agreed to keep a food journal with 2100-2300 calories and 100+ grams of protein daily Brandon [name] has been instructed to work up to a goal of 150 minutes of combined cardio and strengthening exercise per week for weight loss and overall health benefits. We discussed the following Behavioral Modification Strategies today: increasing lean protein intake, decreasing simple carbohydrates, increasing vegetables and work on meal planning and easy cooking plans   Brandon [name] has agreed to follow up with our clinic in 2 weeks. He was informed of the importance of frequent follow up visits to maximize his success with intensive lifestyle modifications for his multiple health conditions.   OBESITY BEHAVIORAL INTERVENTION VISIT  Today's visit was # 2 out of 22.  Starting weight: 305 lbs Starting date: 09/15/17 Today's weight : 297 lbs  Today's date: 10/04/2017 Total lbs lost to date: 8 (Patients must lose 7 lbs in the first 6 months to continue with counseling)   ASK: We discussed the diagnosis of obesity with Brandon [name] Byes today and Brandon [name] Needle agreed to give [place] permission to discuss obesity behavioral modification therapy today.  ASSESS: Brandon [name] has the diagnosis of obesity and his BMI today is 45.17 Brandon [name] is in the action stage of change   ADVISE: Lang was educated on the multiple health risks of obesity as well as the benefit of weight loss to improve his health. He was advised of the need for long term treatment and the importance of lifestyle modifications.  AGREE: Multiple dietary modification options and treatment options were discussed  and  Brandon [name] agreed to the above obesity treatment plan.  I, Burt Knack, am acting as transcriptionist for Quillian Quince, MD  I have reviewed the above documentation for accuracy and completeness, and I agree with the above. -Quillian Quince, MD

## 2017-10-13 ENCOUNTER — Encounter: Payer: Self-pay | Admitting: Physician Assistant

## 2017-10-16 ENCOUNTER — Encounter (INDEPENDENT_AMBULATORY_CARE_PROVIDER_SITE_OTHER): Payer: Self-pay | Admitting: Family Medicine

## 2017-10-20 ENCOUNTER — Ambulatory Visit (INDEPENDENT_AMBULATORY_CARE_PROVIDER_SITE_OTHER): Payer: 59 | Admitting: Family Medicine

## 2017-10-20 VITALS — BP 115/71 | HR 71 | Temp 97.9°F | Ht 68.0 in | Wt 295.0 lb

## 2017-10-20 DIAGNOSIS — E559 Vitamin D deficiency, unspecified: Secondary | ICD-10-CM

## 2017-10-20 DIAGNOSIS — Z6841 Body Mass Index (BMI) 40.0 and over, adult: Secondary | ICD-10-CM

## 2017-10-20 DIAGNOSIS — Z9189 Other specified personal risk factors, not elsewhere classified: Secondary | ICD-10-CM | POA: Diagnosis not present

## 2017-10-20 MED ORDER — VITAMIN D (ERGOCALCIFEROL) 1.25 MG (50000 UNIT) PO CAPS: 50000.0000 [IU] | ORAL_CAPSULE | ORAL | 0 refills | Status: DC

## 2017-10-21 NOTE — Progress Notes (Signed)
Office: [phone]  /  Fax: [phone]   HPI:   Chief Complaint: OBESITY Brandon [name] is here to discuss his progress with his obesity treatment plan. He is on the keep a food journal with 2100-2300 calories and 100+ grams of protein daily and is following his eating plan approximately 60 % of the time. He states he is exercising 0 minutes 0 times per week. Brandon [name] family has been sick and hospitalized and he has been unable to plan ahead as well. He notes increased stress eating in last 2 weeks but has tried to portion control and make smarter food choices.  His weight is 295 lb (133.8 kg) today and has had a weight loss of 2 pounds over a period of 2 to 3 weeks since his last visit. He has lost 10 lbs since starting treatment with [place].  Vitamin D Deficiency Brandon [name] has a diagnosis of vitamin D deficiency. He is now on prescriprion Vit D and feels his fatigue is starting to improve. He denies nausea, vomiting or muscle weakness.  At risk for osteopenia and osteoporosis Brandon [name] is at higher risk of osteopenia and osteoporosis due to vitamin D deficiency.   ALLERGIES: Allergies  Allergen Reactions  . Penicillin G Anaphylaxis    Gets extremely high fevers.  . Sulfa Antibiotics Other (See Comments)    It will kill him  . Amoxicillin   . Cephalosporins Other (See Comments)    Reaction unspecified.  . Quinolones Other (See Comments)    Reaction unspecified  . Sulfamethoxazole Swelling    MEDICATIONS: Current Outpatient Medications on File Prior to Visit  Medication Sig Dispense Refill  . atorvastatin (LIPITOR) 20 MG tablet Take 1 tablet (20 mg total) by mouth at bedtime. (0000) 90 tablet 1  . colesevelam (WELCHOL) 625 MG tablet Take 3 tablets (1,875 mg total) by mouth daily with breakfast. 540 tablet 1  . diclofenac (VOLTAREN) 75 MG EC tablet Take 1 tablet (75 mg total) by mouth 2 (two) times daily. 180 tablet 1  . gabapentin (NEURONTIN) 300 MG capsule Take 3 capsules (900 mg total)  by mouth 2 (two) times daily. 540 capsule 1  . Insulin Pen Needle (BD PEN NEEDLE NANO U/F) 32G X 4 MM MISC 1 Package by Does not apply route 2 (two) times daily. 100 each 0  . liraglutide (VICTOZA) 18 MG/3ML SOPN Inject 0.1 mLs (0.6 mg total) into the skin every morning. 1 pen 0  . lisinopril-hydrochlorothiazide (PRINZIDE,ZESTORETIC) 10-12.5 MG tablet Take 1 tablet by mouth at bedtime. 0000 90 tablet 1  . metFORMIN (GLUCOPHAGE-XR) 500 MG 24 hr tablet TAKE 2 TABLETS (1,000 MG TOTAL) BY MOUTH 2 (TWO) TIMES DAILY. 360 tablet 1  . NON FORMULARY CPAP machine at bedtime & sleep    . tiZANidine (ZANAFLEX) 4 MG capsule Take 1 capsule (4 mg total) by mouth 2 (two) times daily. 180 capsule 1   No current facility-administered medications on file prior to visit.     PAST MEDICAL HISTORY: Past Medical History:  Diagnosis Date  . ADEM (acute disseminated encephalomyelitis)   . Allergy   . Anxiety 2005  . Complication of anesthesia   . Depression 2005  . Difficult intubation    difficult intubating and exbutating "throat Clinched around tube"  . Headache(784.0)   . Hearing loss   . Hyperinsulinemia 07/04/11   "I'm on metformin cause I produce too much insulin"  . Hyperlipidemia   . Hypertension   . Mononucleosis, infectious, with hepatitis 06/2011  . MS (  multiple sclerosis) (HCC)    patinet has ADEM not MS per Patient  . Nausea    occasional per history form 08/18/11  . Neuromuscular disorder (HCC)   . Pneumonia 2010  . Sleep apnea    uses CPAP at home  . Stroke (HCC) 2000   TIA  . TIA (transient ischemic attack) 2000   denies residual  . TIA (transient ischemic attack)     PAST SURGICAL HISTORY: Past Surgical History:  Procedure Laterality Date  . ANTERIOR CERVICAL DECOMP/DISCECTOMY FUSION N/A 10/21/2016   Procedure: ANTERIOR CERVICAL DECOMPRESSION/DISCECTOMY FUSION, INTERBODY PROSTHESIS,PLATE CERVICAL FOUR- CERVICAL FIVE, CERVICAL FIVE- CERVICAL SIX;  Surgeon: Tressie Stalker, MD;   Location: [facility] OR;  Service: Neurosurgery;  Laterality: N/A;  . CHOLECYSTECTOMY  08/02/2011   Procedure: LAPAROSCOPIC CHOLECYSTECTOMY;  Surgeon: Shelly Rubenstein, MD;  Location: [facility] OR;  Service: General;;  . COLONOSCOPY  2003  . ESOPHAGOGASTRODUODENOSCOPY  2003  . ESOPHAGOGASTRODUODENOSCOPY  07/28/2011   Procedure: ESOPHAGOGASTRODUODENOSCOPY (EGD);  Surgeon: [name], MD;  Location: [facility] ENDOSCOPY;  Service: Endoscopy;  Laterality: N/A;  . MYRINGOTOMY  1979; 1980   bilaterally  . TONSILLECTOMY AND ADENOIDECTOMY  ~ 1980    SOCIAL HISTORY: Social History   Tobacco Use  . Smoking status: Former Smoker    Packs/day: 3.00    Years: 3.00    Pack years: 9.00    Types: Cigarettes    Last attempt to quit: 08/18/1987    Years since quitting: 30.1  . Smokeless tobacco: Never Used  Substance Use Topics  . Alcohol use: Yes    Alcohol/week: 0.0 oz    Comment: 07/04/11 "glass of wine or spirits once a month"  . Drug use: Yes    Types: Marijuana    Comment: have not used in a 1.5 years    FAMILY HISTORY: Family History  Problem Relation Age of Onset  . Coronary artery disease Father   . Stroke Father   . Cancer Father   . High blood pressure Father   . Alcoholism Father   . Diabetes Mother   . Obesity Mother   . Cancer Maternal Grandmother        bone  . Cancer Other        Stomach -Great-great grandmother    ROS: Review of Systems  Constitutional: Positive for malaise/fatigue and weight loss.  Gastrointestinal: Negative for nausea and vomiting.  Musculoskeletal:       Negative muscle weakness    PHYSICAL EXAM: Blood pressure 115/71, pulse 71, temperature 97.9 F (36.6 C), temperature source Oral, height 5\' 8"  (1.727 m), weight 295 lb (133.8 kg), SpO2 95 %. Body mass index is 44.85 kg/m. Physical Exam  Constitutional: He is oriented to person, place, and time. He appears well-developed and well-nourished.  Cardiovascular: Normal rate.  Pulmonary/Chest: Effort normal.    Musculoskeletal: Normal range of motion.  Neurological: He is oriented to person, place, and time.  Skin: Skin is warm and dry.  Psychiatric: He has a normal mood and affect. His behavior is normal.  Vitals reviewed.   RECENT LABS AND TESTS: BMET    Component Value Date/Time   NA 142 09/15/2017 0902   K 3.7 09/15/2017 0902   CL 100 09/15/2017 0902   CO2 25 09/15/2017 0902   GLUCOSE 86 09/15/2017 0902   GLUCOSE 88 08/24/2017 1458   BUN 13 09/15/2017 0902   CREATININE 0.95 09/15/2017 0902   CREATININE 1.02 08/24/2017 1458   CALCIUM 9.4 09/15/2017 0902   GFRNONAA 96  09/15/2017 0902   GFRNONAA >89 07/18/2015 1656   GFRAA 111 09/15/2017 0902   GFRAA >89 07/18/2015 1656   Lab Results  Component Value Date   HGBA1C 5.7 (H) 09/15/2017   HGBA1C 5.6 08/24/2017   HGBA1C 6.5 (H) 02/18/2017   HGBA1C 5.5 09/17/2016   HGBA1C 6.3 (H) 10/15/2015   Lab Results  Component Value Date   INSULIN 110.6 (H) 09/15/2017   CBC    Component Value Date/Time   WBC 6.3 09/15/2017 0902   WBC 6.2 10/12/2016 1441   RBC 5.74 09/15/2017 0902   RBC 5.27 10/12/2016 1441   HGB 16.6 09/15/2017 0902   HCT 49.5 09/15/2017 0902   PLT 151 10/12/2016 1441   PLT 158 09/17/2016 1611   MCV 86 09/15/2017 0902   MCH 28.9 09/15/2017 0902   MCH 28.1 10/12/2016 1441   MCHC 33.5 09/15/2017 0902   MCHC 34.0 10/12/2016 1441   RDW 14.7 09/15/2017 0902   LYMPHSABS 2.2 09/15/2017 0902   MONOABS 0.5 12/05/2014 1538   EOSABS 0.1 09/15/2017 0902   BASOSABS 0.0 09/15/2017 0902   Iron/TIBC/Ferritin/ %Sat No results found for: IRON, TIBC, FERRITIN, IRONPCTSAT Lipid Panel     Component Value Date/Time   CHOL 111 09/15/2017 0902   TRIG 339 (H) 09/15/2017 0902   HDL 31 (L) 09/15/2017 0902   CHOLHDL 3.4 08/24/2017 1458   VLDL 70 (H) 10/15/2015 1112   LDLCALC 12 09/15/2017 0902   LDLCALC 57 08/24/2017 1458   Hepatic Function Panel     Component Value Date/Time   PROT 6.7 09/15/2017 0902   ALBUMIN 4.4  09/15/2017 0902   AST 24 09/15/2017 0902   ALT 60 (H) 09/15/2017 0902   ALKPHOS 58 09/15/2017 0902   BILITOT 0.5 09/15/2017 0902   BILIDIR 0.1 04/11/2015 1638   IBILI 0.6 04/11/2015 1638      Component Value Date/Time   TSH 2.080 09/15/2017 0902   TSH 3.190 09/17/2016 1611   TSH 2.339 12/05/2014 1538  Results for DERRIEN, ANSCHUTZ "[name]" (MRN [id number]) as of 10/21/2017 08:08  Ref. Range 09/15/2017 09:02  Vitamin D, 25-Hydroxy Latest Ref Range: 30.0 - 100.0 ng/mL 10.9 (L)    ASSESSMENT AND PLAN: Vitamin D deficiency - Plan: Vitamin D, Ergocalciferol, (DRISDOL) 50000 units CAPS capsule  At risk for osteoporosis  Class 3 severe obesity with serious comorbidity and body mass index (BMI) of 40.0 to 44.9 in adult, unspecified obesity type (HCC)  PLAN:  Vitamin D Deficiency Oley was informed that low vitamin D levels contributes to fatigue and are associated with obesity, breast, and colon cancer. Dwon agrees to continue taking prescription Vit D @50 ,000 IU every week #4 and we will refill for 1 month. He will follow up for routine testing of vitamin D, at least 2-3 times per year. He was informed of the risk of over-replacement of vitamin D and agrees to not increase his dose unless he discusses this with [place] first. We will recheck labs in 2 months and Ngoc agrees to follow up with our clinic in 2 to 3 weeks.  At risk for osteopenia and osteoporosis Fuller is at risk for osteopenia and osteoporsis due to his vitamin D deficiency. He was encouraged to take his vitamin D and follow his higher calcium diet and increase strengthening exercise to help strengthen his bones and decrease his risk of osteopenia and osteoporosis.  Obesity Nitesh is currently in the action stage of change. As such, his goal is to continue with weight loss  efforts He has agreed to keep a food journal with 2300 calories and 100+ grams of protein daily Broadus has been instructed to work up to a goal of  150 minutes of combined cardio and strengthening exercise per week for weight loss and overall health benefits. We discussed the following Behavioral Modification Strategies today: increasing lean protein intake, decreasing simple carbohydrates  and decrease eating out   Toryn has agreed to follow up with our clinic in 2 to 3 weeks. He was informed of the importance of frequent follow up visits to maximize his success with intensive lifestyle modifications for his multiple health conditions.   OBESITY BEHAVIORAL INTERVENTION VISIT  Today's visit was # 3 out of 22.  Starting weight: 305 lbs Starting date: 09/15/17 Today's weight : 295 lbs Today's date: 10/20/2017 Total lbs lost to date: 10 (Patients must lose 7 lbs in the first 6 months to continue with counseling)   ASK: We discussed the diagnosis of obesity with Dahlia Byes today and [name] agreed to give [place] permission to discuss obesity behavioral modification therapy today.  ASSESS: Trustin has the diagnosis of obesity and his BMI today is 44.86 Michaeljames is in the action stage of change   ADVISE: Prynce was educated on the multiple health risks of obesity as well as the benefit of weight loss to improve his health. He was advised of the need for long term treatment and the importance of lifestyle modifications.  AGREE: Multiple dietary modification options and treatment options were discussed and  Malyk agreed to the above obesity treatment plan.  I, Burt Knack, am acting as transcriptionist for Quillian Quince, MD  I have reviewed the above documentation for accuracy and completeness, and I agree with the above. -Quillian Quince, MD

## 2017-10-27 ENCOUNTER — Ambulatory Visit (INDEPENDENT_AMBULATORY_CARE_PROVIDER_SITE_OTHER): Payer: 59

## 2017-10-27 ENCOUNTER — Encounter: Payer: Self-pay | Admitting: Physician Assistant

## 2017-10-27 ENCOUNTER — Other Ambulatory Visit: Payer: Self-pay

## 2017-10-27 ENCOUNTER — Ambulatory Visit (INDEPENDENT_AMBULATORY_CARE_PROVIDER_SITE_OTHER): Payer: 59 | Admitting: Physician Assistant

## 2017-10-27 VITALS — BP 128/84 | HR 87 | Temp 98.4°F | Resp 18 | Ht 68.0 in | Wt 296.2 lb

## 2017-10-27 DIAGNOSIS — R05 Cough: Secondary | ICD-10-CM

## 2017-10-27 DIAGNOSIS — J069 Acute upper respiratory infection, unspecified: Secondary | ICD-10-CM

## 2017-10-27 DIAGNOSIS — R059 Cough, unspecified: Secondary | ICD-10-CM

## 2017-10-27 MED ORDER — GUAIFENESIN ER 1200 MG PO TB12: 1.0000 | ORAL_TABLET | Freq: Two times a day (BID) | ORAL | 1 refills | Status: DC | PRN

## 2017-10-27 MED ORDER — AZELASTINE HCL 0.1 % NA SOLN: 2.0000 | Freq: Two times a day (BID) | NASAL | 0 refills | Status: DC

## 2017-10-27 MED ORDER — BENZONATATE 100 MG PO CAPS: 100.0000 mg | ORAL_CAPSULE | Freq: Three times a day (TID) | ORAL | 0 refills | Status: DC | PRN

## 2017-10-27 MED ORDER — DOXYCYCLINE HYCLATE 100 MG PO CAPS: 100.0000 mg | ORAL_CAPSULE | Freq: Two times a day (BID) | ORAL | 0 refills | Status: AC

## 2017-10-27 NOTE — Progress Notes (Signed)
Patient ID: Brandon [name], male    DOB: 08/09/1970, 47 y.o.   MRN: [id number]  PCP: Morrell Riddle, PA-C  Chief Complaint  Patient presents with  . Chest Congestion    x4 days, pt states he able to cough dark green and yellow mucus. Pt states he has chest pressure on and off and is experiencing fatigue.    Subjective:   Presents for evaluation of respiratory illness.  Describes cough, productive of dark green sputum, chest congestion, nasal congestion x 4 days.  Some chills, fatigue, SOB "runs hot all the time," so isn't sure if he's had fever. Diarrhea x 2 days. No melena, hematochezia, mucous.  One of his housemates was recently diagnosed with pneumonia, and he is worried he may have the same.  Used some leftover nasal spray, though he doesn't recall what it is, Nyquil and Dayquil with some relief.   Review of Systems Constitutional: Positive for appetite change, chills and fatigue. Negative for activity change, diaphoresis and fever.  HENT: Positive for postnasal drip, rhinorrhea, sinus pressure, sneezing, sore throat and voice change. Negative for ear discharge, ear pain, sinus pain and trouble swallowing.   Eyes: Negative.   Respiratory: Positive for cough and shortness of breath ("comes and goes"). Negative for chest tightness and wheezing.   Cardiovascular: Negative for chest pain and palpitations.  Gastrointestinal: Positive for diarrhea and nausea. Negative for abdominal pain, anal bleeding, constipation and vomiting.  Endocrine: Negative.   Genitourinary: Negative for difficulty urinating, dysuria, enuresis, flank pain, frequency, hematuria and urgency.  Musculoskeletal: Positive for arthralgias (chronic). Negative for back pain.  Skin: Negative.   Allergic/Immunologic: Negative for environmental allergies.  Neurological: Positive for weakness. Negative for dizziness, light-headedness and headaches.  Psychiatric/Behavioral: Negative.        Patient Active  Problem List   Diagnosis Date Noted  . Other fatigue 09/15/2017  . Shortness of breath on exertion 09/15/2017  . Type 2 diabetes mellitus without complication, without long-term current use of insulin (HCC) 09/15/2017  . Other hyperlipidemia 09/15/2017  . Essential hypertension 09/15/2017  . DDD (degenerative disc disease), cervical 05/03/2017  . Cervical herniated disc 10/21/2016  . Fatty liver 09/22/2016  . Spinal stenosis in cervical region 08/05/2016  . Diabetes type 2, controlled (HCC) 10/15/2015  . Hypertriglyceridemia 10/15/2015  . Benign essential HTN 12/05/2014  . Macroglossia 09/05/2014  . GAD (generalized anxiety disorder) 12/11/2013  . Motor tic disorder 12/11/2013  . OSA on CPAP 10/04/2013  . Hypersomnia with sleep apnea, unspecified 10/04/2013  . High frequency hearing loss 08/24/2013  . Peripheral neuropathy 04/19/2013  . Abnormal gait 10/19/2012  . Cervical myelopathy (HCC) 10/19/2012  . Esophageal reflux 07/27/2011  . Morbid obesity due to excess calories (HCC) 07/22/2011  . ADD (attention deficit disorder) 07/22/2011  . ADEM (acute disseminated encephalomyelitis) 07/03/2011  . Splenomegaly 07/03/2011  . Elevated LFTs 07/03/2011     Prior to Admission medications   Medication Sig Start Date End Date Taking? Authorizing Provider  atorvastatin (LIPITOR) 20 MG tablet Take 1 tablet (20 mg total) by mouth at bedtime. (0000) 08/24/17  Yes Weber, Dema Severin, PA-C  colesevelam [facility]) 625 MG tablet Take 3 tablets (1,875 mg total) by mouth daily with breakfast. 08/24/17  Yes Weber, Dema Severin, PA-C  diclofenac (VOLTAREN) 75 MG EC tablet Take 1 tablet (75 mg total) by mouth 2 (two) times daily. 08/24/17  Yes Weber, [name] L, PA-C  gabapentin (NEURONTIN) 300 MG capsule Take 3 capsules (900 mg total)  by mouth 2 (two) times daily. 08/24/17  Yes Weber, [name] L, PA-C  Insulin Pen Needle (BD PEN NEEDLE NANO U/F) 32G X 4 MM MISC 1 Package by Does not apply route 2 (two) times daily.  10/04/17  Yes Beasley, Caren D, MD  liraglutide (VICTOZA) 18 MG/3ML SOPN Inject 0.1 mLs (0.6 mg total) into the skin every morning. 10/04/17  Yes Beasley, Caren D, MD  lisinopril-hydrochlorothiazide (PRINZIDE,ZESTORETIC) 10-12.5 MG tablet Take 1 tablet by mouth at bedtime. 0000 08/24/17  Yes Weber, [name] L, PA-C  metFORMIN (GLUCOPHAGE-XR) 500 MG 24 hr tablet TAKE 2 TABLETS (1,000 MG TOTAL) BY MOUTH 2 (TWO) TIMES DAILY. 08/24/17  Yes Weber, [name] L, PA-C  NON FORMULARY CPAP machine at bedtime & sleep   Yes [provider]  tiZANidine (ZANAFLEX) 4 MG capsule Take 1 capsule (4 mg total) by mouth 2 (two) times daily. 08/24/17  Yes Weber, Dema Severin, PA-C  Vitamin D, Ergocalciferol, (DRISDOL) 50000 units CAPS capsule Take 1 capsule (50,000 Units total) by mouth every 7 (seven) days. 10/20/17  Yes Quillian Quince D, MD     Allergies  Allergen Reactions  . Penicillin G Anaphylaxis    Gets extremely high fevers.  . Sulfa Antibiotics Other (See Comments)    It will kill him  . Amoxicillin   . Cephalosporins Other (See Comments)    Reaction unspecified.  . Quinolones Other (See Comments)    Reaction unspecified  . Sulfamethoxazole Swelling       Objective:  Physical Exam  Constitutional: He is oriented to person, place, and time. He appears well-developed and well-nourished. No distress.  BP 128/84 (BP Location: Right Arm, Patient Position: Sitting, Cuff Size: Large)   Pulse 87   Temp 98.4 F (36.9 C) (Oral)   Resp 18   Ht 5\' 8"  (1.727 m)   Wt 296 lb 3.2 oz (134.4 kg)   SpO2 97%   BMI 45.04 kg/m    HENT:  Head: Normocephalic and atraumatic.  Right Ear: Hearing, tympanic membrane, external ear and ear canal normal.  Left Ear: Hearing, tympanic membrane, external ear and ear canal normal.  Nose: Mucosal edema and rhinorrhea present.  No foreign bodies. Right sinus exhibits no maxillary sinus tenderness and no frontal sinus tenderness. Left sinus exhibits no maxillary sinus tenderness and  no frontal sinus tenderness.  Mouth/Throat: Uvula is midline, oropharynx is clear and moist and mucous membranes are normal. No uvula swelling. No oropharyngeal exudate.  Eyes: Pupils are equal, round, and reactive to light. Conjunctivae and EOM are normal. Right eye exhibits no discharge. Left eye exhibits no discharge. No scleral icterus.  Neck: Trachea normal, normal range of motion and full passive range of motion without pain. Neck supple. No thyroid mass and no thyromegaly present.  Cardiovascular: Normal rate, regular rhythm and normal heart sounds.  Pulmonary/Chest: Effort normal and breath sounds normal.  Lymphadenopathy:       Head (right side): No submandibular, no tonsillar, no preauricular, no posterior auricular and no occipital adenopathy present.       Head (left side): No submandibular, no tonsillar, no preauricular and no occipital adenopathy present.    He has no cervical adenopathy.       Right: No supraclavicular adenopathy present.       Left: No supraclavicular adenopathy present.  Neurological: He is alert and oriented to person, place, and time. He has normal strength. No cranial nerve deficit or sensory deficit.  Skin: Skin is warm, dry and intact. No rash  noted.  Psychiatric: He has a normal mood and affect. His speech is normal and behavior is normal.       Wt Readings from Last 3 Encounters:  10/27/17 296 lb 3.2 oz (134.4 kg)  10/20/17 295 lb (133.8 kg)  10/04/17 297 lb (134.7 kg)   Dg Chest 2 View  Result Date: 10/27/2017 CLINICAL DATA:  Cough with chest tightness EXAM: CHEST - 2 VIEW COMPARISON:  07/08/2017 FINDINGS: The heart size and mediastinal contours are within normal limits. Both lungs are clear. Mild degenerative changes of the spine. IMPRESSION: No active cardiopulmonary disease. Electronically Signed   By: Jasmine Pang M.D.   On: 10/27/2017 14:29       Assessment & Plan:   1. Viral upper respiratory illness Supportive care.  Anticipatory  guidance.  RTC if symptoms worsen/persist. - benzonatate (TESSALON) 100 MG capsule; Take 1-2 capsules (100-200 mg total) by mouth 3 (three) times daily as needed for cough.  Dispense: 40 capsule; Refill: 0 - Guaifenesin (MUCINEX MAXIMUM STRENGTH) 1200 MG TB12; Take 1 tablet (1,200 mg total) by mouth every 12 (twelve) hours as needed.  Dispense: 14 tablet; Refill: 1 - azelastine (ASTELIN) 0.1 % nasal spray; Place 2 sprays into both nostrils 2 (two) times daily. Use in each nostril as directed  Dispense: 30 mL; Refill: 0  2. Cough Reassuring CXR, but recent exposure and SOB, with chronic illnesses that reduce immunity, elect to cover for bacterial cause bronchitis. - DG Chest 2 View; Future - doxycycline (VIBRAMYCIN) 100 MG capsule; Take 1 capsule (100 mg total) by mouth 2 (two) times daily for 10 days.  Dispense: 20 capsule; Refill: 0    Return if symptoms worsen or fail to improve.   Fernande Bras, PA-C [facility]

## 2017-10-27 NOTE — Progress Notes (Signed)
Subjective:    Patient ID: Brandon [name], male    DOB: 19-Dec-1970, 47 y.o.   MRN: [id number] Chief Complaint  Patient presents with  . Chest Congestion    x4 days, pt states he able to cough dark green and yellow mucus. Pt states he has chest pressure on and off and is experiencing fatigue.    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 He states he has had upper chest congestion, productive cough (dull yellow, dark grey), nasal congestion, fatigue, cold sweats and shortness of breath.   He also notes 2 days of diarrhea. Denies hematochezia.  Per pt, he has been exposed to PNA, via his roommate Brandon [name]. His other roommate, Brandon [name] is also sick.   Denies any history of allergies. Denies wheezing, measured fevers.  He has been using nyquil, dayquil and nasal spray with some relief.    Review of Systems  Constitutional: Positive for appetite change, chills and fatigue. Negative for activity change, diaphoresis and fever.  HENT: Positive for postnasal drip, rhinorrhea, sinus pressure, sneezing, sore throat and voice change. Negative for ear discharge, ear pain, sinus pain and trouble swallowing.   Eyes: Negative.   Respiratory: Positive for cough and shortness of breath ("comes and goes"). Negative for chest tightness and wheezing.   Cardiovascular: Negative for chest pain and palpitations.  Gastrointestinal: Positive for diarrhea and nausea. Negative for abdominal pain, anal bleeding, constipation and vomiting.  Endocrine: Negative.   Genitourinary: Negative for difficulty urinating, dysuria, enuresis, flank pain, frequency, hematuria and urgency.  Musculoskeletal: Positive for arthralgias (chronic). Negative for back pain.  Skin: Negative.   Allergic/Immunologic: Negative for environmental allergies.  Neurological: Positive for weakness. Negative for dizziness, light-headedness and headaches.  Psychiatric/Behavioral: Negative.      Patient  Active Problem List   Diagnosis Date Noted  . Other fatigue 09/15/2017  . Shortness of breath on exertion 09/15/2017  . Type 2 diabetes mellitus without complication, without long-term current use of insulin (HCC) 09/15/2017  . Other hyperlipidemia 09/15/2017  . Essential hypertension 09/15/2017  . DDD (degenerative disc disease), cervical 05/03/2017  . Cervical herniated disc 10/21/2016  . Fatty liver 09/22/2016  . Spinal stenosis in cervical region 08/05/2016  . Diabetes type 2, controlled (HCC) 10/15/2015  . Hypertriglyceridemia 10/15/2015  . Benign essential HTN 12/05/2014  . Macroglossia 09/05/2014  . GAD (generalized anxiety disorder) 12/11/2013  . Motor tic disorder 12/11/2013  . OSA on CPAP 10/04/2013  . Hypersomnia with sleep apnea, unspecified 10/04/2013  . High frequency hearing loss 08/24/2013  . Peripheral neuropathy 04/19/2013  . Abnormal gait 10/19/2012  . Cervical myelopathy (HCC) 10/19/2012  . Esophageal reflux 07/27/2011  . Morbid obesity due to excess calories (HCC) 07/22/2011  . ADD (attention deficit disorder) 07/22/2011  . ADEM (acute disseminated encephalomyelitis) 07/03/2011  . Splenomegaly 07/03/2011  . Elevated LFTs 07/03/2011    Past Medical History:  Diagnosis Date  . ADEM (acute disseminated encephalomyelitis)   . Allergy   . Anxiety 2005  . Complication of anesthesia   . Depression 2005  . Difficult intubation    difficult intubating and exbutating "throat Clinched around tube"  . Headache(784.0)   . Hearing loss   . Hyperinsulinemia 07/04/11   "I'm on metformin cause I produce too much insulin"  . Hyperlipidemia   . Hypertension   . Mononucleosis, infectious, with hepatitis 06/2011  . MS (multiple sclerosis) (HCC)    patinet has ADEM not MS per Patient  .  Nausea    occasional per history form 08/18/11  . Neuromuscular disorder (HCC)   . Pneumonia 2010  . Sleep apnea    uses CPAP at home  . Stroke (HCC) 2000   TIA  . TIA (transient  ischemic attack) 2000   denies residual  . TIA (transient ischemic attack)     Prior to Admission medications   Medication Sig Start Date End Date Taking? Authorizing Provider  atorvastatin (LIPITOR) 20 MG tablet Take 1 tablet (20 mg total) by mouth at bedtime. (0000) 08/24/17  Yes Weber, Dema Severin, PA-C  colesevelam [facility]) 625 MG tablet Take 3 tablets (1,875 mg total) by mouth daily with breakfast. 08/24/17  Yes Weber, Dema Severin, PA-C  diclofenac (VOLTAREN) 75 MG EC tablet Take 1 tablet (75 mg total) by mouth 2 (two) times daily. 08/24/17  Yes Weber, [name] L, PA-C  gabapentin (NEURONTIN) 300 MG capsule Take 3 capsules (900 mg total) by mouth 2 (two) times daily. 08/24/17  Yes Weber, [name] L, PA-C  Insulin Pen Needle (BD PEN NEEDLE NANO U/F) 32G X 4 MM MISC 1 Package by Does not apply route 2 (two) times daily. 10/04/17  Yes Beasley, Caren D, MD  liraglutide (VICTOZA) 18 MG/3ML SOPN Inject 0.1 mLs (0.6 mg total) into the skin every morning. 10/04/17  Yes Beasley, Caren D, MD  lisinopril-hydrochlorothiazide (PRINZIDE,ZESTORETIC) 10-12.5 MG tablet Take 1 tablet by mouth at bedtime. 0000 08/24/17  Yes Weber, [name] L, PA-C  metFORMIN (GLUCOPHAGE-XR) 500 MG 24 hr tablet TAKE 2 TABLETS (1,000 MG TOTAL) BY MOUTH 2 (TWO) TIMES DAILY. 08/24/17  Yes Weber, [name] L, PA-C  NON FORMULARY CPAP machine at bedtime & sleep   Yes [provider]  tiZANidine (ZANAFLEX) 4 MG capsule Take 1 capsule (4 mg total) by mouth 2 (two) times daily. 08/24/17  Yes Weber, Dema Severin, PA-C  Vitamin D, Ergocalciferol, (DRISDOL) 50000 units CAPS capsule Take 1 capsule (50,000 Units total) by mouth every 7 (seven) days. 10/20/17  Yes Quillian Quince D, MD    Allergies  Allergen Reactions  . Penicillin G Anaphylaxis    Gets extremely high fevers.  . Sulfa Antibiotics Other (See Comments)    It will kill him  . Amoxicillin   . Cephalosporins Other (See Comments)    Reaction unspecified.  . Quinolones Other (See Comments)     Reaction unspecified  . Sulfamethoxazole Swelling        Objective:   Physical Exam  Constitutional: He is oriented to person, place, and time. He appears well-developed and well-nourished. No distress.  BP 128/84 (BP Location: Right Arm, Patient Position: Sitting, Cuff Size: Large)   Pulse 87   Temp 98.4 F (36.9 C) (Oral)   Resp 18   Ht 5\' 8"  (1.727 m)   Wt 296 lb 3.2 oz (134.4 kg)   SpO2 97%   BMI 45.04 kg/m    HENT:  Head: Normocephalic and atraumatic.  Nose: Mucosal edema and rhinorrhea present. Right sinus exhibits no maxillary sinus tenderness and no frontal sinus tenderness. Left sinus exhibits no maxillary sinus tenderness and no frontal sinus tenderness.  Mouth/Throat: Mucous membranes are normal. No posterior oropharyngeal edema or posterior oropharyngeal erythema.  Eyes: Pupils are equal, round, and reactive to light. Conjunctivae and EOM are normal. Right eye exhibits no discharge. Left eye exhibits no discharge.  Neck: Normal range of motion. Neck supple. No tracheal deviation present. No thyromegaly present.  Cardiovascular: Normal rate, regular rhythm and intact distal pulses. Exam reveals no  gallop and no friction rub.  No murmur heard. Pulses:      Radial pulses are 2+ on the right side, and 2+ on the left side.       Dorsalis pedis pulses are 2+ on the right side, and 2+ on the left side.       Posterior tibial pulses are 2+ on the right side, and 2+ on the left side.  Pulmonary/Chest: Effort normal and breath sounds normal. No respiratory distress. He has no wheezes. He has no rales.  Musculoskeletal: Normal range of motion. He exhibits no edema.  Neurological: He is alert and oriented to person, place, and time. He has normal reflexes.  Skin: Skin is warm and dry. He is not diaphoretic. No erythema.  Psychiatric: He has a normal mood and affect. His behavior is normal.   Dg Chest 2 View  Result Date: 10/27/2017 CLINICAL DATA:  Cough with chest tightness  EXAM: CHEST - 2 VIEW COMPARISON:  07/08/2017 FINDINGS: The heart size and mediastinal contours are within normal limits. Both lungs are clear. Mild degenerative changes of the spine. IMPRESSION: No active cardiopulmonary disease. Electronically Signed   By: Jasmine Pang M.D.   On: 10/27/2017 14:29       Assessment & Plan:   1. Viral upper respiratory illness Use mucinex, tessalon and astelin for supportive treatment. - benzonatate (TESSALON) 100 MG capsule; Take 1-2 capsules (100-200 mg total) by mouth 3 (three) times daily as needed for cough.  Dispense: 40 capsule; Refill: 0 - Guaifenesin (MUCINEX MAXIMUM STRENGTH) 1200 MG TB12; Take 1 tablet (1,200 mg total) by mouth every 12 (twelve) hours as needed.  Dispense: 14 tablet; Refill: 1 - azelastine (ASTELIN) 0.1 % nasal spray; Place 2 sprays into both nostrils 2 (two) times daily. Use in each nostril as directed  Dispense: 30 mL; Refill: 0  2. Cough Despite negative x-ray, given pt exposure to 2 sick contacts and his immunocompromised state, lower threshold to treat.  Return precautions thoroughly explained.   - DG Chest 2 View; Future - doxycycline (VIBRAMYCIN) 100 MG capsule; Take 1 capsule (100 mg total) by mouth 2 (two) times daily for 10 days.  Dispense: 20 capsule; Refill: 0  Return if symptoms worsen or fail to improve.

## 2017-10-27 NOTE — Patient Instructions (Addendum)
Very reassuring radiographs. Doxycycline will treat infection in the lungs AND the sinuses, though I suspect that if there is a bacterial infection, it is in the sinuses. Get plenty of rest and drink plenty of liquids.    IF you received an x-ray today, you will receive an invoice from [facility]. Please contact [facility] at [phone] with questions or concerns regarding your invoice.   IF you received labwork today, you will receive an invoice from [facility]. Please contact [facility] at [phone] with questions or concerns regarding your invoice.   Our billing staff will not be able to assist you with questions regarding bills from these companies.  You will be contacted with the lab results as soon as they are available. The fastest way to get your results is to activate your My Chart account. Instructions are located on the last page of this paperwork. If you have not heard from [place] regarding the results in 2 weeks, please contact this office.

## 2017-11-09 ENCOUNTER — Other Ambulatory Visit (INDEPENDENT_AMBULATORY_CARE_PROVIDER_SITE_OTHER): Payer: Self-pay | Admitting: Family Medicine

## 2017-11-09 DIAGNOSIS — E559 Vitamin D deficiency, unspecified: Secondary | ICD-10-CM

## 2017-11-10 ENCOUNTER — Ambulatory Visit (INDEPENDENT_AMBULATORY_CARE_PROVIDER_SITE_OTHER): Payer: 59 | Admitting: Family Medicine

## 2017-11-10 VITALS — BP 133/79 | HR 73 | Temp 98.1°F | Ht 68.0 in | Wt 301.0 lb

## 2017-11-10 DIAGNOSIS — R7303 Prediabetes: Secondary | ICD-10-CM | POA: Diagnosis not present

## 2017-11-10 DIAGNOSIS — Z6841 Body Mass Index (BMI) 40.0 and over, adult: Secondary | ICD-10-CM

## 2017-11-10 DIAGNOSIS — E66813 Obesity, class 3: Secondary | ICD-10-CM

## 2017-11-10 NOTE — Progress Notes (Signed)
Office: [phone]  /  Fax: [phone]   HPI:   Chief Complaint: OBESITY Brandon [name] is here to discuss his progress with his obesity treatment plan. He is on the keep a food journal with 2300 calories and 100+ grams of protein daily and is following his eating plan approximately 40 % of the time. He states he is walking more. [name] went on vacation and overindulged in celebration eating but still tried to journal and noticed calories were as high as 6,000 calories a day. He is ready to get back on track.  His weight is (!) 301 lb (136.5 kg) today and has gained 6 pounds since his last visit. He has lost 4 lbs since starting treatment with [place].  Pre-Diabetes Brandon [name] has a diagnosis of pre-diabetes based on his elevated Hgb A1c and was informed this puts him at greater risk of developing diabetes. He started Victoza in addition to metformin and feels it helped his polyphagia. He denies nausea, vomiting, or hypoglycemia. He is back to working on diet prescription, weight loss, and exercise to decrease risk of diabetes.   ALLERGIES: Allergies  Allergen Reactions  . Penicillin G Anaphylaxis    Gets extremely high fevers.  . Sulfa Antibiotics Other (See Comments)    It will kill him  . Amoxicillin   . Cephalosporins Other (See Comments)    Reaction unspecified.  . Quinolones Other (See Comments)    Reaction unspecified  . Sulfamethoxazole Swelling    MEDICATIONS: Current Outpatient Medications on File Prior to Visit  Medication Sig Dispense Refill  . atorvastatin (LIPITOR) 20 MG tablet Take 1 tablet (20 mg total) by mouth at bedtime. (0000) 90 tablet 1  . azelastine (ASTELIN) 0.1 % nasal spray Place 2 sprays into both nostrils 2 (two) times daily. Use in each nostril as directed 30 mL 0  . benzonatate (TESSALON) 100 MG capsule Take 1-2 capsules (100-200 mg total) by mouth 3 (three) times daily as needed for cough. 40 capsule 0  . colesevelam (WELCHOL) 625 MG tablet Take 3 tablets  (1,875 mg total) by mouth daily with breakfast. 540 tablet 1  . diclofenac (VOLTAREN) 75 MG EC tablet Take 1 tablet (75 mg total) by mouth 2 (two) times daily. 180 tablet 1  . gabapentin (NEURONTIN) 300 MG capsule Take 3 capsules (900 mg total) by mouth 2 (two) times daily. 540 capsule 1  . Guaifenesin (MUCINEX MAXIMUM STRENGTH) 1200 MG TB12 Take 1 tablet (1,200 mg total) by mouth every 12 (twelve) hours as needed. 14 tablet 1  . Insulin Pen Needle (BD PEN NEEDLE NANO U/F) 32G X 4 MM MISC 1 Package by Does not apply route 2 (two) times daily. 100 each 0  . liraglutide (VICTOZA) 18 MG/3ML SOPN Inject 0.1 mLs (0.6 mg total) into the skin every morning. 1 pen 0  . lisinopril-hydrochlorothiazide (PRINZIDE,ZESTORETIC) 10-12.5 MG tablet Take 1 tablet by mouth at bedtime. 0000 90 tablet 1  . metFORMIN (GLUCOPHAGE-XR) 500 MG 24 hr tablet TAKE 2 TABLETS (1,000 MG TOTAL) BY MOUTH 2 (TWO) TIMES DAILY. 360 tablet 1  . NON FORMULARY CPAP machine at bedtime & sleep    . tiZANidine (ZANAFLEX) 4 MG capsule Take 1 capsule (4 mg total) by mouth 2 (two) times daily. 180 capsule 1  . Vitamin D, Ergocalciferol, (DRISDOL) 50000 units CAPS capsule Take 1 capsule (50,000 Units total) by mouth every 7 (seven) days. 4 capsule 0   No current facility-administered medications on file prior to visit.  PAST MEDICAL HISTORY: Past Medical History:  Diagnosis Date  . ADEM (acute disseminated encephalomyelitis)   . Allergy   . Anxiety 2005  . Complication of anesthesia   . Depression 2005  . Difficult intubation    difficult intubating and exbutating "throat Clinched around tube"  . Headache(784.0)   . Hearing loss   . Hyperinsulinemia 07/04/11   "I'm on metformin cause I produce too much insulin"  . Hyperlipidemia   . Hypertension   . Mononucleosis, infectious, with hepatitis 06/2011  . MS (multiple sclerosis) (HCC)    patinet has ADEM not MS per Patient  . Nausea    occasional per history form 08/18/11  .  Neuromuscular disorder (HCC)   . Pneumonia 2010  . Sleep apnea    uses CPAP at home  . Stroke (HCC) 2000   TIA  . TIA (transient ischemic attack) 2000   denies residual  . TIA (transient ischemic attack)     PAST SURGICAL HISTORY: Past Surgical History:  Procedure Laterality Date  . ANTERIOR CERVICAL DECOMP/DISCECTOMY FUSION N/A 10/21/2016   Procedure: ANTERIOR CERVICAL DECOMPRESSION/DISCECTOMY FUSION, INTERBODY PROSTHESIS,PLATE CERVICAL FOUR- CERVICAL FIVE, CERVICAL FIVE- CERVICAL SIX;  Surgeon: Tressie Stalker, MD;  Location: [facility] OR;  Service: Neurosurgery;  Laterality: N/A;  . CHOLECYSTECTOMY  08/02/2011   Procedure: LAPAROSCOPIC CHOLECYSTECTOMY;  Surgeon: Shelly Rubenstein, MD;  Location: [facility] OR;  Service: General;;  . COLONOSCOPY  2003  . ESOPHAGOGASTRODUODENOSCOPY  2003  . ESOPHAGOGASTRODUODENOSCOPY  07/28/2011   Procedure: ESOPHAGOGASTRODUODENOSCOPY (EGD);  Surgeon: [name], MD;  Location: [facility] ENDOSCOPY;  Service: Endoscopy;  Laterality: N/A;  . MYRINGOTOMY  1979; 1980   bilaterally  . TONSILLECTOMY AND ADENOIDECTOMY  ~ 1980    SOCIAL HISTORY: Social History   Tobacco Use  . Smoking status: Former Smoker    Packs/day: 3.00    Years: 3.00    Pack years: 9.00    Types: Cigarettes    Last attempt to quit: 08/18/1987    Years since quitting: 30.2  . Smokeless tobacco: Never Used  Substance Use Topics  . Alcohol use: Yes    Alcohol/week: 0.0 oz    Comment: 07/04/11 "glass of wine or spirits once a month"  . Drug use: Yes    Types: Marijuana    Comment: have not used in a 1.5 years    FAMILY HISTORY: Family History  Problem Relation Age of Onset  . Coronary artery disease Father   . Stroke Father   . Cancer Father   . High blood pressure Father   . Alcoholism Father   . Diabetes Mother   . Obesity Mother   . Cancer Maternal Grandmother        bone  . Cancer Other        Stomach -Great-great grandmother    ROS: Review of Systems  Constitutional: Negative  for weight loss.  Gastrointestinal: Negative for nausea and vomiting.  Endo/Heme/Allergies:       Positive polyphagia Negative hypoglycemia    PHYSICAL EXAM: Blood pressure 133/79, pulse 73, temperature 98.1 F (36.7 C), temperature source Oral, height 5\' 8"  (1.727 m), weight (!) 301 lb (136.5 kg), SpO2 96 %. Body mass index is 45.77 kg/m. Physical Exam  Constitutional: He is oriented to person, place, and time. He appears well-developed and well-nourished.  Cardiovascular: Normal rate.  Pulmonary/Chest: Effort normal.  Musculoskeletal: Normal range of motion.  Neurological: He is oriented to person, place, and time.  Skin: Skin is warm and dry.  Psychiatric: He  has a normal mood and affect. His behavior is normal.  Vitals reviewed.   RECENT LABS AND TESTS: BMET    Component Value Date/Time   NA 142 09/15/2017 0902   K 3.7 09/15/2017 0902   CL 100 09/15/2017 0902   CO2 25 09/15/2017 0902   GLUCOSE 86 09/15/2017 0902   GLUCOSE 88 08/24/2017 1458   BUN 13 09/15/2017 0902   CREATININE 0.95 09/15/2017 0902   CREATININE 1.02 08/24/2017 1458   CALCIUM 9.4 09/15/2017 0902   GFRNONAA 96 09/15/2017 0902   GFRNONAA >89 07/18/2015 1656   GFRAA 111 09/15/2017 0902   GFRAA >89 07/18/2015 1656   Lab Results  Component Value Date   HGBA1C 5.7 (H) 09/15/2017   HGBA1C 5.6 08/24/2017   HGBA1C 6.5 (H) 02/18/2017   HGBA1C 5.5 09/17/2016   HGBA1C 6.3 (H) 10/15/2015   Lab Results  Component Value Date   INSULIN 110.6 (H) 09/15/2017   CBC    Component Value Date/Time   WBC 6.3 09/15/2017 0902   WBC 6.2 10/12/2016 1441   RBC 5.74 09/15/2017 0902   RBC 5.27 10/12/2016 1441   HGB 16.6 09/15/2017 0902   HCT 49.5 09/15/2017 0902   PLT 151 10/12/2016 1441   PLT 158 09/17/2016 1611   MCV 86 09/15/2017 0902   MCH 28.9 09/15/2017 0902   MCH 28.1 10/12/2016 1441   MCHC 33.5 09/15/2017 0902   MCHC 34.0 10/12/2016 1441   RDW 14.7 09/15/2017 0902   LYMPHSABS 2.2 09/15/2017 0902    MONOABS 0.5 12/05/2014 1538   EOSABS 0.1 09/15/2017 0902   BASOSABS 0.0 09/15/2017 0902   Iron/TIBC/Ferritin/ %Sat No results found for: IRON, TIBC, FERRITIN, IRONPCTSAT Lipid Panel     Component Value Date/Time   CHOL 111 09/15/2017 0902   TRIG 339 (H) 09/15/2017 0902   HDL 31 (L) 09/15/2017 0902   CHOLHDL 3.4 08/24/2017 1458   VLDL 70 (H) 10/15/2015 1112   LDLCALC 12 09/15/2017 0902   LDLCALC 57 08/24/2017 1458   Hepatic Function Panel     Component Value Date/Time   PROT 6.7 09/15/2017 0902   ALBUMIN 4.4 09/15/2017 0902   AST 24 09/15/2017 0902   ALT 60 (H) 09/15/2017 0902   ALKPHOS 58 09/15/2017 0902   BILITOT 0.5 09/15/2017 0902   BILIDIR 0.1 04/11/2015 1638   IBILI 0.6 04/11/2015 1638      Component Value Date/Time   TSH 2.080 09/15/2017 0902   TSH 3.190 09/17/2016 1611   TSH 2.339 12/05/2014 1538    ASSESSMENT AND PLAN: Prediabetes  Class 3 severe obesity with serious comorbidity and body mass index (BMI) of 45.0 to 49.9 in adult, unspecified obesity type (HCC)  PLAN:  Pre-Diabetes Elisa will continue to work on weight loss, exercise, and decreasing simple carbohydrates in his diet to help decrease the risk of diabetes. We dicussed metformin including benefits and risks. He was informed that eating too many simple carbohydrates or too many calories at one sitting increases the likelihood of GI side effects. Makael agrees to continue Victoza 0.6 and metformin, get back to diet prescription and weight loss. Frances agrees to follow up with our clinic in 2 weeks as directed to monitor his progress.  We spent > than 50% of the 30 minute visit on the counseling as documented in the note.  Obesity Suhaas is currently in the action stage of change. As such, his goal is to continue with weight loss efforts He has agreed to keep a food journal  with 2300 calories and 100+ grams of protein daily Nevin has been instructed to work up to a goal of 150 minutes of  combined cardio and strengthening exercise per week for weight loss and overall health benefits. We discussed the following Behavioral Modification Strategies today: increasing lean protein intake, decreasing simple carbohydrates  and work on meal planning and easy cooking plans   Kery has agreed to follow up with our clinic in 2 weeks. He was informed of the importance of frequent follow up visits to maximize his success with intensive lifestyle modifications for his multiple health conditions.   OBESITY BEHAVIORAL INTERVENTION VISIT  Today's visit was # 4 out of 22.  Starting weight: 305 lbs Starting date: 09/15/17 Today's weight : 301 lbs  Today's date: 11/10/2017 Total lbs lost to date: 4 (Patients must lose 7 lbs in the first 6 months to continue with counseling)   ASK: We discussed the diagnosis of obesity with Dahlia Byes today and [name] agreed to give [place] permission to discuss obesity behavioral modification therapy today.  ASSESS: Gatsby has the diagnosis of obesity and his BMI today is 45.78 Dusan is in the action stage of change   ADVISE: Kahlan was educated on the multiple health risks of obesity as well as the benefit of weight loss to improve his health. He was advised of the need for long term treatment and the importance of lifestyle modifications.  AGREE: Multiple dietary modification options and treatment options were discussed and  Datwan agreed to the above obesity treatment plan.  I, Burt Knack, am acting as transcriptionist for Quillian Quince, MD  I have reviewed the above documentation for accuracy and completeness, and I agree with the above. -Quillian Quince, MD

## 2017-11-16 ENCOUNTER — Other Ambulatory Visit: Payer: Self-pay | Admitting: Physician Assistant

## 2017-11-16 DIAGNOSIS — J069 Acute upper respiratory infection, unspecified: Secondary | ICD-10-CM

## 2017-11-18 ENCOUNTER — Other Ambulatory Visit: Payer: Self-pay | Admitting: Physician Assistant

## 2017-11-18 ENCOUNTER — Other Ambulatory Visit (INDEPENDENT_AMBULATORY_CARE_PROVIDER_SITE_OTHER): Payer: Self-pay | Admitting: Family Medicine

## 2017-11-18 DIAGNOSIS — E559 Vitamin D deficiency, unspecified: Secondary | ICD-10-CM

## 2017-11-18 DIAGNOSIS — J069 Acute upper respiratory infection, unspecified: Secondary | ICD-10-CM

## 2017-11-19 ENCOUNTER — Ambulatory Visit (INDEPENDENT_AMBULATORY_CARE_PROVIDER_SITE_OTHER): Payer: 59 | Admitting: Physician Assistant

## 2017-11-19 ENCOUNTER — Encounter: Payer: Self-pay | Admitting: Physician Assistant

## 2017-11-19 ENCOUNTER — Other Ambulatory Visit: Payer: Self-pay

## 2017-11-19 DIAGNOSIS — M5030 Other cervical disc degeneration, unspecified cervical region: Secondary | ICD-10-CM | POA: Diagnosis not present

## 2017-11-19 MED ORDER — TIZANIDINE HCL 4 MG PO CAPS: 4.0000 mg | ORAL_CAPSULE | Freq: Three times a day (TID) | ORAL | 1 refills | Status: DC

## 2017-11-19 NOTE — Patient Instructions (Addendum)
  If you start the think you are having heartburn - you can take Zantac 150mg  2x/day   IF you received an x-ray today, you will receive an invoice from [facility]. Please contact [facility] at [phone] with questions or concerns regarding your invoice.   IF you received labwork today, you will receive an invoice from [facility]. Please contact [facility] at [phone] with questions or concerns regarding your invoice.   Our billing staff will not be able to assist you with questions regarding bills from these companies.  You will be contacted with the lab results as soon as they are available. The fastest way to get your results is to activate your My Chart account. Instructions are located on the last page of this paperwork. If you have not heard from [place] regarding the results in 2 weeks, please contact this office.

## 2017-11-19 NOTE — Progress Notes (Signed)
Brandon [name]  MRN: [id number] DOB: Jan 13, 1971  PCP: Morrell Riddle, PA-C  Chief Complaint  Patient presents with  . Diabetes    follow up     Subjective:  Pt presents to clinic for chronic medical follow-up.  He believes he is here for his diabetes recheck.  He has started seeing [name] for weight management and has labs there regularly.  Last labs were done in March with an A1c of5.7.  He has within the last 5 weeks been started on Victozia due to very high insulin levels.  He has noticed since the start of this medication that his appetite is slightly decreased.  And [name] suspect that his really high insulin levels may be resulting him in him being hungry all day long.  He is compliant with the diet that they gave him about 60% of the time.  He is trying to get more exercise though not completely compliant with that.  He still misses the Amrix for his body muscle spasms.  He is using Zanaflex 4 mg twice daily but he finds that he wanting to take it a little bit early because it takes quite a while for it to act after he has taken it.  He is wondering if there is anything else that he can do.  He has episodes of postnasal drip and a tickle in his throat.  This seems to be worse after eating large meals and taking naps.  This did start about the time of a sinus infection but the sinus symptoms have completely resolved though those symptoms continue.  He has had this in the past.  He is tried nothing for it.  He does feel like over the last several weeks it has started getting better.  History is obtained by patient.  Review of Systems  HENT: Positive for postnasal drip (after large meals) and sneezing (after large meals).   Respiratory: Negative for cough and shortness of breath.   Musculoskeletal: Positive for myalgias.    Patient Active Problem List   Diagnosis Date Noted  . Other fatigue 09/15/2017  . Shortness of breath on exertion 09/15/2017  . Type 2 diabetes  mellitus without complication, without long-term current use of insulin (HCC) 09/15/2017  . Other hyperlipidemia 09/15/2017  . Essential hypertension 09/15/2017  . DDD (degenerative disc disease), cervical 05/03/2017  . Cervical herniated disc 10/21/2016  . Fatty liver 09/22/2016  . Spinal stenosis in cervical region 08/05/2016  . Diabetes type 2, controlled (HCC) 10/15/2015  . Hypertriglyceridemia 10/15/2015  . Benign essential HTN 12/05/2014  . Macroglossia 09/05/2014  . GAD (generalized anxiety disorder) 12/11/2013  . Motor tic disorder 12/11/2013  . OSA on CPAP 10/04/2013  . Hypersomnia with sleep apnea, unspecified 10/04/2013  . High frequency hearing loss 08/24/2013  . Peripheral neuropathy 04/19/2013  . Abnormal gait 10/19/2012  . Cervical myelopathy (HCC) 10/19/2012  . Esophageal reflux 07/27/2011  . Morbid obesity due to excess calories (HCC) 07/22/2011  . ADD (attention deficit disorder) 07/22/2011  . ADEM (acute disseminated encephalomyelitis) 07/03/2011  . Splenomegaly 07/03/2011  . Elevated LFTs 07/03/2011    Current Outpatient Medications on File Prior to Visit  Medication Sig Dispense Refill  . atorvastatin (LIPITOR) 20 MG tablet Take 1 tablet (20 mg total) by mouth at bedtime. (0000) 90 tablet 1  . azelastine (ASTELIN) 0.1 % nasal spray INHALE 2 SPRAYS INTO BOTH NOSTRILS 2 TIMES DAILY 30 mL 0  . benzonatate (TESSALON) 100 MG capsule Take 1-2  capsules (100-200 mg total) by mouth 3 (three) times daily as needed for cough. 40 capsule 0  . colesevelam (WELCHOL) 625 MG tablet Take 3 tablets (1,875 mg total) by mouth daily with breakfast. 540 tablet 1  . diclofenac (VOLTAREN) 75 MG EC tablet Take 1 tablet (75 mg total) by mouth 2 (two) times daily. 180 tablet 1  . gabapentin (NEURONTIN) 300 MG capsule Take 3 capsules (900 mg total) by mouth 2 (two) times daily. 540 capsule 1  . Guaifenesin (MUCINEX MAXIMUM STRENGTH) 1200 MG TB12 Take 1 tablet (1,200 mg total) by mouth  every 12 (twelve) hours as needed. 14 tablet 1  . Insulin Pen Needle (BD PEN NEEDLE NANO U/F) 32G X 4 MM MISC 1 Package by Does not apply route 2 (two) times daily. 100 each 0  . liraglutide (VICTOZA) 18 MG/3ML SOPN Inject 0.1 mLs (0.6 mg total) into the skin every morning. 1 pen 0  . lisinopril-hydrochlorothiazide (PRINZIDE,ZESTORETIC) 10-12.5 MG tablet Take 1 tablet by mouth at bedtime. 0000 90 tablet 1  . metFORMIN (GLUCOPHAGE-XR) 500 MG 24 hr tablet TAKE 2 TABLETS (1,000 MG TOTAL) BY MOUTH 2 (TWO) TIMES DAILY. 360 tablet 1  . NON FORMULARY CPAP machine at bedtime & sleep    . Vitamin D, Ergocalciferol, (DRISDOL) 50000 units CAPS capsule Take 1 capsule (50,000 Units total) by mouth every 7 (seven) days. 4 capsule 0   No current facility-administered medications on file prior to visit.     Allergies  Allergen Reactions  . Penicillin G Anaphylaxis    Gets extremely high fevers.  . Sulfa Antibiotics Other (See Comments)    It will kill him  . Amoxicillin   . Cephalosporins Other (See Comments)    Reaction unspecified.  . Quinolones Other (See Comments)    Reaction unspecified  . Sulfamethoxazole Swelling    Past Medical History:  Diagnosis Date  . ADEM (acute disseminated encephalomyelitis)   . Allergy   . Anxiety 2005  . Complication of anesthesia   . Depression 2005  . Difficult intubation    difficult intubating and exbutating "throat Clinched around tube"  . Headache(784.0)   . Hearing loss   . Hyperinsulinemia 07/04/11   "I'm on metformin cause I produce too much insulin"  . Hyperlipidemia   . Hypertension   . Mononucleosis, infectious, with hepatitis 06/2011  . MS (multiple sclerosis) (HCC)    patinet has ADEM not MS per Patient  . Nausea    occasional per history form 08/18/11  . Neuromuscular disorder (HCC)   . Pneumonia 2010  . Sleep apnea    uses CPAP at home  . Stroke (HCC) 2000   TIA  . TIA (transient ischemic attack) 2000   denies residual  . TIA  (transient ischemic attack)    Social History   Social History Narrative   Lives in [place] with 2 domestic male partners "his family unit"   Caffeine: minimal   Social History   Tobacco Use  . Smoking status: Former Smoker    Packs/day: 3.00    Years: 3.00    Pack years: 9.00    Types: Cigarettes    Last attempt to quit: 08/18/1987    Years since quitting: 30.2  . Smokeless tobacco: Never Used  Substance Use Topics  . Alcohol use: Yes    Alcohol/week: 0.0 oz    Comment: 07/04/11 "glass of wine or spirits once a month"  . Drug use: Yes    Types: Marijuana  Comment: have not used in a 1.5 years   family history includes Alcoholism in his father; Cancer in his father, maternal grandmother, and other; Coronary artery disease in his father; Diabetes in his mother; High blood pressure in his father; Obesity in his mother; Stroke in his father.     Objective:  BP 124/88   Pulse 76   Temp 99.2 F (37.3 C) (Oral)   Resp 18   Ht 5\' 8"  (1.727 m)   Wt 296 lb 9.6 oz (134.5 kg)   SpO2 97%   BMI 45.10 kg/m  Body mass index is 45.1 kg/m.  Physical Exam  Constitutional: He is oriented to person, place, and time. He appears well-developed and well-nourished.  HENT:  Head: Normocephalic and atraumatic.  Right Ear: Hearing, tympanic membrane, external ear and ear canal normal.  Left Ear: Hearing, tympanic membrane, external ear and ear canal normal.  Nose: Mucosal edema (right greater than left) present.  Mouth/Throat: Uvula is midline, oropharynx is clear and moist and mucous membranes are normal.  Eyes: Conjunctivae are normal.  Neck: Normal range of motion.  Cardiovascular: Normal rate, regular rhythm and normal heart sounds.  Pulmonary/Chest: Effort normal and breath sounds normal. He has no wheezes.  Lymphadenopathy:       Head (right side): No tonsillar adenopathy present.       Head (left side): No tonsillar adenopathy present.    He has no cervical adenopathy.        Right: No supraclavicular adenopathy present.       Left: No supraclavicular adenopathy present.  Neurological: He is alert and oriented to person, place, and time.  Skin: Skin is warm and dry.  Psychiatric: Judgment normal.  Vitals reviewed.   Assessment and Plan :  DDD (degenerative disc disease), cervical - Plan: tiZANidine (ZANAFLEX) 4 MG capsule -we will increase Zanaflex to 3 times daily dosing to see if that will control his muscle spasms better.  PND - I question if his postnasal drip and resulting cough is related to reflux, especially since it is happening after large meals or eating and then laying-he will try Zantac as needed.  And continue to monitor.  Will not do labs on patient today as he is just had these done in the middle of March.  As he is going to weight loss clinic I will continue to monitor his labs but I think his next appointment can be in 6 months with me.  Benny Lennert PA-C  Primary Care at [facility] 11/19/2017 3:27 PM

## 2017-11-20 ENCOUNTER — Encounter: Payer: Self-pay | Admitting: Physician Assistant

## 2017-11-24 ENCOUNTER — Ambulatory Visit (INDEPENDENT_AMBULATORY_CARE_PROVIDER_SITE_OTHER): Payer: 59 | Admitting: Family Medicine

## 2017-11-24 VITALS — BP 143/81 | HR 56 | Temp 98.1°F | Ht 68.0 in | Wt 295.0 lb

## 2017-11-24 DIAGNOSIS — Z6841 Body Mass Index (BMI) 40.0 and over, adult: Secondary | ICD-10-CM

## 2017-11-24 DIAGNOSIS — R7303 Prediabetes: Secondary | ICD-10-CM | POA: Diagnosis not present

## 2017-11-24 DIAGNOSIS — E559 Vitamin D deficiency, unspecified: Secondary | ICD-10-CM | POA: Diagnosis not present

## 2017-11-24 MED ORDER — VITAMIN D (ERGOCALCIFEROL) 1.25 MG (50000 UNIT) PO CAPS: 50000.0000 [IU] | ORAL_CAPSULE | ORAL | 0 refills | Status: DC

## 2017-11-25 NOTE — Progress Notes (Signed)
Office: [phone]  /  Fax: [phone]   HPI:   Chief Complaint: OBESITY Brandon [name] is here to discuss his progress with his obesity treatment plan. He is on the keep a food journal with 2300 calories and 100+ grams of protein daily and is following his eating plan approximately 50 % of the time. He states he is swimming for 45 minutes 1-2 times per week. Brandon [name] continues to do well with weight loss. Hunger is mostly controlled and he is increasing his exercise. He had some stress eating this last week.  His weight is 295 lb (133.8 kg) today and has had a weight loss of 6 pounds over a period of 2 weeks since his last visit. He has lost 10 lbs since starting treatment with Brandon [name].  Vitamin D Deficiency Brandon [name] has a diagnosis of vitamin D deficiency. He is stable on prescription Vit D, not yet at goal. She denies nausea, vomiting or muscle weakness.  Pre-Diabetes Brandon [name] has a diagnosis of pre-diabetes based on his elevated Hgb A1c and was informed this puts him at greater risk of developing diabetes. He is on metformin and Victoza, he notes decreased polyphagia and denies nausea, vomiting, or hypoglycemia. He continues to work on diet and exercise to decrease risk of diabetes.  ALLERGIES: Allergies  Allergen Reactions  . Penicillin G Anaphylaxis    Gets extremely high fevers.  . Sulfa Antibiotics Other (See Comments)    It will kill him  . Amoxicillin   . Cephalosporins Other (See Comments)    Reaction unspecified.  . Quinolones Other (See Comments)    Reaction unspecified  . Sulfamethoxazole Swelling    MEDICATIONS: Current Outpatient Medications on File Prior to Visit  Medication Sig Dispense Refill  . atorvastatin (LIPITOR) 20 MG tablet Take 1 tablet (20 mg total) by mouth at bedtime. (0000) 90 tablet 1  . azelastine (ASTELIN) 0.1 % nasal spray INHALE 2 SPRAYS INTO BOTH NOSTRILS 2 TIMES DAILY 30 mL 0  . benzonatate (TESSALON) 100 MG capsule Take 1-2 capsules (100-200 mg  total) by mouth 3 (three) times daily as needed for cough. 40 capsule 0  . colesevelam (WELCHOL) 625 MG tablet Take 3 tablets (1,875 mg total) by mouth daily with breakfast. 540 tablet 1  . diclofenac (VOLTAREN) 75 MG EC tablet Take 1 tablet (75 mg total) by mouth 2 (two) times daily. 180 tablet 1  . gabapentin (NEURONTIN) 300 MG capsule Take 3 capsules (900 mg total) by mouth 2 (two) times daily. 540 capsule 1  . Guaifenesin (MUCINEX MAXIMUM STRENGTH) 1200 MG TB12 Take 1 tablet (1,200 mg total) by mouth every 12 (twelve) hours as needed. 14 tablet 1  . Insulin Pen [name] (BD PEN [name] NANO U/F) 32G X 4 MM MISC 1 Package by Does not apply route 2 (two) times daily. 100 each 0  . liraglutide (VICTOZA) 18 MG/3ML SOPN Inject 0.1 mLs (0.6 mg total) into the skin every morning. 1 pen 0  . lisinopril-hydrochlorothiazide (PRINZIDE,ZESTORETIC) 10-12.5 MG tablet Take 1 tablet by mouth at bedtime. 0000 90 tablet 1  . metFORMIN (GLUCOPHAGE-XR) 500 MG 24 hr tablet TAKE 2 TABLETS (1,000 MG TOTAL) BY MOUTH 2 (TWO) TIMES DAILY. 360 tablet 1  . NON FORMULARY CPAP machine at bedtime & sleep    . tiZANidine (ZANAFLEX) 4 MG capsule Take 1 capsule (4 mg total) by mouth 3 (three) times daily. 270 capsule 1   No current facility-administered medications on file prior to visit.     PAST MEDICAL  HISTORY: Past Medical History:  Diagnosis Date  . ADEM (acute disseminated encephalomyelitis)   . Allergy   . Anxiety 2005  . Complication of anesthesia   . Depression 2005  . Difficult intubation    difficult intubating and exbutating "throat Clinched around tube"  . Headache(784.0)   . Hearing loss   . Hyperinsulinemia 07/04/11   "I'm on metformin cause I produce too much insulin"  . Hyperlipidemia   . Hypertension   . Mononucleosis, infectious, with hepatitis 06/2011  . MS (multiple sclerosis) (HCC)    patinet has ADEM not MS per Patient  . Nausea    occasional per history form 08/18/11  . Neuromuscular  disorder (HCC)   . Pneumonia 2010  . Sleep apnea    uses CPAP at home  . Stroke (HCC) 2000   TIA  . TIA (transient ischemic attack) 2000   denies residual  . TIA (transient ischemic attack)     PAST SURGICAL HISTORY: Past Surgical History:  Procedure Laterality Date  . ANTERIOR CERVICAL DECOMP/DISCECTOMY FUSION N/A 10/21/2016   Procedure: ANTERIOR CERVICAL DECOMPRESSION/DISCECTOMY FUSION, INTERBODY PROSTHESIS,PLATE CERVICAL FOUR- CERVICAL FIVE, CERVICAL FIVE- CERVICAL SIX;  Surgeon: Tressie Stalker, MD;  Location: [facility] OR;  Service: Neurosurgery;  Laterality: N/A;  . CHOLECYSTECTOMY  08/02/2011   Procedure: LAPAROSCOPIC CHOLECYSTECTOMY;  Surgeon: Shelly Rubenstein, MD;  Location: [facility] OR;  Service: General;;  . COLONOSCOPY  2003  . ESOPHAGOGASTRODUODENOSCOPY  2003  . ESOPHAGOGASTRODUODENOSCOPY  07/28/2011   Procedure: ESOPHAGOGASTRODUODENOSCOPY (EGD);  Surgeon: [name], MD;  Location: [facility] ENDOSCOPY;  Service: Endoscopy;  Laterality: N/A;  . MYRINGOTOMY  1979; 1980   bilaterally  . TONSILLECTOMY AND ADENOIDECTOMY  ~ 1980    SOCIAL HISTORY: Social History   Tobacco Use  . Smoking status: Former Smoker    Packs/day: 3.00    Years: 3.00    Pack years: 9.00    Types: Cigarettes    Last attempt to quit: 08/18/1987    Years since quitting: 30.2  . Smokeless tobacco: Never Used  Substance Use Topics  . Alcohol use: Yes    Alcohol/week: 0.0 oz    Comment: 07/04/11 "glass of wine or spirits once a month"  . Drug use: Yes    Types: Marijuana    Comment: have not used in a 1.5 years    FAMILY HISTORY: Family History  Problem Relation Age of Onset  . Coronary artery disease Father   . Stroke Father   . Cancer Father   . High blood pressure Father   . Alcoholism Father   . Diabetes Mother   . Obesity Mother   . Cancer Maternal Grandmother        bone  . Cancer Other        Stomach -Great-great grandmother    ROS: Review of Systems  Constitutional: Positive for weight  loss.  Gastrointestinal: Negative for nausea and vomiting.  Musculoskeletal:       Negative muscle weakness  Endo/Heme/Allergies:       Positive polyphagia Negative hypoglycemia    PHYSICAL EXAM: Blood pressure (!) 143/81, pulse (!) 56, temperature 98.1 F (36.7 C), temperature source Oral, height 5\' 8"  (1.727 m), weight 295 lb (133.8 kg), SpO2 96 %. Body mass index is 44.85 kg/m. Physical Exam  Constitutional: He is oriented to person, place, and time. He appears well-developed and well-nourished.  Cardiovascular: Normal rate.  Pulmonary/Chest: Effort normal.  Musculoskeletal: Normal range of motion.  Neurological: He is oriented to person, place, and time.  Skin: Skin is warm and dry.  Psychiatric: He has a normal mood and affect. His behavior is normal.  Vitals reviewed.   RECENT LABS AND TESTS: BMET    Component Value Date/Time   NA 142 09/15/2017 0902   K 3.7 09/15/2017 0902   CL 100 09/15/2017 0902   CO2 25 09/15/2017 0902   GLUCOSE 86 09/15/2017 0902   GLUCOSE 88 08/24/2017 1458   BUN 13 09/15/2017 0902   CREATININE 0.95 09/15/2017 0902   CREATININE 1.02 08/24/2017 1458   CALCIUM 9.4 09/15/2017 0902   GFRNONAA 96 09/15/2017 0902   GFRNONAA >89 07/18/2015 1656   GFRAA 111 09/15/2017 0902   GFRAA >89 07/18/2015 1656   Lab Results  Component Value Date   HGBA1C 5.7 (H) 09/15/2017   HGBA1C 5.6 08/24/2017   HGBA1C 6.5 (H) 02/18/2017   HGBA1C 5.5 09/17/2016   HGBA1C 6.3 (H) 10/15/2015   Lab Results  Component Value Date   INSULIN 110.6 (H) 09/15/2017   CBC    Component Value Date/Time   WBC 6.3 09/15/2017 0902   WBC 6.2 10/12/2016 1441   RBC 5.74 09/15/2017 0902   RBC 5.27 10/12/2016 1441   HGB 16.6 09/15/2017 0902   HCT 49.5 09/15/2017 0902   PLT 151 10/12/2016 1441   PLT 158 09/17/2016 1611   MCV 86 09/15/2017 0902   MCH 28.9 09/15/2017 0902   MCH 28.1 10/12/2016 1441   MCHC 33.5 09/15/2017 0902   MCHC 34.0 10/12/2016 1441   RDW 14.7  09/15/2017 0902   LYMPHSABS 2.2 09/15/2017 0902   MONOABS 0.5 12/05/2014 1538   EOSABS 0.1 09/15/2017 0902   BASOSABS 0.0 09/15/2017 0902   Iron/TIBC/Ferritin/ %Sat No results found for: IRON, TIBC, FERRITIN, IRONPCTSAT Lipid Panel     Component Value Date/Time   CHOL 111 09/15/2017 0902   TRIG 339 (H) 09/15/2017 0902   HDL 31 (L) 09/15/2017 0902   CHOLHDL 3.4 08/24/2017 1458   VLDL 70 (H) 10/15/2015 1112   LDLCALC 12 09/15/2017 0902   LDLCALC 57 08/24/2017 1458   Hepatic Function Panel     Component Value Date/Time   PROT 6.7 09/15/2017 0902   ALBUMIN 4.4 09/15/2017 0902   AST 24 09/15/2017 0902   ALT 60 (H) 09/15/2017 0902   ALKPHOS 58 09/15/2017 0902   BILITOT 0.5 09/15/2017 0902   BILIDIR 0.1 04/11/2015 1638   IBILI 0.6 04/11/2015 1638      Component Value Date/Time   TSH 2.080 09/15/2017 0902   TSH 3.190 09/17/2016 1611   TSH 2.339 12/05/2014 1538  Results for CHEVON, LAUFER "[name]" (MRN [id number]) as of 11/25/2017 11:11  Ref. Range 09/15/2017 09:02  Vitamin D, 25-Hydroxy Latest Ref Range: 30.0 - 100.0 ng/mL 10.9 (L)    ASSESSMENT AND PLAN: Vitamin D deficiency - Plan: Vitamin D, Ergocalciferol, (DRISDOL) 50000 units CAPS capsule  Prediabetes  Class 3 severe obesity with serious comorbidity and body mass index (BMI) of 40.0 to 44.9 in adult, unspecified obesity type (HCC)  PLAN:  Vitamin D Deficiency Brandon [name] was informed that low vitamin D levels contributes to fatigue and are associated with obesity, breast, and colon cancer. Brandon [name] agrees to continue taking prescription Vit D @50 ,000 IU every week #4 and we will refill for 1 month. He will follow up for routine testing of vitamin D, at least 2-3 times per year. He was informed of the risk of over-replacement of vitamin D and agrees to not increase his dose unless he discusses this with  Brandon [name] first.  Brandon [name] will continue to work on weight loss, diet, exercise, and decreasing simple  carbohydrates in his diet to help decrease the risk of diabetes. We dicussed metformin including benefits and risks. He was informed that eating too many simple carbohydrates or too many calories at one sitting increases the likelihood of GI side effects. Brandon [name] agrees to continue metformin and Victoza, and she agrees to follow up with our clinic in 3 weeks as directed to monitor his progress.  Obesity Brandon [name] is currently in the action stage of change. As such, his goal is to continue with weight loss efforts He has agreed to keep a food journal with 2300 calories and 100+ grams of protein daily Brandon [name] has been instructed to work up to a goal of 150 minutes of combined cardio and strengthening exercise per week for weight loss and overall health benefits. We discussed the following Behavioral Modification Strategies today: increasing lean protein intake, decreasing simple carbohydrates, work on meal planning and easy cooking plans, dealing with family or coworker sabotage and travel eating strategies    Brandon [name] has agreed to follow up with our clinic in 3 weeks. He was informed of the importance of frequent follow up visits to maximize his success with intensive lifestyle modifications for his multiple health conditions.   OBESITY BEHAVIORAL INTERVENTION VISIT  Today's visit was # 5 out of 22.  Starting weight: 305 lbs Starting date: 09/15/17 Today's weight : 295 lbs  Today's date: 11/24/2017 Total lbs lost to date: 10 (Patients must lose 7 lbs in the first 6 months to continue with counseling)   ASK: We discussed the diagnosis of obesity with Brandon [name] today and Brandon [name] agreed to give Brandon [name] permission to discuss obesity behavioral modification therapy today.  ASSESS: Brandon [name] has the diagnosis of obesity and his BMI today is 44.86 Brandon [name] is in the action stage of change   ADVISE: Brandon [name] was educated on the multiple health risks of obesity as well as the benefit of weight loss to  improve his health. He was advised of the need for long term treatment and the importance of lifestyle modifications.  AGREE: Multiple dietary modification options and treatment options were discussed and  Shivam agreed to the above obesity treatment plan.  I, Burt Knack, am acting as transcriptionist for Brandon Quince, MD  I have reviewed the above documentation for accuracy and completeness, and I agree with the above. -Brandon Quince, MD

## 2017-11-26 ENCOUNTER — Ambulatory Visit: Payer: 59 | Admitting: Physician Assistant

## 2017-12-20 ENCOUNTER — Other Ambulatory Visit (INDEPENDENT_AMBULATORY_CARE_PROVIDER_SITE_OTHER): Payer: Self-pay | Admitting: Family Medicine

## 2017-12-20 DIAGNOSIS — E559 Vitamin D deficiency, unspecified: Secondary | ICD-10-CM

## 2017-12-22 ENCOUNTER — Other Ambulatory Visit: Payer: Self-pay | Admitting: Physician Assistant

## 2017-12-22 ENCOUNTER — Ambulatory Visit (INDEPENDENT_AMBULATORY_CARE_PROVIDER_SITE_OTHER): Payer: 59 | Admitting: Family Medicine

## 2017-12-22 VITALS — BP 128/79 | HR 57 | Temp 97.9°F | Ht 68.0 in | Wt 294.0 lb

## 2017-12-22 DIAGNOSIS — Z9189 Other specified personal risk factors, not elsewhere classified: Secondary | ICD-10-CM | POA: Diagnosis not present

## 2017-12-22 DIAGNOSIS — J069 Acute upper respiratory infection, unspecified: Secondary | ICD-10-CM

## 2017-12-22 DIAGNOSIS — Z6841 Body Mass Index (BMI) 40.0 and over, adult: Secondary | ICD-10-CM

## 2017-12-22 DIAGNOSIS — E559 Vitamin D deficiency, unspecified: Secondary | ICD-10-CM | POA: Diagnosis not present

## 2017-12-22 MED ORDER — VITAMIN D (ERGOCALCIFEROL) 1.25 MG (50000 UNIT) PO CAPS: 50000.0000 [IU] | ORAL_CAPSULE | ORAL | 0 refills | Status: DC

## 2017-12-22 NOTE — Progress Notes (Signed)
Office: [phone]  /  Fax: [phone]   HPI:   Chief Complaint: OBESITY Brandon [name] is here to discuss his progress with his obesity treatment plan. He is on the keep a food journal with 2300 calories and 100+ grams of protein daily and is following his eating plan approximately 45 % of the time. He states he is swimming and walking for 30 minutes 2 times per week. Brandon [name] has been doing more traveling and increased eating out but is working on increasing lean protein and vegetables, and being more mindful about food options.  His weight is 294 lb (133.4 kg) today and has had a weight loss of 1 pounds over a period of 4 weeks since his last visit. He has lost 11 lbs since starting treatment with [place].  Vitamin D Deficiency Brandon [name] has a diagnosis of vitamin D deficiency. He is stable on prescription Vit D. He denies nausea, vomiting or muscle weakness and notes fatigue is improving.  At risk for osteopenia and osteoporosis Brandon [name] is at higher risk of osteopenia and osteoporosis due to vitamin D deficiency.   ALLERGIES: Allergies  Allergen Reactions  . Penicillin G Anaphylaxis    Gets extremely high fevers.  . Sulfa Antibiotics Other (See Comments)    It will kill him  . Amoxicillin   . Cephalosporins Other (See Comments)    Reaction unspecified.  . Quinolones Other (See Comments)    Reaction unspecified  . Sulfamethoxazole Swelling    MEDICATIONS: Current Outpatient Medications on File Prior to Visit  Medication Sig Dispense Refill  . atorvastatin (LIPITOR) 20 MG tablet Take 1 tablet (20 mg total) by mouth at bedtime. (0000) 90 tablet 1  . azelastine (ASTELIN) 0.1 % nasal spray INHALE 2 SPRAYS INTO BOTH NOSTRILS 2 TIMES DAILY 30 mL 0  . benzonatate (TESSALON) 100 MG capsule Take 1-2 capsules (100-200 mg total) by mouth 3 (three) times daily as needed for cough. 40 capsule 0  . colesevelam (WELCHOL) 625 MG tablet Take 3 tablets (1,875 mg total) by mouth daily with breakfast.  540 tablet 1  . diclofenac (VOLTAREN) 75 MG EC tablet Take 1 tablet (75 mg total) by mouth 2 (two) times daily. 180 tablet 1  . gabapentin (NEURONTIN) 300 MG capsule Take 3 capsules (900 mg total) by mouth 2 (two) times daily. 540 capsule 1  . Guaifenesin (MUCINEX MAXIMUM STRENGTH) 1200 MG TB12 Take 1 tablet (1,200 mg total) by mouth every 12 (twelve) hours as needed. 14 tablet 1  . Insulin Pen [name] (BD PEN [name] NANO U/F) 32G X 4 MM MISC 1 Package by Does not apply route 2 (two) times daily. 100 each 0  . liraglutide (VICTOZA) 18 MG/3ML SOPN Inject 0.1 mLs (0.6 mg total) into the skin every morning. 1 pen 0  . lisinopril-hydrochlorothiazide (PRINZIDE,ZESTORETIC) 10-12.5 MG tablet Take 1 tablet by mouth at bedtime. 0000 90 tablet 1  . metFORMIN (GLUCOPHAGE-XR) 500 MG 24 hr tablet TAKE 2 TABLETS (1,000 MG TOTAL) BY MOUTH 2 (TWO) TIMES DAILY. 360 tablet 1  . NON FORMULARY CPAP machine at bedtime & sleep    . tiZANidine (ZANAFLEX) 4 MG capsule Take 1 capsule (4 mg total) by mouth 3 (three) times daily. 270 capsule 1  . Vitamin D, Ergocalciferol, (DRISDOL) 50000 units CAPS capsule Take 1 capsule (50,000 Units total) by mouth every 7 (seven) days. 4 capsule 0   No current facility-administered medications on file prior to visit.     PAST MEDICAL HISTORY: Past Medical History:  Diagnosis Date  . ADEM (acute disseminated encephalomyelitis)   . Allergy   . Anxiety 2005  . Complication of anesthesia   . Depression 2005  . Difficult intubation    difficult intubating and exbutating "throat Clinched around tube"  . Headache(784.0)   . Hearing loss   . Hyperinsulinemia 07/04/11   "I'm on metformin cause I produce too much insulin"  . Hyperlipidemia   . Hypertension   . Mononucleosis, infectious, with hepatitis 06/2011  . MS (multiple sclerosis) (HCC)    patinet has ADEM not MS per Patient  . Nausea    occasional per history form 08/18/11  . Neuromuscular disorder (HCC)   . Pneumonia 2010    . Sleep apnea    uses CPAP at home  . Stroke (HCC) 2000   TIA  . TIA (transient ischemic attack) 2000   denies residual  . TIA (transient ischemic attack)     PAST SURGICAL HISTORY: Past Surgical History:  Procedure Laterality Date  . ANTERIOR CERVICAL DECOMP/DISCECTOMY FUSION N/A 10/21/2016   Procedure: ANTERIOR CERVICAL DECOMPRESSION/DISCECTOMY FUSION, INTERBODY PROSTHESIS,PLATE CERVICAL FOUR- CERVICAL FIVE, CERVICAL FIVE- CERVICAL SIX;  Surgeon: Tressie Stalker, MD;  Location: [facility] OR;  Service: Neurosurgery;  Laterality: N/A;  . CHOLECYSTECTOMY  08/02/2011   Procedure: LAPAROSCOPIC CHOLECYSTECTOMY;  Surgeon: Shelly Rubenstein, MD;  Location: [facility] OR;  Service: General;;  . COLONOSCOPY  2003  . ESOPHAGOGASTRODUODENOSCOPY  2003  . ESOPHAGOGASTRODUODENOSCOPY  07/28/2011   Procedure: ESOPHAGOGASTRODUODENOSCOPY (EGD);  Surgeon: [name], MD;  Location: [facility] ENDOSCOPY;  Service: Endoscopy;  Laterality: N/A;  . MYRINGOTOMY  1979; 1980   bilaterally  . TONSILLECTOMY AND ADENOIDECTOMY  ~ 1980    SOCIAL HISTORY: Social History   Tobacco Use  . Smoking status: Former Smoker    Packs/day: 3.00    Years: 3.00    Pack years: 9.00    Types: Cigarettes    Last attempt to quit: 08/18/1987    Years since quitting: 30.3  . Smokeless tobacco: Never Used  Substance Use Topics  . Alcohol use: Yes    Alcohol/week: 0.0 oz    Comment: 07/04/11 "glass of wine or spirits once a month"  . Drug use: Yes    Types: Marijuana    Comment: have not used in a 1.5 years    FAMILY HISTORY: Family History  Problem Relation Age of Onset  . Coronary artery disease Father   . Stroke Father   . Cancer Father   . High blood pressure Father   . Alcoholism Father   . Diabetes Mother   . Obesity Mother   . Cancer Maternal Grandmother        bone  . Cancer Other        Stomach -Great-great grandmother    ROS: Review of Systems  Constitutional: Positive for malaise/fatigue and weight loss.   Gastrointestinal: Negative for nausea and vomiting.  Musculoskeletal:       Negative muscle weakness    PHYSICAL EXAM: Blood pressure 128/79, pulse (!) 57, temperature 97.9 F (36.6 C), temperature source Oral, height 5\' 8"  (1.727 m), weight 294 lb (133.4 kg), SpO2 96 %. Body mass index is 44.7 kg/m. Physical Exam  Constitutional: He is oriented to person, place, and time. He appears well-developed and well-nourished.  Cardiovascular: Normal rate.  Pulmonary/Chest: Effort normal.  Musculoskeletal: Normal range of motion.  Neurological: He is oriented to person, place, and time.  Skin: Skin is warm and dry.  Psychiatric: He has a normal mood and  affect. His behavior is normal.  Vitals reviewed.   RECENT LABS AND TESTS: BMET    Component Value Date/Time   NA 142 09/15/2017 0902   K 3.7 09/15/2017 0902   CL 100 09/15/2017 0902   CO2 25 09/15/2017 0902   GLUCOSE 86 09/15/2017 0902   GLUCOSE 88 08/24/2017 1458   BUN 13 09/15/2017 0902   CREATININE 0.95 09/15/2017 0902   CREATININE 1.02 08/24/2017 1458   CALCIUM 9.4 09/15/2017 0902   GFRNONAA 96 09/15/2017 0902   GFRNONAA >89 07/18/2015 1656   GFRAA 111 09/15/2017 0902   GFRAA >89 07/18/2015 1656   Lab Results  Component Value Date   HGBA1C 5.7 (H) 09/15/2017   HGBA1C 5.6 08/24/2017   HGBA1C 6.5 (H) 02/18/2017   HGBA1C 5.5 09/17/2016   HGBA1C 6.3 (H) 10/15/2015   Lab Results  Component Value Date   INSULIN 110.6 (H) 09/15/2017   CBC    Component Value Date/Time   WBC 6.3 09/15/2017 0902   WBC 6.2 10/12/2016 1441   RBC 5.74 09/15/2017 0902   RBC 5.27 10/12/2016 1441   HGB 16.6 09/15/2017 0902   HCT 49.5 09/15/2017 0902   PLT 151 10/12/2016 1441   PLT 158 09/17/2016 1611   MCV 86 09/15/2017 0902   MCH 28.9 09/15/2017 0902   MCH 28.1 10/12/2016 1441   MCHC 33.5 09/15/2017 0902   MCHC 34.0 10/12/2016 1441   RDW 14.7 09/15/2017 0902   LYMPHSABS 2.2 09/15/2017 0902   MONOABS 0.5 12/05/2014 1538   EOSABS  0.1 09/15/2017 0902   BASOSABS 0.0 09/15/2017 0902   Iron/TIBC/Ferritin/ %Sat No results found for: IRON, TIBC, FERRITIN, IRONPCTSAT Lipid Panel     Component Value Date/Time   CHOL 111 09/15/2017 0902   TRIG 339 (H) 09/15/2017 0902   HDL 31 (L) 09/15/2017 0902   CHOLHDL 3.4 08/24/2017 1458   VLDL 70 (H) 10/15/2015 1112   LDLCALC 12 09/15/2017 0902   LDLCALC 57 08/24/2017 1458   Hepatic Function Panel     Component Value Date/Time   PROT 6.7 09/15/2017 0902   ALBUMIN 4.4 09/15/2017 0902   AST 24 09/15/2017 0902   ALT 60 (H) 09/15/2017 0902   ALKPHOS 58 09/15/2017 0902   BILITOT 0.5 09/15/2017 0902   BILIDIR 0.1 04/11/2015 1638   IBILI 0.6 04/11/2015 1638      Component Value Date/Time   TSH 2.080 09/15/2017 0902   TSH 3.190 09/17/2016 1611   TSH 2.339 12/05/2014 1538  Results for HELIO, LACK "[name]" (MRN [id number]) as of 12/22/2017 16:29  Ref. Range 09/15/2017 09:02  Vitamin D, 25-Hydroxy Latest Ref Range: 30.0 - 100.0 ng/mL 10.9 (L)    ASSESSMENT AND PLAN: Vitamin D deficiency - Plan: Vitamin D, Ergocalciferol, (DRISDOL) 50000 units CAPS capsule  At risk for osteoporosis  Class 3 severe obesity with serious comorbidity and body mass index (BMI) of 40.0 to 44.9 in adult, unspecified obesity type (HCC)  PLAN:  Vitamin D Deficiency Brandon [name] was informed that low vitamin D levels contributes to fatigue and are associated with obesity, breast, and colon cancer. Brandon [name] agrees to continue taking prescription Vit D @50 ,000 IU every week #4 and we will refill for 1 month. He will follow up for routine testing of vitamin D, at least 2-3 times per year. He was informed of the risk of over-replacement of vitamin D and agrees to not increase his dose unless he discusses this with [place] first. Brandon [name] agrees to follow up with our clinic in  3 weeks.  At risk for osteopenia and osteoporosis Brandon [name] is at risk for osteopenia and osteoporsis due to his vitamin D deficiency. He  was encouraged to take his vitamin D and follow his higher calcium diet and increase strengthening exercise to help strengthen his bones and decrease his risk of osteopenia and osteoporosis.  Obesity Brandon [name] is currently in the action stage of change. As such, his goal is to continue with weight loss efforts He has agreed to keep a food journal with 1800-2300 calories and 100+ grams of protein daily Brandon [name] has been instructed to work up to a goal of 150 minutes of combined cardio and strengthening exercise per week or nordic walking sticks for weight loss and overall health benefits. We discussed the following Behavioral Modification Strategies today: decreasing simple carbohydrates and travel eating strategies    Brandon [name] has agreed to follow up with our clinic in 3 weeks. He was informed of the importance of frequent follow up visits to maximize his success with intensive lifestyle modifications for his multiple health conditions.   OBESITY BEHAVIORAL INTERVENTION VISIT  Today's visit was # 6 out of 22.  Starting weight: 305 lbs Starting date: 09/15/17 Today's weight : 294 lbs  Today's date: 12/22/2017 Total lbs lost to date: 11 (Patients must lose 7 lbs in the first 6 months to continue with counseling)   ASK: We discussed the diagnosis of obesity with Dahlia Byes today and Brandon [name] agreed to give [place] permission to discuss obesity behavioral modification therapy today.  ASSESS: Brandon [name] has the diagnosis of obesity and his BMI today is 44.71 Brandon [name] is in the action stage of change   ADVISE: Brandon [name] was educated on the multiple health risks of obesity as well as the benefit of weight loss to improve his health. He was advised of the need for long term treatment and the importance of lifestyle modifications.  AGREE: Multiple dietary modification options and treatment options were discussed and  Brandon [name] agreed to the above obesity treatment plan.  I, Burt Knack, am acting as  transcriptionist for Quillian Quince, MD  I have reviewed the above documentation for accuracy and completeness, and I agree with the above. -Quillian Quince, MD

## 2018-01-06 ENCOUNTER — Other Ambulatory Visit: Payer: Self-pay | Admitting: Physician Assistant

## 2018-01-06 DIAGNOSIS — J069 Acute upper respiratory infection, unspecified: Secondary | ICD-10-CM

## 2018-01-07 NOTE — Telephone Encounter (Signed)
azelastine refill Last Refill:12/23/17 # 30 ml Last OV: 11/19/17 PCP:  Benny Lennert PA Pharmacy:[facility] [address]

## 2018-01-07 NOTE — Telephone Encounter (Signed)
Called pharmacy and pharmacist said rx refill request did go through and they did receive it.

## 2018-01-17 ENCOUNTER — Ambulatory Visit (INDEPENDENT_AMBULATORY_CARE_PROVIDER_SITE_OTHER): Payer: 59 | Admitting: Family Medicine

## 2018-01-26 ENCOUNTER — Ambulatory Visit (INDEPENDENT_AMBULATORY_CARE_PROVIDER_SITE_OTHER): Payer: 59 | Admitting: Family Medicine

## 2018-01-26 VITALS — BP 125/78 | HR 68 | Temp 98.3°F | Ht 68.0 in | Wt 299.0 lb

## 2018-01-26 DIAGNOSIS — Z9189 Other specified personal risk factors, not elsewhere classified: Secondary | ICD-10-CM | POA: Diagnosis not present

## 2018-01-26 DIAGNOSIS — Z6841 Body Mass Index (BMI) 40.0 and over, adult: Secondary | ICD-10-CM

## 2018-01-26 DIAGNOSIS — E119 Type 2 diabetes mellitus without complications: Secondary | ICD-10-CM

## 2018-01-26 DIAGNOSIS — E559 Vitamin D deficiency, unspecified: Secondary | ICD-10-CM | POA: Diagnosis not present

## 2018-01-27 MED ORDER — LIRAGLUTIDE 18 MG/3ML ~~LOC~~ SOPN: 0.6000 mg | PEN_INJECTOR | SUBCUTANEOUS | 0 refills | Status: DC

## 2018-01-27 MED ORDER — VITAMIN D (ERGOCALCIFEROL) 1.25 MG (50000 UNIT) PO CAPS: 50000.0000 [IU] | ORAL_CAPSULE | ORAL | 0 refills | Status: DC

## 2018-01-27 MED ORDER — INSULIN PEN NEEDLE 32G X 4 MM MISC: 1.0000 | Freq: Two times a day (BID) | 0 refills | Status: DC

## 2018-01-27 NOTE — Progress Notes (Signed)
Office: [phone]  /  Fax: [phone]   HPI:   Chief Complaint: OBESITY Brandon [name] is here to discuss his progress with his obesity treatment plan. He is on the keep a food journal with 1800 to 2300 calories and 100+ grams of protein daily and is following his eating plan approximately 35 % of the time. He states he is walking 20 minutes 2 times per week. Brandon [name] states he got off track in the last month. He has had increased emotional eating and he increased carb's over the last month; but states he is now ready to get back on track. His weight is 299 lb (135.6 kg) today and has had a weight gain of 5 pounds over a period of 5 weeks since his last visit. He has lost 6 lbs since starting treatment with [place].  Diabetes II Brandon [name] has a diagnosis of diabetes type II. He is stable on Victoza and Metformin. Alta denies any hypoglycemic episodes, nausea or vomiting. Last A1c was at 5.7 He has been working on intensive lifestyle modifications including diet, exercise, and weight loss to help control his blood glucose levels.  At risk for cardiovascular disease Brandon [name] is at a higher than average risk for cardiovascular disease due to obesity and diabetes. He currently denies any chest pain.  Vitamin D deficiency Brandon [name] has a diagnosis of vitamin D deficiency. He is stable on vit D but is not yet at goal. Brandon [name] reports no change in fatigue and he denies nausea, vomiting or muscle weakness.  ALLERGIES: Allergies  Allergen Reactions  . Penicillin G Anaphylaxis    Gets extremely high fevers.  . Sulfa Antibiotics Other (See Comments)    It will kill him  . Amoxicillin   . Cephalosporins Other (See Comments)    Reaction unspecified.  . Quinolones Other (See Comments)    Reaction unspecified  . Sulfamethoxazole Swelling    MEDICATIONS: Current Outpatient Medications on File Prior to Visit  Medication Sig Dispense Refill  . atorvastatin (LIPITOR) 20 MG tablet Take 1 tablet (20 mg  total) by mouth at bedtime. (0000) 90 tablet 1  . azelastine (ASTELIN) 0.1 % nasal spray USE 2 SPRAYS IN EACH NOSTRIL TWICE A DAY 30 mL 1  . benzonatate (TESSALON) 100 MG capsule Take 1-2 capsules (100-200 mg total) by mouth 3 (three) times daily as needed for cough. 40 capsule 0  . colesevelam (WELCHOL) 625 MG tablet Take 3 tablets (1,875 mg total) by mouth daily with breakfast. 540 tablet 1  . diclofenac (VOLTAREN) 75 MG EC tablet Take 1 tablet (75 mg total) by mouth 2 (two) times daily. 180 tablet 1  . gabapentin (NEURONTIN) 300 MG capsule Take 3 capsules (900 mg total) by mouth 2 (two) times daily. 540 capsule 1  . Guaifenesin (MUCINEX MAXIMUM STRENGTH) 1200 MG TB12 Take 1 tablet (1,200 mg total) by mouth every 12 (twelve) hours as needed. 14 tablet 1  . Insulin Pen [name] (BD PEN [name] NANO U/F) 32G X 4 MM MISC 1 Package by Does not apply route 2 (two) times daily. (Patient taking differently: 1 Package by Does not apply route daily. ) 100 each 0  . lisinopril-hydrochlorothiazide (PRINZIDE,ZESTORETIC) 10-12.5 MG tablet Take 1 tablet by mouth at bedtime. 0000 90 tablet 1  . metFORMIN (GLUCOPHAGE-XR) 500 MG 24 hr tablet TAKE 2 TABLETS (1,000 MG TOTAL) BY MOUTH 2 (TWO) TIMES DAILY. 360 tablet 1  . NON FORMULARY CPAP machine at bedtime & sleep    . tiZANidine (ZANAFLEX) 4 MG  capsule Take 1 capsule (4 mg total) by mouth 3 (three) times daily. 270 capsule 1   No current facility-administered medications on file prior to visit.     PAST MEDICAL HISTORY: Past Medical History:  Diagnosis Date  . ADEM (acute disseminated encephalomyelitis)   . Allergy   . Anxiety 2005  . Complication of anesthesia   . Depression 2005  . Difficult intubation    difficult intubating and exbutating "throat Clinched around tube"  . Headache(784.0)   . Hearing loss   . Hyperinsulinemia 07/04/11   "I'm on metformin cause I produce too much insulin"  . Hyperlipidemia   . Hypertension   . Mononucleosis,  infectious, with hepatitis 06/2011  . MS (multiple sclerosis) (HCC)    patinet has ADEM not MS per Patient  . Nausea    occasional per history form 08/18/11  . Neuromuscular disorder (HCC)   . Pneumonia 2010  . Sleep apnea    uses CPAP at home  . Stroke (HCC) 2000   TIA  . TIA (transient ischemic attack) 2000   denies residual  . TIA (transient ischemic attack)     PAST SURGICAL HISTORY: Past Surgical History:  Procedure Laterality Date  . ANTERIOR CERVICAL DECOMP/DISCECTOMY FUSION N/A 10/21/2016   Procedure: ANTERIOR CERVICAL DECOMPRESSION/DISCECTOMY FUSION, INTERBODY PROSTHESIS,PLATE CERVICAL FOUR- CERVICAL FIVE, CERVICAL FIVE- CERVICAL SIX;  Surgeon: Tressie Stalker, MD;  Location: [facility] OR;  Service: Neurosurgery;  Laterality: N/A;  . CHOLECYSTECTOMY  08/02/2011   Procedure: LAPAROSCOPIC CHOLECYSTECTOMY;  Surgeon: Shelly Rubenstein, MD;  Location: [facility] OR;  Service: General;;  . COLONOSCOPY  2003  . ESOPHAGOGASTRODUODENOSCOPY  2003  . ESOPHAGOGASTRODUODENOSCOPY  07/28/2011   Procedure: ESOPHAGOGASTRODUODENOSCOPY (EGD);  Surgeon: [name], MD;  Location: [facility];  Service: Endoscopy;  Laterality: N/A;  . MYRINGOTOMY  1979; 1980   bilaterally  . TONSILLECTOMY AND ADENOIDECTOMY  ~ 1980    SOCIAL HISTORY: Social History   Tobacco Use  . Smoking status: Former Smoker    Packs/day: 3.00    Years: 3.00    Pack years: 9.00    Types: Cigarettes    Last attempt to quit: 08/18/1987    Years since quitting: 30.4  . Smokeless tobacco: Never Used  Substance Use Topics  . Alcohol use: Yes    Alcohol/week: 0.0 oz    Comment: 07/04/11 "glass of wine or spirits once a month"  . Drug use: Yes    Types: Marijuana    Comment: have not used in a 1.5 years    FAMILY HISTORY: Family History  Problem Relation Age of Onset  . Coronary artery disease Father   . Stroke Father   . Cancer Father   . High blood pressure Father   . Alcoholism Father   . Diabetes Mother   . Obesity  Mother   . Cancer Maternal Grandmother        bone  . Cancer Other        Stomach -Great-great grandmother    ROS: Review of Systems  Constitutional: Positive for malaise/fatigue. Negative for weight loss.  Cardiovascular: Negative for chest pain.  Gastrointestinal: Negative for nausea and vomiting.  Musculoskeletal:       Negative for muscle weakness  Endo/Heme/Allergies:       Negative for hypoglycemia    PHYSICAL EXAM: Blood pressure 125/78, pulse 68, temperature 98.3 F (36.8 C), temperature source Oral, height 5\' 8"  (1.727 m), weight 299 lb (135.6 kg), SpO2 96 %. Body mass index is 45.46 kg/m. Physical  Exam  Constitutional: He is oriented to person, place, and time. He appears well-developed and well-nourished.  Cardiovascular: Normal rate.  Pulmonary/Chest: Effort normal.  Musculoskeletal: Normal range of motion.  Neurological: He is oriented to person, place, and time.  Skin: Skin is warm and dry.  Psychiatric: He has a normal mood and affect. His behavior is normal.  Vitals reviewed.   RECENT LABS AND TESTS: BMET    Component Value Date/Time   NA 142 09/15/2017 0902   K 3.7 09/15/2017 0902   CL 100 09/15/2017 0902   CO2 25 09/15/2017 0902   GLUCOSE 86 09/15/2017 0902   GLUCOSE 88 08/24/2017 1458   BUN 13 09/15/2017 0902   CREATININE 0.95 09/15/2017 0902   CREATININE 1.02 08/24/2017 1458   CALCIUM 9.4 09/15/2017 0902   GFRNONAA 96 09/15/2017 0902   GFRNONAA >89 07/18/2015 1656   GFRAA 111 09/15/2017 0902   GFRAA >89 07/18/2015 1656   Lab Results  Component Value Date   HGBA1C 5.7 (H) 09/15/2017   HGBA1C 5.6 08/24/2017   HGBA1C 6.5 (H) 02/18/2017   HGBA1C 5.5 09/17/2016   HGBA1C 6.3 (H) 10/15/2015   Lab Results  Component Value Date   INSULIN 110.6 (H) 09/15/2017   CBC    Component Value Date/Time   WBC 6.3 09/15/2017 0902   WBC 6.2 10/12/2016 1441   RBC 5.74 09/15/2017 0902   RBC 5.27 10/12/2016 1441   HGB 16.6 09/15/2017 0902   HCT 49.5  09/15/2017 0902   PLT 151 10/12/2016 1441   PLT 158 09/17/2016 1611   MCV 86 09/15/2017 0902   MCH 28.9 09/15/2017 0902   MCH 28.1 10/12/2016 1441   MCHC 33.5 09/15/2017 0902   MCHC 34.0 10/12/2016 1441   RDW 14.7 09/15/2017 0902   LYMPHSABS 2.2 09/15/2017 0902   MONOABS 0.5 12/05/2014 1538   EOSABS 0.1 09/15/2017 0902   BASOSABS 0.0 09/15/2017 0902   Iron/TIBC/Ferritin/ %Sat No results found for: IRON, TIBC, FERRITIN, IRONPCTSAT Lipid Panel     Component Value Date/Time   CHOL 111 09/15/2017 0902   TRIG 339 (H) 09/15/2017 0902   HDL 31 (L) 09/15/2017 0902   CHOLHDL 3.4 08/24/2017 1458   VLDL 70 (H) 10/15/2015 1112   LDLCALC 12 09/15/2017 0902   LDLCALC 57 08/24/2017 1458   Hepatic Function Panel     Component Value Date/Time   PROT 6.7 09/15/2017 0902   ALBUMIN 4.4 09/15/2017 0902   AST 24 09/15/2017 0902   ALT 60 (H) 09/15/2017 0902   ALKPHOS 58 09/15/2017 0902   BILITOT 0.5 09/15/2017 0902   BILIDIR 0.1 04/11/2015 1638   IBILI 0.6 04/11/2015 1638      Component Value Date/Time   TSH 2.080 09/15/2017 0902   TSH 3.190 09/17/2016 1611   TSH 2.339 12/05/2014 1538   Results for HURSCHEL, ISHMAEL "[name]" (MRN [id number]) as of 01/27/2018 08:35  Ref. Range 09/15/2017 09:02  Vitamin D, 25-Hydroxy Latest Ref Range: 30.0 - 100.0 ng/mL 10.9 (L)   ASSESSMENT AND PLAN: Type 2 diabetes mellitus without complication, without long-term current use of insulin (HCC) - Plan: liraglutide (VICTOZA) 18 MG/3ML SOPN, Insulin Pen [name] (BD PEN [name] NANO U/F) 32G X 4 MM MISC  Vitamin D deficiency - Plan: Vitamin D, Ergocalciferol, (DRISDOL) 50000 units CAPS capsule  At risk for heart disease  Class 3 severe obesity with serious comorbidity and body mass index (BMI) of 45.0 to 49.9 in adult, unspecified obesity type (HCC)  PLAN:  Diabetes II Brandon [name]  has been given extensive diabetes education by myself today including ideal fasting and post-prandial blood glucose readings,  individual ideal Hgb A1c goals  and hypoglycemia prevention. We discussed the importance of good blood sugar control to decrease the likelihood of diabetic complications such as nephropathy, neuropathy, limb loss, blindness, coronary artery disease, and death. We discussed the importance of intensive lifestyle modification including diet, exercise and weight loss as the first line treatment for diabetes. Darol agrees to continue Victoza and we will refill for 1 month and will refill nano needles #100. Brandon [name] agrees to follow up at the agreed upon time.  Cardiovascular risk counseling Brandon [name] was given extended (15 minutes) coronary artery disease prevention counseling today. He is 47 y.o. male and has risk factors for heart disease including obesity and diabetes. We discussed intensive lifestyle modifications today with an emphasis on specific weight loss instructions and strategies. Pt was also informed of the importance of increasing exercise and decreasing saturated fats to help prevent heart disease.  Vitamin D Deficiency Brandon [name] was informed that low vitamin D levels contributes to fatigue and are associated with obesity, breast, and colon cancer. He agrees to continue to take prescription Vit D @50 ,000 IU every week #4 with no refills and will follow up for routine testing of vitamin D, at least 2-3 times per year. He was informed of the risk of over-replacement of vitamin D and agrees to not increase his dose unless he discusses this with [place] first. Brandon [name] agrees to follow up as directed.  Obesity Brandon [name] is currently in the action stage of change. As such, his goal is to continue with weight loss efforts He has agreed to keep a food journal with 1800 to 2300 calories and 100+ grams of protein daily Brandon [name] has been instructed to work up to a goal of 150 minutes of combined cardio and strengthening exercise per week for weight loss and overall health benefits. We discussed the following  Behavioral Modification Strategies today: increasing lean protein intake, decreasing simple carbohydrates , work on meal planning and easy cooking plans, emotional eating strategies and ways to avoid boredom eating  Brandon [name] has agreed to follow up with our clinic in 2 to 3 weeks. He was informed of the importance of frequent follow up visits to maximize his success with intensive lifestyle modifications for his multiple health conditions.   OBESITY BEHAVIORAL INTERVENTION VISIT  Today's visit was # 7 out of 22.  Starting weight: 305 lbs Starting date: 09/15/17 Today's weight : 299 lbs Today's date: 01/26/2018 Total lbs lost to date: 6    ASK: We discussed the diagnosis of obesity with Brandon [name] today and Brandon [name] agreed to give [place] permission to discuss obesity behavioral modification therapy today.  ASSESS: Dashiell has the diagnosis of obesity and his BMI today is 45.47 Brandon [name] is in the action stage of change   ADVISE: Brandon [name] was educated on the multiple health risks of obesity as well as the benefit of weight loss to improve his health. He was advised of the need for long term treatment and the importance of lifestyle modifications.  AGREE: Multiple dietary modification options and treatment options were discussed and  Brandon [name] agreed to the above obesity treatment plan.  I, [place] Crane, am acting as transcriptionist for Quillian Quince, MD  I have reviewed the above documentation for accuracy and completeness, and I agree with the above. -Quillian Quince, MD

## 2018-02-16 ENCOUNTER — Ambulatory Visit (INDEPENDENT_AMBULATORY_CARE_PROVIDER_SITE_OTHER): Payer: 59 | Admitting: Family Medicine

## 2018-02-16 VITALS — BP 114/75 | HR 74 | Temp 97.8°F | Ht 68.0 in | Wt 294.0 lb

## 2018-02-16 DIAGNOSIS — E119 Type 2 diabetes mellitus without complications: Secondary | ICD-10-CM

## 2018-02-16 DIAGNOSIS — E559 Vitamin D deficiency, unspecified: Secondary | ICD-10-CM | POA: Diagnosis not present

## 2018-02-16 DIAGNOSIS — E7849 Other hyperlipidemia: Secondary | ICD-10-CM

## 2018-02-16 DIAGNOSIS — Z9189 Other specified personal risk factors, not elsewhere classified: Secondary | ICD-10-CM

## 2018-02-16 DIAGNOSIS — Z6841 Body Mass Index (BMI) 40.0 and over, adult: Secondary | ICD-10-CM

## 2018-02-16 NOTE — Progress Notes (Signed)
Office: [phone]  /  Fax: [phone]   HPI:   Chief Complaint: OBESITY Brandon [name] is here to discuss his progress with his obesity treatment plan. He is on the keep a food journal with 1800-2300 calories and 100 grams of protein daily and is following his eating plan approximately 25 % of the time. He states he is walking for 20-25 minutes 2 times per week. Brandon [name] is journaling most days and calories are slightly above goal but are closer to goal. Working on meal prep and planning with family.  His weight is 294 lb (133.4 kg) today and has had a weight loss of 5 pounds over a period of 3 weeks since his last visit. He has lost 11 lbs since starting treatment with [place].  Diabetes II Brandon [name] has a diagnosis of diabetes type II. Brandon [name] denies hypoglycemic feelings, and he is not checking BGs at home. Last A1c was 5.7. He has been working on intensive lifestyle modifications including diet, exercise, and weight loss to help control his blood glucose levels.  Hyperlipidemia Brandon [name] has hyperlipidemia and has been attempting to improve his cholesterol levels with intensive lifestyle modification including a low saturated fat diet, exercise and weight loss. He denies any chest pain, claudication or myalgias. He is due for labs.  At risk for cardiovascular disease Brandon [name] is at a higher than average risk for cardiovascular disease due to obesity, diabetes II, and hyperlipidemia. He currently denies any chest pain.  Vitamin D Deficiency Brandon [name] has a diagnosis of vitamin D deficiency. He is on prescription Vit D, but still missing doses. He still notes fatigue and denies nausea, vomiting or muscle weakness.  ALLERGIES: Allergies  Allergen Reactions  . Penicillin G Anaphylaxis    Gets extremely high fevers.  . Sulfa Antibiotics Other (See Comments)    It will kill him  . Amoxicillin   . Cephalosporins Other (See Comments)    Reaction unspecified.  . Quinolones Other (See Comments)   Reaction unspecified  . Sulfamethoxazole Swelling    MEDICATIONS: Current Outpatient Medications on File Prior to Visit  Medication Sig Dispense Refill  . atorvastatin (LIPITOR) 20 MG tablet Take 1 tablet (20 mg total) by mouth at bedtime. (0000) 90 tablet 1  . azelastine (ASTELIN) 0.1 % nasal spray USE 2 SPRAYS IN EACH NOSTRIL TWICE A DAY 30 mL 1  . benzonatate (TESSALON) 100 MG capsule Take 1-2 capsules (100-200 mg total) by mouth 3 (three) times daily as needed for cough. 40 capsule 0  . colesevelam (WELCHOL) 625 MG tablet Take 3 tablets (1,875 mg total) by mouth daily with breakfast. 540 tablet 1  . diclofenac (VOLTAREN) 75 MG EC tablet Take 1 tablet (75 mg total) by mouth 2 (two) times daily. 180 tablet 1  . gabapentin (NEURONTIN) 300 MG capsule Take 3 capsules (900 mg total) by mouth 2 (two) times daily. 540 capsule 1  . Guaifenesin (MUCINEX MAXIMUM STRENGTH) 1200 MG TB12 Take 1 tablet (1,200 mg total) by mouth every 12 (twelve) hours as needed. 14 tablet 1  . Insulin Pen Needle (BD PEN NEEDLE NANO U/F) 32G X 4 MM MISC 1 Package by Does not apply route 2 (two) times daily. 100 each 0  . liraglutide (VICTOZA) 18 MG/3ML SOPN Inject 0.1 mLs (0.6 mg total) into the skin every morning. 1 pen 0  . lisinopril-hydrochlorothiazide (PRINZIDE,ZESTORETIC) 10-12.5 MG tablet Take 1 tablet by mouth at bedtime. 0000 90 tablet 1  . metFORMIN (GLUCOPHAGE-XR) 500 MG 24 hr tablet TAKE 2 TABLETS (  1,000 MG TOTAL) BY MOUTH 2 (TWO) TIMES DAILY. 360 tablet 1  . NON FORMULARY CPAP machine at bedtime & sleep    . tiZANidine (ZANAFLEX) 4 MG capsule Take 1 capsule (4 mg total) by mouth 3 (three) times daily. 270 capsule 1  . Vitamin D, Ergocalciferol, (DRISDOL) 50000 units CAPS capsule Take 1 capsule (50,000 Units total) by mouth every 7 (seven) days. 4 capsule 0   No current facility-administered medications on file prior to visit.     PAST MEDICAL HISTORY: Past Medical History:  Diagnosis Date  . ADEM (acute  disseminated encephalomyelitis)   . Allergy   . Anxiety 2005  . Complication of anesthesia   . Depression 2005  . Difficult intubation    difficult intubating and exbutating "throat Clinched around tube"  . Headache(784.0)   . Hearing loss   . Hyperinsulinemia 07/04/11   "I'm on metformin cause I produce too much insulin"  . Hyperlipidemia   . Hypertension   . Mononucleosis, infectious, with hepatitis 06/2011  . MS (multiple sclerosis) (HCC)    patinet has ADEM not MS per Patient  . Nausea    occasional per history form 08/18/11  . Neuromuscular disorder (HCC)   . Pneumonia 2010  . Sleep apnea    uses CPAP at home  . Stroke (HCC) 2000   TIA  . TIA (transient ischemic attack) 2000   denies residual  . TIA (transient ischemic attack)     PAST SURGICAL HISTORY: Past Surgical History:  Procedure Laterality Date  . ANTERIOR CERVICAL DECOMP/DISCECTOMY FUSION N/A 10/21/2016   Procedure: ANTERIOR CERVICAL DECOMPRESSION/DISCECTOMY FUSION, INTERBODY PROSTHESIS,PLATE CERVICAL FOUR- CERVICAL FIVE, CERVICAL FIVE- CERVICAL SIX;  Surgeon: Tressie Stalker, MD;  Location: [facility] OR;  Service: Neurosurgery;  Laterality: N/A;  . CHOLECYSTECTOMY  08/02/2011   Procedure: LAPAROSCOPIC CHOLECYSTECTOMY;  Surgeon: Shelly Rubenstein, MD;  Location: [facility] OR;  Service: General;;  . COLONOSCOPY  2003  . ESOPHAGOGASTRODUODENOSCOPY  2003  . ESOPHAGOGASTRODUODENOSCOPY  07/28/2011   Procedure: ESOPHAGOGASTRODUODENOSCOPY (EGD);  Surgeon: [name], MD;  Location: [facility] ENDOSCOPY;  Service: Endoscopy;  Laterality: N/A;  . MYRINGOTOMY  1979; 1980   bilaterally  . TONSILLECTOMY AND ADENOIDECTOMY  ~ 1980    SOCIAL HISTORY: Social History   Tobacco Use  . Smoking status: Former Smoker    Packs/day: 3.00    Years: 3.00    Pack years: 9.00    Types: Cigarettes    Last attempt to quit: 08/18/1987    Years since quitting: 30.5  . Smokeless tobacco: Never Used  Substance Use Topics  . Alcohol use: Yes     Alcohol/week: 0.0 standard drinks    Comment: 07/04/11 "glass of wine or spirits once a month"  . Drug use: Yes    Types: Marijuana    Comment: have not used in a 1.5 years    FAMILY HISTORY: Family History  Problem Relation Age of Onset  . Coronary artery disease Father   . Stroke Father   . Cancer Father   . High blood pressure Father   . Alcoholism Father   . Diabetes Mother   . Obesity Mother   . Cancer Maternal Grandmother        bone  . Cancer Other        Stomach -Great-great grandmother    ROS: Review of Systems  Constitutional: Positive for malaise/fatigue and weight loss.  Cardiovascular: Negative for chest pain and claudication.  Gastrointestinal: Negative for nausea and vomiting.  Musculoskeletal: Negative for  myalgias.       Negative muscle weakness  Endo/Heme/Allergies:       Negative hypoglycemia    PHYSICAL EXAM: Blood pressure 114/75, pulse 74, temperature 97.8 F (36.6 C), temperature source Oral, height 5\' 8"  (1.727 m), weight 294 lb (133.4 kg), SpO2 96 %. Body mass index is 44.7 kg/m. Physical Exam  Constitutional: He is oriented to person, place, and time. He appears well-developed and well-nourished.  Cardiovascular: Normal rate.  Pulmonary/Chest: Effort normal.  Musculoskeletal: Normal range of motion.  Neurological: He is oriented to person, place, and time.  Skin: Skin is warm and dry.  Psychiatric: He has a normal mood and affect. His behavior is normal.  Vitals reviewed.   RECENT LABS AND TESTS: BMET    Component Value Date/Time   NA 142 09/15/2017 0902   K 3.7 09/15/2017 0902   CL 100 09/15/2017 0902   CO2 25 09/15/2017 0902   GLUCOSE 86 09/15/2017 0902   GLUCOSE 88 08/24/2017 1458   BUN 13 09/15/2017 0902   CREATININE 0.95 09/15/2017 0902   CREATININE 1.02 08/24/2017 1458   CALCIUM 9.4 09/15/2017 0902   GFRNONAA 96 09/15/2017 0902   GFRNONAA >89 07/18/2015 1656   GFRAA 111 09/15/2017 0902   GFRAA >89 07/18/2015 1656    Lab Results  Component Value Date   HGBA1C 5.7 (H) 09/15/2017   HGBA1C 5.6 08/24/2017   HGBA1C 6.5 (H) 02/18/2017   HGBA1C 5.5 09/17/2016   HGBA1C 6.3 (H) 10/15/2015   Lab Results  Component Value Date   INSULIN 110.6 (H) 09/15/2017   CBC    Component Value Date/Time   WBC 6.3 09/15/2017 0902   WBC 6.2 10/12/2016 1441   RBC 5.74 09/15/2017 0902   RBC 5.27 10/12/2016 1441   HGB 16.6 09/15/2017 0902   HCT 49.5 09/15/2017 0902   PLT 151 10/12/2016 1441   PLT 158 09/17/2016 1611   MCV 86 09/15/2017 0902   MCH 28.9 09/15/2017 0902   MCH 28.1 10/12/2016 1441   MCHC 33.5 09/15/2017 0902   MCHC 34.0 10/12/2016 1441   RDW 14.7 09/15/2017 0902   LYMPHSABS 2.2 09/15/2017 0902   MONOABS 0.5 12/05/2014 1538   EOSABS 0.1 09/15/2017 0902   BASOSABS 0.0 09/15/2017 0902   Iron/TIBC/Ferritin/ %Sat No results found for: IRON, TIBC, FERRITIN, IRONPCTSAT Lipid Panel     Component Value Date/Time   CHOL 111 09/15/2017 0902   TRIG 339 (H) 09/15/2017 0902   HDL 31 (L) 09/15/2017 0902   CHOLHDL 3.4 08/24/2017 1458   VLDL 70 (H) 10/15/2015 1112   LDLCALC 12 09/15/2017 0902   LDLCALC 57 08/24/2017 1458   Hepatic Function Panel     Component Value Date/Time   PROT 6.7 09/15/2017 0902   ALBUMIN 4.4 09/15/2017 0902   AST 24 09/15/2017 0902   ALT 60 (H) 09/15/2017 0902   ALKPHOS 58 09/15/2017 0902   BILITOT 0.5 09/15/2017 0902   BILIDIR 0.1 04/11/2015 1638   IBILI 0.6 04/11/2015 1638      Component Value Date/Time   TSH 2.080 09/15/2017 0902   TSH 3.190 09/17/2016 1611   TSH 2.339 12/05/2014 1538  Results for TAKUYA, LARICCIA "[name]" (MRN [id number]) as of 02/16/2018 11:15  Ref. Range 09/15/2017 09:02  Vitamin D, 25-Hydroxy Latest Ref Range: 30.0 - 100.0 ng/mL 10.9 (L)    ASSESSMENT AND PLAN: Type 2 diabetes mellitus without complication, without long-term current use of insulin (HCC) - Plan: Comprehensive metabolic panel, Hemoglobin A1c, Insulin, random  Other  hyperlipidemia - Plan: Lipid Panel With LDL/HDL Ratio  Vitamin D deficiency - Plan: VITAMIN D 25 Hydroxy (Vit-D Deficiency, Fractures)  At risk for heart disease  Class 3 severe obesity with serious comorbidity and body mass index (BMI) of 40.0 to 44.9 in adult, unspecified obesity type (HCC)  PLAN:  Diabetes II Brandon [name] has been given extensive diabetes education by myself today including ideal fasting and post-prandial blood glucose readings, individual ideal Hgb A1c goals and hypoglycemia prevention. We discussed the importance of good blood sugar control to decrease the likelihood of diabetic complications such as nephropathy, neuropathy, limb loss, blindness, coronary artery disease, and death. We discussed the importance of intensive lifestyle modification including diet, exercise and weight loss as the first line treatment for diabetes. Brandon [name] agrees to increase Victoza to 0.9 mg (5 clicks past) and continue metformin. We will check labs and Brandon [name] agrees to follow up with our clinic in 3 weeks.  Hyperlipidemia Brandon [name] was informed of the American Heart Association Guidelines emphasizing intensive lifestyle modifications as the first line treatment for hyperlipidemia. We discussed many lifestyle modifications today in depth, and Brandon [name] will continue to work on decreasing saturated fats such as fatty red meat, butter and many fried foods. He will also increase vegetables and lean protein in his diet and continue to work on diet, exercise, and weight loss efforts. We will check labs and Brandon [name] agrees to follow up with our clinic in 3 weeks.  Cardiovascular risk counselling Brandon [name] was given extended (15 minutes) coronary artery disease prevention counseling today. He is 47 y.o. male and has risk factors for heart disease including obesity, diabetes II, and hyperlipidemia. We discussed intensive lifestyle modifications today with an emphasis on specific weight loss instructions and  strategies. Pt was also informed of the importance of increasing exercise and decreasing saturated fats to help prevent heart disease.  Vitamin D Deficiency [place] was informed that low vitamin D levels contributes to fatigue and are associated with obesity, breast, and colon cancer. Brandon [name] agrees to continue taking prescription Vit D @50 ,000 IU every week and will follow up for routine testing of vitamin D, at least 2-3 times per year. He was informed of the risk of over-replacement of vitamin D and agrees to not increase his dose unless he discusses this with [place] first. We will check labs and Brandon [name] agrees to follow up with our clinic in 3 weeks.  Obesity Brandon [name] is currently in the action stage of change. As such, his goal is to continue with weight loss efforts He has agreed to keep a food journal with 1800-2300 calories and 100 grams of protein daily Brandon [name] has been instructed to work up to a goal of 150 minutes of combined cardio and strengthening exercise per week for weight loss and overall health benefits. We discussed the following Behavioral Modification Strategies today: work on meal planning and easy cooking plans and dealing with family or coworker sabotage   Brandon [name] has agreed to follow up with our clinic in 3 weeks. He was informed of the importance of frequent follow up visits to maximize his success with intensive lifestyle modifications for his multiple health conditions.   OBESITY BEHAVIORAL INTERVENTION VISIT  Today's visit was # 8 out of 22.  Starting weight: 305 lbs Starting date: 09/15/17 Today's weight : 294 lbs  Today's date: 02/16/2018 Total lbs lost to date: 11    ASK: We discussed the diagnosis of obesity with Dahlia Byes today and [name] agreed to give [place] permission to  discuss obesity behavioral modification therapy today.  ASSESS: Thaddeus has the diagnosis of obesity and his BMI today is 44.71 Azhar is in the action stage of change    ADVISE: Vann was educated on the multiple health risks of obesity as well as the benefit of weight loss to improve his health. He was advised of the need for long term treatment and the importance of lifestyle modifications.  AGREE: Multiple dietary modification options and treatment options were discussed and  Kimber agreed to the above obesity treatment plan.  I, Burt Knack, am acting as transcriptionist for Quillian Quince, MD  I have reviewed the above documentation for accuracy and completeness, and I agree with the above. -Quillian Quince, MD

## 2018-02-17 ENCOUNTER — Emergency Department (HOSPITAL_COMMUNITY): Payer: 59

## 2018-02-17 ENCOUNTER — Encounter (HOSPITAL_COMMUNITY): Payer: Self-pay | Admitting: Emergency Medicine

## 2018-02-17 ENCOUNTER — Encounter: Payer: Self-pay | Admitting: Emergency Medicine

## 2018-02-17 ENCOUNTER — Other Ambulatory Visit: Payer: Self-pay

## 2018-02-17 ENCOUNTER — Emergency Department (HOSPITAL_COMMUNITY)
Admission: EM | Admit: 2018-02-17 | Discharge: 2018-02-17 | Disposition: A | Discharge status: Home / Self Care | Payer: 59 | Attending: Emergency Medicine | Admitting: Emergency Medicine

## 2018-02-17 ENCOUNTER — Ambulatory Visit: Payer: 59 | Admitting: Emergency Medicine

## 2018-02-17 VITALS — BP 139/89 | HR 82 | Temp 97.9°F | Ht 68.0 in | Wt 299.8 lb

## 2018-02-17 DIAGNOSIS — M542 Cervicalgia: Secondary | ICD-10-CM | POA: Diagnosis not present

## 2018-02-17 DIAGNOSIS — M5412 Radiculopathy, cervical region: Secondary | ICD-10-CM

## 2018-02-17 DIAGNOSIS — Z79899 Other long term (current) drug therapy: Secondary | ICD-10-CM | POA: Insufficient documentation

## 2018-02-17 DIAGNOSIS — M5000 Cervical disc disorder with myelopathy, unspecified cervical region: Secondary | ICD-10-CM | POA: Diagnosis not present

## 2018-02-17 DIAGNOSIS — E119 Type 2 diabetes mellitus without complications: Secondary | ICD-10-CM | POA: Insufficient documentation

## 2018-02-17 DIAGNOSIS — M5030 Other cervical disc degeneration, unspecified cervical region: Secondary | ICD-10-CM | POA: Diagnosis not present

## 2018-02-17 DIAGNOSIS — M5010 Cervical disc disorder with radiculopathy, unspecified cervical region: Secondary | ICD-10-CM | POA: Diagnosis not present

## 2018-02-17 DIAGNOSIS — I10 Essential (primary) hypertension: Secondary | ICD-10-CM | POA: Insufficient documentation

## 2018-02-17 DIAGNOSIS — G8929 Other chronic pain: Secondary | ICD-10-CM | POA: Insufficient documentation

## 2018-02-17 DIAGNOSIS — Z87891 Personal history of nicotine dependence: Secondary | ICD-10-CM | POA: Diagnosis not present

## 2018-02-17 DIAGNOSIS — Z794 Long term (current) use of insulin: Secondary | ICD-10-CM | POA: Diagnosis not present

## 2018-02-17 LAB — INSULIN, RANDOM: INSULIN: 60.7 u[IU]/mL — ABNORMAL HIGH (ref 2.6–24.9)

## 2018-02-17 LAB — COMPREHENSIVE METABOLIC PANEL
ALT: 59 IU/L — ABNORMAL HIGH (ref 0–44)
AST: 26 IU/L (ref 0–40)
Albumin/Globulin Ratio: 1.9 (ref 1.2–2.2)
Albumin: 4.4 g/dL (ref 3.5–5.5)
Alkaline Phosphatase: 62 IU/L (ref 39–117)
BUN/Creatinine Ratio: 13 (ref 9–20)
BUN: 14 mg/dL (ref 6–24)
Bilirubin Total: 0.4 mg/dL (ref 0.0–1.2)
CO2: 24 mmol/L (ref 20–29)
Calcium: 9.4 mg/dL (ref 8.7–10.2)
Chloride: 104 mmol/L (ref 96–106)
Creatinine, Ser: 1.1 mg/dL (ref 0.76–1.27)
GFR calc Af Amer: 93 mL/min/{1.73_m2} (ref >59)
GFR calc non Af Amer: 80 mL/min/{1.73_m2} (ref >59)
Globulin, Total: 2.3 g/dL (ref 1.5–4.5)
Glucose: 84 mg/dL (ref 65–99)
Potassium: 3.9 mmol/L (ref 3.5–5.2)
Sodium: 143 mmol/L (ref 134–144)
Total Protein: 6.7 g/dL (ref 6.0–8.5)

## 2018-02-17 LAB — CBG MONITORING, ED: Glucose-Capillary: 113 mg/dL — ABNORMAL HIGH (ref 70–99)

## 2018-02-17 LAB — HEMOGLOBIN A1C
Est. average glucose Bld gHb Est-mCnc: 111 mg/dL
Hgb A1c MFr Bld: 5.5 % (ref 4.8–5.6)

## 2018-02-17 LAB — VITAMIN D 25 HYDROXY (VIT D DEFICIENCY, FRACTURES): Vit D, 25-Hydroxy: 28.6 ng/mL — ABNORMAL LOW (ref 30.0–100.0)

## 2018-02-17 LAB — LIPID PANEL WITH LDL/HDL RATIO
Cholesterol, Total: 116 mg/dL (ref 100–199)
HDL: 27 mg/dL — ABNORMAL LOW (ref >39)
LDL Calculated: 21 mg/dL (ref 0–99)
LDl/HDL Ratio: 0.8 ratio (ref 0.0–3.6)
Triglycerides: 340 mg/dL — ABNORMAL HIGH (ref 0–149)
VLDL Cholesterol Cal: 68 mg/dL — ABNORMAL HIGH (ref 5–40)

## 2018-02-17 MED ORDER — DIAZEPAM 5 MG PO TABS: 5.0000 mg | ORAL_TABLET | Freq: Three times a day (TID) | ORAL | 0 refills | Status: DC | PRN

## 2018-02-17 MED ORDER — KETOROLAC TROMETHAMINE 60 MG/2ML IM SOLN
60.0000 mg | Freq: Once | INTRAMUSCULAR | Status: AC
  Administered 2018-02-17: 60 mg via INTRAMUSCULAR

## 2018-02-17 MED ORDER — FENTANYL CITRATE (PF) 100 MCG/2ML IJ SOLN
100.0000 ug | Freq: Once | INTRAMUSCULAR | Status: AC
  Administered 2018-02-17: 100 ug via INTRAVENOUS
  Filled 2018-02-17: qty 2

## 2018-02-17 MED ORDER — OXYCODONE-ACETAMINOPHEN 5-325 MG PO TABS: 1.0000 | ORAL_TABLET | ORAL | 0 refills | Status: DC | PRN

## 2018-02-17 MED ORDER — DIAZEPAM 5 MG PO TABS
5.0000 mg | ORAL_TABLET | Freq: Once | ORAL | Status: AC
  Administered 2018-02-17: 5 mg via ORAL
  Filled 2018-02-17: qty 1

## 2018-02-17 NOTE — ED Notes (Signed)
Pt returned from MRI °

## 2018-02-17 NOTE — Progress Notes (Signed)
Attempted patient for MRI  Due to patients body habitus, he will not clear bore of scanner Spoke to [employer]

## 2018-02-17 NOTE — ED Triage Notes (Signed)
Pt came from Urgent Care who told him to go to ED. Yesterday afternoon pt started having sharp pain in neck, which extends down the left arm. Pt has a history of disc ruptures. Urgent Care told him he need an MRI.

## 2018-02-17 NOTE — ED Notes (Signed)
Pt unable to get MRI due to pt size, was unable to fit in scanner. Will need to go to [facility] or outpatient

## 2018-02-17 NOTE — ED Provider Notes (Signed)
[facility]-EMERGENCY DEPT Provider Note   CSN: [id number] Arrival date & time: 02/17/18  0901     History   Chief Complaint Chief Complaint  Patient presents with  . Neck Pain    left side    HPI Brandon [name] is a 47 y.o. male.  HPI Patient presents to the emergency department with neck pain that radiates into his left arm.  Patient has a neurologic condition which causes him to have issues with weakness in that extremity to begin with.  The patient states that he has had neck related issues in the past and this feels very similar.  The patient had surgery on his neck for bulging disc.  The patient states that he does have some increased weakness from his baseline which is normal weakness for that extremity.  The patient states he has decreased range of motion as well.  The patient denies chest pain, shortness of breath, headache,blurred vision, neck pain, fever, cough, weakness, numbness, dizziness, anorexia, edema, abdominal pain, nausea, vomiting, diarrhea, rash, back pain, dysuria, hematemesis, bloody stool, near syncope, or syncope. Past Medical History:  Diagnosis Date  . ADEM (acute disseminated encephalomyelitis)   . Allergy   . Anxiety 2005  . Complication of anesthesia   . Depression 2005  . Difficult intubation    difficult intubating and exbutating "throat Clinched around tube"  . Headache(784.0)   . Hearing loss   . Hyperinsulinemia 07/04/11   "I'm on metformin cause I produce too much insulin"  . Hyperlipidemia   . Hypertension   . Mononucleosis, infectious, with hepatitis 06/2011  . MS (multiple sclerosis) (HCC)    patinet has ADEM not MS per Patient  . Nausea    occasional per history form 08/18/11  . Neuromuscular disorder (HCC)   . Pneumonia 2010  . Sleep apnea    uses CPAP at home  . Stroke (HCC) 2000   TIA  . TIA (transient ischemic attack) 2000   denies residual  . TIA (transient ischemic attack)     Patient Active  Problem List   Diagnosis Date Noted  . Chronic neck pain 02/17/2018  . Other fatigue 09/15/2017  . Shortness of breath on exertion 09/15/2017  . Type 2 diabetes mellitus without complication, without long-term current use of insulin (HCC) 09/15/2017  . Other hyperlipidemia 09/15/2017  . Essential hypertension 09/15/2017  . DDD (degenerative disc disease), cervical 05/03/2017  . Cervical herniated disc 10/21/2016  . Fatty liver 09/22/2016  . Spinal stenosis in cervical region 08/05/2016  . Diabetes type 2, controlled (HCC) 10/15/2015  . Hypertriglyceridemia 10/15/2015  . Benign essential HTN 12/05/2014  . Macroglossia 09/05/2014  . GAD (generalized anxiety disorder) 12/11/2013  . Motor tic disorder 12/11/2013  . OSA on CPAP 10/04/2013  . Hypersomnia with sleep apnea, unspecified 10/04/2013  . High frequency hearing loss 08/24/2013  . Peripheral neuropathy 04/19/2013  . Abnormal gait 10/19/2012  . Cervical myelopathy (HCC) 10/19/2012  . Esophageal reflux 07/27/2011  . Morbid obesity due to excess calories (HCC) 07/22/2011  . ADD (attention deficit disorder) 07/22/2011  . ADEM (acute disseminated encephalomyelitis) 07/03/2011  . Splenomegaly 07/03/2011  . Elevated LFTs 07/03/2011    Past Surgical History:  Procedure Laterality Date  . ANTERIOR CERVICAL DECOMP/DISCECTOMY FUSION N/A 10/21/2016   Procedure: ANTERIOR CERVICAL DECOMPRESSION/DISCECTOMY FUSION, INTERBODY PROSTHESIS,PLATE CERVICAL FOUR- CERVICAL FIVE, CERVICAL FIVE- CERVICAL SIX;  Surgeon: Tressie Stalker, MD;  Location: [facility] OR;  Service: Neurosurgery;  Laterality: N/A;  . CHOLECYSTECTOMY  08/02/2011  Procedure: LAPAROSCOPIC CHOLECYSTECTOMY;  Surgeon: Shelly Rubenstein, MD;  Location: [facility] OR;  Service: General;;  . COLONOSCOPY  2003  . ESOPHAGOGASTRODUODENOSCOPY  2003  . ESOPHAGOGASTRODUODENOSCOPY  07/28/2011   Procedure: ESOPHAGOGASTRODUODENOSCOPY (EGD);  Surgeon: [name], MD;  Location: [facility] ENDOSCOPY;  Service:  Endoscopy;  Laterality: N/A;  . MYRINGOTOMY  1979; 1980   bilaterally  . TONSILLECTOMY AND ADENOIDECTOMY  ~ 1980        Home Medications    Prior to Admission medications   Medication Sig Start Date End Date Taking? Authorizing Provider  atorvastatin (LIPITOR) 20 MG tablet Take 1 tablet (20 mg total) by mouth at bedtime. (0000) 08/24/17  Yes Weber, Dema Severin, PA-C  colesevelam [facility]) 625 MG tablet Take 3 tablets (1,875 mg total) by mouth daily with breakfast. 08/24/17  Yes Weber, [name] L, PA-C  cyclobenzaprine (FLEXERIL) 10 MG tablet TAKE 1 TABLET BY ORAL ROUTE 3 TIMES EVERY DAY AS NEEDED FOR MUSCLE SPASMS 02/08/18  Yes [provider]  diclofenac (VOLTAREN) 75 MG EC tablet Take 1 tablet (75 mg total) by mouth 2 (two) times daily. 08/24/17  Yes Weber, [name] L, PA-C  gabapentin (NEURONTIN) 300 MG capsule Take 3 capsules (900 mg total) by mouth 2 (two) times daily. 08/24/17  Yes Weber, [name] L, PA-C  Insulin Pen Needle (BD PEN NEEDLE NANO U/F) 32G X 4 MM MISC 1 Package by Does not apply route 2 (two) times daily. 01/27/18  Yes Beasley, Caren D, MD  liraglutide (VICTOZA) 18 MG/3ML SOPN Inject 0.1 mLs (0.6 mg total) into the skin every morning. 01/27/18  Yes Beasley, Caren D, MD  lisinopril-hydrochlorothiazide (PRINZIDE,ZESTORETIC) 10-12.5 MG tablet Take 1 tablet by mouth at bedtime. 0000 08/24/17  Yes Weber, [name] L, PA-C  metFORMIN (GLUCOPHAGE-XR) 500 MG 24 hr tablet TAKE 2 TABLETS (1,000 MG TOTAL) BY MOUTH 2 (TWO) TIMES DAILY. 08/24/17  Yes Weber, [name] L, PA-C  NON FORMULARY CPAP machine at bedtime & sleep   Yes [provider]  tiZANidine (ZANAFLEX) 4 MG capsule Take 1 capsule (4 mg total) by mouth 3 (three) times daily. 11/19/17  Yes Weber, Dema Severin, PA-C  Vitamin D, Ergocalciferol, (DRISDOL) 50000 units CAPS capsule Take 1 capsule (50,000 Units total) by mouth every 7 (seven) days. 01/27/18  Yes Beasley, Caren D, MD  azelastine (ASTELIN) 0.1 % nasal spray USE 2 SPRAYS IN EACH  NOSTRIL TWICE A DAY 01/07/18   Weber, Dema Severin, PA-C  benzonatate (TESSALON) 100 MG capsule Take 1-2 capsules (100-200 mg total) by mouth 3 (three) times daily as needed for cough. 10/27/17   Porfirio Oar, PA-C  Guaifenesin (MUCINEX MAXIMUM STRENGTH) 1200 MG TB12 Take 1 tablet (1,200 mg total) by mouth every 12 (twelve) hours as needed. 10/27/17   Porfirio Oar, PA-C    Family History Family History  Problem Relation Age of Onset  . Coronary artery disease Father   . Stroke Father   . Cancer Father   . High blood pressure Father   . Alcoholism Father   . Diabetes Mother   . Obesity Mother   . Cancer Maternal Grandmother        bone  . Cancer Other        Stomach -Great-great grandmother    Social History Social History   Tobacco Use  . Smoking status: Former Smoker    Packs/day: 3.00    Years: 3.00    Pack years: 9.00    Types: Cigarettes    Last attempt to quit: 08/18/1987  Years since quitting: 30.5  . Smokeless tobacco: Never Used  Substance Use Topics  . Alcohol use: Yes    Alcohol/week: 0.0 standard drinks    Comment: 07/04/11 "glass of wine or spirits once a month"  . Drug use: Yes    Types: Marijuana    Comment: have not used in a 1.5 years     Allergies   Penicillin g; Sulfa antibiotics; Amoxicillin; Cephalosporins; Quinolones; and Sulfamethoxazole   Review of Systems Review of Systems All other systems negative except as documented in the HPI. All pertinent positives and negatives as reviewed in the HPI.  Physical Exam Updated Vital Signs BP 138/89   Pulse 70   Temp 97.9 F (36.6 C) (Oral)   Resp 16   Ht 5\' 8"  (1.727 m)   Wt 134.7 kg   SpO2 99%   BMI 45.16 kg/m   Physical Exam  Constitutional: He is oriented to person, place, and time. He appears well-developed and well-nourished. No distress.  HENT:  Head: Normocephalic and atraumatic.  Mouth/Throat: Oropharynx is clear and moist.  Eyes: Pupils are equal, round, and reactive to light.    Neck: Normal range of motion. Neck supple.  Cardiovascular: Normal rate, regular rhythm and normal heart sounds. Exam reveals no gallop and no friction rub.  No murmur heard. Pulmonary/Chest: Effort normal and breath sounds normal. No respiratory distress. He has no wheezes.  Abdominal: Soft. Bowel sounds are normal. He exhibits no distension. There is no tenderness.  Neurological: He is alert and oriented to person, place, and time. No sensory deficit. He exhibits normal muscle tone. Coordination normal.  Patient does have some strength deficit and range of motion decrease in the left upper extremity.  The patient has a baseline of decreased grip strength on the left.  Patient states that it does seem a little bit worse but it is hard to differentiate from his baseline.  Skin: Skin is warm and dry. Capillary refill takes less than 2 seconds. No rash noted. No erythema.  Psychiatric: He has a normal mood and affect. His behavior is normal.  Nursing note and vitals reviewed.    ED Treatments / Results  Labs (all labs ordered are listed, but only abnormal results are displayed) Labs Reviewed  CBG MONITORING, ED - Abnormal; Notable for the following components:      Result Value   Glucose-Capillary 113 (*)    All other components within normal limits    EKG None  Radiology No results found.  Procedures Procedures (including critical care time)  Medications Ordered in ED Medications  fentaNYL (SUBLIMAZE) injection 100 mcg (100 mcg Intravenous Given 02/17/18 1000)  diazepam (VALIUM) tablet 5 mg (5 mg Oral Given 02/17/18 1000)     Initial Impression / Assessment and Plan / ED Course  I have reviewed the triage vital signs and the nursing notes.  Pertinent labs & imaging results that were available during my care of the patient were reviewed by me and considered in my medical decision making (see chart for details).     Patient denies any significant neurological deficits noted  at this time.  But there is an neurological impairment chronically for the patient.  I feel that the patient will need MRI as an outpatient since he is unable to get one here in the department at [facility] long.  Patient will be discharged and set up for an outpatient study.  Patient has no reflex deficits.  Will have the patient follow-up with his primary  doctor as well.  He does have a neurosurgeon and I will have him follow-up with him as well.  Final Clinical Impressions(s) / ED Diagnoses   Final diagnoses:  None    ED Discharge Orders    None       Charlestine Night, PA-C 02/18/18 1559    Mesner, Barbara Cower, MD 02/23/18 [phone]

## 2018-02-17 NOTE — Progress Notes (Signed)
Brandon [name] 47 y.o.   Chief Complaint  Patient presents with  . Pain    having shooting pain in the back of neck radiating down the left arm. When turning his neck has lots of pain going down spine. Onset yesterday at noon. Taking muscle relaxer for other reasons with no resolve for current pain    HISTORY OF PRESENT ILLNESS: This is a 47 y.o. male with history of cervical spine disease status post surgery in 2018 and chronic neck pain well-controlled with muscle relaxants, anti-inflammatories, and gabapentin.  However yesterday at noon developed acute onset of left-sided neck pain with radiation down the left arm.  Pain is sharp and constant and now affecting function of the left arm.  Pain worse with movement.  HPI   Prior to Admission medications   Medication Sig Start Date End Date Taking? Authorizing Provider  atorvastatin (LIPITOR) 20 MG tablet Take 1 tablet (20 mg total) by mouth at bedtime. (0000) 08/24/17  Yes Weber, [name] L, PA-C  azelastine (ASTELIN) 0.1 % nasal spray USE 2 SPRAYS IN EACH NOSTRIL TWICE A DAY 01/07/18  Yes Weber, [name] L, PA-C  benzonatate (TESSALON) 100 MG capsule Take 1-2 capsules (100-200 mg total) by mouth 3 (three) times daily as needed for cough. 10/27/17  Yes Porfirio Oar, PA-C  colesevelam (WELCHOL) 625 MG tablet Take 3 tablets (1,875 mg total) by mouth daily with breakfast. 08/24/17  Yes Weber, Dema Severin, PA-C  diclofenac (VOLTAREN) 75 MG EC tablet Take 1 tablet (75 mg total) by mouth 2 (two) times daily. 08/24/17  Yes Weber, [name] L, PA-C  gabapentin (NEURONTIN) 300 MG capsule Take 3 capsules (900 mg total) by mouth 2 (two) times daily. 08/24/17  Yes Weber, [name] L, PA-C  Guaifenesin (MUCINEX MAXIMUM STRENGTH) 1200 MG TB12 Take 1 tablet (1,200 mg total) by mouth every 12 (twelve) hours as needed. 10/27/17  Yes Jeffery, Chelle, PA-C  Insulin Pen Needle (BD PEN NEEDLE NANO U/F) 32G X 4 MM MISC 1 Package by Does not apply route 2 (two) times daily. 01/27/18  Yes  Beasley, Caren D, MD  liraglutide (VICTOZA) 18 MG/3ML SOPN Inject 0.1 mLs (0.6 mg total) into the skin every morning. 01/27/18  Yes Beasley, Caren D, MD  lisinopril-hydrochlorothiazide (PRINZIDE,ZESTORETIC) 10-12.5 MG tablet Take 1 tablet by mouth at bedtime. 0000 08/24/17  Yes Weber, [name] L, PA-C  metFORMIN (GLUCOPHAGE-XR) 500 MG 24 hr tablet TAKE 2 TABLETS (1,000 MG TOTAL) BY MOUTH 2 (TWO) TIMES DAILY. 08/24/17  Yes Weber, [name] L, PA-C  NON FORMULARY CPAP machine at bedtime & sleep   Yes [provider]  Vitamin D, Ergocalciferol, (DRISDOL) 50000 units CAPS capsule Take 1 capsule (50,000 Units total) by mouth every 7 (seven) days. 01/27/18  Yes Beasley, Caren D, MD  cyclobenzaprine (FLEXERIL) 10 MG tablet TAKE 1 TABLET BY ORAL ROUTE 3 TIMES EVERY DAY AS NEEDED FOR MUSCLE SPASMS 02/08/18   [provider]  tiZANidine (ZANAFLEX) 4 MG capsule Take 1 capsule (4 mg total) by mouth 3 (three) times daily. Patient not taking: Reported on 02/17/2018 11/19/17   Morrell Riddle, PA-C    Allergies  Allergen Reactions  . Penicillin G Anaphylaxis    Gets extremely high fevers.  . Sulfa Antibiotics Other (See Comments)    It will kill him  . Amoxicillin   . Cephalosporins Other (See Comments)    Reaction unspecified.  . Quinolones Other (See Comments)    Reaction unspecified  . Sulfamethoxazole Swelling  Patient Active Problem List   Diagnosis Date Noted  . Other fatigue 09/15/2017  . Shortness of breath on exertion 09/15/2017  . Type 2 diabetes mellitus without complication, without long-term current use of insulin (HCC) 09/15/2017  . Other hyperlipidemia 09/15/2017  . Essential hypertension 09/15/2017  . DDD (degenerative disc disease), cervical 05/03/2017  . Cervical herniated disc 10/21/2016  . Fatty liver 09/22/2016  . Spinal stenosis in cervical region 08/05/2016  . Diabetes type 2, controlled (HCC) 10/15/2015  . Hypertriglyceridemia 10/15/2015  . Benign essential HTN  12/05/2014  . Macroglossia 09/05/2014  . GAD (generalized anxiety disorder) 12/11/2013  . Motor tic disorder 12/11/2013  . OSA on CPAP 10/04/2013  . Hypersomnia with sleep apnea, unspecified 10/04/2013  . High frequency hearing loss 08/24/2013  . Peripheral neuropathy 04/19/2013  . Abnormal gait 10/19/2012  . Cervical myelopathy (HCC) 10/19/2012  . Esophageal reflux 07/27/2011  . Morbid obesity due to excess calories (HCC) 07/22/2011  . ADD (attention deficit disorder) 07/22/2011  . ADEM (acute disseminated encephalomyelitis) 07/03/2011  . Splenomegaly 07/03/2011  . Elevated LFTs 07/03/2011    Past Medical History:  Diagnosis Date  . ADEM (acute disseminated encephalomyelitis)   . Allergy   . Anxiety 2005  . Complication of anesthesia   . Depression 2005  . Difficult intubation    difficult intubating and exbutating "throat Clinched around tube"  . Headache(784.0)   . Hearing loss   . Hyperinsulinemia 07/04/11   "I'm on metformin cause I produce too much insulin"  . Hyperlipidemia   . Hypertension   . Mononucleosis, infectious, with hepatitis 06/2011  . MS (multiple sclerosis) (HCC)    patinet has ADEM not MS per Patient  . Nausea    occasional per history form 08/18/11  . Neuromuscular disorder (HCC)   . Pneumonia 2010  . Sleep apnea    uses CPAP at home  . Stroke (HCC) 2000   TIA  . TIA (transient ischemic attack) 2000   denies residual  . TIA (transient ischemic attack)     Past Surgical History:  Procedure Laterality Date  . ANTERIOR CERVICAL DECOMP/DISCECTOMY FUSION N/A 10/21/2016   Procedure: ANTERIOR CERVICAL DECOMPRESSION/DISCECTOMY FUSION, INTERBODY PROSTHESIS,PLATE CERVICAL FOUR- CERVICAL FIVE, CERVICAL FIVE- CERVICAL SIX;  Surgeon: Tressie Stalker, MD;  Location: [facility] OR;  Service: Neurosurgery;  Laterality: N/A;  . CHOLECYSTECTOMY  08/02/2011   Procedure: LAPAROSCOPIC CHOLECYSTECTOMY;  Surgeon: Shelly Rubenstein, MD;  Location: [facility] OR;  Service: General;;    . COLONOSCOPY  2003  . ESOPHAGOGASTRODUODENOSCOPY  2003  . ESOPHAGOGASTRODUODENOSCOPY  07/28/2011   Procedure: ESOPHAGOGASTRODUODENOSCOPY (EGD);  Surgeon: [name], MD;  Location: [facility] ENDOSCOPY;  Service: Endoscopy;  Laterality: N/A;  . MYRINGOTOMY  1979; 1980   bilaterally  . TONSILLECTOMY AND ADENOIDECTOMY  ~ 1980    Social History   Socioeconomic History  . Marital status: Significant Other    Spouse name: Not on file  . Number of children: 0  . Years of education: college  . Highest education level: Not on file  Occupational History  . Occupation: disabled   Social Needs  . Financial resource strain: Not on file  . Food insecurity:    Worry: Not on file    Inability: Not on file  . Transportation needs:    Medical: Not on file    Non-medical: Not on file  Tobacco Use  . Smoking status: Former Smoker    Packs/day: 3.00    Years: 3.00    Pack years: 9.00  Types: Cigarettes    Last attempt to quit: 08/18/1987    Years since quitting: 30.5  . Smokeless tobacco: Never Used  Substance and Sexual Activity  . Alcohol use: Yes    Alcohol/week: 0.0 standard drinks    Comment: 07/04/11 "glass of wine or spirits once a month"  . Drug use: Yes    Types: Marijuana    Comment: have not used in a 1.5 years  . Sexual activity: Yes    Partners: Female  Lifestyle  . Physical activity:    Days per week: Not on file    Minutes per session: Not on file  . Stress: Not on file  Relationships  . Social connections:    Talks on phone: Not on file    Gets together: Not on file    Attends religious service: Not on file    Active member of club or organization: Not on file    Attends meetings of clubs or organizations: Not on file    Relationship status: Not on file  . Intimate partner violence:    Fear of current or ex partner: Not on file    Emotionally abused: Not on file    Physically abused: Not on file    Forced sexual activity: Not on file  Other Topics Concern  . Not  on file  Social History Narrative   Lives in [place] with 2 domestic male partners "his family unit"   Caffeine: minimal    Family History  Problem Relation Age of Onset  . Coronary artery disease Father   . Stroke Father   . Cancer Father   . High blood pressure Father   . Alcoholism Father   . Diabetes Mother   . Obesity Mother   . Cancer Maternal Grandmother        bone  . Cancer Other        Stomach -Great-great grandmother     Review of Systems  Constitutional: Negative.  Negative for chills and fever.  HENT: Negative.  Negative for nosebleeds, sinus pain and sore throat.   Eyes: Negative.  Negative for blurred vision and double vision.  Respiratory: Negative.  Negative for cough and shortness of breath.   Cardiovascular: Negative.  Negative for chest pain and palpitations.  Gastrointestinal: Negative.  Negative for abdominal pain, diarrhea, nausea and vomiting.  Musculoskeletal: Positive for neck pain.  Skin: Negative.  Negative for rash.  Neurological: Negative for dizziness, sensory change, focal weakness, seizures and headaches.  Endo/Heme/Allergies: Negative.   All other systems reviewed and are negative.   Vitals:   02/17/18 0811  BP: 139/89  Pulse: 82  Temp: 97.9 F (36.6 C)  SpO2: 96%    Physical Exam  Constitutional: He is oriented to person, place, and time. He appears well-developed.  Obese  HENT:  Head: Normocephalic and atraumatic.  Eyes: Pupils are equal, round, and reactive to light. EOM are normal.  Neck: Muscular tenderness (left trapezius) present. No spinous process tenderness present. Decreased range of motion present.  Cardiovascular: Normal rate, regular rhythm and normal heart sounds.  Pulmonary/Chest: Effort normal and breath sounds normal.  Neurological: He is alert and oriented to person, place, and time. He displays normal reflexes. No cranial nerve deficit or sensory deficit. He exhibits normal muscle tone. Coordination  normal.  Skin: Skin is warm and dry. Capillary refill takes less than 2 seconds.  Psychiatric: He has a normal mood and affect. His behavior is normal.  Vitals reviewed.  ASSESSMENT & PLAN: Brandon [name] was seen today for pain.  Diagnoses and all orders for this visit:  Chronic neck pain Comments: With acute exacerbation Orders: -     ketorolac (TORADOL) injection 60 mg  DDD (degenerative disc disease), cervical  Cervical disc disorder with radiculopathy of cervical region    Patient Instructions    Must go to ER now for further evaluation, treatment, and diagnostic imaging.  Cervical Radiculopathy Cervical radiculopathy means that a nerve in the neck is pinched or bruised. This can cause pain or loss of feeling (numbness) that runs from your neck to your arm and fingers. Follow these instructions at home: Managing pain  Take over-the-counter and prescription medicines only as told by your doctor.  If directed, put ice on the injured or painful area. ? Put ice in a plastic bag. ? Place a towel between your skin and the bag. ? Leave the ice on for 20 minutes, 2-3 times per day.  If ice does not help, you can try using heat. Take a warm shower or warm bath, or use a heat pack as told by your doctor.  You may try a gentle neck and shoulder massage. Activity  Rest as needed. Follow instructions from your doctor about any activities to avoid.  Do exercises as told by your doctor or physical therapist. General instructions  If you were given a soft collar, wear it as told by your doctor.  Use a flat pillow when you sleep.  Keep all follow-up visits as told by your doctor. This is important. Contact a doctor if:  Your condition does not improve with treatment. Get help right away if:  Your pain gets worse and is not controlled with medicine.  You lose feeling or feel weak in your hand, arm, face, or leg.  You have a fever.  You have a stiff neck.  You cannot  control when you poop or pee (have incontinence).  You have trouble with walking, balance, or talking. This information is not intended to replace advice given to you by your health care provider. Make sure you discuss any questions you have with your health care provider. Document Released: 06/11/2011 Document Revised: 11/28/2015 Document Reviewed: 08/16/2014 Elsevier Interactive Patient Education  [employer].  to the ER now for further treatment, evaluation, and diagnostic imaging.   I will contact you with your lab results within the next 2 weeks.  If you have not heard from [place] then please contact us. The fastest way to get your results is to register for My Chart.   IF you received an x-ray today, you will receive an invoice from [facility]. Please contact [facility] at [phone] with questions or concerns regarding your invoice.   IF you received labwork today, you will receive an invoice from [facility]. Please contact [facility] at [phone] with questions or concerns regarding your invoice.   Our billing staff will not be able to assist you with questions regarding bills from these companies.  You will be contacted with the lab results as soon as they are available. The fastest way to get your results is to activate your My Chart account. Instructions are located on the last page of this paperwork. If you have not heard from [place] regarding the results in 2 weeks, please contact this office.          Edwina Barth, MD [facility]

## 2018-02-17 NOTE — ED Notes (Signed)
Patient to MRI.

## 2018-02-17 NOTE — Patient Instructions (Addendum)
  Must go to ER now for further evaluation, treatment, and diagnostic imaging.  Cervical Radiculopathy Cervical radiculopathy means that a nerve in the neck is pinched or bruised. This can cause pain or loss of feeling (numbness) that runs from your neck to your arm and fingers. Follow these instructions at home: Managing pain  Take over-the-counter and prescription medicines only as told by your doctor.  If directed, put ice on the injured or painful area. ? Put ice in a plastic bag. ? Place a towel between your skin and the bag. ? Leave the ice on for 20 minutes, 2-3 times per day.  If ice does not help, you can try using heat. Take a warm shower or warm bath, or use a heat pack as told by your doctor.  You may try a gentle neck and shoulder massage. Activity  Rest as needed. Follow instructions from your doctor about any activities to avoid.  Do exercises as told by your doctor or physical therapist. General instructions  If you were given a soft collar, wear it as told by your doctor.  Use a flat pillow when you sleep.  Keep all follow-up visits as told by your doctor. This is important. Contact a doctor if:  Your condition does not improve with treatment. Get help right away if:  Your pain gets worse and is not controlled with medicine.  You lose feeling or feel weak in your hand, arm, face, or leg.  You have a fever.  You have a stiff neck.  You cannot control when you poop or pee (have incontinence).  You have trouble with walking, balance, or talking. This information is not intended to replace advice given to you by your health care provider. Make sure you discuss any questions you have with your health care provider. Document Released: 06/11/2011 Document Revised: 11/28/2015 Document Reviewed: 08/16/2014 Elsevier Interactive Patient Education  [employer].  to the ER now for further treatment, evaluation, and diagnostic imaging.   I will contact you  with your lab results within the next 2 weeks.  If you have not heard from [place] then please contact us. The fastest way to get your results is to register for My Chart.   IF you received an x-ray today, you will receive an invoice from [facility]. Please contact [facility] at [phone] with questions or concerns regarding your invoice.   IF you received labwork today, you will receive an invoice from [facility]. Please contact [facility] at [phone] with questions or concerns regarding your invoice.   Our billing staff will not be able to assist you with questions regarding bills from these companies.  You will be contacted with the lab results as soon as they are available. The fastest way to get your results is to activate your My Chart account. Instructions are located on the last page of this paperwork. If you have not heard from [place] regarding the results in 2 weeks, please contact this office.

## 2018-02-17 NOTE — Discharge Instructions (Signed)
Return here as needed.  Follow-up with your neurosurgeon and primary doctor.  I ordered the MRI agrees for imaging so I would call and verify with them the timing

## 2018-02-20 ENCOUNTER — Encounter: Payer: Self-pay | Admitting: Physician Assistant

## 2018-02-21 ENCOUNTER — Ambulatory Visit (INDEPENDENT_AMBULATORY_CARE_PROVIDER_SITE_OTHER): Payer: 59

## 2018-02-21 ENCOUNTER — Ambulatory Visit: Payer: 59 | Admitting: Physician Assistant

## 2018-02-21 ENCOUNTER — Other Ambulatory Visit: Payer: Self-pay

## 2018-02-21 ENCOUNTER — Encounter: Payer: Self-pay | Admitting: Physician Assistant

## 2018-02-21 VITALS — BP 142/86 | HR 90 | Temp 98.3°F | Resp 18 | Ht 68.0 in | Wt 305.0 lb

## 2018-02-21 DIAGNOSIS — M542 Cervicalgia: Secondary | ICD-10-CM

## 2018-02-21 DIAGNOSIS — R202 Paresthesia of skin: Secondary | ICD-10-CM | POA: Diagnosis not present

## 2018-02-21 DIAGNOSIS — M62838 Other muscle spasm: Secondary | ICD-10-CM

## 2018-02-21 DIAGNOSIS — M5010 Cervical disc disorder with radiculopathy, unspecified cervical region: Secondary | ICD-10-CM

## 2018-02-21 DIAGNOSIS — E781 Pure hyperglyceridemia: Secondary | ICD-10-CM

## 2018-02-21 DIAGNOSIS — M5030 Other cervical disc degeneration, unspecified cervical region: Secondary | ICD-10-CM | POA: Diagnosis not present

## 2018-02-21 MED ORDER — CYCLOBENZAPRINE HCL ER 15 MG PO CP24: 30.0000 mg | ORAL_CAPSULE | Freq: Every day | ORAL | 5 refills | Status: DC

## 2018-02-21 MED ORDER — PREDNISONE 10 MG PO TABS: ORAL_TABLET | ORAL | 0 refills | Status: AC

## 2018-02-21 MED ORDER — DIAZEPAM 5 MG PO TABS: 5.0000 mg | ORAL_TABLET | Freq: Three times a day (TID) | ORAL | 0 refills | Status: DC | PRN

## 2018-02-21 MED ORDER — KETOROLAC TROMETHAMINE 60 MG/2ML IM SOLN
60.0000 mg | Freq: Once | INTRAMUSCULAR | Status: AC
  Administered 2018-02-21: 60 mg via INTRAMUSCULAR

## 2018-02-21 MED ORDER — OXYCODONE-ACETAMINOPHEN 7.5-325 MG PO TABS: 1.0000 | ORAL_TABLET | ORAL | 0 refills | Status: DC | PRN

## 2018-02-21 NOTE — Progress Notes (Signed)
Brandon [name]  MRN: [id number] DOB: 04-13-1971  PCP: Morrell Riddle, PA-C  Chief Complaint  Patient presents with  . Neck Pain    needs pain meds     Subjective:  Pt presents to clinic for left-sided neck pain that radiates down into his left arm.  This reminds him of when he had the disc disease that had surgery approximately 2 years ago.  He had no injury that he knows of.  He does note that he has not been sleeping as well and getting much for neck spasms for the last year since he has not been able to be on the Amrix per insurance coverage.  Zanaflex does not work as well.  They did put him on Flexeril in the emergency department he was seen but it does not seem to be helping.  Many times he is unable to get in a comfortable position.  Pain starts on the left side of his neck and goes down past his shoulder of the left side with paresthesias running into the third and fourth digit of left hand.  He does feel weak compared to normal, though his baseline is diminished on the left side due to his ADEM.  He was seen in the ED on 8/15 and the records were reviewed.-- they were unable to get an MRI that day due to he did not fit into the MRI.  The oxycodone did help but he did not feel like it was a strong enough dose for him to get completely comfortable.  The Valium helped but he needed both medications together.  History is obtained by patient.  Review of Systems  Musculoskeletal: Positive for neck pain. Negative for neck stiffness.  Neurological: Positive for weakness and numbness (More paresthesias).    Patient Active Problem List   Diagnosis Date Noted  . Chronic neck pain 02/17/2018  . Other fatigue 09/15/2017  . Shortness of breath on exertion 09/15/2017  . Type 2 diabetes mellitus without complication, without long-term current use of insulin (HCC) 09/15/2017  . Other hyperlipidemia 09/15/2017  . Essential hypertension 09/15/2017  . DDD (degenerative disc disease), cervical  05/03/2017  . Cervical herniated disc 10/21/2016  . Fatty liver 09/22/2016  . Spinal stenosis in cervical region 08/05/2016  . Diabetes type 2, controlled (HCC) 10/15/2015  . Hypertriglyceridemia 10/15/2015  . Benign essential HTN 12/05/2014  . Macroglossia 09/05/2014  . GAD (generalized anxiety disorder) 12/11/2013  . Motor tic disorder 12/11/2013  . OSA on CPAP 10/04/2013  . Hypersomnia with sleep apnea, unspecified 10/04/2013  . High frequency hearing loss 08/24/2013  . Peripheral neuropathy 04/19/2013  . Abnormal gait 10/19/2012  . Cervical myelopathy (HCC) 10/19/2012  . Esophageal reflux 07/27/2011  . Morbid obesity due to excess calories (HCC) 07/22/2011  . ADD (attention deficit disorder) 07/22/2011  . ADEM (acute disseminated encephalomyelitis) 07/03/2011  . Splenomegaly 07/03/2011  . Elevated LFTs 07/03/2011    Current Outpatient Medications on File Prior to Visit  Medication Sig Dispense Refill  . atorvastatin (LIPITOR) 20 MG tablet Take 1 tablet (20 mg total) by mouth at bedtime. (0000) 90 tablet 1  . azelastine (ASTELIN) 0.1 % nasal spray USE 2 SPRAYS IN EACH NOSTRIL TWICE A DAY 30 mL 1  . benzonatate (TESSALON) 100 MG capsule Take 1-2 capsules (100-200 mg total) by mouth 3 (three) times daily as needed for cough. 40 capsule 0  . colesevelam (WELCHOL) 625 MG tablet Take 3 tablets (1,875 mg total) by mouth daily with breakfast.  540 tablet 1  . cyclobenzaprine (FLEXERIL) 10 MG tablet TAKE 1 TABLET BY ORAL ROUTE 3 TIMES EVERY DAY AS NEEDED FOR MUSCLE SPASMS  1  . diclofenac (VOLTAREN) 75 MG EC tablet Take 1 tablet (75 mg total) by mouth 2 (two) times daily. 180 tablet 1  . gabapentin (NEURONTIN) 300 MG capsule Take 3 capsules (900 mg total) by mouth 2 (two) times daily. 540 capsule 1  . Guaifenesin (MUCINEX MAXIMUM STRENGTH) 1200 MG TB12 Take 1 tablet (1,200 mg total) by mouth every 12 (twelve) hours as needed. 14 tablet 1  . Insulin Pen Needle (BD PEN NEEDLE NANO U/F) 32G  X 4 MM MISC 1 Package by Does not apply route 2 (two) times daily. 100 each 0  . liraglutide (VICTOZA) 18 MG/3ML SOPN Inject 0.1 mLs (0.6 mg total) into the skin every morning. 1 pen 0  . lisinopril-hydrochlorothiazide (PRINZIDE,ZESTORETIC) 10-12.5 MG tablet Take 1 tablet by mouth at bedtime. 0000 90 tablet 1  . metFORMIN (GLUCOPHAGE-XR) 500 MG 24 hr tablet TAKE 2 TABLETS (1,000 MG TOTAL) BY MOUTH 2 (TWO) TIMES DAILY. 360 tablet 1  . NON FORMULARY CPAP machine at bedtime & sleep    . Vitamin D, Ergocalciferol, (DRISDOL) 50000 units CAPS capsule Take 1 capsule (50,000 Units total) by mouth every 7 (seven) days. 4 capsule 0  . tiZANidine (ZANAFLEX) 4 MG capsule Take 1 capsule (4 mg total) by mouth 3 (three) times daily. (Patient not taking: Reported on 02/21/2018) 270 capsule 1   No current facility-administered medications on file prior to visit.     Allergies  Allergen Reactions  . Penicillin G Anaphylaxis    Gets extremely high fevers.  . Sulfa Antibiotics Other (See Comments)    It will kill him  . Amoxicillin   . Cephalosporins Other (See Comments)    Reaction unspecified.  . Quinolones Other (See Comments)    Reaction unspecified  . Sulfamethoxazole Swelling    Past Medical History:  Diagnosis Date  . ADEM (acute disseminated encephalomyelitis)   . Allergy   . Anxiety 2005  . Complication of anesthesia   . Depression 2005  . Difficult intubation    difficult intubating and exbutating "throat Clinched around tube"  . Headache(784.0)   . Hearing loss   . Hyperinsulinemia 07/04/11   "I'm on metformin cause I produce too much insulin"  . Hyperlipidemia   . Hypertension   . Mononucleosis, infectious, with hepatitis 06/2011  . MS (multiple sclerosis) (HCC)    patinet has ADEM not MS per Patient  . Nausea    occasional per history form 08/18/11  . Neuromuscular disorder (HCC)   . Pneumonia 2010  . Sleep apnea    uses CPAP at home  . Stroke (HCC) 2000   TIA  . TIA  (transient ischemic attack) 2000   denies residual  . TIA (transient ischemic attack)    Social History   Social History Narrative   Lives in [place] with 2 domestic male partners "his family unit"   Caffeine: minimal   Social History   Tobacco Use  . Smoking status: Former Smoker    Packs/day: 3.00    Years: 3.00    Pack years: 9.00    Types: Cigarettes    Last attempt to quit: 08/18/1987    Years since quitting: 30.5  . Smokeless tobacco: Never Used  Substance Use Topics  . Alcohol use: Yes    Alcohol/week: 0.0 standard drinks    Comment: 07/04/11 "glass of wine  or spirits once a month"  . Drug use: Yes    Types: Marijuana    Comment: have not used in a 1.5 years   family history includes Alcoholism in his father; Cancer in his father, maternal grandmother, and other; Coronary artery disease in his father; Diabetes in his mother; High blood pressure in his father; Obesity in his mother; Stroke in his father.     Objective:  BP (!) 142/86   Pulse 90   Temp 98.3 F (36.8 C) (Oral)   Resp 18   Ht 5\' 8"  (1.727 m)   Wt (!) 305 lb (138.3 kg)   SpO2 95%   BMI 46.38 kg/m  Body mass index is 46.38 kg/m.  Wt Readings from Last 3 Encounters:  02/21/18 (!) 305 lb (138.3 kg)  02/17/18 297 lb (134.7 kg)  02/17/18 299 lb 12.8 oz (136 kg)    Physical Exam  Constitutional: He is oriented to person, place, and time. He appears well-developed and well-nourished.  HENT:  Head: Normocephalic and atraumatic.  Right Ear: External ear normal.  Left Ear: External ear normal.  Eyes: Conjunctivae are normal.  Neck: Normal range of motion.  Pulmonary/Chest: Effort normal.  Musculoskeletal:       Right shoulder: Normal.       Left shoulder: He exhibits decreased range of motion (Decreased abduction by 50%.), tenderness and spasm.       Cervical back: He exhibits decreased range of motion and tenderness.  Patient appears very uncomfortable.  He is moving around the room and  screaming out at times.  Patient is almost not able to tolerate exam, regards, flinches and pulls away.  Neurological: He is alert and oriented to person, place, and time. A sensory deficit (Slight decrease in left hand) is present. No cranial nerve deficit.  Strength in left hand 3 out of 5 though his baseline is diminished.  Skin: Skin is warm and dry.  Psychiatric: Judgment normal.  Vitals reviewed.   Dg Cervical Spine 2 Or 3 Views  Result Date: 02/21/2018 CLINICAL DATA:  Left-sided neck pain. EXAM: CERVICAL SPINE - 2-3 VIEW COMPARISON:  Cervical spine x-rays dated January 21, 2018. FINDINGS: The lateral view is diagnostic to the C7 level. Prior C4-C6 ACDF with solid osseous fusion. No evidence of hardware failure or loosening. There is no acute fracture or subluxation. Vertebral body heights are preserved. Straightening of the normal cervical lordosis. Alignment is normal. Mild disc height loss and uncovertebral hypertrophy at C3-C4 and C6-C7, unchanged. Normal prevertebral soft tissues. IMPRESSION: 1.  No acute osseous abnormality. 2. Prior C4-C6 ACDF without hardware complication. Mild adjacent segment degenerative disc disease at C3-C4 and C6-C7. Electronically Signed   By: Obie Dredge M.D.   On: 02/21/2018 11:42     Assessment and Plan :  Cervical pain - Plan: DG Cervical Spine 2 or 3 views, ketorolac (TORADOL) injection 60 mg, oxyCODONE-acetaminophen (PERCOCET) 7.5-325 MG tablet, predniSONE (DELTASONE) 10 MG tablet  Arm paresthesia, left - Plan: DG Cervical Spine 2 or 3 views, ketorolac (TORADOL) injection 60 mg, predniSONE (DELTASONE) 10 MG tablet  DDD (degenerative disc disease), cervical - Plan: cyclobenzaprine (AMRIX) 15 MG 24 hr capsule, predniSONE (DELTASONE) 10 MG tablet  Cervical disc disorder with radiculopathy of cervical region - Plan: cyclobenzaprine (AMRIX) 15 MG 24 hr capsule, predniSONE (DELTASONE) 10 MG tablet  Muscle spasm - Plan: cyclobenzaprine (AMRIX) 15 MG 24 hr  capsule, diazepam (VALIUM) 5 MG tablet   Change to generic Amrix 15 mg take  2 capsules daily.  Continue Valium for now for this current injury.  Refill pain medications did increase to 7.5 mg every 4 hours.  Start prednisone taper as that did help him in the past when he had something similar.  X-ray shows no change in hardware.  Spoke with Dr. Lovell Sheehan office they have ordered the MRI and they are waiting for approval.  I will not order the MRI communicated this with patient through my chart.  He did ask for a Toradol injection as it helped slightly last time and that was given to him.  Patient verbalized to me that they understand the following: diagnosis, what is being done for them, what to expect and what should be done at home.  Their questions have been answered.  See after visit summary for patient specific instructions.  Benny Lennert PA-C  Primary Care at [facility] 02/22/2018 7:47 AM  Please note: Portions of this report may have been transcribed using dragon voice recognition software. Every effort was made to ensure accuracy; however, inadvertent computerized transcription errors may be present.

## 2018-02-21 NOTE — Telephone Encounter (Signed)
Patient has seen [name] 02-21-18

## 2018-02-21 NOTE — Patient Instructions (Addendum)
Sent in generic amrix -- you can stop the Cyclobenzapring 10mg  3x/day and stop the zanaflex (the capsules) because they are not helping    Brandon [name] - [address], [place], [place] [address] Phone: [phone]   If you have lab work done today you will be contacted with your lab results within the next 2 weeks.  If you have not heard from [place] then please contact us. The fastest way to get your results is to register for My Chart.   IF you received an x-ray today, you will receive an invoice from [facility]. Please contact [facility] at [phone] with questions or concerns regarding your invoice.   IF you received labwork today, you will receive an invoice from [facility]. Please contact [facility] at [phone] with questions or concerns regarding your invoice.   Our billing staff will not be able to assist you with questions regarding bills from these companies.  You will be contacted with the lab results as soon as they are available. The fastest way to get your results is to activate your My Chart account. Instructions are located on the last page of this paperwork. If you have not heard from [place] regarding the results in 2 weeks, please contact this office.

## 2018-02-22 ENCOUNTER — Encounter: Payer: Self-pay | Admitting: Physician Assistant

## 2018-02-24 ENCOUNTER — Encounter: Payer: Self-pay | Admitting: Physician Assistant

## 2018-02-28 ENCOUNTER — Other Ambulatory Visit: Payer: Self-pay | Admitting: Neurosurgery

## 2018-02-28 DIAGNOSIS — M5412 Radiculopathy, cervical region: Secondary | ICD-10-CM

## 2018-02-28 MED ORDER — COLESEVELAM HCL 625 MG PO TABS: 1875.0000 mg | ORAL_TABLET | Freq: Every day | ORAL | 1 refills | Status: AC

## 2018-03-01 ENCOUNTER — Other Ambulatory Visit: Payer: Self-pay | Admitting: Physician Assistant

## 2018-03-01 DIAGNOSIS — M62838 Other muscle spasm: Secondary | ICD-10-CM

## 2018-03-02 MED ORDER — DIAZEPAM 5 MG PO TABS: 5.0000 mg | ORAL_TABLET | Freq: Three times a day (TID) | ORAL | 0 refills | Status: DC | PRN

## 2018-03-02 NOTE — Telephone Encounter (Signed)
Patient is requesting a refill of the following medications: Requested Prescriptions   Pending Prescriptions Disp Refills  . diazepam (VALIUM) 5 MG tablet 21 tablet 0    Sig: Take 1 tablet (5 mg total) by mouth every 8 (eight) hours as needed for muscle spasms.    Date of patient request: 03/01/2018 Last office visit: 02/21/2018 Date of last refill: 02/21/2018 Last refill amount: 21 Follow up time period per chart:

## 2018-03-04 ENCOUNTER — Ambulatory Visit
Admission: RE | Admit: 2018-03-04 | Discharge: 2018-03-04 | Disposition: A | Discharge status: Home / Self Care | Payer: 59 | Source: Ambulatory Visit | Attending: Neurosurgery | Admitting: Neurosurgery

## 2018-03-04 ENCOUNTER — Encounter: Payer: Self-pay | Admitting: Physician Assistant

## 2018-03-04 ENCOUNTER — Other Ambulatory Visit: Payer: Self-pay | Admitting: Physician Assistant

## 2018-03-04 DIAGNOSIS — M542 Cervicalgia: Secondary | ICD-10-CM

## 2018-03-04 DIAGNOSIS — M4802 Spinal stenosis, cervical region: Secondary | ICD-10-CM | POA: Diagnosis not present

## 2018-03-04 DIAGNOSIS — M5412 Radiculopathy, cervical region: Secondary | ICD-10-CM

## 2018-03-04 MED ORDER — OXYCODONE-ACETAMINOPHEN 10-325 MG PO TABS: 1.0000 | ORAL_TABLET | Freq: Four times a day (QID) | ORAL | 0 refills | Status: DC | PRN

## 2018-03-04 NOTE — Telephone Encounter (Signed)
Patient is requesting a refill of the following medications: Requested Prescriptions   Pending Prescriptions Disp Refills  . oxyCODONE-acetaminophen (PERCOCET) 7.5-325 MG tablet 60 tablet 0    Sig: Take 1 tablet by mouth every 4 (four) hours as needed for severe pain.    Date of patient request: 03/04/2018 Last office visit: 02/21/2018 Date of last refill: 02/21/2018 Last refill amount: 60 Follow up time period per chart:

## 2018-03-05 ENCOUNTER — Other Ambulatory Visit (INDEPENDENT_AMBULATORY_CARE_PROVIDER_SITE_OTHER): Payer: Self-pay | Admitting: Family Medicine

## 2018-03-05 DIAGNOSIS — E559 Vitamin D deficiency, unspecified: Secondary | ICD-10-CM

## 2018-03-08 ENCOUNTER — Encounter: Payer: Self-pay | Admitting: Physician Assistant

## 2018-03-09 ENCOUNTER — Telehealth: Payer: Self-pay | Admitting: Physician Assistant

## 2018-03-09 ENCOUNTER — Other Ambulatory Visit: Payer: Self-pay | Admitting: Physician Assistant

## 2018-03-09 ENCOUNTER — Ambulatory Visit (INDEPENDENT_AMBULATORY_CARE_PROVIDER_SITE_OTHER): Payer: 59 | Admitting: Family Medicine

## 2018-03-09 VITALS — BP 124/81 | HR 84 | Temp 98.2°F | Ht 68.0 in | Wt 306.0 lb

## 2018-03-09 DIAGNOSIS — E119 Type 2 diabetes mellitus without complications: Secondary | ICD-10-CM | POA: Diagnosis not present

## 2018-03-09 DIAGNOSIS — M62838 Other muscle spasm: Secondary | ICD-10-CM

## 2018-03-09 DIAGNOSIS — Z6841 Body Mass Index (BMI) 40.0 and over, adult: Secondary | ICD-10-CM

## 2018-03-09 DIAGNOSIS — R202 Paresthesia of skin: Secondary | ICD-10-CM

## 2018-03-09 DIAGNOSIS — M5010 Cervical disc disorder with radiculopathy, unspecified cervical region: Secondary | ICD-10-CM

## 2018-03-09 DIAGNOSIS — M542 Cervicalgia: Secondary | ICD-10-CM

## 2018-03-09 MED ORDER — OXYCODONE-ACETAMINOPHEN 10-325 MG PO TABS: 1.0000 | ORAL_TABLET | ORAL | 0 refills | Status: DC | PRN

## 2018-03-09 MED ORDER — DIAZEPAM 5 MG PO TABS: 5.0000 mg | ORAL_TABLET | Freq: Three times a day (TID) | ORAL | 0 refills | Status: DC | PRN

## 2018-03-09 NOTE — Progress Notes (Signed)
Office: [phone]  /  Fax: [phone]   HPI:   Chief Complaint: OBESITY Brandon [name] is here to discuss his progress with his obesity treatment plan. He is on the keep a food journal with 1800 to 2300 calories and 100 grams of protein daily and is following his eating plan approximately 35 % of the time. He states he is exercising 0 minutes 0 times per week. Brandon [name] had another back injury and he is in a lot of pain. He notes increased stress and comfort eating; and he is retraining a lot of fluid. His weight is (!) 306 lb (138.8 kg) today and has not lost weight since his last visit. He has lost 0 lbs since starting treatment with [place].  Diabetes II Brandon [name] has a diagnosis of diabetes type II. Brandon [name] is not checking his blood sugar. His last A1c was at 5.7, but he has increased his simple carbohydrates while he has been dealing with significant pain. Brandon [name] is on Victoza and he has some GI upset, but he feels this may be due to his oxycontin. Brandon [name] denies any hypoglycemic episodes. He has been working on intensive lifestyle modifications including diet, exercise, and weight loss to help control his blood glucose levels.  ALLERGIES: Allergies  Allergen Reactions  . Penicillin G Anaphylaxis    Gets extremely high fevers.  . Sulfa Antibiotics Other (See Comments)    It will kill him  . Amoxicillin   . Cephalosporins Other (See Comments)    Reaction unspecified.  . Quinolones Other (See Comments)    Reaction unspecified  . Sulfamethoxazole Swelling    MEDICATIONS: Current Outpatient Medications on File Prior to Visit  Medication Sig Dispense Refill  . atorvastatin (LIPITOR) 20 MG tablet Take 1 tablet (20 mg total) by mouth at bedtime. (0000) 90 tablet 1  . azelastine (ASTELIN) 0.1 % nasal spray USE 2 SPRAYS IN EACH NOSTRIL TWICE A DAY 30 mL 1  . benzonatate (TESSALON) 100 MG capsule Take 1-2 capsules (100-200 mg total) by mouth 3 (three) times daily as needed for cough. 40 capsule  0  . colesevelam (WELCHOL) 625 MG tablet Take 3 tablets (1,875 mg total) by mouth daily with breakfast. 540 tablet 1  . cyclobenzaprine (AMRIX) 15 MG 24 hr capsule Take 2 capsules (30 mg total) by mouth daily. 60 capsule 5  . cyclobenzaprine (FLEXERIL) 10 MG tablet TAKE 1 TABLET BY ORAL ROUTE 3 TIMES EVERY DAY AS NEEDED FOR MUSCLE SPASMS  1  . diazepam (VALIUM) 5 MG tablet Take 1 tablet (5 mg total) by mouth every 8 (eight) hours as needed for muscle spasms. 21 tablet 0  . diclofenac (VOLTAREN) 75 MG EC tablet Take 1 tablet (75 mg total) by mouth 2 (two) times daily. 180 tablet 1  . gabapentin (NEURONTIN) 300 MG capsule Take 3 capsules (900 mg total) by mouth 2 (two) times daily. 540 capsule 1  . Guaifenesin (MUCINEX MAXIMUM STRENGTH) 1200 MG TB12 Take 1 tablet (1,200 mg total) by mouth every 12 (twelve) hours as needed. 14 tablet 1  . Insulin Pen [name] (BD PEN [name] NANO U/F) 32G X 4 MM MISC 1 Package by Does not apply route 2 (two) times daily. 100 each 0  . liraglutide (VICTOZA) 18 MG/3ML SOPN Inject 0.1 mLs (0.6 mg total) into the skin every morning. 1 pen 0  . lisinopril-hydrochlorothiazide (PRINZIDE,ZESTORETIC) 10-12.5 MG tablet Take 1 tablet by mouth at bedtime. 0000 90 tablet 1  . metFORMIN (GLUCOPHAGE-XR) 500 MG 24 hr tablet TAKE  2 TABLETS (1,000 MG TOTAL) BY MOUTH 2 (TWO) TIMES DAILY. 360 tablet 1  . NON FORMULARY CPAP machine at bedtime & sleep    . oxyCODONE-acetaminophen (PERCOCET) 10-325 MG tablet Take 1 tablet by mouth every 6 (six) hours as needed for pain. 28 tablet 0  . oxyCODONE-acetaminophen (PERCOCET) 7.5-325 MG tablet Take 1 tablet by mouth every 4 (four) hours as needed for severe pain. 60 tablet 0  . tiZANidine (ZANAFLEX) 4 MG capsule Take 1 capsule (4 mg total) by mouth 3 (three) times daily. 270 capsule 1  . Vitamin D, Ergocalciferol, (DRISDOL) 50000 units CAPS capsule Take 1 capsule (50,000 Units total) by mouth every 7 (seven) days. 4 capsule 0   No current  facility-administered medications on file prior to visit.     PAST MEDICAL HISTORY: Past Medical History:  Diagnosis Date  . ADEM (acute disseminated encephalomyelitis)   . Allergy   . Anxiety 2005  . Complication of anesthesia   . Depression 2005  . Difficult intubation    difficult intubating and exbutating "throat Clinched around tube"  . Headache(784.0)   . Hearing loss   . Hyperinsulinemia 07/04/11   "I'm on metformin cause I produce too much insulin"  . Hyperlipidemia   . Hypertension   . Mononucleosis, infectious, with hepatitis 06/2011  . MS (multiple sclerosis) (HCC)    patinet has ADEM not MS per Patient  . Nausea    occasional per history form 08/18/11  . Neuromuscular disorder (HCC)   . Pneumonia 2010  . Sleep apnea    uses CPAP at home  . Stroke (HCC) 2000   TIA  . TIA (transient ischemic attack) 2000   denies residual  . TIA (transient ischemic attack)     PAST SURGICAL HISTORY: Past Surgical History:  Procedure Laterality Date  . ANTERIOR CERVICAL DECOMP/DISCECTOMY FUSION N/A 10/21/2016   Procedure: ANTERIOR CERVICAL DECOMPRESSION/DISCECTOMY FUSION, INTERBODY PROSTHESIS,PLATE CERVICAL FOUR- CERVICAL FIVE, CERVICAL FIVE- CERVICAL SIX;  Surgeon: Tressie Stalker, MD;  Location: [facility] OR;  Service: Neurosurgery;  Laterality: N/A;  . CHOLECYSTECTOMY  08/02/2011   Procedure: LAPAROSCOPIC CHOLECYSTECTOMY;  Surgeon: Shelly Rubenstein, MD;  Location: [facility] OR;  Service: General;;  . COLONOSCOPY  2003  . ESOPHAGOGASTRODUODENOSCOPY  2003  . ESOPHAGOGASTRODUODENOSCOPY  07/28/2011   Procedure: ESOPHAGOGASTRODUODENOSCOPY (EGD);  Surgeon: [name], MD;  Location: [facility];  Service: Endoscopy;  Laterality: N/A;  . MYRINGOTOMY  1979; 1980   bilaterally  . TONSILLECTOMY AND ADENOIDECTOMY  ~ 1980    SOCIAL HISTORY: Social History   Tobacco Use  . Smoking status: Former Smoker    Packs/day: 3.00    Years: 3.00    Pack years: 9.00    Types: Cigarettes    Last  attempt to quit: 08/18/1987    Years since quitting: 30.5  . Smokeless tobacco: Never Used  Substance Use Topics  . Alcohol use: Yes    Alcohol/week: 0.0 standard drinks    Comment: 07/04/11 "glass of wine or spirits once a month"  . Drug use: Yes    Types: Marijuana    Comment: have not used in a 1.5 years    FAMILY HISTORY: Family History  Problem Relation Age of Onset  . Coronary artery disease Father   . Stroke Father   . Cancer Father   . High blood pressure Father   . Alcoholism Father   . Diabetes Mother   . Obesity Mother   . Cancer Maternal Grandmother  bone  . Cancer Other        Stomach -Great-great grandmother    ROS: Review of Systems  Constitutional: Negative for weight loss.  Musculoskeletal: Positive for back pain.  Endo/Heme/Allergies:       Negative for hypoglycemia    PHYSICAL EXAM: Blood pressure 124/81, pulse 84, temperature 98.2 F (36.8 C), temperature source Oral, height 5\' 8"  (1.727 m), weight (!) 306 lb (138.8 kg), SpO2 95 %. Body mass index is 46.53 kg/m. Physical Exam  Constitutional: He is oriented to person, place, and time. He appears well-developed and well-nourished.  Cardiovascular: Normal rate.  Pulmonary/Chest: Effort normal.  Musculoskeletal: Normal range of motion.  Neurological: He is oriented to person, place, and time.  Skin: Skin is warm and dry.  Psychiatric: He has a normal mood and affect. His behavior is normal.  Vitals reviewed.   RECENT LABS AND TESTS: BMET    Component Value Date/Time   NA 143 02/16/2018 1037   K 3.9 02/16/2018 1037   CL 104 02/16/2018 1037   CO2 24 02/16/2018 1037   GLUCOSE 84 02/16/2018 1037   GLUCOSE 88 08/24/2017 1458   BUN 14 02/16/2018 1037   CREATININE 1.10 02/16/2018 1037   CREATININE 1.02 08/24/2017 1458   CALCIUM 9.4 02/16/2018 1037   GFRNONAA 80 02/16/2018 1037   GFRNONAA >89 07/18/2015 1656   GFRAA 93 02/16/2018 1037   GFRAA >89 07/18/2015 1656   Lab Results    Component Value Date   HGBA1C 5.5 02/16/2018   HGBA1C 5.7 (H) 09/15/2017   HGBA1C 5.6 08/24/2017   HGBA1C 6.5 (H) 02/18/2017   HGBA1C 5.5 09/17/2016   Lab Results  Component Value Date   INSULIN 60.7 (H) 02/16/2018   INSULIN 110.6 (H) 09/15/2017   CBC    Component Value Date/Time   WBC 6.3 09/15/2017 0902   WBC 6.2 10/12/2016 1441   RBC 5.74 09/15/2017 0902   RBC 5.27 10/12/2016 1441   HGB 16.6 09/15/2017 0902   HCT 49.5 09/15/2017 0902   PLT 151 10/12/2016 1441   PLT 158 09/17/2016 1611   MCV 86 09/15/2017 0902   MCH 28.9 09/15/2017 0902   MCH 28.1 10/12/2016 1441   MCHC 33.5 09/15/2017 0902   MCHC 34.0 10/12/2016 1441   RDW 14.7 09/15/2017 0902   LYMPHSABS 2.2 09/15/2017 0902   MONOABS 0.5 12/05/2014 1538   EOSABS 0.1 09/15/2017 0902   BASOSABS 0.0 09/15/2017 0902   Iron/TIBC/Ferritin/ %Sat No results found for: IRON, TIBC, FERRITIN, IRONPCTSAT Lipid Panel     Component Value Date/Time   CHOL 116 02/16/2018 1037   TRIG 340 (H) 02/16/2018 1037   HDL 27 (L) 02/16/2018 1037   CHOLHDL 3.4 08/24/2017 1458   VLDL 70 (H) 10/15/2015 1112   LDLCALC 21 02/16/2018 1037   LDLCALC 57 08/24/2017 1458   Hepatic Function Panel     Component Value Date/Time   PROT 6.7 02/16/2018 1037   ALBUMIN 4.4 02/16/2018 1037   AST 26 02/16/2018 1037   ALT 59 (H) 02/16/2018 1037   ALKPHOS 62 02/16/2018 1037   BILITOT 0.4 02/16/2018 1037   BILIDIR 0.1 04/11/2015 1638   IBILI 0.6 04/11/2015 1638      Component Value Date/Time   TSH 2.080 09/15/2017 0902   TSH 3.190 09/17/2016 1611   TSH 2.339 12/05/2014 1538   Results for JAMESROBERT, BRESCIA "[name]" (MRN [id number]) as of 03/09/2018 16:03  Ref. Range 02/16/2018 10:37  Vitamin D, 25-Hydroxy Latest Ref Range: 30.0 - 100.0  ng/mL 28.6 (L)   ASSESSMENT AND PLAN: Type 2 diabetes mellitus without complication, without long-term current use of insulin (HCC)  Class 3 severe obesity with serious comorbidity and body mass index (BMI) of  45.0 to 49.9 in adult, unspecified obesity type (HCC)  PLAN:  Diabetes II Brandon [name] has been given extensive diabetes education by myself today including ideal fasting and post-prandial blood glucose readings, individual ideal Hgb A1c goals and hypoglycemia prevention. We discussed the importance of good blood sugar control to decrease the likelihood of diabetic complications such as nephropathy, neuropathy, limb loss, blindness, coronary artery disease, and death. We discussed the importance of intensive lifestyle modification including diet, exercise and weight loss as the first line treatment for diabetes. Gaberiel agrees to continue Victoza as is at 0.9 mg (5 clicks past 0.6) and we will continue to monitor and may increase his dose after GI upset resolves. Tahjai agrees to follow up at the agreed upon time.  I spent > than 50% of the 25 minute visit on counseling as documented in the note.  Obesity Brandon [name] is currently in the action stage of change. As such, his goal is to continue with weight loss efforts He has agreed to follow the Category 4 plan Brandon [name] has been instructed to work up to a goal of 150 minutes of combined cardio and strengthening exercise per week for weight loss and overall health benefits. We discussed the following Behavioral Modification Strategies today: no skipping meals, work on meal planning and easy cooking plans, emotional eating strategies and avoiding temptations  Brandon [name] has agreed to follow up with our clinic in 3 to 4 weeks. He was informed of the importance of frequent follow up visits to maximize his success with intensive lifestyle modifications for his multiple health conditions.   OBESITY BEHAVIORAL INTERVENTION VISIT  Today's visit was # 9   Starting weight: 305 lbs Starting date: 09/15/17 Today's weight : 306 lbs  Today's date: 03/09/2018 Total lbs lost to date: 0   ASK: We discussed the diagnosis of obesity with Brandon [name] today and Brandon [name]  agreed to give [place] permission to discuss obesity behavioral modification therapy today.  ASSESS: Brandon [name] has the diagnosis of obesity and his BMI today is 46.54 Brandon [name] is in the action stage of change   ADVISE: Braidan was educated on the multiple health risks of obesity as well as the benefit of weight loss to improve his health. He was advised of the need for long term treatment and the importance of lifestyle modifications to improve his current health and to decrease his risk of future health problems.  AGREE: Multiple dietary modification options and treatment options were discussed and  Brandon [name] agreed to follow the recommendations documented in the above note.  ARRANGE: Brandon [name] was educated on the importance of frequent visits to treat obesity as outlined per CMS and USPSTF guidelines and agreed to schedule his next follow up appointment today.  I, [place] Crane, am acting as transcriptionist for Quillian Quince, MD  I have reviewed the above documentation for accuracy and completeness, and I agree with the above. -Quillian Quince, MD

## 2018-03-09 NOTE — Telephone Encounter (Signed)
Dropped of cd of mri . Put in provider box. Needs refills on oxycoten 10-325 and valium sent messages on my chart FR

## 2018-03-10 ENCOUNTER — Encounter: Payer: Self-pay | Admitting: Physician Assistant

## 2018-03-10 ENCOUNTER — Other Ambulatory Visit: Payer: Self-pay | Admitting: Physician Assistant

## 2018-03-10 DIAGNOSIS — E781 Pure hyperglyceridemia: Secondary | ICD-10-CM

## 2018-03-16 ENCOUNTER — Other Ambulatory Visit: Payer: Self-pay | Admitting: Physician Assistant

## 2018-03-16 DIAGNOSIS — M5010 Cervical disc disorder with radiculopathy, unspecified cervical region: Secondary | ICD-10-CM

## 2018-03-16 DIAGNOSIS — M62838 Other muscle spasm: Secondary | ICD-10-CM

## 2018-03-16 DIAGNOSIS — M542 Cervicalgia: Secondary | ICD-10-CM

## 2018-03-16 NOTE — Telephone Encounter (Signed)
Patient is requesting a refill of the following medications: Requested Prescriptions   Pending Prescriptions Disp Refills  . diazepam (VALIUM) 5 MG tablet [Pharmacy Med Name: DIAZEPAM 5 MG TABLET] 21 tablet 0    Sig: TAKE 1 TABLET (5 MG TOTAL) BY MOUTH EVERY 8 (EIGHT) HOURS AS NEEDED FOR MUSCLE SPASMS.    Date of patient request: 03/16/2018 Last office visit: 02/21/2018 Date of last refill: 03/09/2018 Last refill amount: 21 Follow up time period per chart:

## 2018-03-17 ENCOUNTER — Encounter (HOSPITAL_COMMUNITY): Payer: Self-pay | Admitting: Emergency Medicine

## 2018-03-17 ENCOUNTER — Emergency Department (HOSPITAL_COMMUNITY)
Admission: EM | Admit: 2018-03-17 | Discharge: 2018-03-18 | Disposition: A | Discharge status: Home / Self Care | Payer: 59 | Attending: Emergency Medicine | Admitting: Emergency Medicine

## 2018-03-17 DIAGNOSIS — Z79899 Other long term (current) drug therapy: Secondary | ICD-10-CM | POA: Diagnosis not present

## 2018-03-17 DIAGNOSIS — E119 Type 2 diabetes mellitus without complications: Secondary | ICD-10-CM | POA: Diagnosis not present

## 2018-03-17 DIAGNOSIS — G0400 Acute disseminated encephalitis and encephalomyelitis, unspecified: Secondary | ICD-10-CM | POA: Diagnosis not present

## 2018-03-17 DIAGNOSIS — Z87891 Personal history of nicotine dependence: Secondary | ICD-10-CM | POA: Diagnosis not present

## 2018-03-17 DIAGNOSIS — Z794 Long term (current) use of insulin: Secondary | ICD-10-CM | POA: Insufficient documentation

## 2018-03-17 DIAGNOSIS — M79602 Pain in left arm: Secondary | ICD-10-CM | POA: Diagnosis present

## 2018-03-17 DIAGNOSIS — M542 Cervicalgia: Secondary | ICD-10-CM | POA: Insufficient documentation

## 2018-03-17 DIAGNOSIS — Z8673 Personal history of transient ischemic attack (TIA), and cerebral infarction without residual deficits: Secondary | ICD-10-CM | POA: Diagnosis not present

## 2018-03-17 DIAGNOSIS — G8929 Other chronic pain: Secondary | ICD-10-CM | POA: Diagnosis not present

## 2018-03-17 DIAGNOSIS — I10 Essential (primary) hypertension: Secondary | ICD-10-CM | POA: Diagnosis not present

## 2018-03-17 DIAGNOSIS — W19XXXA Unspecified fall, initial encounter: Secondary | ICD-10-CM

## 2018-03-17 NOTE — ED Notes (Signed)
Pt called by X-ray tech to go back for X-ray. Pt refused X-ray stating that he, "wanted to talk to the doctor first."

## 2018-03-17 NOTE — Telephone Encounter (Signed)
Pt following up on this med request. Pt states he has not scheduled his surgery yet and is in continued pain. Pt is hoping to get today

## 2018-03-17 NOTE — Telephone Encounter (Signed)
Patient is requesting a refill of the following medications: Requested Prescriptions   Pending Prescriptions Disp Refills  . oxyCODONE-acetaminophen (PERCOCET) 10-325 MG tablet 42 tablet 0    Sig: Take 1 tablet by mouth every 4 (four) hours as needed for pain.    Date of patient request: 03/16/2018 Last office visit: 02/21/2018 Date of last refill: 03/09/2018 Last refill amount: 42 Follow up time period per chart:

## 2018-03-17 NOTE — ED Triage Notes (Signed)
Patient his balance and fell yesterday at home , denies LOC/ambulatory , reports pain at posterior neck /upper back pain radiating to left arm unrelieved by OTC pain medication .

## 2018-03-18 ENCOUNTER — Other Ambulatory Visit: Payer: Self-pay | Admitting: Neurosurgery

## 2018-03-18 ENCOUNTER — Telehealth: Payer: Self-pay | Admitting: Physician Assistant

## 2018-03-18 ENCOUNTER — Emergency Department (HOSPITAL_COMMUNITY): Payer: 59

## 2018-03-18 ENCOUNTER — Ambulatory Visit (INDEPENDENT_AMBULATORY_CARE_PROVIDER_SITE_OTHER)
Admission: EM | Admit: 2018-03-18 | Discharge: 2018-03-18 | Disposition: A | Discharge status: Home / Self Care | Payer: 59 | Source: Home / Self Care | Attending: Family Medicine | Admitting: Family Medicine

## 2018-03-18 ENCOUNTER — Encounter (HOSPITAL_COMMUNITY): Payer: Self-pay

## 2018-03-18 ENCOUNTER — Telehealth: Payer: Self-pay | Admitting: Emergency Medicine

## 2018-03-18 DIAGNOSIS — M542 Cervicalgia: Secondary | ICD-10-CM | POA: Diagnosis not present

## 2018-03-18 DIAGNOSIS — G894 Chronic pain syndrome: Secondary | ICD-10-CM | POA: Diagnosis not present

## 2018-03-18 MED ORDER — DIAZEPAM 5 MG PO TABS: 5.0000 mg | ORAL_TABLET | Freq: Three times a day (TID) | ORAL | 0 refills | Status: DC | PRN

## 2018-03-18 MED ORDER — METHYLPREDNISOLONE 4 MG PO TBPK: ORAL_TABLET | ORAL | 0 refills | Status: DC

## 2018-03-18 MED ORDER — OXYCODONE-ACETAMINOPHEN 5-325 MG PO TABS
2.0000 | ORAL_TABLET | Freq: Once | ORAL | Status: AC
  Administered 2018-03-18: 2 via ORAL
  Filled 2018-03-18: qty 2

## 2018-03-18 MED ORDER — OXYCODONE-ACETAMINOPHEN 10-325 MG PO TABS: 1.0000 | ORAL_TABLET | ORAL | 0 refills | Status: DC | PRN

## 2018-03-18 MED ORDER — DIAZEPAM 5 MG PO TABS
5.0000 mg | ORAL_TABLET | Freq: Once | ORAL | Status: AC
  Administered 2018-03-18: 5 mg via ORAL
  Filled 2018-03-18: qty 1

## 2018-03-18 MED ORDER — OXYCODONE-ACETAMINOPHEN 10-325 MG PO TABS: 1.0000 | ORAL_TABLET | ORAL | 0 refills | Status: AC | PRN

## 2018-03-18 NOTE — ED Provider Notes (Signed)
[facility] EMERGENCY DEPARTMENT Provider Note   CSN: [id number] Arrival date & time: 03/17/18  1955     History   Chief Complaint Chief Complaint  Patient presents with  . Fall    HPI Brandon [name] is a 47 y.o. male with history of ADEM, cervical disc herniation who presents with neck and left arm pain.  Patient reports he is currently seeing neurosurgeon, Dr. Lovell Sheehan, for known cervical disc herniation and scheduling surgery soon.  Patient denies on Percocet and Valium for his symptoms, however has been out of Percocet and he reports when he is not pain medication, he has numbness in his first through third fingers on the left and he becomes more weak than normal.  He reports falling 2 days ago when he had had his Percocet and Valium and felt dizzy.  He did not hit his head or lose consciousness, but caught himself on his left arm.  He has left wrist pain as well.  He denies any chest pain, shortness of breath, abdominal pain, nausea, vomiting.  HPI  Past Medical History:  Diagnosis Date  . ADEM (acute disseminated encephalomyelitis)   . Allergy   . Anxiety 2005  . Complication of anesthesia   . Depression 2005  . Difficult intubation    difficult intubating and exbutating "throat Clinched around tube"  . Headache(784.0)   . Hearing loss   . Hyperinsulinemia 07/04/11   "I'm on metformin cause I produce too much insulin"  . Hyperlipidemia   . Hypertension   . Mononucleosis, infectious, with hepatitis 06/2011  . MS (multiple sclerosis) (HCC)    patinet has ADEM not MS per Patient  . Nausea    occasional per history form 08/18/11  . Neuromuscular disorder (HCC)   . Pneumonia 2010  . Sleep apnea    uses CPAP at home  . Stroke (HCC) 2000   TIA  . TIA (transient ischemic attack) 2000   denies residual  . TIA (transient ischemic attack)     Patient Active Problem List   Diagnosis Date Noted  . Chronic neck pain 02/17/2018  . Other fatigue 09/15/2017   . Shortness of breath on exertion 09/15/2017  . Type 2 diabetes mellitus without complication, without long-term current use of insulin (HCC) 09/15/2017  . Other hyperlipidemia 09/15/2017  . Essential hypertension 09/15/2017  . DDD (degenerative disc disease), cervical 05/03/2017  . Cervical herniated disc 10/21/2016  . Fatty liver 09/22/2016  . Spinal stenosis in cervical region 08/05/2016  . Diabetes type 2, controlled (HCC) 10/15/2015  . Hypertriglyceridemia 10/15/2015  . Benign essential HTN 12/05/2014  . Macroglossia 09/05/2014  . GAD (generalized anxiety disorder) 12/11/2013  . Motor tic disorder 12/11/2013  . OSA on CPAP 10/04/2013  . Hypersomnia with sleep apnea, unspecified 10/04/2013  . High frequency hearing loss 08/24/2013  . Peripheral neuropathy 04/19/2013  . Abnormal gait 10/19/2012  . Cervical myelopathy (HCC) 10/19/2012  . Esophageal reflux 07/27/2011  . Morbid obesity due to excess calories (HCC) 07/22/2011  . ADD (attention deficit disorder) 07/22/2011  . ADEM (acute disseminated encephalomyelitis) 07/03/2011  . Splenomegaly 07/03/2011  . Elevated LFTs 07/03/2011    Past Surgical History:  Procedure Laterality Date  . ANTERIOR CERVICAL DECOMP/DISCECTOMY FUSION N/A 10/21/2016   Procedure: ANTERIOR CERVICAL DECOMPRESSION/DISCECTOMY FUSION, INTERBODY PROSTHESIS,PLATE CERVICAL FOUR- CERVICAL FIVE, CERVICAL FIVE- CERVICAL SIX;  Surgeon: Tressie Stalker, MD;  Location: [facility] OR;  Service: Neurosurgery;  Laterality: N/A;  . CHOLECYSTECTOMY  08/02/2011   Procedure: LAPAROSCOPIC CHOLECYSTECTOMY;  Surgeon: Shelly Rubenstein, MD;  Location: [facility] OR;  Service: General;;  . COLONOSCOPY  2003  . ESOPHAGOGASTRODUODENOSCOPY  2003  . ESOPHAGOGASTRODUODENOSCOPY  07/28/2011   Procedure: ESOPHAGOGASTRODUODENOSCOPY (EGD);  Surgeon: [name], MD;  Location: [facility] ENDOSCOPY;  Service: Endoscopy;  Laterality: N/A;  . MYRINGOTOMY  1979; 1980   bilaterally  . TONSILLECTOMY AND  ADENOIDECTOMY  ~ 1980        Home Medications    Prior to Admission medications   Medication Sig Start Date End Date Taking? Authorizing Provider  atorvastatin (LIPITOR) 20 MG tablet TAKE 1 TABLET (20 MG TOTAL) BY MOUTH AT BEDTIME. 03/10/18   Weber, [name] L, PA-C  azelastine (ASTELIN) 0.1 % nasal spray USE 2 SPRAYS IN EACH NOSTRIL TWICE A DAY 01/07/18   Weber, Dema Severin, PA-C  benzonatate (TESSALON) 100 MG capsule Take 1-2 capsules (100-200 mg total) by mouth 3 (three) times daily as needed for cough. 10/27/17   Porfirio Oar, PA  colesevelam (WELCHOL) 625 MG tablet Take 3 tablets (1,875 mg total) by mouth daily with breakfast. 02/28/18   Valarie Cones, Dema Severin, PA-C  cyclobenzaprine (AMRIX) 15 MG 24 hr capsule Take 2 capsules (30 mg total) by mouth daily. 02/21/18   Weber, Dema Severin, PA-C  cyclobenzaprine (FLEXERIL) 10 MG tablet TAKE 1 TABLET BY ORAL ROUTE 3 TIMES EVERY DAY AS NEEDED FOR MUSCLE SPASMS 02/08/18   [provider]  diazepam (VALIUM) 5 MG tablet Take 1 tablet (5 mg total) by mouth every 8 (eight) hours as needed for muscle spasms. 03/18/18   Okey Zelek, Waylan Boga, PA-C  diclofenac (VOLTAREN) 75 MG EC tablet Take 1 tablet (75 mg total) by mouth 2 (two) times daily. 08/24/17   Weber, Dema Severin, PA-C  gabapentin (NEURONTIN) 300 MG capsule Take 3 capsules (900 mg total) by mouth 2 (two) times daily. 08/24/17   Weber, Dema Severin, PA-C  Guaifenesin (MUCINEX MAXIMUM STRENGTH) 1200 MG TB12 Take 1 tablet (1,200 mg total) by mouth every 12 (twelve) hours as needed. 10/27/17   Porfirio Oar, PA  Insulin Pen Needle (BD PEN NEEDLE NANO U/F) 32G X 4 MM MISC 1 Package by Does not apply route 2 (two) times daily. 01/27/18   Quillian Quince D, MD  liraglutide (VICTOZA) 18 MG/3ML SOPN Inject 0.1 mLs (0.6 mg total) into the skin every morning. 01/27/18   Quillian Quince D, MD  lisinopril-hydrochlorothiazide (PRINZIDE,ZESTORETIC) 10-12.5 MG tablet Take 1 tablet by mouth at bedtime. 0000 08/24/17   Weber, Dema Severin, PA-C    metFORMIN (GLUCOPHAGE-XR) 500 MG 24 hr tablet TAKE 2 TABLETS (1,000 MG TOTAL) BY MOUTH 2 (TWO) TIMES DAILY. 08/24/17   Weber, Dema Severin, PA-C  methylPREDNISolone (MEDROL DOSEPAK) 4 MG TBPK tablet Take as directed on package. 03/18/18   Dreamer Carillo, Waylan Boga, PA-C  NON FORMULARY CPAP machine at bedtime & sleep    [provider]  oxyCODONE-acetaminophen (PERCOCET) 10-325 MG tablet Take 1 tablet by mouth every 4 (four) hours as needed for up to 1 day for pain. 03/18/18 03/19/18  Emi Holes, PA-C  tiZANidine (ZANAFLEX) 4 MG capsule Take 1 capsule (4 mg total) by mouth 3 (three) times daily. 11/19/17   Weber, Dema Severin, PA-C  Vitamin D, Ergocalciferol, (DRISDOL) 50000 units CAPS capsule Take 1 capsule (50,000 Units total) by mouth every 7 (seven) days. 01/27/18   Wilder Glade, MD    Family History Family History  Problem Relation Age of Onset  . Coronary artery disease Father   . Stroke Father   .  Cancer Father   . High blood pressure Father   . Alcoholism Father   . Diabetes Mother   . Obesity Mother   . Cancer Maternal Grandmother        bone  . Cancer Other        Stomach -Great-great grandmother    Social History Social History   Tobacco Use  . Smoking status: Former Smoker    Packs/day: 3.00    Years: 3.00    Pack years: 9.00    Types: Cigarettes    Last attempt to quit: 08/18/1987    Years since quitting: 30.6  . Smokeless tobacco: Never Used  Substance Use Topics  . Alcohol use: Yes    Alcohol/week: 0.0 standard drinks    Comment: 07/04/11 "glass of wine or spirits once a month"  . Drug use: Yes    Types: Marijuana    Comment: have not used in a 1.5 years     Allergies   Penicillin g; Sulfa antibiotics; Amoxicillin; Cephalosporins; Quinolones; and Sulfamethoxazole   Review of Systems Review of Systems  Constitutional: Negative for chills and fever.  HENT: Negative for facial swelling and sore throat.   Respiratory: Negative for shortness of breath.    Cardiovascular: Negative for chest pain.  Gastrointestinal: Negative for abdominal pain, nausea and vomiting.  Genitourinary: Negative for dysuria.  Musculoskeletal: Positive for arthralgias, back pain and neck pain.  Skin: Negative for rash and wound.  Neurological: Negative for headaches.  Psychiatric/Behavioral: The patient is not nervous/anxious.      Physical Exam Updated Vital Signs BP (!) 168/75 (BP Location: Left Wrist)   Pulse 87   Temp 98 F (36.7 C) (Oral)   Resp 16   Ht 5\' 8"  (1.727 m)   Wt (!) 140.6 kg   SpO2 100%   BMI 47.14 kg/m   Physical Exam  Constitutional: He appears well-developed and well-nourished. No distress.  HENT:  Head: Normocephalic and atraumatic.  Mouth/Throat: Oropharynx is clear and moist. No oropharyngeal exudate.  Eyes: Pupils are equal, round, and reactive to light. Conjunctivae are normal. Right eye exhibits no discharge. Left eye exhibits no discharge. No scleral icterus.  Neck: Normal range of motion. Neck supple. No thyromegaly present.  Cardiovascular: Normal rate, regular rhythm, normal heart sounds and intact distal pulses. Exam reveals no gallop and no friction rub.  No murmur heard. Pulmonary/Chest: Effort normal and breath sounds normal. No stridor. No respiratory distress. He has no wheezes. He has no rales.  Abdominal: Soft. Bowel sounds are normal. He exhibits no distension. There is no tenderness. There is no rebound and no guarding.  Musculoskeletal: He exhibits no edema.       Back:  4/5 strength of the left upper extremity with weakened grip strength on the left as well; decreased sensation to the first through third digits on the left Tenderness to the left wrist, elbow, and shoulder  Lymphadenopathy:    He has no cervical adenopathy.  Neurological: He is alert. Coordination normal.  Skin: Skin is warm and dry. No rash noted. He is not diaphoretic. No pallor.  Psychiatric: He has a normal mood and affect.  Nursing  note and vitals reviewed.    ED Treatments / Results  Labs (all labs ordered are listed, but only abnormal results are displayed) Labs Reviewed - No data to display  EKG None  Radiology Dg Elbow Complete Left  Result Date: 03/18/2018 CLINICAL DATA:  Left elbow pain after fall. EXAM: LEFT ELBOW - COMPLETE  3+ VIEW COMPARISON:  None. FINDINGS: There is no evidence of fracture, dislocation, or joint effusion. There is no evidence of arthropathy or other focal bone abnormality. Soft tissues are unremarkable. IMPRESSION: Negative radiographs of the left elbow. Electronically Signed   By: Narda Rutherford M.D.   On: 03/18/2018 06:47   Dg Wrist Complete Left  Result Date: 03/18/2018 CLINICAL DATA:  Larey Seat inside his house 2 days ago, posterior LEFT wrist pain EXAM: LEFT WRIST - COMPLETE 3+ VIEW COMPARISON:  None FINDINGS: Osseous mineralization normal. Joint spaces preserved. No acute fracture, dislocation, or bone destruction. IMPRESSION: Normal exam. Electronically Signed   By: Ulyses Southward M.D.   On: 03/18/2018 03:11   Ct Cervical Spine Wo Contrast  Result Date: 03/18/2018 CLINICAL DATA:  47 y/o M; imbalance and fall. Patient reports posterior neck and upper back pain radiating to the left arm. EXAM: CT CERVICAL SPINE WITHOUT CONTRAST CT THORACIC SPINE WITHOUT CONTRAST TECHNIQUE: Multidetector CT imaging of the cervical and thoracic spine was performed without contrast. Multiplanar CT image reconstructions were also generated. COMPARISON:  03/04/2018 cervical spine MRI. 02/21/2018 cervical spine radiographs. 10/27/2017 chest radiograph. FINDINGS: CT CERVICAL SPINE FINDINGS Alignment: Straightening of cervical lordosis. Stable slight grade 1 C2-3 anterolisthesis. Skull base and vertebrae: C4-C6 anterior cervical discectomy and fusion. No periprosthetic lucency or fracture identified. No acute fracture, primary bone lesion, or focal pathologic process. Soft tissues and spinal canal: No prevertebral  fluid or swelling. No visible canal hematoma. Disc levels: Prominent C5-6 endplate marginal osteophytes and uncovertebral hypertrophy results in bony foraminal encroachment bilaterally. No high-grade bony canal stenosis. Upper chest: Negative. Other: Negative. CT THORACIC SPINE FINDINGS Alignment: Mild upper thoracic dextrocurvature. Normal thoracic kyphosis. No listhesis. Vertebrae: No acute fracture or focal pathologic process. Paraspinal and other soft tissues: Negative. Disc levels: Multilevel mild-to-moderate discogenic degenerative changes with loss of intervertebral disc spaces and endplate marginal osteophytes. Prominent endplate marginal osteophytes at the T3-4 level with mild bony canal stenosis. IMPRESSION: 1. No acute fracture or dislocation identified. 2. C4-C6 anterior cervical discectomy and fusion. No apparent hardware related complication. 3. Mild-to-moderate multilevel disc and facet degenerative changes of the cervical and thoracic spine. No high-grade bony canal stenosis. 4. C5-6 uncovertebral and facet hypertrophy with bony foraminal stenosis. Electronically Signed   By: Mitzi Hansen M.D.   On: 03/18/2018 04:18   Ct Thoracic Spine Wo Contrast  Result Date: 03/18/2018 CLINICAL DATA:  47 y/o M; imbalance and fall. Patient reports posterior neck and upper back pain radiating to the left arm. EXAM: CT CERVICAL SPINE WITHOUT CONTRAST CT THORACIC SPINE WITHOUT CONTRAST TECHNIQUE: Multidetector CT imaging of the cervical and thoracic spine was performed without contrast. Multiplanar CT image reconstructions were also generated. COMPARISON:  03/04/2018 cervical spine MRI. 02/21/2018 cervical spine radiographs. 10/27/2017 chest radiograph. FINDINGS: CT CERVICAL SPINE FINDINGS Alignment: Straightening of cervical lordosis. Stable slight grade 1 C2-3 anterolisthesis. Skull base and vertebrae: C4-C6 anterior cervical discectomy and fusion. No periprosthetic lucency or fracture identified. No  acute fracture, primary bone lesion, or focal pathologic process. Soft tissues and spinal canal: No prevertebral fluid or swelling. No visible canal hematoma. Disc levels: Prominent C5-6 endplate marginal osteophytes and uncovertebral hypertrophy results in bony foraminal encroachment bilaterally. No high-grade bony canal stenosis. Upper chest: Negative. Other: Negative. CT THORACIC SPINE FINDINGS Alignment: Mild upper thoracic dextrocurvature. Normal thoracic kyphosis. No listhesis. Vertebrae: No acute fracture or focal pathologic process. Paraspinal and other soft tissues: Negative. Disc levels: Multilevel mild-to-moderate discogenic degenerative changes with loss of intervertebral disc  spaces and endplate marginal osteophytes. Prominent endplate marginal osteophytes at the T3-4 level with mild bony canal stenosis. IMPRESSION: 1. No acute fracture or dislocation identified. 2. C4-C6 anterior cervical discectomy and fusion. No apparent hardware related complication. 3. Mild-to-moderate multilevel disc and facet degenerative changes of the cervical and thoracic spine. No high-grade bony canal stenosis. 4. C5-6 uncovertebral and facet hypertrophy with bony foraminal stenosis. Electronically Signed   By: Mitzi Hansen M.D.   On: 03/18/2018 04:18   Dg Shoulder Left  Result Date: 03/18/2018 CLINICAL DATA:  Left shoulder and elbow pain after fall. EXAM: LEFT SHOULDER - 2+ VIEW COMPARISON:  None. FINDINGS: There is no evidence of fracture or dislocation. Trace degenerative acromioclavicular spurring. There is no evidence of arthropathy or other focal bone abnormality. Soft tissues are unremarkable. IMPRESSION: No fracture or dislocation of the left shoulder. Electronically Signed   By: Narda Rutherford M.D.   On: 03/18/2018 06:47    Procedures Procedures (including critical care time)  Medications Ordered in ED Medications  oxyCODONE-acetaminophen (PERCOCET/ROXICET) 5-325 MG per tablet 2 tablet (2  tablets Oral Given 03/18/18 0424)  diazepam (VALIUM) tablet 5 mg (5 mg Oral Given 03/18/18 0425)     Initial Impression / Assessment and Plan / ED Course  I have reviewed the triage vital signs and the nursing notes.  Pertinent labs & imaging results that were available during my care of the patient were reviewed by me and considered in my medical decision making (see chart for details).     Patient presenting with ongoing left arm pain and weakness with known disc protrusion in the cervical spine.  Patient has been out of his pain medication.  He is found to be weak on the left side, although patient's exam is cloudy considering his ADEM and lack of pain control.  After pain control in the ED with Percocet 10 mg, and Valium 5 mg, patient states he is back at his baseline.  CT C-spine, thoracic spine, x-rays of the left wrist, elbow, and shoulder, are stable.  There are no new findings.  Do not feel patient needs repeat MRI, as patient feels that his weakness and numbness are what he is been dealing with at baseline lately.  He plans to schedule surgery with neurosurgeon, Dr. Lovell Sheehan, soon.  Will give patient a 1 day supply of Percocet 10 mg and Valium 5 mg until he can have his PCP refill. We will also discharge home with Medrol Dosepak. I reviewed the [place] narcotic database and found no discrepancies.  Strict return precautions given.  Patient understands and agrees with plan.  Patient vitals stable throughout ED course and discharged in satisfactory condition.  Patient also evaluated by Dr. Manus Gunning who guided the patient's management and agrees with plan.  Final Clinical Impressions(s) / ED Diagnoses   Final diagnoses:  Fall, initial encounter  Chronic neck pain    ED Discharge Orders         Ordered    oxyCODONE-acetaminophen (PERCOCET) 10-325 MG tablet  Every 4 hours PRN     03/18/18 0659    diazepam (VALIUM) 5 MG tablet  Every 8 hours PRN     03/18/18 0659    methylPREDNISolone (MEDROL  DOSEPAK) 4 MG TBPK tablet     03/18/18 0659           Emi Holes, PA-C 03/18/18 1646    Glynn Octave, MD 03/20/18 [id number]

## 2018-03-18 NOTE — ED Notes (Signed)
Pt ambulatory to and from hallway bathroom with only the use of his cane and standby assistance.

## 2018-03-18 NOTE — Telephone Encounter (Signed)
Copied from CRM [id number]. Topic: General - Other >> Mar 18, 2018 12:29 PM Burchel, Abbi R wrote: Reason for CRM:   Pt has TOC appt on 03/25/18 with Dr Alvy Bimler, but is concerned that he only has a 5 day rx for pain medication for his ruptured disk.  Pt would like to confirm that Dr Alvy Bimler can refill his pain meds until his appt on 9/20. Please call pt to advise.   Pt: [phone]

## 2018-03-18 NOTE — ED Notes (Signed)
Patient up walking around using a cane

## 2018-03-18 NOTE — ED Notes (Signed)
Pt walking around the nurse's station with personal cane and a steady gait.

## 2018-03-18 NOTE — Telephone Encounter (Unsigned)
Copied from CRM #[id number]. Topic: Quick Communication - See Telephone Encounter >> Mar 18, 2018  6:07 PM Raquel Sarna wrote: Lazarus Gowda - Can speak about health of pt - is on DPR  Needing someone to call her back regarding pt and pain -oxyCODONE-acetaminophen (PERCOCET) 10-325 MG tablet and diazepam (VALIUM) 5 MG tablet. Pt is wanting a call back to discuss ASAP.

## 2018-03-18 NOTE — Telephone Encounter (Signed)
Pt's sgo is calling back to check on the status of this request.  He will be out this weekend and the request was put on the 11th.  Please advise

## 2018-03-18 NOTE — ED Notes (Signed)
Transported to xray with wheelchair

## 2018-03-18 NOTE — ED Provider Notes (Signed)
[facility]    CSN: [id number] Arrival date & time: 03/18/18  1804     History   Chief Complaint Chief Complaint  Patient presents with  . Medication Refill    HPI Brandon [name] is a 47 y.o. male.   Patient has a history of cervical disc disease having had multiple surgeries.  There is also a history of a DEM.  He is currently seeing neurosurgeon Dr. Lovell Sheehan for cervical disc herniation.  He has been taking Percocet and Valium but is out of medicine and requesting a refill tonight.  Review of the database shows no suspicious activity and his pain would appear to be legitimate.  HPI  Past Medical History:  Diagnosis Date  . ADEM (acute disseminated encephalomyelitis)   . Allergy   . Anxiety 2005  . Complication of anesthesia   . Depression 2005  . Difficult intubation    difficult intubating and exbutating "throat Clinched around tube"  . Headache(784.0)   . Hearing loss   . Hyperinsulinemia 07/04/11   "I'm on metformin cause I produce too much insulin"  . Hyperlipidemia   . Hypertension   . Mononucleosis, infectious, with hepatitis 06/2011  . MS (multiple sclerosis) (HCC)    patinet has ADEM not MS per Patient  . Nausea    occasional per history form 08/18/11  . Neuromuscular disorder (HCC)   . Pneumonia 2010  . Sleep apnea    uses CPAP at home  . Stroke (HCC) 2000   TIA  . TIA (transient ischemic attack) 2000   denies residual  . TIA (transient ischemic attack)     Patient Active Problem List   Diagnosis Date Noted  . Chronic neck pain 02/17/2018  . Other fatigue 09/15/2017  . Shortness of breath on exertion 09/15/2017  . Type 2 diabetes mellitus without complication, without long-term current use of insulin (HCC) 09/15/2017  . Other hyperlipidemia 09/15/2017  . Essential hypertension 09/15/2017  . DDD (degenerative disc disease), cervical 05/03/2017  . Cervical herniated disc 10/21/2016  . Fatty liver 09/22/2016  . Spinal stenosis in  cervical region 08/05/2016  . Diabetes type 2, controlled (HCC) 10/15/2015  . Hypertriglyceridemia 10/15/2015  . Benign essential HTN 12/05/2014  . Macroglossia 09/05/2014  . GAD (generalized anxiety disorder) 12/11/2013  . Motor tic disorder 12/11/2013  . OSA on CPAP 10/04/2013  . Hypersomnia with sleep apnea, unspecified 10/04/2013  . High frequency hearing loss 08/24/2013  . Peripheral neuropathy 04/19/2013  . Abnormal gait 10/19/2012  . Cervical myelopathy (HCC) 10/19/2012  . Esophageal reflux 07/27/2011  . Morbid obesity due to excess calories (HCC) 07/22/2011  . ADD (attention deficit disorder) 07/22/2011  . ADEM (acute disseminated encephalomyelitis) 07/03/2011  . Splenomegaly 07/03/2011  . Elevated LFTs 07/03/2011    Past Surgical History:  Procedure Laterality Date  . ANTERIOR CERVICAL DECOMP/DISCECTOMY FUSION N/A 10/21/2016   Procedure: ANTERIOR CERVICAL DECOMPRESSION/DISCECTOMY FUSION, INTERBODY PROSTHESIS,PLATE CERVICAL FOUR- CERVICAL FIVE, CERVICAL FIVE- CERVICAL SIX;  Surgeon: Tressie Stalker, MD;  Location: [facility];  Service: Neurosurgery;  Laterality: N/A;  . CHOLECYSTECTOMY  08/02/2011   Procedure: LAPAROSCOPIC CHOLECYSTECTOMY;  Surgeon: Shelly Rubenstein, MD;  Location: [facility];  Service: General;;  . COLONOSCOPY  2003  . ESOPHAGOGASTRODUODENOSCOPY  2003  . ESOPHAGOGASTRODUODENOSCOPY  07/28/2011   Procedure: ESOPHAGOGASTRODUODENOSCOPY (EGD);  Surgeon: [name], MD;  Location: [facility] ENDOSCOPY;  Service: Endoscopy;  Laterality: N/A;  . MYRINGOTOMY  1979; 1980   bilaterally  . TONSILLECTOMY AND ADENOIDECTOMY  ~ 1980  Home Medications    Prior to Admission medications   Medication Sig Start Date End Date Taking? Authorizing Provider  atorvastatin (LIPITOR) 20 MG tablet TAKE 1 TABLET (20 MG TOTAL) BY MOUTH AT BEDTIME. 03/10/18   Weber, [name] L, PA-C  azelastine (ASTELIN) 0.1 % nasal spray USE 2 SPRAYS IN EACH NOSTRIL TWICE A DAY 01/07/18   Weber, Dema Severin, PA-C    benzonatate (TESSALON) 100 MG capsule Take 1-2 capsules (100-200 mg total) by mouth 3 (three) times daily as needed for cough. 10/27/17   Porfirio Oar, PA  colesevelam (WELCHOL) 625 MG tablet Take 3 tablets (1,875 mg total) by mouth daily with breakfast. 02/28/18   Valarie Cones, Dema Severin, PA-C  cyclobenzaprine (AMRIX) 15 MG 24 hr capsule Take 2 capsules (30 mg total) by mouth daily. 02/21/18   Weber, Dema Severin, PA-C  cyclobenzaprine (FLEXERIL) 10 MG tablet TAKE 1 TABLET BY ORAL ROUTE 3 TIMES EVERY DAY AS NEEDED FOR MUSCLE SPASMS 02/08/18   [provider]  diazepam (VALIUM) 5 MG tablet Take 1 tablet (5 mg total) by mouth every 8 (eight) hours as needed for muscle spasms. 03/18/18   Law, Waylan Boga, PA-C  diclofenac (VOLTAREN) 75 MG EC tablet Take 1 tablet (75 mg total) by mouth 2 (two) times daily. 08/24/17   Weber, Dema Severin, PA-C  gabapentin (NEURONTIN) 300 MG capsule Take 3 capsules (900 mg total) by mouth 2 (two) times daily. 08/24/17   Weber, Dema Severin, PA-C  Guaifenesin (MUCINEX MAXIMUM STRENGTH) 1200 MG TB12 Take 1 tablet (1,200 mg total) by mouth every 12 (twelve) hours as needed. 10/27/17   Porfirio Oar, PA  Insulin Pen Needle (BD PEN NEEDLE NANO U/F) 32G X 4 MM MISC 1 Package by Does not apply route 2 (two) times daily. 01/27/18   Quillian Quince D, MD  liraglutide (VICTOZA) 18 MG/3ML SOPN Inject 0.1 mLs (0.6 mg total) into the skin every morning. 01/27/18   Quillian Quince D, MD  lisinopril-hydrochlorothiazide (PRINZIDE,ZESTORETIC) 10-12.5 MG tablet Take 1 tablet by mouth at bedtime. 0000 08/24/17   Weber, Dema Severin, PA-C  metFORMIN (GLUCOPHAGE-XR) 500 MG 24 hr tablet TAKE 2 TABLETS (1,000 MG TOTAL) BY MOUTH 2 (TWO) TIMES DAILY. 08/24/17   Weber, Dema Severin, PA-C  methylPREDNISolone (MEDROL DOSEPAK) 4 MG TBPK tablet Take as directed on package. 03/18/18   Law, Waylan Boga, PA-C  NON FORMULARY CPAP machine at bedtime & sleep    [provider]  oxyCODONE-acetaminophen (PERCOCET) 10-325 MG tablet Take  1 tablet by mouth every 4 (four) hours as needed for up to 1 day for pain. 03/18/18 03/19/18  Emi Holes, PA-C  tiZANidine (ZANAFLEX) 4 MG capsule Take 1 capsule (4 mg total) by mouth 3 (three) times daily. 11/19/17   Weber, Dema Severin, PA-C  Vitamin D, Ergocalciferol, (DRISDOL) 50000 units CAPS capsule Take 1 capsule (50,000 Units total) by mouth every 7 (seven) days. 01/27/18   Wilder Glade, MD    Family History Family History  Problem Relation Age of Onset  . Coronary artery disease Father   . Stroke Father   . Cancer Father   . High blood pressure Father   . Alcoholism Father   . Diabetes Mother   . Obesity Mother   . Cancer Maternal Grandmother        bone  . Cancer Other        Stomach -Great-great grandmother    Social History Social History   Tobacco Use  . Smoking status: Former Smoker  Packs/day: 3.00    Years: 3.00    Pack years: 9.00    Types: Cigarettes    Last attempt to quit: 08/18/1987    Years since quitting: 30.6  . Smokeless tobacco: Never Used  Substance Use Topics  . Alcohol use: Yes    Alcohol/week: 0.0 standard drinks    Comment: 07/04/11 "glass of wine or spirits once a month"  . Drug use: Yes    Types: Marijuana    Comment: have not used in a 1.5 years     Allergies   Penicillin g; Sulfa antibiotics; Amoxicillin; Cephalosporins; Quinolones; and Sulfamethoxazole   Review of Systems Review of Systems  Constitutional: Negative.   HENT: Negative.   Respiratory: Negative.   Cardiovascular: Negative.   Gastrointestinal: Negative.   Genitourinary: Negative.   Musculoskeletal: Negative.   Neurological: Negative.   Psychiatric/Behavioral: Negative.      Physical Exam Triage Vital Signs ED Triage Vitals  Enc Vitals Group     BP 03/18/18 1831 (!) 150/112     Pulse Rate 03/18/18 1831 90     Resp 03/18/18 1831 18     Temp 03/18/18 1831 98 F (36.7 C)     Temp src --      SpO2 03/18/18 1831 95 %     Weight --      Height --       Head Circumference --      Peak Flow --      Pain Score 03/18/18 1830 10     Pain Loc --      Pain Edu? --      Excl. in GC? --    No data found.  Updated Vital Signs BP (!) 150/112   Pulse 90   Temp 98 F (36.7 C)   Resp 18   SpO2 95%   Visual Acuity Right Eye Distance:   Left Eye Distance:   Bilateral Distance:    Right Eye Near:   Left Eye Near:    Bilateral Near:     Physical Exam  Constitutional: He is oriented to person, place, and time. He appears well-developed and well-nourished.  Eyes: Pupils are equal, round, and reactive to light.  Cardiovascular: Normal rate.  Pulmonary/Chest: Effort normal.  Musculoskeletal: Normal range of motion.  Neurological: He is alert and oriented to person, place, and time. No sensory deficit. He exhibits normal muscle tone.  Psychiatric: He has a normal mood and affect. His behavior is normal.     UC Treatments / Results  Labs (all labs ordered are listed, but only abnormal results are displayed) Labs Reviewed - No data to display  EKG None  Radiology Dg Elbow Complete Left  Result Date: 03/18/2018 CLINICAL DATA:  Left elbow pain after fall. EXAM: LEFT ELBOW - COMPLETE 3+ VIEW COMPARISON:  None. FINDINGS: There is no evidence of fracture, dislocation, or joint effusion. There is no evidence of arthropathy or other focal bone abnormality. Soft tissues are unremarkable. IMPRESSION: Negative radiographs of the left elbow. Electronically Signed   By: Narda Rutherford M.D.   On: 03/18/2018 06:47   Dg Wrist Complete Left  Result Date: 03/18/2018 CLINICAL DATA:  Larey Seat inside his house 2 days ago, posterior LEFT wrist pain EXAM: LEFT WRIST - COMPLETE 3+ VIEW COMPARISON:  None FINDINGS: Osseous mineralization normal. Joint spaces preserved. No acute fracture, dislocation, or bone destruction. IMPRESSION: Normal exam. Electronically Signed   By: Ulyses Southward M.D.   On: 03/18/2018 03:11   Ct Cervical  Spine Wo Contrast  Result Date:  03/18/2018 CLINICAL DATA:  47 y/o M; imbalance and fall. Patient reports posterior neck and upper back pain radiating to the left arm. EXAM: CT CERVICAL SPINE WITHOUT CONTRAST CT THORACIC SPINE WITHOUT CONTRAST TECHNIQUE: Multidetector CT imaging of the cervical and thoracic spine was performed without contrast. Multiplanar CT image reconstructions were also generated. COMPARISON:  03/04/2018 cervical spine MRI. 02/21/2018 cervical spine radiographs. 10/27/2017 chest radiograph. FINDINGS: CT CERVICAL SPINE FINDINGS Alignment: Straightening of cervical lordosis. Stable slight grade 1 C2-3 anterolisthesis. Skull base and vertebrae: C4-C6 anterior cervical discectomy and fusion. No periprosthetic lucency or fracture identified. No acute fracture, primary bone lesion, or focal pathologic process. Soft tissues and spinal canal: No prevertebral fluid or swelling. No visible canal hematoma. Disc levels: Prominent C5-6 endplate marginal osteophytes and uncovertebral hypertrophy results in bony foraminal encroachment bilaterally. No high-grade bony canal stenosis. Upper chest: Negative. Other: Negative. CT THORACIC SPINE FINDINGS Alignment: Mild upper thoracic dextrocurvature. Normal thoracic kyphosis. No listhesis. Vertebrae: No acute fracture or focal pathologic process. Paraspinal and other soft tissues: Negative. Disc levels: Multilevel mild-to-moderate discogenic degenerative changes with loss of intervertebral disc spaces and endplate marginal osteophytes. Prominent endplate marginal osteophytes at the T3-4 level with mild bony canal stenosis. IMPRESSION: 1. No acute fracture or dislocation identified. 2. C4-C6 anterior cervical discectomy and fusion. No apparent hardware related complication. 3. Mild-to-moderate multilevel disc and facet degenerative changes of the cervical and thoracic spine. No high-grade bony canal stenosis. 4. C5-6 uncovertebral and facet hypertrophy with bony foraminal stenosis. Electronically  Signed   By: Mitzi Hansen M.D.   On: 03/18/2018 04:18   Ct Thoracic Spine Wo Contrast  Result Date: 03/18/2018 CLINICAL DATA:  47 y/o M; imbalance and fall. Patient reports posterior neck and upper back pain radiating to the left arm. EXAM: CT CERVICAL SPINE WITHOUT CONTRAST CT THORACIC SPINE WITHOUT CONTRAST TECHNIQUE: Multidetector CT imaging of the cervical and thoracic spine was performed without contrast. Multiplanar CT image reconstructions were also generated. COMPARISON:  03/04/2018 cervical spine MRI. 02/21/2018 cervical spine radiographs. 10/27/2017 chest radiograph. FINDINGS: CT CERVICAL SPINE FINDINGS Alignment: Straightening of cervical lordosis. Stable slight grade 1 C2-3 anterolisthesis. Skull base and vertebrae: C4-C6 anterior cervical discectomy and fusion. No periprosthetic lucency or fracture identified. No acute fracture, primary bone lesion, or focal pathologic process. Soft tissues and spinal canal: No prevertebral fluid or swelling. No visible canal hematoma. Disc levels: Prominent C5-6 endplate marginal osteophytes and uncovertebral hypertrophy results in bony foraminal encroachment bilaterally. No high-grade bony canal stenosis. Upper chest: Negative. Other: Negative. CT THORACIC SPINE FINDINGS Alignment: Mild upper thoracic dextrocurvature. Normal thoracic kyphosis. No listhesis. Vertebrae: No acute fracture or focal pathologic process. Paraspinal and other soft tissues: Negative. Disc levels: Multilevel mild-to-moderate discogenic degenerative changes with loss of intervertebral disc spaces and endplate marginal osteophytes. Prominent endplate marginal osteophytes at the T3-4 level with mild bony canal stenosis. IMPRESSION: 1. No acute fracture or dislocation identified. 2. C4-C6 anterior cervical discectomy and fusion. No apparent hardware related complication. 3. Mild-to-moderate multilevel disc and facet degenerative changes of the cervical and thoracic spine. No  high-grade bony canal stenosis. 4. C5-6 uncovertebral and facet hypertrophy with bony foraminal stenosis. Electronically Signed   By: Mitzi Hansen M.D.   On: 03/18/2018 04:18   Dg Shoulder Left  Result Date: 03/18/2018 CLINICAL DATA:  Left shoulder and elbow pain after fall. EXAM: LEFT SHOULDER - 2+ VIEW COMPARISON:  None. FINDINGS: There is no evidence of fracture or dislocation. Trace degenerative  acromioclavicular spurring. There is no evidence of arthropathy or other focal bone abnormality. Soft tissues are unremarkable. IMPRESSION: No fracture or dislocation of the left shoulder. Electronically Signed   By: Narda Rutherford M.D.   On: 03/18/2018 06:47    Procedures Procedures (including critical care time)  Medications Ordered in UC Medications - No data to display  Initial Impression / Assessment and Plan / UC Course  I have reviewed the triage vital signs and the nursing notes.  Pertinent labs & imaging results that were available during my care of the patient were reviewed by me and considered in my medical decision making (see chart for details).     Cervical disc disease.  We will refill pain medicine to get him through the weekend and have him follow-up with neurosurgeon or primary care first of the week Final Clinical Impressions(s) / UC Diagnoses   Final diagnoses:  None   Discharge Instructions   None    ED Prescriptions    None     Controlled Substance Prescriptions [place] Controlled Substance Registry consulted? Yes, I have consulted the [place] Controlled Substances Registry for this patient, and feel the risk/benefit ratio today is favorable for proceeding with this prescription for a controlled substance.   Frederica Kuster, MD 03/18/18 [phone]

## 2018-03-18 NOTE — ED Notes (Signed)
ED Provider at bedside. 

## 2018-03-18 NOTE — Discharge Instructions (Signed)
Take medication as prescribed.  Do not drive or operate machinery while taking these medications.  Please follow-up with your doctor for further prescriptions of these.  Please follow-up with Dr. Lovell Sheehan as planned.  Please return the emergency department if you develop any new or worsening symptoms.

## 2018-03-18 NOTE — ED Notes (Signed)
Patient laying on his right side on the stretcher sleeping .

## 2018-03-18 NOTE — Telephone Encounter (Signed)
Copied from CRM #[id number]. Topic: Quick Communication - See Telephone Encounter °>> Mar 18, 2018  6:07 PM Hayes, Teresa G wrote: °Jessica Nelson - Can speak about health of pt - is on DPR  °Needing someone to call her back regarding pt and pain -oxyCODONE-acetaminophen (PERCOCET) 10-325 MG tablet and diazepam (VALIUM) 5 MG tablet. °Pt is wanting a call back to discuss ASAP. °

## 2018-03-18 NOTE — Telephone Encounter (Signed)
Lazarus Gowda and the patient calling together and states that they are needing this taken care of today. States that they are aware that [name]'s last day was yesterday. They are worried about the patient running out before this will get filled. States that they are needing the valium and the oxycodone. Patient is in a lot of pain. Per Shanda Bumps "this is a crippling, knock you down to the ground kind of pain." Very insistent that this get taken care of today. States that she will call every hour if she has to to get the ball moving on this. Please advise.

## 2018-03-18 NOTE — Telephone Encounter (Signed)
Pt requested refill of Oxycodone and Valium.  States he has previously been prescribed these by Benny Lennert, PA.  Was seen in ED this morning and given scripts but feels that he will need more. Pt arrived at office at time of closing, unable to fit in for appt and unable to refill without appt.  Pt is very concerned that he will not be able to get medication.  Has scheduled appt with Sag[facility] to transfer care but concerned that previous requests for this refill has not yet been answered.  Routing to you as pt between providers and has previous requests have been unanswered.

## 2018-03-18 NOTE — ED Triage Notes (Signed)
Pt presents with complaints of pain in his neck. Reports that he is out of his pain medication.

## 2018-03-18 NOTE — Telephone Encounter (Signed)
[facility] did not receive refill on diazepam, predisione and oxycododone, states its urgent they are resent.

## 2018-03-20 ENCOUNTER — Encounter: Payer: Self-pay | Admitting: Neurology

## 2018-03-20 ENCOUNTER — Encounter: Payer: Self-pay | Admitting: Physician Assistant

## 2018-03-20 MED ORDER — DIAZEPAM 5 MG PO TABS: 5.0000 mg | ORAL_TABLET | Freq: Three times a day (TID) | ORAL | 0 refills | Status: DC | PRN

## 2018-03-20 MED ORDER — OXYCODONE-ACETAMINOPHEN 10-325 MG PO TABS: 1.0000 | ORAL_TABLET | ORAL | 0 refills | Status: DC | PRN

## 2018-03-20 NOTE — Telephone Encounter (Signed)
This has already been filled.

## 2018-03-20 NOTE — Telephone Encounter (Signed)
I have given him medications through his surgery on 9/30 - his surgeon will need to give him medications for the time after that.

## 2018-03-21 ENCOUNTER — Ambulatory Visit: Payer: 59 | Admitting: Physician Assistant

## 2018-03-22 ENCOUNTER — Telehealth: Payer: Self-pay | Admitting: *Deleted

## 2018-03-22 NOTE — Telephone Encounter (Signed)
PA Weber just refilled his chronic pain medication. If he has an appointment to see me 9/20, I'll see him then. Thanks.

## 2018-03-22 NOTE — Telephone Encounter (Signed)
Please see note below. 

## 2018-03-22 NOTE — Telephone Encounter (Signed)
Left message in mobile voice mail the pain medication refill request cannot be refill. The message from Benny Lennert PA-C note, she stated she will refilled 15 days of medication until the surgery date 04/04/2018.

## 2018-03-25 ENCOUNTER — Ambulatory Visit: Payer: 59 | Admitting: Emergency Medicine

## 2018-03-25 ENCOUNTER — Encounter: Payer: Self-pay | Admitting: Emergency Medicine

## 2018-03-25 ENCOUNTER — Other Ambulatory Visit: Payer: Self-pay

## 2018-03-25 VITALS — BP 127/83 | HR 80 | Temp 98.5°F | Resp 18 | Ht 68.0 in | Wt 305.0 lb

## 2018-03-25 DIAGNOSIS — M5010 Cervical disc disorder with radiculopathy, unspecified cervical region: Secondary | ICD-10-CM | POA: Diagnosis not present

## 2018-03-25 DIAGNOSIS — M542 Cervicalgia: Secondary | ICD-10-CM

## 2018-03-25 DIAGNOSIS — M62838 Other muscle spasm: Secondary | ICD-10-CM | POA: Diagnosis not present

## 2018-03-25 DIAGNOSIS — E161 Other hypoglycemia: Secondary | ICD-10-CM

## 2018-03-25 DIAGNOSIS — G8929 Other chronic pain: Secondary | ICD-10-CM

## 2018-03-25 MED ORDER — METFORMIN HCL ER 500 MG PO TB24: ORAL_TABLET | ORAL | 1 refills | Status: AC

## 2018-03-25 MED ORDER — DIAZEPAM 5 MG PO TABS: 5.0000 mg | ORAL_TABLET | Freq: Three times a day (TID) | ORAL | 0 refills | Status: DC | PRN

## 2018-03-25 NOTE — Patient Instructions (Addendum)
     If you have lab work done today you will be contacted with your lab results within the next 2 weeks.  If you have not heard from us then please contact us. The fastest way to get your results is to register for My Chart.   IF you received an x-ray today, you will receive an invoice from [facility]. Please contact [facility] at [phone] with questions or concerns regarding your invoice.   IF you received labwork today, you will receive an invoice from [facility]. Please contact [facility] at [phone] with questions or concerns regarding your invoice.   Our billing staff will not be able to assist you with questions regarding bills from these companies.  You will be contacted with the lab results as soon as they are available. The fastest way to get your results is to activate your My Chart account. Instructions are located on the last page of this paperwork. If you have not heard from us regarding the results in 2 weeks, please contact this office.     Cervical Radiculopathy Cervical radiculopathy means that a nerve in the neck is pinched or bruised. This can cause pain or loss of feeling (numbness) that runs from your neck to your arm and fingers. Follow these instructions at home: Managing pain  Take over-the-counter and prescription medicines only as told by your doctor.  If directed, put ice on the injured or painful area. ? Put ice in a plastic bag. ? Place a towel between your skin and the bag. ? Leave the ice on for 20 minutes, 2-3 times per day.  If ice does not help, you can try using heat. Take a warm shower or warm bath, or use a heat pack as told by your doctor.  You may try a gentle neck and shoulder massage. Activity  Rest as needed. Follow instructions from your doctor about any activities to avoid.  Do exercises as told by your doctor or physical therapist. General instructions  If you were given a soft collar, wear it as told by your  doctor.  Use a flat pillow when you sleep.  Keep all follow-up visits as told by your doctor. This is important. Contact a doctor if:  Your condition does not improve with treatment. Get help right away if:  Your pain gets worse and is not controlled with medicine.  You lose feeling or feel weak in your hand, arm, face, or leg.  You have a fever.  You have a stiff neck.  You cannot control when you poop or pee (have incontinence).  You have trouble with walking, balance, or talking. This information is not intended to replace advice given to you by your health care provider. Make sure you discuss any questions you have with your health care provider. Document Released: 06/11/2011 Document Revised: 11/28/2015 Document Reviewed: 08/16/2014 Elsevier Interactive Patient Education  2018 [employer]   

## 2018-03-25 NOTE — Progress Notes (Signed)
Brandon [name] 47 y.o.   Chief Complaint  Patient presents with  . Establish Care    pt transfer from [facility]   . Medication Refill    HISTORY OF PRESENT ILLNESS: This is a 47 y.o. male with history of cervical disc disease and chronic neck pain scheduled for surgery sometime in the next 2 months.  Has an appointment to see PA Weber at her new location next month but needs medication to hold him until then.  Has enough OxyContin for the rest of this month but is out of diazepam. Mr Cervical Spine Wo Contrast  Result Date: 03/04/2018 CLINICAL DATA:  Initial evaluation for neck pain extending into the left shoulder and upper extremity for 1 week. History of prior surgery. EXAM: MRI CERVICAL SPINE WITHOUT CONTRAST TECHNIQUE: Multiplanar, multisequence MR imaging of the cervical spine was performed. No intravenous contrast was administered. COMPARISON:  Prior radiographs from 02/21/2018 as well as previous MRI from 08/04/2016. FINDINGS: Alignment: Examination moderately degraded by motion artifact. Straightening of the normal cervical lordosis.  No listhesis. Vertebrae: Vertebral body heights maintained without evidence for acute or chronic fracture. Susceptibility artifact from prior fusion at C4 through C6. Underlying bone marrow signal intensity within normal limits. Few small benign hemangiomas noted. No worrisome osseous lesions. Cord: Signal intensity within the cervical spinal cord is normal apparent focus of cord signal abnormality seen on axial GRE sequence image 30 not seen on corresponding sequences, felt to be most consistent with artifact. Posterior Fossa, vertebral arteries, paraspinal tissues: Visualized brain and posterior fossa within normal limits. Craniocervical junction normal. Paraspinous and prevertebral soft tissues are normal. Normal intravascular flow voids present within the vertebral arteries bilaterally. Disc levels: C2-C3: Unremarkable. C3-C4:  Unremarkable. C4-C5:  Prior  ACDF.  No residual or recurrent stenosis. C5-C6: Prior ACDF. Central posterior osseous ridging indents the ventral thecal sac. No significant spinal stenosis. Residual bilateral uncovertebral hypertrophy with resultant moderate bilateral C6 foraminal stenosis, left slightly greater than right. C6-C7: Broad left paracentral disc protrusion indents the left ventral thecal sac, extending laterally towards the left neural foramen. Mild flattening of the left hemi cord without cord signal changes. Mild spinal stenosis. Approximate 6 mm of superior migration. Disc protrusion measures approximately 6 x 22 x 11 mm (AP by transverse by craniocaudad). Moderate to severe left C7 foraminal stenosis. C7-T1:  Mild disc bulge.  No significant stenosis. Visualized upper thoracic spine within normal limits. IMPRESSION: 1. Broad left paracentral disc protrusion at C6-7 with secondary flattening of the left hemi cord, with resultant mild canal and moderate to severe left C7 foraminal stenosis. 2. Prior ACDF at C4 through C6 without significant residual spinal stenosis. Residual uncovertebral disease at C5-6 with resultant moderate bilateral C6 foraminal stenosis. Electronically Signed   By: Rise Mu M.D.   On: 03/04/2018 23:07     HPI   Prior to Admission medications   Medication Sig Start Date End Date Taking? Authorizing Provider  atorvastatin (LIPITOR) 20 MG tablet TAKE 1 TABLET (20 MG TOTAL) BY MOUTH AT BEDTIME. 03/10/18  Yes Weber, Dema Severin, PA-C  colesevelam [facility]) 625 MG tablet Take 3 tablets (1,875 mg total) by mouth daily with breakfast. 02/28/18  Yes Weber, [name] L, PA-C  cyclobenzaprine (AMRIX) 15 MG 24 hr capsule Take 2 capsules (30 mg total) by mouth daily. 02/21/18  Yes Weber, [name] L, PA-C  diazepam (VALIUM) 5 MG tablet Take 1 tablet (5 mg total) by mouth every 8 (eight) hours as needed for anxiety. 03/20/18  Yes Weber, Dema Severin, PA-C  diclofenac (VOLTAREN) 75 MG EC tablet Take 1 tablet (75 mg  total) by mouth 2 (two) times daily. 08/24/17  Yes Weber, [name] L, PA-C  gabapentin (NEURONTIN) 300 MG capsule Take 3 capsules (900 mg total) by mouth 2 (two) times daily. 08/24/17  Yes Weber, [name] L, PA-C  Insulin Pen Needle (BD PEN NEEDLE NANO U/F) 32G X 4 MM MISC 1 Package by Does not apply route 2 (two) times daily. 01/27/18  Yes Beasley, Caren D, MD  liraglutide (VICTOZA) 18 MG/3ML SOPN Inject 0.1 mLs (0.6 mg total) into the skin every morning. 01/27/18  Yes Beasley, Caren D, MD  lisinopril-hydrochlorothiazide (PRINZIDE,ZESTORETIC) 10-12.5 MG tablet Take 1 tablet by mouth at bedtime. 0000 08/24/17  Yes Weber, [name] L, PA-C  metFORMIN (GLUCOPHAGE-XR) 500 MG 24 hr tablet TAKE 2 TABLETS (1,000 MG TOTAL) BY MOUTH 2 (TWO) TIMES DAILY. 08/24/17  Yes Weber, [name] L, PA-C  NON FORMULARY CPAP machine at bedtime & sleep   Yes [provider]  oxyCODONE-acetaminophen (PERCOCET) 10-325 MG tablet Take 1 tablet by mouth every 4 (four) hours as needed for pain. 03/20/18  Yes Weber, Dema Severin, PA-C  Vitamin D, Ergocalciferol, (DRISDOL) 50000 units CAPS capsule Take 1 capsule (50,000 Units total) by mouth every 7 (seven) days. 01/27/18  Yes Quillian Quince D, MD    Allergies  Allergen Reactions  . Penicillin G Anaphylaxis    Gets extremely high fevers.  . Sulfa Antibiotics Other (See Comments)    It will kill him  . Amoxicillin   . Cephalosporins Other (See Comments)    Reaction unspecified.  . Quinolones Other (See Comments)    Reaction unspecified  . Sulfamethoxazole Swelling    Patient Active Problem List   Diagnosis Date Noted  . Chronic neck pain 02/17/2018  . Other fatigue 09/15/2017  . Shortness of breath on exertion 09/15/2017  . Type 2 diabetes mellitus without complication, without long-term current use of insulin (HCC) 09/15/2017  . Other hyperlipidemia 09/15/2017  . Essential hypertension 09/15/2017  . DDD (degenerative disc disease), cervical 05/03/2017  . Cervical herniated disc  10/21/2016  . Fatty liver 09/22/2016  . Spinal stenosis in cervical region 08/05/2016  . Diabetes type 2, controlled (HCC) 10/15/2015  . Hypertriglyceridemia 10/15/2015  . Benign essential HTN 12/05/2014  . Macroglossia 09/05/2014  . GAD (generalized anxiety disorder) 12/11/2013  . Motor tic disorder 12/11/2013  . OSA on CPAP 10/04/2013  . Hypersomnia with sleep apnea, unspecified 10/04/2013  . High frequency hearing loss 08/24/2013  . Peripheral neuropathy 04/19/2013  . Abnormal gait 10/19/2012  . Cervical myelopathy (HCC) 10/19/2012  . Esophageal reflux 07/27/2011  . Morbid obesity due to excess calories (HCC) 07/22/2011  . ADD (attention deficit disorder) 07/22/2011  . ADEM (acute disseminated encephalomyelitis) 07/03/2011  . Splenomegaly 07/03/2011  . Elevated LFTs 07/03/2011    Past Medical History:  Diagnosis Date  . ADEM (acute disseminated encephalomyelitis)   . Allergy   . Anxiety 2005  . Complication of anesthesia   . Depression 2005  . Difficult intubation    difficult intubating and exbutating "throat Clinched around tube"  . Headache(784.0)   . Hearing loss   . Hyperinsulinemia 07/04/11   "I'm on metformin cause I produce too much insulin"  . Hyperlipidemia   . Hypertension   . Mononucleosis, infectious, with hepatitis 06/2011  . MS (multiple sclerosis) (HCC)    patinet has ADEM not MS per Patient  . Nausea    occasional per  history form 08/18/11  . Neuromuscular disorder (HCC)   . Pneumonia 2010  . Sleep apnea    uses CPAP at home  . Stroke (HCC) 2000   TIA  . TIA (transient ischemic attack) 2000   denies residual  . TIA (transient ischemic attack)     Past Surgical History:  Procedure Laterality Date  . ANTERIOR CERVICAL DECOMP/DISCECTOMY FUSION N/A 10/21/2016   Procedure: ANTERIOR CERVICAL DECOMPRESSION/DISCECTOMY FUSION, INTERBODY PROSTHESIS,PLATE CERVICAL FOUR- CERVICAL FIVE, CERVICAL FIVE- CERVICAL SIX;  Surgeon: Tressie Stalker, MD;   Location: [facility] OR;  Service: Neurosurgery;  Laterality: N/A;  . CHOLECYSTECTOMY  08/02/2011   Procedure: LAPAROSCOPIC CHOLECYSTECTOMY;  Surgeon: Shelly Rubenstein, MD;  Location: [facility] OR;  Service: General;;  . COLONOSCOPY  2003  . ESOPHAGOGASTRODUODENOSCOPY  2003  . ESOPHAGOGASTRODUODENOSCOPY  07/28/2011   Procedure: ESOPHAGOGASTRODUODENOSCOPY (EGD);  Surgeon: [name], MD;  Location: [facility] ENDOSCOPY;  Service: Endoscopy;  Laterality: N/A;  . MYRINGOTOMY  1979; 1980   bilaterally  . TONSILLECTOMY AND ADENOIDECTOMY  ~ 1980    Social History   Socioeconomic History  . Marital status: Significant Other    Spouse name: Not on file  . Number of children: 0  . Years of education: college  . Highest education level: Not on file  Occupational History  . Occupation: disabled   Social Needs  . Financial resource strain: Not on file  . Food insecurity:    Worry: Not on file    Inability: Not on file  . Transportation needs:    Medical: Not on file    Non-medical: Not on file  Tobacco Use  . Smoking status: Former Smoker    Packs/day: 3.00    Years: 3.00    Pack years: 9.00    Types: Cigarettes    Last attempt to quit: 08/18/1987    Years since quitting: 30.6  . Smokeless tobacco: Never Used  Substance and Sexual Activity  . Alcohol use: Yes    Alcohol/week: 0.0 standard drinks    Comment: 07/04/11 "glass of wine or spirits once a month"  . Drug use: Yes    Types: Marijuana    Comment: have not used in a 1.5 years  . Sexual activity: Yes    Partners: Female  Lifestyle  . Physical activity:    Days per week: Not on file    Minutes per session: Not on file  . Stress: Not on file  Relationships  . Social connections:    Talks on phone: Not on file    Gets together: Not on file    Attends religious service: Not on file    Active member of club or organization: Not on file    Attends meetings of clubs or organizations: Not on file    Relationship status: Not on file  . Intimate  partner violence:    Fear of current or ex partner: Not on file    Emotionally abused: Not on file    Physically abused: Not on file    Forced sexual activity: Not on file  Other Topics Concern  . Not on file  Social History Narrative   Lives in [place] with 2 domestic male partners "his family unit"   Caffeine: minimal    Family History  Problem Relation Age of Onset  . Coronary artery disease Father   . Stroke Father   . Cancer Father   . High blood pressure Father   . Alcoholism Father   . Diabetes Mother   .  Obesity Mother   . Cancer Maternal Grandmother        bone  . Cancer Other        Stomach -Great-great grandmother     Review of Systems  Constitutional: Negative.  Negative for chills and fever.  Respiratory: Negative for shortness of breath.   Cardiovascular: Negative for chest pain and palpitations.  Gastrointestinal: Positive for constipation. Negative for abdominal pain, diarrhea, nausea and vomiting.  Musculoskeletal: Positive for neck pain.  Skin: Negative.  Negative for rash.  Neurological: Negative.  Negative for dizziness and headaches.  All other systems reviewed and are negative.    Vitals:   03/25/18 1020  BP: 127/83  Pulse: 80  Resp: 18  Temp: 98.5 F (36.9 C)  SpO2: 95%     Physical Exam  Constitutional: He is oriented to person, place, and time. He appears well-developed.  Morbidly obese  HENT:  Head: Normocephalic and atraumatic.  Eyes: Pupils are equal, round, and reactive to light.  Cardiovascular: Normal rate.  Pulmonary/Chest: Effort normal.  Neurological: He is alert and oriented to person, place, and time.  Skin: Skin is warm and dry.  Psychiatric: He has a normal mood and affect. His behavior is normal.  Vitals reviewed.  A total of 25 minutes was spent in the room with the patient, greater than 50% of which was in counseling/coordination of care regarding diagnosis, management, medications and need for follow-up with  neurosurgery and PCP PA Weber as scheduled.  ASSESSMENT & PLAN: Davone was seen today for establish care and medication refill.  Diagnoses and all orders for this visit:  Cervical disc disorder with radiculopathy of cervical region -     diazepam (VALIUM) 5 MG tablet; Take 1 tablet (5 mg total) by mouth every 8 (eight) hours as needed for anxiety.  Hyperinsulinemia -     metFORMIN (GLUCOPHAGE-XR) 500 MG 24 hr tablet; TAKE 2 TABLETS (1,000 MG TOTAL) BY MOUTH 2 (TWO) TIMES DAILY.  Muscle spasm -     diazepam (VALIUM) 5 MG tablet; Take 1 tablet (5 mg total) by mouth every 8 (eight) hours as needed for anxiety.  Chronic neck pain    Patient Instructions       If you have lab work done today you will be contacted with your lab results within the next 2 weeks.  If you have not heard from [place] then please contact us. The fastest way to get your results is to register for My Chart.   IF you received an x-ray today, you will receive an invoice from [facility]. Please contact [facility] at [phone] with questions or concerns regarding your invoice.   IF you received labwork today, you will receive an invoice from [place]. Please contact [facility] at [phone] with questions or concerns regarding your invoice.   Our billing staff will not be able to assist you with questions regarding bills from these companies.  You will be contacted with the lab results as soon as they are available. The fastest way to get your results is to activate your My Chart account. Instructions are located on the last page of this paperwork. If you have not heard from [place] regarding the results in 2 weeks, please contact this office.      Cervical Radiculopathy Cervical radiculopathy means that a nerve in the neck is pinched or bruised. This can cause pain or loss of feeling (numbness) that runs from your neck to your arm and fingers. Follow these instructions at home:  Managing  pain  Take over-the-counter and prescription medicines only as told by your doctor.  If directed, put ice on the injured or painful area. ? Put ice in a plastic bag. ? Place a towel between your skin and the bag. ? Leave the ice on for 20 minutes, 2-3 times per day.  If ice does not help, you can try using heat. Take a warm shower or warm bath, or use a heat pack as told by your doctor.  You may try a gentle neck and shoulder massage. Activity  Rest as needed. Follow instructions from your doctor about any activities to avoid.  Do exercises as told by your doctor or physical therapist. General instructions  If you were given a soft collar, wear it as told by your doctor.  Use a flat pillow when you sleep.  Keep all follow-up visits as told by your doctor. This is important. Contact a doctor if:  Your condition does not improve with treatment. Get help right away if:  Your pain gets worse and is not controlled with medicine.  You lose feeling or feel weak in your hand, arm, face, or leg.  You have a fever.  You have a stiff neck.  You cannot control when you poop or pee (have incontinence).  You have trouble with walking, balance, or talking. This information is not intended to replace advice given to you by your health care provider. Make sure you discuss any questions you have with your health care provider. Document Released: 06/11/2011 Document Revised: 11/28/2015 Document Reviewed: 08/16/2014 Elsevier Interactive Patient Education  2018 [employer].      Edwina Barth, MD [facility]

## 2018-03-30 ENCOUNTER — Ambulatory Visit (INDEPENDENT_AMBULATORY_CARE_PROVIDER_SITE_OTHER): Payer: 59 | Admitting: Family Medicine

## 2018-03-30 ENCOUNTER — Other Ambulatory Visit (INDEPENDENT_AMBULATORY_CARE_PROVIDER_SITE_OTHER): Payer: Self-pay | Admitting: Family Medicine

## 2018-03-30 ENCOUNTER — Other Ambulatory Visit (HOSPITAL_COMMUNITY): Payer: Medicare Other

## 2018-03-30 VITALS — BP 126/82 | HR 86 | Temp 98.0°F | Ht 68.0 in | Wt 301.0 lb

## 2018-03-30 DIAGNOSIS — E119 Type 2 diabetes mellitus without complications: Secondary | ICD-10-CM

## 2018-03-30 DIAGNOSIS — E559 Vitamin D deficiency, unspecified: Secondary | ICD-10-CM

## 2018-03-30 DIAGNOSIS — Z6841 Body Mass Index (BMI) 40.0 and over, adult: Secondary | ICD-10-CM | POA: Diagnosis not present

## 2018-03-30 MED ORDER — VITAMIN D (ERGOCALCIFEROL) 1.25 MG (50000 UNIT) PO CAPS: 50000.0000 [IU] | ORAL_CAPSULE | ORAL | 0 refills | Status: DC

## 2018-03-31 ENCOUNTER — Encounter: Payer: Self-pay | Admitting: Emergency Medicine

## 2018-03-31 NOTE — Progress Notes (Signed)
Office: [phone]  /  Fax: [phone]   HPI:   Chief Complaint: OBESITY Brandon [name] is here to discuss his progress with his obesity treatment plan. Brandon [name] is on the Category 4 plan and is following his eating plan approximately 15 % of the time. Brandon [name] states Brandon [name] is exercising 0 minutes 0 times per week. Brandon [name] continues to lose weight even though Brandon [name] is deviating from his plan more. Brandon [name] is having worsening back problems and Brandon [name] is stress eating.  His weight is (!) 301 lb (136.5 kg) today and has had a weight loss of 5 pounds over a period of 3 weeks since his last visit. Brandon [name] has lost 4 lbs since starting treatment with [place].  Vitamin D Deficiency Brandon [name] has a diagnosis of vitamin D deficiency. Brandon [name] is stable on prescription Vit D, but level is not yet at goal. Brandon [name] denies nausea, vomiting or muscle weakness.  ALLERGIES: Allergies  Allergen Reactions  . Penicillin G Anaphylaxis    Gets extremely high fevers.  . Sulfa Antibiotics Other (See Comments)    It will kill him  . Amoxicillin   . Cephalosporins Other (See Comments)    Reaction unspecified.  . Quinolones Other (See Comments)    Reaction unspecified  . Sulfamethoxazole Swelling    MEDICATIONS: Current Outpatient Medications on File Prior to Visit  Medication Sig Dispense Refill  . atorvastatin (LIPITOR) 20 MG tablet TAKE 1 TABLET (20 MG TOTAL) BY MOUTH AT BEDTIME. 30 tablet 0  . colesevelam (WELCHOL) 625 MG tablet Take 3 tablets (1,875 mg total) by mouth daily with breakfast. 540 tablet 1  . cyclobenzaprine (AMRIX) 15 MG 24 hr capsule Take 2 capsules (30 mg total) by mouth daily. 60 capsule 5  . diazepam (VALIUM) 5 MG tablet Take 1 tablet (5 mg total) by mouth every 8 (eight) hours as needed for anxiety. 45 tablet 0  . diclofenac (VOLTAREN) 75 MG EC tablet Take 1 tablet (75 mg total) by mouth 2 (two) times daily. 180 tablet 1  . gabapentin (NEURONTIN) 300 MG capsule Take 3 capsules (900 mg total) by mouth 2 (two) times daily. 540  capsule 1  . Insulin Pen Needle (BD PEN NEEDLE NANO U/F) 32G X 4 MM MISC 1 Package by Does not apply route 2 (two) times daily. 100 each 0  . liraglutide (VICTOZA) 18 MG/3ML SOPN Inject 0.1 mLs (0.6 mg total) into the skin every morning. 1 pen 0  . lisinopril-hydrochlorothiazide (PRINZIDE,ZESTORETIC) 10-12.5 MG tablet Take 1 tablet by mouth at bedtime. 0000 90 tablet 1  . metFORMIN (GLUCOPHAGE-XR) 500 MG 24 hr tablet TAKE 2 TABLETS (1,000 MG TOTAL) BY MOUTH 2 (TWO) TIMES DAILY. 360 tablet 1  . NON FORMULARY CPAP machine at bedtime & sleep    . oxyCODONE-acetaminophen (PERCOCET) 10-325 MG tablet Take 1 tablet by mouth every 4 (four) hours as needed for pain. 90 tablet 0   No current facility-administered medications on file prior to visit.     PAST MEDICAL HISTORY: Past Medical History:  Diagnosis Date  . ADEM (acute disseminated encephalomyelitis)   . Allergy   . Anxiety 2005  . Complication of anesthesia   . Depression 2005  . Difficult intubation    difficult intubating and exbutating "throat Clinched around tube"  . Headache(784.0)   . Hearing loss   . Hyperinsulinemia 07/04/11   "I'm on metformin cause I produce too much insulin"  . Hyperlipidemia   . Hypertension   . Mononucleosis, infectious, with hepatitis 06/2011  .  MS (multiple sclerosis) (HCC)    patinet has ADEM not MS per Patient  . Nausea    occasional per history form 08/18/11  . Neuromuscular disorder (HCC)   . Pneumonia 2010  . Sleep apnea    uses CPAP at home  . Stroke (HCC) 2000   TIA  . TIA (transient ischemic attack) 2000   denies residual  . TIA (transient ischemic attack)     PAST SURGICAL HISTORY: Past Surgical History:  Procedure Laterality Date  . ANTERIOR CERVICAL DECOMP/DISCECTOMY FUSION N/A 10/21/2016   Procedure: ANTERIOR CERVICAL DECOMPRESSION/DISCECTOMY FUSION, INTERBODY PROSTHESIS,PLATE CERVICAL FOUR- CERVICAL FIVE, CERVICAL FIVE- CERVICAL SIX;  Surgeon: Tressie Stalker, MD;  Location: [facility]  OR;  Service: Neurosurgery;  Laterality: N/A;  . CHOLECYSTECTOMY  08/02/2011   Procedure: LAPAROSCOPIC CHOLECYSTECTOMY;  Surgeon: Shelly Rubenstein, MD;  Location: [facility] OR;  Service: General;;  . COLONOSCOPY  2003  . ESOPHAGOGASTRODUODENOSCOPY  2003  . ESOPHAGOGASTRODUODENOSCOPY  07/28/2011   Procedure: ESOPHAGOGASTRODUODENOSCOPY (EGD);  Surgeon: [name], MD;  Location: [facility] ENDOSCOPY;  Service: Endoscopy;  Laterality: N/A;  . MYRINGOTOMY  1979; 1980   bilaterally  . TONSILLECTOMY AND ADENOIDECTOMY  ~ 1980    SOCIAL HISTORY: Social History   Tobacco Use  . Smoking status: Former Smoker    Packs/day: 3.00    Years: 3.00    Pack years: 9.00    Types: Cigarettes    Last attempt to quit: 08/18/1987    Years since quitting: 30.6  . Smokeless tobacco: Never Used  Substance Use Topics  . Alcohol use: Yes    Alcohol/week: 0.0 standard drinks    Comment: 07/04/11 "glass of wine or spirits once a month"  . Drug use: Yes    Types: Marijuana    Comment: have not used in a 1.5 years    FAMILY HISTORY: Family History  Problem Relation Age of Onset  . Coronary artery disease Father   . Stroke Father   . Cancer Father   . High blood pressure Father   . Alcoholism Father   . Diabetes Mother   . Obesity Mother   . Cancer Maternal Grandmother        bone  . Cancer Other        Stomach -Great-great grandmother    ROS: Review of Systems  Constitutional: Positive for weight loss.  Gastrointestinal: Negative for nausea and vomiting.  Musculoskeletal: Positive for back pain.       Negative muscle weakness    PHYSICAL EXAM: Blood pressure 126/82, pulse 86, temperature 98 F (36.7 C), temperature source Oral, height 5\' 8"  (1.727 m), weight (!) 301 lb (136.5 kg), SpO2 96 %. Body mass index is 45.77 kg/m. Physical Exam  Constitutional: Brandon [name] is oriented to person, place, and time. Brandon [name] appears well-developed and well-nourished.  Cardiovascular: Normal rate.  Pulmonary/Chest: Effort  normal.  Musculoskeletal: Normal range of motion.  Neurological: Brandon [name] is oriented to person, place, and time.  Skin: Skin is warm and dry.  Psychiatric: Brandon [name] has a normal mood and affect. His behavior is normal.  Vitals reviewed.   RECENT LABS AND TESTS: BMET    Component Value Date/Time   NA 143 02/16/2018 1037   K 3.9 02/16/2018 1037   CL 104 02/16/2018 1037   CO2 24 02/16/2018 1037   GLUCOSE 84 02/16/2018 1037   GLUCOSE 88 08/24/2017 1458   BUN 14 02/16/2018 1037   CREATININE 1.10 02/16/2018 1037   CREATININE 1.02 08/24/2017 1458   CALCIUM 9.4 02/16/2018 1037  GFRNONAA 80 02/16/2018 1037   GFRNONAA >89 07/18/2015 1656   GFRAA 93 02/16/2018 1037   GFRAA >89 07/18/2015 1656   Lab Results  Component Value Date   HGBA1C 5.5 02/16/2018   HGBA1C 5.7 (H) 09/15/2017   HGBA1C 5.6 08/24/2017   HGBA1C 6.5 (H) 02/18/2017   HGBA1C 5.5 09/17/2016   Lab Results  Component Value Date   INSULIN 60.7 (H) 02/16/2018   INSULIN 110.6 (H) 09/15/2017   CBC    Component Value Date/Time   WBC 6.3 09/15/2017 0902   WBC 6.2 10/12/2016 1441   RBC 5.74 09/15/2017 0902   RBC 5.27 10/12/2016 1441   HGB 16.6 09/15/2017 0902   HCT 49.5 09/15/2017 0902   PLT 151 10/12/2016 1441   PLT 158 09/17/2016 1611   MCV 86 09/15/2017 0902   MCH 28.9 09/15/2017 0902   MCH 28.1 10/12/2016 1441   MCHC 33.5 09/15/2017 0902   MCHC 34.0 10/12/2016 1441   RDW 14.7 09/15/2017 0902   LYMPHSABS 2.2 09/15/2017 0902   MONOABS 0.5 12/05/2014 1538   EOSABS 0.1 09/15/2017 0902   BASOSABS 0.0 09/15/2017 0902   Iron/TIBC/Ferritin/ %Sat No results found for: IRON, TIBC, FERRITIN, IRONPCTSAT Lipid Panel     Component Value Date/Time   CHOL 116 02/16/2018 1037   TRIG 340 (H) 02/16/2018 1037   HDL 27 (L) 02/16/2018 1037   CHOLHDL 3.4 08/24/2017 1458   VLDL 70 (H) 10/15/2015 1112   LDLCALC 21 02/16/2018 1037   LDLCALC 57 08/24/2017 1458   Hepatic Function Panel     Component Value Date/Time   PROT 6.7  02/16/2018 1037   ALBUMIN 4.4 02/16/2018 1037   AST 26 02/16/2018 1037   ALT 59 (H) 02/16/2018 1037   ALKPHOS 62 02/16/2018 1037   BILITOT 0.4 02/16/2018 1037   BILIDIR 0.1 04/11/2015 1638   IBILI 0.6 04/11/2015 1638      Component Value Date/Time   TSH 2.080 09/15/2017 0902   TSH 3.190 09/17/2016 1611   TSH 2.339 12/05/2014 1538  Results for REVIS, BOTZ "[name]" (MRN [id number]) as of 03/31/2018 07:20  Ref. Range 02/16/2018 10:37  Vitamin D, 25-Hydroxy Latest Ref Range: 30.0 - 100.0 ng/mL 28.6 (L)    ASSESSMENT AND PLAN: Vitamin D deficiency - Plan: Vitamin D, Ergocalciferol, (DRISDOL) 50000 units CAPS capsule  Class 3 severe obesity with serious comorbidity and body mass index (BMI) of 45.0 to 49.9 in adult, unspecified obesity type (HCC)  PLAN:  Vitamin D Deficiency Brandon [name] was informed that low vitamin D levels contributes to fatigue and are associated with obesity, breast, and colon cancer. Brandon [name] agrees to continue taking prescription Vit D @50 ,000 IU every week #4 and we will refill for 1 month. Brandon [name] will follow up for routine testing of vitamin D, at least 2-3 times per year. Brandon [name] was informed of the risk of over-replacement of vitamin D and agrees to not increase his dose unless Brandon [name] discusses this with [place] first. Brandon [name] agrees to follow up with our clinic in 3 weeks.  Obesity Brandon [name] is currently in the action stage of change. As such, his goal is to continue with weight loss efforts Brandon [name] has agreed to follow the Category 4 plan Brandon [name] has been instructed to work up to a goal of 150 minutes of combined cardio and strengthening exercise per week for weight loss and overall health benefits. We discussed the following Behavioral Modification Strategies today: increasing lean protein intake and decreasing simple carbohydrates    Brandon [name] has agreed  to follow up with our clinic in 3 weeks. Brandon [name] was informed of the importance of frequent follow up visits to maximize his success  with intensive lifestyle modifications for his multiple health conditions.   OBESITY BEHAVIORAL INTERVENTION VISIT  Today's visit was # 10   Starting weight: 305 lbs Starting date: 09/15/17 Today's weight : 301 lbs Today's date: 03/30/2018 Total lbs lost to date: 4    ASK: We discussed the diagnosis of obesity with Brandon [name] today and Brandon Needle agreed to give [place] permission to discuss obesity behavioral modification therapy today.  ASSESS: Brandon [name] has the diagnosis of obesity and his BMI today is 45.78 Brandon [name] is in the action stage of change   ADVISE: Brandon [name] was educated on the multiple health risks of obesity as well as the benefit of weight loss to improve his health. Brandon [name] was advised of the need for long term treatment and the importance of lifestyle modifications to improve his current health and to decrease his risk of future health problems.  AGREE: Multiple dietary modification options and treatment options were discussed and  Brandon [name] agreed to follow the recommendations documented in the above note.  ARRANGE: Brandon [name] was educated on the importance of frequent visits to treat obesity as outlined per CMS and USPSTF guidelines and agreed to schedule his next follow up appointment today.  I, Burt Knack, am acting as transcriptionist for Quillian Quince, MD  I have reviewed the above documentation for accuracy and completeness, and I agree with the above. -Quillian Quince, MD

## 2018-04-01 ENCOUNTER — Other Ambulatory Visit: Payer: Self-pay | Admitting: Emergency Medicine

## 2018-04-01 NOTE — Telephone Encounter (Signed)
I can help until he follows up with [name].

## 2018-04-04 ENCOUNTER — Encounter (HOSPITAL_COMMUNITY): Payer: Self-pay

## 2018-04-04 ENCOUNTER — Ambulatory Visit (HOSPITAL_COMMUNITY): Admit: 2018-04-04 | Payer: Medicare Other | Admitting: Neurosurgery

## 2018-04-04 SURGERY — ANTERIOR CERVICAL DECOMPRESSION/DISCECTOMY FUSION 1 LEVEL
Anesthesia: General

## 2018-04-05 ENCOUNTER — Encounter: Payer: Self-pay | Admitting: Emergency Medicine

## 2018-04-05 MED ORDER — OXYCODONE-ACETAMINOPHEN 10-325 MG PO TABS: 1.0000 | ORAL_TABLET | Freq: Three times a day (TID) | ORAL | 0 refills | Status: DC | PRN

## 2018-04-05 MED ORDER — DIAZEPAM 5 MG PO TABS: 5.0000 mg | ORAL_TABLET | Freq: Three times a day (TID) | ORAL | 0 refills | Status: DC | PRN

## 2018-04-05 NOTE — Addendum Note (Signed)
Addended by: Evie Lacks on: 04/05/2018 12:29 PM   Modules accepted: Orders

## 2018-04-05 NOTE — Telephone Encounter (Signed)
Prescriptions sent. Should be good for at least 10 days.Must follow up with Benny Lennert afterwards.

## 2018-04-12 ENCOUNTER — Encounter (INDEPENDENT_AMBULATORY_CARE_PROVIDER_SITE_OTHER): Payer: Self-pay | Admitting: Family Medicine

## 2018-04-12 IMAGING — MR MR CERVICAL SPINE W/O CM
4 of 5 series · 27 of 48 positions shown · non-contrast
Comparison: None.

CLINICAL DATA: Severe neck pain, LEFT shoulder and LEFT arm pain
for 2 months. No injury.

EXAM:
MRI CERVICAL SPINE WITHOUT CONTRAST
TECHNIQUE: Multiplanar, multisequence MR imaging of the cervical spine was
performed. No intravenous contrast was administered.

[Series 3: tir sag · sagittal · 3.0mm · 0.41mm/px · 6 of 13 slices shown]
[im 1/13]
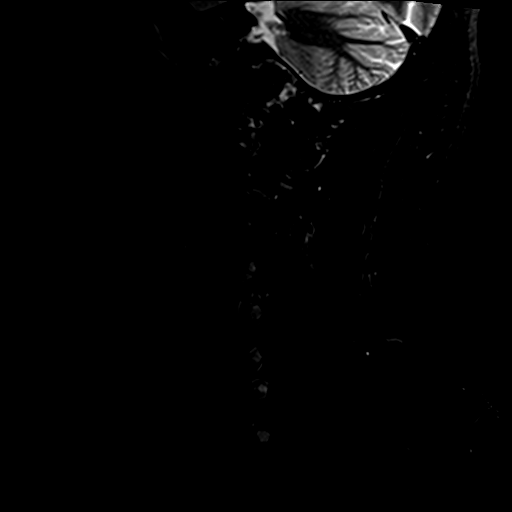
[im 3/13]
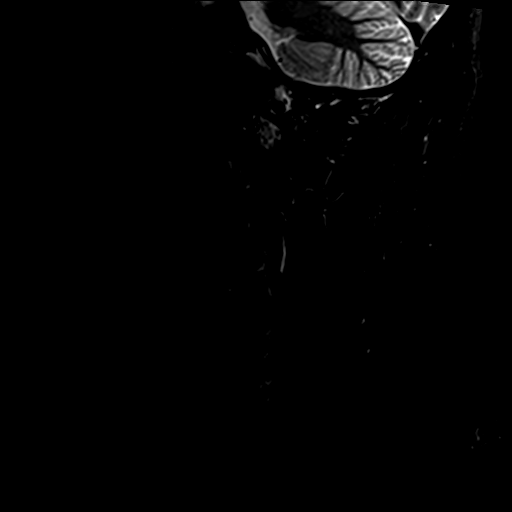
[im 5/13]
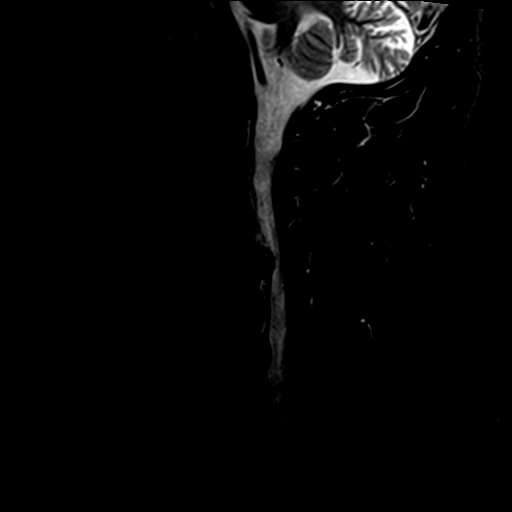
[im 7/13]
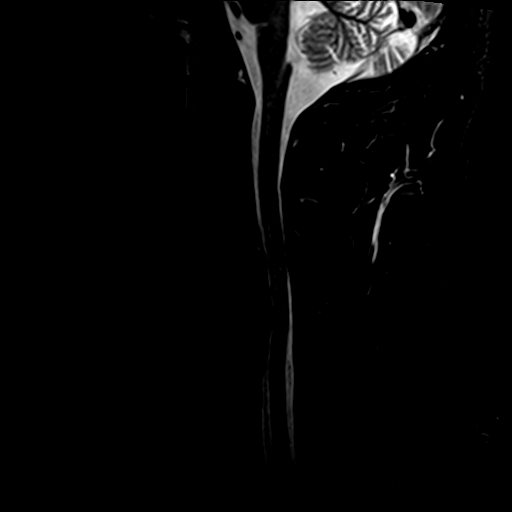
[im 9/13]
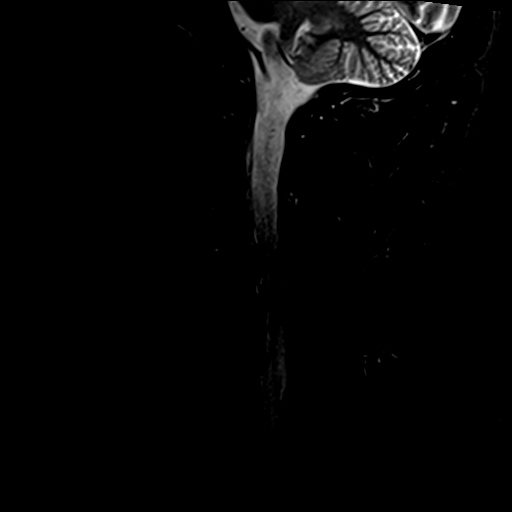
[im 11/13]
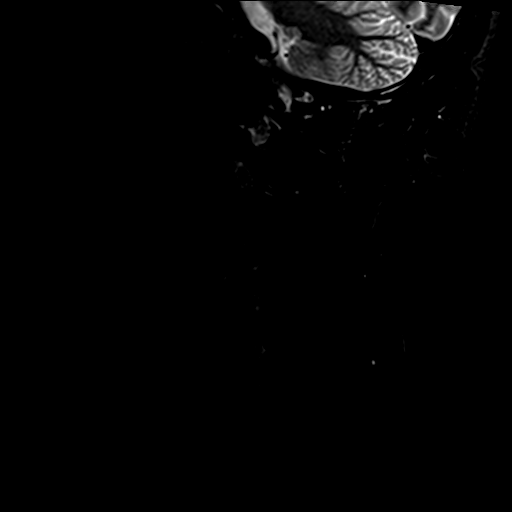

[Series 4: T1 · sagittal · 3.0mm · 0.41mm/px · 7 of 13 slices shown]
[im 1/13]
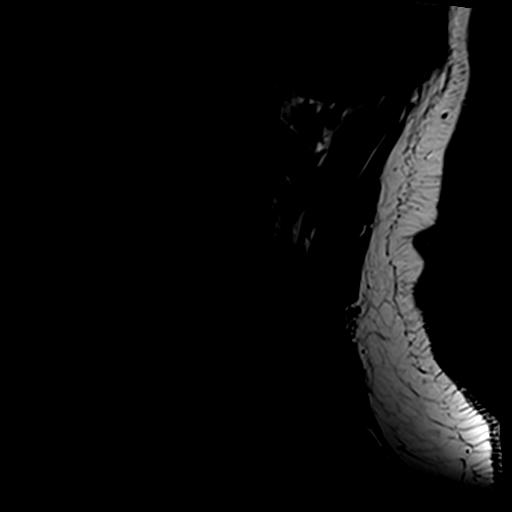
[im 3/13]
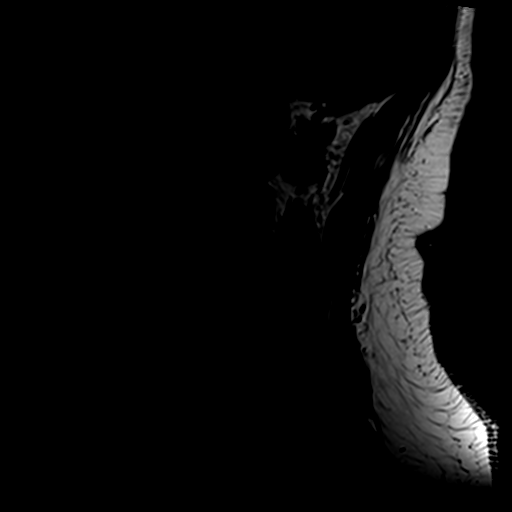
[im 5/13]
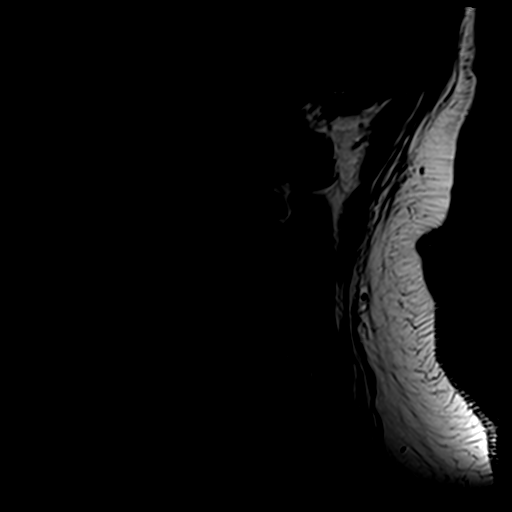
[im 7/13]
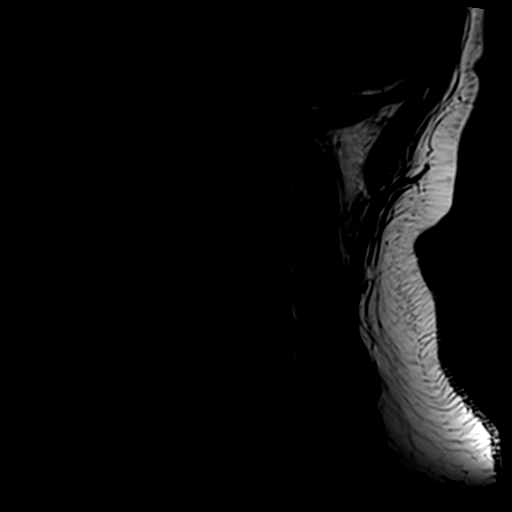
[im 9/13]
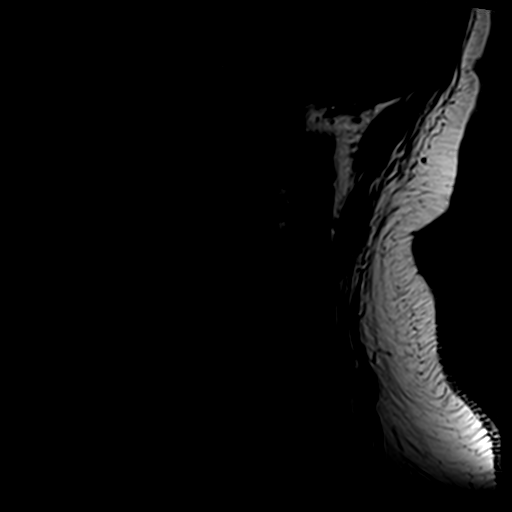
[im 11/13]
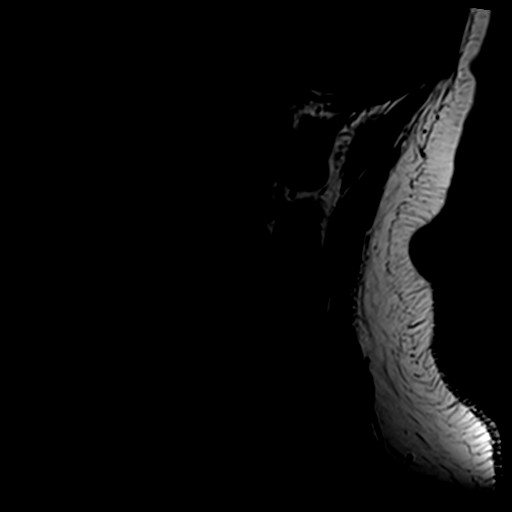
[im 13/13]
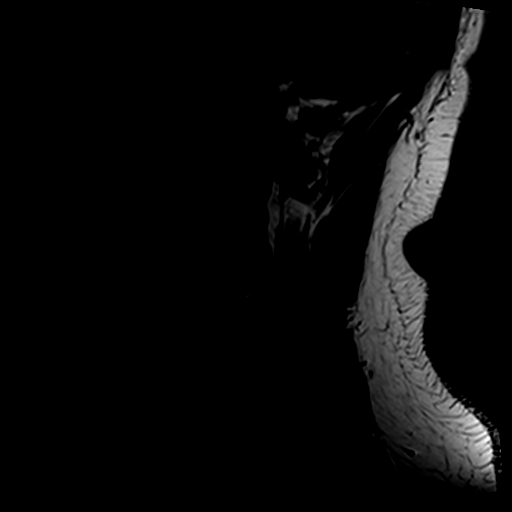

[Series 5: T2 · sagittal · 3.0mm · 0.66mm/px · 6 of 13 slices shown (1 of 2)]
[im 1/13]
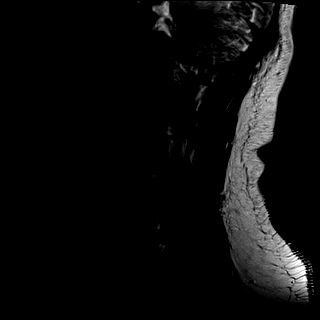
[im 3/13]
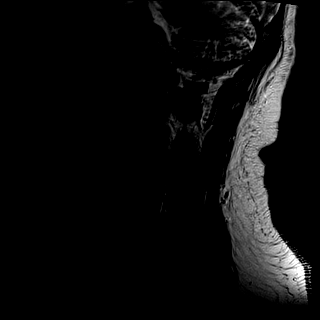
[im 5/13]
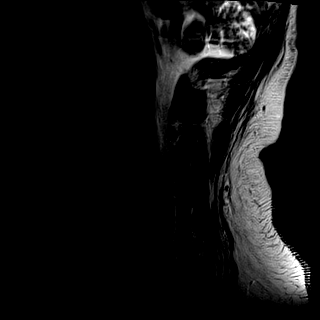
[im 8/13]
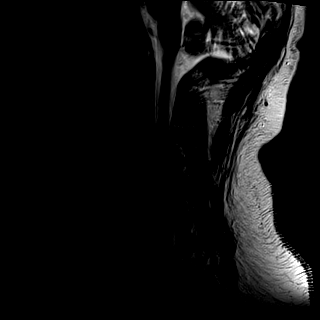
[im 10/13]
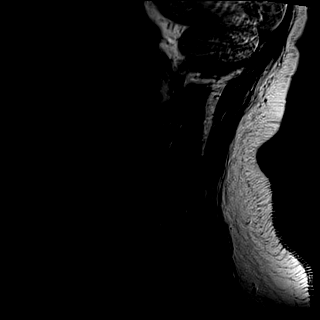
[im 13/13]
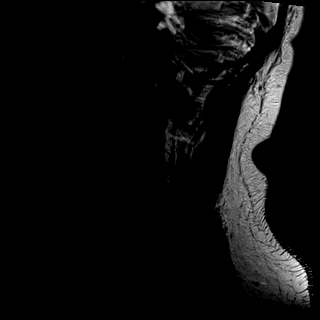

[Series 7: T2 · axial · 3.0mm · 0.78mm/px · z∈[-66,+38]mm · 8 of 29 slices shown (2 of 2)]
[im 1/29]
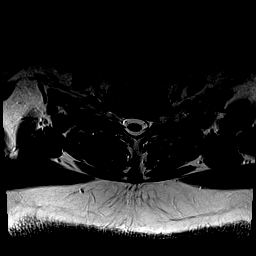
[im 5/29]
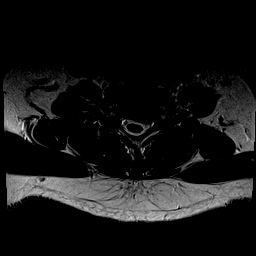
[im 9/29]
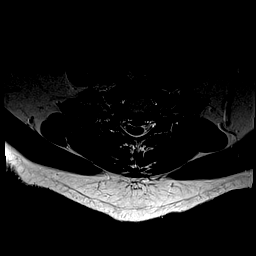
[im 13/29]
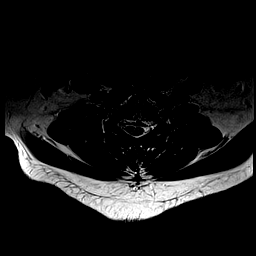
[im 16/29]
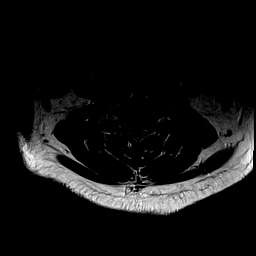
[im 20/29]
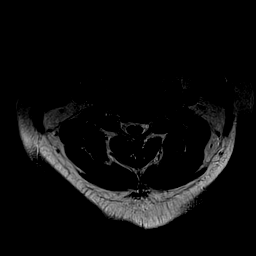
[im 24/29]
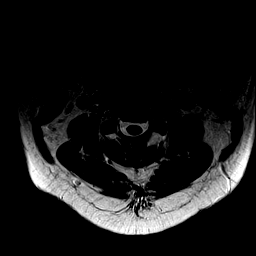
[im 29/29]
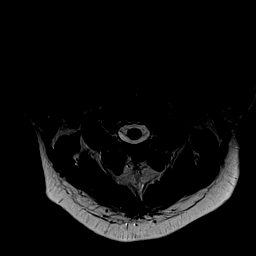

[27 of 48 positions shown; findings below may reference images not displayed]

FINDINGS: Moderately motion degraded sagittal T2, additional sequences are
mildly motion degraded.

ALIGNMENT: Straightened cervical lordosis.  No malalignment.

VERTEBRAE/DISCS: Vertebral bodies are intact. Mild C4-5 and moderate
C5-6 disc height loss. Moderate subacute on chronic discogenic
endplate change at C5-6. Mild chronic discogenic endplate changes
C4-5, C6-7 and C7-T1. No abnormal bone marrow signal. Borderline
congenital canal narrowing on the basis of foreshortened pedicles.

CORD:Cervical spinal cord is normal morphology and signal
characteristics from the cervicomedullary junction to level of T1-2,
the most caudal well visualized level.

POSTERIOR FOSSA, VERTEBRAL ARTERIES, PARASPINAL TISSUES: No MR
findings of ligamentous injury. Vertebral artery flow voids present.
Included posterior fossa and paraspinal soft tissues are normal.

DISC LEVELS:

C2-3 and C3-4: No disc bulge, canal stenosis nor neural foraminal
narrowing. Mild facet arthropathy.

C4-5: 6 x 4 mm (transverse by AP) LEFT central to subarticular disc
extrusion with 13 mm of contiguous superior and inferior migration.
Bright STIR signal within the extrusion suggests more acute process.
Moderate canal stenosis. Moderate RIGHT and severe LEFT neural
foraminal narrowing.

C5-6: Small broad-based disc bulge and small central disc extrusion,
difficult to characterize due to patient motion. Uncovertebral
hypertrophy. Mild canal stenosis. Mild RIGHT, moderate to severe
LEFT neural foraminal narrowing.

C6-7: Moderate LEFT subarticular disc protrusion. No canal stenosis.
Mild to moderate LEFT neural foraminal narrowing.

C7-T1: No disc bulge, canal stenosis nor neural foraminal narrowing.
IMPRESSION: Motion degraded examination.

6 x 4 x 13 mm LEFT central to subarticular C4-5 disc extrusion
appears acute. Additional small central C5-6 disc extrusion.

Moderate canal stenosis C4-5, mild at C5-6.

Neural foraminal narrowing C4-5 through C6-7: Severe on the LEFT at
C4-5, moderate to severe on the LEFT at C5-6.

## 2018-04-13 ENCOUNTER — Encounter (INDEPENDENT_AMBULATORY_CARE_PROVIDER_SITE_OTHER): Payer: Self-pay | Admitting: Family Medicine

## 2018-04-13 NOTE — Telephone Encounter (Signed)
Can you take care of this for me.

## 2018-04-13 NOTE — Telephone Encounter (Signed)
Can you look at this

## 2018-04-14 DIAGNOSIS — M5030 Other cervical disc degeneration, unspecified cervical region: Secondary | ICD-10-CM | POA: Diagnosis not present

## 2018-04-14 DIAGNOSIS — M5020 Other cervical disc displacement, unspecified cervical region: Secondary | ICD-10-CM | POA: Diagnosis not present

## 2018-04-14 DIAGNOSIS — M542 Cervicalgia: Secondary | ICD-10-CM | POA: Diagnosis not present

## 2018-04-14 DIAGNOSIS — G0400 Acute disseminated encephalitis and encephalomyelitis, unspecified: Secondary | ICD-10-CM | POA: Diagnosis not present

## 2018-04-14 DIAGNOSIS — I10 Essential (primary) hypertension: Secondary | ICD-10-CM | POA: Diagnosis not present

## 2018-04-14 DIAGNOSIS — Z79899 Other long term (current) drug therapy: Secondary | ICD-10-CM | POA: Diagnosis not present

## 2018-04-14 DIAGNOSIS — G959 Disease of spinal cord, unspecified: Secondary | ICD-10-CM | POA: Diagnosis not present

## 2018-04-14 DIAGNOSIS — G8929 Other chronic pain: Secondary | ICD-10-CM | POA: Diagnosis not present

## 2018-04-20 ENCOUNTER — Ambulatory Visit (INDEPENDENT_AMBULATORY_CARE_PROVIDER_SITE_OTHER): Payer: 59 | Admitting: Family Medicine

## 2018-04-20 VITALS — BP 134/85 | HR 71 | Temp 97.9°F | Ht 68.0 in | Wt 300.0 lb

## 2018-04-20 DIAGNOSIS — E559 Vitamin D deficiency, unspecified: Secondary | ICD-10-CM

## 2018-04-20 DIAGNOSIS — Z9189 Other specified personal risk factors, not elsewhere classified: Secondary | ICD-10-CM | POA: Diagnosis not present

## 2018-04-20 DIAGNOSIS — F3289 Other specified depressive episodes: Secondary | ICD-10-CM | POA: Diagnosis not present

## 2018-04-20 DIAGNOSIS — Z6841 Body Mass Index (BMI) 40.0 and over, adult: Secondary | ICD-10-CM | POA: Diagnosis not present

## 2018-04-20 DIAGNOSIS — E119 Type 2 diabetes mellitus without complications: Secondary | ICD-10-CM

## 2018-04-20 MED ORDER — VITAMIN D (ERGOCALCIFEROL) 1.25 MG (50000 UNIT) PO CAPS: 50000.0000 [IU] | ORAL_CAPSULE | ORAL | 0 refills | Status: DC

## 2018-04-25 NOTE — Progress Notes (Signed)
Office: [phone]  /  Fax: [phone]   HPI:   Chief Complaint: OBESITY Brandon [name] is here to discuss his progress with his obesity treatment plan. He is on the Category 4 plan and is following his eating plan approximately 10 % of the time. He states he is exercising 0 minutes 0 times per week. [name] has a lot of junk food [name] the home currently and is off track. 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  His weight is 300 lb (136.1 kg) today and has had a weight loss of 1 pound over a period of 3 weeks since his last visit. He has lost 5 lbs since starting treatment with [place].  Vitamin D Deficiency Brandon [name] has a diagnosis of vitamin D deficiency. He is currently taking prescription Vit D and denies nausea, vomiting or muscle weakness.  At risk for osteopenia and osteoporosis Brandon [name] is at higher risk of osteopenia and osteoporosis due to vitamin D deficiency.   Diabetes II Brandon [name] has a diagnosis of diabetes type II. Brandon [name]'s levels are well controlled, and last A1c was 5.5 on 02/16/18. He notes polyphagia and denies any hypoglycemic episodes. He has been working on intensive lifestyle modifications including diet, exercise, and weight loss to help control his blood glucose levels.  Depression with emotional eating behaviors Brandon [name] is currently very stressed and overeating. He has a therapist but has not seen his therapist [name] several months. Brandon [name] struggles with emotional eating and using food for comfort to the extent that it is negatively impacting his health. He often snacks when he is not hungry. Brandon [name] sometimes feels he is out of control and then feels guilty that he made poor food choices. He has been working on behavior modification techniques to help reduce his emotional eating and has been somewhat successful. He shows no sign of suicidal or homicidal ideations.  Depression screen Brandon [name] 2/9 03/25/2018 02/17/2018 09/15/2017  08/24/2017 07/08/2017  Decreased Interest 0 0 1 0 0  Down, Depressed, Hopeless 0 0 1 0 0  PHQ - 2 Score 0 0 2 0 0  Altered sleeping - - 2 - -  Tired, decreased energy - - 1 - -  Change [name] appetite - - 3 - -  Feeling bad or failure about yourself  - - 1 - -  Trouble concentrating - - 3 - -  Moving slowly or fidgety/restless - - 1 - -  Suicidal thoughts - - 0 - -  PHQ-9 Score - - 13 - -    ALLERGIES: Allergies  Allergen Reactions  . Penicillin G Anaphylaxis    Gets extremely high fevers.  . Sulfa Antibiotics Other (See Comments)    It will kill him  . Amoxicillin   . Cephalosporins Other (See Comments)    Reaction unspecified.  . Quinolones Other (See Comments)    Reaction unspecified  . Sulfamethoxazole Swelling    MEDICATIONS: Current Outpatient Medications on File Prior to Visit  Medication Sig Dispense Refill  . atorvastatin (LIPITOR) 20 MG tablet TAKE 1 TABLET (20 MG TOTAL) BY MOUTH AT BEDTIME. 30 tablet 0  . colesevelam (WELCHOL) 625 MG tablet Take 3 tablets (1,875 mg total) by mouth daily with breakfast. 540 tablet 1  . cyclobenzaprine (AMRIX) 15 MG 24 hr capsule Take 2 capsules (30 mg total) by mouth daily. 60 capsule 5  . diazepam (VALIUM) 5 MG tablet Take 1 tablet (5 mg total) by mouth every 8 (  eight) hours as needed for anxiety. 20 tablet 0  . diclofenac (VOLTAREN) 75 MG EC tablet Take 1 tablet (75 mg total) by mouth 2 (two) times daily. 180 tablet 1  . gabapentin (NEURONTIN) 300 MG capsule Take 3 capsules (900 mg total) by mouth 2 (two) times daily. 540 capsule 1  . Insulin Pen Needle (BD PEN NEEDLE NANO U/F) 32G X 4 MM MISC 1 Package by Does not apply route 2 (two) times daily. 100 each 0  . liraglutide (VICTOZA) 18 MG/3ML SOPN Inject 0.1 mLs (0.6 mg total) into the skin every morning. 1 pen 0  . lisinopril-hydrochlorothiazide (PRINZIDE,ZESTORETIC) 10-12.5 MG tablet Take 1 tablet by mouth at bedtime. 0000 90 tablet 1  . metFORMIN (GLUCOPHAGE-XR) 500 MG 24 hr tablet  TAKE 2 TABLETS (1,000 MG TOTAL) BY MOUTH 2 (TWO) TIMES DAILY. 360 tablet 1  . NON FORMULARY CPAP machine at bedtime & sleep    . oxyCODONE-acetaminophen (PERCOCET) 10-325 MG tablet Take 1 tablet by mouth every 8 (eight) hours as needed for pain. 30 tablet 0   No current facility-administered medications on file prior to visit.     PAST MEDICAL HISTORY: Past Medical History:  Diagnosis Date  . ADEM (acute disseminated encephalomyelitis)   . Allergy   . Anxiety 2005  . Complication of anesthesia   . Depression 2005  . Difficult intubation    difficult intubating and exbutating "throat Clinched around tube"  . Headache(784.0)   . Hearing loss   . Hyperinsulinemia 07/04/11   "I'm on metformin cause I produce too much insulin"  . Hyperlipidemia   . Hypertension   . Mononucleosis, infectious, with hepatitis 06/2011  . MS (multiple sclerosis) (HCC)    patinet has ADEM not MS per Patient  . Nausea    occasional per history form 08/18/11  . Neuromuscular disorder (HCC)   . Pneumonia 2010  . Sleep apnea    uses CPAP at home  . Stroke (HCC) 2000   TIA  . TIA (transient ischemic attack) 2000   denies residual  . TIA (transient ischemic attack)     PAST SURGICAL HISTORY: Past Surgical History:  Procedure Laterality Date  . ANTERIOR CERVICAL DECOMP/DISCECTOMY FUSION N/A 10/21/2016   Procedure: ANTERIOR CERVICAL DECOMPRESSION/DISCECTOMY FUSION, INTERBODY PROSTHESIS,PLATE CERVICAL FOUR- CERVICAL FIVE, CERVICAL FIVE- CERVICAL SIX;  Surgeon: Tressie Stalker, MD;  Location: [facility] OR;  Service: Neurosurgery;  Laterality: N/A;  . CHOLECYSTECTOMY  08/02/2011   Procedure: LAPAROSCOPIC CHOLECYSTECTOMY;  Surgeon: Shelly Rubenstein, MD;  Location: [facility] OR;  Service: General;;  . COLONOSCOPY  2003  . ESOPHAGOGASTRODUODENOSCOPY  2003  . ESOPHAGOGASTRODUODENOSCOPY  07/28/2011   Procedure: ESOPHAGOGASTRODUODENOSCOPY (EGD);  Surgeon: [name], MD;  Location: [facility] ENDOSCOPY;  Service: Endoscopy;   Laterality: N/A;  . MYRINGOTOMY  1979; 1980   bilaterally  . TONSILLECTOMY AND ADENOIDECTOMY  ~ 1980    SOCIAL HISTORY: Social History   Tobacco Use  . Smoking status: Former Smoker    Packs/day: 3.00    Years: 3.00    Pack years: 9.00    Types: Cigarettes    Last attempt to quit: 08/18/1987    Years since quitting: 30.7  . Smokeless tobacco: Never Used  Substance Use Topics  . Alcohol use: Yes    Alcohol/week: 0.0 standard drinks    Comment: 07/04/11 "glass of wine or spirits once a month"  . Drug use: Yes    Types: Marijuana    Comment: have not used [name] a 1.5 years  FAMILY HISTORY: Family History  Problem Relation Age of Onset  . Coronary artery disease Father   . Stroke Father   . Cancer Father   . High blood pressure Father   . Alcoholism Father   . Diabetes Mother   . Obesity Mother   . Cancer Maternal Grandmother        bone  . Cancer Other        Stomach -Great-great grandmother    ROS: Review of Systems  Constitutional: Positive for weight loss.  Gastrointestinal: Negative for nausea and vomiting.  Musculoskeletal:       Negative muscle weakness  Endo/Heme/Allergies:       Positive polyphagia Negative hypoglycemia  Psychiatric/Behavioral: Positive for depression. Negative for suicidal ideas.    PHYSICAL EXAM: Blood pressure 134/85, pulse 71, temperature 97.9 F (36.6 C), temperature source Oral, height 5\' 8"  (1.727 m), weight 300 lb (136.1 kg), SpO2 98 %. Body mass index is 45.61 kg/m. Physical Exam  Constitutional: He is oriented to person, place, and time. He appears well-developed and well-nourished.  Cardiovascular: Normal rate.  Pulmonary/Chest: Effort normal.  Musculoskeletal: Normal range of motion.  Neurological: He is oriented to person, place, and time.  Skin: Skin is warm and dry.  Psychiatric: He has a normal mood and affect. His behavior is normal.  Vitals reviewed.   RECENT LABS AND TESTS: BMET    Component Value  Date/Time   NA 143 02/16/2018 1037   K 3.9 02/16/2018 1037   CL 104 02/16/2018 1037   CO2 24 02/16/2018 1037   GLUCOSE 84 02/16/2018 1037   GLUCOSE 88 08/24/2017 1458   BUN 14 02/16/2018 1037   CREATININE 1.10 02/16/2018 1037   CREATININE 1.02 08/24/2017 1458   CALCIUM 9.4 02/16/2018 1037   GFRNONAA 80 02/16/2018 1037   GFRNONAA >89 07/18/2015 1656   GFRAA 93 02/16/2018 1037   GFRAA >89 07/18/2015 1656   Lab Results  Component Value Date   HGBA1C 5.5 02/16/2018   HGBA1C 5.7 (H) 09/15/2017   HGBA1C 5.6 08/24/2017   HGBA1C 6.5 (H) 02/18/2017   HGBA1C 5.5 09/17/2016   Lab Results  Component Value Date   INSULIN 60.7 (H) 02/16/2018   INSULIN 110.6 (H) 09/15/2017   CBC    Component Value Date/Time   WBC 6.3 09/15/2017 0902   WBC 6.2 10/12/2016 1441   RBC 5.74 09/15/2017 0902   RBC 5.27 10/12/2016 1441   HGB 16.6 09/15/2017 0902   HCT 49.5 09/15/2017 0902   PLT 151 10/12/2016 1441   PLT 158 09/17/2016 1611   MCV 86 09/15/2017 0902   MCH 28.9 09/15/2017 0902   MCH 28.1 10/12/2016 1441   MCHC 33.5 09/15/2017 0902   MCHC 34.0 10/12/2016 1441   RDW 14.7 09/15/2017 0902   LYMPHSABS 2.2 09/15/2017 0902   MONOABS 0.5 12/05/2014 1538   EOSABS 0.1 09/15/2017 0902   BASOSABS 0.0 09/15/2017 0902   Iron/TIBC/Ferritin/ %Sat No results found for: IRON, TIBC, FERRITIN, IRONPCTSAT Lipid Panel     Component Value Date/Time   CHOL 116 02/16/2018 1037   TRIG 340 (H) 02/16/2018 1037   HDL 27 (L) 02/16/2018 1037   CHOLHDL 3.4 08/24/2017 1458   VLDL 70 (H) 10/15/2015 1112   LDLCALC 21 02/16/2018 1037   LDLCALC 57 08/24/2017 1458   Hepatic Function Panel     Component Value Date/Time   PROT 6.7 02/16/2018 1037   ALBUMIN 4.4 02/16/2018 1037   AST 26 02/16/2018 1037   ALT 59 (H)  02/16/2018 1037   ALKPHOS 62 02/16/2018 1037   BILITOT 0.4 02/16/2018 1037   BILIDIR 0.1 04/11/2015 1638   IBILI 0.6 04/11/2015 1638      Component Value Date/Time   TSH 2.080 09/15/2017 0902    TSH 3.190 09/17/2016 1611   TSH 2.339 12/05/2014 1538  Results for Brandon [name], Brandon [name] "Brandon [name]" (MRN [id number]) as of 04/25/2018 16:29  Ref. Range 02/16/2018 10:37  Vitamin D, 25-Hydroxy Latest Ref Range: 30.0 - 100.0 ng/mL 28.6 (L)    ASSESSMENT AND PLAN: Vitamin D deficiency - Plan: Vitamin D, Ergocalciferol, (DRISDOL) 50000 units CAPS capsule  Type 2 diabetes mellitus without complication, without long-term current use of insulin (HCC)  Other depression - with emotional eating  At risk for osteoporosis  Class 3 severe obesity with serious comorbidity and body mass index (BMI) of 45.0 to 49.9 [name] adult, unspecified obesity type (HCC)  PLAN:  Vitamin D Deficiency Brandon [name] was informed that low vitamin D levels contributes to fatigue and are associated with obesity, breast, and colon cancer. Brandon [name] agrees to continue taking prescription Vit D @50 ,000 IU every week #4 and we will refill for 1 month. He will follow up for routine testing of vitamin D, at least 2-3 times per year. He was informed of the risk of over-replacement of vitamin D and agrees to not increase his dose unless he discusses this with [place] first. Brandon [name] agrees to follow up with our clinic [name] 3 weeks.  At risk for osteopenia and osteoporosis Brandon [name] was given extended (15 minutes) osteoporosis prevention counseling today. Brandon [name] is at risk for osteopenia and osteoporsis due to his vitamin D deficiency. He was encouraged to take his vitamin D and follow his higher calcium diet and increase strengthening exercise to help strengthen his bones and decrease his risk of osteopenia and osteoporosis.  Diabetes II Brandon [name] has been given extensive diabetes education by myself today including ideal fasting and post-prandial blood glucose readings, individual ideal Hgb A1c goals and hypoglycemia prevention. We discussed the importance of good blood sugar control to decrease the likelihood of diabetic complications such as nephropathy,  neuropathy, limb loss, blindness, coronary artery disease, and death. We discussed the importance of intensive lifestyle modification including diet, exercise and weight loss as the first line treatment for diabetes. Brandon [name] agrees to continue Victoza at 0.6 mg daily, and he agrees to continue metformin 500 mg BID (per pcp). Brandon [name] agrees to follow up with our clinic [name] 3 weeks.  Depression with Emotional Eating Behaviors We discussed behavior modification techniques today to help Brandon [name] deal with his emotional eating and depression. Brandon [name] was encouraged to see his therapist. We discussed possibly seeing Brandon [name], our bariatric psychologist [name] the future if his insurance cover this, and he seems to be admirable. Brandon [name] agrees to follow up with our clinic [name] 3 weeks.  Obesity Jamoni is currently [name] the action stage of change. As such, his goal is to continue with weight loss efforts He has agreed to follow the Category 4 plan + 500 calories Leiden has been instructed to work up to a goal of 150 minutes of combined cardio and strengthening exercise per week for weight loss and overall health benefits. We discussed the following Behavioral Modification Strategies today: increasing lean protein intake, decreasing simple carbohydrates, dealing with family or coworker sabotage, and keeping healthy foods [name] the home Ansen was encouraged adherence to meal plan.  Aldor has agreed to follow up with our clinic [name] 3 weeks. He was informed of the  importance of frequent follow up visits to maximize his success with intensive lifestyle modifications for his multiple health conditions.   OBESITY BEHAVIORAL INTERVENTION VISIT  Today's visit was # 11   Starting weight: 305 lbs Starting date: 09/15/17 Today's weight : 300 lbs  Today's date: 04/20/2018 Total lbs lost to date: 5    ASK: We discussed the diagnosis of obesity with Dahlia Byes today and [name] agreed to give [place] permission to  discuss obesity behavioral modification therapy today.  ASSESS: Tirrell has the diagnosis of obesity and his BMI today is 45.63 Arval is [name] the action stage of change   ADVISE: Jansel was educated on the multiple health risks of obesity as well as the benefit of weight loss to improve his health. He was advised of the need for long term treatment and the importance of lifestyle modifications to improve his current health and to decrease his risk of future health problems.  AGREE: Multiple dietary modification options and treatment options were discussed and  Kein agreed to follow the recommendations documented [name] the above note.  ARRANGE: Eulan was educated on the importance of frequent visits to treat obesity as outlined per CMS and USPSTF guidelines and agreed to schedule his next follow up appointment today.  I, Burt Knack, am acting as transcriptionist for Quillian Quince, MD  I have reviewed the above documentation for accuracy and completeness, and I agree with the above. -Quillian Quince, MD

## 2018-05-03 ENCOUNTER — Ambulatory Visit: Payer: 59 | Admitting: Adult Health

## 2018-05-03 ENCOUNTER — Encounter: Payer: Self-pay | Admitting: Adult Health

## 2018-05-03 VITALS — BP 153/93 | HR 66 | Ht 68.0 in

## 2018-05-03 DIAGNOSIS — Z9989 Dependence on other enabling machines and devices: Secondary | ICD-10-CM

## 2018-05-03 DIAGNOSIS — G4733 Obstructive sleep apnea (adult) (pediatric): Secondary | ICD-10-CM

## 2018-05-03 NOTE — Patient Instructions (Signed)
Your Plan:  Continue using CPAP nightly and >4 hours each night If your symptoms worsen or you develop new symptoms please let us know.   Thank you for coming to see us at [facility]. I hope we have been able to provide you high quality care today.  You may receive a patient satisfaction survey over the next few weeks. We would appreciate your feedback and comments so that we may continue to improve ourselves and the health of our patients.  

## 2018-05-03 NOTE — Progress Notes (Signed)
PATIENT: Brandon [name] DOB: 05/25/1971  REASON FOR VISIT: follow up HISTORY FROM: patient  HISTORY OF PRESENT ILLNESS: Today 05/03/18:  Brandon [name] is a 47 year old male with a history of obstructive sleep apnea on CPAP.  He returns today for follow-up.  His CPAP download indicates that he uses machine 30 out of 30 days for compliance of 100%.  He uses machine greater than 4 hours 26 out of 30 days for compliance of 87%.  On average he uses his machine 6 hours and 31 minutes.  His residual AHI is 1.8 on 5 to 20 cm of water with EPR 2.  His leak in the 95th percentile is 26.2.  Patient states that he was supposed to have back surgery for 3 ruptured disc.  He reports there has been issues with his insurance company so his surgery was canceled and he is waiting for it to be rescheduled.  He reports that he is currently taking Valium and Percocet.  Reports that he is taking perocet every 4 hours. Even sets an alarm during the night to wake up and take it. States that he takes Valium every 8 hours.    Reports that he took Percocet at 12:00 prior to this visit.  Upon bringing the patient back to the room the nurse had to use a wheelchair as his gait was unsteady.  The patient states that his gait is unsteady because he is on pain medicine and valium.  HISTORY (copied from Dr. Oliva Bustard note) I have the pleasure of seeing Brandon [name] is 100% compliance for CPAP use by days, 87% compliance by hours, averaging 6 hours and 31 minutes at night. His residual AHI is 1.8, the 95th percentile pressure was 15.4 cm water. He is using an AutoSet between 5 and 20 cm water with 2 cm EPR. Brandon [name] reports that his machine but she has now on for almost 5 years was a referred pressure machine which continues to emit a scent of tobacco smoke.    REVIEW OF SYSTEMS: Out of a complete 14 system review of symptoms, the patient complains only of the following symptoms, and all other reviewed systems are negative.  Epworth  sleepiness score 13 Fatigue light sensitivity, shortness of breath, constipation, nausea, apnea, daytime, snoring, sleep talking, aching muscles, muscle cramps, neck pain, neck stiffness, painful urination, frequency of urination, heat intolerance, memory loss, dizziness, speech difficulty, weakness, tremors  ALLERGIES: Allergies  Allergen Reactions  . Penicillin G Anaphylaxis    Gets extremely high fevers.  . Sulfa Antibiotics Other (See Comments)    It will kill him  . Amoxicillin   . Cephalosporins Other (See Comments)    Reaction unspecified.  . Quinolones Other (See Comments)    Reaction unspecified  . Sulfamethoxazole Swelling    HOME MEDICATIONS: Outpatient Medications Prior to Visit  Medication Sig Dispense Refill  . atorvastatin (LIPITOR) 20 MG tablet TAKE 1 TABLET (20 MG TOTAL) BY MOUTH AT BEDTIME. 30 tablet 0  . colesevelam (WELCHOL) 625 MG tablet Take 3 tablets (1,875 mg total) by mouth daily with breakfast. 540 tablet 1  . cyclobenzaprine (AMRIX) 15 MG 24 hr capsule Take 2 capsules (30 mg total) by mouth daily. 60 capsule 5  . diazepam (VALIUM) 5 MG tablet Take 1 tablet (5 mg total) by mouth every 8 (eight) hours as needed for anxiety. 20 tablet 0  . diclofenac (VOLTAREN) 75 MG EC tablet Take 1 tablet (75 mg total) by mouth 2 (two) times daily.  180 tablet 1  . gabapentin (NEURONTIN) 300 MG capsule Take 3 capsules (900 mg total) by mouth 2 (two) times daily. 540 capsule 1  . Insulin Pen Needle (BD PEN NEEDLE NANO U/F) 32G X 4 MM MISC 1 Package by Does not apply route 2 (two) times daily. 100 each 0  . liraglutide (VICTOZA) 18 MG/3ML SOPN Inject 0.1 mLs (0.6 mg total) into the skin every morning. 1 pen 0  . lisinopril-hydrochlorothiazide (PRINZIDE,ZESTORETIC) 10-12.5 MG tablet Take 1 tablet by mouth at bedtime. 0000 90 tablet 1  . metFORMIN (GLUCOPHAGE-XR) 500 MG 24 hr tablet TAKE 2 TABLETS (1,000 MG TOTAL) BY MOUTH 2 (TWO) TIMES DAILY. 360 tablet 1  . NON FORMULARY CPAP  machine at bedtime & sleep    . oxyCODONE-acetaminophen (PERCOCET) 10-325 MG tablet Take 1 tablet by mouth every 8 (eight) hours as needed for pain. (Patient taking differently: Take 1 tablet by mouth every 4 (four) hours as needed for pain. ) 30 tablet 0  . Vitamin D, Ergocalciferol, (DRISDOL) 50000 units CAPS capsule Take 1 capsule (50,000 Units total) by mouth every 7 (seven) days. 4 capsule 0   No facility-administered medications prior to visit.     PAST MEDICAL HISTORY: Past Medical History:  Diagnosis Date  . ADEM (acute disseminated encephalomyelitis)   . Allergy   . Anxiety 2005  . Complication of anesthesia   . Depression 2005  . Difficult intubation    difficult intubating and exbutating "throat Clinched around tube"  . Headache(784.0)   . Hearing loss   . Hyperinsulinemia 07/04/11   "I'm on metformin cause I produce too much insulin"  . Hyperlipidemia   . Hypertension   . Mononucleosis, infectious, with hepatitis 06/2011  . MS (multiple sclerosis) (HCC)    patinet has ADEM not MS per Patient  . Nausea    occasional per history form 08/18/11  . Neuromuscular disorder (HCC)   . Pneumonia 2010  . Sleep apnea    uses CPAP at home  . Stroke (HCC) 2000   TIA  . TIA (transient ischemic attack) 2000   denies residual  . TIA (transient ischemic attack)     PAST SURGICAL HISTORY: Past Surgical History:  Procedure Laterality Date  . ANTERIOR CERVICAL DECOMP/DISCECTOMY FUSION N/A 10/21/2016   Procedure: ANTERIOR CERVICAL DECOMPRESSION/DISCECTOMY FUSION, INTERBODY PROSTHESIS,PLATE CERVICAL FOUR- CERVICAL FIVE, CERVICAL FIVE- CERVICAL SIX;  Surgeon: Tressie Stalker, MD;  Location: [facility] OR;  Service: Neurosurgery;  Laterality: N/A;  . CHOLECYSTECTOMY  08/02/2011   Procedure: LAPAROSCOPIC CHOLECYSTECTOMY;  Surgeon: Shelly Rubenstein, MD;  Location: [facility] OR;  Service: General;;  . COLONOSCOPY  2003  . ESOPHAGOGASTRODUODENOSCOPY  2003  . ESOPHAGOGASTRODUODENOSCOPY  07/28/2011    Procedure: ESOPHAGOGASTRODUODENOSCOPY (EGD);  Surgeon: [name], MD;  Location: [facility] ENDOSCOPY;  Service: Endoscopy;  Laterality: N/A;  . MYRINGOTOMY  1979; 1980   bilaterally  . TONSILLECTOMY AND ADENOIDECTOMY  ~ 1980    FAMILY HISTORY: Family History  Problem Relation Age of Onset  . Coronary artery disease Father   . Stroke Father   . Cancer Father   . High blood pressure Father   . Alcoholism Father   . Diabetes Mother   . Obesity Mother   . Cancer Maternal Grandmother        bone  . Cancer Other        Stomach -Great-great grandmother    SOCIAL HISTORY: Social History   Socioeconomic History  . Marital status: Significant Other    Spouse name: Not  on file  . Number of children: 0  . Years of education: college  . Highest education level: Not on file  Occupational History  . Occupation: disabled   Social Needs  . Financial resource strain: Not on file  . Food insecurity:    Worry: Not on file    Inability: Not on file  . Transportation needs:    Medical: Not on file    Non-medical: Not on file  Tobacco Use  . Smoking status: Former Smoker    Packs/day: 3.00    Years: 3.00    Pack years: 9.00    Types: Cigarettes    Last attempt to quit: 08/18/1987    Years since quitting: 30.7  . Smokeless tobacco: Never Used  Substance and Sexual Activity  . Alcohol use: Yes    Alcohol/week: 0.0 standard drinks    Comment: 07/04/11 "glass of wine or spirits once a month"  . Drug use: Yes    Types: Marijuana    Comment: have not used in a 1.5 years  . Sexual activity: Yes    Partners: Female  Lifestyle  . Physical activity:    Days per week: Not on file    Minutes per session: Not on file  . Stress: Not on file  Relationships  . Social connections:    Talks on phone: Not on file    Gets together: Not on file    Attends religious service: Not on file    Active member of club or organization: Not on file    Attends meetings of clubs or organizations: Not on file      Relationship status: Not on file  . Intimate partner violence:    Fear of current or ex partner: Not on file    Emotionally abused: Not on file    Physically abused: Not on file    Forced sexual activity: Not on file  Other Topics Concern  . Not on file  Social History Narrative   Lives in [place] with 2 domestic male partners "his family unit"   Caffeine: minimal      PHYSICAL EXAM  Vitals:   05/03/18 1248  BP: (!) 153/93  Pulse: 66  Height: 5\' 8"  (1.727 m)   Body mass index is 45.61 kg/m.  Generalized: Well developed, in no acute distress   Neurological examination  Mentation: Alert oriented to time, place, history taking. Follows all commands, talkative Cranial nerve II-XII: Extraocular movements were full, visual field were full on confrontational test. Facial sensation and strength were normal. Uvula tongue midline. Head turning and shoulder shrug  were normal and symmetric.  Neck circumference 20-1/2 inches, Mallampati 3+ Motor: The motor testing reveals 5 over 5 strength of all 4 extremities. Good symmetric motor tone is noted throughout.  Sensory: Sensory testing is intact to soft touch on all 4 extremities. No evidence of extinction is noted.  Coordination: Cerebellar testing reveals good finger-nose-finger and heel-to-shin bilaterally.  Gait and station: Patient is in a wheelchair.  Gait not attempted   DIAGNOSTIC DATA (LABS, IMAGING, TESTING) - I reviewed patient records, labs, notes, testing and imaging myself where available.  Lab Results  Component Value Date   WBC 6.3 09/15/2017   HGB 16.6 09/15/2017   HCT 49.5 09/15/2017   MCV 86 09/15/2017   PLT 151 10/12/2016      Component Value Date/Time   NA 143 02/16/2018 1037   K 3.9 02/16/2018 1037   CL 104 02/16/2018 1037   CO2 24  02/16/2018 1037   GLUCOSE 84 02/16/2018 1037   GLUCOSE 88 08/24/2017 1458   BUN 14 02/16/2018 1037   CREATININE 1.10 02/16/2018 1037   CREATININE 1.02 08/24/2017  1458   CALCIUM 9.4 02/16/2018 1037   PROT 6.7 02/16/2018 1037   ALBUMIN 4.4 02/16/2018 1037   AST 26 02/16/2018 1037   ALT 59 (H) 02/16/2018 1037   ALKPHOS 62 02/16/2018 1037   BILITOT 0.4 02/16/2018 1037   GFRNONAA 80 02/16/2018 1037   GFRNONAA >89 07/18/2015 1656   GFRAA 93 02/16/2018 1037   GFRAA >89 07/18/2015 1656   Lab Results  Component Value Date   CHOL 116 02/16/2018   HDL 27 (L) 02/16/2018   LDLCALC 21 02/16/2018   TRIG 340 (H) 02/16/2018   CHOLHDL 3.4 08/24/2017   Lab Results  Component Value Date   HGBA1C 5.5 02/16/2018   Lab Results  Component Value Date   VITAMINB12 329 09/15/2017   Lab Results  Component Value Date   TSH 2.080 09/15/2017      ASSESSMENT AND PLAN 47 y.o. year old male  has a past medical history of ADEM (acute disseminated encephalomyelitis), Allergy, Anxiety (2005), Complication of anesthesia, Depression (2005), Difficult intubation, Headache(784.0), Hearing loss, Hyperinsulinemia (07/04/11), Hyperlipidemia, Hypertension, Mononucleosis, infectious, with hepatitis (06/2011), MS (multiple sclerosis) (HCC), Nausea, Neuromuscular disorder (HCC), Pneumonia (2010), Sleep apnea, Stroke (HCC) (2000), TIA (transient ischemic attack) (2000), and TIA (transient ischemic attack). here with :  1.  Obstructive sleep apnea on CPAP  The patient CPAP download shows excellent compliance and good treatment of his apnea.  He is encouraged to continue using the CPAP nightly and greater than 4 hours each night.  The patient is taking Percocet every 4 hours.  He gait today was unsteady.  I will forward my note to his primary care provider.  He is advised that if his symptoms worsen or he develops new symptoms he should let us know.  He will follow-up in 1 year or sooner if needed.   I spent 15 minutes with the patient. 50% of this time was spent reviewing CPAP download   Butch Penny, MSN, NP-C 05/03/2018, 1:00 PM [facility] [address] [place], [place] [address] [phone]

## 2018-05-09 ENCOUNTER — Other Ambulatory Visit (INDEPENDENT_AMBULATORY_CARE_PROVIDER_SITE_OTHER): Payer: Self-pay | Admitting: Family Medicine

## 2018-05-09 ENCOUNTER — Other Ambulatory Visit: Payer: Self-pay | Admitting: Neurosurgery

## 2018-05-09 DIAGNOSIS — E559 Vitamin D deficiency, unspecified: Secondary | ICD-10-CM

## 2018-05-11 ENCOUNTER — Ambulatory Visit: Admit: 2018-05-11 | Payer: Medicare Other | Admitting: Neurosurgery

## 2018-05-11 SURGERY — ANTERIOR CERVICAL DECOMPRESSION/DISCECTOMY FUSION 1 LEVEL/HARDWARE REMOVAL
Anesthesia: General

## 2018-05-13 ENCOUNTER — Other Ambulatory Visit: Payer: Self-pay | Admitting: Neurosurgery

## 2018-05-13 ENCOUNTER — Encounter (HOSPITAL_COMMUNITY): Payer: Self-pay

## 2018-05-13 ENCOUNTER — Other Ambulatory Visit: Payer: Self-pay

## 2018-05-13 ENCOUNTER — Encounter (HOSPITAL_COMMUNITY)
Admission: RE | Admit: 2018-05-13 | Discharge: 2018-05-13 | Disposition: A | Discharge status: Home / Self Care | Payer: 59 | Source: Ambulatory Visit | Attending: Neurosurgery | Admitting: Neurosurgery

## 2018-05-13 DIAGNOSIS — Z7984 Long term (current) use of oral hypoglycemic drugs: Secondary | ICD-10-CM | POA: Insufficient documentation

## 2018-05-13 DIAGNOSIS — F329 Major depressive disorder, single episode, unspecified: Secondary | ICD-10-CM | POA: Diagnosis not present

## 2018-05-13 DIAGNOSIS — Z981 Arthrodesis status: Secondary | ICD-10-CM | POA: Insufficient documentation

## 2018-05-13 DIAGNOSIS — H9190 Unspecified hearing loss, unspecified ear: Secondary | ICD-10-CM | POA: Diagnosis not present

## 2018-05-13 DIAGNOSIS — I10 Essential (primary) hypertension: Secondary | ICD-10-CM | POA: Diagnosis not present

## 2018-05-13 DIAGNOSIS — G4733 Obstructive sleep apnea (adult) (pediatric): Secondary | ICD-10-CM | POA: Insufficient documentation

## 2018-05-13 DIAGNOSIS — E785 Hyperlipidemia, unspecified: Secondary | ICD-10-CM | POA: Insufficient documentation

## 2018-05-13 DIAGNOSIS — Z87891 Personal history of nicotine dependence: Secondary | ICD-10-CM | POA: Insufficient documentation

## 2018-05-13 DIAGNOSIS — M5020 Other cervical disc displacement, unspecified cervical region: Secondary | ICD-10-CM | POA: Diagnosis not present

## 2018-05-13 DIAGNOSIS — Z79899 Other long term (current) drug therapy: Secondary | ICD-10-CM | POA: Diagnosis not present

## 2018-05-13 DIAGNOSIS — Z9049 Acquired absence of other specified parts of digestive tract: Secondary | ICD-10-CM | POA: Insufficient documentation

## 2018-05-13 DIAGNOSIS — Z8673 Personal history of transient ischemic attack (TIA), and cerebral infarction without residual deficits: Secondary | ICD-10-CM | POA: Diagnosis not present

## 2018-05-13 DIAGNOSIS — F419 Anxiety disorder, unspecified: Secondary | ICD-10-CM | POA: Diagnosis not present

## 2018-05-13 DIAGNOSIS — E119 Type 2 diabetes mellitus without complications: Secondary | ICD-10-CM | POA: Insufficient documentation

## 2018-05-13 DIAGNOSIS — Z01818 Encounter for other preprocedural examination: Secondary | ICD-10-CM | POA: Diagnosis not present

## 2018-05-13 HISTORY — DX: Constipation, unspecified: K59.00

## 2018-05-13 HISTORY — DX: Dyspnea, unspecified: R06.00

## 2018-05-13 HISTORY — DX: Type 2 diabetes mellitus without complications: E11.9

## 2018-05-13 LAB — CBC
HCT: 47 % (ref 39.0–52.0)
Hemoglobin: 15.3 g/dL (ref 13.0–17.0)
MCH: 26.8 pg (ref 26.0–34.0)
MCHC: 32.6 g/dL (ref 30.0–36.0)
MCV: 82.3 fL (ref 80.0–100.0)
Platelets: 161 10*3/uL (ref 150–400)
RBC: 5.71 MIL/uL (ref 4.22–5.81)
RDW: 13.4 % (ref 11.5–15.5)
WBC: 5 10*3/uL (ref 4.0–10.5)
nRBC: 0 % (ref 0.0–0.2)

## 2018-05-13 LAB — TYPE AND SCREEN
ABO/RH(D): O POS
Antibody Screen: NEGATIVE

## 2018-05-13 LAB — SURGICAL PCR SCREEN
MRSA, PCR: NEGATIVE
Staphylococcus aureus: NEGATIVE

## 2018-05-13 LAB — COMPREHENSIVE METABOLIC PANEL
ALT: 62 U/L — ABNORMAL HIGH (ref 0–44)
AST: 29 U/L (ref 15–41)
Albumin: 4 g/dL (ref 3.5–5.0)
Alkaline Phosphatase: 49 U/L (ref 38–126)
Anion gap: 7 (ref 5–15)
BUN: 20 mg/dL (ref 6–20)
CO2: 28 mmol/L (ref 22–32)
Calcium: 9.3 mg/dL (ref 8.9–10.3)
Chloride: 104 mmol/L (ref 98–111)
Creatinine, Ser: 0.99 mg/dL (ref 0.61–1.24)
GFR calc Af Amer: >60 mL/min (ref >60)
GFR calc non Af Amer: >60 mL/min (ref >60)
Glucose, Bld: 83 mg/dL (ref 70–99)
Potassium: 3.2 mmol/L — ABNORMAL LOW (ref 3.5–5.1)
Sodium: 139 mmol/L (ref 135–145)
Total Bilirubin: 0.7 mg/dL (ref 0.3–1.2)
Total Protein: 6.5 g/dL (ref 6.5–8.1)

## 2018-05-13 LAB — GLUCOSE, CAPILLARY: Glucose-Capillary: 88 mg/dL (ref 70–99)

## 2018-05-13 LAB — HEMOGLOBIN A1C
Hgb A1c MFr Bld: 5 % (ref 4.8–5.6)
Mean Plasma Glucose: 96.8 mg/dL

## 2018-05-13 NOTE — Pre-Procedure Instructions (Signed)
Brandon [name]  05/13/2018    Your procedure is scheduled on Thursday, May 19, 2018 at 2:55 PM.   Report to [facility] Entrance "A" Admitting Office at 12:55 PM.   Call this number if you have problems the morning of surgery: [phone]   Questions prior to day of surgery, please call [phone] between 8 & 4 PM.   Remember:  Do not eat or drink after midnight Wednesday, 05/18/18.  Take these medicines the morning of surgery with A SIP OF WATER: Cyclobenzaprine (Amrix), Gabapentin (Neurontin), Oxycodone - if needed  Stop NSAIDS (Diclofenac, Voltaren, Ibuprofen, Aleve, etc) as of today prior to surgery. Do not use Herbal medications, Aspirin products or Multivitamins prior to surgery.  Morning of surgery do not take Metformin or Victoza.   How to Manage Your Diabetes Before Surgery   Why is it important to control my blood sugar before and after surgery?   Improving blood sugar levels before and after surgery helps healing and can limit problems.  A way of improving blood sugar control is eating a healthy diet by:  - Eating less sugar and carbohydrates  - Increasing activity/exercise  - Talk with your doctor about reaching your blood sugar goals  High blood sugars (greater than 180 mg/dL) can raise your risk of infections and slow down your recovery so you will need to focus on controlling your diabetes during the weeks before surgery.  Make sure that the doctor who takes care of your diabetes knows about your planned surgery including the date and location.  How do I manage my blood sugars before surgery?   Check your blood sugar at least 4 times a day, 2 days before surgery to make sure that they are not too high or low.  Check your blood sugar the morning of your surgery when you wake up and every 2 hours until you get to the Short-Stay unit.  Treat a low blood sugar (less than 70 mg/dL) with 1/2 cup of clear juice (cranberry or apple), 4 glucose  tablets, OR glucose gel.  Recheck blood sugar in 15 minutes after treatment (to make sure it is greater than 70 mg/dL).  If blood sugar is not greater than 70 mg/dL on re-check, call [phone] for further instructions.   Report your blood sugar to the Short-Stay nurse when you get to Short-Stay.  References:  [facility], 2007 "How to Manage your Diabetes Before and After Surgery".    Do not wear jewelry.  Do not wear lotions, powders, cologne or deodorant.  Men may shave face and neck.  Do not bring valuables to the hospital.  [facility] is not responsible for any belongings or valuables.  Contacts, dentures or bridgework may not be worn into surgery.  Leave your suitcase in the car.  After surgery it may be brought to your room.  For patients admitted to the hospital, discharge time will be determined by your treatment team.  Patients discharged the day of surgery will not be allowed to drive home.   [facility] - Preparing for Surgery  Before surgery, you can play an important role.  Because skin is not sterile, your skin needs to be as free of germs as possible.  You can reduce the number of germs on you skin by washing with CHG (chlorahexidine gluconate) soap before surgery.  CHG is an antiseptic cleaner which kills germs and bonds with the skin to continue killing germs even after washing.  Oral Hygiene is also important in reducing the risk of infection.  Remember to brush your teeth with your regular toothpaste the morning of surgery.  Please DO NOT use if you have an allergy to CHG or antibacterial soaps.  If your skin becomes reddened/irritated stop using the CHG and inform your nurse when you arrive at Short Stay.  Do not shave (including legs and underarms) for at least 48 hours prior to the first CHG shower.  You may shave your face.  Please follow these instructions carefully:   1.  Shower with CHG Soap the night before surgery and the  morning of Surgery.  2.  If you choose to wash your hair, wash your hair first as usual with your normal shampoo.  3.  After you shampoo, rinse your hair and body thoroughly to remove the shampoo. 4.  Use CHG as you would any other liquid soap.  You can apply chg directly to the skin and wash gently with a      scrungie or washcloth.           5.  Apply the CHG Soap to your body ONLY FROM THE NECK DOWN.   Do not use on open wounds or open sores. Avoid contact with your eyes, ears, mouth and genitals (private parts).  Wash genitals (private parts) with your normal soap.  6.  Wash thoroughly, paying special attention to the area where your surgery will be performed.  7.  Thoroughly rinse your body with warm water from the neck down.  8.  DO NOT shower/wash with your normal soap after using and rinsing off the CHG Soap.  9.  Pat yourself dry with a clean towel.            10.  Wear clean pajamas.            11.  Place clean sheets on your bed the night of your first shower and do not sleep with pets.  Day of Surgery  Shower as above. Do not apply any lotions/deodorants the morning of surgery.   Please wear clean clothes to the hospital. Remember to brush your teeth with toothpaste.   Please read over the fact sheets that you were given.

## 2018-05-13 NOTE — Progress Notes (Signed)
Pt denies cardiac history or chest pain. Pt has hx of HTN and Type 2 diabetic. Pt states he does not check his blood sugar at home. Last A1C was 5.5 on 02/16/18.

## 2018-05-15 ENCOUNTER — Emergency Department (HOSPITAL_COMMUNITY): Payer: 59

## 2018-05-15 ENCOUNTER — Other Ambulatory Visit: Payer: Self-pay

## 2018-05-15 ENCOUNTER — Encounter (HOSPITAL_COMMUNITY): Payer: Self-pay | Admitting: Emergency Medicine

## 2018-05-15 ENCOUNTER — Emergency Department (HOSPITAL_COMMUNITY)
Admission: EM | Admit: 2018-05-15 | Discharge: 2018-05-16 | Disposition: A | Discharge status: Home / Self Care | Payer: 59 | Attending: Emergency Medicine | Admitting: Emergency Medicine

## 2018-05-15 DIAGNOSIS — Z794 Long term (current) use of insulin: Secondary | ICD-10-CM | POA: Diagnosis not present

## 2018-05-15 DIAGNOSIS — M542 Cervicalgia: Secondary | ICD-10-CM | POA: Diagnosis present

## 2018-05-15 DIAGNOSIS — E119 Type 2 diabetes mellitus without complications: Secondary | ICD-10-CM | POA: Insufficient documentation

## 2018-05-15 DIAGNOSIS — Z79899 Other long term (current) drug therapy: Secondary | ICD-10-CM | POA: Diagnosis not present

## 2018-05-15 DIAGNOSIS — Z87891 Personal history of nicotine dependence: Secondary | ICD-10-CM | POA: Diagnosis not present

## 2018-05-15 DIAGNOSIS — I10 Essential (primary) hypertension: Secondary | ICD-10-CM | POA: Insufficient documentation

## 2018-05-15 DIAGNOSIS — G35 Multiple sclerosis: Secondary | ICD-10-CM | POA: Diagnosis not present

## 2018-05-15 DIAGNOSIS — Z8673 Personal history of transient ischemic attack (TIA), and cerebral infarction without residual deficits: Secondary | ICD-10-CM | POA: Insufficient documentation

## 2018-05-15 DIAGNOSIS — R0789 Other chest pain: Secondary | ICD-10-CM | POA: Diagnosis not present

## 2018-05-15 MED ORDER — GABAPENTIN 300 MG PO CAPS
900.0000 mg | ORAL_CAPSULE | Freq: Once | ORAL | Status: AC
  Administered 2018-05-15: 900 mg via ORAL
  Filled 2018-05-15: qty 3

## 2018-05-15 MED ORDER — DOCUSATE SODIUM 100 MG PO CAPS
100.0000 mg | ORAL_CAPSULE | Freq: Once | ORAL | Status: AC
  Administered 2018-05-15: 100 mg via ORAL
  Filled 2018-05-15: qty 1

## 2018-05-15 MED ORDER — METFORMIN HCL 500 MG PO TABS
1000.0000 mg | ORAL_TABLET | Freq: Once | ORAL | Status: AC
  Administered 2018-05-15: 1000 mg via ORAL

## 2018-05-15 MED ORDER — OXYCODONE-ACETAMINOPHEN 5-325 MG PO TABS
2.0000 | ORAL_TABLET | Freq: Once | ORAL | Status: AC
  Administered 2018-05-15: 2 via ORAL
  Filled 2018-05-15: qty 2
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
## 2018-05-15 NOTE — ED Notes (Signed)
Patient transported to X-ray 
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
## 2018-05-16 NOTE — Anesthesia Preprocedure Evaluation (Addendum)
Anesthesia Evaluation  Patient identified by MRN, date of birth, ID band Patient awake    Reviewed: Allergy & Precautions, NPO status , Patient's Chart, lab work & pertinent test results  History of Anesthesia Complications (+) DIFFICULT AIRWAYNegative for: history of anesthetic complications  Airway Mallampati: III  TM Distance: >3 FB Neck ROM: Limited    Dental no notable dental hx.    Pulmonary sleep apnea and Continuous Positive Airway Pressure Ventilation , former smoker,    Pulmonary exam normal        Cardiovascular hypertension, Normal cardiovascular exam     Neuro/Psych PSYCHIATRIC DISORDERS Anxiety Depression  Neuromuscular disease (Hx of Acute Disseminated Encephalomyelitis)    GI/Hepatic Neg liver ROS, GERD  ,  Endo/Other  diabetes, Well Controlled, Oral Hypoglycemic AgentsMorbid obesity  Renal/GU negative Renal ROS  negative genitourinary   Musculoskeletal negative musculoskeletal ROS (+)   Abdominal   Peds  Hematology negative hematology ROS (+)   Anesthesia Other Findings   Reproductive/Obstetrics                           Anesthesia Physical Anesthesia Plan  ASA: III  Anesthesia Plan: General   Post-op Pain Management:    Induction: Intravenous  PONV Risk Score and Plan: 2 and Ondansetron, Dexamethasone and Treatment may vary due to age or medical condition  Airway Management Planned: Oral ETT and Video Laryngoscope Planned  Additional Equipment: None  Intra-op Plan:   Post-operative Plan: Extubation in OR  Informed Consent: I have reviewed the patients History and Physical, chart, labs and discussed the procedure including the risks, benefits and alternatives for the proposed anesthesia with the patient or authorized representative who has indicated his/her understanding and acceptance.     Plan Discussed with:   Anesthesia Plan Comments: (PAT note written  05/16/2018 by Shonna Chock, PA-C. He is for EKG on the day of surgery.  )      Anesthesia Quick Evaluation

## 2018-05-18 ENCOUNTER — Ambulatory Visit (INDEPENDENT_AMBULATORY_CARE_PROVIDER_SITE_OTHER): Payer: Medicare HMO | Admitting: Family Medicine

## 2018-05-18 VITALS — BP 131/87 | HR 92 | Temp 98.2°F | Ht 68.0 in | Wt 287.0 lb

## 2018-05-18 DIAGNOSIS — Z6841 Body Mass Index (BMI) 40.0 and over, adult: Secondary | ICD-10-CM

## 2018-05-18 DIAGNOSIS — E559 Vitamin D deficiency, unspecified: Secondary | ICD-10-CM | POA: Diagnosis not present

## 2018-05-18 MED ORDER — VANCOMYCIN HCL 10 G IV SOLR
1500.0000 mg | INTRAVENOUS | Status: AC
  Administered 2018-05-19: 1500 mg via INTRAVENOUS
  Filled 2018-05-18: qty 1500

## 2018-05-18 MED ORDER — VITAMIN D (ERGOCALCIFEROL) 1.25 MG (50000 UNIT) PO CAPS: 50000.0000 [IU] | ORAL_CAPSULE | ORAL | 0 refills | Status: DC

## 2018-05-19 ENCOUNTER — Observation Stay (HOSPITAL_COMMUNITY)
Admission: RE | Admit: 2018-05-19 | Discharge: 2018-05-21 | Disposition: A | Discharge status: Home / Self Care | Payer: 59 | Source: Ambulatory Visit | Attending: Neurosurgery | Admitting: Neurosurgery

## 2018-05-19 ENCOUNTER — Ambulatory Visit (HOSPITAL_COMMUNITY): Payer: 59 | Admitting: Vascular Surgery

## 2018-05-19 ENCOUNTER — Encounter (HOSPITAL_COMMUNITY): Payer: Self-pay | Admitting: Urology

## 2018-05-19 ENCOUNTER — Ambulatory Visit (HOSPITAL_COMMUNITY): Payer: 59 | Admitting: Physician Assistant

## 2018-05-19 ENCOUNTER — Ambulatory Visit (HOSPITAL_COMMUNITY): Payer: 59

## 2018-05-19 ENCOUNTER — Encounter (INDEPENDENT_AMBULATORY_CARE_PROVIDER_SITE_OTHER): Payer: Self-pay | Admitting: Family Medicine

## 2018-05-19 ENCOUNTER — Encounter (HOSPITAL_COMMUNITY)
Admission: RE | Disposition: A | Discharge status: Home / Self Care | Payer: Self-pay | Source: Ambulatory Visit | Attending: Neurosurgery

## 2018-05-19 DIAGNOSIS — E785 Hyperlipidemia, unspecified: Secondary | ICD-10-CM | POA: Insufficient documentation

## 2018-05-19 DIAGNOSIS — Z882 Allergy status to sulfonamides status: Secondary | ICD-10-CM | POA: Diagnosis not present

## 2018-05-19 DIAGNOSIS — Z87891 Personal history of nicotine dependence: Secondary | ICD-10-CM | POA: Diagnosis not present

## 2018-05-19 DIAGNOSIS — Z8249 Family history of ischemic heart disease and other diseases of the circulatory system: Secondary | ICD-10-CM | POA: Insufficient documentation

## 2018-05-19 DIAGNOSIS — M50123 Cervical disc disorder at C6-C7 level with radiculopathy: Secondary | ICD-10-CM | POA: Diagnosis not present

## 2018-05-19 DIAGNOSIS — M5020 Other cervical disc displacement, unspecified cervical region: Secondary | ICD-10-CM | POA: Diagnosis present

## 2018-05-19 DIAGNOSIS — Z79899 Other long term (current) drug therapy: Secondary | ICD-10-CM | POA: Diagnosis not present

## 2018-05-19 DIAGNOSIS — G35 Multiple sclerosis: Secondary | ICD-10-CM | POA: Insufficient documentation

## 2018-05-19 DIAGNOSIS — I10 Essential (primary) hypertension: Secondary | ICD-10-CM | POA: Insufficient documentation

## 2018-05-19 DIAGNOSIS — Z8673 Personal history of transient ischemic attack (TIA), and cerebral infarction without residual deficits: Secondary | ICD-10-CM | POA: Diagnosis not present

## 2018-05-19 DIAGNOSIS — Z880 Allergy status to penicillin: Secondary | ICD-10-CM | POA: Diagnosis not present

## 2018-05-19 DIAGNOSIS — Z7984 Long term (current) use of oral hypoglycemic drugs: Secondary | ICD-10-CM | POA: Diagnosis not present

## 2018-05-19 DIAGNOSIS — Z419 Encounter for procedure for purposes other than remedying health state, unspecified: Secondary | ICD-10-CM

## 2018-05-19 DIAGNOSIS — E119 Type 2 diabetes mellitus without complications: Secondary | ICD-10-CM | POA: Insufficient documentation

## 2018-05-19 DIAGNOSIS — M542 Cervicalgia: Secondary | ICD-10-CM | POA: Diagnosis not present

## 2018-05-19 DIAGNOSIS — G4730 Sleep apnea, unspecified: Secondary | ICD-10-CM | POA: Diagnosis not present

## 2018-05-19 HISTORY — PX: ANTERIOR CERVICAL DECOMP/DISCECTOMY FUSION: SHX1161

## 2018-05-19 LAB — GLUCOSE, CAPILLARY
Glucose-Capillary: 82 mg/dL (ref 70–99)
Glucose-Capillary: 98 mg/dL (ref 70–99)
Glucose-Capillary: 145 mg/dL — ABNORMAL HIGH (ref 70–99)

## 2018-05-19 LAB — HEMOGLOBIN A1C
Hgb A1c MFr Bld: 4.9 % (ref 4.8–5.6)
Mean Plasma Glucose: 93.93 mg/dL

## 2018-05-19 SURGERY — ANTERIOR CERVICAL DECOMPRESSION/DISCECTOMY FUSION 1 LEVEL/HARDWARE REMOVAL
Anesthesia: General | Site: Neck

## 2018-05-19 MED ORDER — CYCLOBENZAPRINE HCL 10 MG PO TABS
ORAL_TABLET | ORAL | Status: AC
  Filled 2018-05-19: qty 1

## 2018-05-19 MED ORDER — ACETAMINOPHEN 325 MG PO TABS: 650.0000 mg | ORAL_TABLET | ORAL | Status: DC | PRN

## 2018-05-19 MED ORDER — SODIUM CHLORIDE 0.9 % IV SOLN
INTRAVENOUS | Status: DC | PRN
  Administered 2018-05-19: 20 ug/min via INTRAVENOUS

## 2018-05-19 MED ORDER — ONDANSETRON HCL 4 MG/2ML IJ SOLN
INTRAMUSCULAR | Status: AC
  Filled 2018-05-19: qty 2

## 2018-05-19 MED ORDER — DEXAMETHASONE SODIUM PHOSPHATE 10 MG/ML IJ SOLN
INTRAMUSCULAR | Status: DC | PRN
  Administered 2018-05-19: 10 mg via INTRAVENOUS

## 2018-05-19 MED ORDER — OXYCODONE HCL 5 MG/5ML PO SOLN: 5.0000 mg | Freq: Once | ORAL | Status: DC | PRN

## 2018-05-19 MED ORDER — FENTANYL CITRATE (PF) 100 MCG/2ML IJ SOLN
INTRAMUSCULAR | Status: AC
  Filled 2018-05-19: qty 2

## 2018-05-19 MED ORDER — MIDAZOLAM HCL 5 MG/5ML IJ SOLN
INTRAMUSCULAR | Status: DC | PRN
  Administered 2018-05-19: 2 mg via INTRAVENOUS

## 2018-05-19 MED ORDER — THROMBIN 5000 UNITS EX SOLR
CUTANEOUS | Status: AC
  Filled 2018-05-19: qty 5000

## 2018-05-19 MED ORDER — LACTATED RINGERS IV SOLN: INTRAVENOUS | Status: DC

## 2018-05-19 MED ORDER — LACTATED RINGERS IV SOLN
INTRAVENOUS | Status: DC
  Administered 2018-05-19 (×3): via INTRAVENOUS

## 2018-05-19 MED ORDER — ACETAMINOPHEN 650 MG RE SUPP: 650.0000 mg | RECTAL | Status: DC | PRN

## 2018-05-19 MED ORDER — HYDROCHLOROTHIAZIDE 12.5 MG PO CAPS
12.5000 mg | ORAL_CAPSULE | Freq: Every day | ORAL | Status: DC
  Administered 2018-05-20: 12.5 mg via ORAL
  Filled 2018-05-19 (×2): qty 1

## 2018-05-19 MED ORDER — LISINOPRIL-HYDROCHLOROTHIAZIDE 10-12.5 MG PO TABS: 1.0000 | ORAL_TABLET | Freq: Every day | ORAL | Status: DC

## 2018-05-19 MED ORDER — PROPOFOL 10 MG/ML IV BOLUS
INTRAVENOUS | Status: DC | PRN
  Administered 2018-05-19: 200 mg via INTRAVENOUS

## 2018-05-19 MED ORDER — FENTANYL CITRATE (PF) 250 MCG/5ML IJ SOLN
INTRAMUSCULAR | Status: AC
  Filled 2018-05-19: qty 5

## 2018-05-19 MED ORDER — PHENYLEPHRINE 40 MCG/ML (10ML) SYRINGE FOR IV PUSH (FOR BLOOD PRESSURE SUPPORT)
PREFILLED_SYRINGE | INTRAVENOUS | Status: AC
  Filled 2018-05-19: qty 10

## 2018-05-19 MED ORDER — THROMBIN 5000 UNITS EX SOLR
OROMUCOSAL | Status: DC | PRN
  Administered 2018-05-19: 16:00:00 via TOPICAL

## 2018-05-19 MED ORDER — SUGAMMADEX SODIUM 500 MG/5ML IV SOLN
INTRAVENOUS | Status: AC
  Filled 2018-05-19: qty 5

## 2018-05-19 MED ORDER — METFORMIN HCL ER 500 MG PO TB24
1000.0000 mg | ORAL_TABLET | Freq: Two times a day (BID) | ORAL | Status: DC
  Administered 2018-05-20 – 2018-05-21 (×3): 1000 mg via ORAL
  Filled 2018-05-19 (×4): qty 2

## 2018-05-19 MED ORDER — MORPHINE SULFATE (PF) 4 MG/ML IV SOLN
4.0000 mg | INTRAVENOUS | Status: DC | PRN
  Administered 2018-05-19: 4 mg via INTRAVENOUS
  Filled 2018-05-19: qty 1

## 2018-05-19 MED ORDER — PROPOFOL 10 MG/ML IV BOLUS
INTRAVENOUS | Status: AC
  Filled 2018-05-19: qty 20

## 2018-05-19 MED ORDER — DOCUSATE SODIUM 100 MG PO CAPS
200.0000 mg | ORAL_CAPSULE | Freq: Two times a day (BID) | ORAL | Status: DC
  Administered 2018-05-19 – 2018-05-20 (×3): 200 mg via ORAL
  Filled 2018-05-19 (×3): qty 2

## 2018-05-19 MED ORDER — LIDOCAINE 2% (20 MG/ML) 5 ML SYRINGE
INTRAMUSCULAR | Status: DC | PRN
  Administered 2018-05-19: 50 mg via INTRAVENOUS

## 2018-05-19 MED ORDER — OXYCODONE-ACETAMINOPHEN 10-325 MG PO TABS: 1.0000 | ORAL_TABLET | ORAL | Status: DC | PRN

## 2018-05-19 MED ORDER — INSULIN ASPART 100 UNIT/ML ~~LOC~~ SOLN: 0.0000 [IU] | SUBCUTANEOUS | Status: DC

## 2018-05-19 MED ORDER — LIRAGLUTIDE 18 MG/3ML ~~LOC~~ SOPN: 0.6000 mg | PEN_INJECTOR | SUBCUTANEOUS | Status: DC

## 2018-05-19 MED ORDER — OXYCODONE HCL 5 MG PO TABS: 5.0000 mg | ORAL_TABLET | Freq: Once | ORAL | Status: DC | PRN

## 2018-05-19 MED ORDER — CHLORHEXIDINE GLUCONATE CLOTH 2 % EX PADS: 6.0000 | MEDICATED_PAD | Freq: Once | CUTANEOUS | Status: DC

## 2018-05-19 MED ORDER — PANTOPRAZOLE SODIUM 40 MG IV SOLR
40.0000 mg | Freq: Every day | INTRAVENOUS | Status: DC
  Administered 2018-05-19: 40 mg via INTRAVENOUS
  Filled 2018-05-19: qty 40

## 2018-05-19 MED ORDER — KETAMINE HCL 50 MG/5ML IJ SOSY
PREFILLED_SYRINGE | INTRAMUSCULAR | Status: AC
  Filled 2018-05-19: qty 10

## 2018-05-19 MED ORDER — BUPIVACAINE-EPINEPHRINE 0.5% -1:200000 IJ SOLN
INTRAMUSCULAR | Status: DC | PRN
  Administered 2018-05-19: 10 mL

## 2018-05-19 MED ORDER — FENTANYL CITRATE (PF) 100 MCG/2ML IJ SOLN
INTRAMUSCULAR | Status: DC | PRN
  Administered 2018-05-19 (×3): 50 ug via INTRAVENOUS
  Administered 2018-05-19: 100 ug via INTRAVENOUS

## 2018-05-19 MED ORDER — PHENYLEPHRINE HCL 10 MG/ML IJ SOLN
INTRAMUSCULAR | Status: DC | PRN
  Administered 2018-05-19: 80 ug via INTRAVENOUS

## 2018-05-19 MED ORDER — DEXAMETHASONE 4 MG PO TABS
2.0000 mg | ORAL_TABLET | Freq: Four times a day (QID) | ORAL | Status: AC
  Administered 2018-05-20: 2 mg via ORAL
  Filled 2018-05-19: qty 1

## 2018-05-19 MED ORDER — ONDANSETRON HCL 4 MG PO TABS: 4.0000 mg | ORAL_TABLET | Freq: Four times a day (QID) | ORAL | Status: DC | PRN

## 2018-05-19 MED ORDER — BUPIVACAINE-EPINEPHRINE 0.5% -1:200000 IJ SOLN
INTRAMUSCULAR | Status: AC
  Filled 2018-05-19: qty 1

## 2018-05-19 MED ORDER — OXYCODONE HCL 5 MG PO TABS
10.0000 mg | ORAL_TABLET | ORAL | Status: DC | PRN
  Administered 2018-05-19 – 2018-05-21 (×10): 10 mg via ORAL
  Filled 2018-05-19 (×9): qty 2

## 2018-05-19 MED ORDER — ONDANSETRON HCL 4 MG/2ML IJ SOLN
INTRAMUSCULAR | Status: DC | PRN
  Administered 2018-05-19: 4 mg via INTRAVENOUS

## 2018-05-19 MED ORDER — DEXAMETHASONE SODIUM PHOSPHATE 4 MG/ML IJ SOLN
2.0000 mg | Freq: Four times a day (QID) | INTRAMUSCULAR | Status: AC
  Administered 2018-05-19: 2 mg via INTRAVENOUS
  Filled 2018-05-19: qty 1

## 2018-05-19 MED ORDER — MENTHOL 3 MG MT LOZG: 1.0000 | LOZENGE | OROMUCOSAL | Status: DC | PRN

## 2018-05-19 MED ORDER — FENTANYL CITRATE (PF) 100 MCG/2ML IJ SOLN: 25.0000 ug | INTRAMUSCULAR | Status: DC | PRN

## 2018-05-19 MED ORDER — POLYETHYLENE GLYCOL 3350 17 G PO PACK
17.0000 g | PACK | Freq: Every day | ORAL | Status: DC
  Administered 2018-05-20 (×2): 17 g via ORAL
  Filled 2018-05-19 (×2): qty 1

## 2018-05-19 MED ORDER — LISINOPRIL 10 MG PO TABS
10.0000 mg | ORAL_TABLET | Freq: Every day | ORAL | Status: DC
  Administered 2018-05-19 – 2018-05-20 (×2): 10 mg via ORAL
  Filled 2018-05-19 (×2): qty 1

## 2018-05-19 MED ORDER — ROCURONIUM BROMIDE 50 MG/5ML IV SOSY
PREFILLED_SYRINGE | INTRAVENOUS | Status: DC | PRN
  Administered 2018-05-19: 90 mg via INTRAVENOUS
  Administered 2018-05-19 (×2): 25 mg via INTRAVENOUS

## 2018-05-19 MED ORDER — CYCLOBENZAPRINE HCL ER 15 MG PO CP24: 30.0000 mg | ORAL_CAPSULE | Freq: Every day | ORAL | Status: DC

## 2018-05-19 MED ORDER — ONDANSETRON HCL 4 MG/2ML IJ SOLN: 4.0000 mg | Freq: Once | INTRAMUSCULAR | Status: DC | PRN

## 2018-05-19 MED ORDER — KETAMINE HCL 50 MG/ML IJ SOLN
INTRAMUSCULAR | Status: DC | PRN
  Administered 2018-05-19: 10 mg via INTRAMUSCULAR
  Administered 2018-05-19 (×2): 30 mg via INTRAMUSCULAR

## 2018-05-19 MED ORDER — ONDANSETRON HCL 4 MG/2ML IJ SOLN: 4.0000 mg | Freq: Four times a day (QID) | INTRAMUSCULAR | Status: DC | PRN

## 2018-05-19 MED ORDER — BACITRACIN ZINC 500 UNIT/GM EX OINT
TOPICAL_OINTMENT | CUTANEOUS | Status: DC | PRN
  Administered 2018-05-19: 1 via TOPICAL

## 2018-05-19 MED ORDER — ALUM & MAG HYDROXIDE-SIMETH 200-200-20 MG/5ML PO SUSP
30.0000 mL | Freq: Four times a day (QID) | ORAL | Status: DC | PRN
  Administered 2018-05-20 – 2018-05-21 (×2): 30 mL via ORAL
  Filled 2018-05-19 (×2): qty 30

## 2018-05-19 MED ORDER — ACETAMINOPHEN 500 MG PO TABS
1000.0000 mg | ORAL_TABLET | Freq: Four times a day (QID) | ORAL | Status: AC
  Administered 2018-05-19 – 2018-05-20 (×4): 1000 mg via ORAL
  Filled 2018-05-19 (×4): qty 2

## 2018-05-19 MED ORDER — GABAPENTIN 300 MG PO CAPS
900.0000 mg | ORAL_CAPSULE | Freq: Two times a day (BID) | ORAL | Status: DC
  Administered 2018-05-19 – 2018-05-20 (×3): 900 mg via ORAL
  Filled 2018-05-19 (×3): qty 3

## 2018-05-19 MED ORDER — COLESEVELAM HCL 625 MG PO TABS
1875.0000 mg | ORAL_TABLET | Freq: Every day | ORAL | Status: DC
  Administered 2018-05-20 – 2018-05-21 (×2): 1875 mg via ORAL
  Filled 2018-05-19 (×2): qty 3

## 2018-05-19 MED ORDER — DEXAMETHASONE SODIUM PHOSPHATE 10 MG/ML IJ SOLN
INTRAMUSCULAR | Status: AC
  Filled 2018-05-19: qty 2

## 2018-05-19 MED ORDER — SODIUM CHLORIDE 0.9 % IV SOLN
INTRAVENOUS | Status: DC | PRN
  Administered 2018-05-19: 16:00:00

## 2018-05-19 MED ORDER — ROCURONIUM BROMIDE 50 MG/5ML IV SOSY
PREFILLED_SYRINGE | INTRAVENOUS | Status: AC
  Filled 2018-05-19: qty 20

## 2018-05-19 MED ORDER — PHENOL 1.4 % MT LIQD: 1.0000 | OROMUCOSAL | Status: DC | PRN

## 2018-05-19 MED ORDER — BACITRACIN ZINC 500 UNIT/GM EX OINT
TOPICAL_OINTMENT | CUTANEOUS | Status: AC
  Filled 2018-05-19: qty 28.35

## 2018-05-19 MED ORDER — INSULIN ASPART 100 UNIT/ML ~~LOC~~ SOLN: 0.0000 [IU] | Freq: Three times a day (TID) | SUBCUTANEOUS | Status: DC

## 2018-05-19 MED ORDER — ATORVASTATIN CALCIUM 20 MG PO TABS
20.0000 mg | ORAL_TABLET | Freq: Every day | ORAL | Status: DC
  Administered 2018-05-19 – 2018-05-20 (×2): 20 mg via ORAL
  Filled 2018-05-19 (×2): qty 1

## 2018-05-19 MED ORDER — LIDOCAINE 2% (20 MG/ML) 5 ML SYRINGE
INTRAMUSCULAR | Status: AC
  Filled 2018-05-19: qty 10

## 2018-05-19 MED ORDER — BISACODYL 10 MG RE SUPP: 10.0000 mg | Freq: Every day | RECTAL | Status: DC | PRN

## 2018-05-19 MED ORDER — 0.9 % SODIUM CHLORIDE (POUR BTL) OPTIME
TOPICAL | Status: DC | PRN
  Administered 2018-05-19: 1000 mL

## 2018-05-19 MED ORDER — MIDAZOLAM HCL 2 MG/2ML IJ SOLN
INTRAMUSCULAR | Status: AC
  Filled 2018-05-19: qty 2

## 2018-05-19 MED ORDER — OXYCODONE HCL 5 MG PO TABS: 5.0000 mg | ORAL_TABLET | ORAL | Status: DC | PRN

## 2018-05-19 MED ORDER — OXYCODONE HCL 5 MG PO TABS
ORAL_TABLET | ORAL | Status: AC
  Filled 2018-05-19: qty 2

## 2018-05-19 MED ORDER — VANCOMYCIN HCL 10 G IV SOLR
1500.0000 mg | Freq: Once | INTRAVENOUS | Status: AC
  Administered 2018-05-19: 1500 mg via INTRAVENOUS
  Filled 2018-05-19: qty 1500

## 2018-05-19 MED ORDER — CYCLOBENZAPRINE HCL 10 MG PO TABS
10.0000 mg | ORAL_TABLET | Freq: Three times a day (TID) | ORAL | Status: DC | PRN
  Administered 2018-05-19 – 2018-05-20 (×2): 10 mg via ORAL
  Filled 2018-05-19: qty 1

## 2018-05-19 MED ORDER — SUGAMMADEX SODIUM 500 MG/5ML IV SOLN
INTRAVENOUS | Status: DC | PRN
  Administered 2018-05-19: 300 mg via INTRAVENOUS

## 2018-05-19 SURGICAL SUPPLY — 66 items
APL SKNCLS STERI-STRIP NONHPOA (GAUZE/BANDAGES/DRESSINGS) ×1
BAG DECANTER FOR FLEXI CONT (MISCELLANEOUS) ×2 IMPLANT
BENZOIN TINCTURE PRP APPL 2/3 (GAUZE/BANDAGES/DRESSINGS) ×3 IMPLANT
BIT DRILL NEURO 2X3.1 SFT TUCH (MISCELLANEOUS) ×1 IMPLANT
BLADE SURG 15 STRL LF DISP TIS (BLADE) ×1 IMPLANT
BLADE SURG 15 STRL SS (BLADE)
BLADE ULTRA TIP 2M (BLADE) ×2 IMPLANT
BUR BARREL STRAIGHT FLUTE 4.0 (BURR) ×2 IMPLANT
BUR MATCHSTICK NEURO 3.0 LAGG (BURR) ×2 IMPLANT
CANISTER SUCT 3000ML PPV (MISCELLANEOUS) ×2 IMPLANT
CARTRIDGE OIL MAESTRO DRILL (MISCELLANEOUS) ×1 IMPLANT
COVER MAYO STAND STRL (DRAPES) ×2 IMPLANT
COVER WAND RF STERILE (DRAPES) ×1 IMPLANT
DECANTER SPIKE VIAL GLASS SM (MISCELLANEOUS) ×2 IMPLANT
DIFFUSER DRILL AIR PNEUMATIC (MISCELLANEOUS) ×2 IMPLANT
DRAPE LAPAROTOMY 100X72 PEDS (DRAPES) ×2 IMPLANT
DRAPE MICROSCOPE LEICA (MISCELLANEOUS) IMPLANT
DRAPE POUCH INSTRU U-SHP 10X18 (DRAPES) ×1 IMPLANT
DRAPE SURG 17X23 STRL (DRAPES) ×4 IMPLANT
DRILL NEURO 2X3.1 SOFT TOUCH (MISCELLANEOUS) ×2
DRSG OPSITE POSTOP 4X6 (GAUZE/BANDAGES/DRESSINGS) ×1 IMPLANT
ELECT BLADE 4.0 EZ CLEAN MEGAD (MISCELLANEOUS) ×2
ELECT REM PT RETURN 9FT ADLT (ELECTROSURGICAL) ×2
ELECTRODE BLDE 4.0 EZ CLN MEGD (MISCELLANEOUS) IMPLANT
ELECTRODE REM PT RTRN 9FT ADLT (ELECTROSURGICAL) ×1 IMPLANT
GAUZE 4X4 16PLY RFD (DISPOSABLE) IMPLANT
GAUZE SPONGE 4X4 12PLY STRL (GAUZE/BANDAGES/DRESSINGS) ×1 IMPLANT
GLOVE BIO SURGEON STRL SZ 6.5 (GLOVE) ×4 IMPLANT
GLOVE BIO SURGEON STRL SZ8 (GLOVE) ×2 IMPLANT
GLOVE BIO SURGEON STRL SZ8.5 (GLOVE) ×2 IMPLANT
GLOVE BIOGEL PI IND STRL 6.5 (GLOVE) IMPLANT
GLOVE BIOGEL PI IND STRL 7.5 (GLOVE) IMPLANT
GLOVE BIOGEL PI INDICATOR 6.5 (GLOVE) ×2
GLOVE BIOGEL PI INDICATOR 7.5 (GLOVE) ×1
GLOVE ECLIPSE 7.0 STRL STRAW (GLOVE) ×1 IMPLANT
GLOVE EXAM NITRILE XL STR (GLOVE) IMPLANT
GLOVE SURG SS PI 7.5 STRL IVOR (GLOVE) ×1 IMPLANT
GOWN STRL REUS W/ TWL LRG LVL3 (GOWN DISPOSABLE) IMPLANT
GOWN STRL REUS W/ TWL XL LVL3 (GOWN DISPOSABLE) ×1 IMPLANT
GOWN STRL REUS W/TWL LRG LVL3 (GOWN DISPOSABLE) ×6
GOWN STRL REUS W/TWL XL LVL3 (GOWN DISPOSABLE) ×2
HEMOSTAT POWDER KIT SURGIFOAM (HEMOSTASIS) ×2 IMPLANT
KIT BASIN OR (CUSTOM PROCEDURE TRAY) ×2 IMPLANT
KIT TURNOVER KIT B (KITS) ×2 IMPLANT
MARKER SKIN DUAL TIP RULER LAB (MISCELLANEOUS) ×2 IMPLANT
NDL SPNL 18GX3.5 QUINCKE PK (NEEDLE) ×1 IMPLANT
NEEDLE HYPO 22GX1.5 SAFETY (NEEDLE) ×2 IMPLANT
NEEDLE SPNL 18GX3.5 QUINCKE PK (NEEDLE) ×2 IMPLANT
NS IRRIG 1000ML POUR BTL (IV SOLUTION) ×2 IMPLANT
OIL CARTRIDGE MAESTRO DRILL (MISCELLANEOUS) ×2
PACK LAMINECTOMY NEURO (CUSTOM PROCEDURE TRAY) ×2 IMPLANT
PEEK VISTA 14X14X7MM (Peek) ×1 IMPLANT
PIN DISTRACTION 14MM (PIN) ×4 IMPLANT
PLATE ANT CERV XTEND ELD 1 L12 (Plate) ×1 IMPLANT
PUTTY DBM 2CC CALC GRAN (Putty) ×1 IMPLANT
RUBBERBAND STERILE (MISCELLANEOUS) IMPLANT
SCREW XTD VAR 4.2 SELF TAP (Screw) ×4 IMPLANT
SPONGE INTESTINAL PEANUT (DISPOSABLE) ×4 IMPLANT
SPONGE SURGIFOAM ABS GEL SZ50 (HEMOSTASIS) IMPLANT
STRIP CLOSURE SKIN 1/2X4 (GAUZE/BANDAGES/DRESSINGS) ×2 IMPLANT
SUT VIC AB 0 CT1 27 (SUTURE) ×2
SUT VIC AB 0 CT1 27XBRD ANTBC (SUTURE) ×1 IMPLANT
SUT VIC AB 3-0 SH 8-18 (SUTURE) ×2 IMPLANT
TOWEL GREEN STERILE (TOWEL DISPOSABLE) ×2 IMPLANT
TOWEL GREEN STERILE FF (TOWEL DISPOSABLE) ×2 IMPLANT
WATER STERILE IRR 1000ML POUR (IV SOLUTION) ×2 IMPLANT

## 2018-05-19 NOTE — Progress Notes (Signed)
Pharmacy Antibiotic Note  Brandon [name] is a 47 y.o. male admitted on 05/19/2018  for surgery. Pharmacy has been consulted for Vancomycin dosing for surgical prophylaxis s/p anterior cervical decompresion /discectomy fusion.  No drain.   Will give Vancomycin x1 dose post op.  Vanc preop 1500mg  IV given today at 13:17.   Plan: Vancomycin 1500mg  IV x1 at 0130 05/20/18.  Pharmacy will sign off.   Temp (24hrs), Avg:98.3 F (36.8 C), Min:97.3 F (36.3 C), Max:98.8 F (37.1 C)  Recent Labs  Lab 05/13/18 0957  WBC 5.0  CREATININE 0.99    Estimated Creatinine Clearance: 122.8 mL/min (by C-G formula based on SCr of 0.99 mg/dL).    Allergies  Allergen Reactions  . Amoxicillin Anaphylaxis    Has patient had a PCN reaction causing immediate rash, facial/tongue/throat swelling, SOB or lightheadedness with hypotension: Yes Has patient had a PCN reaction causing severe rash involving mucus membranes or skin necrosis: No Has patient had a PCN reaction that required hospitalization: No Has patient had a PCN reaction occurring within the last 10 years: No If all of the above answers are "NO", then may proceed with Cephalosporin use.   [name] Penicillin G Anaphylaxis    Gets extremely high fevers. Has patient had a PCN reaction causing immediate rash, facial/tongue/throat swelling, SOB or lightheadedness with hypotension: Yes Has patient had a PCN reaction causing severe rash involving mucus membranes or skin necrosis: No Has patient had a PCN reaction that required hospitalization: No Has patient had a PCN reaction occurring within the last 10 years: No If all of the above answers are "NO", then may proceed with Cephalosporin use.   . Sulfa Antibiotics Other (See Comments)    It will kill him  . Cephalosporins Other (See Comments)    Reaction unspecified.  . Quinolones Other (See Comments)    Reaction unspecified  . Sulfamethoxazole Swelling    Thank you for allowing pharmacy to be a part  of this patient's care. Noah Delaine, RPh Clinical Pharmacist Please check AMION for all [facility] Pharmacy phone numbers After 10:00 PM, call Main Pharmacy [phone] 05/19/2018 8:21 PM
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
## 2018-05-19 NOTE — Op Note (Signed)
Brief history: The patient is a 47 year old male on whom I performed a C4-5 and C5-6 anterior cervical discectomy, fusion and plating about a year and half ago.  He did well until recently when he developed neck and left arm pain.  He failed medical management.  He was worked up with a cervical MRI which demonstrated a herniated disc at C6-7 on the left.  I discussed the various treatment options with the patient including surgery.  He has weighed the risk, benefits, and alternatives surgery and decided proceed with a C6-7 anterior cervical discectomy, fusion and plating.  Preoperative diagnosis: C6-7 herniated disc, cervical radiculopathy, cervicalgia  Postoperative diagnosis: The same  Procedure: C6-7 anterior cervical discectomy/decompression; C6-7 interbody arthrodesis with local morcellized autograft bone and Kinnex bone graft extender; insertion of interbody prosthesis at C6-7 (Zimmer peek interbody prosthesis); anterior cervical plating from C6-7 with globus titanium plate; removal of old C6 vertebral body screws  Surgeon: Dr. Delma Officer  Asst.: Hildred Priest nurse practitioner  Anesthesia: Gen. endotracheal  Estimated blood loss: 100 cc  Drains: None  Complications: None  Description of procedure: The patient was brought to the operating room by the anesthesia team. General endotracheal anesthesia was induced. A roll was placed under the patient's shoulders to keep the neck in the neutral position. The patient's anterior cervical region was then prepared with Betadine scrub and Betadine solution. Sterile drapes were applied.  The area to be incised was then injected with Marcaine with epinephrine solution. I then used a scalpel to make a transverse incision in the patient's left anterior neck, incising through the old surgical scar. I used the Metzenbaum scissors to carefully dissected through the scar tissue and to divide the platysmal muscle and then to dissect medial to the  sternocleidomastoid muscle, jugular vein, and carotid artery. I carefully dissected down towards the anterior cervical spine identifying the esophagus and retracting it medially. Then using Kitner swabs to clear soft tissue from the anterior cervical spine and exposing the lower aspect of the old plate and screws.  I then used electrocautery to detach the medial border of the longus colli muscle bilaterally from the C6-7 intervertebral disc spaces. I then inserted the Caspar self-retaining retractor underneath the longus colli muscle bilaterally to provide exposure.  We then incised the intervertebral disc at C6-7. We then performed a partial intervertebral discectomy with a pituitary forceps and the Karlin curettes.  I removed the old screws at C6 to give [place] room to place the new screws.  I then inserted distraction screws into the vertebral bodies at C6 and C7, using the old screw hole at C6. We then distracted the interspace. We then used the high-speed drill to decorticate the vertebral endplates at C6-7, to drill away the remainder of the intervertebral disc, to drill away some posterior spondylosis, and to thin out the posterior longitudinal ligament. I then incised ligament with the arachnoid knife. We then removed the ligament with a Kerrison punches undercutting the vertebral endplates and decompressing the thecal sac. We then performed foraminotomies about the bilateral C7 nerve roots. This completed the decompression at this level.  We now turned our to attention to the interbody fusion. We used the trial spacers to determine the appropriate size for the interbody prosthesis. We then pre-filled prosthesis with a combination of local morcellized autograft bone that we obtained during decompression as well as Kinnex bone graft extender. We then inserted the prosthesis into the distracted interspace at C6-7. We then removed the distraction screws.  There was a good snug fit of the prosthesis in the  interspace.  Having completed the fusion we now turned attention to the anterior spinal instrumentation.  There was enough room to place an adjacent plate at Z6-1 without moving the old plate at W9-6.  We used the high-speed drill to drill away some anterior spondylosis at the disc spaces so that the plate lay down flat. We selected the appropriate length titanium anterior cervical plate. We laid it along the anterior aspect of the vertebral bodies from C6 and C7, adjacent to the old plate. We then drilled 12 mm holes at C6 and C7. We then secured the plate to the vertebral bodies by placing two 12 mm self-tapping screws at C6 and C7. We then obtained intraoperative radiograph.  We could not see the plate, but it good in vivo.  We therefore secured the screws the plate the locking each cam. This completed the instrumentation.  We then obtained hemostasis using bipolar electrocautery. We irrigated the wound out with bacitracin solution. We then removed the retractor. We inspected the esophagus for any damage. There was none apparent. We then reapproximated patient's platysmal muscle with interrupted 3-0 Vicryl suture. We then reapproximated the subcutaneous tissue with interrupted 3-0 Vicryl suture. The skin was reapproximated with Steri-Strips and benzoin. The wound was then covered with bacitracin ointment. A sterile dressing was applied. The drapes were removed. Patient was subsequently extubated by the anesthesia team and transported to the post anesthesia care unit in stable condition. All sponge instrument and needle counts were reportedly correct at the end of this case.

## 2018-05-19 NOTE — Anesthesia Postprocedure Evaluation (Signed)
Anesthesia Post Note  Patient: Brandon [name]  Procedure(s) Performed: ANTERIOR CERVICAL DECOMPRESSION/DISCECTOMY FUSION, INTERBODY PROSTHESIS,PLATE/SCREWS CERVICAL SIX- CERVICAL SEVEN; EXPLORE FUSION (N/A Neck)     Patient location during evaluation: PACU Anesthesia Type: General Level of consciousness: awake and alert Pain management: pain level controlled Vital Signs Assessment: post-procedure vital signs reviewed and stable Respiratory status: spontaneous breathing, nonlabored ventilation, respiratory function stable and patient connected to nasal cannula oxygen Cardiovascular status: blood pressure returned to baseline, stable and tachycardic Postop Assessment: no apparent nausea or vomiting Anesthetic complications: no    Last Vitals:  Vitals:   05/19/18 1910 05/19/18 1915  BP:  (!) 147/102  Pulse: 100 (!) 103  Resp: (!) 26 (!) 22  Temp:    SpO2: 97% 94%    Last Pain:  Vitals:   05/19/18 1829  TempSrc:   PainSc: 6                  Beryle Lathe

## 2018-05-19 NOTE — Anesthesia Procedure Notes (Signed)
Procedure Name: Intubation Date/Time: 05/19/2018 3:34 PM Performed by: Epifanio Lesches, CRNA Pre-anesthesia Checklist: Patient identified, Emergency Drugs available, Suction available and Patient being monitored Patient Re-evaluated:Patient Re-evaluated prior to induction Oxygen Delivery Method: Circle System Utilized Preoxygenation: Pre-oxygenation with 100% oxygen Induction Type: IV induction Ventilation: Mask ventilation without difficulty and Oral airway inserted - appropriate to patient size Laryngoscope Size: Glidescope and 4 Grade View: Grade I Tube type: Oral Tube size: 7.5 mm Number of attempts: 1 Airway Equipment and Method: Stylet and Oral airway Placement Confirmation: ETT inserted through vocal cords under direct vision,  positive ETCO2 and breath sounds checked- equal and bilateral Secured at: 23 cm Tube secured with: Tape Dental Injury: Teeth and Oropharynx as per pre-operative assessment

## 2018-05-19 NOTE — Transfer of Care (Signed)
Immediate Anesthesia Transfer of Care Note  Patient: Brandon [name]  Procedure(s) Performed: ANTERIOR CERVICAL DECOMPRESSION/DISCECTOMY FUSION, INTERBODY PROSTHESIS,PLATE/SCREWS CERVICAL SIX- CERVICAL SEVEN; EXPLORE FUSION (N/A Neck)  Patient Location: PACU  Anesthesia Type:General  Level of Consciousness: awake and drowsy  Airway & Oxygen Therapy: Patient Spontanous Breathing and Patient connected to nasal cannula oxygen  Post-op Assessment: Report given to RN and Post -op Vital signs reviewed and stable  Post vital signs: Reviewed and stable  Last Vitals:  Vitals Value Taken Time  BP 144/84 05/19/2018  5:56 PM  Temp    Pulse 95 05/19/2018  5:59 PM  Resp 17 05/19/2018  5:59 PM  SpO2 96 % 05/19/2018  5:59 PM  Vitals shown include unvalidated device data.  Last Pain:  Vitals:   05/19/18 1300  TempSrc:   PainSc: 2       Patients Stated Pain Goal: 3 (05/19/18 1300)  Complications: No apparent anesthesia complications

## 2018-05-19 NOTE — H&P (Signed)
Subjective: The patient is a 47 year old male who is had previous neck surgery.  Initially has done well but developed recurrent neck and left arm pain.  He failed medical management.  He was worked up with a cervical MRI which demonstrated herniated disc at C6-7.  I discussed the various treatment option with the patient including surgery.  He has weighed the risks, benefits, and alternatives surgery and decided proceed with a C6-7 anterior cervical discectomy, fusion and plating with possible removal of his old instrumentation.  Past Medical History:  Diagnosis Date  . ADEM (acute disseminated encephalomyelitis)   . Allergy   . Anxiety 2005  . Complication of anesthesia   . Constipation   . Depression 2005  . Diabetes mellitus without complication (HCC)    Type 2  . Difficult intubation    difficult intubating and exbutating "throat Clinched around tube" during an Endoscopy procedure  . Dyspnea    with exertion  . Headache(784.0)   . Hearing loss   . Hyperinsulinemia 07/04/11   "I'm on metformin cause I produce too much insulin"  . Hyperlipidemia   . Hypertension   . Mononucleosis, infectious, with hepatitis 06/2011  . MS (multiple sclerosis) (HCC)    patinet has ADEM not MS per Patient  . Nausea    occasional per history form 08/18/11  . Neuromuscular disorder (HCC)   . Pneumonia 2010  . Sleep apnea    uses CPAP at home  . Stroke (HCC) 2000   TIA  . TIA (transient ischemic attack) 2000   denies residual  . TIA (transient ischemic attack)     Past Surgical History:  Procedure Laterality Date  . ANTERIOR CERVICAL DECOMP/DISCECTOMY FUSION N/A 10/21/2016   Procedure: ANTERIOR CERVICAL DECOMPRESSION/DISCECTOMY FUSION, INTERBODY PROSTHESIS,PLATE CERVICAL FOUR- CERVICAL FIVE, CERVICAL FIVE- CERVICAL SIX;  Surgeon: Tressie Stalker, MD;  Location: [facility] OR;  Service: Neurosurgery;  Laterality: N/A;  . CHOLECYSTECTOMY  08/02/2011   Procedure: LAPAROSCOPIC CHOLECYSTECTOMY;  Surgeon:  Shelly Rubenstein, MD;  Location: [facility] OR;  Service: General;;  . COLONOSCOPY  2003  . ESOPHAGOGASTRODUODENOSCOPY  2003  . ESOPHAGOGASTRODUODENOSCOPY  07/28/2011   Procedure: ESOPHAGOGASTRODUODENOSCOPY (EGD);  Surgeon: [name], MD;  Location: [facility] ENDOSCOPY;  Service: Endoscopy;  Laterality: N/A;  . MYRINGOTOMY  1979; 1980   bilaterally  . TONSILLECTOMY AND ADENOIDECTOMY  ~ 1980    Allergies  Allergen Reactions  . Amoxicillin Anaphylaxis    Has patient had a PCN reaction causing immediate rash, facial/tongue/throat swelling, SOB or lightheadedness with hypotension: Yes Has patient had a PCN reaction causing severe rash involving mucus membranes or skin necrosis: No Has patient had a PCN reaction that required hospitalization: No Has patient had a PCN reaction occurring within the last 10 years: No If all of the above answers are "NO", then may proceed with Cephalosporin use.   [name] Penicillin G Anaphylaxis    Gets extremely high fevers. Has patient had a PCN reaction causing immediate rash, facial/tongue/throat swelling, SOB or lightheadedness with hypotension: Yes Has patient had a PCN reaction causing severe rash involving mucus membranes or skin necrosis: No Has patient had a PCN reaction that required hospitalization: No Has patient had a PCN reaction occurring within the last 10 years: No If all of the above answers are "NO", then may proceed with Cephalosporin use.   . Sulfa Antibiotics Other (See Comments)    It will kill him  . Cephalosporins Other (See Comments)    Reaction unspecified.  . Quinolones Other (See Comments)  Reaction unspecified  . Sulfamethoxazole Swelling    Social History   Tobacco Use  . Smoking status: Former Smoker    Packs/day: 3.00    Years: 3.00    Pack years: 9.00    Types: Cigarettes    Last attempt to quit: 08/18/1987    Years since quitting: 30.7  . Smokeless tobacco: Never Used  Substance Use Topics  . Alcohol use: Not Currently     Alcohol/week: 0.0 standard drinks    Comment: 07/04/11 "glass of wine or spirits once a month"    Family History  Problem Relation Age of Onset  . Coronary artery disease Father   . Stroke Father   . Cancer Father   . High blood pressure Father   . Alcoholism Father   . Diabetes Mother   . Obesity Mother   . Cancer Maternal Grandmother        bone  . Cancer Other        Stomach -Great-great grandmother   Prior to Admission medications   Medication Sig Start Date End Date Taking? Authorizing Provider  atorvastatin (LIPITOR) 20 MG tablet TAKE 1 TABLET (20 MG TOTAL) BY MOUTH AT BEDTIME. Patient taking differently: Take 20 mg by mouth at bedtime.  03/10/18  Yes Weber, Dema Severin, PA-C  colesevelam [facility]) 625 MG tablet Take 3 tablets (1,875 mg total) by mouth daily with breakfast. 02/28/18  Yes Weber, [name] L, PA-C  cyclobenzaprine (AMRIX) 15 MG 24 hr capsule Take 2 capsules (30 mg total) by mouth daily. 02/21/18  Yes Weber, [name] L, PA-C  diazepam (VALIUM) 5 MG tablet Take 1 tablet (5 mg total) by mouth every 8 (eight) hours as needed for anxiety. 04/05/18  Yes Sagardia, Eilleen Kempf, MD  diclofenac (VOLTAREN) 75 MG EC tablet Take 1 tablet (75 mg total) by mouth 2 (two) times daily. 08/24/17  Yes Weber, [name] L, PA-C  docusate sodium (COLACE) 100 MG capsule Take 200 mg by mouth 2 (two) times daily. Taken at 08:00 and 20:00   Yes [provider]  gabapentin (NEURONTIN) 300 MG capsule Take 3 capsules (900 mg total) by mouth 2 (two) times daily. 08/24/17  Yes Weber, [name] L, PA-C  liraglutide (VICTOZA) 18 MG/3ML SOPN Inject 0.1 mLs (0.6 mg total) into the skin every morning. Patient taking differently: Inject 0.9 mg into the skin daily.  01/27/18  Yes Beasley, Caren D, MD  lisinopril-hydrochlorothiazide (PRINZIDE,ZESTORETIC) 10-12.5 MG tablet Take 1 tablet by mouth at bedtime. 0000 08/24/17  Yes Weber, [name] L, PA-C  metFORMIN (GLUCOPHAGE-XR) 500 MG 24 hr tablet TAKE 2 TABLETS (1,000 MG TOTAL)  BY MOUTH 2 (TWO) TIMES DAILY. Patient taking differently: Take 1,000 mg by mouth 2 (two) times daily. TAKE 2 TABLETS (1,000 MG TOTAL) BY MOUTH 2 (TWO) TIMES DAILY. 03/25/18  Yes Sagardia, Eilleen Kempf, MD  oxyCODONE-acetaminophen (PERCOCET) 10-325 MG tablet Take 1 tablet by mouth every 8 (eight) hours as needed for pain. Patient taking differently: Take 1 tablet by mouth every 4 (four) hours as needed for pain.  04/05/18  Yes Sagardia, Eilleen Kempf, MD  polyethylene glycol [facility] / GLYCOLAX) packet Take 17 g by mouth daily. Taken at 24:00   Yes [provider]  Vitamin D, Ergocalciferol, (DRISDOL) 1.25 MG (50000 UT) CAPS capsule Take 1 capsule (50,000 Units total) by mouth every 7 (seven) days. 05/18/18  Yes Quillian Quince D, MD  Insulin Pen Needle (BD PEN NEEDLE NANO U/F) 32G X 4 MM MISC 1 Package by Does not apply  route 2 (two) times daily. 01/27/18   Quillian Quince D, MD  NON FORMULARY CPAP machine at bedtime & sleep    [provider]     Review of Systems  Positive ROS: As above  All other systems have been reviewed and were otherwise negative with the exception of those mentioned in the HPI and as above.  Objective: Vital signs in last 24 hours: Temp:  [98.2 F (36.8 C)] 98.2 F (36.8 C) (11/14 1249) Pulse Rate:  [92] 92 (11/14 1249) Resp:  [20] 20 (11/14 1249) BP: (131-145)/(84-87) 145/84 (11/14 1249) SpO2:  [96 %] 96 % (11/14 1249) Weight:  [130.2 kg] 130.2 kg (11/13 1600) Estimated body mass index is 43.64 kg/m as calculated from the following:   Height as of 05/18/18: 5\' 8"  (1.727 m).   Weight as of 05/18/18: 130.2 kg.   General Appearance: Alert Head: Normocephalic, without obvious abnormality, atraumatic Eyes: PERRL, conjunctiva/corneas clear, EOM's intact,    Ears: Normal  Throat: Normal  Neck: Limited range of motion.  His left anterior incision is well-healed. Back: unremarkable Lungs: Clear to auscultation bilaterally, respirations  unlabored Heart: Regular rate and rhythm, no murmur, rub or gallop Abdomen: Soft, non-tender Extremities: Extremities normal, atraumatic, no cyanosis or edema Skin: unremarkable  NEUROLOGIC:   Mental status: alert and oriented,Motor Exam -the patient has weakness in his left tricep Sensory Exam -left C7 numbness. Reflexes:  Coordination - grossly normal Gait - grossly normal Balance - grossly normal Cranial Nerves: I: smell Not tested  II: visual acuity  OS: Normal  OD: Normal   II: visual fields Full to confrontation  II: pupils Equal, round, reactive to light  III,VII: ptosis None  III,IV,VI: extraocular muscles  Full ROM  V: mastication Normal  V: facial light touch sensation  Normal  V,VII: corneal reflex  Present  VII: facial muscle function - upper  Normal  VII: facial muscle function - lower Normal  VIII: hearing Not tested  IX: soft palate elevation  Normal  IX,X: gag reflex Present  XI: trapezius strength  5/5  XI: sternocleidomastoid strength 5/5  XI: neck flexion strength  5/5  XII: tongue strength  Normal    Data Review Lab Results  Component Value Date   WBC 5.0 05/13/2018   HGB 15.3 05/13/2018   HCT 47.0 05/13/2018   MCV 82.3 05/13/2018   PLT 161 05/13/2018   Lab Results  Component Value Date   NA 139 05/13/2018   K 3.2 (L) 05/13/2018   CL 104 05/13/2018   CO2 28 05/13/2018   BUN 20 05/13/2018   CREATININE 0.99 05/13/2018   GLUCOSE 83 05/13/2018   Lab Results  Component Value Date   INR 1.06 06/11/2009    Assessment/Plan: C6-7 herniated disc, cervicalgia, cervical radiculopathy: I have discussed the situation with the patient.  I have reviewed his MRI scan with him and pointed out the abnormalities.  We have discussed the various treatment options including surgery.  I described the surgical treatment option of a C6-7 anterior cervical discectomy, fusion and plating with possible exploration of his old fusion and removal of old hardware.  I  described the surgery to him.  I have shown him surgical models.  I given him a surgical pamphlet.  We have discussed the risks, benefits, alternatives, expected postoperative course, and likelihood of achieving our goals with surgery.  I have answered all his questions.  He has decided to proceed with surgery.  Cristi Loron 05/19/2018 3:02 PM

## 2018-05-20 ENCOUNTER — Other Ambulatory Visit: Payer: Self-pay

## 2018-05-20 ENCOUNTER — Ambulatory Visit: Payer: 59 | Admitting: Physician Assistant

## 2018-05-20 DIAGNOSIS — M50123 Cervical disc disorder at C6-C7 level with radiculopathy: Secondary | ICD-10-CM | POA: Diagnosis not present

## 2018-05-20 LAB — GLUCOSE, CAPILLARY
Glucose-Capillary: 120 mg/dL — ABNORMAL HIGH (ref 70–99)
Glucose-Capillary: 125 mg/dL — ABNORMAL HIGH (ref 70–99)
Glucose-Capillary: 116 mg/dL — ABNORMAL HIGH (ref 70–99)
Glucose-Capillary: 116 mg/dL — ABNORMAL HIGH (ref 70–99)

## 2018-05-20 MED ORDER — PANTOPRAZOLE SODIUM 40 MG PO TBEC
40.0000 mg | DELAYED_RELEASE_TABLET | Freq: Every day | ORAL | Status: DC
  Administered 2018-05-20: 40 mg via ORAL
  Filled 2018-05-20: qty 1

## 2018-05-20 NOTE — Plan of Care (Signed)
  Problem: Pain Management: Goal: Pain level will decrease Outcome: Progressing   Problem: Coping: Goal: Level of anxiety will decrease Outcome: Progressing   Problem: Elimination: Goal: Will not experience complications related to bowel motility Outcome: Progressing Goal: Will not experience complications related to urinary retention Outcome: Completed/Met   Problem: Pain Managment: Goal: General experience of comfort will improve Outcome: Progressing

## 2018-05-20 NOTE — Progress Notes (Signed)
Set up pt home cpap and adjusted mask to fit on around C-collar, added H2O to water chamber. Pt stated that he wanted to walk due to gas discomfort. CPAP is in pts reach and pt able to put on self with little to no help. RN informed and advised RN to call if she or pt needed any further assistance.

## 2018-05-20 NOTE — Evaluation (Addendum)
Occupational Therapy Evaluation and Discharge Patient Details Name: Brandon [name] MRN: [id number] DOB: 11-27-1970 Today's Date: 05/20/2018    History of Present Illness C6-7 anterior cervical discectomy/decompression and fusion. PHMx: C4-5 and C5-6 anterior cervical discectomy, fusion and plating about a year and half ago.   Clinical Impression   This 47 yo male admitted and underwent above presents to acute OT with all education completed and pt without further concerns about basic ADLs and will have close to 24 hour care at home. We will D/C from acute OT.     Follow Up Recommendations  No OT follow up;Supervision - Intermittent    Equipment Recommendations  None recommended by OT       Precautions / Restrictions Precautions Precautions: Cervical Precaution Booklet Issued: Yes (comment) Required Braces or Orthoses: Cervical Brace Cervical Brace: Hard collar;For comfort(definitely have it on when up and about--per Dr. Lovell Sheehan who came in room while I was in there) Restrictions Weight Bearing Restrictions: No      Mobility Bed Mobility               General bed mobility comments: Sitting EOB when I entered. Pt has adjustable bed at home. Reports last time he slept on couch when he went home, due to could not get comfortable in bed  Transfers Overall transfer level: Modified independent Equipment used: Straight cane              Pt ambulated up and down hallway with SPC Mod I        ADL either performed or assessed with clinical judgement   ADL                                         General ADL Comments: Educated on use on 2 cups for brushing teeth (one for spit and one for rinse with straw), changing out of pads for collar, washing collar pads, wearing button up shirts for ease, wearing elastic waist pants for ease, when to wear collar, how to protect pads of collar if he is going to wear if for eating and oral care, pt can bring his legs  up on the bed by turning sideways to get to his socks and shoes     Vision Patient Visual Report: No change from baseline              Pertinent Vitals/Pain Pain Assessment: 0-10 Pain Score: 5  Pain Location: stomach Pain Descriptors / Indicators: Grimacing Pain Intervention(s): Limited activity within patient's tolerance;Repositioned     Hand Dominance Right   Extremity/Trunk Assessment Upper Extremity Assessment Upper Extremity Assessment: Overall WFL for tasks assessed           Communication Communication Communication: No difficulties   Cognition Arousal/Alertness: Awake/alert Behavior During Therapy: WFL for tasks assessed/performed Overall Cognitive Status: Within Functional Limits for tasks assessed                                                Home Living Family/patient expects to be discharged to:: Private residence Living Arrangements: Non-relatives/Friends Available Help at Discharge: Friend(s) Type of Home: Apartment Home Access: Level entry     Home Layout: One level     Bathroom Shower/Tub: Chief Strategy Officer:  Standard     Home Equipment: Hand held shower head          Prior Functioning/Environment Level of Independence: Independent                 OT Problem List: Pain         OT Goals(Current goals can be found in the care plan section) Acute Rehab OT Goals Patient Stated Goal: home in 1-2 more days  OT Frequency:                AM-PAC PT "6 Clicks" Daily Activity     Outcome Measure Help from another person eating meals?: None Help from another person taking care of personal grooming?: None Help from another person toileting, which includes using toliet, bedpan, or urinal?: None Help from another person bathing (including washing, rinsing, drying)?: None Help from another person to put on and taking off regular upper body clothing?: None Help from another person to put on and  taking off regular lower body clothing?: None 6 Click Score: 24   End of Session Equipment Utilized During Treatment: Cervical collar(SPC) Nurse Communication: (No further OT needs)  Activity Tolerance: Patient tolerated treatment well Patient left: in chair;with call bell/phone within reach  OT Visit Diagnosis: Pain Pain - part of body: (stomach)                Time: 1610-9604 OT Time Calculation (min): 44 min Charges:  OT General Charges $OT Visit: 1 Visit OT Evaluation $OT Eval Moderate Complexity: 1 Mod OT Treatments $Self Care/Home Management : 23-37 mins  Ignacia Palma, OTR/L Acute [employer] Pager [phone] Office [phone]    Evette Georges 05/20/2018, 9:13 AM

## 2018-05-20 NOTE — Progress Notes (Signed)
Patients home CPAP at bedside. Patient stated he doesn't need assistance.

## 2018-05-20 NOTE — Progress Notes (Signed)
Subjective: The patient is alert and pleasant.  He does not feel he is ready to go home yet.  Objective: Vital signs in last 24 hours: Temp:  [97.3 F (36.3 C)-99.9 F (37.7 C)] 98.1 F (36.7 C) (11/15 0712) Pulse Rate:  [91-111] 91 (11/15 0712) Resp:  [17-45] 18 (11/15 0712) BP: (130-166)/(84-102) 145/87 (11/15 0712) SpO2:  [87 %-97 %] 97 % (11/15 0326) Estimated body mass index is 43.64 kg/m as calculated from the following:   Height as of 05/18/18: 5\' 8"  (1.727 m).   Weight as of 05/18/18: 130.2 kg.   Intake/Output from previous day: 11/14 0701 - 11/15 0700 In: 1300 [I.V.:1300] Out: 250 [Blood:250] Intake/Output this shift: No intake/output data recorded.  Physical exam patient is alert and oriented.  He is moving all 4 extremities well.  His dressing is clean and dry.  There is no hematoma or shift.  Lab Results: No results for input(s): WBC, HGB, HCT, PLT in the last 72 hours. BMET No results for input(s): NA, K, CL, CO2, GLUCOSE, BUN, CREATININE, CALCIUM in the last 72 hours.  Studies/Results: Dg Cervical Spine 1 View  Result Date: 05/19/2018 CLINICAL DATA:  ACDF C6-7 EXAM: DG CERVICAL SPINE - 1 VIEW COMPARISON:  CT cervical spine 05/15/2018 FINDINGS: Lateral imaging in the operating room of the cervical spine. Limited study due to large shoulder sent underpenetration. Adequate visualization to the C4-5 disc space. Anterior plate at W1-1. C5-6 and C6-7 not visualized. IMPRESSION: Exam is limited to the C4-5 level. Electronically Signed   By: Marlan Palau M.D.   On: 05/19/2018 19:33    Assessment/Plan: Postop day 1: The patient seems to doing well.  We will continue to mobilize him.  He will likely go home tomorrow.  I gave him his discharge instructions and answered all his questions.  LOS: 0 days     Cristi Loron 05/20/2018, 7:41 AM

## 2018-05-21 DIAGNOSIS — M50123 Cervical disc disorder at C6-C7 level with radiculopathy: Secondary | ICD-10-CM | POA: Diagnosis not present

## 2018-05-21 LAB — GLUCOSE, CAPILLARY: Glucose-Capillary: 108 mg/dL — ABNORMAL HIGH (ref 70–99)

## 2018-05-21 MED ORDER — CYCLOBENZAPRINE HCL 10 MG PO TABS: 10.0000 mg | ORAL_TABLET | Freq: Three times a day (TID) | ORAL | 0 refills | Status: DC | PRN

## 2018-05-21 MED ORDER — OXYCODONE HCL 10 MG PO TABS: 10.0000 mg | ORAL_TABLET | ORAL | 0 refills | Status: DC | PRN

## 2018-05-21 NOTE — Progress Notes (Signed)
Patient is discharged from room 3C09 at this time. Alert and in stable condition. IV site d/c'd and instructions read to patient and family with understanding verbalized. Left unit via wheelchair with all belongings at side. 

## 2018-05-21 NOTE — Progress Notes (Signed)
Neurosurgery Service Progress Note  Subjective: No acute events overnight, feeling better today   Objective: Vitals:   05/20/18 1936 05/20/18 2310 05/21/18 0336 05/21/18 0730  BP: 126/76 137/79 136/88 128/78  Pulse: 95 91 84 80  Resp: 20 20 20 18   Temp: 98.4 F (36.9 C) 98.1 F (36.7 C) 97.6 F (36.4 C) 97.7 F (36.5 C)  TempSrc: Oral Oral Oral Oral  SpO2: 94% 96% 96% 97%   Temp (24hrs), Avg:98 F (36.7 C), Min:97.6 F (36.4 C), Max:98.4 F (36.9 C)  CBC Latest Ref Rng & Units 05/13/2018 09/15/2017 10/12/2016  WBC 4.0 - 10.5 K/uL 5.0 6.3 6.2  Hemoglobin 13.0 - 17.0 g/dL 16.1 09.6 04.5  Hematocrit 39.0 - 52.0 % 47.0 49.5 43.5  Platelets 150 - 400 K/uL 161 - 151   BMP Latest Ref Rng & Units 05/13/2018 02/16/2018 09/15/2017  Glucose 70 - 99 mg/dL 83 84 86  BUN 6 - 20 mg/dL 20 14 13   Creatinine 0.61 - 1.24 mg/dL 4.09 8.11 9.14  BUN/Creat Ratio 9 - 20 - 13 14  Sodium 135 - 145 mmol/L 139 143 142  Potassium 3.5 - 5.1 mmol/L 3.2(L) 3.9 3.7  Chloride 98 - 111 mmol/L 104 104 100  CO2 22 - 32 mmol/L 28 24 25   Calcium 8.9 - 10.3 mg/dL 9.3 9.4 9.4   No intake or output data in the 24 hours ending 05/21/18 1041  Current Facility-Administered Medications:  .  acetaminophen (TYLENOL) tablet 650 mg, 650 mg, Oral, Q4H PRN **OR** acetaminophen (TYLENOL) suppository 650 mg, 650 mg, Rectal, Q4H PRN, Tressie Stalker, MD .  alum & mag hydroxide-simeth (MAALOX/MYLANTA) 200-200-20 MG/5ML suspension 30 mL, 30 mL, Oral, Q6H PRN, Tressie Stalker, MD, 30 mL at 05/21/18 7829 .  atorvastatin (LIPITOR) tablet 20 mg, 20 mg, Oral, QHS, Tressie Stalker, MD, 20 mg at 05/20/18 2137 .  bisacodyl (DULCOLAX) suppository 10 mg, 10 mg, Rectal, Daily PRN, Tressie Stalker, MD .  colesevelam [facility]) tablet 1,875 mg, 1,875 mg, Oral, Q breakfast, Tressie Stalker, MD, 1,875 mg at 05/21/18 5621 .  cyclobenzaprine (AMRIX) 24 hr capsule 30 mg, 30 mg, Oral, Daily, Tressie Stalker, MD .  cyclobenzaprine (FLEXERIL)  tablet 10 mg, 10 mg, Oral, TID PRN, Tressie Stalker, MD, 10 mg at 05/20/18 [phone] .  docusate sodium (COLACE) capsule 200 mg, 200 mg, Oral, BID, Tressie Stalker, MD, 200 mg at 05/20/18 2004 .  gabapentin (NEURONTIN) capsule 900 mg, 900 mg, Oral, BID, Tressie Stalker, MD, 900 mg at 05/20/18 2135 .  lisinopril (PRINIVIL,ZESTRIL) tablet 10 mg, 10 mg, Oral, QHS, 10 mg at 05/20/18 2137 **AND** hydrochlorothiazide (MICROZIDE) capsule 12.5 mg, 12.5 mg, Oral, QHS, Tressie Stalker, MD, 12.5 mg at 05/20/18 2137 .  insulin aspart (novoLOG) injection 0-20 Units, 0-20 Units, Subcutaneous, TID AC & HS, Tressie Stalker, MD .  lactated ringers infusion, , Intravenous, Continuous, Beryle Lathe, MD, Last Rate: 10 mL/hr at 05/19/18 1314 .  lactated ringers infusion, , Intravenous, Continuous, Tressie Stalker, MD .  liraglutide (VICTOZA) SOPN 0.6 mg, 0.6 mg, Subcutaneous, BH-q7a, Tressie Stalker, MD .  menthol-cetylpyridinium (CEPACOL) lozenge 3 mg, 1 lozenge, Oral, PRN **OR** phenol (CHLORASEPTIC) mouth spray 1 spray, 1 spray, Mouth/Throat, PRN, Tressie Stalker, MD .  metFORMIN (GLUCOPHAGE-XR) 24 hr tablet 1,000 mg, 1,000 mg, Oral, BID WC, Tressie Stalker, MD, 1,000 mg at 05/21/18 1011 .  morphine 4 MG/ML injection 4 mg, 4 mg, Intravenous, Q2H PRN, Tressie Stalker, MD, 4 mg at 05/19/18 2030 .  ondansetron (ZOFRAN) tablet 4 mg,  4 mg, Oral, Q6H PRN **OR** ondansetron (ZOFRAN) injection 4 mg, 4 mg, Intravenous, Q6H PRN, Tressie Stalker, MD .  oxyCODONE (Oxy IR/ROXICODONE) immediate release tablet 10 mg, 10 mg, Oral, Q3H PRN, Tressie Stalker, MD, 10 mg at 05/21/18 0823 .  oxyCODONE (Oxy IR/ROXICODONE) immediate release tablet 5 mg, 5 mg, Oral, Q3H PRN, Tressie Stalker, MD .  pantoprazole (PROTONIX) EC tablet 40 mg, 40 mg, Oral, Daily, Tressie Stalker, MD, 40 mg at 05/20/18 2135 .  polyethylene glycol (MIRALAX / GLYCOLAX) packet 17 g, 17 g, Oral, Daily, Tressie Stalker, MD, 17 g at 05/20/18 1041   Physical  Exam: MAEx4 without preference Incision w/ dressing c/d/i Rigid cervical collar in place  Assessment & Plan: 47 y.o. man s/p ACDF, recovering well. -discharge home this morning  Jadene Pierini  05/21/18 10:41 AM

## 2018-05-21 NOTE — Discharge Instructions (Signed)
Wound Care °Leave incision open to air. °You may shower. °Do not scrub directly on incision.  °Do not put any creams, lotions, or ointments on incision. °Activity °Walk each and every day, increasing distance each day. °No lifting greater than 5 lbs.  Avoid excessive neck motion. °No driving for 2 weeks; may ride as a passenger locally. °Wear neck brace at all times except when showering.  If provided soft collar, may wear for comfort unless otherwise instructed. °Diet °Resume your normal diet.  °Return to Work °Will be discussed at you follow up appointment. °Call Your Doctor If Any of These Occur °Redness, drainage, or swelling at the wound.  °Temperature greater than 101 degrees. °Severe pain not relieved by pain medication. °Increased difficulty swallowing. °Incision starts to come apart. °Follow Up Appt °Call today for appointment in 1-2 weeks ([phone]) or for problems.  If you have any hardware placed in your spine, you will need an x-ray before your appointment. °

## 2018-05-21 NOTE — Discharge Summary (Signed)
Discharge Summary  Date of Admission: 05/19/2018  Date of Discharge: 05/21/18  Attending Physician: Tressie Stalker, MD  Hospital Course: Patient was admitted following an uncomplicated single level ACDF. He was recovered in PACU and transferred to a RNF. His hospital course was uncomplicated and the patient was discharged home on POD2. He will follow up in clinic with Dr. Lovell Sheehan in 2 weeks.  Jadene Pierini, MD 05/21/18 10:46 AM

## 2018-05-23 ENCOUNTER — Encounter (HOSPITAL_COMMUNITY): Payer: Self-pay | Admitting: Neurosurgery

## 2018-06-15 ENCOUNTER — Encounter (INDEPENDENT_AMBULATORY_CARE_PROVIDER_SITE_OTHER): Payer: Self-pay | Admitting: Family Medicine

## 2018-06-15 ENCOUNTER — Ambulatory Visit (INDEPENDENT_AMBULATORY_CARE_PROVIDER_SITE_OTHER): Payer: 59 | Admitting: Family Medicine

## 2018-06-15 VITALS — BP 134/80 | HR 65 | Temp 97.6°F | Ht 68.0 in | Wt 295.0 lb

## 2018-06-15 DIAGNOSIS — E559 Vitamin D deficiency, unspecified: Secondary | ICD-10-CM | POA: Diagnosis not present

## 2018-06-15 DIAGNOSIS — Z6841 Body Mass Index (BMI) 40.0 and over, adult: Secondary | ICD-10-CM

## 2018-06-15 DIAGNOSIS — E119 Type 2 diabetes mellitus without complications: Secondary | ICD-10-CM

## 2018-06-15 MED ORDER — LIRAGLUTIDE 18 MG/3ML ~~LOC~~ SOPN: 0.6000 mg | PEN_INJECTOR | SUBCUTANEOUS | 0 refills | Status: DC

## 2018-06-15 MED ORDER — INSULIN PEN NEEDLE 32G X 4 MM MISC: 1.0000 | Freq: Two times a day (BID) | 0 refills | Status: DC

## 2018-06-15 MED ORDER — VITAMIN D (ERGOCALCIFEROL) 1.25 MG (50000 UNIT) PO CAPS: 50000.0000 [IU] | ORAL_CAPSULE | ORAL | 0 refills | Status: DC

## 2018-06-16 NOTE — Progress Notes (Signed)
Office: [phone]  /  Fax: [phone]   HPI:   Chief Complaint: OBESITY Brandon [name] is here to discuss his progress with his obesity treatment plan. He is on the Category 4 plan + 500 calories and is following his eating plan approximately 40 % of the time. He states he is exercising 0 minutes 0 times per week. Brandon [name] had surgery recently and is retaining fluid. He has struggled with meal planning and eating out a bit more.  His weight is 295 lb (133.8 kg) today and has had a weight gain of 8 pounds over a period of 4 weeks since his last visit. He has lost 10 lbs since starting treatment with [place].  Diabetes II Brandon [name] has a diagnosis of diabetes type II. Brandon [name] is stable on metformin 500mg  and Victoza 0.6mg . His last A1c was 4.9 on 05/19/18. He denies any nausea, vomiting, or hypoglycemic episodes. He has been working on intensive lifestyle modifications including diet, exercise, and weight loss to help control his blood glucose levels.  Vitamin D deficiency Brandon [name] has a diagnosis of vitamin D deficiency. He is currently taking vit D and is stable, but not as goal. He denies nausea, vomiting, or muscle weakness.  ALLERGIES: Allergies  Allergen Reactions  . Amoxicillin Anaphylaxis    Has patient had a PCN reaction causing immediate rash, facial/tongue/throat swelling, SOB or lightheadedness with hypotension: Yes Has patient had a PCN reaction causing severe rash involving mucus membranes or skin necrosis: No Has patient had a PCN reaction that required hospitalization: No Has patient had a PCN reaction occurring within the last 10 years: No If all of the above answers are "NO", then may proceed with Cephalosporin use.   [name] Penicillin G Anaphylaxis    Gets extremely high fevers. Has patient had a PCN reaction causing immediate rash, facial/tongue/throat swelling, SOB or lightheadedness with hypotension: Yes Has patient had a PCN reaction causing severe rash involving mucus membranes  or skin necrosis: No Has patient had a PCN reaction that required hospitalization: No Has patient had a PCN reaction occurring within the last 10 years: No If all of the above answers are "NO", then may proceed with Cephalosporin use.   . Sulfa Antibiotics Other (See Comments)    It will kill him  . Cephalosporins Other (See Comments)    Reaction unspecified.  . Quinolones Other (See Comments)    Reaction unspecified  . Sulfamethoxazole Swelling    MEDICATIONS: Current Outpatient Medications on File Prior to Visit  Medication Sig Dispense Refill  . atorvastatin (LIPITOR) 20 MG tablet TAKE 1 TABLET (20 MG TOTAL) BY MOUTH AT BEDTIME. (Patient taking differently: Take 20 mg by mouth at bedtime. ) 30 tablet 0  . colesevelam (WELCHOL) 625 MG tablet Take 3 tablets (1,875 mg total) by mouth daily with breakfast. 540 tablet 1  . cyclobenzaprine (FLEXERIL) 10 MG tablet Take 1 tablet (10 mg total) by mouth 3 (three) times daily as needed for muscle spasms. 30 tablet 0  . diazepam (VALIUM) 5 MG tablet Take 1 tablet (5 mg total) by mouth every 8 (eight) hours as needed for anxiety. (Patient taking differently: Take 5 mg by mouth at bedtime. ) 20 tablet 0  . docusate sodium (COLACE) 100 MG capsule Take 200 mg by mouth 2 (two) times daily. Taken at 08:00 and 20:00    . gabapentin (NEURONTIN) 300 MG capsule Take 3 capsules (900 mg total) by mouth 2 (two) times daily. 540 capsule 1  . lisinopril-hydrochlorothiazide (PRINZIDE,ZESTORETIC) 10-12.5 MG  tablet Take 1 tablet by mouth at bedtime. 0000 90 tablet 1  . metFORMIN (GLUCOPHAGE-XR) 500 MG 24 hr tablet TAKE 2 TABLETS (1,000 MG TOTAL) BY MOUTH 2 (TWO) TIMES DAILY. (Patient taking differently: Take 1,000 mg by mouth 2 (two) times daily. TAKE 2 TABLETS (1,000 MG TOTAL) BY MOUTH 2 (TWO) TIMES DAILY.) 360 tablet 1  . NON FORMULARY CPAP machine at bedtime & sleep    . oxyCODONE-acetaminophen (PERCOCET) 10-325 MG tablet Take 1 tablet by mouth every 6 (six) hours  as needed for pain.    . polyethylene glycol (MIRALAX / GLYCOLAX) packet Take 17 g by mouth daily. Taken at 24:00     No current facility-administered medications on file prior to visit.     PAST MEDICAL HISTORY: Past Medical History:  Diagnosis Date  . ADEM (acute disseminated encephalomyelitis)   . Allergy   . Anxiety 2005  . Complication of anesthesia   . Constipation   . Depression 2005  . Diabetes mellitus without complication (HCC)    Type 2  . Difficult intubation    difficult intubating and exbutating "throat Clinched around tube" during an Endoscopy procedure  . Dyspnea    with exertion  . Headache(784.0)   . Hearing loss   . Hyperinsulinemia 07/04/11   "I'm on metformin cause I produce too much insulin"  . Hyperlipidemia   . Hypertension   . Mononucleosis, infectious, with hepatitis 06/2011  . MS (multiple sclerosis) (HCC)    patinet has ADEM not MS per Patient  . Nausea    occasional per history form 08/18/11  . Neuromuscular disorder (HCC)   . Pneumonia 2010  . Sleep apnea    uses CPAP at home  . Stroke (HCC) 2000   TIA  . TIA (transient ischemic attack) 2000   denies residual  . TIA (transient ischemic attack)     PAST SURGICAL HISTORY: Past Surgical History:  Procedure Laterality Date  . ANTERIOR CERVICAL DECOMP/DISCECTOMY FUSION N/A 10/21/2016   Procedure: ANTERIOR CERVICAL DECOMPRESSION/DISCECTOMY FUSION, INTERBODY PROSTHESIS,PLATE CERVICAL FOUR- CERVICAL FIVE, CERVICAL FIVE- CERVICAL SIX;  Surgeon: Tressie Stalker, MD;  Location: [facility] OR;  Service: Neurosurgery;  Laterality: N/A;  . ANTERIOR CERVICAL DECOMP/DISCECTOMY FUSION N/A 05/19/2018   Procedure: ANTERIOR CERVICAL DECOMPRESSION/DISCECTOMY FUSION, INTERBODY PROSTHESIS,PLATE/SCREWS CERVICAL SIX- CERVICAL SEVEN; EXPLORE FUSION;  Surgeon: Tressie Stalker, MD;  Location: [facility] OR;  Service: Neurosurgery;  Laterality: N/A;  . CHOLECYSTECTOMY  08/02/2011   Procedure: LAPAROSCOPIC CHOLECYSTECTOMY;  Surgeon:  Shelly Rubenstein, MD;  Location: [facility] OR;  Service: General;;  . COLONOSCOPY  2003  . ESOPHAGOGASTRODUODENOSCOPY  2003  . ESOPHAGOGASTRODUODENOSCOPY  07/28/2011   Procedure: ESOPHAGOGASTRODUODENOSCOPY (EGD);  Surgeon: [name], MD;  Location: [facility] ENDOSCOPY;  Service: Endoscopy;  Laterality: N/A;  . MYRINGOTOMY  1979; 1980   bilaterally  . TONSILLECTOMY AND ADENOIDECTOMY  ~ 1980    SOCIAL HISTORY: Social History   Tobacco Use  . Smoking status: Former Smoker    Packs/day: 3.00    Years: 3.00    Pack years: 9.00    Types: Cigarettes    Last attempt to quit: 08/18/1987    Years since quitting: 30.8  . Smokeless tobacco: Never Used  Substance Use Topics  . Alcohol use: Not Currently    Alcohol/week: 0.0 standard drinks    Comment: 07/04/11 "glass of wine or spirits once a month"  . Drug use: Not Currently    Types: Marijuana    Comment: have not used in a 1.5 years  FAMILY HISTORY: Family History  Problem Relation Age of Onset  . Coronary artery disease Father   . Stroke Father   . Cancer Father   . High blood pressure Father   . Alcoholism Father   . Diabetes Mother   . Obesity Mother   . Cancer Maternal Grandmother        bone  . Cancer Other        Stomach -Great-great grandmother    ROS: Review of Systems  Constitutional: Negative for weight loss.  Gastrointestinal: Negative for nausea and vomiting.  Musculoskeletal:       Negative for muscle weakness.  Endo/Heme/Allergies:       Negative for hypoglycemia.    PHYSICAL EXAM: Blood pressure 134/80, pulse 65, temperature 97.6 F (36.4 C), temperature source Oral, height 5\' 8"  (1.727 m), weight 295 lb (133.8 kg), SpO2 98 %. Body mass index is 44.85 kg/m. Physical Exam Vitals signs reviewed.  Constitutional:      Appearance: Normal appearance. He is obese.  Cardiovascular:     Rate and Rhythm: Normal rate.  Pulmonary:     Effort: Pulmonary effort is normal.  Musculoskeletal: Normal range of motion.    Skin:    General: Skin is warm and dry.  Neurological:     Mental Status: He is alert and oriented to person, place, and time.  Psychiatric:        Mood and Affect: Mood normal.        Behavior: Behavior normal.     RECENT LABS AND TESTS: BMET    Component Value Date/Time   NA 139 05/13/2018 0957   NA 143 02/16/2018 1037   K 3.2 (L) 05/13/2018 0957   CL 104 05/13/2018 0957   CO2 28 05/13/2018 0957   GLUCOSE 83 05/13/2018 0957   BUN 20 05/13/2018 0957   BUN 14 02/16/2018 1037   CREATININE 0.99 05/13/2018 0957   CREATININE 1.02 08/24/2017 1458   CALCIUM 9.3 05/13/2018 0957   GFRNONAA >60 05/13/2018 0957   GFRNONAA >89 07/18/2015 1656   GFRAA >60 05/13/2018 0957   GFRAA >89 07/18/2015 1656   Lab Results  Component Value Date   HGBA1C 4.9 05/19/2018   HGBA1C 5.0 05/13/2018   HGBA1C 5.5 02/16/2018   HGBA1C 5.7 (H) 09/15/2017   HGBA1C 5.6 08/24/2017   Lab Results  Component Value Date   INSULIN 60.7 (H) 02/16/2018   INSULIN 110.6 (H) 09/15/2017   CBC    Component Value Date/Time   WBC 5.0 05/13/2018 0957   RBC 5.71 05/13/2018 0957   HGB 15.3 05/13/2018 0957   HGB 16.6 09/15/2017 0902   HCT 47.0 05/13/2018 0957   HCT 49.5 09/15/2017 0902   PLT 161 05/13/2018 0957   PLT 158 09/17/2016 1611   MCV 82.3 05/13/2018 0957   MCV 86 09/15/2017 0902   MCH 26.8 05/13/2018 0957   MCHC 32.6 05/13/2018 0957   RDW 13.4 05/13/2018 0957   RDW 14.7 09/15/2017 0902   LYMPHSABS 2.2 09/15/2017 0902   MONOABS 0.5 12/05/2014 1538   EOSABS 0.1 09/15/2017 0902   BASOSABS 0.0 09/15/2017 0902   Iron/TIBC/Ferritin/ %Sat No results found for: IRON, TIBC, FERRITIN, IRONPCTSAT Lipid Panel     Component Value Date/Time   CHOL 116 02/16/2018 1037   TRIG 340 (H) 02/16/2018 1037   HDL 27 (L) 02/16/2018 1037   CHOLHDL 3.4 08/24/2017 1458   VLDL 70 (H) 10/15/2015 1112   LDLCALC 21 02/16/2018 1037   LDLCALC 57 08/24/2017 1458  Hepatic Function Panel     Component Value Date/Time    PROT 6.5 05/13/2018 0957   PROT 6.7 02/16/2018 1037   ALBUMIN 4.0 05/13/2018 0957   ALBUMIN 4.4 02/16/2018 1037   AST 29 05/13/2018 0957   ALT 62 (H) 05/13/2018 0957   ALKPHOS 49 05/13/2018 0957   BILITOT 0.7 05/13/2018 0957   BILITOT 0.4 02/16/2018 1037   BILIDIR 0.1 04/11/2015 1638   IBILI 0.6 04/11/2015 1638      Component Value Date/Time   TSH 2.080 09/15/2017 0902   TSH 3.190 09/17/2016 1611   TSH 2.339 12/05/2014 1538   Results for TOAN, MORT "[name]" (MRN [id number]) as of 06/16/2018 09:27  Ref. Range 02/16/2018 10:37  Vitamin D, 25-Hydroxy Latest Ref Range: 30.0 - 100.0 ng/mL 28.6 (L)   ASSESSMENT AND PLAN: Vitamin D deficiency - Plan: Vitamin D, Ergocalciferol, (DRISDOL) 1.25 MG (50000 UT) CAPS capsule  Type 2 diabetes mellitus without complication, without long-term current use of insulin (HCC) - Plan: liraglutide (VICTOZA) 18 MG/3ML SOPN, Insulin Pen Needle (BD PEN NEEDLE NANO U/F) 32G X 4 MM MISC  Class 3 severe obesity with serious comorbidity and body mass index (BMI) of 40.0 to 44.9 in adult, unspecified obesity type (HCC)  PLAN:  Diabetes II Brandon [name] has been given extensive diabetes education by myself today including ideal fasting and post-prandial blood glucose readings, individual ideal Hgb A1c goals, and hypoglycemia prevention. We discussed the importance of good blood sugar control to decrease the likelihood of diabetic complications such as nephropathy, neuropathy, limb loss, blindness, coronary artery disease, and death. We discussed the importance of intensive lifestyle modification including diet, exercise and weight loss as the first line treatment for diabetes. Brandon [name] agrees to continue his diabetes medications with a refill on Victoza 0.6mg  qAM #1 pen and nanoneedles with no refills and will follow up at the agreed upon time in 3 to 4 weeks.  Vitamin D Deficiency Brandon [name] was informed that low vitamin D levels contributes to fatigue and are  associated with obesity, breast, and colon cancer. He agrees to continue to take prescription Vit D @50 ,000 IU every week #4 with no refills and will follow up for routine testing of vitamin D, at least 2-3 times per year. He was informed of the risk of over-replacement of vitamin D and agrees to not increase his dose unless he discusses this with [place] first. Brandon [name] agrees to follow up as directed.  Obesity Brandon [name] is currently in the action stage of change. As such, his goal is to continue with weight loss efforts. He has agreed to follow the Category 4 plan + 500 calories. Brandon [name] has been instructed to work up to a goal of 150 minutes of combined cardio and strengthening exercise per week for weight loss and overall health benefits. We discussed the following Behavioral Modification Strategies today: increasing lean protein intake, decreasing simple carbohydrates, decrease eating out, work on meal planning and easy cooking plans, holiday eating strategies, and celebration eating strategies.  Brandon [name] has agreed to follow up with our clinic in 3 to 4 weeks. He was informed of the importance of frequent follow up visits to maximize his success with intensive lifestyle modifications for his multiple health conditions.   OBESITY BEHAVIORAL INTERVENTION VISIT  Today's visit was # 13   Starting weight: 305 lbs Starting date: 09/15/17 Today's weight : Weight: 295 lb (133.8 kg)  Today's date: 06/15/2018 Total lbs lost to date: 10 At least 15 minutes were spent on discussing the  following behavioral intervention visit.  ASK: We discussed the diagnosis of obesity with Brandon [name] today and Brandon [name] agreed to give [place] permission to discuss obesity behavioral modification therapy today.  ASSESS: Brandon [name] has the diagnosis of obesity and his BMI today is 44.86. Brandon [name] is in the action stage of change.   ADVISE: Brandon [name] was educated on the multiple health risks of obesity as well as the benefit of  weight loss to improve his health. He was advised of the need for long term treatment and the importance of lifestyle modifications to improve his current health and to decrease his risk of future health problems.  AGREE: Multiple dietary modification options and treatment options were discussed and Brandon [name] agreed to follow the recommendations documented in the above note.  ARRANGE: Brandon [name] was educated on the importance of frequent visits to treat obesity as outlined per CMS and USPSTF guidelines and agreed to schedule his next follow up appointment today.  I, Kirke Corin, am acting as transcriptionist for Wilder Glade, MD  I have reviewed the above documentation for accuracy and completeness, and I agree with the above. -Quillian Quince, MD

## 2018-06-23 ENCOUNTER — Encounter (INDEPENDENT_AMBULATORY_CARE_PROVIDER_SITE_OTHER): Payer: Self-pay | Admitting: Family Medicine

## 2018-07-13 ENCOUNTER — Encounter (INDEPENDENT_AMBULATORY_CARE_PROVIDER_SITE_OTHER): Payer: Self-pay | Admitting: Family Medicine

## 2018-07-13 ENCOUNTER — Ambulatory Visit (INDEPENDENT_AMBULATORY_CARE_PROVIDER_SITE_OTHER): Payer: 59 | Admitting: Family Medicine

## 2018-07-13 VITALS — BP 118/71 | HR 77 | Temp 98.1°F | Ht 68.0 in | Wt 285.0 lb

## 2018-07-13 DIAGNOSIS — Z6841 Body Mass Index (BMI) 40.0 and over, adult: Secondary | ICD-10-CM

## 2018-07-13 DIAGNOSIS — E559 Vitamin D deficiency, unspecified: Secondary | ICD-10-CM

## 2018-07-14 MED ORDER — VITAMIN D (ERGOCALCIFEROL) 1.25 MG (50000 UNIT) PO CAPS: 50000.0000 [IU] | ORAL_CAPSULE | ORAL | 0 refills | Status: DC

## 2018-07-14 NOTE — Progress Notes (Signed)
Office: [phone]  /  Fax: [phone]   HPI:   Chief Complaint: OBESITY Brandon [name] is here to discuss his progress with his obesity treatment plan. He is on the Category 4 plan + 500 calories and is following his eating plan approximately 40 % of the time. He states he is moving and cleaning 60 minutes per week. [name] was retaining fluid at his last visit, but this has resolved now. He is ready to start back to his eating plan more strictly.  His weight is 285 lb (129.3 kg) today and has had a weight loss of 10 pounds over a period of 4 weeks since his last visit. He has lost 20 lbs since starting treatment with [place].  Vitamin D deficiency Brandon [name] has a diagnosis of vitamin D deficiency. He is currently stable on vit D and denies nausea, vomiting, or muscle weakness.  ASSESSMENT AND PLAN:  Vitamin D deficiency - Plan: Vitamin D, Ergocalciferol, (DRISDOL) 1.25 MG (50000 UT) CAPS capsule  Class 3 severe obesity with serious comorbidity and body mass index (BMI) of 40.0 to 44.9 in adult, unspecified obesity type (HCC)  PLAN:  Vitamin D Deficiency Brandon [name] was informed that low vitamin D levels contributes to fatigue and are associated with obesity, breast, and colon cancer. He agrees to continue to take prescription Vit D @50 ,000 IU every week #4 with no refills and will follow up for routine testing of vitamin D, at least 2-3 times per year. He was informed of the risk of over-replacement of vitamin D and agrees to not increase his dose unless he discusses this with [place] first. Brandon [name] agrees to follow up in 3 to 4 weeks.  Obesity Brandon [name] is currently in the action stage of change. As such, his goal is to continue with weight loss efforts. He has agreed to change to keep a food journal with 1800 to 2300 calories and 120+ grams of protein.  Brandon [name] has been instructed to work up to a goal of 150 minutes of combined cardio and strengthening exercise per week for weight loss and overall  health benefits. We discussed the following Behavioral Modification Strategies today: increasing lean protein intake, decreasing simple carbohydrates, and work on meal planning and easy cooking plans.  Brandon [name] has agreed to follow up with our clinic in 3 to 4 weeks. He was informed of the importance of frequent follow up visits to maximize his success with intensive lifestyle modifications for his multiple health conditions.  ALLERGIES: Allergies  Allergen Reactions  . Amoxicillin Anaphylaxis    Has patient had a PCN reaction causing immediate rash, facial/tongue/throat swelling, SOB or lightheadedness with hypotension: Yes Has patient had a PCN reaction causing severe rash involving mucus membranes or skin necrosis: No Has patient had a PCN reaction that required hospitalization: No Has patient had a PCN reaction occurring within the last 10 years: No If all of the above answers are "NO", then may proceed with Cephalosporin use.   [name] Penicillin G Anaphylaxis    Gets extremely high fevers. Has patient had a PCN reaction causing immediate rash, facial/tongue/throat swelling, SOB or lightheadedness with hypotension: Yes Has patient had a PCN reaction causing severe rash involving mucus membranes or skin necrosis: No Has patient had a PCN reaction that required hospitalization: No Has patient had a PCN reaction occurring within the last 10 years: No If all of the above answers are "NO", then may proceed with Cephalosporin use.   . Sulfa Antibiotics Other (See Comments)    It  will kill him  . Cephalosporins Other (See Comments)    Reaction unspecified.  . Quinolones Other (See Comments)    Reaction unspecified  . Sulfamethoxazole Swelling    MEDICATIONS: Current Outpatient Medications on File Prior to Visit  Medication Sig Dispense Refill  . atorvastatin (LIPITOR) 20 MG tablet TAKE 1 TABLET (20 MG TOTAL) BY MOUTH AT BEDTIME. (Patient taking differently: Take 20 mg by mouth at bedtime. )  30 tablet 0  . colesevelam (WELCHOL) 625 MG tablet Take 3 tablets (1,875 mg total) by mouth daily with breakfast. 540 tablet 1  . cyclobenzaprine (FLEXERIL) 10 MG tablet Take 1 tablet (10 mg total) by mouth 3 (three) times daily as needed for muscle spasms. 30 tablet 0  . diazepam (VALIUM) 5 MG tablet Take 1 tablet (5 mg total) by mouth every 8 (eight) hours as needed for anxiety. (Patient taking differently: Take 5 mg by mouth at bedtime. ) 20 tablet 0  . docusate sodium (COLACE) 100 MG capsule Take 200 mg by mouth 2 (two) times daily. Taken at 08:00 and 20:00    . gabapentin (NEURONTIN) 300 MG capsule Take 3 capsules (900 mg total) by mouth 2 (two) times daily. 540 capsule 1  . HYDROcodone-acetaminophen (NORCO) 7.5-325 MG tablet Take 1 tablet by mouth 2 (two) times daily.    . Insulin Pen Needle (BD PEN NEEDLE NANO U/F) 32G X 4 MM MISC 1 Package by Does not apply route 2 (two) times daily. 100 each 0  . liraglutide (VICTOZA) 18 MG/3ML SOPN Inject 0.1 mLs (0.6 mg total) into the skin every morning. 1 pen 0  . lisinopril-hydrochlorothiazide (PRINZIDE,ZESTORETIC) 10-12.5 MG tablet Take 1 tablet by mouth at bedtime. 0000 90 tablet 1  . metFORMIN (GLUCOPHAGE-XR) 500 MG 24 hr tablet TAKE 2 TABLETS (1,000 MG TOTAL) BY MOUTH 2 (TWO) TIMES DAILY. (Patient taking differently: Take 1,000 mg by mouth 2 (two) times daily. TAKE 2 TABLETS (1,000 MG TOTAL) BY MOUTH 2 (TWO) TIMES DAILY.) 360 tablet 1  . NON FORMULARY CPAP machine at bedtime & sleep    . polyethylene glycol (MIRALAX / GLYCOLAX) packet Take 17 g by mouth daily. Taken at 24:00     No current facility-administered medications on file prior to visit.     PAST MEDICAL HISTORY: Past Medical History:  Diagnosis Date  . ADEM (acute disseminated encephalomyelitis)   . Allergy   . Anxiety 2005  . Complication of anesthesia   . Constipation   . Depression 2005  . Diabetes mellitus without complication (HCC)    Type 2  . Difficult intubation     difficult intubating and exbutating "throat Clinched around tube" during an Endoscopy procedure  . Dyspnea    with exertion  . Headache(784.0)   . Hearing loss   . Hyperinsulinemia 07/04/11   "I'm on metformin cause I produce too much insulin"  . Hyperlipidemia   . Hypertension   . Mononucleosis, infectious, with hepatitis 06/2011  . MS (multiple sclerosis) (HCC)    patinet has ADEM not MS per Patient  . Nausea    occasional per history form 08/18/11  . Neuromuscular disorder (HCC)   . Pneumonia 2010  . Sleep apnea    uses CPAP at home  . Stroke (HCC) 2000   TIA  . TIA (transient ischemic attack) 2000   denies residual  . TIA (transient ischemic attack)     PAST SURGICAL HISTORY: Past Surgical History:  Procedure Laterality Date  . ANTERIOR CERVICAL DECOMP/DISCECTOMY FUSION N/A  10/21/2016   Procedure: ANTERIOR CERVICAL DECOMPRESSION/DISCECTOMY FUSION, INTERBODY PROSTHESIS,PLATE CERVICAL FOUR- CERVICAL FIVE, CERVICAL FIVE- CERVICAL SIX;  Surgeon: Tressie StalkerJeffrey Jenkins, MD;  Location: [facility] OR;  Service: Neurosurgery;  Laterality: N/A;  . ANTERIOR CERVICAL DECOMP/DISCECTOMY FUSION N/A 05/19/2018   Procedure: ANTERIOR CERVICAL DECOMPRESSION/DISCECTOMY FUSION, INTERBODY PROSTHESIS,PLATE/SCREWS CERVICAL SIX- CERVICAL SEVEN; EXPLORE FUSION;  Surgeon: Tressie StalkerJenkins, Jeffrey, MD;  [facility];  Service: Neurosurgery;  Laterality: N/A;  . CHOLECYSTECTOMY  08/02/2011   Procedure: LAPAROSCOPIC CHOLECYSTECTOMY;  Surgeon: Shelly Rubensteinouglas A Blackman, MD;  Location: [facility] OR;  Service: General;;  . COLONOSCOPY  2003  . ESOPHAGOGASTRODUODENOSCOPY  2003  . ESOPHAGOGASTRODUODENOSCOPY  07/28/2011   Procedure: ESOPHAGOGASTRODUODENOSCOPY (EGD);  Surgeon: Yancey FlemingsJohn Perry, MD;  [facility];  Service: Endoscopy;  Laterality: N/A;  . MYRINGOTOMY  1979; 1980   bilaterally  . TONSILLECTOMY AND ADENOIDECTOMY  ~ 1980    SOCIAL HISTORY: Social History   Tobacco Use  . Smoking status: Former Smoker    Packs/day: 3.00      Years: 3.00    Pack years: 9.00    Types: Cigarettes    Last attempt to quit: 08/18/1987    Years since quitting: 30.9  . Smokeless tobacco: Never Used  Substance Use Topics  . Alcohol use: Not Currently    Alcohol/week: 0.0 standard drinks    Comment: 07/04/11 "glass of wine or spirits once a month"  . Drug use: Not Currently    Types: Marijuana    Comment: have not used in a 1.5 years    FAMILY HISTORY: Family History  Problem Relation Age of Onset  . Coronary artery disease Father   . Stroke Father   . Cancer Father   . High blood pressure Father   . Alcoholism Father   . Diabetes Mother   . Obesity Mother   . Cancer Maternal Grandmother        bone  . Cancer Other        Stomach -Great-great grandmother    ROS: Review of Systems  Constitutional: Positive for weight loss.  Gastrointestinal: Negative for nausea and vomiting.  Musculoskeletal:       Negative for muscle weakness.    PHYSICAL EXAM: Blood pressure 118/71, pulse 77, temperature 98.1 F (36.7 C), temperature source Oral, height 5\' 8"  (1.727 m), weight 285 lb (129.3 kg), SpO2 (!) 6 %. Body mass index is 43.33 kg/m. Physical Exam Vitals signs reviewed.  Constitutional:      Appearance: Normal appearance. He is obese.  Cardiovascular:     Rate and Rhythm: Normal rate.  Pulmonary:     Effort: Pulmonary effort is normal.  Musculoskeletal: Normal range of motion.  Skin:    General: Skin is warm and dry.  Neurological:     Mental Status: He is alert and oriented to person, place, and time.  Psychiatric:        Mood and Affect: Mood normal.        Behavior: Behavior normal.     RECENT LABS AND TESTS: BMET    Component Value Date/Time   NA 139 05/13/2018 0957   NA 143 02/16/2018 1037   K 3.2 (L) 05/13/2018 0957   CL 104 05/13/2018 0957   CO2 28 05/13/2018 0957   GLUCOSE 83 05/13/2018 0957   BUN 20 05/13/2018 0957   BUN 14 02/16/2018 1037   CREATININE 0.99 05/13/2018 0957   CREATININE  1.02 08/24/2017 1458   CALCIUM 9.3 05/13/2018 0957   GFRNONAA >60 05/13/2018 0957   GFRNONAA >89  07/18/2015 1656   GFRAA >60 05/13/2018 0957   GFRAA >89 07/18/2015 1656   Lab Results  Component Value Date   HGBA1C 4.9 05/19/2018   HGBA1C 5.0 05/13/2018   HGBA1C 5.5 02/16/2018   HGBA1C 5.7 (H) 09/15/2017   HGBA1C 5.6 08/24/2017   Lab Results  Component Value Date   INSULIN 60.7 (H) 02/16/2018   INSULIN 110.6 (H) 09/15/2017   CBC    Component Value Date/Time   WBC 5.0 05/13/2018 0957   RBC 5.71 05/13/2018 0957   HGB 15.3 05/13/2018 0957   HGB 16.6 09/15/2017 0902   HCT 47.0 05/13/2018 0957   HCT 49.5 09/15/2017 0902   PLT 161 05/13/2018 0957   PLT 158 09/17/2016 1611   MCV 82.3 05/13/2018 0957   MCV 86 09/15/2017 0902   MCH 26.8 05/13/2018 0957   MCHC 32.6 05/13/2018 0957   RDW 13.4 05/13/2018 0957   RDW 14.7 09/15/2017 0902   LYMPHSABS 2.2 09/15/2017 0902   MONOABS 0.5 12/05/2014 1538   EOSABS 0.1 09/15/2017 0902   BASOSABS 0.0 09/15/2017 0902   Iron/TIBC/Ferritin/ %Sat No results found for: IRON, TIBC, FERRITIN, IRONPCTSAT Lipid Panel     Component Value Date/Time   CHOL 116 02/16/2018 1037   TRIG 340 (H) 02/16/2018 1037   HDL 27 (L) 02/16/2018 1037   CHOLHDL 3.4 08/24/2017 1458   VLDL 70 (H) 10/15/2015 1112   LDLCALC 21 02/16/2018 1037   LDLCALC 57 08/24/2017 1458   Hepatic Function Panel     Component Value Date/Time   PROT 6.5 05/13/2018 0957   PROT 6.7 02/16/2018 1037   ALBUMIN 4.0 05/13/2018 0957   ALBUMIN 4.4 02/16/2018 1037   AST 29 05/13/2018 0957   ALT 62 (H) 05/13/2018 0957   ALKPHOS 49 05/13/2018 0957   BILITOT 0.7 05/13/2018 0957   BILITOT 0.4 02/16/2018 1037   BILIDIR 0.1 04/11/2015 1638   IBILI 0.6 04/11/2015 1638      Component Value Date/Time   TSH 2.080 09/15/2017 0902   TSH 3.190 09/17/2016 1611   TSH 2.339 12/05/2014 1538   Results for YANNI, QUIROA "[name]" (MRN [id number]) as of 07/14/2018 15:12  Ref. Range 02/16/2018  10:37  Vitamin D, 25-Hydroxy Latest Ref Range: 30.0 - 100.0 ng/mL 28.6 (L)     OBESITY BEHAVIORAL INTERVENTION VISIT  Today's visit was # 14   Starting weight: 305 lbs Starting date: 09/15/17 Today's weight : Weight: 285 lb (129.3 kg)  Today's date: 07/13/2018 Total lbs lost to date: 20 At least 15 minutes were spent on discussing the following behavioral intervention visit.  ASK: We discussed the diagnosis of obesity with Brandon [name] today and Brandon [name] agreed to give [place] permission to discuss obesity behavioral modification therapy today.  ASSESS: Brandon [name] has the diagnosis of obesity and his BMI today is 43.3. Brandon [name] is in the action stage of change.   ADVISE: Brandon [name] was educated on the multiple health risks of obesity as well as the benefit of weight loss to improve his health. He was advised of the need for long term treatment and the importance of lifestyle modifications to improve his current health and to decrease his risk of future health problems.  AGREE: Multiple dietary modification options and treatment options were discussed and Brandon [name] agreed to follow the recommendations documented in the above note.  ARRANGE: Brandon [name] was educated on the importance of frequent visits to treat obesity as outlined per CMS and USPSTF guidelines and agreed to schedule his next follow up appointment  today.  Launa FlightI, Cara Soares, am acting as transcriptionist for Wilder Gladearen D. Timithy Arons, MD I have reviewed the above documentation for accuracy and completeness, and I agree with the above. -Quillian Quincearen Marcques Wrightsman, MD

## 2018-08-03 ENCOUNTER — Ambulatory Visit (INDEPENDENT_AMBULATORY_CARE_PROVIDER_SITE_OTHER): Payer: 59 | Admitting: Family Medicine

## 2018-08-03 ENCOUNTER — Encounter (INDEPENDENT_AMBULATORY_CARE_PROVIDER_SITE_OTHER): Payer: Self-pay | Admitting: Family Medicine

## 2018-08-03 VITALS — BP 130/78 | HR 82 | Temp 98.0°F | Ht 68.0 in | Wt 285.0 lb

## 2018-08-03 DIAGNOSIS — Z6841 Body Mass Index (BMI) 40.0 and over, adult: Secondary | ICD-10-CM | POA: Diagnosis not present

## 2018-08-03 DIAGNOSIS — E559 Vitamin D deficiency, unspecified: Secondary | ICD-10-CM

## 2018-08-03 MED ORDER — VITAMIN D (ERGOCALCIFEROL) 1.25 MG (50000 UNIT) PO CAPS: 50000.0000 [IU] | ORAL_CAPSULE | ORAL | 0 refills | Status: DC

## 2018-08-04 NOTE — Progress Notes (Signed)
Office: [phone]  /  Fax: [phone]   HPI:   Chief Complaint: OBESITY Brandon [name] is here to discuss his progress with his obesity treatment plan. He is on the Category 4 plan + 500 calories and is following his eating plan approximately 70 % of the time. He states he is walking 10 minutes 5 to 6 times per week. Brandon [name] has done well with maintaining weight, but he hasn't been concentrating on his diet as closely this month. He will be doing a lot of traveling in February and is worried about weight gain.  His weight is 285 lb (129.3 kg) today and has not lost weight since his last visit. He has lost 20 lbs since starting treatment with us.  Vitamin D deficiency Brandon [name] has a diagnosis of vitamin D deficiency. He is currently stable on vit D, but is not yet at goal. He denies nausea, vomiting, or muscle weakness.  ASSESSMENT AND PLAN:  Vitamin D deficiency - Plan: Vitamin D, Ergocalciferol, (DRISDOL) 1.25 MG (50000 UT) CAPS capsule  Class 3 severe obesity with serious comorbidity and body mass index (BMI) of 40.0 to 44.9 in adult, unspecified obesity type (HCC)  PLAN:  Vitamin D Deficiency Brandon [name] was informed that low vitamin D levels contributes to fatigue and are associated with obesity, breast, and colon cancer. He agrees to continue to take prescription Vit D @50 ,000 IU every week #4 with no refills and will follow up for routine testing of vitamin D, at least 2-3 times per year. He was informed of the risk of over-replacement of vitamin D and agrees to not increase his dose unless he discusses this with us first. Brandon [name] agrees to follow up in 4 weeks.  Obesity Brandon [name] is currently in the action stage of change. As such, his goal is to continue with weight loss efforts. He has agreed to keep a food journal with 1800 to 2300 calories and 120 grams of protein.  Brandon [name] has been instructed to work up to a goal of 150 minutes of combined cardio and strengthening exercise per week  for weight loss and overall health benefits. We discussed the following Behavioral Modification Strategies today: increasing lean protein intake, work on meal planning and easy cooking plans, and travel eating strategies.   Brandon [name] has agreed to follow up with our clinic in 4 weeks. He was informed of the importance of frequent follow up visits to maximize his success with intensive lifestyle modifications for his multiple health conditions.  ALLERGIES: Allergies  Allergen Reactions  . Amoxicillin Anaphylaxis    Has patient had a PCN reaction causing immediate rash, facial/tongue/throat swelling, SOB or lightheadedness with hypotension: Yes Has patient had a PCN reaction causing severe rash involving mucus membranes or skin necrosis: No Has patient had a PCN reaction that required hospitalization: No Has patient had a PCN reaction occurring within the last 10 years: No If all of the above answers are "NO", then may proceed with Cephalosporin use.   [name]. Penicillin G Anaphylaxis    Gets extremely high fevers. Has patient had a PCN reaction causing immediate rash, facial/tongue/throat swelling, SOB or lightheadedness with hypotension: Yes Has patient had a PCN reaction causing severe rash involving mucus membranes or skin necrosis: No Has patient had a PCN reaction that required hospitalization: No Has patient had a PCN reaction occurring within the last 10 years: No If all of the above answers are "NO", then may proceed with Cephalosporin use.   . Sulfa Antibiotics Other (See Comments)  It will kill him  . Cephalosporins Other (See Comments)    Reaction unspecified.  . Quinolones Other (See Comments)    Reaction unspecified  . Sulfamethoxazole Swelling    MEDICATIONS: Current Outpatient Medications on File Prior to Visit  Medication Sig Dispense Refill  . atorvastatin (LIPITOR) 20 MG tablet TAKE 1 TABLET (20 MG TOTAL) BY MOUTH AT BEDTIME. (Patient taking differently: Take 20 mg by  mouth at bedtime. ) 30 tablet 0  . colesevelam (WELCHOL) 625 MG tablet Take 3 tablets (1,875 mg total) by mouth daily with breakfast. 540 tablet 1  . cyclobenzaprine (FLEXERIL) 10 MG tablet Take 1 tablet (10 mg total) by mouth 3 (three) times daily as needed for muscle spasms. 30 tablet 0  . diazepam (VALIUM) 5 MG tablet Take 1 tablet (5 mg total) by mouth every 8 (eight) hours as needed for anxiety. (Patient taking differently: Take 5 mg by mouth at bedtime. ) 20 tablet 0  . docusate sodium (COLACE) 100 MG capsule Take 200 mg by mouth 2 (two) times daily. Taken at 08:00 and 20:00    . gabapentin (NEURONTIN) 300 MG capsule Take 3 capsules (900 mg total) by mouth 2 (two) times daily. 540 capsule 1  . Insulin Pen Needle (BD PEN NEEDLE NANO U/F) 32G X 4 MM MISC 1 Package by Does not apply route 2 (two) times daily. 100 each 0  . liraglutide (VICTOZA) 18 MG/3ML SOPN Inject 0.1 mLs (0.6 mg total) into the skin every morning. 1 pen 0  . lisinopril-hydrochlorothiazide (PRINZIDE,ZESTORETIC) 10-12.5 MG tablet Take 1 tablet by mouth at bedtime. 0000 90 tablet 1  . metFORMIN (GLUCOPHAGE-XR) 500 MG 24 hr tablet TAKE 2 TABLETS (1,000 MG TOTAL) BY MOUTH 2 (TWO) TIMES DAILY. (Patient taking differently: Take 1,000 mg by mouth 2 (two) times daily. TAKE 2 TABLETS (1,000 MG TOTAL) BY MOUTH 2 (TWO) TIMES DAILY.) 360 tablet 1  . NON FORMULARY CPAP machine at bedtime & sleep     No current facility-administered medications on file prior to visit.     PAST MEDICAL HISTORY: Past Medical History:  Diagnosis Date  . ADEM (acute disseminated encephalomyelitis)   . Allergy   . Anxiety 2005  . Complication of anesthesia   . Constipation   . Depression 2005  . Diabetes mellitus without complication (HCC)    Type 2  . Difficult intubation    difficult intubating and exbutating "throat Clinched around tube" during an Endoscopy procedure  . Dyspnea    with exertion  . Headache(784.0)   . Hearing loss   .  Hyperinsulinemia 07/04/11   "I'm on metformin cause I produce too much insulin"  . Hyperlipidemia   . Hypertension   . Mononucleosis, infectious, with hepatitis 06/2011  . MS (multiple sclerosis) (HCC)    patinet has ADEM not MS per Patient  . Nausea    occasional per history form 08/18/11  . Neuromuscular disorder (HCC)   . Pneumonia 2010  . Sleep apnea    uses CPAP at home  . Stroke (HCC) 2000   TIA  . TIA (transient ischemic attack) 2000   denies residual  . TIA (transient ischemic attack)     PAST SURGICAL HISTORY: Past Surgical History:  Procedure Laterality Date  . ANTERIOR CERVICAL DECOMP/DISCECTOMY FUSION N/A 10/21/2016   Procedure: ANTERIOR CERVICAL DECOMPRESSION/DISCECTOMY FUSION, INTERBODY PROSTHESIS,PLATE CERVICAL FOUR- CERVICAL FIVE, CERVICAL FIVE- CERVICAL SIX;  Surgeon: Tressie Stalker, MD;  Location: [facility] OR;  Service: Neurosurgery;  Laterality: N/A;  . ANTERIOR  CERVICAL DECOMP/DISCECTOMY FUSION N/A 05/19/2018   Procedure: ANTERIOR CERVICAL DECOMPRESSION/DISCECTOMY FUSION, INTERBODY PROSTHESIS,PLATE/SCREWS CERVICAL SIX- CERVICAL SEVEN; EXPLORE FUSION;  Surgeon: Tressie Stalker, MD;  Location: [facility] OR;  Service: Neurosurgery;  Laterality: N/A;  . CHOLECYSTECTOMY  08/02/2011   Procedure: LAPAROSCOPIC CHOLECYSTECTOMY;  Surgeon: Shelly Rubenstein, MD;  Location: [facility] OR;  Service: General;;  . COLONOSCOPY  2003  . ESOPHAGOGASTRODUODENOSCOPY  2003  . ESOPHAGOGASTRODUODENOSCOPY  07/28/2011   Procedure: ESOPHAGOGASTRODUODENOSCOPY (EGD);  Surgeon: [name], MD;  Location: [facility] ENDOSCOPY;  Service: Endoscopy;  Laterality: N/A;  . MYRINGOTOMY  1979; 1980   bilaterally  . TONSILLECTOMY AND ADENOIDECTOMY  ~ 1980    SOCIAL HISTORY: Social History   Tobacco Use  . Smoking status: Former Smoker    Packs/day: 3.00    Years: 3.00    Pack years: 9.00    Types: Cigarettes    Last attempt to quit: 08/18/1987    Years since quitting: 30.9  . Smokeless tobacco: Never Used    Substance Use Topics  . Alcohol use: Not Currently    Alcohol/week: 0.0 standard drinks    Comment: 07/04/11 "glass of wine or spirits once a month"  . Drug use: Not Currently    Types: Marijuana    Comment: have not used in a 1.5 years    FAMILY HISTORY: Family History  Problem Relation Age of Onset  . Coronary artery disease Father   . Stroke Father   . Cancer Father   . High blood pressure Father   . Alcoholism Father   . Diabetes Mother   . Obesity Mother   . Cancer Maternal Grandmother        bone  . Cancer Other        Stomach -Great-great grandmother    ROS: Review of Systems  Constitutional: Negative for weight loss.  Gastrointestinal: Negative for nausea and vomiting.  Musculoskeletal:       Negative for muscle weakness.    PHYSICAL EXAM: Blood pressure 130/78, pulse 82, temperature 98 F (36.7 C), temperature source Oral, height 5\' 8"  (1.727 m), weight 285 lb (129.3 kg), SpO2 96 %. Body mass index is 43.33 kg/m. Physical Exam Vitals signs reviewed.  Constitutional:      Appearance: Normal appearance. He is obese.  Cardiovascular:     Rate and Rhythm: Normal rate.  Pulmonary:     Effort: Pulmonary effort is normal.  Musculoskeletal: Normal range of motion.  Skin:    General: Skin is warm and dry.  Neurological:     Mental Status: He is alert and oriented to person, place, and time.  Psychiatric:        Mood and Affect: Mood normal.        Behavior: Behavior normal.     RECENT LABS AND TESTS: BMET    Component Value Date/Time   NA 139 05/13/2018 0957   NA 143 02/16/2018 1037   K 3.2 (L) 05/13/2018 0957   CL 104 05/13/2018 0957   CO2 28 05/13/2018 0957   GLUCOSE 83 05/13/2018 0957   BUN 20 05/13/2018 0957   BUN 14 02/16/2018 1037   CREATININE 0.99 05/13/2018 0957   CREATININE 1.02 08/24/2017 1458   CALCIUM 9.3 05/13/2018 0957   GFRNONAA >60 05/13/2018 0957   GFRNONAA >89 07/18/2015 1656   GFRAA >60 05/13/2018 0957   GFRAA >89  07/18/2015 1656   Lab Results  Component Value Date   HGBA1C 4.9 05/19/2018   HGBA1C 5.0 05/13/2018   HGBA1C 5.5 02/16/2018  HGBA1C 5.7 (H) 09/15/2017   HGBA1C 5.6 08/24/2017   Lab Results  Component Value Date   INSULIN 60.7 (H) 02/16/2018   INSULIN 110.6 (H) 09/15/2017   CBC    Component Value Date/Time   WBC 5.0 05/13/2018 0957   RBC 5.71 05/13/2018 0957   HGB 15.3 05/13/2018 0957   HGB 16.6 09/15/2017 0902   HCT 47.0 05/13/2018 0957   HCT 49.5 09/15/2017 0902   PLT 161 05/13/2018 0957   PLT 158 09/17/2016 1611   MCV 82.3 05/13/2018 0957   MCV 86 09/15/2017 0902   MCH 26.8 05/13/2018 0957   MCHC 32.6 05/13/2018 0957   RDW 13.4 05/13/2018 0957   RDW 14.7 09/15/2017 0902   LYMPHSABS 2.2 09/15/2017 0902   MONOABS 0.5 12/05/2014 1538   EOSABS 0.1 09/15/2017 0902   BASOSABS 0.0 09/15/2017 0902   Iron/TIBC/Ferritin/ %Sat No results found for: IRON, TIBC, FERRITIN, IRONPCTSAT Lipid Panel     Component Value Date/Time   CHOL 116 02/16/2018 1037   TRIG 340 (H) 02/16/2018 1037   HDL 27 (L) 02/16/2018 1037   CHOLHDL 3.4 08/24/2017 1458   VLDL 70 (H) 10/15/2015 1112   LDLCALC 21 02/16/2018 1037   LDLCALC 57 08/24/2017 1458   Hepatic Function Panel     Component Value Date/Time   PROT 6.5 05/13/2018 0957   PROT 6.7 02/16/2018 1037   ALBUMIN 4.0 05/13/2018 0957   ALBUMIN 4.4 02/16/2018 1037   AST 29 05/13/2018 0957   ALT 62 (H) 05/13/2018 0957   ALKPHOS 49 05/13/2018 0957   BILITOT 0.7 05/13/2018 0957   BILITOT 0.4 02/16/2018 1037   BILIDIR 0.1 04/11/2015 1638   IBILI 0.6 04/11/2015 1638      Component Value Date/Time   TSH 2.080 09/15/2017 0902   TSH 3.190 09/17/2016 1611   TSH 2.339 12/05/2014 1538   Results for QUINTAVIUS, NIEBUHR "[name]" (MRN [id number]) as of 08/04/2018 13:03  Ref. Range 02/16/2018 10:37  Vitamin D, 25-Hydroxy Latest Ref Range: 30.0 - 100.0 ng/mL 28.6 (L)   OBESITY BEHAVIORAL INTERVENTION VISIT  Today's visit was # 15   Starting  weight: 305 Starting date: 09/15/17 Today's weight : Weight: 285 lb (129.3 kg)  Today's date: 08/03/2018 Total lbs lost to date: 20 At least 15 minutes were spent on discussing the following behavioral intervention visit.  ASK: We discussed the diagnosis of obesity with Brandon [name] today and Brandon [name] agreed to give [place] permission to discuss obesity behavioral modification therapy today.  ASSESS: Brandon [name] has the diagnosis of obesity and his BMI today is 43.3. Brandon [name] is in the action stage of change.  ADVISE: Brandon [name] was educated on the multiple health risks of obesity as well as the benefit of weight loss to improve his health. He was advised of the need for long term treatment and the importance of lifestyle modifications to improve his current health and to decrease his risk of future health problems.  AGREE: Multiple dietary modification options and treatment options were discussed and Brandon [name] agreed to follow the recommendations documented in the above note.  ARRANGE: Brandon [name] was educated on the importance of frequent visits to treat obesity as outlined per CMS and USPSTF guidelines and agreed to schedule his next follow up appointment today.  IKirke Corin, CMA, am acting as transcriptionist for Wilder Glade, MD  I have reviewed the above documentation for accuracy and completeness, and I agree with the above. -Quillian Quince, MD

## 2018-08-17 DIAGNOSIS — M25562 Pain in left knee: Secondary | ICD-10-CM | POA: Diagnosis not present

## 2018-08-17 DIAGNOSIS — M25561 Pain in right knee: Secondary | ICD-10-CM | POA: Diagnosis not present

## 2018-08-17 DIAGNOSIS — G8929 Other chronic pain: Secondary | ICD-10-CM | POA: Diagnosis not present

## 2018-08-31 ENCOUNTER — Ambulatory Visit (INDEPENDENT_AMBULATORY_CARE_PROVIDER_SITE_OTHER): Payer: 59 | Admitting: Family Medicine

## 2018-08-31 ENCOUNTER — Encounter (INDEPENDENT_AMBULATORY_CARE_PROVIDER_SITE_OTHER): Payer: Self-pay | Admitting: Family Medicine

## 2018-08-31 VITALS — BP 136/77 | HR 76 | Temp 97.9°F | Ht 68.0 in | Wt 289.0 lb

## 2018-08-31 DIAGNOSIS — Z6841 Body Mass Index (BMI) 40.0 and over, adult: Secondary | ICD-10-CM | POA: Diagnosis not present

## 2018-08-31 DIAGNOSIS — E66813 Obesity, class 3: Secondary | ICD-10-CM

## 2018-08-31 DIAGNOSIS — E119 Type 2 diabetes mellitus without complications: Secondary | ICD-10-CM

## 2018-08-31 NOTE — Progress Notes (Signed)
Office: [phone]  /  Fax: [phone]   HPI:   Chief Complaint: OBESITY Brandon [name] is here to discuss his progress with his obesity treatment plan. He is on the keep a food journal with 1800-2300 calories and 120 grams of protein daily and is following his eating plan approximately 10 % of the time. He states he is walking for 10 minutes 6 times per week. Doan increase traveling and eating out with increased celebrations eating, but he tried to make smarter choices. He knew he would gain weight.  His weight is 289 lb (131.1 kg) today and has gained 4 pounds since his last visit. He has lost 16 lbs since starting treatment with [place].  Diabetes II Said has a diagnosis of diabetes type II. Markevion is stable on metformin and Victoza. He stopped Victoza fro a few days and noted a significant increase in polyphagia. He restarted Victoza and is doing better. He states he is not checking BGs at home. He denies hypoglycemia. Last A1c was 4.9. He has been working on intensive lifestyle modifications including diet, exercise, and weight loss to help control his blood glucose levels.  ASSESSMENT AND PLAN:  Type 2 diabetes mellitus without complication, without long-term current use of insulin (HCC)  Class 3 severe obesity with serious comorbidity and body mass index (BMI) of 40.0 to 44.9 in adult, unspecified obesity type (HCC)  PLAN:  Diabetes II Brandon [name] has been given extensive diabetes education by myself today including ideal fasting and post-prandial blood glucose readings, individual ideal Hgb A1c goals and hypoglycemia prevention. We discussed the importance of good blood sugar control to decrease the likelihood of diabetic complications such as nephropathy, neuropathy, limb loss, blindness, coronary artery disease, and death. We discussed the importance of intensive lifestyle modification including diet, exercise and weight loss as the first line treatment for diabetes. Brandon [name] agrees to  continue metformin and Victoza, and he will get back to diet. Brandon [name] agrees to follow up with our clinic in 3 to 4 weeks.  I spent > than 50% of the 25 minute visit on counseling as documented in the note.  Obesity [name] is currently in the action stage of change. As such, his goal is to continue with weight loss efforts He has agreed to keep a food journal with 1800-2300 calories and 120+ grams of protein daily Brandon [name] has been instructed to work up to a goal of 150 minutes of combined cardio and strengthening exercise per week for weight loss and overall health benefits. We discussed the following Behavioral Modification Strategies today: increasing lean protein intake, decreasing simple carbohydrates  and work on meal planning and easy cooking plans   Brandon [name] has agreed to follow up with our clinic in 3 to 4 weeks. He was informed of the importance of frequent follow up visits to maximize his success with intensive lifestyle modifications for his multiple health conditions.  ALLERGIES: Allergies  Allergen Reactions  . Amoxicillin Anaphylaxis    Has patient had a PCN reaction causing immediate rash, facial/tongue/throat swelling, SOB or lightheadedness with hypotension: Yes Has patient had a PCN reaction causing severe rash involving mucus membranes or skin necrosis: No Has patient had a PCN reaction that required hospitalization: No Has patient had a PCN reaction occurring within the last 10 years: No If all of the above answers are "NO", then may proceed with Cephalosporin use.   Brandon [name] Kitchen Penicillin G Anaphylaxis    Gets extremely high fevers. Has patient had a PCN reaction causing immediate rash,  facial/tongue/throat swelling, SOB or lightheadedness with hypotension: Yes Has patient had a PCN reaction causing severe rash involving mucus membranes or skin necrosis: No Has patient had a PCN reaction that required hospitalization: No Has patient had a PCN reaction occurring within the last  10 years: No If all of the above answers are "NO", then may proceed with Cephalosporin use.   . Sulfa Antibiotics Other (See Comments)    It will kill him  . Cephalosporins Other (See Comments)    Reaction unspecified.  . Quinolones Other (See Comments)    Reaction unspecified  . Sulfamethoxazole Swelling    MEDICATIONS: Current Outpatient Medications on File Prior to Visit  Medication Sig Dispense Refill  . atorvastatin (LIPITOR) 20 MG tablet TAKE 1 TABLET (20 MG TOTAL) BY MOUTH AT BEDTIME. (Patient taking differently: Take 20 mg by mouth at bedtime. ) 30 tablet 0  . colesevelam (WELCHOL) 625 MG tablet Take 3 tablets (1,875 mg total) by mouth daily with breakfast. 540 tablet 1  . cyclobenzaprine (FLEXERIL) 10 MG tablet Take 1 tablet (10 mg total) by mouth 3 (three) times daily as needed for muscle spasms. 30 tablet 0  . diazepam (VALIUM) 5 MG tablet Take 1 tablet (5 mg total) by mouth every 8 (eight) hours as needed for anxiety. (Patient taking differently: Take 5 mg by mouth at bedtime. ) 20 tablet 0  . docusate sodium (COLACE) 100 MG capsule Take 200 mg by mouth 2 (two) times daily. Taken at 08:00 and 20:00    . gabapentin (NEURONTIN) 300 MG capsule Take 3 capsules (900 mg total) by mouth 2 (two) times daily. 540 capsule 1  . Insulin Pen Needle (BD PEN NEEDLE NANO U/F) 32G X 4 MM MISC 1 Package by Does not apply route 2 (two) times daily. 100 each 0  . liraglutide (VICTOZA) 18 MG/3ML SOPN Inject 0.1 mLs (0.6 mg total) into the skin every morning. 1 pen 0  . lisinopril-hydrochlorothiazide (PRINZIDE,ZESTORETIC) 10-12.5 MG tablet Take 1 tablet by mouth at bedtime. 0000 90 tablet 1  . metFORMIN (GLUCOPHAGE-XR) 500 MG 24 hr tablet TAKE 2 TABLETS (1,000 MG TOTAL) BY MOUTH 2 (TWO) TIMES DAILY. (Patient taking differently: Take 1,000 mg by mouth 2 (two) times daily. TAKE 2 TABLETS (1,000 MG TOTAL) BY MOUTH 2 (TWO) TIMES DAILY.) 360 tablet 1  . NON FORMULARY CPAP machine at bedtime & sleep    .  Vitamin D, Ergocalciferol, (DRISDOL) 1.25 MG (50000 UT) CAPS capsule Take 1 capsule (50,000 Units total) by mouth every 7 (seven) days. 4 capsule 0   No current facility-administered medications on file prior to visit.     PAST MEDICAL HISTORY: Past Medical History:  Diagnosis Date  . ADEM (acute disseminated encephalomyelitis)   . Allergy   . Anxiety 2005  . Complication of anesthesia   . Constipation   . Depression 2005  . Diabetes mellitus without complication (HCC)    Type 2  . Difficult intubation    difficult intubating and exbutating "throat Clinched around tube" during an Endoscopy procedure  . Dyspnea    with exertion  . Headache(784.0)   . Hearing loss   . Hyperinsulinemia 07/04/11   "I'm on metformin cause I produce too much insulin"  . Hyperlipidemia   . Hypertension   . Mononucleosis, infectious, with hepatitis 06/2011  . MS (multiple sclerosis) (HCC)    patinet has ADEM not MS per Patient  . Nausea    occasional per history form 08/18/11  . Neuromuscular disorder (  HCC)   . Pneumonia 2010  . Sleep apnea    uses CPAP at home  . Stroke (HCC) 2000   TIA  . TIA (transient ischemic attack) 2000   denies residual  . TIA (transient ischemic attack)     PAST SURGICAL HISTORY: Past Surgical History:  Procedure Laterality Date  . ANTERIOR CERVICAL DECOMP/DISCECTOMY FUSION N/A 10/21/2016   Procedure: ANTERIOR CERVICAL DECOMPRESSION/DISCECTOMY FUSION, INTERBODY PROSTHESIS,PLATE CERVICAL FOUR- CERVICAL FIVE, CERVICAL FIVE- CERVICAL SIX;  Surgeon: Tressie Stalker, MD;  Location: [facility] OR;  Service: Neurosurgery;  Laterality: N/A;  . ANTERIOR CERVICAL DECOMP/DISCECTOMY FUSION N/A 05/19/2018   Procedure: ANTERIOR CERVICAL DECOMPRESSION/DISCECTOMY FUSION, INTERBODY PROSTHESIS,PLATE/SCREWS CERVICAL SIX- CERVICAL SEVEN; EXPLORE FUSION;  Surgeon: Tressie Stalker, MD;  Location: [facility] OR;  Service: Neurosurgery;  Laterality: N/A;  . CHOLECYSTECTOMY  08/02/2011   Procedure:  LAPAROSCOPIC CHOLECYSTECTOMY;  Surgeon: Shelly Rubenstein, MD;  Location: [facility] OR;  Service: General;;  . COLONOSCOPY  2003  . ESOPHAGOGASTRODUODENOSCOPY  2003  . ESOPHAGOGASTRODUODENOSCOPY  07/28/2011   Procedure: ESOPHAGOGASTRODUODENOSCOPY (EGD);  Surgeon: [name], MD;  Location: [facility] ENDOSCOPY;  Service: Endoscopy;  Laterality: N/A;  . MYRINGOTOMY  1979; 1980   bilaterally  . TONSILLECTOMY AND ADENOIDECTOMY  ~ 1980    SOCIAL HISTORY: Social History   Tobacco Use  . Smoking status: Former Smoker    Packs/day: 3.00    Years: 3.00    Pack years: 9.00    Types: Cigarettes    Last attempt to quit: 08/18/1987    Years since quitting: 31.0  . Smokeless tobacco: Never Used  Substance Use Topics  . Alcohol use: Not Currently    Alcohol/week: 0.0 standard drinks    Comment: 07/04/11 "glass of wine or spirits once a month"  . Drug use: Not Currently    Types: Marijuana    Comment: have not used in a 1.5 years    FAMILY HISTORY: Family History  Problem Relation Age of Onset  . Coronary artery disease Father   . Stroke Father   . Cancer Father   . High blood pressure Father   . Alcoholism Father   . Diabetes Mother   . Obesity Mother   . Cancer Maternal Grandmother        bone  . Cancer Other        Stomach -Great-great grandmother    ROS: Review of Systems  Constitutional: Negative for weight loss.  Endo/Heme/Allergies:       Positive polyphagia Negative hypoglycemia    PHYSICAL EXAM: Blood pressure 136/77, pulse 76, temperature 97.9 F (36.6 C), temperature source Oral, height 5\' 8"  (1.727 m), weight 289 lb (131.1 kg), SpO2 96 %. Body mass index is 43.94 kg/m. Physical Exam Vitals signs reviewed.  Constitutional:      Appearance: Normal appearance. He is obese.  Cardiovascular:     Rate and Rhythm: Normal rate.     Pulses: Normal pulses.  Pulmonary:     Effort: Pulmonary effort is normal.     Breath sounds: Normal breath sounds.  Musculoskeletal: Normal  range of motion.  Skin:    General: Skin is warm and dry.  Neurological:     Mental Status: He is alert and oriented to person, place, and time.  Psychiatric:        Mood and Affect: Mood normal.        Behavior: Behavior normal.     RECENT LABS AND TESTS: BMET    Component Value Date/Time   NA 139 05/13/2018 0957  NA 143 02/16/2018 1037   K 3.2 (L) 05/13/2018 0957   CL 104 05/13/2018 0957   CO2 28 05/13/2018 0957   GLUCOSE 83 05/13/2018 0957   BUN 20 05/13/2018 0957   BUN 14 02/16/2018 1037   CREATININE 0.99 05/13/2018 0957   CREATININE 1.02 08/24/2017 1458   CALCIUM 9.3 05/13/2018 0957   GFRNONAA >60 05/13/2018 0957   GFRNONAA >89 07/18/2015 1656   GFRAA >60 05/13/2018 0957   GFRAA >89 07/18/2015 1656   Lab Results  Component Value Date   HGBA1C 4.9 05/19/2018   HGBA1C 5.0 05/13/2018   HGBA1C 5.5 02/16/2018   HGBA1C 5.7 (H) 09/15/2017   HGBA1C 5.6 08/24/2017   Lab Results  Component Value Date   INSULIN 60.7 (H) 02/16/2018   INSULIN 110.6 (H) 09/15/2017   CBC    Component Value Date/Time   WBC 5.0 05/13/2018 0957   RBC 5.71 05/13/2018 0957   HGB 15.3 05/13/2018 0957   HGB 16.6 09/15/2017 0902   HCT 47.0 05/13/2018 0957   HCT 49.5 09/15/2017 0902   PLT 161 05/13/2018 0957   PLT 158 09/17/2016 1611   MCV 82.3 05/13/2018 0957   MCV 86 09/15/2017 0902   MCH 26.8 05/13/2018 0957   MCHC 32.6 05/13/2018 0957   RDW 13.4 05/13/2018 0957   RDW 14.7 09/15/2017 0902   LYMPHSABS 2.2 09/15/2017 0902   MONOABS 0.5 12/05/2014 1538   EOSABS 0.1 09/15/2017 0902   BASOSABS 0.0 09/15/2017 0902   Iron/TIBC/Ferritin/ %Sat No results found for: IRON, TIBC, FERRITIN, IRONPCTSAT Lipid Panel     Component Value Date/Time   CHOL 116 02/16/2018 1037   TRIG 340 (H) 02/16/2018 1037   HDL 27 (L) 02/16/2018 1037   CHOLHDL 3.4 08/24/2017 1458   VLDL 70 (H) 10/15/2015 1112   LDLCALC 21 02/16/2018 1037   LDLCALC 57 08/24/2017 1458   Hepatic Function Panel       Component Value Date/Time   PROT 6.5 05/13/2018 0957   PROT 6.7 02/16/2018 1037   ALBUMIN 4.0 05/13/2018 0957   ALBUMIN 4.4 02/16/2018 1037   AST 29 05/13/2018 0957   ALT 62 (H) 05/13/2018 0957   ALKPHOS 49 05/13/2018 0957   BILITOT 0.7 05/13/2018 0957   BILITOT 0.4 02/16/2018 1037   BILIDIR 0.1 04/11/2015 1638   IBILI 0.6 04/11/2015 1638      Component Value Date/Time   TSH 2.080 09/15/2017 0902   TSH 3.190 09/17/2016 1611   TSH 2.339 12/05/2014 1538      OBESITY BEHAVIORAL INTERVENTION VISIT  Today's visit was # 16   Starting weight: 305 lbs Starting date: 09/15/17 Today's weight : 289 lbs Today's date: 08/31/2018 Total lbs lost to date: 16 At least 15 minutes were spent on discussing the following behavioral intervention visit.    08/31/2018  Height  (1.727 m)  Weight 289 lb (131.1 kg)  BMI (Calculated) 43.95  BLOOD PRESSURE - SYSTOLIC 136  BLOOD PRESSURE - DIASTOLIC 77   Body Fat % 36.4 %  Total Body Water (lbs) 126.8 lbs    ASK: We discussed the diagnosis of obesity with Dahlia Byes today and [name] agreed to give [place] permission to discuss obesity behavioral modification therapy today.  ASSESS: Kendrik has the diagnosis of obesity and his BMI today is 43.95 Brahim is in the action stage of change   ADVISE: Mani was educated on the multiple health risks of obesity as well as the benefit of weight loss to improve his health.  He was advised of the need for long term treatment and the importance of lifestyle modifications to improve his current health and to decrease his risk of future health problems.  AGREE: Multiple dietary modification options and treatment options were discussed and  Saw agreed to follow the recommendations documented in the above note.  ARRANGE: Abron was educated on the importance of frequent visits to treat obesity as outlined per CMS and USPSTF guidelines and agreed to schedule his next follow up appointment  today.  I, Burt Knack, am acting as transcriptionist for Quillian Quince, MD  I have reviewed the above documentation for accuracy and completeness, and I agree with the above. -Quillian Quince, MD

## 2018-09-07 DIAGNOSIS — M25461 Effusion, right knee: Secondary | ICD-10-CM | POA: Diagnosis not present

## 2018-09-07 DIAGNOSIS — M1711 Unilateral primary osteoarthritis, right knee: Secondary | ICD-10-CM | POA: Diagnosis not present

## 2018-09-26 ENCOUNTER — Other Ambulatory Visit: Payer: Self-pay

## 2018-09-26 ENCOUNTER — Encounter (INDEPENDENT_AMBULATORY_CARE_PROVIDER_SITE_OTHER): Payer: Self-pay | Admitting: Family Medicine

## 2018-09-26 ENCOUNTER — Ambulatory Visit (INDEPENDENT_AMBULATORY_CARE_PROVIDER_SITE_OTHER): Payer: 59 | Admitting: Family Medicine

## 2018-09-26 VITALS — BP 127/81 | HR 78 | Ht 68.0 in | Wt 287.0 lb

## 2018-09-26 DIAGNOSIS — Z6841 Body Mass Index (BMI) 40.0 and over, adult: Secondary | ICD-10-CM

## 2018-09-26 DIAGNOSIS — E559 Vitamin D deficiency, unspecified: Secondary | ICD-10-CM

## 2018-09-26 DIAGNOSIS — E538 Deficiency of other specified B group vitamins: Secondary | ICD-10-CM | POA: Diagnosis not present

## 2018-09-26 DIAGNOSIS — E119 Type 2 diabetes mellitus without complications: Secondary | ICD-10-CM | POA: Diagnosis not present

## 2018-09-26 DIAGNOSIS — E7849 Other hyperlipidemia: Secondary | ICD-10-CM

## 2018-09-26 MED ORDER — VITAMIN D (ERGOCALCIFEROL) 1.25 MG (50000 UNIT) PO CAPS: 50000.0000 [IU] | ORAL_CAPSULE | ORAL | 0 refills | Status: DC

## 2018-09-26 NOTE — Progress Notes (Signed)
Office: [phone]  /  Fax: [phone]   HPI:   Chief Complaint: OBESITY Brandon [name] is here to discuss his progress with his obesity treatment plan. He is on the follow the Category 4 plan + increased protein to 120 grams and increased calories by 500 and is following his eating plan approximately 70 % of the time. He states he is walking 10 to 20 minutes 1 to 2 times per week. Brandon [name] notes increased stress eating and challenged with getting to the grocery store during the COVID19 outbreak. He is sequestered at home mostly. He has questions about how to make better choices during this time.  His weight is 287 lb (130.2 kg) today and has had a weight loss of 2 pounds over a period of 3 weeks since his last visit. He has lost 18 lbs since starting treatment with [place].  Vitamin D Deficiency Brandon [name] has a diagnosis of vitamin D deficiency. He is currently stable on vit D.   Diabetes II Brandon [name] has a diagnosis of diabetes type II. Brandon [name] is on metformin and Victoza. His last A1c was 4.9 on 05/19/18. He does not have his blood sugar log today. He denies any nausea, vomiting, or hypoglycemic episodes. He is working on intensive lifestyle modifications including diet, exercise, and weight loss to help control his blood glucose levels.  Vitamin B12 Deficiency Brandon [name] is on metformin and is at high risk of B12 insufficiency and he notes fatigue. He does not have a previous diagnosis of pernicious anemia.   Hyperlipidemia Brandon [name] has hyperlipidemia and is on Lipitor and Welchol. He is working on his diet to improve his cholesterol levels with intensive lifestyle modification including a low saturated fat diet, exercise and weight loss. He denies any chest pain or myalgias.  ASSESSMENT AND PLAN:  Vitamin D deficiency - Plan: Vitamin D, Ergocalciferol, (DRISDOL) 1.25 MG (50000 UT) CAPS capsule  Type 2 diabetes mellitus without complication, without long-term current use of insulin (HCC)  B12  nutritional deficiency  Other hyperlipidemia  Class 3 severe obesity with serious comorbidity and body mass index (BMI) of 40.0 to 44.9 in adult, unspecified obesity type (HCC)  PLAN:  Vitamin D Deficiency Brandon [name] was informed that low vitamin D levels contribute to fatigue and are associated with obesity, breast, and colon cancer. Brandon [name] agrees to continue to take prescription Vit D ,000 IU every week #4 with no refills and will follow up for routine testing of vitamin D, at least 2-3 times per year. He was informed of the risk of over-replacement of vitamin D and agrees to not increase his dose unless he discusses this with [place] first. Labs were ordered today and Brandon [name] agrees to follow up in 2 to 3 weeks as directed.  Diabetes II Brandon [name] has been given extensive diabetes education by myself today including ideal fasting and post-prandial blood glucose readings, individual ideal Hgb A1c goals, and hypoglycemia prevention. We discussed the importance of good blood sugar control to decrease the likelihood of diabetic complications such as nephropathy, neuropathy, limb loss, blindness, coronary artery disease, and death. We discussed the importance of intensive lifestyle modification including diet, exercise, and weight loss as the first line treatment for diabetes. We will check labs today. Brandon [name] agrees to continue his diet and diabetes medications. He will follow up at the agreed upon time in 2 to 3 weeks.  Vitamin B12 Deficiency Brandon [name] will work on increasing B12 rich foods in his diet. B12 supplementation was not prescribed today. We will plan on  checking labs today and he will follow up at the agreed upon time.  Hyperlipidemia Brandon [name] was informed of the American Heart Association Guidelines emphasizing intensive lifestyle modifications as the first line treatment for hyperlipidemia. We discussed many lifestyle modifications today in depth, and Brandon [name] will continue to work on decreasing  saturated fats such as fatty red meat, butter and many fried foods. He will also increase vegetables and lean protein in his diet and continue to work on exercise and weight loss efforts. Labs were ordered today. Brandon [name] agreed to continue his medications and diet. He agrees to follow up as directed.  Obesity Brandon [name] is currently in the action stage of change. As such, his goal is to continue with weight loss efforts. He has agreed to keep a food journal with 1800 to 2300 calories and 120 grams of protein.  Brandon [name] has been instructed to work up to a goal of 150 minutes of combined cardio and strengthening exercise per week for weight loss and overall health benefits. We discussed the following Behavioral Modification Strategies today: emotional eating strategies, ways to avoid boredom eating, keeping healthy foods in the home, better snacking choices, and ways to avoid night time snacking.  Brandon [name] has agreed to follow up with our clinic in 2 to 3 weeks. He was informed of the importance of frequent follow up visits to maximize his success with intensive lifestyle modifications for his multiple health conditions.  ALLERGIES: Allergies  Allergen Reactions  . Amoxicillin Anaphylaxis    Has patient had a PCN reaction causing immediate rash, facial/tongue/throat swelling, SOB or lightheadedness with hypotension: Yes Has patient had a PCN reaction causing severe rash involving mucus membranes or skin necrosis: No Has patient had a PCN reaction that required hospitalization: No Has patient had a PCN reaction occurring within the last 10 years: No If all of the above answers are "NO", then may proceed with Cephalosporin use.   [name] Penicillin G Anaphylaxis    Gets extremely high fevers. Has patient had a PCN reaction causing immediate rash, facial/tongue/throat swelling, SOB or lightheadedness with hypotension: Yes Has patient had a PCN reaction causing severe rash involving mucus membranes or skin  necrosis: No Has patient had a PCN reaction that required hospitalization: No Has patient had a PCN reaction occurring within the last 10 years: No If all of the above answers are "NO", then may proceed with Cephalosporin use.   . Sulfa Antibiotics Other (See Comments)    It will kill him  . Cephalosporins Other (See Comments)    Reaction unspecified.  . Quinolones Other (See Comments)    Reaction unspecified  . Sulfamethoxazole Swelling    MEDICATIONS: Current Outpatient Medications on File Prior to Visit  Medication Sig Dispense Refill  . atorvastatin (LIPITOR) 20 MG tablet TAKE 1 TABLET (20 MG TOTAL) BY MOUTH AT BEDTIME. (Patient taking differently: Take 20 mg by mouth at bedtime. ) 30 tablet 0  . colesevelam (WELCHOL) 625 MG tablet Take 3 tablets (1,875 mg total) by mouth daily with breakfast. 540 tablet 1  . cyclobenzaprine (FLEXERIL) 10 MG tablet Take 1 tablet (10 mg total) by mouth 3 (three) times daily as needed for muscle spasms. 30 tablet 0  . diazepam (VALIUM) 5 MG tablet Take 1 tablet (5 mg total) by mouth every 8 (eight) hours as needed for anxiety. (Patient taking differently: Take 5 mg by mouth at bedtime. ) 20 tablet 0  . docusate sodium (COLACE) 100 MG capsule Take 200 mg by mouth 2 (two)  times daily. Taken at 08:00 and 20:00    . gabapentin (NEURONTIN) 300 MG capsule Take 3 capsules (900 mg total) by mouth 2 (two) times daily. 540 capsule 1  . Insulin Pen Needle (BD PEN NEEDLE NANO U/F) 32G X 4 MM MISC 1 Package by Does not apply route 2 (two) times daily. 100 each 0  . liraglutide (VICTOZA) 18 MG/3ML SOPN Inject 0.1 mLs (0.6 mg total) into the skin every morning. 1 pen 0  . lisinopril-hydrochlorothiazide (PRINZIDE,ZESTORETIC) 10-12.5 MG tablet Take 1 tablet by mouth at bedtime. 0000 90 tablet 1  . metFORMIN (GLUCOPHAGE-XR) 500 MG 24 hr tablet TAKE 2 TABLETS (1,000 MG TOTAL) BY MOUTH 2 (TWO) TIMES DAILY. (Patient taking differently: Take 1,000 mg by mouth 2 (two) times  daily. TAKE 2 TABLETS (1,000 MG TOTAL) BY MOUTH 2 (TWO) TIMES DAILY.) 360 tablet 1  . NON FORMULARY CPAP machine at bedtime & sleep     No current facility-administered medications on file prior to visit.     PAST MEDICAL HISTORY: Past Medical History:  Diagnosis Date  . ADEM (acute disseminated encephalomyelitis)   . Allergy   . Anxiety 2005  . Complication of anesthesia   . Constipation   . Depression 2005  . Diabetes mellitus without complication (HCC)    Type 2  . Difficult intubation    difficult intubating and exbutating "throat Clinched around tube" during an Endoscopy procedure  . Dyspnea    with exertion  . Headache(784.0)   . Hearing loss   . Hyperinsulinemia 07/04/11   "I'm on metformin cause I produce too much insulin"  . Hyperlipidemia   . Hypertension   . Mononucleosis, infectious, with hepatitis 06/2011  . MS (multiple sclerosis) (HCC)    patinet has ADEM not MS per Patient  . Nausea    occasional per history form 08/18/11  . Neuromuscular disorder (HCC)   . Pneumonia 2010  . Sleep apnea    uses CPAP at home  . Stroke (HCC) 2000   TIA  . TIA (transient ischemic attack) 2000   denies residual  . TIA (transient ischemic attack)     PAST SURGICAL HISTORY: Past Surgical History:  Procedure Laterality Date  . ANTERIOR CERVICAL DECOMP/DISCECTOMY FUSION N/A 10/21/2016   Procedure: ANTERIOR CERVICAL DECOMPRESSION/DISCECTOMY FUSION, INTERBODY PROSTHESIS,PLATE CERVICAL FOUR- CERVICAL FIVE, CERVICAL FIVE- CERVICAL SIX;  Surgeon: Tressie StalkerJeffrey Jenkins, MD;  Location: [facility] OR;  Service: Neurosurgery;  Laterality: N/A;  . ANTERIOR CERVICAL DECOMP/DISCECTOMY FUSION N/A 05/19/2018   Procedure: ANTERIOR CERVICAL DECOMPRESSION/DISCECTOMY FUSION, INTERBODY PROSTHESIS,PLATE/SCREWS CERVICAL SIX- CERVICAL SEVEN; EXPLORE FUSION;  Surgeon: Tressie StalkerJenkins, Jeffrey, MD;  [facility];  Service: Neurosurgery;  Laterality: N/A;  . CHOLECYSTECTOMY  08/02/2011   Procedure: LAPAROSCOPIC  CHOLECYSTECTOMY;  Surgeon: Shelly Rubensteinouglas A Blackman, MD;  Location: [facility] OR;  Service: General;;  . COLONOSCOPY  2003  . ESOPHAGOGASTRODUODENOSCOPY  2003  . ESOPHAGOGASTRODUODENOSCOPY  07/28/2011   Procedure: ESOPHAGOGASTRODUODENOSCOPY (EGD);  Surgeon: Yancey FlemingsJohn Perry, MD;  [facility];  Service: Endoscopy;  Laterality: N/A;  . MYRINGOTOMY  1979; 1980   bilaterally  . TONSILLECTOMY AND ADENOIDECTOMY  ~ 1980    SOCIAL HISTORY: Social History   Tobacco Use  . Smoking status: Former Smoker    Packs/day: 3.00    Years: 3.00    Pack years: 9.00    Types: Cigarettes    Last attempt to quit: 08/18/1987    Years since quitting: 31.1  . Smokeless tobacco: Never Used  Substance Use Topics  . Alcohol use: Not  Currently    Alcohol/week: 0.0 standard drinks    Comment: 07/04/11 "glass of wine or spirits once a month"  . Drug use: Not Currently    Types: Marijuana    Comment: have not used in a 1.5 years    FAMILY HISTORY: Family History  Problem Relation Age of Onset  . Coronary artery disease Father   . Stroke Father   . Cancer Father   . High blood pressure Father   . Alcoholism Father   . Diabetes Mother   . Obesity Mother   . Cancer Maternal Grandmother        bone  . Cancer Other        Stomach -Great-great grandmother   ROS: Review of Systems  Constitutional: Positive for weight loss.  Cardiovascular: Negative for chest pain.  Gastrointestinal: Negative for nausea and vomiting.  Musculoskeletal: Negative for myalgias.  Endo/Heme/Allergies:       Negative for hypoglycemia.   PHYSICAL EXAM: Blood pressure 127/81, pulse 78, height 5\' 8"  (1.727 m), weight 287 lb (130.2 kg), SpO2 95 %. Body mass index is 43.64 kg/m. Physical Exam Vitals signs reviewed.  Constitutional:      Appearance: Normal appearance. He is obese.  Cardiovascular:     Rate and Rhythm: Normal rate.  Pulmonary:     Effort: Pulmonary effort is normal.  Musculoskeletal: Normal range of motion.   Skin:    General: Skin is warm and dry.  Neurological:     Mental Status: He is alert and oriented to person, place, and time.  Psychiatric:        Mood and Affect: Mood normal.        Behavior: Behavior normal.    RECENT LABS AND TESTS: BMET    Component Value Date/Time   NA 139 05/13/2018 0957   NA 143 02/16/2018 1037   K 3.2 (L) 05/13/2018 0957   CL 104 05/13/2018 0957   CO2 28 05/13/2018 0957   GLUCOSE 83 05/13/2018 0957   BUN 20 05/13/2018 0957   BUN 14 02/16/2018 1037   CREATININE 0.99 05/13/2018 0957   CREATININE 1.02 08/24/2017 1458   CALCIUM 9.3 05/13/2018 0957   GFRNONAA >60 05/13/2018 0957   GFRNONAA >89 07/18/2015 1656   GFRAA >60 05/13/2018 0957   GFRAA >89 07/18/2015 1656   Lab Results  Component Value Date   HGBA1C 4.9 05/19/2018   HGBA1C 5.0 05/13/2018   HGBA1C 5.5 02/16/2018   HGBA1C 5.7 (H) 09/15/2017   HGBA1C 5.6 08/24/2017   Lab Results  Component Value Date   INSULIN 60.7 (H) 02/16/2018   INSULIN 110.6 (H) 09/15/2017   CBC    Component Value Date/Time   WBC 5.0 05/13/2018 0957   RBC 5.71 05/13/2018 0957   HGB 15.3 05/13/2018 0957   HGB 16.6 09/15/2017 0902   HCT 47.0 05/13/2018 0957   HCT 49.5 09/15/2017 0902   PLT 161 05/13/2018 0957   PLT 158 09/17/2016 1611   MCV 82.3 05/13/2018 0957   MCV 86 09/15/2017 0902   MCH 26.8 05/13/2018 0957   MCHC 32.6 05/13/2018 0957   RDW 13.4 05/13/2018 0957   RDW 14.7 09/15/2017 0902   LYMPHSABS 2.2 09/15/2017 0902   MONOABS 0.5 12/05/2014 1538   EOSABS 0.1 09/15/2017 0902   BASOSABS 0.0 09/15/2017 0902   Iron/TIBC/Ferritin/ %Sat No results found for: IRON, TIBC, FERRITIN, IRONPCTSAT Lipid Panel     Component Value Date/Time   CHOL 116 02/16/2018 1037   TRIG 340 (H) 02/16/2018  1037   HDL 27 (L) 02/16/2018 1037   CHOLHDL 3.4 08/24/2017 1458   VLDL 70 (H) 10/15/2015 1112   LDLCALC 21 02/16/2018 1037   LDLCALC 57 08/24/2017 1458   Hepatic Function Panel     Component Value Date/Time    PROT 6.5 05/13/2018 0957   PROT 6.7 02/16/2018 1037   ALBUMIN 4.0 05/13/2018 0957   ALBUMIN 4.4 02/16/2018 1037   AST 29 05/13/2018 0957   ALT 62 (H) 05/13/2018 0957   ALKPHOS 49 05/13/2018 0957   BILITOT 0.7 05/13/2018 0957   BILITOT 0.4 02/16/2018 1037   BILIDIR 0.1 04/11/2015 1638   IBILI 0.6 04/11/2015 1638      Component Value Date/Time   TSH 2.080 09/15/2017 0902   TSH 3.190 09/17/2016 1611   TSH 2.339 12/05/2014 1538   Results for DRADEN, RUMLEY "[name]" (MRN [id number]) as of 09/26/2018 11:11  Ref. Range 02/16/2018 10:37  Vitamin D, 25-Hydroxy Latest Ref Range: 30.0 - 100.0 ng/mL 28.6 (L)    OBESITY BEHAVIORAL INTERVENTION VISIT  Today's visit was # 17  Starting weight: 305 lbs Starting date: 09/15/17 Today's weight : Weight: 287 lb (130.2 kg)  Today's date: 09/26/2018 Total lbs lost to date: 18 At least 15 minutes were spent on discussing the following behavioral intervention visit.    09/26/2018  Height 5\' 8"  (1.727 m)  Weight 287 lb (130.2 kg)  BMI (Calculated) 43.65  BLOOD PRESSURE - SYSTOLIC 127  BLOOD PRESSURE - DIASTOLIC 81   Body Fat % 35.3 %  Total Body Water (lbs) 126.2 lbs   ASK: We discussed the diagnosis of obesity with Dahlia Byes today and [name] agreed to give [place] permission to discuss obesity behavioral modification therapy today.  ASSESS: Dalan has the diagnosis of obesity and his BMI today is 43.65. Ayinde is in the action stage of change.   ADVISE: Ken was educated on the multiple health risks of obesity as well as the benefit of weight loss to improve his health. He was advised of the need for long term treatment and the importance of lifestyle modifications to improve his current health and to decrease his risk of future health problems.  AGREE: Multiple dietary modification options and treatment options were discussed and Kelso agreed to follow the recommendations documented in the above note.  ARRANGE: Jamone was  educated on the importance of frequent visits to treat obesity as outlined per CMS and USPSTF guidelines and agreed to schedule his next follow up appointment today.  IKirke Corin, CMA, am acting as transcriptionist for Wilder Glade, MD  I have reviewed the above documentation for accuracy and completeness, and I agree with the above. -Quillian Quince, MD

## 2018-09-27 ENCOUNTER — Encounter (INDEPENDENT_AMBULATORY_CARE_PROVIDER_SITE_OTHER): Payer: Self-pay

## 2018-09-27 LAB — CBC WITH DIFFERENTIAL
Basophils Absolute: 0 10*3/uL (ref 0.0–0.2)
Basos: 1 %
EOS (ABSOLUTE): 0.1 10*3/uL (ref 0.0–0.4)
Eos: 2 %
Hematocrit: 55.8 % — ABNORMAL HIGH (ref 37.5–51.0)
Hemoglobin: 18.2 g/dL — ABNORMAL HIGH (ref 13.0–17.7)
Immature Grans (Abs): 0 10*3/uL (ref 0.0–0.1)
Immature Granulocytes: 0 %
Lymphocytes Absolute: 2.2 10*3/uL (ref 0.7–3.1)
Lymphs: 36 %
MCH: 26.5 pg — ABNORMAL LOW (ref 26.6–33.0)
MCHC: 32.6 g/dL (ref 31.5–35.7)
MCV: 81 fL (ref 79–97)
Monocytes Absolute: 0.6 10*3/uL (ref 0.1–0.9)
Monocytes: 9 %
Neutrophils Absolute: 3.2 10*3/uL (ref 1.4–7.0)
Neutrophils: 52 %
RBC: 6.88 x10E6/uL — ABNORMAL HIGH (ref 4.14–5.80)
RDW: 15.3 % (ref 11.6–15.4)
WBC: 6.1 10*3/uL (ref 3.4–10.8)

## 2018-09-27 LAB — COMPREHENSIVE METABOLIC PANEL
ALT: 93 IU/L — ABNORMAL HIGH (ref 0–44)
AST: 38 IU/L (ref 0–40)
Albumin/Globulin Ratio: 2 (ref 1.2–2.2)
Albumin: 4.9 g/dL (ref 4.0–5.0)
Alkaline Phosphatase: 70 IU/L (ref 39–117)
BUN/Creatinine Ratio: 16 (ref 9–20)
BUN: 17 mg/dL (ref 6–24)
Bilirubin Total: 0.6 mg/dL (ref 0.0–1.2)
CO2: 26 mmol/L (ref 20–29)
Calcium: 10.2 mg/dL (ref 8.7–10.2)
Chloride: 98 mmol/L (ref 96–106)
Creatinine, Ser: 1.09 mg/dL (ref 0.76–1.27)
GFR calc Af Amer: 93 mL/min/{1.73_m2} (ref >59)
GFR calc non Af Amer: 80 mL/min/{1.73_m2} (ref >59)
Globulin, Total: 2.5 g/dL (ref 1.5–4.5)
Glucose: 85 mg/dL (ref 65–99)
Potassium: 4 mmol/L (ref 3.5–5.2)
Sodium: 144 mmol/L (ref 134–144)
Total Protein: 7.4 g/dL (ref 6.0–8.5)

## 2018-09-27 LAB — T3: T3, Total: 113 ng/dL (ref 71–180)

## 2018-09-27 LAB — TSH: TSH: 2.2 u[IU]/mL (ref 0.450–4.500)

## 2018-09-27 LAB — LIPID PANEL WITH LDL/HDL RATIO
Cholesterol, Total: 121 mg/dL (ref 100–199)
HDL: 32 mg/dL — ABNORMAL LOW (ref >39)
LDL Calculated: 39 mg/dL (ref 0–99)
LDl/HDL Ratio: 1.2 ratio (ref 0.0–3.6)
Triglycerides: 250 mg/dL — ABNORMAL HIGH (ref 0–149)
VLDL Cholesterol Cal: 50 mg/dL — ABNORMAL HIGH (ref 5–40)

## 2018-09-27 LAB — HEMOGLOBIN A1C
Est. average glucose Bld gHb Est-mCnc: 114 mg/dL
Hgb A1c MFr Bld: 5.6 % (ref 4.8–5.6)

## 2018-09-27 LAB — VITAMIN B12: Vitamin B-12: 502 pg/mL (ref 232–1245)

## 2018-09-27 LAB — T4, FREE: Free T4: 1.16 ng/dL (ref 0.82–1.77)

## 2018-09-27 LAB — FOLATE: Folate: 19.3 ng/mL (ref >3.0)

## 2018-09-27 LAB — VITAMIN D 25 HYDROXY (VIT D DEFICIENCY, FRACTURES): Vit D, 25-Hydroxy: 33.3 ng/mL (ref 30.0–100.0)

## 2018-09-27 LAB — INSULIN, RANDOM: INSULIN: 76.8 u[IU]/mL — ABNORMAL HIGH (ref 2.6–24.9)

## 2018-10-11 ENCOUNTER — Ambulatory Visit (INDEPENDENT_AMBULATORY_CARE_PROVIDER_SITE_OTHER): Payer: Medicare Other | Admitting: Family Medicine

## 2018-10-13 ENCOUNTER — Other Ambulatory Visit: Payer: Self-pay

## 2018-10-13 ENCOUNTER — Encounter (INDEPENDENT_AMBULATORY_CARE_PROVIDER_SITE_OTHER): Payer: Self-pay | Admitting: Family Medicine

## 2018-10-13 ENCOUNTER — Ambulatory Visit (INDEPENDENT_AMBULATORY_CARE_PROVIDER_SITE_OTHER): Payer: 59 | Admitting: Family Medicine

## 2018-10-13 DIAGNOSIS — E559 Vitamin D deficiency, unspecified: Secondary | ICD-10-CM

## 2018-10-13 DIAGNOSIS — E119 Type 2 diabetes mellitus without complications: Secondary | ICD-10-CM

## 2018-10-13 DIAGNOSIS — Z6841 Body Mass Index (BMI) 40.0 and over, adult: Secondary | ICD-10-CM | POA: Diagnosis not present

## 2018-10-13 MED ORDER — VITAMIN D (ERGOCALCIFEROL) 1.25 MG (50000 UNIT) PO CAPS: 50000.0000 [IU] | ORAL_CAPSULE | ORAL | 0 refills | Status: DC

## 2018-10-13 MED ORDER — LIRAGLUTIDE 18 MG/3ML ~~LOC~~ SOPN: 0.6000 mg | PEN_INJECTOR | SUBCUTANEOUS | 0 refills | Status: DC

## 2018-10-17 NOTE — Progress Notes (Signed)
Office: [phone]  /  Fax: [phone] TeleHealth Visit:  Brandon [name] has verbally consented to this TeleHealth visit today. The patient is located at home, the provider is located at the [facility] office. The participants in this visit include the listed provider and patient. Brandon [name] was unable to use realtime audiovisual technology today and the telehealth visit was conducted via telephone.   HPI:   Chief Complaint: OBESITY Brandon [name] is here to discuss his progress with his obesity treatment plan. He is on the Category 4 plan and is following his eating plan approximately 50 % of the time. He states he is exercising 0 minutes 0 times per week. Brandon [name] was unable to skype. He thinks he has gained 5 lbs in the last 3 weeks. He has not been able to exercise and notes some increased stress eating. He has been trying to isolate due to his health issues, so he has not been able to grocery shop as often.  We were unable to weigh the patient today for this TeleHealth visit. He is unsure if he has gained or lost weight since his last visit. He has lost 18 lbs since starting treatment with [place].  Vitamin D Deficiency Brandon [name] has a diagnosis of vitamin D deficiency. He is currently taking prescription Vit D, but level is not yet at goal. She denies nausea, vomiting or muscle weakness.  Diabetes II Brandon [name] has a diagnosis of diabetes type II. Brandon [name] states he is not checking BGs at home. His A1c is controlled but has increased with his weight gain. He also has forgotten some doses of Victoza, but states he has done better. He has been working on intensive lifestyle modifications including diet, exercise, and weight loss to help control his blood glucose levels.  ASSESSMENT AND PLAN:  Vitamin D deficiency - Plan: Vitamin D, Ergocalciferol, (DRISDOL) 1.25 MG (50000 UT) CAPS capsule  Type 2 diabetes mellitus without complication, without long-term current use of insulin (HCC) -  Plan: liraglutide (VICTOZA) 18 MG/3ML SOPN  Class 3 severe obesity with serious comorbidity and body mass index (BMI) of 40.0 to 44.9 in adult, unspecified obesity type (HCC)  PLAN:  Vitamin D Deficiency Brandon [name] was informed that low vitamin D levels contributes to fatigue and are associated with obesity, breast, and colon cancer. Brandon [name] agrees to continue taking prescription Vit D @50 ,000 IU every week #4 and we will refill for 1 month. She will follow up for routine testing of vitamin D, at least 2-3 times per year. He was informed of the risk of over-replacement of vitamin D and agrees to not increase his dose unless he discusses this with [place] first. Brandon [name] agrees to follow up with our clinic in 3 weeks.  Diabetes II Brandon [name] has been given extensive diabetes education by myself today including ideal fasting and post-prandial blood glucose readings, individual ideal Hgb A1c goals and hypoglycemia prevention. We discussed the importance of good blood sugar control to decrease the likelihood of diabetic complications such as nephropathy, neuropathy, limb loss, blindness, coronary artery disease, and death. We discussed the importance of intensive lifestyle modification including diet, exercise and weight loss as the first line treatment for diabetes. Brandon [name] agrees to continue Victoza 0.6 mg SubQ daily #1 pen and we will refill for 1 month. Brandon [name] agrees to follow up with our clinic in 3 weeks.  Obesity Brandon [name] is currently in the action stage of change. As such, his goal is to continue with weight loss efforts He has agreed  to follow the Category 4 plan + 200 calories Brandon [name] has been instructed to work up to a goal of 150 minutes of combined cardio and strengthening exercise per week for weight loss and overall health benefits. We discussed the following Behavioral Modification Strategies today: work on meal planning and easy cooking plans, emotional eating strategies, keeping healthy foods in  the home, better snacking choices, and planning for success   Brandon [name] has agreed to follow up with our clinic in 3 weeks. He was informed of the importance of frequent follow up visits to maximize his success with intensive lifestyle modifications for his multiple health conditions.  ALLERGIES: Allergies  Allergen Reactions  . Amoxicillin Anaphylaxis    Has patient had a PCN reaction causing immediate rash, facial/tongue/throat swelling, SOB or lightheadedness with hypotension: Yes Has patient had a PCN reaction causing severe rash involving mucus membranes or skin necrosis: No Has patient had a PCN reaction that required hospitalization: No Has patient had a PCN reaction occurring within the last 10 years: No If all of the above answers are "NO", then may proceed with Cephalosporin use.   [name]. Penicillin G Anaphylaxis    Gets extremely high fevers. Has patient had a PCN reaction causing immediate rash, facial/tongue/throat swelling, SOB or lightheadedness with hypotension: Yes Has patient had a PCN reaction causing severe rash involving mucus membranes or skin necrosis: No Has patient had a PCN reaction that required hospitalization: No Has patient had a PCN reaction occurring within the last 10 years: No If all of the above answers are "NO", then may proceed with Cephalosporin use.   . Sulfa Antibiotics Other (See Comments)    It will kill him  . Cephalosporins Other (See Comments)    Reaction unspecified.  . Quinolones Other (See Comments)    Reaction unspecified  . Sulfamethoxazole Swelling    MEDICATIONS: Current Outpatient Medications on File Prior to Visit  Medication Sig Dispense Refill  . atorvastatin (LIPITOR) 20 MG tablet TAKE 1 TABLET (20 MG TOTAL) BY MOUTH AT BEDTIME. (Patient taking differently: Take 20 mg by mouth at bedtime. ) 30 tablet 0  . colesevelam (WELCHOL) 625 MG tablet Take 3 tablets (1,875 mg total) by mouth daily with breakfast. 540 tablet 1  .  cyclobenzaprine (FLEXERIL) 10 MG tablet Take 1 tablet (10 mg total) by mouth 3 (three) times daily as needed for muscle spasms. 30 tablet 0  . diazepam (VALIUM) 5 MG tablet Take 1 tablet (5 mg total) by mouth every 8 (eight) hours as needed for anxiety. (Patient taking differently: Take 5 mg by mouth at bedtime. ) 20 tablet 0  . docusate sodium (COLACE) 100 MG capsule Take 200 mg by mouth 2 (two) times daily. Taken at 08:00 and 20:00    . gabapentin (NEURONTIN) 300 MG capsule Take 3 capsules (900 mg total) by mouth 2 (two) times daily. 540 capsule 1  . Insulin Pen Needle (BD PEN NEEDLE NANO U/F) 32G X 4 MM MISC 1 Package by Does not apply route 2 (two) times daily. 100 each 0  . lisinopril-hydrochlorothiazide (PRINZIDE,ZESTORETIC) 10-12.5 MG tablet Take 1 tablet by mouth at bedtime. 0000 90 tablet 1  . metFORMIN (GLUCOPHAGE-XR) 500 MG 24 hr tablet TAKE 2 TABLETS (1,000 MG TOTAL) BY MOUTH 2 (TWO) TIMES DAILY. (Patient taking differently: Take 1,000 mg by mouth 2 (two) times daily. TAKE 2 TABLETS (1,000 MG TOTAL) BY MOUTH 2 (TWO) TIMES DAILY.) 360 tablet 1  . NON FORMULARY CPAP machine at bedtime & sleep  No current facility-administered medications on file prior to visit.     PAST MEDICAL HISTORY: Past Medical History:  Diagnosis Date  . ADEM (acute disseminated encephalomyelitis)   . Allergy   . Anxiety 2005  . Complication of anesthesia   . Constipation   . Depression 2005  . Diabetes mellitus without complication (HCC)    Type 2  . Difficult intubation    difficult intubating and exbutating "throat Clinched around tube" during an Endoscopy procedure  . Dyspnea    with exertion  . Headache(784.0)   . Hearing loss   . Hyperinsulinemia 07/04/11   "I'm on metformin cause I produce too much insulin"  . Hyperlipidemia   . Hypertension   . Mononucleosis, infectious, with hepatitis 06/2011  . MS (multiple sclerosis) (HCC)    patinet has ADEM not MS per Patient  . Nausea     occasional per history form 08/18/11  . Neuromuscular disorder (HCC)   . Pneumonia 2010  . Sleep apnea    uses CPAP at home  . Stroke (HCC) 2000   TIA  . TIA (transient ischemic attack) 2000   denies residual  . TIA (transient ischemic attack)     PAST SURGICAL HISTORY: Past Surgical History:  Procedure Laterality Date  . ANTERIOR CERVICAL DECOMP/DISCECTOMY FUSION N/A 10/21/2016   Procedure: ANTERIOR CERVICAL DECOMPRESSION/DISCECTOMY FUSION, INTERBODY PROSTHESIS,PLATE CERVICAL FOUR- CERVICAL FIVE, CERVICAL FIVE- CERVICAL SIX;  Surgeon: Tressie Stalker, MD;  Location: [facility] OR;  Service: Neurosurgery;  Laterality: N/A;  . ANTERIOR CERVICAL DECOMP/DISCECTOMY FUSION N/A 05/19/2018   Procedure: ANTERIOR CERVICAL DECOMPRESSION/DISCECTOMY FUSION, INTERBODY PROSTHESIS,PLATE/SCREWS CERVICAL SIX- CERVICAL SEVEN; EXPLORE FUSION;  Surgeon: Tressie Stalker, MD;  Location: [facility] OR;  Service: Neurosurgery;  Laterality: N/A;  . CHOLECYSTECTOMY  08/02/2011   Procedure: LAPAROSCOPIC CHOLECYSTECTOMY;  Surgeon: Shelly Rubenstein, MD;  Location: [facility] OR;  Service: General;;  . COLONOSCOPY  2003  . ESOPHAGOGASTRODUODENOSCOPY  2003  . ESOPHAGOGASTRODUODENOSCOPY  07/28/2011   Procedure: ESOPHAGOGASTRODUODENOSCOPY (EGD);  Surgeon: [name], MD;  Location: [facility] ENDOSCOPY;  Service: Endoscopy;  Laterality: N/A;  . MYRINGOTOMY  1979; 1980   bilaterally  . TONSILLECTOMY AND ADENOIDECTOMY  ~ 1980    SOCIAL HISTORY: Social History   Tobacco Use  . Smoking status: Former Smoker    Packs/day: 3.00    Years: 3.00    Pack years: 9.00    Types: Cigarettes    Last attempt to quit: 08/18/1987    Years since quitting: 31.1  . Smokeless tobacco: Never Used  Substance Use Topics  . Alcohol use: Not Currently    Alcohol/week: 0.0 standard drinks    Comment: 07/04/11 "glass of wine or spirits once a month"  . Drug use: Not Currently    Types: Marijuana    Comment: have not used in a 1.5 years    FAMILY HISTORY:  Family History  Problem Relation Age of Onset  . Coronary artery disease Father   . Stroke Father   . Cancer Father   . High blood pressure Father   . Alcoholism Father   . Diabetes Mother   . Obesity Mother   . Cancer Maternal Grandmother        bone  . Cancer Other        Stomach -Great-great grandmother    ROS: Review of Systems  Constitutional: Negative for weight loss.  Gastrointestinal: Negative for nausea and vomiting.  Musculoskeletal:       Negative muscle weakness    PHYSICAL EXAM: Pt in no  acute distress  RECENT LABS AND TESTS: BMET    Component Value Date/Time   NA 144 09/26/2018 1126   K 4.0 09/26/2018 1126   CL 98 09/26/2018 1126   CO2 26 09/26/2018 1126   GLUCOSE 85 09/26/2018 1126   GLUCOSE 83 05/13/2018 0957   BUN 17 09/26/2018 1126   CREATININE 1.09 09/26/2018 1126   CREATININE 1.02 08/24/2017 1458   CALCIUM 10.2 09/26/2018 1126   GFRNONAA 80 09/26/2018 1126   GFRNONAA >89 07/18/2015 1656   GFRAA 93 09/26/2018 1126   GFRAA >89 07/18/2015 1656   Lab Results  Component Value Date   HGBA1C 5.6 09/26/2018   HGBA1C 4.9 05/19/2018   HGBA1C 5.0 05/13/2018   HGBA1C 5.5 02/16/2018   HGBA1C 5.7 (H) 09/15/2017   Lab Results  Component Value Date   INSULIN 76.8 (H) 09/26/2018   INSULIN 60.7 (H) 02/16/2018   INSULIN 110.6 (H) 09/15/2017   CBC    Component Value Date/Time   WBC 6.1 09/26/2018 1126   WBC 5.0 05/13/2018 0957   RBC 6.88 (H) 09/26/2018 1126   RBC 5.71 05/13/2018 0957   HGB 18.2 (H) 09/26/2018 1126   HCT 55.8 (H) 09/26/2018 1126   PLT 161 05/13/2018 0957   PLT 158 09/17/2016 1611   MCV 81 09/26/2018 1126   MCH 26.5 (L) 09/26/2018 1126   MCH 26.8 05/13/2018 0957   MCHC 32.6 09/26/2018 1126   MCHC 32.6 05/13/2018 0957   RDW 15.3 09/26/2018 1126   LYMPHSABS 2.2 09/26/2018 1126   MONOABS 0.5 12/05/2014 1538   EOSABS 0.1 09/26/2018 1126   BASOSABS 0.0 09/26/2018 1126   Iron/TIBC/Ferritin/ %Sat No results found for: IRON,  TIBC, FERRITIN, IRONPCTSAT Lipid Panel     Component Value Date/Time   CHOL 121 09/26/2018 1126   TRIG 250 (H) 09/26/2018 1126   HDL 32 (L) 09/26/2018 1126   CHOLHDL 3.4 08/24/2017 1458   VLDL 70 (H) 10/15/2015 1112   LDLCALC 39 09/26/2018 1126   LDLCALC 57 08/24/2017 1458   Hepatic Function Panel     Component Value Date/Time   PROT 7.4 09/26/2018 1126   ALBUMIN 4.9 09/26/2018 1126   AST 38 09/26/2018 1126   ALT 93 (H) 09/26/2018 1126   ALKPHOS 70 09/26/2018 1126   BILITOT 0.6 09/26/2018 1126   BILIDIR 0.1 04/11/2015 1638   IBILI 0.6 04/11/2015 1638      Component Value Date/Time   TSH 2.200 09/26/2018 1126   TSH 2.080 09/15/2017 0902   TSH 3.190 09/17/2016 1611      I, Burt Knack, am acting as transcriptionist for Quillian Quince, MD I have reviewed the above documentation for accuracy and completeness, and I agree with the above. -Quillian Quince, MD

## 2018-10-25 DIAGNOSIS — M5020 Other cervical disc displacement, unspecified cervical region: Secondary | ICD-10-CM | POA: Diagnosis not present

## 2018-11-02 ENCOUNTER — Other Ambulatory Visit: Payer: Self-pay

## 2018-11-02 ENCOUNTER — Encounter (INDEPENDENT_AMBULATORY_CARE_PROVIDER_SITE_OTHER): Payer: Self-pay | Admitting: Family Medicine

## 2018-11-02 ENCOUNTER — Ambulatory Visit (INDEPENDENT_AMBULATORY_CARE_PROVIDER_SITE_OTHER): Payer: 59 | Admitting: Family Medicine

## 2018-11-02 DIAGNOSIS — Z6841 Body Mass Index (BMI) 40.0 and over, adult: Secondary | ICD-10-CM | POA: Diagnosis not present

## 2018-11-02 DIAGNOSIS — R5383 Other fatigue: Secondary | ICD-10-CM

## 2018-11-02 NOTE — Progress Notes (Signed)
Office: [phone]  /  Fax: [phone] TeleHealth Visit:  Brandon [name] has verbally consented to this TeleHealth visit today. The patient is located at home, the provider is located at the [facility] office. The participants in this visit include the listed provider, patient, and Darl Pikes. The visit was conducted today via [email or web address].  HPI:   Chief Complaint: OBESITY Brandon [name] is here to discuss his progress with his obesity treatment plan. He is on the Category 4 plan + 500 calories and is following his eating plan approximately 30 % of the time. He states he is exercising 0 minutes 0 times per week. Brandon [name] feels that he has gained approximately 4 pounds over the last 6 weeks since our last visit in the office. He has not been following his plan closely and he hasn't been exercising much. His motivation to change has decreased.  We were unable to weigh the patient today for this TeleHealth visit. He feels as if he has gained weight since his last visit. He has lost 18 lbs since starting treatment with [place].  Fatigue Brandon [name] feels his fatigue has been related to the increased cravings due to COVID19. He has not been exercising, but feels that he is ready to start.  ASSESSMENT AND PLAN:  Other fatigue  Class 3 severe obesity with serious comorbidity and body mass index (BMI) of 40.0 to 44.9 in adult, unspecified obesity type (HCC)  PLAN:  Fatigue Brandon [name] was informed that his fatigue may be related to obesity, depression or many other causes. [name] has agreed to start walking in nice days outside, maybe 15 minutes to start. He will continue to work on diet, exercise, and weight loss to help with fatigue. Proper sleep hygiene was discussed including the need for 7-8 hours of quality sleep each night.  I spent > than 50% of the 15 minute visit on counseling as documented in the note.  Obesity Brandon [name] is currently in the action stage of change. As such, his goal is to  maintain weight for now until life is back closer to normal. He has agreed to follow the Category 4 plan + 500 calories. Brandon [name] has been instructed to start walking 15 minutes on most days. We discussed the following Behavioral Modification Strategies today: work on meal planning and easy cooking plans and emotional eating strategies. We discussed various medication options to help Brandon [name] with his weight loss efforts and we both agreed to work on Designer, multimedia for now.  Brandon [name] has agreed to follow up with our clinic in 2 weeks. He was informed of the importance of frequent follow up visits to maximize his success with intensive lifestyle modifications for his multiple health conditions.  ALLERGIES: Allergies  Allergen Reactions  . Amoxicillin Anaphylaxis    Has patient had a PCN reaction causing immediate rash, facial/tongue/throat swelling, SOB or lightheadedness with hypotension: Yes Has patient had a PCN reaction causing severe rash involving mucus membranes or skin necrosis: No Has patient had a PCN reaction that required hospitalization: No Has patient had a PCN reaction occurring within the last 10 years: No If all of the above answers are "NO", then may proceed with Cephalosporin use.   [name] Penicillin G Anaphylaxis    Gets extremely high fevers. Has patient had a PCN reaction causing immediate rash, facial/tongue/throat swelling, SOB or lightheadedness with hypotension: Yes Has patient had a PCN reaction causing severe rash involving mucus membranes or skin necrosis: No Has patient had a PCN  reaction that required hospitalization: No Has patient had a PCN reaction occurring within the last 10 years: No If all of the above answers are "NO", then may proceed with Cephalosporin use.   . Sulfa Antibiotics Other (See Comments)    It will kill him  . Cephalosporins Other (See Comments)    Reaction unspecified.  . Quinolones Other (See Comments)    Reaction  unspecified  . Sulfamethoxazole Swelling    MEDICATIONS: Current Outpatient Medications on File Prior to Visit  Medication Sig Dispense Refill  . atorvastatin (LIPITOR) 20 MG tablet TAKE 1 TABLET (20 MG TOTAL) BY MOUTH AT BEDTIME. (Patient taking differently: Take 20 mg by mouth at bedtime. ) 30 tablet 0  . colesevelam (WELCHOL) 625 MG tablet Take 3 tablets (1,875 mg total) by mouth daily with breakfast. 540 tablet 1  . cyclobenzaprine (FLEXERIL) 10 MG tablet Take 1 tablet (10 mg total) by mouth 3 (three) times daily as needed for muscle spasms. 30 tablet 0  . diazepam (VALIUM) 5 MG tablet Take 1 tablet (5 mg total) by mouth every 8 (eight) hours as needed for anxiety. (Patient taking differently: Take 5 mg by mouth at bedtime. ) 20 tablet 0  . docusate sodium (COLACE) 100 MG capsule Take 200 mg by mouth 2 (two) times daily. Taken at 08:00 and 20:00    . gabapentin (NEURONTIN) 300 MG capsule Take 3 capsules (900 mg total) by mouth 2 (two) times daily. 540 capsule 1  . Insulin Pen Needle (BD PEN NEEDLE NANO U/F) 32G X 4 MM MISC 1 Package by Does not apply route 2 (two) times daily. 100 each 0  . liraglutide (VICTOZA) 18 MG/3ML SOPN Inject 0.1 mLs (0.6 mg total) into the skin every morning. 1 pen 0  . lisinopril-hydrochlorothiazide (PRINZIDE,ZESTORETIC) 10-12.5 MG tablet Take 1 tablet by mouth at bedtime. 0000 90 tablet 1  . metFORMIN (GLUCOPHAGE-XR) 500 MG 24 hr tablet TAKE 2 TABLETS (1,000 MG TOTAL) BY MOUTH 2 (TWO) TIMES DAILY. (Patient taking differently: Take 1,000 mg by mouth 2 (two) times daily. TAKE 2 TABLETS (1,000 MG TOTAL) BY MOUTH 2 (TWO) TIMES DAILY.) 360 tablet 1  . NON FORMULARY CPAP machine at bedtime & sleep    . Vitamin D, Ergocalciferol, (DRISDOL) 1.25 MG (50000 UT) CAPS capsule Take 1 capsule (50,000 Units total) by mouth every 7 (seven) days. 4 capsule 0   No current facility-administered medications on file prior to visit.     PAST MEDICAL HISTORY: Past Medical History:   Diagnosis Date  . ADEM (acute disseminated encephalomyelitis)   . Allergy   . Anxiety 2005  . Complication of anesthesia   . Constipation   . Depression 2005  . Diabetes mellitus without complication (HCC)    Type 2  . Difficult intubation    difficult intubating and exbutating "throat Clinched around tube" during an Endoscopy procedure  . Dyspnea    with exertion  . Headache(784.0)   . Hearing loss   . Hyperinsulinemia 07/04/11   "I'm on metformin cause I produce too much insulin"  . Hyperlipidemia   . Hypertension   . Mononucleosis, infectious, with hepatitis 06/2011  . MS (multiple sclerosis) (HCC)    patinet has ADEM not MS per Patient  . Nausea    occasional per history form 08/18/11  . Neuromuscular disorder (HCC)   . Pneumonia 2010  . Sleep apnea    uses CPAP at home  . Stroke (HCC) 2000   TIA  . TIA (transient  ischemic attack) 2000   denies residual  . TIA (transient ischemic attack)     PAST SURGICAL HISTORY: Past Surgical History:  Procedure Laterality Date  . ANTERIOR CERVICAL DECOMP/DISCECTOMY FUSION N/A 10/21/2016   Procedure: ANTERIOR CERVICAL DECOMPRESSION/DISCECTOMY FUSION, INTERBODY PROSTHESIS,PLATE CERVICAL FOUR- CERVICAL FIVE, CERVICAL FIVE- CERVICAL SIX;  Surgeon: Tressie Stalker, MD;  Location: [facility] OR;  Service: Neurosurgery;  Laterality: N/A;  . ANTERIOR CERVICAL DECOMP/DISCECTOMY FUSION N/A 05/19/2018   Procedure: ANTERIOR CERVICAL DECOMPRESSION/DISCECTOMY FUSION, INTERBODY PROSTHESIS,PLATE/SCREWS CERVICAL SIX- CERVICAL SEVEN; EXPLORE FUSION;  Surgeon: Tressie Stalker, MD;  Location: [facility] OR;  Service: Neurosurgery;  Laterality: N/A;  . CHOLECYSTECTOMY  08/02/2011   Procedure: LAPAROSCOPIC CHOLECYSTECTOMY;  Surgeon: Shelly Rubenstein, MD;  Location: [facility] OR;  Service: General;;  . COLONOSCOPY  2003  . ESOPHAGOGASTRODUODENOSCOPY  2003  . ESOPHAGOGASTRODUODENOSCOPY  07/28/2011   Procedure: ESOPHAGOGASTRODUODENOSCOPY (EGD);  Surgeon: [name], MD;   Location: [facility] ENDOSCOPY;  Service: Endoscopy;  Laterality: N/A;  . MYRINGOTOMY  1979; 1980   bilaterally  . TONSILLECTOMY AND ADENOIDECTOMY  ~ 1980    SOCIAL HISTORY: Social History   Tobacco Use  . Smoking status: Former Smoker    Packs/day: 3.00    Years: 3.00    Pack years: 9.00    Types: Cigarettes    Last attempt to quit: 08/18/1987    Years since quitting: 31.2  . Smokeless tobacco: Never Used  Substance Use Topics  . Alcohol use: Not Currently    Alcohol/week: 0.0 standard drinks    Comment: 07/04/11 "glass of wine or spirits once a month"  . Drug use: Not Currently    Types: Marijuana    Comment: have not used in a 1.5 years    FAMILY HISTORY: Family History  Problem Relation Age of Onset  . Coronary artery disease Father   . Stroke Father   . Cancer Father   . High blood pressure Father   . Alcoholism Father   . Diabetes Mother   . Obesity Mother   . Cancer Maternal Grandmother        bone  . Cancer Other        Stomach -Great-great grandmother    ROS: Review of Systems  Constitutional: Positive for malaise/fatigue.    PHYSICAL EXAM: Pt in no acute distress  RECENT LABS AND TESTS: BMET    Component Value Date/Time   NA 144 09/26/2018 1126   K 4.0 09/26/2018 1126   CL 98 09/26/2018 1126   CO2 26 09/26/2018 1126   GLUCOSE 85 09/26/2018 1126   GLUCOSE 83 05/13/2018 0957   BUN 17 09/26/2018 1126   CREATININE 1.09 09/26/2018 1126   CREATININE 1.02 08/24/2017 1458   CALCIUM 10.2 09/26/2018 1126   GFRNONAA 80 09/26/2018 1126   GFRNONAA >89 07/18/2015 1656   GFRAA 93 09/26/2018 1126   GFRAA >89 07/18/2015 1656   Lab Results  Component Value Date   HGBA1C 5.6 09/26/2018   HGBA1C 4.9 05/19/2018   HGBA1C 5.0 05/13/2018   HGBA1C 5.5 02/16/2018   HGBA1C 5.7 (H) 09/15/2017   Lab Results  Component Value Date   INSULIN 76.8 (H) 09/26/2018   INSULIN 60.7 (H) 02/16/2018   INSULIN 110.6 (H) 09/15/2017   CBC    Component Value Date/Time   WBC  6.1 09/26/2018 1126   WBC 5.0 05/13/2018 0957   RBC 6.88 (H) 09/26/2018 1126   RBC 5.71 05/13/2018 0957   HGB 18.2 (H) 09/26/2018 1126   HCT 55.8 (H) 09/26/2018 1126  PLT 161 05/13/2018 0957   PLT 158 09/17/2016 1611   MCV 81 09/26/2018 1126   MCH 26.5 (L) 09/26/2018 1126   MCH 26.8 05/13/2018 0957   MCHC 32.6 09/26/2018 1126   MCHC 32.6 05/13/2018 0957   RDW 15.3 09/26/2018 1126   LYMPHSABS 2.2 09/26/2018 1126   MONOABS 0.5 12/05/2014 1538   EOSABS 0.1 09/26/2018 1126   BASOSABS 0.0 09/26/2018 1126   Iron/TIBC/Ferritin/ %Sat No results found for: IRON, TIBC, FERRITIN, IRONPCTSAT Lipid Panel     Component Value Date/Time   CHOL 121 09/26/2018 1126   TRIG 250 (H) 09/26/2018 1126   HDL 32 (L) 09/26/2018 1126   CHOLHDL 3.4 08/24/2017 1458   VLDL 70 (H) 10/15/2015 1112   LDLCALC 39 09/26/2018 1126   LDLCALC 57 08/24/2017 1458   Hepatic Function Panel     Component Value Date/Time   PROT 7.4 09/26/2018 1126   ALBUMIN 4.9 09/26/2018 1126   AST 38 09/26/2018 1126   ALT 93 (H) 09/26/2018 1126   ALKPHOS 70 09/26/2018 1126   BILITOT 0.6 09/26/2018 1126   BILIDIR 0.1 04/11/2015 1638   IBILI 0.6 04/11/2015 1638      Component Value Date/Time   TSH 2.200 09/26/2018 1126   TSH 2.080 09/15/2017 0902   TSH 3.190 09/17/2016 1611   Results for SHAWNN, BOUILLON "[facility]" (MRN [id number]) as of 11/02/2018 15:26  Ref. Range 09/26/2018 11:26  Vitamin D, 25-Hydroxy Latest Ref Range: 30.0 - 100.0 ng/mL 33.3     I, Kirke Corin, CMA, am acting as transcriptionist for Wilder Glade, MD I have reviewed the above documentation for accuracy and completeness, and I agree with the above. -Quillian Quince, MD

## 2018-11-16 ENCOUNTER — Encounter (INDEPENDENT_AMBULATORY_CARE_PROVIDER_SITE_OTHER): Payer: Self-pay | Admitting: Family Medicine

## 2018-11-16 ENCOUNTER — Ambulatory Visit (INDEPENDENT_AMBULATORY_CARE_PROVIDER_SITE_OTHER): Payer: 59 | Admitting: Family Medicine

## 2018-11-16 ENCOUNTER — Other Ambulatory Visit: Payer: Self-pay

## 2018-11-16 DIAGNOSIS — E559 Vitamin D deficiency, unspecified: Secondary | ICD-10-CM | POA: Diagnosis not present

## 2018-11-16 DIAGNOSIS — R5383 Other fatigue: Secondary | ICD-10-CM

## 2018-11-16 DIAGNOSIS — Z6841 Body Mass Index (BMI) 40.0 and over, adult: Secondary | ICD-10-CM | POA: Diagnosis not present

## 2018-11-16 MED ORDER — VITAMIN D (ERGOCALCIFEROL) 1.25 MG (50000 UNIT) PO CAPS: 50000.0000 [IU] | ORAL_CAPSULE | ORAL | 0 refills | Status: DC

## 2018-11-17 NOTE — Progress Notes (Signed)
Office: [phone]  /  Fax: [phone] TeleHealth Visit:  Brandon [name] has verbally consented to this TeleHealth visit today. The patient is located at home, the provider is located at the [facility] office. The participants in this visit include the listed provider and patient. The visit was conducted today via [email or web address].  HPI:   Chief Complaint: OBESITY Brandon [name] is here to discuss his progress with his obesity treatment plan. He is on the  follow the Category 4 plan + 500 calories and is following his eating plan approximately 20 % of the time. He states he is exercising 0 minutes 0 times per week. Brandon [name] has struggled with emotional eating, especially during the COVID19 pandemic, and continues to gain weight. He is up about 13 pounds in the last 2 months and has not been following his plan closely.  We were unable to weigh the patient today for this TeleHealth visit. He feels as if he has gained weight since his last visit. He has lost 18 lbs since starting treatment with [place].  Fatigue Brandon [name] noted decreased energy since he has been more sedentary. His exercise intolerance has worsened with no exercise and he is not getting good sleep. This has worsened with weight gain and has worsened recently.  Vitamin D Deficiency Brandon [name] has a diagnosis of vitamin D deficiency. He is currently stable on vit D, which is slowly improving, but is not yet at goal.  ASSESSMENT AND PLAN:  Vitamin D deficiency - Plan: Vitamin D, Ergocalciferol, (DRISDOL) 1.25 MG (50000 UT) CAPS capsule  Other fatigue  Class 3 severe obesity with serious comorbidity and body mass index (BMI) of 40.0 to 44.9 in adult, unspecified obesity type (HCC)  PLAN:  Fatigue Brandon [name] was informed that his fatigue may be related to obesity, depression or many other causes. This is likely due to a combination of reduced activity and possible depression. He shows no signs of suicidal ideas and homicidal ideations.  Brandon [name] has agreed to try to increase activity, work on diet, and weight loss to help with fatigue. Proper sleep hygiene was discussed including the need for 7-8 hours of quality sleep each night. Brandon [name] agree to follow up in 2 weeks to monitor his progress.  Vitamin D Deficiency Brandon [name] was informed that low vitamin D levels contribute to fatigue and are associated with obesity, breast, and colon cancer. Brandon [name] agrees to continue to take prescription Vit D ,000 IU every week #4 with no refills and will follow up for routine testing of vitamin D, at least 2-3 times per year. He was informed of the risk of over-replacement of vitamin D and agrees to not increase his dose unless he discusses this with [place] first. We will check labs in 1 month. Brandon [name] agrees to follow up in 2 weeks as directed.  Obesity Brandon [name] is currently in the action stage of change. As such, his goal is to continue with weight loss efforts. He has agreed to get back to following the Category 4 plan strictly. Brandon [name] has been instructed to increase exercise to 10 minutes per day. We discussed the following Behavioral Modification Strategies today: work on meal planning and easy cooking plans, emotional eating strategies, better snacking choices, and ways to avoid night time snacking.  Brandon [name] has agreed to follow up with our clinic in 2 weeks. He was informed of the importance of frequent follow up visits to maximize his success with intensive lifestyle modifications for his multiple health conditions.  ALLERGIES: Allergies  Allergen Reactions  . Amoxicillin Anaphylaxis    Has patient had a PCN reaction causing immediate rash, facial/tongue/throat swelling, SOB or lightheadedness with hypotension: Yes Has patient had a PCN reaction causing severe rash involving mucus membranes or skin necrosis: No Has patient had a PCN reaction that required hospitalization: No Has patient had a PCN reaction occurring within the last 10  years: No If all of the above answers are "NO", then may proceed with Cephalosporin use.   Brandon [name] Kitchen. Penicillin G Anaphylaxis    Gets extremely high fevers. Has patient had a PCN reaction causing immediate rash, facial/tongue/throat swelling, SOB or lightheadedness with hypotension: Yes Has patient had a PCN reaction causing severe rash involving mucus membranes or skin necrosis: No Has patient had a PCN reaction that required hospitalization: No Has patient had a PCN reaction occurring within the last 10 years: No If all of the above answers are "NO", then may proceed with Cephalosporin use.   . Sulfa Antibiotics Other (See Comments)    It will kill him  . Cephalosporins Other (See Comments)    Reaction unspecified.  . Quinolones Other (See Comments)    Reaction unspecified  . Sulfamethoxazole Swelling    MEDICATIONS: Current Outpatient Medications on File Prior to Visit  Medication Sig Dispense Refill  . atorvastatin (LIPITOR) 20 MG tablet TAKE 1 TABLET (20 MG TOTAL) BY MOUTH AT BEDTIME. (Patient taking differently: Take 20 mg by mouth at bedtime. ) 30 tablet 0  . colesevelam (WELCHOL) 625 MG tablet Take 3 tablets (1,875 mg total) by mouth daily with breakfast. 540 tablet 1  . cyclobenzaprine (FLEXERIL) 10 MG tablet Take 1 tablet (10 mg total) by mouth 3 (three) times daily as needed for muscle spasms. 30 tablet 0  . diazepam (VALIUM) 5 MG tablet Take 1 tablet (5 mg total) by mouth every 8 (eight) hours as needed for anxiety. (Patient taking differently: Take 5 mg by mouth at bedtime. ) 20 tablet 0  . docusate sodium (COLACE) 100 MG capsule Take 200 mg by mouth 2 (two) times daily. Taken at 08:00 and 20:00    . gabapentin (NEURONTIN) 300 MG capsule Take 3 capsules (900 mg total) by mouth 2 (two) times daily. 540 capsule 1  . Insulin Pen [name] (BD PEN [name] NANO U/F) 32G X 4 MM MISC 1 Package by Does not apply route 2 (two) times daily. 100 each 0  . liraglutide (VICTOZA) 18 MG/3ML SOPN  Inject 0.1 mLs (0.6 mg total) into the skin every morning. 1 pen 0  . lisinopril-hydrochlorothiazide (PRINZIDE,ZESTORETIC) 10-12.5 MG tablet Take 1 tablet by mouth at bedtime. 0000 90 tablet 1  . metFORMIN (GLUCOPHAGE-XR) 500 MG 24 hr tablet TAKE 2 TABLETS (1,000 MG TOTAL) BY MOUTH 2 (TWO) TIMES DAILY. (Patient taking differently: Take 1,000 mg by mouth 2 (two) times daily. TAKE 2 TABLETS (1,000 MG TOTAL) BY MOUTH 2 (TWO) TIMES DAILY.) 360 tablet 1  . NON FORMULARY CPAP machine at bedtime & sleep     No current facility-administered medications on file prior to visit.     PAST MEDICAL HISTORY: Past Medical History:  Diagnosis Date  . ADEM (acute disseminated encephalomyelitis)   . Allergy   . Anxiety 2005  . Complication of anesthesia   . Constipation   . Depression 2005  . Diabetes mellitus without complication (HCC)    Type 2  . Difficult intubation    difficult intubating and exbutating "throat Clinched around tube" during an Endoscopy procedure  . Dyspnea  with exertion  . Headache(784.0)   . Hearing loss   . Hyperinsulinemia 07/04/11   "I'm on metformin cause I produce too much insulin"  . Hyperlipidemia   . Hypertension   . Mononucleosis, infectious, with hepatitis 06/2011  . MS (multiple sclerosis) (HCC)    patinet has ADEM not MS per Patient  . Nausea    occasional per history form 08/18/11  . Neuromuscular disorder (HCC)   . Pneumonia 2010  . Sleep apnea    uses CPAP at home  . Stroke (HCC) 2000   TIA  . TIA (transient ischemic attack) 2000   denies residual  . TIA (transient ischemic attack)     PAST SURGICAL HISTORY: Past Surgical History:  Procedure Laterality Date  . ANTERIOR CERVICAL DECOMP/DISCECTOMY FUSION N/A 10/21/2016   Procedure: ANTERIOR CERVICAL DECOMPRESSION/DISCECTOMY FUSION, INTERBODY PROSTHESIS,PLATE CERVICAL FOUR- CERVICAL FIVE, CERVICAL FIVE- CERVICAL SIX;  Surgeon: Tressie Stalker, MD;  Location: [facility] OR;  Service: Neurosurgery;  Laterality:  N/A;  . ANTERIOR CERVICAL DECOMP/DISCECTOMY FUSION N/A 05/19/2018   Procedure: ANTERIOR CERVICAL DECOMPRESSION/DISCECTOMY FUSION, INTERBODY PROSTHESIS,PLATE/SCREWS CERVICAL SIX- CERVICAL SEVEN; EXPLORE FUSION;  Surgeon: Tressie Stalker, MD;  Location: [facility] OR;  Service: Neurosurgery;  Laterality: N/A;  . CHOLECYSTECTOMY  08/02/2011   Procedure: LAPAROSCOPIC CHOLECYSTECTOMY;  Surgeon: Shelly Rubenstein, MD;  Location: [facility] OR;  Service: General;;  . COLONOSCOPY  2003  . ESOPHAGOGASTRODUODENOSCOPY  2003  . ESOPHAGOGASTRODUODENOSCOPY  07/28/2011   Procedure: ESOPHAGOGASTRODUODENOSCOPY (EGD);  Surgeon: [name], MD;  Location: [facility] ENDOSCOPY;  Service: Endoscopy;  Laterality: N/A;  . MYRINGOTOMY  1979; 1980   bilaterally  . TONSILLECTOMY AND ADENOIDECTOMY  ~ 1980    SOCIAL HISTORY: Social History   Tobacco Use  . Smoking status: Former Smoker    Packs/day: 3.00    Years: 3.00    Pack years: 9.00    Types: Cigarettes    Last attempt to quit: 08/18/1987    Years since quitting: 31.2  . Smokeless tobacco: Never Used  Substance Use Topics  . Alcohol use: Not Currently    Alcohol/week: 0.0 standard drinks    Comment: 07/04/11 "glass of wine or spirits once a month"  . Drug use: Not Currently    Types: Marijuana    Comment: have not used in a 1.5 years    FAMILY HISTORY: Family History  Problem Relation Age of Onset  . Coronary artery disease Father   . Stroke Father   . Cancer Father   . High blood pressure Father   . Alcoholism Father   . Diabetes Mother   . Obesity Mother   . Cancer Maternal Grandmother        bone  . Cancer Other        Stomach -Great-great grandmother    ROS: Review of Systems  Constitutional: Positive for malaise/fatigue.  Psychiatric/Behavioral: Negative for suicidal ideas.       Negative for homicidal ideations.    PHYSICAL EXAM: Pt in no acute distress  RECENT LABS AND TESTS: BMET    Component Value Date/Time   NA 144 09/26/2018 1126   K  4.0 09/26/2018 1126   CL 98 09/26/2018 1126   CO2 26 09/26/2018 1126   GLUCOSE 85 09/26/2018 1126   GLUCOSE 83 05/13/2018 0957   BUN 17 09/26/2018 1126   CREATININE 1.09 09/26/2018 1126   CREATININE 1.02 08/24/2017 1458   CALCIUM 10.2 09/26/2018 1126   GFRNONAA 80 09/26/2018 1126   GFRNONAA >89 07/18/2015 1656   GFRAA 93 09/26/2018  1126   GFRAA >89 07/18/2015 1656   Lab Results  Component Value Date   HGBA1C 5.6 09/26/2018   HGBA1C 4.9 05/19/2018   HGBA1C 5.0 05/13/2018   HGBA1C 5.5 02/16/2018   HGBA1C 5.7 (H) 09/15/2017   Lab Results  Component Value Date   INSULIN 76.8 (H) 09/26/2018   INSULIN 60.7 (H) 02/16/2018   INSULIN 110.6 (H) 09/15/2017   CBC    Component Value Date/Time   WBC 6.1 09/26/2018 1126   WBC 5.0 05/13/2018 0957   RBC 6.88 (H) 09/26/2018 1126   RBC 5.71 05/13/2018 0957   HGB 18.2 (H) 09/26/2018 1126   HCT 55.8 (H) 09/26/2018 1126   PLT 161 05/13/2018 0957   PLT 158 09/17/2016 1611   MCV 81 09/26/2018 1126   MCH 26.5 (L) 09/26/2018 1126   MCH 26.8 05/13/2018 0957   MCHC 32.6 09/26/2018 1126   MCHC 32.6 05/13/2018 0957   RDW 15.3 09/26/2018 1126   LYMPHSABS 2.2 09/26/2018 1126   MONOABS 0.5 12/05/2014 1538   EOSABS 0.1 09/26/2018 1126   BASOSABS 0.0 09/26/2018 1126   Iron/TIBC/Ferritin/ %Sat No results found for: IRON, TIBC, FERRITIN, IRONPCTSAT Lipid Panel     Component Value Date/Time   CHOL 121 09/26/2018 1126   TRIG 250 (H) 09/26/2018 1126   HDL 32 (L) 09/26/2018 1126   CHOLHDL 3.4 08/24/2017 1458   VLDL 70 (H) 10/15/2015 1112   LDLCALC 39 09/26/2018 1126   LDLCALC 57 08/24/2017 1458   Hepatic Function Panel     Component Value Date/Time   PROT 7.4 09/26/2018 1126   ALBUMIN 4.9 09/26/2018 1126   AST 38 09/26/2018 1126   ALT 93 (H) 09/26/2018 1126   ALKPHOS 70 09/26/2018 1126   BILITOT 0.6 09/26/2018 1126   BILIDIR 0.1 04/11/2015 1638   IBILI 0.6 04/11/2015 1638      Component Value Date/Time   TSH 2.200 09/26/2018 1126    TSH 2.080 09/15/2017 0902   TSH 3.190 09/17/2016 1611   Results for STEFON, RAMTHUN "[facility]" (MRN [id number]) as of 11/17/2018 09:45  Ref. Range 09/26/2018 11:26  Vitamin D, 25-Hydroxy Latest Ref Range: 30.0 - 100.0 ng/mL 33.3     I, Kirke Corin, CMA, am acting as transcriptionist for Wilder Glade, MD I have reviewed the above documentation for accuracy and completeness, and I agree with the above. -Quillian Quince, MD

## 2018-11-21 DIAGNOSIS — M25562 Pain in left knee: Secondary | ICD-10-CM | POA: Diagnosis not present

## 2018-11-21 DIAGNOSIS — G6289 Other specified polyneuropathies: Secondary | ICD-10-CM | POA: Diagnosis not present

## 2018-11-21 DIAGNOSIS — G8929 Other chronic pain: Secondary | ICD-10-CM | POA: Diagnosis not present

## 2018-11-21 DIAGNOSIS — E119 Type 2 diabetes mellitus without complications: Secondary | ICD-10-CM | POA: Diagnosis not present

## 2018-11-21 DIAGNOSIS — M25561 Pain in right knee: Secondary | ICD-10-CM | POA: Diagnosis not present

## 2018-11-21 DIAGNOSIS — M542 Cervicalgia: Secondary | ICD-10-CM | POA: Diagnosis not present

## 2018-11-21 DIAGNOSIS — E7849 Other hyperlipidemia: Secondary | ICD-10-CM | POA: Diagnosis not present

## 2018-12-01 ENCOUNTER — Ambulatory Visit (INDEPENDENT_AMBULATORY_CARE_PROVIDER_SITE_OTHER): Payer: 59 | Admitting: Family Medicine

## 2018-12-01 ENCOUNTER — Encounter (INDEPENDENT_AMBULATORY_CARE_PROVIDER_SITE_OTHER): Payer: Self-pay | Admitting: Family Medicine

## 2018-12-01 ENCOUNTER — Other Ambulatory Visit: Payer: Self-pay

## 2018-12-01 DIAGNOSIS — F418 Other specified anxiety disorders: Secondary | ICD-10-CM | POA: Diagnosis not present

## 2018-12-01 DIAGNOSIS — Z6841 Body Mass Index (BMI) 40.0 and over, adult: Secondary | ICD-10-CM

## 2018-12-01 NOTE — Progress Notes (Signed)
Office: [phone]  /  Fax: [phone] TeleHealth Visit:  Brandon [name] has verbally consented to this TeleHealth visit today. The patient is located at home, the provider is located at the [facility] office. The participants in this visit include the listed provider and patient. The visit was conducted today via [email or web address]  HPI:   Chief Complaint: OBESITY Brandon [name] is here to discuss his progress with his obesity treatment plan. He is on the Category 4 plan + 500 and is following his eating plan approximately 30 % of the time. He states he is exercising 0 minutes 0 times per week. [name] hasn't been following his plan closely. He is eating take out more and has been gaining weight. He weighed himself at 301 pounds yesterday. He is spending much more time online and the world's problems are starting to consume him.  We were unable to weigh the patient today for this TeleHealth visit. He feels unsure if he has lost weight since his last visit. He has lost 18 lbs since starting treatment with [place].  Depression with Anxiety Rien notes feeling more distressed about world events. He is spending more time online and feeling more consumed. He has been working on behavior modification techniques to help reduce his emotional eating and has been somewhat successful.   Depression screen [facility] 2/9 03/25/2018 02/17/2018 09/15/2017 08/24/2017 07/08/2017  Decreased Interest 0 0 1 0 0  Down, Depressed, Hopeless 0 0 1 0 0  PHQ - 2 Score 0 0 2 0 0  Altered sleeping - - 2 - -  Tired, decreased energy - - 1 - -  Change in appetite - - 3 - -  Feeling bad or failure about yourself  - - 1 - -  Trouble concentrating - - 3 - -  Moving slowly or fidgety/restless - - 1 - -  Suicidal thoughts - - 0 - -  PHQ-9 Score - - 13 - -   ASSESSMENT AND PLAN:  Depression with anxiety  Class 3 severe obesity with serious comorbidity and body mass index (BMI) of 40.0 to 44.9 in adult, unspecified obesity type  (HCC)  PLAN:  Depression with Anxiety We discussed cognitive behavior modification techniques today to help Brandon [name] prioritize and to help decrease his anxiety about things that he cannot control. He was offered support and encouragement. He has agreed to follow up as directed in 2 weeks.  I spent > than 50% of the 25 minute visit on counseling as documented in the note.  Obesity Brandon [name] is currently in the action stage of change. As such, his goal is to maintain weight for now. He has agreed to portion control better and make smarter food choices, such as increase vegetables and decrease simple carbohydrates.  Brandon [name] has been instructed to work up to a goal of 150 minutes of combined cardio and strengthening exercise per week for weight loss and overall health benefits. We discussed the following Behavioral Modification Strategies today: emotional eating strategies.  Brandon [name] has agreed to follow up with our clinic in 2 weeks. He was informed of the importance of frequent follow up visits to maximize his success with intensive lifestyle modifications for his multiple health conditions.  ALLERGIES: Allergies  Allergen Reactions  . Amoxicillin Anaphylaxis    Has patient had a PCN reaction causing immediate rash, facial/tongue/throat swelling, SOB or lightheadedness with hypotension: Yes Has patient had a PCN reaction causing severe rash involving mucus membranes or skin necrosis: No Has patient had  a PCN reaction that required hospitalization: No Has patient had a PCN reaction occurring within the last 10 years: No If all of the above answers are "NO", then may proceed with Cephalosporin use.   [name] Penicillin G Anaphylaxis    Gets extremely high fevers. Has patient had a PCN reaction causing immediate rash, facial/tongue/throat swelling, SOB or lightheadedness with hypotension: Yes Has patient had a PCN reaction causing severe rash involving mucus membranes or skin necrosis: No Has  patient had a PCN reaction that required hospitalization: No Has patient had a PCN reaction occurring within the last 10 years: No If all of the above answers are "NO", then may proceed with Cephalosporin use.   . Sulfa Antibiotics Other (See Comments)    It will kill him  . Cephalosporins Other (See Comments)    Reaction unspecified.  . Quinolones Other (See Comments)    Reaction unspecified  . Sulfamethoxazole Swelling    MEDICATIONS: Current Outpatient Medications on File Prior to Visit  Medication Sig Dispense Refill  . atorvastatin (LIPITOR) 20 MG tablet TAKE 1 TABLET (20 MG TOTAL) BY MOUTH AT BEDTIME. (Patient taking differently: Take 20 mg by mouth at bedtime. ) 30 tablet 0  . colesevelam (WELCHOL) 625 MG tablet Take 3 tablets (1,875 mg total) by mouth daily with breakfast. 540 tablet 1  . cyclobenzaprine (FLEXERIL) 10 MG tablet Take 1 tablet (10 mg total) by mouth 3 (three) times daily as needed for muscle spasms. 30 tablet 0  . diazepam (VALIUM) 5 MG tablet Take 1 tablet (5 mg total) by mouth every 8 (eight) hours as needed for anxiety. (Patient taking differently: Take 5 mg by mouth at bedtime. ) 20 tablet 0  . docusate sodium (COLACE) 100 MG capsule Take 200 mg by mouth 2 (two) times daily. Taken at 08:00 and 20:00    . gabapentin (NEURONTIN) 300 MG capsule Take 3 capsules (900 mg total) by mouth 2 (two) times daily. 540 capsule 1  . Insulin Pen Needle (BD PEN NEEDLE NANO U/F) 32G X 4 MM MISC 1 Package by Does not apply route 2 (two) times daily. 100 each 0  . liraglutide (VICTOZA) 18 MG/3ML SOPN Inject 0.1 mLs (0.6 mg total) into the skin every morning. 1 pen 0  . lisinopril-hydrochlorothiazide (PRINZIDE,ZESTORETIC) 10-12.5 MG tablet Take 1 tablet by mouth at bedtime. 0000 90 tablet 1  . metFORMIN (GLUCOPHAGE-XR) 500 MG 24 hr tablet TAKE 2 TABLETS (1,000 MG TOTAL) BY MOUTH 2 (TWO) TIMES DAILY. (Patient taking differently: Take 1,000 mg by mouth 2 (two) times daily. TAKE 2 TABLETS  (1,000 MG TOTAL) BY MOUTH 2 (TWO) TIMES DAILY.) 360 tablet 1  . NON FORMULARY CPAP machine at bedtime & sleep    . Vitamin D, Ergocalciferol, (DRISDOL) 1.25 MG (50000 UT) CAPS capsule Take 1 capsule (50,000 Units total) by mouth every 7 (seven) days. 4 capsule 0   No current facility-administered medications on file prior to visit.     PAST MEDICAL HISTORY: Past Medical History:  Diagnosis Date  . ADEM (acute disseminated encephalomyelitis)   . Allergy   . Anxiety 2005  . Complication of anesthesia   . Constipation   . Depression 2005  . Diabetes mellitus without complication (HCC)    Type 2  . Difficult intubation    difficult intubating and exbutating "throat Clinched around tube" during an Endoscopy procedure  . Dyspnea    with exertion  . Headache(784.0)   . Hearing loss   . Hyperinsulinemia 07/04/11   "  I'm on metformin cause I produce too much insulin"  . Hyperlipidemia   . Hypertension   . Mononucleosis, infectious, with hepatitis 06/2011  . MS (multiple sclerosis) (HCC)    patinet has ADEM not MS per Patient  . Nausea    occasional per history form 08/18/11  . Neuromuscular disorder (HCC)   . Pneumonia 2010  . Sleep apnea    uses CPAP at home  . Stroke (HCC) 2000   TIA  . TIA (transient ischemic attack) 2000   denies residual  . TIA (transient ischemic attack)     PAST SURGICAL HISTORY: Past Surgical History:  Procedure Laterality Date  . ANTERIOR CERVICAL DECOMP/DISCECTOMY FUSION N/A 10/21/2016   Procedure: ANTERIOR CERVICAL DECOMPRESSION/DISCECTOMY FUSION, INTERBODY PROSTHESIS,PLATE CERVICAL FOUR- CERVICAL FIVE, CERVICAL FIVE- CERVICAL SIX;  Surgeon: Tressie StalkerJeffrey Jenkins, MD;  Location: [facility] OR;  Service: Neurosurgery;  Laterality: N/A;  . ANTERIOR CERVICAL DECOMP/DISCECTOMY FUSION N/A 05/19/2018   Procedure: ANTERIOR CERVICAL DECOMPRESSION/DISCECTOMY FUSION, INTERBODY PROSTHESIS,PLATE/SCREWS CERVICAL SIX- CERVICAL SEVEN; EXPLORE FUSION;  Surgeon: Tressie StalkerJenkins, Jeffrey,  MD;  [facility];  Service: Neurosurgery;  Laterality: N/A;  . CHOLECYSTECTOMY  08/02/2011   Procedure: LAPAROSCOPIC CHOLECYSTECTOMY;  Surgeon: Shelly Rubensteinouglas A Blackman, MD;  Location: [facility] OR;  Service: General;;  . COLONOSCOPY  2003  . ESOPHAGOGASTRODUODENOSCOPY  2003  . ESOPHAGOGASTRODUODENOSCOPY  07/28/2011   Procedure: ESOPHAGOGASTRODUODENOSCOPY (EGD);  Surgeon: Yancey FlemingsJohn Perry, MD;  [facility];  Service: Endoscopy;  Laterality: N/A;  . MYRINGOTOMY  1979; 1980   bilaterally  . TONSILLECTOMY AND ADENOIDECTOMY  ~ 1980    SOCIAL HISTORY: Social History   Tobacco Use  . Smoking status: Former Smoker    Packs/day: 3.00    Years: 3.00    Pack years: 9.00    Types: Cigarettes    Last attempt to quit: 08/18/1987    Years since quitting: 31.3  . Smokeless tobacco: Never Used  Substance Use Topics  . Alcohol use: Not Currently    Alcohol/week: 0.0 standard drinks    Comment: 07/04/11 "glass of wine or spirits once a month"  . Drug use: Not Currently    Types: Marijuana    Comment: have not used in a 1.5 years    FAMILY HISTORY: Family History  Problem Relation Age of Onset  . Coronary artery disease Father   . Stroke Father   . Cancer Father   . High blood pressure Father   . Alcoholism Father   . Diabetes Mother   . Obesity Mother   . Cancer Maternal Grandmother        bone  . Cancer Other        Stomach -Great-great grandmother    ROS: Review of Systems  Psychiatric/Behavioral: Positive for depression. The patient is nervous/anxious.     PHYSICAL EXAM: Pt in no acute distress  RECENT LABS AND TESTS: BMET    Component Value Date/Time   NA 144 09/26/2018 1126   K 4.0 09/26/2018 1126   CL 98 09/26/2018 1126   CO2 26 09/26/2018 1126   GLUCOSE 85 09/26/2018 1126   GLUCOSE 83 05/13/2018 0957   BUN 17 09/26/2018 1126   CREATININE 1.09 09/26/2018 1126   CREATININE 1.02 08/24/2017 1458   CALCIUM 10.2 09/26/2018 1126   GFRNONAA 80 09/26/2018 1126   GFRNONAA  >89 07/18/2015 1656   GFRAA 93 09/26/2018 1126   GFRAA >89 07/18/2015 1656   Lab Results  Component Value Date   HGBA1C 5.6 09/26/2018   HGBA1C 4.9 05/19/2018   HGBA1C  5.0 05/13/2018   HGBA1C 5.5 02/16/2018   HGBA1C 5.7 (H) 09/15/2017   Lab Results  Component Value Date   INSULIN 76.8 (H) 09/26/2018   INSULIN 60.7 (H) 02/16/2018   INSULIN 110.6 (H) 09/15/2017   CBC    Component Value Date/Time   WBC 6.1 09/26/2018 1126   WBC 5.0 05/13/2018 0957   RBC 6.88 (H) 09/26/2018 1126   RBC 5.71 05/13/2018 0957   HGB 18.2 (H) 09/26/2018 1126   HCT 55.8 (H) 09/26/2018 1126   PLT 161 05/13/2018 0957   PLT 158 09/17/2016 1611   MCV 81 09/26/2018 1126   MCH 26.5 (L) 09/26/2018 1126   MCH 26.8 05/13/2018 0957   MCHC 32.6 09/26/2018 1126   MCHC 32.6 05/13/2018 0957   RDW 15.3 09/26/2018 1126   LYMPHSABS 2.2 09/26/2018 1126   MONOABS 0.5 12/05/2014 1538   EOSABS 0.1 09/26/2018 1126   BASOSABS 0.0 09/26/2018 1126   Iron/TIBC/Ferritin/ %Sat No results found for: IRON, TIBC, FERRITIN, IRONPCTSAT Lipid Panel     Component Value Date/Time   CHOL 121 09/26/2018 1126   TRIG 250 (H) 09/26/2018 1126   HDL 32 (L) 09/26/2018 1126   CHOLHDL 3.4 08/24/2017 1458   VLDL 70 (H) 10/15/2015 1112   LDLCALC 39 09/26/2018 1126   LDLCALC 57 08/24/2017 1458   Hepatic Function Panel     Component Value Date/Time   PROT 7.4 09/26/2018 1126   ALBUMIN 4.9 09/26/2018 1126   AST 38 09/26/2018 1126   ALT 93 (H) 09/26/2018 1126   ALKPHOS 70 09/26/2018 1126   BILITOT 0.6 09/26/2018 1126   BILIDIR 0.1 04/11/2015 1638   IBILI 0.6 04/11/2015 1638      Component Value Date/Time   TSH 2.200 09/26/2018 1126   TSH 2.080 09/15/2017 0902   TSH 3.190 09/17/2016 1611   Results for ELIHUE, LUNDSTEN "[name]" (MRN [id number]) as of 12/01/2018 16:00  Ref. Range 09/26/2018 11:26  Vitamin D, 25-Hydroxy Latest Ref Range: 30.0 - 100.0 ng/mL 33.3     I, Kirke Corin, CMA, am acting as transcriptionist for  Wilder Glade, MD I have reviewed the above documentation for accuracy and completeness, and I agree with the above. -Quillian Quince, MD

## 2018-12-11 ENCOUNTER — Other Ambulatory Visit (INDEPENDENT_AMBULATORY_CARE_PROVIDER_SITE_OTHER): Payer: Self-pay | Admitting: Family Medicine

## 2018-12-11 DIAGNOSIS — E119 Type 2 diabetes mellitus without complications: Secondary | ICD-10-CM

## 2018-12-15 ENCOUNTER — Other Ambulatory Visit: Payer: Self-pay

## 2018-12-15 ENCOUNTER — Encounter (INDEPENDENT_AMBULATORY_CARE_PROVIDER_SITE_OTHER): Payer: Self-pay | Admitting: Family Medicine

## 2018-12-15 ENCOUNTER — Ambulatory Visit (INDEPENDENT_AMBULATORY_CARE_PROVIDER_SITE_OTHER): Payer: 59 | Admitting: Family Medicine

## 2018-12-15 DIAGNOSIS — Z6841 Body Mass Index (BMI) 40.0 and over, adult: Secondary | ICD-10-CM

## 2018-12-15 DIAGNOSIS — E119 Type 2 diabetes mellitus without complications: Secondary | ICD-10-CM | POA: Diagnosis not present

## 2018-12-20 NOTE — Progress Notes (Signed)
Office: [phone]  /  Fax: [phone] TeleHealth Visit:  [name] has verbally consented to this TeleHealth visit today. The patient is located at home, the provider is located at the [facility] office. The participants in this visit include the listed provider and patient and any and all parties involved. The visit was conducted today via telephone. Brandon [name] was unable to use realtime audiovisual technology today (FaceTime failed) and the telehealth visit was conducted via telephone.  HPI:   Chief Complaint: OBESITY Brandon [name] is here to discuss his progress with his obesity treatment plan. He is on the Category 4 plan +500 calories and is following his eating plan approximately 30 % of the time. He states he is doing light walking 5 minutes 4 to 5 times per week. [name] feels he is doing well maintaining his weight. He has gone back to drinking sodas and he is working on decreasing this. He has been using sugar for comfort. We were unable to weigh the patient today for this TeleHealth visit. He feels as if he has lost weight since his last visit. He has lost approximately 19 lbs since starting treatment with [place].  Diabetes II [name] has a diagnosis of diabetes type II. [name] is not checking his blood sugar at home. He denies any hypoglycemic episodes. His last A1c was well controlled at 5.6 [name] has been forgetting to take his Victoza regularly. He has been working on intensive lifestyle modifications including diet, exercise, and weight loss to help control his blood glucose levels.  ASSESSMENT AND PLAN:  Type 2 diabetes mellitus without complication, without long-term current use of insulin (HCC)  Class 3 severe obesity with serious comorbidity and body mass index (BMI) of 40.0 to 44.9 in adult, unspecified obesity type (HCC)  PLAN:  Diabetes II [name] has been given extensive diabetes education by myself today including ideal fasting and post-prandial  blood glucose readings, individual ideal Hgb A1c goals and hypoglycemia prevention. We discussed the importance of good blood sugar control to decrease the likelihood of diabetic complications such as nephropathy, neuropathy, limb loss, blindness, coronary artery disease, and death. We discussed the importance of intensive lifestyle modification including diet, exercise and weight loss as the first line treatment for diabetes. [name] agrees to take his diabetes medications regularly and will continue to monitor. [name] will get back to his diet prescription regularly and he will follow up at the agreed upon time.  I spent > than 50% of the 25 minute visit on counseling as documented in the note.  Obesity [name] is currently in the action stage of change. As such, his goal is to continue with weight loss efforts He has agreed to follow the Category 4 plan [name] has been instructed to work up to a goal of 150 minutes of combined cardio and strengthening exercise per week for weight loss and overall health benefits. We discussed the following Behavioral Modification Strategies today: keeping healthy foods in the home, increasing lean protein intake, decreasing simple carbohydrates, increasing vegetables, work on meal planning and easy cooking plans, emotional eating strategies and decrease liquid calories  [name] has agreed to follow up with our clinic in 2 weeks. He was informed of the importance of frequent follow up visits to maximize his success with intensive lifestyle modifications for his multiple health conditions.  ALLERGIES: Allergies  Allergen Reactions  . Amoxicillin Anaphylaxis    Has patient had a PCN reaction causing immediate rash, facial/tongue/throat swelling, SOB or lightheadedness with hypotension:  Yes Has patient had a PCN reaction causing severe rash involving mucus membranes or skin necrosis: No Has patient had a PCN reaction that required hospitalization: No Has patient  had a PCN reaction occurring within the last 10 years: No If all of the above answers are "NO", then may proceed with Cephalosporin use.   [name]. Penicillin G Anaphylaxis    Gets extremely high fevers. Has patient had a PCN reaction causing immediate rash, facial/tongue/throat swelling, SOB or lightheadedness with hypotension: Yes Has patient had a PCN reaction causing severe rash involving mucus membranes or skin necrosis: No Has patient had a PCN reaction that required hospitalization: No Has patient had a PCN reaction occurring within the last 10 years: No If all of the above answers are "NO", then may proceed with Cephalosporin use.   . Sulfa Antibiotics Other (See Comments)    It will kill him  . Cephalosporins Other (See Comments)    Reaction unspecified.  . Quinolones Other (See Comments)    Reaction unspecified  . Sulfamethoxazole Swelling    MEDICATIONS: Current Outpatient Medications on File Prior to Visit  Medication Sig Dispense Refill  . atorvastatin (LIPITOR) 20 MG tablet TAKE 1 TABLET (20 MG TOTAL) BY MOUTH AT BEDTIME. (Patient taking differently: Take 20 mg by mouth at bedtime. ) 30 tablet 0  . colesevelam (WELCHOL) 625 MG tablet Take 3 tablets (1,875 mg total) by mouth daily with breakfast. 540 tablet 1  . cyclobenzaprine (FLEXERIL) 10 MG tablet Take 1 tablet (10 mg total) by mouth 3 (three) times daily as needed for muscle spasms. 30 tablet 0  . diazepam (VALIUM) 5 MG tablet Take 1 tablet (5 mg total) by mouth every 8 (eight) hours as needed for anxiety. (Patient taking differently: Take 5 mg by mouth at bedtime. ) 20 tablet 0  . docusate sodium (COLACE) 100 MG capsule Take 200 mg by mouth 2 (two) times daily. Taken at 08:00 and 20:00    . gabapentin (NEURONTIN) 300 MG capsule Take 3 capsules (900 mg total) by mouth 2 (two) times daily. 540 capsule 1  . Insulin Pen Needle (BD PEN NEEDLE NANO U/F) 32G X 4 MM MISC 1 Package by Does not apply route 2 (two) times daily. 100 each  0  . liraglutide (VICTOZA) 18 MG/3ML SOPN Inject 0.1 mLs (0.6 mg total) into the skin every morning. 1 pen 0  . lisinopril-hydrochlorothiazide (PRINZIDE,ZESTORETIC) 10-12.5 MG tablet Take 1 tablet by mouth at bedtime. 0000 90 tablet 1  . metFORMIN (GLUCOPHAGE-XR) 500 MG 24 hr tablet TAKE 2 TABLETS (1,000 MG TOTAL) BY MOUTH 2 (TWO) TIMES DAILY. (Patient taking differently: Take 1,000 mg by mouth 2 (two) times daily. TAKE 2 TABLETS (1,000 MG TOTAL) BY MOUTH 2 (TWO) TIMES DAILY.) 360 tablet 1  . NON FORMULARY CPAP machine at bedtime & sleep    . Vitamin D, Ergocalciferol, (DRISDOL) 1.25 MG (50000 UT) CAPS capsule Take 1 capsule (50,000 Units total) by mouth every 7 (seven) days. 4 capsule 0   No current facility-administered medications on file prior to visit.     PAST MEDICAL HISTORY: Past Medical History:  Diagnosis Date  . ADEM (acute disseminated encephalomyelitis)   . Allergy   . Anxiety 2005  . Complication of anesthesia   . Constipation   . Depression 2005  . Diabetes mellitus without complication (HCC)    Type 2  . Difficult intubation    difficult intubating and exbutating "throat Clinched around tube" during an Endoscopy procedure  .  Dyspnea    with exertion  . Headache(784.0)   . Hearing loss   . Hyperinsulinemia 07/04/11   "I'm on metformin cause I produce too much insulin"  . Hyperlipidemia   . Hypertension   . Mononucleosis, infectious, with hepatitis 06/2011  . MS (multiple sclerosis) (HCC)    patinet has ADEM not MS per Patient  . Nausea    occasional per history form 08/18/11  . Neuromuscular disorder (HCC)   . Pneumonia 2010  . Sleep apnea    uses CPAP at home  . Stroke (HCC) 2000   TIA  . TIA (transient ischemic attack) 2000   denies residual  . TIA (transient ischemic attack)     PAST SURGICAL HISTORY: Past Surgical History:  Procedure Laterality Date  . ANTERIOR CERVICAL DECOMP/DISCECTOMY FUSION N/A 10/21/2016   Procedure: ANTERIOR CERVICAL  DECOMPRESSION/DISCECTOMY FUSION, INTERBODY PROSTHESIS,PLATE CERVICAL FOUR- CERVICAL FIVE, CERVICAL FIVE- CERVICAL SIX;  Surgeon: Tressie StalkerJeffrey Jenkins, MD;  Location: [facility] OR;  Service: Neurosurgery;  Laterality: N/A;  . ANTERIOR CERVICAL DECOMP/DISCECTOMY FUSION N/A 05/19/2018   Procedure: ANTERIOR CERVICAL DECOMPRESSION/DISCECTOMY FUSION, INTERBODY PROSTHESIS,PLATE/SCREWS CERVICAL SIX- CERVICAL SEVEN; EXPLORE FUSION;  Surgeon: Tressie StalkerJenkins, Jeffrey, MD;  [facility];  Service: Neurosurgery;  Laterality: N/A;  . CHOLECYSTECTOMY  08/02/2011   Procedure: LAPAROSCOPIC CHOLECYSTECTOMY;  Surgeon: Shelly Rubensteinouglas A Blackman, MD;  Location: [facility] OR;  Service: General;;  . COLONOSCOPY  2003  . ESOPHAGOGASTRODUODENOSCOPY  2003  . ESOPHAGOGASTRODUODENOSCOPY  07/28/2011   Procedure: ESOPHAGOGASTRODUODENOSCOPY (EGD);  Surgeon: Yancey FlemingsJohn Perry, MD;  [facility];  Service: Endoscopy;  Laterality: N/A;  . MYRINGOTOMY  1979; 1980   bilaterally  . TONSILLECTOMY AND ADENOIDECTOMY  ~ 1980    SOCIAL HISTORY: Social History   Tobacco Use  . Smoking status: Former Smoker    Packs/day: 3.00    Years: 3.00    Pack years: 9.00    Types: Cigarettes    Quit date: 08/18/1987    Years since quitting: 31.3  . Smokeless tobacco: Never Used  Substance Use Topics  . Alcohol use: Not Currently    Alcohol/week: 0.0 standard drinks    Comment: 07/04/11 "glass of wine or spirits once a month"  . Drug use: Not Currently    Types: Marijuana    Comment: have not used in a 1.5 years    FAMILY HISTORY: Family History  Problem Relation Age of Onset  . Coronary artery disease Father   . Stroke Father   . Cancer Father   . High blood pressure Father   . Alcoholism Father   . Diabetes Mother   . Obesity Mother   . Cancer Maternal Grandmother        bone  . Cancer Other        Stomach -Great-great grandmother    ROS: Review of Systems  Constitutional: Positive for weight loss.  Endo/Heme/Allergies:       Negative for  hypoglycemia    PHYSICAL EXAM: Pt in no acute distress  RECENT LABS AND TESTS: BMET    Component Value Date/Time   NA 144 09/26/2018 1126   K 4.0 09/26/2018 1126   CL 98 09/26/2018 1126   CO2 26 09/26/2018 1126   GLUCOSE 85 09/26/2018 1126   GLUCOSE 83 05/13/2018 0957   BUN 17 09/26/2018 1126   CREATININE 1.09 09/26/2018 1126   CREATININE 1.02 08/24/2017 1458   CALCIUM 10.2 09/26/2018 1126   GFRNONAA 80 09/26/2018 1126   GFRNONAA >89 07/18/2015 1656   GFRAA 93 09/26/2018 1126  GFRAA >89 07/18/2015 1656   Lab Results  Component Value Date   HGBA1C 5.6 09/26/2018   HGBA1C 4.9 05/19/2018   HGBA1C 5.0 05/13/2018   HGBA1C 5.5 02/16/2018   HGBA1C 5.7 (H) 09/15/2017   Lab Results  Component Value Date   INSULIN 76.8 (H) 09/26/2018   INSULIN 60.7 (H) 02/16/2018   INSULIN 110.6 (H) 09/15/2017   CBC    Component Value Date/Time   WBC 6.1 09/26/2018 1126   WBC 5.0 05/13/2018 0957   RBC 6.88 (H) 09/26/2018 1126   RBC 5.71 05/13/2018 0957   HGB 18.2 (H) 09/26/2018 1126   HCT 55.8 (H) 09/26/2018 1126   PLT 161 05/13/2018 0957   PLT 158 09/17/2016 1611   MCV 81 09/26/2018 1126   MCH 26.5 (L) 09/26/2018 1126   MCH 26.8 05/13/2018 0957   MCHC 32.6 09/26/2018 1126   MCHC 32.6 05/13/2018 0957   RDW 15.3 09/26/2018 1126   LYMPHSABS 2.2 09/26/2018 1126   MONOABS 0.5 12/05/2014 1538   EOSABS 0.1 09/26/2018 1126   BASOSABS 0.0 09/26/2018 1126   Iron/TIBC/Ferritin/ %Sat No results found for: IRON, TIBC, FERRITIN, IRONPCTSAT Lipid Panel     Component Value Date/Time   CHOL 121 09/26/2018 1126   TRIG 250 (H) 09/26/2018 1126   HDL 32 (L) 09/26/2018 1126   CHOLHDL 3.4 08/24/2017 1458   VLDL 70 (H) 10/15/2015 1112   LDLCALC 39 09/26/2018 1126   LDLCALC 57 08/24/2017 1458   Hepatic Function Panel     Component Value Date/Time   PROT 7.4 09/26/2018 1126   ALBUMIN 4.9 09/26/2018 1126   AST 38 09/26/2018 1126   ALT 93 (H) 09/26/2018 1126   ALKPHOS 70 09/26/2018 1126    BILITOT 0.6 09/26/2018 1126   BILIDIR 0.1 04/11/2015 1638   IBILI 0.6 04/11/2015 1638      Component Value Date/Time   TSH 2.200 09/26/2018 1126   TSH 2.080 09/15/2017 0902   TSH 3.190 09/17/2016 1611     Ref. Range 09/26/2018 11:26  Vitamin D, 25-Hydroxy Latest Ref Range: 30.0 - 100.0 ng/mL 33.3    I, Doreene Nest, am acting as Location manager for Dennard Nip, MD I have reviewed the above documentation for accuracy and completeness, and I agree with the above. -Dennard Nip, MD

## 2018-12-28 ENCOUNTER — Other Ambulatory Visit: Payer: Self-pay

## 2018-12-28 ENCOUNTER — Telehealth (INDEPENDENT_AMBULATORY_CARE_PROVIDER_SITE_OTHER): Payer: 59 | Admitting: Family Medicine

## 2018-12-28 DIAGNOSIS — Z6841 Body Mass Index (BMI) 40.0 and over, adult: Secondary | ICD-10-CM

## 2018-12-28 DIAGNOSIS — E119 Type 2 diabetes mellitus without complications: Secondary | ICD-10-CM

## 2018-12-29 ENCOUNTER — Telehealth (INDEPENDENT_AMBULATORY_CARE_PROVIDER_SITE_OTHER): Payer: Medicare Other | Admitting: Family Medicine

## 2018-12-29 NOTE — Progress Notes (Signed)
Office: [phone]  /  Fax: [phone] TeleHealth Visit:  Brandon [name] has verbally consented to this TeleHealth visit today. The patient is located at home, the provider is located at the [facility] office. The participants in this visit include the listed provider and patient. Brandon [name] was unable to use realtime audiovisual technology today and the telehealth visit was conducted via telephone.   HPI:   Chief Complaint: OBESITY Brandon [name] is here to discuss his progress with his obesity treatment plan. He is on the Category 4 plan and is following his eating plan approximately 40 % of the time. He states he is exercising 0 minutes 0 times per week. Brandon [name] states he is doing well maintaining his weight, but he states his ADEM has flared up and his fatigue has worsened. He states his hunger is controlled.  We were unable to weigh the patient today for this TeleHealth visit. He feels as if he has maintained his weight since his last visit. He has lost 19 lbs since starting treatment with us.  Diabetes II Brandon [name] has a diagnosis of diabetes type II. Brandon [name] has not been compliant with his Victoza due to forgetting to take it for the last month. He states he is not checking his BGs at home, and he is struggling to follow any eating plan. He denies any hypoglycemic episodes. Last A1c was was at 5.6 in March 2020. He has been working on intensive lifestyle modifications including diet, exercise, and weight loss to help control his blood glucose levels.  ASSESSMENT AND PLAN:  Type 2 diabetes mellitus without complication, without long-term current use of insulin (HCC)  Class 3 severe obesity with serious comorbidity and body mass index (BMI) of 40.0 to 44.9 in adult, unspecified obesity type (HCC)  PLAN:  Diabetes II Brandon [name] has been given extensive diabetes education by myself today including ideal fasting and post-prandial blood glucose readings, individual ideal Hgb A1c  goals and hypoglycemia prevention. We discussed the importance of good blood sugar control to decrease the likelihood of diabetic complications such as nephropathy, neuropathy, limb loss, blindness, coronary artery disease, and death. We discussed the importance of intensive lifestyle modification including diet, exercise and weight loss as the first line treatment for diabetes. Brandon [name] was strongly encouraged to find ways to remember his medications and continue to work on diet and exercise. Brandon [name] agrees to follow up with our clinic in 3 weeks.  I spent > than 50% of the 15 minute visit on counseling as documented in the note.  Obesity Brandon [name] is not currently in the action stage of change. As such, his goal is to maintain weight for now, will follow up and see if he is in the action stage of change in 3 weeks. He has agreed to portion control better and make smarter food choices, such as increase vegetables and decrease simple carbohydrates  Brandon [name] has been instructed to work up to a goal of 150 minutes of combined cardio and strengthening exercise per week for weight loss and overall health benefits. We discussed the following Behavioral Modification Strategies today: work on meal planning and easy cooking plans and emotional eating strategies, and no skipping meals   Brandon [name] has agreed to follow up with our clinic in 3 weeks. He was informed of the importance of frequent follow up visits to maximize his success with intensive lifestyle modifications for his multiple health conditions.  ALLERGIES: Allergies  Allergen Reactions  . Amoxicillin Anaphylaxis    Has patient had  a PCN reaction causing immediate rash, facial/tongue/throat swelling, SOB or lightheadedness with hypotension: Yes Has patient had a PCN reaction causing severe rash involving mucus membranes or skin necrosis: No Has patient had a PCN reaction that required hospitalization: No Has patient had a PCN reaction occurring  within the last 10 years: No If all of the above answers are "NO", then may proceed with Cephalosporin use.   Brandon [name] Penicillin G Anaphylaxis    Gets extremely high fevers. Has patient had a PCN reaction causing immediate rash, facial/tongue/throat swelling, SOB or lightheadedness with hypotension: Yes Has patient had a PCN reaction causing severe rash involving mucus membranes or skin necrosis: No Has patient had a PCN reaction that required hospitalization: No Has patient had a PCN reaction occurring within the last 10 years: No If all of the above answers are "NO", then may proceed with Cephalosporin use.   . Sulfa Antibiotics Other (See Comments)    It will kill him  . Cephalosporins Other (See Comments)    Reaction unspecified.  . Quinolones Other (See Comments)    Reaction unspecified  . Sulfamethoxazole Swelling    MEDICATIONS: Current Outpatient Medications on File Prior to Visit  Medication Sig Dispense Refill  . atorvastatin (LIPITOR) 20 MG tablet TAKE 1 TABLET (20 MG TOTAL) BY MOUTH AT BEDTIME. (Patient taking differently: Take 20 mg by mouth at bedtime. ) 30 tablet 0  . colesevelam (WELCHOL) 625 MG tablet Take 3 tablets (1,875 mg total) by mouth daily with breakfast. 540 tablet 1  . cyclobenzaprine (FLEXERIL) 10 MG tablet Take 1 tablet (10 mg total) by mouth 3 (three) times daily as needed for muscle spasms. 30 tablet 0  . diazepam (VALIUM) 5 MG tablet Take 1 tablet (5 mg total) by mouth every 8 (eight) hours as needed for anxiety. (Patient taking differently: Take 5 mg by mouth at bedtime. ) 20 tablet 0  . docusate sodium (COLACE) 100 MG capsule Take 200 mg by mouth 2 (two) times daily. Taken at 08:00 and 20:00    . gabapentin (NEURONTIN) 300 MG capsule Take 3 capsules (900 mg total) by mouth 2 (two) times daily. 540 capsule 1  . Insulin Pen Needle (BD PEN NEEDLE NANO U/F) 32G X 4 MM MISC 1 Package by Does not apply route 2 (two) times daily. 100 each 0  . liraglutide (VICTOZA)  18 MG/3ML SOPN Inject 0.1 mLs (0.6 mg total) into the skin every morning. 1 pen 0  . lisinopril-hydrochlorothiazide (PRINZIDE,ZESTORETIC) 10-12.5 MG tablet Take 1 tablet by mouth at bedtime. 0000 90 tablet 1  . metFORMIN (GLUCOPHAGE-XR) 500 MG 24 hr tablet TAKE 2 TABLETS (1,000 MG TOTAL) BY MOUTH 2 (TWO) TIMES DAILY. (Patient taking differently: Take 1,000 mg by mouth 2 (two) times daily. TAKE 2 TABLETS (1,000 MG TOTAL) BY MOUTH 2 (TWO) TIMES DAILY.) 360 tablet 1  . NON FORMULARY CPAP machine at bedtime & sleep    . Vitamin D, Ergocalciferol, (DRISDOL) 1.25 MG (50000 UT) CAPS capsule Take 1 capsule (50,000 Units total) by mouth every 7 (seven) days. 4 capsule 0   No current facility-administered medications on file prior to visit.     PAST MEDICAL HISTORY: Past Medical History:  Diagnosis Date  . ADEM (acute disseminated encephalomyelitis)   . Allergy   . Anxiety 2005  . Complication of anesthesia   . Constipation   . Depression 2005  . Diabetes mellitus without complication (HCC)    Type 2  . Difficult intubation    difficult  intubating and exbutating "throat Clinched around tube" during an Endoscopy procedure  . Dyspnea    with exertion  . Headache(784.0)   . Hearing loss   . Hyperinsulinemia 07/04/11   "I'm on metformin cause I produce too much insulin"  . Hyperlipidemia   . Hypertension   . Mononucleosis, infectious, with hepatitis 06/2011  . MS (multiple sclerosis) (HCC)    patinet has ADEM not MS per Patient  . Nausea    occasional per history form 08/18/11  . Neuromuscular disorder (HCC)   . Pneumonia 2010  . Sleep apnea    uses CPAP at home  . Stroke (HCC) 2000   TIA  . TIA (transient ischemic attack) 2000   denies residual  . TIA (transient ischemic attack)     PAST SURGICAL HISTORY: Past Surgical History:  Procedure Laterality Date  . ANTERIOR CERVICAL DECOMP/DISCECTOMY FUSION N/A 10/21/2016   Procedure: ANTERIOR CERVICAL DECOMPRESSION/DISCECTOMY FUSION,  INTERBODY PROSTHESIS,PLATE CERVICAL FOUR- CERVICAL FIVE, CERVICAL FIVE- CERVICAL SIX;  Surgeon: Tressie Stalker, MD;  Location: [facility] OR;  Service: Neurosurgery;  Laterality: N/A;  . ANTERIOR CERVICAL DECOMP/DISCECTOMY FUSION N/A 05/19/2018   Procedure: ANTERIOR CERVICAL DECOMPRESSION/DISCECTOMY FUSION, INTERBODY PROSTHESIS,PLATE/SCREWS CERVICAL SIX- CERVICAL SEVEN; EXPLORE FUSION;  Surgeon: Tressie Stalker, MD;  Location: [facility] OR;  Service: Neurosurgery;  Laterality: N/A;  . CHOLECYSTECTOMY  08/02/2011   Procedure: LAPAROSCOPIC CHOLECYSTECTOMY;  Surgeon: Shelly Rubenstein, MD;  Location: [facility] OR;  Service: General;;  . COLONOSCOPY  2003  . ESOPHAGOGASTRODUODENOSCOPY  2003  . ESOPHAGOGASTRODUODENOSCOPY  07/28/2011   Procedure: ESOPHAGOGASTRODUODENOSCOPY (EGD);  Surgeon: [name], MD;  Location: [facility] ENDOSCOPY;  Service: Endoscopy;  Laterality: N/A;  . MYRINGOTOMY  1979; 1980   bilaterally  . TONSILLECTOMY AND ADENOIDECTOMY  ~ 1980    SOCIAL HISTORY: Social History   Tobacco Use  . Smoking status: Former Smoker    Packs/day: 3.00    Years: 3.00    Pack years: 9.00    Types: Cigarettes    Quit date: 08/18/1987    Years since quitting: 31.3  . Smokeless tobacco: Never Used  Substance Use Topics  . Alcohol use: Not Currently    Alcohol/week: 0.0 standard drinks    Comment: 07/04/11 "glass of wine or spirits once a month"  . Drug use: Not Currently    Types: Marijuana    Comment: have not used in a 1.5 years    FAMILY HISTORY: Family History  Problem Relation Age of Onset  . Coronary artery disease Father   . Stroke Father   . Cancer Father   . High blood pressure Father   . Alcoholism Father   . Diabetes Mother   . Obesity Mother   . Cancer Maternal Grandmother        bone  . Cancer Other        Stomach -Great-great grandmother    ROS: Review of Systems  Constitutional: Positive for malaise/fatigue. Negative for weight loss.  Endo/Heme/Allergies:       Negative hypoglycemia     PHYSICAL EXAM: Pt in no acute distress  RECENT LABS AND TESTS: BMET    Component Value Date/Time   NA 144 09/26/2018 1126   K 4.0 09/26/2018 1126   CL 98 09/26/2018 1126   CO2 26 09/26/2018 1126   GLUCOSE 85 09/26/2018 1126   GLUCOSE 83 05/13/2018 0957   BUN 17 09/26/2018 1126   CREATININE 1.09 09/26/2018 1126   CREATININE 1.02 08/24/2017 1458   CALCIUM 10.2 09/26/2018 1126   GFRNONAA 80  09/26/2018 1126   GFRNONAA >89 07/18/2015 1656   GFRAA 93 09/26/2018 1126   GFRAA >89 07/18/2015 1656   Lab Results  Component Value Date   HGBA1C 5.6 09/26/2018   HGBA1C 4.9 05/19/2018   HGBA1C 5.0 05/13/2018   HGBA1C 5.5 02/16/2018   HGBA1C 5.7 (H) 09/15/2017   Lab Results  Component Value Date   INSULIN 76.8 (H) 09/26/2018   INSULIN 60.7 (H) 02/16/2018   INSULIN 110.6 (H) 09/15/2017   CBC    Component Value Date/Time   WBC 6.1 09/26/2018 1126   WBC 5.0 05/13/2018 0957   RBC 6.88 (H) 09/26/2018 1126   RBC 5.71 05/13/2018 0957   HGB 18.2 (H) 09/26/2018 1126   HCT 55.8 (H) 09/26/2018 1126   PLT 161 05/13/2018 0957   PLT 158 09/17/2016 1611   MCV 81 09/26/2018 1126   MCH 26.5 (L) 09/26/2018 1126   MCH 26.8 05/13/2018 0957   MCHC 32.6 09/26/2018 1126   MCHC 32.6 05/13/2018 0957   RDW 15.3 09/26/2018 1126   LYMPHSABS 2.2 09/26/2018 1126   MONOABS 0.5 12/05/2014 1538   EOSABS 0.1 09/26/2018 1126   BASOSABS 0.0 09/26/2018 1126   Iron/TIBC/Ferritin/ %Sat No results found for: IRON, TIBC, FERRITIN, IRONPCTSAT Lipid Panel     Component Value Date/Time   CHOL 121 09/26/2018 1126   TRIG 250 (H) 09/26/2018 1126   HDL 32 (L) 09/26/2018 1126   CHOLHDL 3.4 08/24/2017 1458   VLDL 70 (H) 10/15/2015 1112   LDLCALC 39 09/26/2018 1126   LDLCALC 57 08/24/2017 1458   Hepatic Function Panel     Component Value Date/Time   PROT 7.4 09/26/2018 1126   ALBUMIN 4.9 09/26/2018 1126   AST 38 09/26/2018 1126   ALT 93 (H) 09/26/2018 1126   ALKPHOS 70 09/26/2018 1126   BILITOT 0.6  09/26/2018 1126   BILIDIR 0.1 04/11/2015 1638   IBILI 0.6 04/11/2015 1638      Component Value Date/Time   TSH 2.200 09/26/2018 1126   TSH 2.080 09/15/2017 0902   TSH 3.190 09/17/2016 1611      I, Burt KnackSharon Martin, am acting as transcriptionist for Quillian Quincearen Hunter Pinkard, MD I have reviewed the above documentation for accuracy and completeness, and I agree with the above. -Quillian Quincearen Hykeem Ojeda, MD

## 2019-01-14 ENCOUNTER — Other Ambulatory Visit (INDEPENDENT_AMBULATORY_CARE_PROVIDER_SITE_OTHER): Payer: Self-pay | Admitting: Family Medicine

## 2019-01-14 DIAGNOSIS — E119 Type 2 diabetes mellitus without complications: Secondary | ICD-10-CM

## 2019-01-18 ENCOUNTER — Encounter (INDEPENDENT_AMBULATORY_CARE_PROVIDER_SITE_OTHER): Payer: Self-pay | Admitting: Family Medicine

## 2019-01-18 ENCOUNTER — Other Ambulatory Visit: Payer: Self-pay

## 2019-01-18 ENCOUNTER — Telehealth (INDEPENDENT_AMBULATORY_CARE_PROVIDER_SITE_OTHER): Payer: 59 | Admitting: Family Medicine

## 2019-01-18 DIAGNOSIS — Z6841 Body Mass Index (BMI) 40.0 and over, adult: Secondary | ICD-10-CM | POA: Diagnosis not present

## 2019-01-18 DIAGNOSIS — E559 Vitamin D deficiency, unspecified: Secondary | ICD-10-CM | POA: Diagnosis not present

## 2019-01-18 DIAGNOSIS — F418 Other specified anxiety disorders: Secondary | ICD-10-CM | POA: Diagnosis not present

## 2019-01-18 MED ORDER — VITAMIN D (ERGOCALCIFEROL) 1.25 MG (50000 UNIT) PO CAPS: 50000.0000 [IU] | ORAL_CAPSULE | ORAL | 0 refills | Status: DC

## 2019-01-19 NOTE — Progress Notes (Signed)
Office: [phone]  /  Fax: [phone] TeleHealth Visit:  Brandon [name] has verbally consented to this TeleHealth visit today. The patient is located at home, the provider is located at the [facility] office. The participants in this visit include the listed provider and patient. The visit was conducted today via telephone call (Doxy.me failed - changed to telephone call).  HPI:   Chief Complaint: OBESITY Brandon [name] is here to discuss his progress with his obesity treatment plan. He is on the Category 4 plan and is following his eating plan approximately 40% of the time. He states he is exercising 0 minutes 0 times per week. [name] continues to do well maintaining his weight. He has decreased motivation to follow a structured plan and feels his mood has decreased. He is not in the action stage of change. We were unable to weigh the patient today for this TeleHealth visit. He feels as if he has maintained his weight since his last visit. He has lost 18 lbs since starting treatment with [place].  Vitamin D deficiency Brandon [name] has a diagnosis of Vitamin D deficiency, which is not yet at goal. His last Vitamin D level was reported to be 33.3 on 09/26/2018. He is currently stable on prescription Vit D and denies nausea, vomiting or muscle weakness.  Depression with Anxiety Brandon [name] is struggling with emotional eating and using food for comfort to the extent that it is negatively impacting his health. He often snacks when he is not hungry. Brandon [name] sometimes feels he is out of control and then feels guilty that he made poor food choices. He has been working on behavior modification techniques to help reduce his emotional eating and has been somewhat successful. Brandon [name] notes his mood is decreased and is hypersomnolent, which has been worse in the last 2-3 months and worse since COVID-19 isolation. He reports forgetting to take his medications and is not working on his health. He shows no sign  of suicidal or homicidal ideations.  Depression screen Brandon [name] 2/9 03/25/2018 02/17/2018 09/15/2017 08/24/2017 07/08/2017  Decreased Interest 0 0 1 0 0  Down, Depressed, Hopeless 0 0 1 0 0  PHQ - 2 Score 0 0 2 0 0  Altered sleeping - - 2 - -  Tired, decreased energy - - 1 - -  Change in appetite - - 3 - -  Feeling bad or failure about yourself  - - 1 - -  Trouble concentrating - - 3 - -  Moving slowly or fidgety/restless - - 1 - -  Suicidal thoughts - - 0 - -  PHQ-9 Score - - 13 - -   ASSESSMENT AND PLAN:  Vitamin D deficiency - Plan: Vitamin D, Ergocalciferol, (DRISDOL) 1.25 MG (50000 UT) CAPS capsule  Depression with anxiety  Class 3 severe obesity with serious comorbidity and body mass index (BMI) of 40.0 to 44.9 in adult, unspecified obesity type (HCC)  PLAN:  Vitamin D Deficiency Brandon [name] was informed that low Vitamin D levels contributes to fatigue and are associated with obesity, breast, and colon cancer. He agrees to continue to take prescription Vit D @ 50,000 IU every week #4 with 0 refills and will follow-up for routine testing of Vitamin D, at least 2-3 times per year. He was informed of the risk of over-replacement of Vitamin D and agrees to not increase his dose unless he discusses this with [place] first. [name] agrees to follow-up with our clinic in 2 weeks.  Depression with Anxiety We discussed  behavior modification techniques today to help Brandon [name] deal with his emotional eating and depression. Brandon [name] is referred to Brandon [name] for evaluation.  I spent > than 50% of the 25 minute visit on counseling as documented in the note.  Obesity Brandon [name] is not currently in the action stage of change. As such, his goal is to maintain weight for now. He will follow-up in 2-3 weeks to assess his readiness to change. He has agreed to follow the Category 4 plan. Brandon [name] has been instructed to work up to a goal of 150 minutes of combined cardio and strengthening exercise per week for weight  loss and overall health benefits. We discussed the following Behavioral Modification Strategies today: work on meal planning and easy cooking plans, ways to avoid boredom eating, and emotional eating strategies.  Brandon [name] has agreed to follow-up with our clinic in 2 weeks. He was informed of the importance of frequent follow-up visits to maximize his success with intensive lifestyle modifications for his multiple health conditions.  ALLERGIES: Allergies  Allergen Reactions   Amoxicillin Anaphylaxis    Has patient had a PCN reaction causing immediate rash, facial/tongue/throat swelling, SOB or lightheadedness with hypotension: Yes Has patient had a PCN reaction causing severe rash involving mucus membranes or skin necrosis: No Has patient had a PCN reaction that required hospitalization: No Has patient had a PCN reaction occurring within the last 10 years: No If all of the above answers are "NO", then may proceed with Cephalosporin use.    Penicillin G Anaphylaxis    Gets extremely high fevers. Has patient had a PCN reaction causing immediate rash, facial/tongue/throat swelling, SOB or lightheadedness with hypotension: Yes Has patient had a PCN reaction causing severe rash involving mucus membranes or skin necrosis: No Has patient had a PCN reaction that required hospitalization: No Has patient had a PCN reaction occurring within the last 10 years: No If all of the above answers are "NO", then may proceed with Cephalosporin use.    Sulfa Antibiotics Other (See Comments)    It will kill him   Cephalosporins Other (See Comments)    Reaction unspecified.   Quinolones Other (See Comments)    Reaction unspecified   Sulfamethoxazole Swelling    MEDICATIONS: Current Outpatient Medications on File Prior to Visit  Medication Sig Dispense Refill   atorvastatin (LIPITOR) 20 MG tablet TAKE 1 TABLET (20 MG TOTAL) BY MOUTH AT BEDTIME. (Patient taking differently: Take 20 mg by mouth at  bedtime. ) 30 tablet 0   colesevelam (WELCHOL) 625 MG tablet Take 3 tablets (1,875 mg total) by mouth daily with breakfast. 540 tablet 1   cyclobenzaprine (FLEXERIL) 10 MG tablet Take 1 tablet (10 mg total) by mouth 3 (three) times daily as needed for muscle spasms. 30 tablet 0   diazepam (VALIUM) 5 MG tablet Take 1 tablet (5 mg total) by mouth every 8 (eight) hours as needed for anxiety. (Patient taking differently: Take 5 mg by mouth at bedtime. ) 20 tablet 0   docusate sodium (COLACE) 100 MG capsule Take 200 mg by mouth 2 (two) times daily. Taken at 08:00 and 20:00     gabapentin (NEURONTIN) 300 MG capsule Take 3 capsules (900 mg total) by mouth 2 (two) times daily. 540 capsule 1   Insulin Pen [name] (BD PEN [name] NANO U/F) 32G X 4 MM MISC 1 Package by Does not apply route 2 (two) times daily. 100 each 0   liraglutide (VICTOZA) 18 MG/3ML SOPN Inject 0.1 mLs (0.6 mg  total) into the skin every morning. 1 pen 0   lisinopril-hydrochlorothiazide (PRINZIDE,ZESTORETIC) 10-12.5 MG tablet Take 1 tablet by mouth at bedtime. 0000 90 tablet 1   metFORMIN (GLUCOPHAGE-XR) 500 MG 24 hr tablet TAKE 2 TABLETS (1,000 MG TOTAL) BY MOUTH 2 (TWO) TIMES DAILY. (Patient taking differently: Take 1,000 mg by mouth 2 (two) times daily. TAKE 2 TABLETS (1,000 MG TOTAL) BY MOUTH 2 (TWO) TIMES DAILY.) 360 tablet 1   NON FORMULARY CPAP machine at bedtime & sleep     No current facility-administered medications on file prior to visit.     PAST MEDICAL HISTORY: Past Medical History:  Diagnosis Date   ADEM (acute disseminated encephalomyelitis)    Allergy    Anxiety 1517   Complication of anesthesia    Constipation    Depression 2005   Diabetes mellitus without complication ([place])    Type 2   Difficult intubation    difficult intubating and exbutating "throat Clinched around tube" during an Endoscopy procedure   Dyspnea    with exertion   Headache(784.0)    Hearing loss    Hyperinsulinemia  07/04/11   "I'm on metformin cause I produce too much insulin"   Hyperlipidemia    Hypertension    Mononucleosis, infectious, with hepatitis 06/2011   MS (multiple sclerosis) (HCC)    patinet has ADEM not MS per Patient   Nausea    occasional per history form 08/18/11   Neuromuscular disorder ([place])    Pneumonia 2010   Sleep apnea    uses CPAP at home   Stroke [name]) 2000   TIA   TIA (transient ischemic attack) 2000   denies residual   TIA (transient ischemic attack)     PAST SURGICAL HISTORY: Past Surgical History:  Procedure Laterality Date   ANTERIOR CERVICAL DECOMP/DISCECTOMY FUSION N/A 10/21/2016   Procedure: ANTERIOR CERVICAL DECOMPRESSION/DISCECTOMY FUSION, INTERBODY PROSTHESIS,PLATE CERVICAL FOUR- CERVICAL FIVE, CERVICAL FIVE- CERVICAL SIX;  Surgeon: Newman Pies, MD;  Location: [place];  Service: Neurosurgery;  Laterality: N/A;   ANTERIOR CERVICAL DECOMP/DISCECTOMY FUSION N/A 05/19/2018   Procedure: ANTERIOR CERVICAL DECOMPRESSION/DISCECTOMY FUSION, INTERBODY PROSTHESIS,PLATE/SCREWS CERVICAL SIX- CERVICAL SEVEN; EXPLORE FUSION;  Surgeon: Newman Pies, MD;  Location: [place];  Service: Neurosurgery;  Laterality: N/A;   CHOLECYSTECTOMY  08/02/2011   Procedure: LAPAROSCOPIC CHOLECYSTECTOMY;  Surgeon: Harl Bowie, MD;  Location: [facility];  Service: General;;   COLONOSCOPY  2003   ESOPHAGOGASTRODUODENOSCOPY  2003   ESOPHAGOGASTRODUODENOSCOPY  07/28/2011   Procedure: ESOPHAGOGASTRODUODENOSCOPY (EGD);  Surgeon: Scarlette Shorts, MD;  Location: [facility] ENDOSCOPY;  Service: Endoscopy;  Laterality: N/A;   MYRINGOTOMY  1979; 1980   bilaterally   TONSILLECTOMY AND ADENOIDECTOMY  ~ 1980    SOCIAL HISTORY: Social History   Tobacco Use   Smoking status: Former Smoker    Packs/day: 3.00    Years: 3.00    Pack years: 9.00    Types: Cigarettes    Quit date: 08/18/1987    Years since quitting: 31.4   Smokeless tobacco: Never Used  Substance Use Topics   Alcohol  use: Not Currently    Alcohol/week: 0.0 standard drinks    Comment: 07/04/11 "glass of wine or spirits once a month"   Drug use: Not Currently    Types: Marijuana    Comment: have not used in a 1.5 years    FAMILY HISTORY: Family History  Problem Relation Age of Onset   Coronary artery disease Father    Stroke Father    Cancer Father  High blood pressure Father    Alcoholism Father    Diabetes Mother    Obesity Mother    Cancer Maternal Grandmother        bone   Cancer Other        Stomach -Great-great grandmother   ROS: Review of Systems  Gastrointestinal: Negative for nausea and vomiting.  Musculoskeletal:       Negative for muscle weakness.  Psychiatric/Behavioral: Positive for depression (with anxiety). Negative for suicidal ideas.       Negative for homicidal ideas.   PHYSICAL EXAM: Pt in no acute distress  RECENT LABS AND TESTS: BMET    Component Value Date/Time   NA 144 09/26/2018 1126   K 4.0 09/26/2018 1126   CL 98 09/26/2018 1126   CO2 26 09/26/2018 1126   GLUCOSE 85 09/26/2018 1126   GLUCOSE 83 05/13/2018 0957   BUN 17 09/26/2018 1126   CREATININE 1.09 09/26/2018 1126   CREATININE 1.02 08/24/2017 1458   CALCIUM 10.2 09/26/2018 1126   GFRNONAA 80 09/26/2018 1126   GFRNONAA >89 07/18/2015 1656   GFRAA 93 09/26/2018 1126   GFRAA >89 07/18/2015 1656   Lab Results  Component Value Date   HGBA1C 5.6 09/26/2018   HGBA1C 4.9 05/19/2018   HGBA1C 5.0 05/13/2018   HGBA1C 5.5 02/16/2018   HGBA1C 5.7 (H) 09/15/2017   Lab Results  Component Value Date   INSULIN 76.8 (H) 09/26/2018   INSULIN 60.7 (H) 02/16/2018   INSULIN 110.6 (H) 09/15/2017   CBC    Component Value Date/Time   WBC 6.1 09/26/2018 1126   WBC 5.0 05/13/2018 0957   RBC 6.88 (H) 09/26/2018 1126   RBC 5.71 05/13/2018 0957   HGB 18.2 (H) 09/26/2018 1126   HCT 55.8 (H) 09/26/2018 1126   PLT 161 05/13/2018 0957   PLT 158 09/17/2016 1611   MCV 81 09/26/2018 1126   MCH 26.5  (L) 09/26/2018 1126   MCH 26.8 05/13/2018 0957   MCHC 32.6 09/26/2018 1126   MCHC 32.6 05/13/2018 0957   RDW 15.3 09/26/2018 1126   LYMPHSABS 2.2 09/26/2018 1126   MONOABS 0.5 12/05/2014 1538   EOSABS 0.1 09/26/2018 1126   BASOSABS 0.0 09/26/2018 1126   Iron/TIBC/Ferritin/ %Sat No results found for: IRON, TIBC, FERRITIN, IRONPCTSAT Lipid Panel     Component Value Date/Time   CHOL 121 09/26/2018 1126   TRIG 250 (H) 09/26/2018 1126   HDL 32 (L) 09/26/2018 1126   CHOLHDL 3.4 08/24/2017 1458   VLDL 70 (H) 10/15/2015 1112   LDLCALC 39 09/26/2018 1126   LDLCALC 57 08/24/2017 1458   Hepatic Function Panel     Component Value Date/Time   PROT 7.4 09/26/2018 1126   ALBUMIN 4.9 09/26/2018 1126   AST 38 09/26/2018 1126   ALT 93 (H) 09/26/2018 1126   ALKPHOS 70 09/26/2018 1126   BILITOT 0.6 09/26/2018 1126   BILIDIR 0.1 04/11/2015 1638   IBILI 0.6 04/11/2015 1638      Component Value Date/Time   TSH 2.200 09/26/2018 1126   TSH 2.080 09/15/2017 0902   TSH 3.190 09/17/2016 1611   Results for Dahlia ByesSOTO, Emersen F "[name]" (MRN [id number]) as of 01/19/2019 11:25  Ref. Range 09/26/2018 11:26  Vitamin D, 25-Hydroxy Latest Ref Range: 30.0 - 100.0 ng/mL 33.3   I, Marianna Paymentenise Haag, am acting as Energy managertranscriptionist for Quillian Quincearen Coulton Schlink, MD I have reviewed the above documentation for accuracy and completeness, and I agree with the above. -Quillian Quincearen Jhada Risk, MD

## 2019-01-30 ENCOUNTER — Other Ambulatory Visit: Payer: Self-pay

## 2019-01-30 ENCOUNTER — Encounter (INDEPENDENT_AMBULATORY_CARE_PROVIDER_SITE_OTHER): Payer: Self-pay | Admitting: Family Medicine

## 2019-01-30 ENCOUNTER — Ambulatory Visit (INDEPENDENT_AMBULATORY_CARE_PROVIDER_SITE_OTHER): Payer: 59 | Admitting: Psychology

## 2019-01-30 DIAGNOSIS — F418 Other specified anxiety disorders: Secondary | ICD-10-CM

## 2019-01-30 DIAGNOSIS — F5081 Binge eating disorder: Secondary | ICD-10-CM

## 2019-01-30 DIAGNOSIS — F3289 Other specified depressive episodes: Secondary | ICD-10-CM | POA: Diagnosis not present
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
## 2019-02-01 ENCOUNTER — Encounter (INDEPENDENT_AMBULATORY_CARE_PROVIDER_SITE_OTHER): Payer: Self-pay | Admitting: Family Medicine

## 2019-02-01 ENCOUNTER — Telehealth (INDEPENDENT_AMBULATORY_CARE_PROVIDER_SITE_OTHER): Payer: 59 | Admitting: Family Medicine

## 2019-02-01 ENCOUNTER — Other Ambulatory Visit: Payer: Self-pay

## 2019-02-01 DIAGNOSIS — Z6841 Body Mass Index (BMI) 40.0 and over, adult: Secondary | ICD-10-CM

## 2019-02-01 DIAGNOSIS — E781 Pure hyperglyceridemia: Secondary | ICD-10-CM | POA: Diagnosis not present

## 2019-02-02 NOTE — Progress Notes (Signed)
Office: [phone]  /  Fax: [phone] TeleHealth Visit:  Brandon [name] has verbally consented to this TeleHealth visit today. The patient is located at home, the provider is located at the [employer] [facility] office. The participants in this visit include the listed provider and patient and any and all parties involved. The visit was conducted today via WebEx.  HPI:   Chief Complaint: OBESITY Brandon [name] is here to discuss his progress with his obesity treatment plan. He is on the Category 4 plan and is following his eating plan approximately 20 % of the time. He states he is exercising 0 minutes 0 times per week. Brandon [name] states he has done well maintaining his weight, but he is deviating from his plan significantly. He has struggled with the pandemic isolation, and he still struggles with motivation to change. We were unable to weigh the patient today for this TeleHealth visit. He feels as if he has maintained weight since his last visit. He has lost 18 lbs since starting treatment with [place].  Elevated Triglycerides Brandon [name] is working on decreasing simple carbohydrates in his diet. His last triglycerides were at 250. Brandon [name] denies abdominal pain.  ASSESSMENT AND PLAN:  Hypertriglyceridemia  Class 3 severe obesity with serious comorbidity and body mass index (BMI) of 40.0 to 44.9 in adult, unspecified obesity type (Brandon [name])  PLAN:  Elevated Triglycerides Brandon [name] is to continue with diet and we will check labs in one month. If his levels continue to elevate, we may need to start Tricor. Brandon [name] agrees to follow up with our clinic in 2 weeks.  I spent > than 50% of the 25 minute visit on counseling as documented in the note.  Obesity Brandon [name] is currently in the action stage of change. As such, his goal is to maintain weight for now He has agreed to follow the Category 4 plan Brandon [name] has been instructed to work up to a goal of 150 minutes of combined cardio and strengthening  exercise per week for weight loss and overall health benefits. We discussed the following Behavioral Modification Strategies today: decreasing simple carbohydrates  and emotional eating strategies  Brandon [name] has agreed to follow up with our clinic in 2 weeks. He was informed of the importance of frequent follow up visits to maximize his success with intensive lifestyle modifications for his multiple health conditions.  ALLERGIES: Allergies  Allergen Reactions  . Amoxicillin Anaphylaxis    Has patient had a PCN reaction causing immediate rash, facial/tongue/throat swelling, SOB or lightheadedness with hypotension: Yes Has patient had a PCN reaction causing severe rash involving mucus membranes or skin necrosis: No Has patient had a PCN reaction that required hospitalization: No Has patient had a PCN reaction occurring within the last 10 years: No If all of the above answers are "NO", then may proceed with Cephalosporin use.   [name] Penicillin G Anaphylaxis    Gets extremely high fevers. Has patient had a PCN reaction causing immediate rash, facial/tongue/throat swelling, SOB or lightheadedness with hypotension: Yes Has patient had a PCN reaction causing severe rash involving mucus membranes or skin necrosis: No Has patient had a PCN reaction that required hospitalization: No Has patient had a PCN reaction occurring within the last 10 years: No If all of the above answers are "NO", then may proceed with Cephalosporin use.   . Sulfa Antibiotics Other (See Comments)    It will kill him  . Cephalosporins Other (See Comments)    Reaction unspecified.  . Quinolones Other (See Comments)  Reaction unspecified  . Sulfamethoxazole Swelling    MEDICATIONS: Current Outpatient Medications on File Prior to Visit  Medication Sig Dispense Refill  . atorvastatin (LIPITOR) 20 MG tablet TAKE 1 TABLET (20 MG TOTAL) BY MOUTH AT BEDTIME. (Patient taking differently: Take 20 mg by mouth at bedtime. ) 30  tablet 0  . colesevelam (WELCHOL) 625 MG tablet Take 3 tablets (1,875 mg total) by mouth daily with breakfast. 540 tablet 1  . cyclobenzaprine (FLEXERIL) 10 MG tablet Take 1 tablet (10 mg total) by mouth 3 (three) times daily as needed for muscle spasms. 30 tablet 0  . diazepam (VALIUM) 5 MG tablet Take 1 tablet (5 mg total) by mouth every 8 (eight) hours as needed for anxiety. (Patient taking differently: Take 5 mg by mouth at bedtime. ) 20 tablet 0  . docusate sodium (COLACE) 100 MG capsule Take 200 mg by mouth 2 (two) times daily. Taken at 08:00 and 20:00    . gabapentin (NEURONTIN) 300 MG capsule Take 3 capsules (900 mg total) by mouth 2 (two) times daily. 540 capsule 1  . Insulin Pen Needle (BD PEN NEEDLE NANO U/F) 32G X 4 MM MISC 1 Package by Does not apply route 2 (two) times daily. 100 each 0  . liraglutide (VICTOZA) 18 MG/3ML SOPN Inject 0.1 mLs (0.6 mg total) into the skin every morning. 1 pen 0  . lisinopril-hydrochlorothiazide (PRINZIDE,ZESTORETIC) 10-12.5 MG tablet Take 1 tablet by mouth at bedtime. 0000 90 tablet 1  . metFORMIN (GLUCOPHAGE-XR) 500 MG 24 hr tablet TAKE 2 TABLETS (1,000 MG TOTAL) BY MOUTH 2 (TWO) TIMES DAILY. (Patient taking differently: Take 1,000 mg by mouth 2 (two) times daily. TAKE 2 TABLETS (1,000 MG TOTAL) BY MOUTH 2 (TWO) TIMES DAILY.) 360 tablet 1  . NON FORMULARY CPAP machine at bedtime & sleep    . Vitamin D, Ergocalciferol, (DRISDOL) 1.25 MG (50000 UT) CAPS capsule Take 1 capsule (50,000 Units total) by mouth every 7 (seven) days. 4 capsule 0   No current facility-administered medications on file prior to visit.     PAST MEDICAL HISTORY: Past Medical History:  Diagnosis Date  . ADEM (acute disseminated encephalomyelitis)   . Allergy   . Anxiety 2005  . Complication of anesthesia   . Constipation   . Depression 2005  . Diabetes mellitus without complication (HCC)    Type 2  . Difficult intubation    difficult intubating and exbutating "throat  Clinched around tube" during an Endoscopy procedure  . Dyspnea    with exertion  . Headache(784.0)   . Hearing loss   . Hyperinsulinemia 07/04/11   "I'm on metformin cause I produce too much insulin"  . Hyperlipidemia   . Hypertension   . Mononucleosis, infectious, with hepatitis 06/2011  . MS (multiple sclerosis) (HCC)    patinet has ADEM not MS per Patient  . Nausea    occasional per history form 08/18/11  . Neuromuscular disorder (HCC)   . Pneumonia 2010  . Sleep apnea    uses CPAP at home  . Stroke (HCC) 2000   TIA  . TIA (transient ischemic attack) 2000   denies residual  . TIA (transient ischemic attack)     PAST SURGICAL HISTORY: Past Surgical History:  Procedure Laterality Date  . ANTERIOR CERVICAL DECOMP/DISCECTOMY FUSION N/A 10/21/2016   Procedure: ANTERIOR CERVICAL DECOMPRESSION/DISCECTOMY FUSION, INTERBODY PROSTHESIS,PLATE CERVICAL FOUR- CERVICAL FIVE, CERVICAL FIVE- CERVICAL SIX;  Surgeon: Tressie StalkerJeffrey Jenkins, MD;  Location: [facility] OR;  Service: Neurosurgery;  Laterality: N/A;  .  ANTERIOR CERVICAL DECOMP/DISCECTOMY FUSION N/A 05/19/2018   Procedure: ANTERIOR CERVICAL DECOMPRESSION/DISCECTOMY FUSION, INTERBODY PROSTHESIS,PLATE/SCREWS CERVICAL SIX- CERVICAL SEVEN; EXPLORE FUSION;  Surgeon: Tressie Stalker, MD;  Location: [facility] OR;  Service: Neurosurgery;  Laterality: N/A;  . CHOLECYSTECTOMY  08/02/2011   Procedure: LAPAROSCOPIC CHOLECYSTECTOMY;  Surgeon: Shelly Rubenstein, MD;  Location: [facility] OR;  Service: General;;  . COLONOSCOPY  2003  . ESOPHAGOGASTRODUODENOSCOPY  2003  . ESOPHAGOGASTRODUODENOSCOPY  07/28/2011   Procedure: ESOPHAGOGASTRODUODENOSCOPY (EGD);  Surgeon: [name], MD;  Location[facility] ENDOSCOPY;  Service: Endoscopy;  Laterality: N/A;  . MYRINGOTOMY  1979; 1980   bilaterally  . TONSILLECTOMY AND ADENOIDECTOMY  ~ 1980    SOCIAL HISTORY: Social History   Tobacco Use  . Smoking status: Former Smoker    Packs/day: 3.00    Years: 3.00    Pack years: 9.00     Types: Cigarettes    Quit date: 08/18/1987    Years since quitting: 31.4  . Smokeless tobacco: Never Used  Substance Use Topics  . Alcohol use: Not Currently    Alcohol/week: 0.0 standard drinks    Comment: 07/04/11 "glass of wine or spirits once a month"  . Drug use: Not Currently    Types: Marijuana    Comment: have not used in a 1.5 years    FAMILY HISTORY: Family History  Problem Relation Age of Onset  . Coronary artery disease Father   . Stroke Father   . Cancer Father   . High blood pressure Father   . Alcoholism Father   . Diabetes Mother   . Obesity Mother   . Cancer Maternal Grandmother        bone  . Cancer Other        Stomach -Great-great grandmother    ROS: Review of Systems  Constitutional: Negative for weight loss.  Gastrointestinal: Negative for abdominal pain.    PHYSICAL EXAM: Pt in no acute distress  RECENT LABS AND TESTS: BMET    Component Value Date/Time   NA 144 09/26/2018 1126   K 4.0 09/26/2018 1126   CL 98 09/26/2018 1126   CO2 26 09/26/2018 1126   GLUCOSE 85 09/26/2018 1126   GLUCOSE 83 05/13/2018 0957   BUN 17 09/26/2018 1126   CREATININE 1.09 09/26/2018 1126   CREATININE 1.02 08/24/2017 1458   CALCIUM 10.2 09/26/2018 1126   GFRNONAA 80 09/26/2018 1126   GFRNONAA >89 07/18/2015 1656   GFRAA 93 09/26/2018 1126   GFRAA >89 07/18/2015 1656   Lab Results  Component Value Date   HGBA1C 5.6 09/26/2018   HGBA1C 4.9 05/19/2018   HGBA1C 5.0 05/13/2018   HGBA1C 5.5 02/16/2018   HGBA1C 5.7 (H) 09/15/2017   Lab Results  Component Value Date   INSULIN 76.8 (H) 09/26/2018   INSULIN 60.7 (H) 02/16/2018   INSULIN 110.6 (H) 09/15/2017   CBC    Component Value Date/Time   WBC 6.1 09/26/2018 1126   WBC 5.0 05/13/2018 0957   RBC 6.88 (H) 09/26/2018 1126   RBC 5.71 05/13/2018 0957   HGB 18.2 (H) 09/26/2018 1126   HCT 55.8 (H) 09/26/2018 1126   PLT 161 05/13/2018 0957   PLT 158 09/17/2016 1611   MCV 81 09/26/2018 1126   MCH 26.5  (L) 09/26/2018 1126   MCH 26.8 05/13/2018 0957   MCHC 32.6 09/26/2018 1126   MCHC 32.6 05/13/2018 0957   RDW 15.3 09/26/2018 1126   LYMPHSABS 2.2 09/26/2018 1126   MONOABS 0.5 12/05/2014 1538   EOSABS 0.1 09/26/2018  1126   BASOSABS 0.0 09/26/2018 1126   Iron/TIBC/Ferritin/ %Sat No results found for: IRON, TIBC, FERRITIN, IRONPCTSAT Lipid Panel     Component Value Date/Time   CHOL 121 09/26/2018 1126   TRIG 250 (H) 09/26/2018 1126   HDL 32 (L) 09/26/2018 1126   CHOLHDL 3.4 08/24/2017 1458   VLDL 70 (H) 10/15/2015 1112   LDLCALC 39 09/26/2018 1126   LDLCALC 57 08/24/2017 1458   Hepatic Function Panel     Component Value Date/Time   PROT 7.4 09/26/2018 1126   ALBUMIN 4.9 09/26/2018 1126   AST 38 09/26/2018 1126   ALT 93 (H) 09/26/2018 1126   ALKPHOS 70 09/26/2018 1126   BILITOT 0.6 09/26/2018 1126   BILIDIR 0.1 04/11/2015 1638   IBILI 0.6 04/11/2015 1638      Component Value Date/Time   TSH 2.200 09/26/2018 1126   TSH 2.080 09/15/2017 0902   TSH 3.190 09/17/2016 1611     Ref. Range 09/26/2018 11:26  Vitamin D, 25-Hydroxy Latest Ref Range: 30.0 - 100.0 ng/mL 33.3    I, [place] CraneJoanne Murray, am acting as Energy managertranscriptionist for Quillian Quincearen Akram Kissick, MD I have reviewed the above documentation for accuracy and completeness, and I agree with the above. -Quillian Quincearen Suhaas Agena, MD

## 2019-02-13 NOTE — Progress Notes (Unsigned)
Office: [phone]  /  Fax: [phone]    Date: February 14, 2019   Appointment Start Time:*** Duration:*** Provider: Glennie Isle, Psy.D. Type of Session: Individual Therapy  Location of Patient: *** Location of Provider: [facility] Type of Contact: Telepsychological Visit via [employer] WebEx   Session Content: Brandon [name] is a 48 y.o. male presenting via [place] for a follow-up appointment to address the previously established treatment goal of decreasing emotional eating. Today's appointment was a telepsychological visit, as this provider's clinic is seeing a limited number of patients for in-person visits due to COVID-19. Therapeutic services will resume to in-person appointments once deemed appropriate. Brandon [name] expressed understanding regarding the rationale for telepsychological services, and provided verbal consent for today's appointment. Prior to proceeding with today's appointment, Brandon [name]'s physical location at the time of this appointment was obtained. Brandon [name] reported he was at *** and provided the address. In the event of technical difficulties, Brandon [name] shared a phone number he could be reached at. Brandon [name] and this provider participated in today's telepsychological service. Also, Brandon [name] denied anyone else being present in the room or on the WebEx appointment ***.  This provider conducted a brief check-in and verbally administered the PHQ-9 and GAD-7. *** Brandon [name] was receptive to today's session as evidenced by openness to sharing, responsiveness to feedback, and ***.  Mental Status Examination:  Appearance: {Appearance:22431} Behavior: {Behavior:22445} Mood: {Teletherapy mood:22435} Affect: {Affect:22436} Speech: {Speech:22432} Eye Contact: {Eye Contact:22433} Psychomotor Activity: {Motor Activity:22434} Thought Process: {thought process:22448}  Content/Perceptual Disturbances: {disturbances:22451} Orientation: {Orientation:22437} Cognition/Sensorium:  {gbcognition:22449} Insight: {Insight:22446} Judgment: {Insight:22446}  Structured Assessment Results: The Patient Health Questionnaire-9 (PHQ-9) is a self-report measure that assesses symptoms and severity of depression over the course of the last two weeks. Brandon [name] obtained a score of *** suggesting {GBPHQ9SEVERITY:21752}. Brandon [name] finds the endorsed symptoms to be {gbphq9difficulty:21754}. Brandon interest or pleasure in doing things ***  Feeling down, depressed, or hopeless ***  Trouble falling or staying asleep, or sleeping too much ***  Feeling tired or having Brandon energy ***  Poor appetite or overeating ***  Feeling bad about yourself --- or that you are a failure or have let yourself or your family down ***  Trouble concentrating on things, such as reading the newspaper or watching television ***  Moving or speaking so slowly that other people could have noticed? Or the opposite --- being so fidgety or restless that you have been moving around a lot more than usual ***  Thoughts that you would be better off dead or hurting yourself in some way ***  PHQ-9 Score ***    The Generalized Anxiety Disorder-7 (GAD-7) is a brief self-report measure that assesses symptoms of anxiety over the course of the last two weeks. Brandon [name] obtained a score of *** suggesting {gbgad7severity:21753}. Brandon [name] finds the endorsed symptoms to be {gbphq9difficulty:21754}. Feeling nervous, anxious, on edge ***  Not being able to stop or control worrying ***  Worrying too much about different things ***  Trouble relaxing ***  Being so restless that it's hard to sit still ***  Becoming easily annoyed or irritable ***  Feeling afraid as if something awful might happen ***  GAD-7 Score ***   Interventions:  {Interventions:22172}  DSM-5 Diagnosis: 311 (F32.8) Other Specified Depressive Disorder, Emotional Eating Behaviors  Treatment Goal & Progress: During the initial appointment with this provider, the  following treatment goal was established: decrease emotional eating. Progress is limited, as [place] has just begun treatment with this provider; however, he is receptive to the  interaction and interventions and rapport is being established.   Plan: Brandon NeedleMichael continues to appear able and willing to participate as evidenced by engagement in reciprocal conversation, and asking questions for clarification as appropriate. The next appointment will be scheduled in {gbweeks:21758}, which will be via [employer] WebEx. Once this provider's office resumes in-person appointments and it is deemed appropriate, Brandon NeedleMichael will be notified. The next session will focus on reviewing learned skills, and working towards the established treatment goal.***

## 2019-02-14 ENCOUNTER — Ambulatory Visit (INDEPENDENT_AMBULATORY_CARE_PROVIDER_SITE_OTHER): Payer: Self-pay | Admitting: Psychology

## 2019-02-14 ENCOUNTER — Telehealth (INDEPENDENT_AMBULATORY_CARE_PROVIDER_SITE_OTHER): Payer: Self-pay | Admitting: Psychology

## 2019-02-14 NOTE — Telephone Encounter (Signed)
  Office: [phone]  /  Fax: [phone]  Date of Call: February 14, 2019  Time of Call: 2:32pm Provider: Glennie Isle, PsyD  CONTENT: This provider called Brandon [name] to check-in as he did not present for today's Webex appointment at 2:30pm. A HIPAA compliant voicemail was left requesting a call back. Of note, this provider stayed on the [facility] appointment for 7 minutes prior to signing off, which is 2 additional minutes past the clinic's 5 minute grace period policy.    PLAN: This provider will wait for [name] to call back. If deemed necessary, this provider or the provider's clinic will call [facility] again in approximately one week.

## 2019-02-15 ENCOUNTER — Other Ambulatory Visit: Payer: Self-pay

## 2019-02-15 ENCOUNTER — Telehealth (INDEPENDENT_AMBULATORY_CARE_PROVIDER_SITE_OTHER): Payer: 59 | Admitting: Family Medicine

## 2019-02-15 ENCOUNTER — Encounter (INDEPENDENT_AMBULATORY_CARE_PROVIDER_SITE_OTHER): Payer: Self-pay | Admitting: Family Medicine

## 2019-02-15 DIAGNOSIS — E119 Type 2 diabetes mellitus without complications: Secondary | ICD-10-CM | POA: Diagnosis not present

## 2019-02-15 DIAGNOSIS — Z6841 Body Mass Index (BMI) 40.0 and over, adult: Secondary | ICD-10-CM | POA: Diagnosis not present

## 2019-02-20 NOTE — Progress Notes (Signed)
Office: [phone]  /  Fax: [phone] TeleHealth Visit:  Brandon [name] has verbally consented to this TeleHealth visit today. The patient is located at home, the provider is located at the [facility] office. The participants in this visit include the listed provider and patient. The visit was conducted today via telephone call (Webex failed - changed to telephone call).  HPI:   Chief Complaint: OBESITY Brandon [name] is here to discuss his progress with his obesity treatment plan. He is on the Category 4 plan and is following his eating plan approximately 0% of the time. He states he is exercising 0 minutes 0 times per week. Brandon [name] is not following his plan well. He has eaten a lot of simple carbs lately and has increased stress eating. He has been working on dealing with his anxiety and depression, which has been affecting his eating.  We were unable to weigh the patient today for this TeleHealth visit. He is unsure if he has gained or lost weight since his last visit. He has lost 18 lbs since starting treatment with [place].  Diabetes II Brandon [name] has a diagnosis of diabetes type II and is on metformin and Victoza. Brandon [name] does not check his blood sugars at home. Last A1c was well controlled at 5.6 on 09/26/2018. He has been struggling to follow his eating plan lately due to depression.  ASSESSMENT AND PLAN:  Type 2 diabetes mellitus without complication, without long-term current use of insulin (HCC)  Class 3 severe obesity with serious comorbidity and body mass index (BMI) of 45.0 to 49.9 in adult, unspecified obesity type ([place])  PLAN:  Diabetes II Brandon [name] has been given extensive diabetes education by myself today including ideal fasting and post-prandial blood glucose readings, individual ideal HgA1c goals  and hypoglycemia prevention. We discussed the importance of good blood sugar control to decrease the likelihood of diabetic complications such as nephropathy, neuropathy,  limb loss, blindness, coronary artery disease, and death. We discussed the importance of intensive lifestyle modification including diet, exercise and weight loss as the first line treatment for diabetes. Brandon [name] will continue his medications and will work on getting back to his diet.  I spent > than 50% of the 15 minute visit on counseling as documented in the note.  Obesity Brandon [name] is not currently in the action stage of change. Will contact patient in 2 weeks to reassess his readiness to change. As such, his goal is to maintain weight for now. He has agreed to follow the Category 4 plan. Brandon [name] has been instructed to work up to a goal of 150 minutes of combined cardio and strengthening exercise per week for weight loss and overall health benefits. We discussed the following Behavioral Modification Strategies today: emotional eating strategies.  Brandon [name] has agreed to follow-up with our clinic in 2-3 weeks. He was informed of the importance of frequent follow-up visits to maximize his success with intensive lifestyle modifications for his multiple health conditions.  ALLERGIES: Allergies  Allergen Reactions  . Amoxicillin Anaphylaxis    Has patient had a PCN reaction causing immediate rash, facial/tongue/throat swelling, SOB or lightheadedness with hypotension: Yes Has patient had a PCN reaction causing severe rash involving mucus membranes or skin necrosis: No Has patient had a PCN reaction that required hospitalization: No Has patient had a PCN reaction occurring within the last 10 years: No If all of the above answers are "NO", then may proceed with Cephalosporin use.   [name] Penicillin G Anaphylaxis    Gets  extremely high fevers. Has patient had a PCN reaction causing immediate rash, facial/tongue/throat swelling, SOB or lightheadedness with hypotension: Yes Has patient had a PCN reaction causing severe rash involving mucus membranes or skin necrosis: No Has patient had a PCN reaction  that required hospitalization: No Has patient had a PCN reaction occurring within the last 10 years: No If all of the above answers are "NO", then may proceed with Cephalosporin use.   . Sulfa Antibiotics Other (See Comments)    It will kill him  . Cephalosporins Other (See Comments)    Reaction unspecified.  . Quinolones Other (See Comments)    Reaction unspecified  . Sulfamethoxazole Swelling    MEDICATIONS: Current Outpatient Medications on File Prior to Visit  Medication Sig Dispense Refill  . atorvastatin (LIPITOR) 20 MG tablet TAKE 1 TABLET (20 MG TOTAL) BY MOUTH AT BEDTIME. (Patient taking differently: Take 20 mg by mouth at bedtime. ) 30 tablet 0  . colesevelam (WELCHOL) 625 MG tablet Take 3 tablets (1,875 mg total) by mouth daily with breakfast. 540 tablet 1  . cyclobenzaprine (FLEXERIL) 10 MG tablet Take 1 tablet (10 mg total) by mouth 3 (three) times daily as needed for muscle spasms. 30 tablet 0  . diazepam (VALIUM) 5 MG tablet Take 1 tablet (5 mg total) by mouth every 8 (eight) hours as needed for anxiety. (Patient taking differently: Take 5 mg by mouth at bedtime. ) 20 tablet 0  . docusate sodium (COLACE) 100 MG capsule Take 200 mg by mouth 2 (two) times daily. Taken at 08:00 and 20:00    . gabapentin (NEURONTIN) 300 MG capsule Take 3 capsules (900 mg total) by mouth 2 (two) times daily. 540 capsule 1  . Insulin Pen Needle (BD PEN NEEDLE NANO U/F) 32G X 4 MM MISC 1 Package by Does not apply route 2 (two) times daily. 100 each 0  . liraglutide (VICTOZA) 18 MG/3ML SOPN Inject 0.1 mLs (0.6 mg total) into the skin every morning. 1 pen 0  . lisinopril-hydrochlorothiazide (PRINZIDE,ZESTORETIC) 10-12.5 MG tablet Take 1 tablet by mouth at bedtime. 0000 90 tablet 1  . metFORMIN (GLUCOPHAGE-XR) 500 MG 24 hr tablet TAKE 2 TABLETS (1,000 MG TOTAL) BY MOUTH 2 (TWO) TIMES DAILY. (Patient taking differently: Take 1,000 mg by mouth 2 (two) times daily. TAKE 2 TABLETS (1,000 MG TOTAL) BY MOUTH  2 (TWO) TIMES DAILY.) 360 tablet 1  . NON FORMULARY CPAP machine at bedtime & sleep    . Vitamin D, Ergocalciferol, (DRISDOL) 1.25 MG (50000 UT) CAPS capsule Take 1 capsule (50,000 Units total) by mouth every 7 (seven) days. 4 capsule 0   No current facility-administered medications on file prior to visit.     PAST MEDICAL HISTORY: Past Medical History:  Diagnosis Date  . ADEM (acute disseminated encephalomyelitis)   . Allergy   . Anxiety 2005  . Complication of anesthesia   . Constipation   . Depression 2005  . Diabetes mellitus without complication (HCC)    Type 2  . Difficult intubation    difficult intubating and exbutating "throat Clinched around tube" during an Endoscopy procedure  . Dyspnea    with exertion  . Headache(784.0)   . Hearing loss   . Hyperinsulinemia 07/04/11   "I'm on metformin cause I produce too much insulin"  . Hyperlipidemia   . Hypertension   . Mononucleosis, infectious, with hepatitis 06/2011  . MS (multiple sclerosis) (HCC)    patinet has ADEM not MS per Patient  . Nausea  occasional per history form 08/18/11  . Neuromuscular disorder (HCC)   . Pneumonia 2010  . Sleep apnea    uses CPAP at home  . Stroke (HCC) 2000   TIA  . TIA (transient ischemic attack) 2000   denies residual  . TIA (transient ischemic attack)     PAST SURGICAL HISTORY: Past Surgical History:  Procedure Laterality Date  . ANTERIOR CERVICAL DECOMP/DISCECTOMY FUSION N/A 10/21/2016   Procedure: ANTERIOR CERVICAL DECOMPRESSION/DISCECTOMY FUSION, INTERBODY PROSTHESIS,PLATE CERVICAL FOUR- CERVICAL FIVE, CERVICAL FIVE- CERVICAL SIX;  Surgeon: Tressie StalkerJeffrey Jenkins, MD;  Location: [facility] OR;  Service: Neurosurgery;  Laterality: N/A;  . ANTERIOR CERVICAL DECOMP/DISCECTOMY FUSION N/A 05/19/2018   Procedure: ANTERIOR CERVICAL DECOMPRESSION/DISCECTOMY FUSION, INTERBODY PROSTHESIS,PLATE/SCREWS CERVICAL SIX- CERVICAL SEVEN; EXPLORE FUSION;  Surgeon: Tressie StalkerJenkins, Jeffrey, MD;  [facility];   Service: Neurosurgery;  Laterality: N/A;  . CHOLECYSTECTOMY  08/02/2011   Procedure: LAPAROSCOPIC CHOLECYSTECTOMY;  Surgeon: Shelly Rubensteinouglas A Blackman, MD;  Location: [facility] OR;  Service: General;;  . COLONOSCOPY  2003  . ESOPHAGOGASTRODUODENOSCOPY  2003  . ESOPHAGOGASTRODUODENOSCOPY  07/28/2011   Procedure: ESOPHAGOGASTRODUODENOSCOPY (EGD);  Surgeon: Yancey FlemingsJohn Perry, MD;  [facility];  Service: Endoscopy;  Laterality: N/A;  . MYRINGOTOMY  1979; 1980   bilaterally  . TONSILLECTOMY AND ADENOIDECTOMY  ~ 1980    SOCIAL HISTORY: Social History   Tobacco Use  . Smoking status: Former Smoker    Packs/day: 3.00    Years: 3.00    Pack years: 9.00    Types: Cigarettes    Quit date: 08/18/1987    Years since quitting: 31.5  . Smokeless tobacco: Never Used  Substance Use Topics  . Alcohol use: Not Currently    Alcohol/week: 0.0 standard drinks    Comment: 07/04/11 "glass of wine or spirits once a month"  . Drug use: Not Currently    Types: Marijuana    Comment: have not used in a 1.5 years    FAMILY HISTORY: Family History  Problem Relation Age of Onset  . Coronary artery disease Father   . Stroke Father   . Cancer Father   . High blood pressure Father   . Alcoholism Father   . Diabetes Mother   . Obesity Mother   . Cancer Maternal Grandmother        bone  . Cancer Other        Stomach -Great-great grandmother   ROS: ROS none noted.  PHYSICAL EXAM: Pt in no acute distress.  RECENT LABS AND TESTS: BMET    Component Value Date/Time   NA 144 09/26/2018 1126   K 4.0 09/26/2018 1126   CL 98 09/26/2018 1126   CO2 26 09/26/2018 1126   GLUCOSE 85 09/26/2018 1126   GLUCOSE 83 05/13/2018 0957   BUN 17 09/26/2018 1126   CREATININE 1.09 09/26/2018 1126   CREATININE 1.02 08/24/2017 1458   CALCIUM 10.2 09/26/2018 1126   GFRNONAA 80 09/26/2018 1126   GFRNONAA >89 07/18/2015 1656   GFRAA 93 09/26/2018 1126   GFRAA >89 07/18/2015 1656   Lab Results  Component Value Date    HGBA1C 5.6 09/26/2018   HGBA1C 4.9 05/19/2018   HGBA1C 5.0 05/13/2018   HGBA1C 5.5 02/16/2018   HGBA1C 5.7 (H) 09/15/2017   Lab Results  Component Value Date   INSULIN 76.8 (H) 09/26/2018   INSULIN 60.7 (H) 02/16/2018   INSULIN 110.6 (H) 09/15/2017   CBC    Component Value Date/Time   WBC 6.1 09/26/2018 1126   WBC 5.0 05/13/2018 0957  RBC 6.88 (H) 09/26/2018 1126   RBC 5.71 05/13/2018 0957   HGB 18.2 (H) 09/26/2018 1126   HCT 55.8 (H) 09/26/2018 1126   PLT 161 05/13/2018 0957   PLT 158 09/17/2016 1611   MCV 81 09/26/2018 1126   MCH 26.5 (L) 09/26/2018 1126   MCH 26.8 05/13/2018 0957   MCHC 32.6 09/26/2018 1126   MCHC 32.6 05/13/2018 0957   RDW 15.3 09/26/2018 1126   LYMPHSABS 2.2 09/26/2018 1126   MONOABS 0.5 12/05/2014 1538   EOSABS 0.1 09/26/2018 1126   BASOSABS 0.0 09/26/2018 1126   Iron/TIBC/Ferritin/ %Sat No results found for: IRON, TIBC, FERRITIN, IRONPCTSAT Lipid Panel     Component Value Date/Time   CHOL 121 09/26/2018 1126   TRIG 250 (H) 09/26/2018 1126   HDL 32 (L) 09/26/2018 1126   CHOLHDL 3.4 08/24/2017 1458   VLDL 70 (H) 10/15/2015 1112   LDLCALC 39 09/26/2018 1126   LDLCALC 57 08/24/2017 1458   Hepatic Function Panel     Component Value Date/Time   PROT 7.4 09/26/2018 1126   ALBUMIN 4.9 09/26/2018 1126   AST 38 09/26/2018 1126   ALT 93 (H) 09/26/2018 1126   ALKPHOS 70 09/26/2018 1126   BILITOT 0.6 09/26/2018 1126   BILIDIR 0.1 04/11/2015 1638   IBILI 0.6 04/11/2015 1638      Component Value Date/Time   TSH 2.200 09/26/2018 1126   TSH 2.080 09/15/2017 0902   TSH 3.190 09/17/2016 1611   Results for Dahlia ByesSOTO, Veldon F "[facility]" (MRN [id number]) as of 02/20/2019 14:58  Ref. Range 09/26/2018 11:26  Vitamin D, 25-Hydroxy Latest Ref Range: 30.0 - 100.0 ng/mL 33.3   I, Marianna Paymentenise Haag, am acting as Energy managertranscriptionist for Brandon Quincearen [place], MD I have reviewed the above documentation for accuracy and completeness, and I agree with the above. -Brandon Quincearen  Yosgar Demirjian, MD

## 2019-03-02 ENCOUNTER — Telehealth (INDEPENDENT_AMBULATORY_CARE_PROVIDER_SITE_OTHER): Payer: 59 | Admitting: Family Medicine

## 2019-03-02 ENCOUNTER — Other Ambulatory Visit: Payer: Self-pay

## 2019-03-02 ENCOUNTER — Encounter (INDEPENDENT_AMBULATORY_CARE_PROVIDER_SITE_OTHER): Payer: Self-pay | Admitting: Family Medicine

## 2019-03-02 DIAGNOSIS — E66813 Obesity, class 3: Secondary | ICD-10-CM

## 2019-03-02 DIAGNOSIS — Z6841 Body Mass Index (BMI) 40.0 and over, adult: Secondary | ICD-10-CM

## 2019-03-02 DIAGNOSIS — F418 Other specified anxiety disorders: Secondary | ICD-10-CM

## 2019-03-06 NOTE — Progress Notes (Signed)
Office: [phone]  /  Fax: [phone] TeleHealth Visit:  Brandon ByesMichael F [name] has verbally consented to this TeleHealth visit today. The patient is located at home, the provider is located at the [facility] office. The participants in this visit include the listed provider and patient. The visit was conducted today via telephone call (Doxy failed - changed to telephone call).  HPI:   Chief Complaint: OBESITY Brandon [name] is here to discuss his progress with his obesity treatment plan. He is on the Category 4 plan + 500 calories and is following his eating plan approximately 20% of the time. He states he is exercising 0 minutes 0 times per week. Brandon [name] continues to gain weight and has gained all of his weight back. His sleep is irregular and he is very fatigued. He has struggled with motivation to improve his eating since the pandemic. We were unable to weigh the patient today for this TeleHealth visit. He states he weighs 315 lb today. He has lost 18 lbs since starting treatment with us.  Depression with emotional eating behaviors Brandon [name] is struggling with emotional eating and using food for comfort to the extent that it is negatively impacting his health. He often snacks when he is not hungry. Brandon [name] sometimes feels he is out of control and then feels guilty that he made poor food choices. He has been working on behavior modification techniques to help reduce his emotional eating and has been somewhat successful. Brandon [name]'s cat died last week and his mood, which was already low, has worsened. He is doing a lot of comfort eating and recognizes he needs to work on taking his medications again regularly as he has been neglecting his health. He shows no sign of suicidal or homicidal ideations.  [facility] 03/25/2018 02/17/2018 09/15/2017 08/24/2017 07/08/2017  Decreased Interest 0 0 1 0 0  Down, Depressed, Hopeless 0 0 1 0 0  PHQ - 2 Score 0 0 2 0 0  Altered sleeping - - 2 - -   Tired, decreased energy - - 1 - -  Change in appetite - - 3 - -  Feeling bad or failure about yourself  - - 1 - -  Trouble concentrating - - 3 - -  Moving slowly or fidgety/restless - - 1 - -  Suicidal thoughts - - 0 - -  PHQ-9 Score - - 13 - -   ASSESSMENT AND PLAN:  Depression with anxiety  Class 3 severe obesity with serious comorbidity and body mass index (BMI) of 45.0 to 49.9 in adult, unspecified obesity type (HCC)  PLAN:  Depression with Emotional Eating Behaviors 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  I spent > than 50% of the 25 minute visit on counseling as documented in the note.  Obesity Brandon [name] is not currently in the action stage of change. As such, his goal is to continue with weight loss efforts. He has agreed to keep a food journal with 2000 calories and 100+ grams of protein.  Brandon [name] agrees to move into the preparation stage of change and start himself on a routine of sleep and taking his medications. Will continue to follow. Brandon [name] has been instructed to work up to a goal of 150 minutes of combined cardio and strengthening exercise per week for weight loss and overall health benefits. We discussed the following Behavioral Modification Strategies today: work on meal planning and easy cooking plans, planning for success, and keep a  strict food journal.  Brandon [name] has agreed to follow-up with our clinic in 3 weeks. He was informed of the importance of frequent follow-up visits to maximize his success with intensive lifestyle modifications for his multiple health conditions.  ALLERGIES: Allergies  Allergen Reactions  . Amoxicillin Anaphylaxis    Has patient had a PCN reaction causing immediate rash, facial/tongue/throat swelling, SOB or lightheadedness with hypotension: Yes Has patient had a PCN reaction causing severe rash involving mucus membranes or skin necrosis: No Has patient had a PCN reaction that  required hospitalization: No Has patient had a PCN reaction occurring within the last 10 years: No If all of the above answers are "NO", then may proceed with Cephalosporin use.   [name]. Penicillin G Anaphylaxis    Gets extremely high fevers. Has patient had a PCN reaction causing immediate rash, facial/tongue/throat swelling, SOB or lightheadedness with hypotension: Yes Has patient had a PCN reaction causing severe rash involving mucus membranes or skin necrosis: No Has patient had a PCN reaction that required hospitalization: No Has patient had a PCN reaction occurring within the last 10 years: No If all of the above answers are "NO", then may proceed with Cephalosporin use.   . Sulfa Antibiotics Other (See Comments)    It will kill him  . Cephalosporins Other (See Comments)    Reaction unspecified.  . Quinolones Other (See Comments)    Reaction unspecified  . Sulfamethoxazole Swelling    MEDICATIONS: Current Outpatient Medications on File Prior to Visit  Medication Sig Dispense Refill  . atorvastatin (LIPITOR) 20 MG tablet TAKE 1 TABLET (20 MG TOTAL) BY MOUTH AT BEDTIME. (Patient taking differently: Take 20 mg by mouth at bedtime. ) 30 tablet 0  . colesevelam (WELCHOL) 625 MG tablet Take 3 tablets (1,875 mg total) by mouth daily with breakfast. 540 tablet 1  . cyclobenzaprine (FLEXERIL) 10 MG tablet Take 1 tablet (10 mg total) by mouth 3 (three) times daily as needed for muscle spasms. 30 tablet 0  . diazepam (VALIUM) 5 MG tablet Take 1 tablet (5 mg total) by mouth every 8 (eight) hours as needed for anxiety. (Patient taking differently: Take 5 mg by mouth at bedtime. ) 20 tablet 0  . docusate sodium (COLACE) 100 MG capsule Take 200 mg by mouth 2 (two) times daily. Taken at 08:00 and 20:00    . gabapentin (NEURONTIN) 300 MG capsule Take 3 capsules (900 mg total) by mouth 2 (two) times daily. 540 capsule 1  . Insulin Pen [name] (BD PEN [name] NANO U/F) 32G X 4 MM MISC 1 Package by Does not  apply route 2 (two) times daily. 100 each 0  . liraglutide (VICTOZA) 18 MG/3ML SOPN Inject 0.1 mLs (0.6 mg total) into the skin every morning. 1 pen 0  . lisinopril-hydrochlorothiazide (PRINZIDE,ZESTORETIC) 10-12.5 MG tablet Take 1 tablet by mouth at bedtime. 0000 90 tablet 1  . metFORMIN (GLUCOPHAGE-XR) 500 MG 24 hr tablet TAKE 2 TABLETS (1,000 MG TOTAL) BY MOUTH 2 (TWO) TIMES DAILY. (Patient taking differently: Take 1,000 mg by mouth 2 (two) times daily. TAKE 2 TABLETS (1,000 MG TOTAL) BY MOUTH 2 (TWO) TIMES DAILY.) 360 tablet 1  . NON FORMULARY CPAP machine at bedtime & sleep    . Vitamin D, Ergocalciferol, (DRISDOL) 1.25 MG (50000 UT) CAPS capsule Take 1 capsule (50,000 Units total) by mouth every 7 (seven) days. 4 capsule 0   No current facility-administered medications on file prior to visit.     PAST MEDICAL HISTORY: Past  Medical History:  Diagnosis Date  . ADEM (acute disseminated encephalomyelitis)   . Allergy   . Anxiety 2005  . Complication of anesthesia   . Constipation   . Depression 2005  . Diabetes mellitus without complication (HCC)    Type 2  . Difficult intubation    difficult intubating and exbutating "throat Clinched around tube" during an Endoscopy procedure  . Dyspnea    with exertion  . Headache(784.0)   . Hearing loss   . Hyperinsulinemia 07/04/11   "I'm on metformin cause I produce too much insulin"  . Hyperlipidemia   . Hypertension   . Mononucleosis, infectious, with hepatitis 06/2011  . MS (multiple sclerosis) ([place])    patinet has ADEM not MS per Patient  . Nausea    occasional per history form 08/18/11  . Neuromuscular disorder ([place])   . Pneumonia 2010  . Sleep apnea    uses CPAP at home  . Stroke ([place]) 2000   TIA  . TIA (transient ischemic attack) 2000   denies residual  . TIA (transient ischemic attack)     PAST SURGICAL HISTORY: Past Surgical History:  Procedure Laterality Date  . ANTERIOR CERVICAL DECOMP/DISCECTOMY FUSION N/A 10/21/2016    Procedure: ANTERIOR CERVICAL DECOMPRESSION/DISCECTOMY FUSION, INTERBODY PROSTHESIS,PLATE CERVICAL FOUR- CERVICAL FIVE, CERVICAL FIVE- CERVICAL SIX;  Surgeon: Newman Pies, MD;  Location: [place];  Service: Neurosurgery;  Laterality: N/A;  . ANTERIOR CERVICAL DECOMP/DISCECTOMY FUSION N/A 05/19/2018   Procedure: ANTERIOR CERVICAL DECOMPRESSION/DISCECTOMY FUSION, INTERBODY PROSTHESIS,PLATE/SCREWS CERVICAL SIX- CERVICAL SEVEN; EXPLORE FUSION;  Surgeon: Newman Pies, MD;  Location: [place];  Service: Neurosurgery;  Laterality: N/A;  . CHOLECYSTECTOMY  08/02/2011   Procedure: LAPAROSCOPIC CHOLECYSTECTOMY;  Surgeon: Harl Bowie, MD;  Location: [place];  Service: General;;  . COLONOSCOPY  2003  . ESOPHAGOGASTRODUODENOSCOPY  2003  . ESOPHAGOGASTRODUODENOSCOPY  07/28/2011   Procedure: ESOPHAGOGASTRODUODENOSCOPY (EGD);  Surgeon: Scarlette Shorts, MD;  Location[facility] ENDOSCOPY;  Service: Endoscopy;  Laterality: N/A;  . MYRINGOTOMY  1979; 1980   bilaterally  . TONSILLECTOMY AND ADENOIDECTOMY  ~ 1980    SOCIAL HISTORY: Social History   Tobacco Use  . Smoking status: Former Smoker    Packs/day: 3.00    Years: 3.00    Pack years: 9.00    Types: Cigarettes    Quit date: 08/18/1987    Years since quitting: 31.5  . Smokeless tobacco: Never Used  Substance Use Topics  . Alcohol use: Not Currently    Alcohol/week: 0.0 standard drinks    Comment: 07/04/11 "glass of wine or spirits once a month"  . Drug use: Not Currently    Types: Marijuana    Comment: have not used in a 1.5 years    FAMILY HISTORY: Family History  Problem Relation Age of Onset  . Coronary artery disease Father   . Stroke Father   . Cancer Father   . High blood pressure Father   . Alcoholism Father   . Diabetes Mother   . Obesity Mother   . Cancer Maternal Grandmother        bone  . Cancer Other        Stomach -Great-great grandmother   ROS: Review of Systems  Psychiatric/Behavioral: Positive for depression. Negative  for suicidal ideas.       Negative for homicidal ideas.   PHYSICAL EXAM: Pt in no acute distress  RECENT LABS AND TESTS: BMET    Component Value Date/Time   NA 144 09/26/2018 1126   K 4.0 09/26/2018 1126  CL 98 09/26/2018 1126   CO2 26 09/26/2018 1126   GLUCOSE 85 09/26/2018 1126   GLUCOSE 83 05/13/2018 0957   BUN 17 09/26/2018 1126   CREATININE 1.09 09/26/2018 1126   CREATININE 1.02 08/24/2017 1458   CALCIUM 10.2 09/26/2018 1126   GFRNONAA 80 09/26/2018 1126   GFRNONAA >89 07/18/2015 1656   GFRAA 93 09/26/2018 1126   GFRAA >89 07/18/2015 1656   Lab Results  Component Value Date   HGBA1C 5.6 09/26/2018   HGBA1C 4.9 05/19/2018   HGBA1C 5.0 05/13/2018   HGBA1C 5.5 02/16/2018   HGBA1C 5.7 (H) 09/15/2017   Lab Results  Component Value Date   INSULIN 76.8 (H) 09/26/2018   INSULIN 60.7 (H) 02/16/2018   INSULIN 110.6 (H) 09/15/2017   CBC    Component Value Date/Time   WBC 6.1 09/26/2018 1126   WBC 5.0 05/13/2018 0957   RBC 6.88 (H) 09/26/2018 1126   RBC 5.71 05/13/2018 0957   HGB 18.2 (H) 09/26/2018 1126   HCT 55.8 (H) 09/26/2018 1126   PLT 161 05/13/2018 0957   PLT 158 09/17/2016 1611   MCV 81 09/26/2018 1126   MCH 26.5 (L) 09/26/2018 1126   MCH 26.8 05/13/2018 0957   MCHC 32.6 09/26/2018 1126   MCHC 32.6 05/13/2018 0957   RDW 15.3 09/26/2018 1126   LYMPHSABS 2.2 09/26/2018 1126   MONOABS 0.5 12/05/2014 1538   EOSABS 0.1 09/26/2018 1126   BASOSABS 0.0 09/26/2018 1126   Iron/TIBC/Ferritin/ %Sat No results found for: IRON, TIBC, FERRITIN, IRONPCTSAT Lipid Panel     Component Value Date/Time   CHOL 121 09/26/2018 1126   TRIG 250 (H) 09/26/2018 1126   HDL 32 (L) 09/26/2018 1126   CHOLHDL 3.4 08/24/2017 1458   VLDL 70 (H) 10/15/2015 1112   LDLCALC 39 09/26/2018 1126   LDLCALC 57 08/24/2017 1458   Hepatic Function Panel     Component Value Date/Time   PROT 7.4 09/26/2018 1126   ALBUMIN 4.9 09/26/2018 1126   AST 38 09/26/2018 1126   ALT 93 (H)  09/26/2018 1126   ALKPHOS 70 09/26/2018 1126   BILITOT 0.6 09/26/2018 1126   BILIDIR 0.1 04/11/2015 1638   IBILI 0.6 04/11/2015 1638      Component Value Date/Time   TSH 2.200 09/26/2018 1126   TSH 2.080 09/15/2017 0902   TSH 3.190 09/17/2016 1611   Results for KELLEE, KNEECE "[facility]" (MRN [id number]) as of 03/06/2019 16:41  Ref. Range 09/26/2018 11:26  Vitamin D, 25-Hydroxy Latest Ref Range: 30.0 - 100.0 ng/mL 33.3   I, Marianna Payment, am acting as Energy manager for Quillian Quince, MD  I have reviewed the above documentation for accuracy and completeness, and I agree with the above. -Quillian Quince, MD

## 2019-03-15 NOTE — Progress Notes (Unsigned)
Office: [phone]  /  Fax: [phone]    Date:***   Time Seen:*** Duration:*** Provider: Glennie Isle, Psy.D. Type of Session: Individual Therapy  Type of Contact: Face-to-face  Informed Consent for In-Person Services During COVID-19:  During today's appointment, information about the decision to initiate in-person services in light of the YSAYT-01 public health crisis was discussed. Brandon [name] and this provider agreed to meet in person for some or all future appointments. If there is a resurgence of the pandemic or other health concerns arise, telepsychological services may be initiated and any related concerns will be discussed and an attempt to address them will be made. Brandon [name] verbally acknowledged understanding that if necessary, this provider may determine there is a need to initiate telepsychological services for everyone's well-being. Brandon [name] expressed understanding he may request to initiate telepsychological services, and that request will be respected as long as it is feasible and clinically appropriate. Regarding telepsychological services, Brandon [name] acknowledged he is ultimately responsible for understanding his insurance benefits as it relates to reimbursement of telepsychological services. Moreover, the risks for opting for in-person services was discussed. Brandon [name] verbally acknowledged understanding that by coming to the office, he is assuming the risk of exposure to the coronavirus or other public risk, and the risk may increase if Brandon [name] travels by Brandon [name], cab, or Brandon [name]. To obtain in-person services, Brandon [name] verbally agreed to taking certain precautions (e.g., screening prior to appointment; universal masking; social distancing of 6 feet; proper hand hygiene; no visitors) set forth by Brandon [name] to keep everyone safe from exposure, sickness, and possible death. This information was *** shared by front desk staff either at the time of scheduling and/or  during the check-in process. Brandon [name] expressed understanding that should he not adhere to these safeguards, it may result in starting/returning to a telepsychological service arrangement and/or the exploration of other options for treatment. Brandon [name] acknowledged understanding that Brandon [name] will follow the protocol set forth by Brandon [name] should a patient present with a fever or other symptoms or disclose recent exposure, which will include rescheduling the appointment. Furthermore, Brandon [name] acknowledged understanding that precautions may change if additional local, state or federal orders or guidelines are published. This provider also shared that if Brandon [name] tests positive for the coronavirus, this provider may be required to notify local health authorities that Brandon [name] was in the Brandon [name] clinic. Only minimum information necessary for data collection will be disclosed. This provider will follow Brandon [name]'s disclosure policy should this provider or staff test positive for the coronavirus. To avoid handling of paper/writing instruments and increasing likelihood of touching, verbal consent was obtained by Brandon [name] during today's appointment prior to proceeding. Brandon [name] provided verbal consent to proceed, and acknowledged understanding that by verbally consenting to proceed, he is agreeable to all information noted above.   Session Content: Brandon [name] is a 48 y.o. male presenting for a follow-up appointment to address the previously established treatment goal of decreasing emotional eating. The session was initiated with the administration of the PHQ-9 and GAD-7, as well as a brief check-in. *** Brandon [name] was receptive to today's session as evidenced by openness to sharing, responsiveness to feedback, and ***.  Mental Status Examination:  Appearance: {Appearance:22431} Behavior: {Behavior:22445} Mood: {Teletherapy mood:22435} Affect: {Affect:22436} Speech: {Speech:22432} Eye  Contact: {Eye Contact:22433} Psychomotor Activity: {Motor Activity:22434} Thought Process: {thought process:22448}  Content/Perceptual Disturbances: {disturbances:22451} Orientation: {Orientation:22437} Cognition/Sensorium: {gbcognition:22449} Insight: {Insight:22446} Judgment: {Insight:22446}  Structured Assessment Results: The Patient Health Questionnaire-9 (PHQ-9) is a self-report measure that assesses  symptoms and severity of depression over the course of the last two weeks. Brandon [name] obtained a score of *** suggesting {GBPHQ9SEVERITY:21752}. Brandon [name] finds the endorsed symptoms to be {gbphq9difficulty:21754}. Little interest or pleasure in doing things ***  Feeling down, depressed, or hopeless ***  Trouble falling or staying asleep, or sleeping too much ***  Feeling tired or having little energy ***  Poor appetite or overeating ***  Feeling bad about yourself --- or that you are a failure or have let yourself or your family down ***  Trouble concentrating on things, such as reading the newspaper or watching television ***  Moving or speaking so slowly that other people could have noticed? Or the opposite --- being so fidgety or restless that you have been moving around a lot more than usual ***  Thoughts that you would be better off dead or hurting yourself in some way ***  PHQ-9 Score ***    The Generalized Anxiety Disorder-7 (GAD-7) is a brief self-report measure that assesses symptoms of anxiety over the course of the last two weeks. Brandon [name] obtained a score of *** suggesting {gbgad7severity:21753}. Brandon [name] finds the endorsed symptoms to be {gbphq9difficulty:21754}. Feeling nervous, anxious, on edge ***  Not being able to stop or control worrying ***  Worrying too much about different things ***  Trouble relaxing ***  Being so restless that it's hard to sit still ***  Becoming easily annoyed or irritable ***  Feeling afraid as if something awful might happen ***  GAD-7 Score ***    Interventions:  {Interventions:22172}  DSM-5 Diagnosis: 311 (F32.8) Other Specified Depressive Disorder, Emotional Eating Behaviors  Treatment Goal & Progress: During the initial appointment with this provider, the following treatment goal was established: decrease emotional eating. Progress is limited, as Lamonta has just begun treatment with this provider; however, he is receptive to the interaction and interventions and rapport is being established.   Plan: Jewel continues to appear able and willing to participate as evidenced by engagement in reciprocal conversation, and asking questions for clarification as appropriate.*** The next appointment will be scheduled in {gbweeks:21758}. The next session will focus on reviewing learned skills, and working towards the established treatment goal.***

## 2019-03-22 ENCOUNTER — Encounter (INDEPENDENT_AMBULATORY_CARE_PROVIDER_SITE_OTHER): Payer: Self-pay | Admitting: Family Medicine

## 2019-03-22 ENCOUNTER — Other Ambulatory Visit: Payer: Self-pay

## 2019-03-22 ENCOUNTER — Telehealth (INDEPENDENT_AMBULATORY_CARE_PROVIDER_SITE_OTHER): Payer: 59 | Admitting: Family Medicine

## 2019-03-22 DIAGNOSIS — Z6841 Body Mass Index (BMI) 40.0 and over, adult: Secondary | ICD-10-CM | POA: Diagnosis not present

## 2019-03-22 DIAGNOSIS — F418 Other specified anxiety disorders: Secondary | ICD-10-CM | POA: Diagnosis not present

## 2019-03-22 DIAGNOSIS — E559 Vitamin D deficiency, unspecified: Secondary | ICD-10-CM

## 2019-03-22 MED ORDER — VITAMIN D (ERGOCALCIFEROL) 1.25 MG (50000 UNIT) PO CAPS: 50000.0000 [IU] | ORAL_CAPSULE | ORAL | 0 refills | Status: DC

## 2019-03-23 ENCOUNTER — Ambulatory Visit (INDEPENDENT_AMBULATORY_CARE_PROVIDER_SITE_OTHER): Payer: Medicare Other | Admitting: Psychology

## 2019-03-23 NOTE — Progress Notes (Signed)
Office: [phone]  /  Fax: [phone]    Date: March 27, 2019   Appointment Start Time: 3:01pm Duration: 36 minutes Provider: Lawerance Cruel, Psy.D. Type of Session: Individual Therapy  Location of Patient: Home Location of Provider: Provider's Home Type of Contact: Telepsychological Visit via [employer] Web[employer]- audio only  Session Content: Brandon [name] is a 48 y.o. male presenting via [employer] WebEx for a follow-up appointment to address the previously established treatment goal of decreasing emotional eating. Of note, Brandon [name] indicated he did not have video capabilities; therefore, today's appointment continued with audio only via WebEx on Brandon [name]'s end. Today's appointment was a telepsychological visit, as it is an option for appointments to reduce exposure to COVID-19. Brandon [name] expressed understanding regarding the rationale for telepsychological services, and provided verbal consent for today's appointment. Prior to proceeding with today's appointment, Brandon [name]'s physical location at the time of this appointment was obtained. Brandon [name] reported he was at home and provided the address. In the event of technical difficulties, Brandon [name] shared a phone number he could be reached at. Brandon [name] and this provider participated in today's telepsychological service. Also, Brandon [name] denied anyone else being present in the room or on the WebEx appointment.  This provider conducted a brief check-in. Brandon [name] shared difficulty with insurance coverage for pain medication. He disclosed taking a previously prescribed medication to assist with pain management. This provider recommended he speak with his prescribing provider before taking previously prescribed medications. He agreed. Brandon [name] described feeling upset about a "racist" individual and reported discussing the individual's identity on public forums by sharing information that was already publicly available. A risk assessment was completed. Brandon [name] denied experiencing  suicidal and homicidal ideation, plan, and intent since the last appointment with this provider. Brandon [name] further clarified he did not provide instructions to harm this individual and noted he is not "having violent thoughts." He further added, "I'm sharing news stories." No factors indicated there was imminent danger to the individual or that his actions facilitated harm to the individual. Regarding safety, he continues to acknowledge understanding regarding the importance of reaching out to trusted individuals and/or emergency resources if he is unable to ensure safety.   Regarding eating, Brandon [name] stated he is starting over with his initially prescribed structured meal plan. He noted he discussed the aforementioned with Brandon [name]. Psychoeducation regarding emotional versus physical hunger was provided. Brandon [name] was given a handout to utilize between now and the next appointment to increase awareness of hunger patterns and subsequent eating. Brandon [name] provided verbal consent during today's appointment for this provider to send the handout about hunger patterns via e-mail. Brandon [name] further shared an increase in stress. Thus, this provider inquired whether he reached out to his previous therapist to re-establish care. He indicated it was on his "to-do list" and agreed to call her to schedule an appointment within the week. This provider recommended he continue meeting with this provider until he is re-established with his previous therapist; Brandon [name] agreed. This provider also discussed the importance of consistency of appointments. Brandon [name] acknowledged understanding. Overall, Brandon [name] was receptive to today's session as evidenced by openness to sharing, responsiveness to feedback, and willingness to explore hunger patterns.  Mental Status Examination:  Appearance: unable to assess  Behavior: cooperative Mood: euthymic Affect: unable to fully assess Speech: normal in rate, volume, and tone Eye Contact: unable to  assess Psychomotor Activity: unable to assess Thought Process: linear, logical, and goal directed  Content/Perceptual Disturbances: denies suicidal and homicidal ideation, plan, and intent and no hallucinations, delusions, bizarre thinking or  behavior reported or observed Orientation: time, person, place and purpose of appointment Cognition/Sensorium: memory, attention, language, and fund of knowledge intact  Insight: fair Judgment: fair  Interventions:  Conducted a brief chart review Provided empathic reflections and validation Conducted a risk assessment Psychoeducation provided regarding physical versus emotional hunger Focused on rapport building Employed supportive psychotherapy interventions to facilitate reduced distress, and to improve coping skills with identified stressors  Psychoeducation provided regarding the importance of appointment consistency   DSM-5 Diagnosis: 311 (F32.8) Other Specified Depressive Disorder, Emotional Eating Behaviors  Treatment Goal & Progress: During the initial appointment with this provider, the following treatment goal was established: decrease emotional eating. Progress is limited, as Brandon [name] has just begun treatment with this provider; however, he is receptive to the interaction and interventions and rapport is being established.   Plan: Brandon [name] continues to appear able and willing to participate as evidenced by engagement in reciprocal conversation, and asking questions for clarification as appropriate. The next appointment will be scheduled in two weeks, which will be via [employer]. The next session will focus on reviewing hunger patterns and exploring triggers for emotional eating.

## 2019-03-27 ENCOUNTER — Other Ambulatory Visit: Payer: Self-pay

## 2019-03-27 ENCOUNTER — Ambulatory Visit (INDEPENDENT_AMBULATORY_CARE_PROVIDER_SITE_OTHER): Payer: 59 | Admitting: Psychology

## 2019-03-27 DIAGNOSIS — F3289 Other specified depressive episodes: Secondary | ICD-10-CM | POA: Diagnosis not present

## 2019-03-27 NOTE — Progress Notes (Signed)
Office: [phone]  /  Fax: [phone] TeleHealth Visit:  Brandon [name] has verbally consented to this TeleHealth visit today. The patient is located at home, the provider is located at the [facility] office. The participants in this visit include the listed provider and patient. The visit was conducted today via webex.  HPI:   Chief Complaint: OBESITY Brandon [name] is here to discuss his progress with his obesity treatment plan. He is on the keep a food journal with 2000 calories and 100+ grams of protein daily and is following his eating plan approximately 15 % of the time. He states he is exercising 5 minutes 3-4 times per week. Brandon [name] continues to struggle with following a structured plan. He didn't journal but tried to portion control even though he still is eating a lot of simple carbohydrates like cereal, juice, chips, etc. He knows he is not eating like he should but still trying to do better than he would if he wasn't trying at all. He thinks he lost 1 lb in the last 2 weeks.  We were unable to weigh the patient today for this TeleHealth visit. He feels as if he has 1.5 lbs since his last visit. He has lost 18 lbs since starting treatment with us.  Vitamin D Deficiency Brandon [name] has a diagnosis of vitamin D deficiency. He is stable on prescription Vit D, but level is not yet at goal. He denies nausea, vomiting or muscle weakness.  Depression with Anxiety Brandon [name] continues to struggle with anxiety, and stressing over world events. He is not sleeping well and is using a lot of full sugar [facility] to help stay awake. He shows no sign of suicidal or homicidal ideations.  ASSESSMENT AND PLAN:  Vitamin D deficiency - Plan: Vitamin D, Ergocalciferol, (DRISDOL) 1.25 MG (50000 UT) CAPS capsule  Depression with anxiety  Class 3 severe obesity with serious comorbidity and body mass index (BMI) of 45.0 to 49.9 in adult, unspecified obesity type (HCC)  PLAN:  Vitamin D  Deficiency Brandon [name] was informed that low vitamin D levels contributes to fatigue and are associated with obesity, breast, and colon cancer. Brandon [name] agrees to continue to taking prescription Vit D 50,000 IU every week #4 and we will refill for 1 month. He will follow up for routine testing of vitamin D, at least 2-3 times per year. He was informed of the risk of over-replacement of vitamin D and agrees to not increase his dose unless he discusses this with us first. Brandon [name] agrees to follow up with our clinic in 2 to 3 weeks.  Depression with Anxiety We discussed behavior modification techniques today to help Brandon [name] deal with his emotional eating and depression. Brandon [name] is to continue working with Dr. Dewaine CongerBarker, and he agrees to follow up with our clinic in 2 to 3 weeks.  Obesity Brandon [name] is currently in the action stage of change. As such, his goal is to continue with weight loss efforts He has agreed to portion control better and make smarter food choices, such as increase vegetables and decrease simple carbohydrates  Brandon [name] has been instructed to work up to a goal of 150 minutes of combined cardio and strengthening exercise per week for weight loss and overall health benefits. We discussed the following Behavioral Modification Strategies today: increasing lean protein intake and decreasing simple carbohydrates    Brandon [name] has agreed to follow up with our clinic in 2 to 3 weeks. He was informed of the importance of frequent follow up  visits to maximize his success with intensive lifestyle modifications for his multiple health conditions.  ALLERGIES: Allergies  Allergen Reactions  . Amoxicillin Anaphylaxis    Has patient had a PCN reaction causing immediate rash, facial/tongue/throat swelling, SOB or lightheadedness with hypotension: Yes Has patient had a PCN reaction causing severe rash involving mucus membranes or skin necrosis: No Has patient had a PCN reaction that required hospitalization: No  Has patient had a PCN reaction occurring within the last 10 years: No If all of the above answers are "NO", then may proceed with Cephalosporin use.   [name]. Penicillin G Anaphylaxis    Gets extremely high fevers. Has patient had a PCN reaction causing immediate rash, facial/tongue/throat swelling, SOB or lightheadedness with hypotension: Yes Has patient had a PCN reaction causing severe rash involving mucus membranes or skin necrosis: No Has patient had a PCN reaction that required hospitalization: No Has patient had a PCN reaction occurring within the last 10 years: No If all of the above answers are "NO", then may proceed with Cephalosporin use.   . Sulfa Antibiotics Other (See Comments)    It will kill him  . Cephalosporins Other (See Comments)    Reaction unspecified.  . Quinolones Other (See Comments)    Reaction unspecified  . Sulfamethoxazole Swelling    MEDICATIONS: Current Outpatient Medications on File Prior to Visit  Medication Sig Dispense Refill  . atorvastatin (LIPITOR) 20 MG tablet TAKE 1 TABLET (20 MG TOTAL) BY MOUTH AT BEDTIME. (Patient taking differently: Take 20 mg by mouth at bedtime. ) 30 tablet 0  . colesevelam (WELCHOL) 625 MG tablet Take 3 tablets (1,875 mg total) by mouth daily with breakfast. 540 tablet 1  . cyclobenzaprine (FLEXERIL) 10 MG tablet Take 1 tablet (10 mg total) by mouth 3 (three) times daily as needed for muscle spasms. 30 tablet 0  . diazepam (VALIUM) 5 MG tablet Take 1 tablet (5 mg total) by mouth every 8 (eight) hours as needed for anxiety. (Patient taking differently: Take 5 mg by mouth at bedtime. ) 20 tablet 0  . docusate sodium (COLACE) 100 MG capsule Take 200 mg by mouth 2 (two) times daily. Taken at 08:00 and 20:00    . gabapentin (NEURONTIN) 300 MG capsule Take 3 capsules (900 mg total) by mouth 2 (two) times daily. 540 capsule 1  . Insulin Pen Needle (BD PEN NEEDLE NANO U/F) 32G X 4 MM MISC 1 Package by Does not apply route 2 (two) times  daily. 100 each 0  . liraglutide (VICTOZA) 18 MG/3ML SOPN Inject 0.1 mLs (0.6 mg total) into the skin every morning. 1 pen 0  . lisinopril-hydrochlorothiazide (PRINZIDE,ZESTORETIC) 10-12.5 MG tablet Take 1 tablet by mouth at bedtime. 0000 90 tablet 1  . metFORMIN (GLUCOPHAGE-XR) 500 MG 24 hr tablet TAKE 2 TABLETS (1,000 MG TOTAL) BY MOUTH 2 (TWO) TIMES DAILY. (Patient taking differently: Take 1,000 mg by mouth 2 (two) times daily. TAKE 2 TABLETS (1,000 MG TOTAL) BY MOUTH 2 (TWO) TIMES DAILY.) 360 tablet 1  . NON FORMULARY CPAP machine at bedtime & sleep     No current facility-administered medications on file prior to visit.     PAST MEDICAL HISTORY: Past Medical History:  Diagnosis Date  . ADEM (acute disseminated encephalomyelitis)   . Allergy   . Anxiety 2005  . Complication of anesthesia   . Constipation   . Depression 2005  . Diabetes mellitus without complication (HCC)    Type 2  . Difficult intubation  difficult intubating and exbutating "throat Clinched around tube" during an Endoscopy procedure  . Dyspnea    with exertion  . Headache(784.0)   . Hearing loss   . Hyperinsulinemia 07/04/11   "I'm on metformin cause I produce too much insulin"  . Hyperlipidemia   . Hypertension   . Mononucleosis, infectious, with hepatitis 06/2011  . MS (multiple sclerosis) ([place])    patinet has ADEM not MS per Patient  . Nausea    occasional per history form 08/18/11  . Neuromuscular disorder ([place])   . Pneumonia 2010  . Sleep apnea    uses CPAP at home  . Stroke ([place]) 2000   TIA  . TIA (transient ischemic attack) 2000   denies residual  . TIA (transient ischemic attack)     PAST SURGICAL HISTORY: Past Surgical History:  Procedure Laterality Date  . ANTERIOR CERVICAL DECOMP/DISCECTOMY FUSION N/A 10/21/2016   Procedure: ANTERIOR CERVICAL DECOMPRESSION/DISCECTOMY FUSION, INTERBODY PROSTHESIS,PLATE CERVICAL FOUR- CERVICAL FIVE, CERVICAL FIVE- CERVICAL SIX;  Surgeon: Newman Pies,  MD;  Location: [facility];  Service: Neurosurgery;  Laterality: N/A;  . ANTERIOR CERVICAL DECOMP/DISCECTOMY FUSION N/A 05/19/2018   Procedure: ANTERIOR CERVICAL DECOMPRESSION/DISCECTOMY FUSION, INTERBODY PROSTHESIS,PLATE/SCREWS CERVICAL SIX- CERVICAL SEVEN; EXPLORE FUSION;  Surgeon: Newman Pies, MD;  Location: [facility];  Service: Neurosurgery;  Laterality: N/A;  . CHOLECYSTECTOMY  08/02/2011   Procedure: LAPAROSCOPIC CHOLECYSTECTOMY;  Surgeon: Harl Bowie, MD;  Location: [facility];  Service: General;;  . COLONOSCOPY  2003  . ESOPHAGOGASTRODUODENOSCOPY  2003  . ESOPHAGOGASTRODUODENOSCOPY  07/28/2011   Procedure: ESOPHAGOGASTRODUODENOSCOPY (EGD);  Surgeon: Scarlette Shorts, MD;  Location[facility] ENDOSCOPY;  Service: Endoscopy;  Laterality: N/A;  . MYRINGOTOMY  1979; 1980   bilaterally  . TONSILLECTOMY AND ADENOIDECTOMY  ~ 1980    SOCIAL HISTORY: Social History   Tobacco Use  . Smoking status: Former Smoker    Packs/day: 3.00    Years: 3.00    Pack years: 9.00    Types: Cigarettes    Quit date: 08/18/1987    Years since quitting: 31.6  . Smokeless tobacco: Never Used  Substance Use Topics  . Alcohol use: Not Currently    Alcohol/week: 0.0 standard drinks    Comment: 07/04/11 "glass of wine or spirits once a month"  . Drug use: Not Currently    Types: Marijuana    Comment: have not used in a 1.5 years    FAMILY HISTORY: Family History  Problem Relation Age of Onset  . Coronary artery disease Father   . Stroke Father   . Cancer Father   . High blood pressure Father   . Alcoholism Father   . Diabetes Mother   . Obesity Mother   . Cancer Maternal Grandmother        bone  . Cancer Other        Stomach -Great-great grandmother    ROS: Review of Systems  Constitutional: Positive for weight loss.  Gastrointestinal: Negative for nausea and vomiting.  Musculoskeletal:       Negative muscle weakness  Psychiatric/Behavioral: Positive for depression. Negative for suicidal ideas.       +  Anxiety    PHYSICAL EXAM: Pt in no acute distress  RECENT LABS AND TESTS: BMET    Component Value Date/Time   NA 144 09/26/2018 1126   K 4.0 09/26/2018 1126   CL 98 09/26/2018 1126   CO2 26 09/26/2018 1126   GLUCOSE 85 09/26/2018 1126   GLUCOSE 83 05/13/2018 0957   BUN 17 09/26/2018  1126   CREATININE 1.09 09/26/2018 1126   CREATININE 1.02 08/24/2017 1458   CALCIUM 10.2 09/26/2018 1126   GFRNONAA 80 09/26/2018 1126   GFRNONAA >89 07/18/2015 1656   GFRAA 93 09/26/2018 1126   GFRAA >89 07/18/2015 1656   Lab Results  Component Value Date   HGBA1C 5.6 09/26/2018   HGBA1C 4.9 05/19/2018   HGBA1C 5.0 05/13/2018   HGBA1C 5.5 02/16/2018   HGBA1C 5.7 (H) 09/15/2017   Lab Results  Component Value Date   INSULIN 76.8 (H) 09/26/2018   INSULIN 60.7 (H) 02/16/2018   INSULIN 110.6 (H) 09/15/2017   CBC    Component Value Date/Time   WBC 6.1 09/26/2018 1126   WBC 5.0 05/13/2018 0957   RBC 6.88 (H) 09/26/2018 1126   RBC 5.71 05/13/2018 0957   HGB 18.2 (H) 09/26/2018 1126   HCT 55.8 (H) 09/26/2018 1126   PLT 161 05/13/2018 0957   PLT 158 09/17/2016 1611   MCV 81 09/26/2018 1126   MCH 26.5 (L) 09/26/2018 1126   MCH 26.8 05/13/2018 0957   MCHC 32.6 09/26/2018 1126   MCHC 32.6 05/13/2018 0957   RDW 15.3 09/26/2018 1126   LYMPHSABS 2.2 09/26/2018 1126   MONOABS 0.5 12/05/2014 1538   EOSABS 0.1 09/26/2018 1126   BASOSABS 0.0 09/26/2018 1126   Iron/TIBC/Ferritin/ %Sat No results found for: IRON, TIBC, FERRITIN, IRONPCTSAT Lipid Panel     Component Value Date/Time   CHOL 121 09/26/2018 1126   TRIG 250 (H) 09/26/2018 1126   HDL 32 (L) 09/26/2018 1126   CHOLHDL 3.4 08/24/2017 1458   VLDL 70 (H) 10/15/2015 1112   LDLCALC 39 09/26/2018 1126   LDLCALC 57 08/24/2017 1458   Hepatic Function Panel     Component Value Date/Time   PROT 7.4 09/26/2018 1126   ALBUMIN 4.9 09/26/2018 1126   AST 38 09/26/2018 1126   ALT 93 (H) 09/26/2018 1126   ALKPHOS 70 09/26/2018 1126    BILITOT 0.6 09/26/2018 1126   BILIDIR 0.1 04/11/2015 1638   IBILI 0.6 04/11/2015 1638      Component Value Date/Time   TSH 2.200 09/26/2018 1126   TSH 2.080 09/15/2017 0902   TSH 3.190 09/17/2016 1611      I, Burt Knack, am acting as transcriptionist for Quillian Quince, MD I have reviewed the above documentation for accuracy and completeness, and I agree with the above. -Quillian Quince, MD

## 2019-04-06 DIAGNOSIS — M542 Cervicalgia: Secondary | ICD-10-CM | POA: Diagnosis not present

## 2019-04-06 DIAGNOSIS — I10 Essential (primary) hypertension: Secondary | ICD-10-CM | POA: Diagnosis not present

## 2019-04-06 DIAGNOSIS — M25562 Pain in left knee: Secondary | ICD-10-CM | POA: Diagnosis not present

## 2019-04-06 DIAGNOSIS — F321 Major depressive disorder, single episode, moderate: Secondary | ICD-10-CM | POA: Diagnosis not present

## 2019-04-06 DIAGNOSIS — R635 Abnormal weight gain: Secondary | ICD-10-CM | POA: Diagnosis not present

## 2019-04-06 DIAGNOSIS — E7849 Other hyperlipidemia: Secondary | ICD-10-CM | POA: Diagnosis not present

## 2019-04-06 DIAGNOSIS — N521 Erectile dysfunction due to diseases classified elsewhere: Secondary | ICD-10-CM | POA: Diagnosis not present

## 2019-04-06 DIAGNOSIS — G8929 Other chronic pain: Secondary | ICD-10-CM | POA: Diagnosis not present

## 2019-04-06 DIAGNOSIS — Z23 Encounter for immunization: Secondary | ICD-10-CM | POA: Diagnosis not present

## 2019-04-06 DIAGNOSIS — G6289 Other specified polyneuropathies: Secondary | ICD-10-CM | POA: Diagnosis not present

## 2019-04-10 NOTE — Progress Notes (Addendum)
Office: [phone]  /  Fax: [phone]    Date: April 11, 2019   Appointment Start Time: 11:34am Duration: 31 minutes Provider: Glennie Isle, Psy.D. Type of Session: Individual Therapy  Location of Patient: Home Location of Provider: [facility] Type of Contact: Telepsychological Visit via [employer] WebEx- audio only  Session Content: Of note, this provider called Tru at 11:32am as he did not present for the [facility] appointment. He indicated he forgot about today's appointment, but shared he can meet for today's appointment. The e-mail with the secure link was re-sent. As such, today's appointment was initiated 4 minutes late. Brandon [name] is a 48 y.o. male presenting via [facility] (audio only) for a follow-up appointment to address the previously established treatment goal of decreasing emotional eating. Today's appointment was a telepsychological visit, as it is an option for appointments to reduce exposure to COVID-19. Brandon [name] expressed understanding regarding the rationale for telepsychological services, and provided verbal consent for today's appointment. Prior to proceeding with today's appointment, Brandon [name]'s physical location at the time of this appointment was obtained. Keiden reported he was at home and provided the address. In the event of technical difficulties, Erasmus shared a phone number he could be reached at. Legrand Como and this provider participated in today's telepsychological service. Also, Delbert denied anyone else being present in the room or on the WebEx appointment. Of note, this provider recommended the next appointment be in person as [name] does not have video capabilities at this time. He was agreeable.   This provider conducted a brief check-in and verbally administered the PHQ-9 and GAD-7. Brandon [name] shared he is "still trying to get a hold of Brandon [name]." He noted a plan to speak with a family member to establish care with Brandon [name] or a new provider in her  office. This provider shared the option again of this provider placing a referral for longer-term therapeutic services. He declined and noted, "Let me try one more time." In addition, Brandon [name] stated currently he is experiencing stress as a family member's parents were in a MVA, which has reportedly impacted his eating. Brandon [name] noted he is eating an additional 200 calories daily, but shared that is a reduction from an additional 500-600 calories daily. Positive reinforcement was reported. A risk assessment was completed. Brandon [name] denied experiencing suicidal and homicidal ideation, plan, and intent since the last appointment with this provider. He continues to acknowledge understanding regarding the importance of reaching out to trusted individuals and/or emergency resources if he is unable to ensure safety.   Psychoeducation regarding triggers for emotional eating was provided. Brandon [name] was provided a handout, and encouraged to utilize the handout between now and the next appointment to increase awareness of triggers and frequency. Legrand Como agreed. This provider also discussed behavioral strategies for specific triggers, such as placing the utensil down when conversing to avoid mindless eating. Legrand Como provided verbal consent during today's appointment for this provider to send the handout about triggers via e-mail. Overall, Brandon [name] was receptive to today's session as evidenced by openness to sharing, responsiveness to feedback, and willingness to explore triggers for emotional eating.  Mental Status Examination:  Appearance: unable to assess  Behavior: cooperative Mood: euthymic Affect: unable to fully assess Speech: normal in rate, volume, and tone Eye Contact: unable to assess Psychomotor Activity: unable to assess Thought Process: linear, logical, and goal directed  Content/Perceptual Disturbances: denies suicidal and homicidal ideation, plan, and intent and no hallucinations, delusions, bizarre  thinking or behavior reported or observed Orientation: time, person, place  and purpose of appointment Cognition/Sensorium: memory, attention, language, and fund of knowledge intact  Insight: fair Judgment: fair  Structured Assessment Results: The Patient Health Questionnaire-9 (PHQ-9) is a self-report measure that assesses symptoms and severity of depression over the course of the last two weeks. Brandon [name] obtained a score of 8 suggesting mild depression. Brandon [name] finds the endorsed symptoms to be somewhat difficult. Little interest or pleasure in doing things 0  Feeling down, depressed, or hopeless 0  Trouble falling or staying asleep, or sleeping too much 0  Feeling tired or having little energy- due to health concerns 3  Poor appetite or overeating 1  Feeling bad about yourself --- or that you are a failure or have let yourself or your family down 1  Trouble concentrating on things, such as reading the newspaper or watching television- due to health concerns 3  Moving or speaking so slowly that other people could have noticed? Or the opposite --- being so fidgety or restless that you have been moving around a lot more than usual 0  Thoughts that you would be better off dead or hurting yourself in some way 0  PHQ-9 Score 8    The Generalized Anxiety Disorder-7 (GAD-7) is a brief self-report measure that assesses symptoms of anxiety over the course of the last two weeks. Brandon [name] obtained a score of 12 suggesting moderate anxiety. Brandon [name] finds the endorsed symptoms to be somewhat difficult. Feeling nervous, anxious, on edge 3  Not being able to stop or control worrying 3  Worrying too much about different things 0  Trouble relaxing 3  Being so restless that it's hard to sit still 2  Becoming easily annoyed or irritable 1  Feeling afraid as if something awful might happen 0  GAD-7 Score 12   Interventions:  Conducted a brief chart review Verbal administration of PHQ-9 and GAD-7 for symptom  monitoring Provided empathic reflections and validation Psychoeducation provided regarding triggers for emotional eating Discussed option for a referral for longer-term therapeutic services Provided positive reinforcement Employed supportive psychotherapy interventions to facilitate reduced distress, and to improve coping skills with identified stressors  Conducted a risk assessment  DSM-5 Diagnosis: 311 (F32.8) Other Specified Depressive Disorder, Emotional Eating Behaviors  Treatment Goal & Progress: During the initial appointment with this provider, the following treatment goal was established: decrease emotional eating. Rayvion has demonstrated some progress in his goal as evidenced by increased awareness of hunger patterns.   Plan: Huber continues to appear able and willing to participate as evidenced by engagement in reciprocal conversation, and asking questions for clarification as appropriate. The next appointment will be scheduled in three weeks, which will be in-person. The next session will focus on reviewing learned skills, and working towards the established treatment goal.

## 2019-04-11 ENCOUNTER — Other Ambulatory Visit: Payer: Self-pay

## 2019-04-11 ENCOUNTER — Ambulatory Visit (INDEPENDENT_AMBULATORY_CARE_PROVIDER_SITE_OTHER): Payer: 59 | Admitting: Psychology

## 2019-04-11 DIAGNOSIS — F3289 Other specified depressive episodes: Secondary | ICD-10-CM | POA: Diagnosis not present

## 2019-04-12 ENCOUNTER — Other Ambulatory Visit: Payer: Self-pay

## 2019-04-12 ENCOUNTER — Encounter (INDEPENDENT_AMBULATORY_CARE_PROVIDER_SITE_OTHER): Payer: Self-pay | Admitting: Family Medicine

## 2019-04-12 ENCOUNTER — Telehealth (INDEPENDENT_AMBULATORY_CARE_PROVIDER_SITE_OTHER): Payer: 59 | Admitting: Family Medicine

## 2019-04-12 DIAGNOSIS — F418 Other specified anxiety disorders: Secondary | ICD-10-CM | POA: Diagnosis not present

## 2019-04-12 DIAGNOSIS — Z6841 Body Mass Index (BMI) 40.0 and over, adult: Secondary | ICD-10-CM | POA: Diagnosis not present

## 2019-04-12 DIAGNOSIS — I10 Essential (primary) hypertension: Secondary | ICD-10-CM

## 2019-04-12 DIAGNOSIS — E119 Type 2 diabetes mellitus without complications: Secondary | ICD-10-CM

## 2019-04-12 MED ORDER — LISINOPRIL-HYDROCHLOROTHIAZIDE 20-12.5 MG PO TABS: 1.0000 | ORAL_TABLET | Freq: Every day | ORAL | 0 refills | Status: DC

## 2019-04-12 MED ORDER — LISINOPRIL-HYDROCHLOROTHIAZIDE 20-12.5 MG PO TABS: 1.0000 | ORAL_TABLET | Freq: Every day | ORAL | 3 refills | Status: DC

## 2019-04-13 DIAGNOSIS — F418 Other specified anxiety disorders: Secondary | ICD-10-CM | POA: Insufficient documentation

## 2019-04-13 NOTE — Progress Notes (Signed)
Office: [phone]  /  Fax: [phone] TeleHealth Visit:  Brandon [name] has verbally consented to this TeleHealth visit today. The patient is located at home, the provider is located at the [facility] office. The participants in this visit include the listed provider and patient. The visit was conducted today via Webex.  HPI:   Chief Complaint: OBESITY Brandon [name] is here to discuss his progress with his obesity treatment plan. He is on the Category 4 plan + 500 calories and is following his eating plan approximately 10% of the time. He states he is exercising 0 minutes 0 times per week. Brandon [name] reports he has maintained his weight. He has increased stress at home and reports increased stress eating. He is trying to choose "healthy things" when he stress eats. He reports tracking calories daily but is going over 2000 per day. He states he is an excessive amount of carbs (200-400 grams of carbs a day).  We were unable to weigh the patient today for this TeleHealth visit. He feels as if he has maintained his weight since his last visit. He has lost 18 lbs since starting treatment with [place].  Hypertension Brandon [name] is a 48 y.o. male with hypertension.  Brandon [name] denies chest pain or shortness of breath on exertion. He is working weight loss to help control his blood pressure with the goal of decreasing his risk of heart attack and stroke. Brandon [name]'s blood pressure was 149/95 at last check. Blood pressure has consistently been elevated at home. He reports increased headaches from blood pressure elevation.   Depression with Anxiety Brandon [name] is struggling with emotional eating and using food for comfort to the extent that it is negatively impacting his health. He often snacks when he is not hungry. Brandon [name] sometimes feels he is out of control and then feels guilty that he made poor food choices. He has been working on behavior modification techniques to help reduce his emotional  eating and has been somewhat successful. Brandon [name] reports stress eating has increased recently. He is very stressed due to helping a friend through a hard time. He shows no sign of suicidal or homicidal ideations.  Depression screen [name] 2/9 03/25/2018 02/17/2018 09/15/2017 08/24/2017 07/08/2017  Decreased Interest 0 0 1 0 0  Down, Depressed, Hopeless 0 0 1 0 0  PHQ - 2 Score 0 0 2 0 0  Altered sleeping - - 2 - -  Tired, decreased energy - - 1 - -  Change in appetite - - 3 - -  Feeling bad or failure about yourself  - - 1 - -  Trouble concentrating - - 3 - -  Moving slowly or fidgety/restless - - 1 - -  Suicidal thoughts - - 0 - -  PHQ-9 Score - - 13 - -   Diabetes Mellitus Brandon [name] has a diagnosis of diabetes mellitus, which is well controlled on Victoza and metformin. Brandon [name] does not report checking his blood sugars. Last A1c was 5.6 on 09/26/2018. He states he takes Victoza sporadically but reports it does help with appetite and cravings when he tales it.   ASSESSMENT AND PLAN:  Essential hypertension - Plan: lisinopril-hydrochlorothiazide (ZESTORETIC) 20-12.5 MG tablet, DISCONTINUED: lisinopril-hydrochlorothiazide (ZESTORETIC) 20-12.5 MG tablet  Depression with anxiety  Class 3 severe obesity with serious comorbidity and body mass index (BMI) of 40.0 to 44.9 in adult, unspecified obesity type (HCC)  PLAN:  Hypertension We discussed sodium restriction, working on healthy weight loss, and a regular exercise  program as the means to achieve improved blood pressure control. Brandon [name] agreed with this plan and agreed to follow up as directed. We will continue to monitor his blood pressure as well as his progress with the above lifestyle modifications. Brandon [name] will increase his dose of lisinopril to 20-12.5 daily #90 with 0 refills. He may take 2 of pills he has left daily (10-12.5) until he gets his new prescription.  He will watch for signs of hypotension as he continues his lifestyle  modifications.  Depression with Anxiety We discussed behavior modification techniques today to help Brandon [name] deal with his emotional eating and depression. Brandon [name] will continue to see Dr. Dewaine Conger and follow-up as directed.  Diabetes Mellitus Brandon [name] has been given extensive diabetes education by myself today including ideal fasting and post-prandial blood glucose readings, individual ideal HgA1c goals  and hypoglycemia prevention. We discussed the importance of good blood sugar control to decrease the likelihood of diabetic complications such as nephropathy, neuropathy, limb loss, blindness, coronary artery disease, and death. We discussed the importance of intensive lifestyle modification including diet, exercise and weight loss as the first line treatment for diabetes. [name] agrees to continue his diabetes medications and will follow-up at the agreed upon time. He will try to take Victoza regularly.  Obesity  At least 15 minutes were spent on discussing the following behavioral intervention visit.  Brandon [name] is currently in the action stage of change. As such, his goal is to continue with weight loss efforts. He has agreed to keep a food journal with 2000 calories and 100+ grams of protein daily. Brandon [name] has been instructed to work up to a goal of 150 minutes of combined cardio and strengthening exercise per week for weight loss and overall health benefits. We discussed the following Behavioral Modification Strategies today: decreasing simple carbohydrates, planning for success, and keep a strict food journal.  Brandon [name] has agreed to follow-up with our clinic in 2 weeks. He was informed of the importance of frequent follow-up visits to maximize his success with intensive lifestyle modifications for his multiple health conditions.  ALLERGIES: Allergies  Allergen Reactions  . Amoxicillin Anaphylaxis    Has patient had a PCN reaction causing immediate rash, facial/tongue/throat swelling, SOB or  lightheadedness with hypotension: Yes Has patient had a PCN reaction causing severe rash involving mucus membranes or skin necrosis: No Has patient had a PCN reaction that required hospitalization: No Has patient had a PCN reaction occurring within the last 10 years: No If all of the above answers are "NO", then may proceed with Cephalosporin use.   [name] Penicillin G Anaphylaxis    Gets extremely high fevers. Has patient had a PCN reaction causing immediate rash, facial/tongue/throat swelling, SOB or lightheadedness with hypotension: Yes Has patient had a PCN reaction causing severe rash involving mucus membranes or skin necrosis: No Has patient had a PCN reaction that required hospitalization: No Has patient had a PCN reaction occurring within the last 10 years: No If all of the above answers are "NO", then may proceed with Cephalosporin use.   . Sulfa Antibiotics Other (See Comments)    It will kill him  . Cephalosporins Other (See Comments)    Reaction unspecified.  . Quinolones Other (See Comments)    Reaction unspecified  . Sulfamethoxazole Swelling    MEDICATIONS: Current Outpatient Medications on File Prior to Visit  Medication Sig Dispense Refill  . atorvastatin (LIPITOR) 20 MG tablet TAKE 1 TABLET (20 MG TOTAL) BY MOUTH AT BEDTIME. (Patient taking differently: Take  20 mg by mouth at bedtime. ) 30 tablet 0  . colesevelam (WELCHOL) 625 MG tablet Take 3 tablets (1,875 mg total) by mouth daily with breakfast. 540 tablet 1  . cyclobenzaprine (FLEXERIL) 10 MG tablet Take 1 tablet (10 mg total) by mouth 3 (three) times daily as needed for muscle spasms. 30 tablet 0  . diazepam (VALIUM) 5 MG tablet Take 1 tablet (5 mg total) by mouth every 8 (eight) hours as needed for anxiety. (Patient taking differently: Take 5 mg by mouth at bedtime. ) 20 tablet 0  . docusate sodium (COLACE) 100 MG capsule Take 200 mg by mouth 2 (two) times daily. Taken at 08:00 and 20:00    . gabapentin (NEURONTIN)  300 MG capsule Take 3 capsules (900 mg total) by mouth 2 (two) times daily. 540 capsule 1  . Insulin Pen Needle (BD PEN NEEDLE NANO U/F) 32G X 4 MM MISC 1 Package by Does not apply route 2 (two) times daily. 100 each 0  . liraglutide (VICTOZA) 18 MG/3ML SOPN Inject 0.1 mLs (0.6 mg total) into the skin every morning. 1 pen 0  . metFORMIN (GLUCOPHAGE-XR) 500 MG 24 hr tablet TAKE 2 TABLETS (1,000 MG TOTAL) BY MOUTH 2 (TWO) TIMES DAILY. (Patient taking differently: Take 1,000 mg by mouth 2 (two) times daily. TAKE 2 TABLETS (1,000 MG TOTAL) BY MOUTH 2 (TWO) TIMES DAILY.) 360 tablet 1  . NON FORMULARY CPAP machine at bedtime & sleep    . Vitamin D, Ergocalciferol, (DRISDOL) 1.25 MG (50000 UT) CAPS capsule Take 1 capsule (50,000 Units total) by mouth every 7 (seven) days. 4 capsule 0   No current facility-administered medications on file prior to visit.     PAST MEDICAL HISTORY: Past Medical History:  Diagnosis Date  . ADEM (acute disseminated encephalomyelitis)   . Allergy   . Anxiety 2005  . Complication of anesthesia   . Constipation   . Depression 2005  . Diabetes mellitus without complication (HCC)    Type 2  . Difficult intubation    difficult intubating and exbutating "throat Clinched around tube" during an Endoscopy procedure  . Dyspnea    with exertion  . Headache(784.0)   . Hearing loss   . Hyperinsulinemia 07/04/11   "I'm on metformin cause I produce too much insulin"  . Hyperlipidemia   . Hypertension   . Mononucleosis, infectious, with hepatitis 06/2011  . MS (multiple sclerosis) (HCC)    patinet has ADEM not MS per Patient  . Nausea    occasional per history form 08/18/11  . Neuromuscular disorder (HCC)   . Pneumonia 2010  . Sleep apnea    uses CPAP at home  . Stroke (HCC) 2000   TIA  . TIA (transient ischemic attack) 2000   denies residual  . TIA (transient ischemic attack)     PAST SURGICAL HISTORY: Past Surgical History:  Procedure Laterality Date  .  ANTERIOR CERVICAL DECOMP/DISCECTOMY FUSION N/A 10/21/2016   Procedure: ANTERIOR CERVICAL DECOMPRESSION/DISCECTOMY FUSION, INTERBODY PROSTHESIS,PLATE CERVICAL FOUR- CERVICAL FIVE, CERVICAL FIVE- CERVICAL SIX;  Surgeon: Tressie StalkerJeffrey Jenkins, MD;  Location: [facility] OR;  Service: Neurosurgery;  Laterality: N/A;  . ANTERIOR CERVICAL DECOMP/DISCECTOMY FUSION N/A 05/19/2018   Procedure: ANTERIOR CERVICAL DECOMPRESSION/DISCECTOMY FUSION, INTERBODY PROSTHESIS,PLATE/SCREWS CERVICAL SIX- CERVICAL SEVEN; EXPLORE FUSION;  Surgeon: Tressie StalkerJenkins, Jeffrey, MD;  [facility];  Service: Neurosurgery;  Laterality: N/A;  . CHOLECYSTECTOMY  08/02/2011   Procedure: LAPAROSCOPIC CHOLECYSTECTOMY;  Surgeon: Shelly Rubensteinouglas A Blackman, MD;  Location: [facility] OR;  Service: General;;  .  COLONOSCOPY  2003  . ESOPHAGOGASTRODUODENOSCOPY  2003  . ESOPHAGOGASTRODUODENOSCOPY  07/28/2011   Procedure: ESOPHAGOGASTRODUODENOSCOPY (EGD);  Surgeon: Yancey FlemingsJohn Perry, MD;  [facility];  Service: Endoscopy;  Laterality: N/A;  . MYRINGOTOMY  1979; 1980   bilaterally  . TONSILLECTOMY AND ADENOIDECTOMY  ~ 1980    SOCIAL HISTORY: Social History   Tobacco Use  . Smoking status: Former Smoker    Packs/day: 3.00    Years: 3.00    Pack years: 9.00    Types: Cigarettes    Quit date: 08/18/1987    Years since quitting: 31.6  . Smokeless tobacco: Never Used  Substance Use Topics  . Alcohol use: Not Currently    Alcohol/week: 0.0 standard drinks    Comment: 07/04/11 "glass of wine or spirits once a month"  . Drug use: Not Currently    Types: Marijuana    Comment: have not used in a 1.5 years    FAMILY HISTORY: Family History  Problem Relation Age of Onset  . Coronary artery disease Father   . Stroke Father   . Cancer Father   . High blood pressure Father   . Alcoholism Father   . Diabetes Mother   . Obesity Mother   . Cancer Maternal Grandmother        bone  . Cancer Other        Stomach -Great-great grandmother   ROS: Review of Systems   Respiratory: Negative for shortness of breath.   Cardiovascular: Negative for chest pain.  Psychiatric/Behavioral: Positive for depression. Negative for suicidal ideas. The patient is nervous/anxious.        Negative for homicidal ideas.   PHYSICAL EXAM: Pt in no acute distress  RECENT LABS AND TESTS: BMET    Component Value Date/Time   NA 144 09/26/2018 1126   K 4.0 09/26/2018 1126   CL 98 09/26/2018 1126   CO2 26 09/26/2018 1126   GLUCOSE 85 09/26/2018 1126   GLUCOSE 83 05/13/2018 0957   BUN 17 09/26/2018 1126   CREATININE 1.09 09/26/2018 1126   CREATININE 1.02 08/24/2017 1458   CALCIUM 10.2 09/26/2018 1126   GFRNONAA 80 09/26/2018 1126   GFRNONAA >89 07/18/2015 1656   GFRAA 93 09/26/2018 1126   GFRAA >89 07/18/2015 1656   Lab Results  Component Value Date   HGBA1C 5.6 09/26/2018   HGBA1C 4.9 05/19/2018   HGBA1C 5.0 05/13/2018   HGBA1C 5.5 02/16/2018   HGBA1C 5.7 (H) 09/15/2017   Lab Results  Component Value Date   INSULIN 76.8 (H) 09/26/2018   INSULIN 60.7 (H) 02/16/2018   INSULIN 110.6 (H) 09/15/2017   CBC    Component Value Date/Time   WBC 6.1 09/26/2018 1126   WBC 5.0 05/13/2018 0957   RBC 6.88 (H) 09/26/2018 1126   RBC 5.71 05/13/2018 0957   HGB 18.2 (H) 09/26/2018 1126   HCT 55.8 (H) 09/26/2018 1126   PLT 161 05/13/2018 0957   PLT 158 09/17/2016 1611   MCV 81 09/26/2018 1126   MCH 26.5 (L) 09/26/2018 1126   MCH 26.8 05/13/2018 0957   MCHC 32.6 09/26/2018 1126   MCHC 32.6 05/13/2018 0957   RDW 15.3 09/26/2018 1126   LYMPHSABS 2.2 09/26/2018 1126   MONOABS 0.5 12/05/2014 1538   EOSABS 0.1 09/26/2018 1126   BASOSABS 0.0 09/26/2018 1126   Iron/TIBC/Ferritin/ %Sat No results found for: IRON, TIBC, FERRITIN, IRONPCTSAT Lipid Panel     Component Value Date/Time   CHOL 121 09/26/2018 1126   TRIG 250 (  H) 09/26/2018 1126   HDL 32 (L) 09/26/2018 1126   CHOLHDL 3.4 08/24/2017 1458   VLDL 70 (H) 10/15/2015 1112   LDLCALC 39 09/26/2018 1126    LDLCALC 57 08/24/2017 1458   Hepatic Function Panel     Component Value Date/Time   PROT 7.4 09/26/2018 1126   ALBUMIN 4.9 09/26/2018 1126   AST 38 09/26/2018 1126   ALT 93 (H) 09/26/2018 1126   ALKPHOS 70 09/26/2018 1126   BILITOT 0.6 09/26/2018 1126   BILIDIR 0.1 04/11/2015 1638   IBILI 0.6 04/11/2015 1638      Component Value Date/Time   TSH 2.200 09/26/2018 1126   TSH 2.080 09/15/2017 0902   TSH 3.190 09/17/2016 1611   Results for RIGGIN, CUTTINO "[facility]" (MRN [id number]) as of 04/13/2019 09:55  Ref. Range 09/26/2018 11:26  Vitamin D, 25-Hydroxy Latest Ref Range: 30.0 - 100.0 ng/mL 33.3   I, Michaelene Song, am acting as Location manager for [employer], FNP  I have reviewed the above documentation for accuracy and completeness, and I agree with the above.  - Taiden Raybourn, FNP-C.

## 2019-05-01 NOTE — Progress Notes (Unsigned)
Office: [phone]  /  Fax: [phone]    Date: May 04, 2019   Time Seen:*** Duration: *** minutes Provider: Lawerance [name], Brandon [name]. Type of Session: Individual Therapy  Type of Contact: Face-to-face  Informed Consent for In-Person Services During COVID-19: During today's appointment, information about the decision to initiate in-person services in light of the COVID-19 public health crisis was discussed. Brandon [name] and this provider agreed to meet in person for some or all future appointments. If there is a resurgence of the pandemic or other health concerns arise, telepsychological services may be initiated and any related concerns will be discussed and an attempt to address them will be made. Brandon [name] verbally acknowledged understanding that if necessary, this provider may determine there is a need to initiate telepsychological services for everyone's well-being. Brandon [name] expressed understanding he may request to initiate telepsychological services, and that request will be respected as long as it is feasible and clinically appropriate. Regarding telepsychological services, Brandon [name] acknowledged he is ultimately responsible for understanding his insurance benefits as it relates to reimbursement of telepsychological services. Moreover, the risks for opting for in-person services was discussed. Brandon [name] verbally acknowledged understanding that by coming to the office, he is assuming the risk of exposure to the coronavirus or other public risk, and the risk may increase if Brandon [name] travels by Brandon [name], cab, or Brandon [name]. To obtain in-person services, Brandon [name] verbally agreed to taking certain precautions (e.g., screening prior to appointment; universal masking; social distancing of 6 feet; proper hand hygiene; no visitors) set forth by [facility] to keep everyone safe from exposure, sickness, and possible death. This information was *** shared by front desk staff either at the time  of scheduling and/or during the check-in process. Brandon [name] expressed understanding that should he not adhere to these safeguards, it may result in starting/returning to a telepsychological service arrangement and/or the exploration of other options for treatment. Brandon [name] acknowledged understanding that [facility] will follow the protocol set forth by [facility] should a patient present with a fever or other symptoms or disclose recent exposure, which will include rescheduling the appointment. Furthermore, Brandon [name] acknowledged understanding that precautions may change if additional local, state or federal orders or guidelines are published. This provider also shared that if Brandon [name] tests positive for the coronavirus, this provider may be required to notify local health authorities that Brandon [name] was in the [facility] clinic. Only minimum information necessary for data collection will be disclosed. This provider will follow [facility]'s disclosure policy should this provider or staff test positive for the coronavirus. To avoid handling of paper/writing instruments and increasing likelihood of touching, verbal consent was obtained by Brandon [name] during today's appointment prior to proceeding. Brandon [name] provided verbal consent to proceed, and acknowledged understanding that by verbally consenting to proceed, he is agreeable to all information noted above.   Session Content: Brandon [name] is a 48 y.o. male presenting for a follow-up appointment to address the previously established treatment goal of decreasing emotional eating. The session was initiated with the administration of the PHQ-9 and GAD-7, as well as a brief check-in. A risk assessment was completed. [name] denied experiencing suicidal and homicidal ideation, plan, and intent since the last appointment with this provider. He continues to acknowledge understanding regarding the importance of reaching out to trusted individuals and/or  emergency resources if he is unable to ensure safety. *** Brandon [name] was receptive to today's session as evidenced by openness to sharing, responsiveness to feedback, and ***.  Mental Status Examination:  Appearance: {Appearance:22431} Behavior: {Behavior:22445} Mood: {gbmood:21757} Affect: {Affect:22436} Speech: {Speech:22432} Eye Contact: {Eye Contact:22433} Psychomotor Activity: {Motor Activity:22434} Thought Process: {thought process:22448}  Content/Perceptual Disturbances: {disturbances:22451} Orientation: {Orientation:22437} Cognition/Sensorium: {gbcognition:22449} Insight: {Insight:22446} Judgment: {Insight:22446}  Structured Assessment Results: The Patient Health Questionnaire-9 (PHQ-9) is a self-report measure that assesses symptoms and severity of depression over the course of the last two weeks. Brandon [name] obtained a score of *** suggesting {GBPHQ9SEVERITY:21752}. Brandon [name] finds the endorsed symptoms to be {gbphq9difficulty:21754}. Brandon [name] interest or pleasure in doing things ***  Feeling down, depressed, or hopeless ***  Trouble falling or staying asleep, or sleeping too much ***  Feeling tired or having Brandon [name] energy ***  Poor appetite or overeating ***  Feeling bad about yourself --- or that you are a failure or have let yourself or your family down ***  Trouble concentrating on things, such as reading the newspaper or watching television ***  Moving or speaking so slowly that other people could have noticed? Or the opposite --- being so fidgety or restless that you have been moving around a lot more than usual ***  Thoughts that you would be better off dead or hurting yourself in some way ***  PHQ-9 Score ***    The Generalized Anxiety Disorder-7 (GAD-7) is a brief self-report measure that assesses symptoms of anxiety over the course of the last two weeks. Brandon [name] obtained a score of *** suggesting {gbgad7severity:21753}. Brandon [name] finds the endorsed symptoms to be  {gbphq9difficulty:21754}. Feeling nervous, anxious, on edge ***  Not being able to stop or control worrying ***  Worrying too much about different things ***  Trouble relaxing ***  Being so restless that it's hard to sit still ***  Becoming easily annoyed or irritable ***  Feeling afraid as if something awful might happen ***  GAD-7 Score ***   Interventions:  {Interventions:22172}  DSM-5 Diagnosis: 311 (F32.8) Other Specified Depressive Disorder, Emotional Eating Behaviors  Treatment Goal & Progress: During the initial appointment with this provider, the following treatment goal was established: decrease emotional eating. Brandon [name] has demonstrated progress in his goal as evidenced by {gbtxprogress:22839}. Brandon [name] also reported {gbtxprogress2:22951}.  Plan: Brandon [name] continues to appear able and willing to participate as evidenced by engagement in reciprocal conversation, and asking questions for clarification as appropriate.*** The next appointment will be scheduled in {gbweeks:21758}. The next session will focus on reviewing learned skills, and working towards the established treatment goal.***

## 2019-05-04 ENCOUNTER — Ambulatory Visit (INDEPENDENT_AMBULATORY_CARE_PROVIDER_SITE_OTHER): Payer: Medicare Other | Admitting: Psychology

## 2019-05-04 ENCOUNTER — Telehealth (INDEPENDENT_AMBULATORY_CARE_PROVIDER_SITE_OTHER): Payer: Self-pay | Admitting: Psychology

## 2019-05-04 NOTE — Telephone Encounter (Signed)
  Office: [phone]  /  Fax: [phone]  Date of Call: May 04, 2019  Time of Call: 2:37pm Duration of Call: 2 minutes Provider: Glennie Isle, PsyD  CONTENT: This provider was informed by front desk staff Adrian Saran) that Legrand Como called regarding his 2:30pm in-office appointment. He reportedly was not feeling well. Orson Slick called Jaston again per this provider's request to switch Mardy to a WebEx appointment or reschedule if necessary. Due to appointment availability concerns, the call was transferred to this provider at 2:37pm. Legrand Como shared he was not feeling well secondary to something he ate yesterday. He was offered the option to complete today's appointment via telephone, but he requested to reschedule today's appointment. A brief risk assessment was completed. Collen denied experiencing suicidal and homicidal ideation, plan, or intent since the last appointment with this provider. In addition, Levern shared he spoke with his previous therapist two weeks ago and he plans to call her tomorrow per their discussion to schedule another appointment.   PLAN:  Korbyn is scheduled for an in-office appointment on May 11, 2019 at 12:00pm with this provider.

## 2019-05-04 NOTE — Progress Notes (Signed)
Office: [phone]  /  Fax: [phone]    Date: May 11, 2019   Time Seen: 11:55am Duration: 36 minutes Provider: Lawerance Cruel, Psy.D. Type of Session: Individual Therapy  Type of Contact: Face-to-face  Informed Consent for In-Person Services During COVID-19: During today's appointment, information about the decision to initiate in-person services in light of the COVID-19 public health crisis was discussed. [name] and this provider agreed to meet in person for some or all future appointments. If there is a resurgence of the pandemic or other health concerns arise, telepsychological services may be initiated and any related concerns will be discussed and an attempt to address them will be made. Harvir verbally acknowledged understanding that if necessary, this provider may determine there is a need to initiate telepsychological services for everyone's well-being. Sebastiano expressed understanding he may request to initiate telepsychological services, and that request will be respected as long as it is feasible and clinically appropriate. Regarding telepsychological services, Abishai acknowledged he is ultimately responsible for understanding his insurance benefits as it relates to reimbursement of telepsychological services. Moreover, the risks for opting for in-person services was discussed. Berwyn verbally acknowledged understanding that by coming to the office, he is assuming the risk of exposure to the coronavirus or other public risk, and the risk may increase if Macen travels by [employer], cab, or [employer]. To obtain in-person services, Obi verbally agreed to taking certain precautions (e.g., screening prior to appointment; universal masking; social distancing of 6 feet; proper hand hygiene; no visitors) set forth by [facility] to keep everyone safe from exposure, sickness, and possible death. This information was shared by front desk staff either at the time  of scheduling and/or during the check-in process. Sanjuan expressed understanding that should he not adhere to these safeguards, it may result in starting/returning to a telepsychological service arrangement and/or the exploration of other options for treatment. Kalib acknowledged understanding that [facility] will follow the protocol set forth by [facility] should a patient present with a fever or other symptoms or disclose recent exposure, which will include rescheduling the appointment. Furthermore, Eleanor acknowledged understanding that precautions may change if additional local, state or federal orders or guidelines are published. This provider also shared that if Claudis tests positive for the coronavirus, this provider may be required to notify local health authorities that Kamil was in the [facility] clinic. Only minimum information necessary for data collection will be disclosed. This provider will follow [facility]'s disclosure policy should this provider or staff test positive for the coronavirus. To avoid handling of paper/writing instruments and increasing likelihood of touching, verbal consent was obtained by [name] during today's appointment prior to proceeding. Jerrico provided verbal consent to proceed, and acknowledged understanding that by verbally consenting to proceed, he is agreeable to all information noted above.   Session Content: Vannie is a 48 y.o. male presenting for a follow-up appointment to address the previously established treatment goal of decreasing emotional eating. The session was initiated with a brief check-in. A risk assessment was completed. Keldric denied experiencing suicidal and homicidal ideation, plan, and intent since the last appointment with this provider. He continues to acknowledge understanding regarding the importance of reaching out to trusted individuals and/or emergency resources if he is unable to ensure  safety.  Ziaire shared he has talked to [place] (other therapist) twice since the last appointment with this provider, but has not called to schedule a follow-up appointment. He was encouraged to call Kendal Hymen  and schedule an appointment. Kapil was observed making a reminder for himself to call her in his phone. He further reported he has been "focusing on family" and acknowledged self-care is not a priority. Additionally, Erskin shared he reviewed shared handouts. He acknowledged he will eat in response to triggers for emotional eating, but "there has to be some physical hunger." He added, he has "removed danger foods" from the house to help reduce emotional eating. Positive reinforcement was provided. Psychoeducation regarding the connection between thoughts, feelings, and behaviors was provided. [name] was given a handout outlining the aforementioned. Additionally, psychoeducation regarding the importance of self-care utilizing the oxygen mask metaphor was provided due to ongoing stressors related to politics. Furthermore, psychoeducation regarding pleasurable activities, including its impact on emotional eating and overall well-being was provided. Phuoc was provided with a handout with various options of pleasurable activities, and was encouraged to engage in one activity a day and additional activities as needed when triggered to emotionally eat. Legrand Como agreed. Of note, it was recommended he schedule a follow-up appointment with Dr. Leafy Ro. He indicated he would schedule an appointment on his way out from the appointment with this provider. Overall, Flor was receptive to today's session as evidenced by openness to sharing, responsiveness to feedback, and willingness to engage in pleasurable activities.   Mental Status Examination:  Appearance: neat Behavior: cooperative Mood: euthymic Affect: mood congruent Speech: normal in rate, volume, and tone Eye Contact: appropriate Psychomotor  Activity: appropriate; ambulates with a cane Thought Process: linear, logical, and goal directed  Content/Perceptual Disturbances: denies suicidal and homicidal ideation, plan, and intent and no hallucinations, delusions, bizarre thinking or behavior reported or observed Orientation: time, person, place and purpose of appointment Cognition/Sensorium: memory, attention, language, and fund of knowledge intact  Insight: fair Judgment: fair  Interventions:  Conducted a brief chart review Provided empathic reflections and validation Reviewed content from the previous session Conducted a risk assessment Psychoeducation provided regarding the connection between thoughts, feelings, and behaviors Psychoeducation provided regarding pleasurable activities Employed motivational interviewing skills to assess patient's willingness/desire to adhere to recommended medical treatments and assignments Employed supportive psychotherapy interventions to facilitate reduced distress, and to improve coping skills with identified stressors Psychoeducation provided regarding self-care  DSM-5 Diagnosis: 311 (F32.8) Other Specified Depressive Disorder, Emotional Eating Behaviors  Treatment Goal & Progress: During the initial appointment with this provider, the following treatment goal was established: decrease emotional eating. Juniel has demonstrated progress in his goal as evidenced by increased awareness of hunger patterns and triggers for emotional eating. Zebulan also demonstrates willingness to engage in pleasurable activities.  Plan: Jaskaran continues to appear able and willing to participate as evidenced by engagement in reciprocal conversation, and asking questions for clarification as appropriate. The next appointment will be scheduled in 2-3 weeks. The next session will focus on reviewing learned skills, and working towards the established treatment goal.

## 2019-05-11 ENCOUNTER — Ambulatory Visit (INDEPENDENT_AMBULATORY_CARE_PROVIDER_SITE_OTHER): Payer: 59 | Admitting: Psychology

## 2019-05-11 ENCOUNTER — Other Ambulatory Visit: Payer: Self-pay

## 2019-05-11 DIAGNOSIS — F3289 Other specified depressive episodes: Secondary | ICD-10-CM | POA: Diagnosis not present

## 2019-05-15 ENCOUNTER — Other Ambulatory Visit: Payer: Self-pay

## 2019-05-15 ENCOUNTER — Encounter (INDEPENDENT_AMBULATORY_CARE_PROVIDER_SITE_OTHER): Payer: Self-pay | Admitting: Family Medicine

## 2019-05-15 ENCOUNTER — Ambulatory Visit (INDEPENDENT_AMBULATORY_CARE_PROVIDER_SITE_OTHER): Payer: 59 | Admitting: Family Medicine

## 2019-05-15 VITALS — BP 137/93 | HR 90 | Temp 98.4°F | Ht 68.0 in | Wt 303.0 lb

## 2019-05-15 DIAGNOSIS — Z6841 Body Mass Index (BMI) 40.0 and over, adult: Secondary | ICD-10-CM

## 2019-05-15 DIAGNOSIS — E7849 Other hyperlipidemia: Secondary | ICD-10-CM | POA: Diagnosis not present

## 2019-05-15 DIAGNOSIS — E559 Vitamin D deficiency, unspecified: Secondary | ICD-10-CM | POA: Diagnosis not present

## 2019-05-15 DIAGNOSIS — E119 Type 2 diabetes mellitus without complications: Secondary | ICD-10-CM

## 2019-05-15 MED ORDER — VITAMIN D (ERGOCALCIFEROL) 1.25 MG (50000 UNIT) PO CAPS: 50000.0000 [IU] | ORAL_CAPSULE | ORAL | 0 refills | Status: DC

## 2019-05-16 ENCOUNTER — Encounter: Payer: Self-pay | Admitting: Adult Health

## 2019-05-16 ENCOUNTER — Ambulatory Visit (INDEPENDENT_AMBULATORY_CARE_PROVIDER_SITE_OTHER): Payer: 59 | Admitting: Adult Health

## 2019-05-16 VITALS — BP 138/88 | HR 83 | Temp 97.5°F | Ht 68.0 in | Wt 307.4 lb

## 2019-05-16 DIAGNOSIS — Z9989 Dependence on other enabling machines and devices: Secondary | ICD-10-CM

## 2019-05-16 DIAGNOSIS — G4733 Obstructive sleep apnea (adult) (pediatric): Secondary | ICD-10-CM | POA: Diagnosis not present

## 2019-05-16 LAB — COMPREHENSIVE METABOLIC PANEL
ALT: 92 IU/L — ABNORMAL HIGH (ref 0–44)
AST: 35 IU/L (ref 0–40)
Albumin/Globulin Ratio: 1.8 (ref 1.2–2.2)
Albumin: 4.5 g/dL (ref 4.0–5.0)
Alkaline Phosphatase: 70 IU/L (ref 39–117)
BUN/Creatinine Ratio: 23 — ABNORMAL HIGH (ref 9–20)
BUN: 21 mg/dL (ref 6–24)
Bilirubin Total: 0.8 mg/dL (ref 0.0–1.2)
CO2: 25 mmol/L (ref 20–29)
Calcium: 9.8 mg/dL (ref 8.7–10.2)
Chloride: 102 mmol/L (ref 96–106)
Creatinine, Ser: 0.91 mg/dL (ref 0.76–1.27)
GFR calc Af Amer: 116 mL/min/{1.73_m2} (ref >59)
GFR calc non Af Amer: 100 mL/min/{1.73_m2} (ref >59)
Globulin, Total: 2.5 g/dL (ref 1.5–4.5)
Glucose: 77 mg/dL (ref 65–99)
Potassium: 3.6 mmol/L (ref 3.5–5.2)
Sodium: 142 mmol/L (ref 134–144)
Total Protein: 7 g/dL (ref 6.0–8.5)

## 2019-05-16 LAB — CBC WITH DIFFERENTIAL/PLATELET
Basophils Absolute: 0 10*3/uL (ref 0.0–0.2)
Basos: 1 %
EOS (ABSOLUTE): 0.1 10*3/uL (ref 0.0–0.4)
Eos: 2 %
Hematocrit: 51.1 % — ABNORMAL HIGH (ref 37.5–51.0)
Hemoglobin: 17.3 g/dL (ref 13.0–17.7)
Immature Grans (Abs): 0 10*3/uL (ref 0.0–0.1)
Immature Granulocytes: 0 %
Lymphocytes Absolute: 2.1 10*3/uL (ref 0.7–3.1)
Lymphs: 35 %
MCH: 27.3 pg (ref 26.6–33.0)
MCHC: 33.9 g/dL (ref 31.5–35.7)
MCV: 81 fL (ref 79–97)
Monocytes Absolute: 0.5 10*3/uL (ref 0.1–0.9)
Monocytes: 8 %
Neutrophils Absolute: 3.3 10*3/uL (ref 1.4–7.0)
Neutrophils: 54 %
Platelets: 163 10*3/uL (ref 150–450)
RBC: 6.34 x10E6/uL — ABNORMAL HIGH (ref 4.14–5.80)
RDW: 15 % (ref 11.6–15.4)
WBC: 6 10*3/uL (ref 3.4–10.8)

## 2019-05-16 LAB — LIPID PANEL WITH LDL/HDL RATIO
Cholesterol, Total: 108 mg/dL (ref 100–199)
HDL: 33 mg/dL — ABNORMAL LOW (ref >39)
LDL Chol Calc (NIH): 38 mg/dL (ref 0–99)
LDL/HDL Ratio: 1.2 ratio (ref 0.0–3.6)
Triglycerides: 238 mg/dL — ABNORMAL HIGH (ref 0–149)
VLDL Cholesterol Cal: 37 mg/dL (ref 5–40)

## 2019-05-16 LAB — VITAMIN D 25 HYDROXY (VIT D DEFICIENCY, FRACTURES): Vit D, 25-Hydroxy: 34.6 ng/mL (ref 30.0–100.0)

## 2019-05-16 LAB — HEMOGLOBIN A1C
Est. average glucose Bld gHb Est-mCnc: 128 mg/dL
Hgb A1c MFr Bld: 6.1 % — ABNORMAL HIGH (ref 4.8–5.6)

## 2019-05-16 LAB — INSULIN, RANDOM: INSULIN: 94 u[IU]/mL — ABNORMAL HIGH (ref 2.6–24.9)

## 2019-05-16 NOTE — Patient Instructions (Signed)

## 2019-05-16 NOTE — Progress Notes (Signed)
Office: [phone]  /  Fax: [phone]   HPI:   Chief Complaint: OBESITY Brandon [name] is here to discuss his progress with his obesity treatment plan. He is on the keep a food journal with 2000 calories and 100+ grams of protein daily and is following his eating plan approximately 50 % of the time. He states he is doing more movement for 10-15 minutes 4 times per week. Brandon [name]'s last visit in office was 7-8 months ago. He has been trying to decreased emotional eating. He feels he is ready to get back on track.  His weight is (!) 303 lb (137.4 kg) today and has gained 16 lbs since his last visit. He has lost 2 lbs since starting treatment with [place].  Diabetes II Brandon [name] has a diagnosis of diabetes type II. Brandon [name] states he is not checking his blood sugars at home. He is overdue for labs. He denies hypoglycemia. Last A1c was 5.6. He has been working on intensive lifestyle modifications including diet, exercise, and weight loss to help control his blood glucose levels.  Vitamin D Deficiency Brandon [name] has a diagnosis of vitamin D deficiency. He is currently taking prescription Vit D. Last Vit D level was at goal and he is at risk for over-replacement. He denies nausea, vomiting or muscle weakness.  Hyperlipidemia Brandon [name] has a diagnosis of hyperlipidemia. He has been attempting to improve his cholesterol levels with intensive lifestyle modification including a low saturated fat diet, exercise and weight loss, but he has not been able to follow his meal plan. He denies any chest pain, claudication or myalgias.  ASSESSMENT AND PLAN:  Type 2 diabetes mellitus without complication, without long-term current use of insulin ([place]) - Plan: Comprehensive Metabolic Panel (CMET), CBC w/Diff/Platelet, HgB A1c, Insulin, random  Vitamin D deficiency - Plan: Vitamin D (25 hydroxy), Vitamin D, Ergocalciferol, (DRISDOL) 1.25 MG (50000 UT) CAPS capsule  Other hyperlipidemia - Plan: Lipid Panel With LDL/HDL  Ratio  Class 3 severe obesity with serious comorbidity and body mass index (BMI) of 45.0 to 49.9 in adult, unspecified obesity type ([place])  PLAN:  Diabetes II Brandon [name] has been given extensive diabetes education by myself today including ideal fasting and post-prandial blood glucose readings, individual ideal Hgb A1c goals and hypoglycemia prevention. We discussed the importance of good blood sugar control to decrease the likelihood of diabetic complications such as nephropathy, neuropathy, limb loss, blindness, coronary artery disease, and death. We discussed the importance of intensive lifestyle modification including diet, exercise and weight loss as the first line treatment for diabetes. Brandon [name] agrees to continue his diabetes medications, and we will check labs today. Brandon [name] agrees to follow up with our clinic in 3 to 4 weeks.  Vitamin D Deficiency Brandon [name] was informed that low vitamin D levels contributes to fatigue and are associated with obesity, breast, and colon cancer. [name] agrees to continue taking prescription Vit D 50,000 IU every week #4 and we will refill for 1 month. He will follow up for routine testing of vitamin D, at least 2-3 times per year. He was informed of the risk of over-replacement of vitamin D and agrees to not increase his dose unless he discusses this with [place] first. We will check labs today. Brandon [name] agrees to follow up with our clinic in 3 to 4 weeks.  Hyperlipidemia Brandon [name] was informed of the American Heart Association Guidelines emphasizing intensive lifestyle modifications as the first line treatment for hyperlipidemia. We discussed many lifestyle modifications today in depth, and Brandon [name] will continue to  work on decreasing saturated fats such as fatty red meat, butter and many fried foods. He will also increase vegetables and lean protein in his diet and continue to work on diet, exercise and weight loss efforts. We will check labs today. Brandon [name] agrees to follow  up with our clinic in 3 to 4 weeks.  Obesity Brandon [name] is currently in the action stage of change. As such, his goal is to get back to weightloss efforts  He has agreed to follow the Category 4 plan Brandon [name] has been instructed to work up to a goal of 150 minutes of combined cardio and strengthening exercise per week for weight loss and overall health benefits. We discussed the following Behavioral Modification Strategies today: increasing lean protein intake, decreasing simple carbohydrates  and work on meal planning and easy cooking plans   Brandon [name] has agreed to follow up with our clinic in 3 to 4 weeks. He was informed of the importance of frequent follow up visits to maximize his success with intensive lifestyle modifications for his multiple health conditions.  ALLERGIES: Allergies  Allergen Reactions   Amoxicillin Anaphylaxis    Has patient had a PCN reaction causing immediate rash, facial/tongue/throat swelling, SOB or lightheadedness with hypotension: Yes Has patient had a PCN reaction causing severe rash involving mucus membranes or skin necrosis: No Has patient had a PCN reaction that required hospitalization: No Has patient had a PCN reaction occurring within the last 10 years: No If all of the above answers are "NO", then may proceed with Cephalosporin use.    Penicillin G Anaphylaxis    Gets extremely high fevers. Has patient had a PCN reaction causing immediate rash, facial/tongue/throat swelling, SOB or lightheadedness with hypotension: Yes Has patient had a PCN reaction causing severe rash involving mucus membranes or skin necrosis: No Has patient had a PCN reaction that required hospitalization: No Has patient had a PCN reaction occurring within the last 10 years: No If all of the above answers are "NO", then may proceed with Cephalosporin use.    Sulfa Antibiotics Other (See Comments)    It will kill him   Cephalosporins Other (See Comments)    Reaction  unspecified.   Quinolones Other (See Comments)    Reaction unspecified   Sulfamethoxazole Swelling    MEDICATIONS: Current Outpatient Medications on File Prior to Visit  Medication Sig Dispense Refill   atorvastatin (LIPITOR) 20 MG tablet TAKE 1 TABLET (20 MG TOTAL) BY MOUTH AT BEDTIME. (Patient taking differently: Take 20 mg by mouth at bedtime. ) 30 tablet 0   colesevelam (WELCHOL) 625 MG tablet Take 3 tablets (1,875 mg total) by mouth daily with breakfast. 540 tablet 1   cyclobenzaprine (FLEXERIL) 10 MG tablet Take 1 tablet (10 mg total) by mouth 3 (three) times daily as needed for muscle spasms. 30 tablet 0   diazepam (VALIUM) 5 MG tablet Take 1 tablet (5 mg total) by mouth every 8 (eight) hours as needed for anxiety. (Patient taking differently: Take 5 mg by mouth at bedtime. ) 20 tablet 0   docusate sodium (COLACE) 100 MG capsule Take 200 mg by mouth 2 (two) times daily. Taken at 08:00 and 20:00     gabapentin (NEURONTIN) 300 MG capsule Take 3 capsules (900 mg total) by mouth 2 (two) times daily. 540 capsule 1   Insulin Pen Needle (BD PEN NEEDLE NANO U/F) 32G X 4 MM MISC 1 Package by Does not apply route 2 (two) times daily. 100 each 0   liraglutide (  VICTOZA) 18 MG/3ML SOPN Inject 0.1 mLs (0.6 mg total) into the skin every morning. 1 pen 0   lisinopril-hydrochlorothiazide (ZESTORETIC) 20-12.5 MG tablet Take 1 tablet by mouth daily. 90 tablet 0   metFORMIN (GLUCOPHAGE-XR) 500 MG 24 hr tablet TAKE 2 TABLETS (1,000 MG TOTAL) BY MOUTH 2 (TWO) TIMES DAILY. (Patient taking differently: Take 1,000 mg by mouth 2 (two) times daily. TAKE 2 TABLETS (1,000 MG TOTAL) BY MOUTH 2 (TWO) TIMES DAILY.) 360 tablet 1   NON FORMULARY CPAP machine at bedtime & sleep     No current facility-administered medications on file prior to visit.     PAST MEDICAL HISTORY: Past Medical History:  Diagnosis Date   ADEM (acute disseminated encephalomyelitis)    Allergy    Anxiety 2005    Complication of anesthesia    Constipation    Depression 2005   Diabetes mellitus without complication (HCC)    Type 2   Difficult intubation    difficult intubating and exbutating "throat Clinched around tube" during an Endoscopy procedure   Dyspnea    with exertion   Headache(784.0)    Hearing loss    Hyperinsulinemia 07/04/11   "I'm on metformin cause I produce too much insulin"   Hyperlipidemia    Hypertension    Mononucleosis, infectious, with hepatitis 06/2011   MS (multiple sclerosis) (HCC)    patinet has ADEM not MS per Patient   Nausea    occasional per history form 08/18/11   Neuromuscular disorder (HCC)    Pneumonia 2010   Sleep apnea    uses CPAP at home   Stroke [facility]) 2000   TIA   TIA (transient ischemic attack) 2000   denies residual   TIA (transient ischemic attack)     PAST SURGICAL HISTORY: Past Surgical History:  Procedure Laterality Date   ANTERIOR CERVICAL DECOMP/DISCECTOMY FUSION N/A 10/21/2016   Procedure: ANTERIOR CERVICAL DECOMPRESSION/DISCECTOMY FUSION, INTERBODY PROSTHESIS,PLATE CERVICAL FOUR- CERVICAL FIVE, CERVICAL FIVE- CERVICAL SIX;  Surgeon: Tressie Stalker, MD;  Location: [facility] OR;  Service: Neurosurgery;  Laterality: N/A;   ANTERIOR CERVICAL DECOMP/DISCECTOMY FUSION N/A 05/19/2018   Procedure: ANTERIOR CERVICAL DECOMPRESSION/DISCECTOMY FUSION, INTERBODY PROSTHESIS,PLATE/SCREWS CERVICAL SIX- CERVICAL SEVEN; EXPLORE FUSION;  Surgeon: Tressie Stalker, MD;  Location: [facility] OR;  Service: Neurosurgery;  Laterality: N/A;   CHOLECYSTECTOMY  08/02/2011   Procedure: LAPAROSCOPIC CHOLECYSTECTOMY;  Surgeon: Shelly Rubenstein, MD;  Location: [facility] OR;  Service: General;;   COLONOSCOPY  2003   ESOPHAGOGASTRODUODENOSCOPY  2003   ESOPHAGOGASTRODUODENOSCOPY  07/28/2011   Procedure: ESOPHAGOGASTRODUODENOSCOPY (EGD);  Surgeon: [name], MD;  Location: [facility] ENDOSCOPY;  Service: Endoscopy;  Laterality: N/A;   MYRINGOTOMY  1979; 1980    bilaterally   TONSILLECTOMY AND ADENOIDECTOMY  ~ 1980    SOCIAL HISTORY: Social History   Tobacco Use   Smoking status: Former Smoker    Packs/day: 3.00    Years: 3.00    Pack years: 9.00    Types: Cigarettes    Quit date: 08/18/1987    Years since quitting: 31.7   Smokeless tobacco: Never Used  Substance Use Topics   Alcohol use: Not Currently    Alcohol/week: 0.0 standard drinks    Comment: 07/04/11 "glass of wine or spirits once a month"   Drug use: Not Currently    Types: Marijuana    Comment: have not used in a 1.5 years    FAMILY HISTORY: Family History  Problem Relation Age of Onset   Coronary artery disease Father    Stroke Father  Cancer Father    High blood pressure Father    Alcoholism Father    Diabetes Mother    Obesity Mother    Cancer Maternal Grandmother        bone   Cancer Other        Stomach -Great-great grandmother    ROS: Review of Systems  Constitutional: Negative for weight loss.  Cardiovascular: Negative for chest pain and claudication.  Gastrointestinal: Negative for nausea and vomiting.  Musculoskeletal: Negative for myalgias.       Negative muscle weakness  Endo/Heme/Allergies:       Negative hypoglycemia    PHYSICAL EXAM: Blood pressure (!) 137/93, pulse 90, temperature 98.4 F (36.9 C), temperature source Oral, height 5\' 8"  (1.727 m), weight (!) 303 lb (137.4 kg), SpO2 96 %. Body mass index is 46.07 kg/m. Physical Exam Vitals signs reviewed.  Constitutional:      Appearance: Normal appearance. He is obese.  Cardiovascular:     Rate and Rhythm: Normal rate.     Pulses: Normal pulses.  Pulmonary:     Effort: Pulmonary effort is normal.     Breath sounds: Normal breath sounds.  Musculoskeletal: Normal range of motion.  Skin:    General: Skin is warm and dry.  Neurological:     Mental Status: He is alert and oriented to person, place, and time.  Psychiatric:        Mood and Affect: Mood normal.         Behavior: Behavior normal.     RECENT LABS AND TESTS: BMET    Component Value Date/Time   NA 144 09/26/2018 1126   K 4.0 09/26/2018 1126   CL 98 09/26/2018 1126   CO2 26 09/26/2018 1126   GLUCOSE 85 09/26/2018 1126   GLUCOSE 83 05/13/2018 0957   BUN 17 09/26/2018 1126   CREATININE 1.09 09/26/2018 1126   CREATININE 1.02 08/24/2017 1458   CALCIUM 10.2 09/26/2018 1126   GFRNONAA 80 09/26/2018 1126   GFRNONAA >89 07/18/2015 1656   GFRAA 93 09/26/2018 1126   GFRAA >89 07/18/2015 1656   Lab Results  Component Value Date   HGBA1C 5.6 09/26/2018   HGBA1C 4.9 05/19/2018   HGBA1C 5.0 05/13/2018   HGBA1C 5.5 02/16/2018   HGBA1C 5.7 (H) 09/15/2017   Lab Results  Component Value Date   INSULIN 76.8 (H) 09/26/2018   INSULIN 60.7 (H) 02/16/2018   INSULIN 110.6 (H) 09/15/2017   CBC    Component Value Date/Time   WBC 6.1 09/26/2018 1126   WBC 5.0 05/13/2018 0957   RBC 6.88 (H) 09/26/2018 1126   RBC 5.71 05/13/2018 0957   HGB 18.2 (H) 09/26/2018 1126   HCT 55.8 (H) 09/26/2018 1126   PLT 161 05/13/2018 0957   PLT 158 09/17/2016 1611   MCV 81 09/26/2018 1126   MCH 26.5 (L) 09/26/2018 1126   MCH 26.8 05/13/2018 0957   MCHC 32.6 09/26/2018 1126   MCHC 32.6 05/13/2018 0957   RDW 15.3 09/26/2018 1126   LYMPHSABS 2.2 09/26/2018 1126   MONOABS 0.5 12/05/2014 1538   EOSABS 0.1 09/26/2018 1126   BASOSABS 0.0 09/26/2018 1126   Iron/TIBC/Ferritin/ %Sat No results found for: IRON, TIBC, FERRITIN, IRONPCTSAT Lipid Panel     Component Value Date/Time   CHOL 121 09/26/2018 1126   TRIG 250 (H) 09/26/2018 1126   HDL 32 (L) 09/26/2018 1126   CHOLHDL 3.4 08/24/2017 1458   VLDL 70 (H) 10/15/2015 1112   LDLCALC 39 09/26/2018 1126  LDLCALC 57 08/24/2017 1458   Hepatic Function Panel     Component Value Date/Time   PROT 7.4 09/26/2018 1126   ALBUMIN 4.9 09/26/2018 1126   AST 38 09/26/2018 1126   ALT 93 (H) 09/26/2018 1126   ALKPHOS 70 09/26/2018 1126   BILITOT 0.6 09/26/2018  1126   BILIDIR 0.1 04/11/2015 1638   IBILI 0.6 04/11/2015 1638      Component Value Date/Time   TSH 2.200 09/26/2018 1126   TSH 2.080 09/15/2017 0902   TSH 3.190 09/17/2016 1611      OBESITY BEHAVIORAL INTERVENTION VISIT  Today's visit was # 30   Starting weight: 305 lbs Starting date: 09/15/17 Today's weight : 303 lbs Today's date: 05/15/2019 Total lbs lost to date: 2 At least 15 minutes were spent on discussing the following behavioral intervention visit.   ASK: We discussed the diagnosis of obesity with Dahlia ByesMichael F [name] today and Brandon [name] agreed to give us permission to discuss obesity behavioral modification therapy today.  ASSESS: Brandon [name] has the diagnosis of obesity and his BMI today is 46.08 Brandon [name] is in the action stage of change   ADVISE: Brandon [name] was educated on the multiple health risks of obesity as well as the benefit of weight loss to improve his health. He was advised of the need for long term treatment and the importance of lifestyle modifications to improve his current health and to decrease his risk of future health problems.  AGREE: Multiple dietary modification options and treatment options were discussed and  Brandon [name] agreed to follow the recommendations documented in the above note.  ARRANGE: Brandon [name] was educated on the importance of frequent visits to treat obesity as outlined per CMS and USPSTF guidelines and agreed to schedule his next follow up appointment today.  I, Burt KnackSharon Martin, am acting as transcriptionist for Quillian Quincearen Teea Ducey, MD  I have reviewed the above documentation for accuracy and completeness, and I agree with the above. -Quillian Quincearen Zackaria Burkey, MD

## 2019-05-16 NOTE — Progress Notes (Signed)
PATIENT: Brandon [name] DOB: 07/18/1970  REASON FOR VISIT: follow up HISTORY FROM: patient  HISTORY OF PRESENT ILLNESS: Today 05/16/19:  Brandon [name] is a 48 year old male with a history of obstructive sleep apnea on CPAP.  His download indicates that he use his machine nightly for compliance of 100%.  He uses machine greater than 4 hours 81 out of 90 days for compliance of 90%.  On average he uses his machine 8 hours and 2 minutes.  His residual AHI is 1.4 on 5 to 18 cm water with EPR 3.  His leak in the 95th percentile is 25.8 L/min.  He reports that the CPAP is working well for him.  He returns today for an evaluation.He reports CPAP is working well for him.   HISTORY 05/03/18:  Brandon [name] is a 48 year old male with a history of obstructive sleep apnea on CPAP.  He returns today for follow-up.  His CPAP download indicates that he uses machine 30 out of 30 days for compliance of 100%.  He uses machine greater than 4 hours 26 out of 30 days for compliance of 87%.  On average he uses his machine 6 hours and 31 minutes.  His residual AHI is 1.8 on 5 to 20 cm of water with EPR 2.  His leak in the 95th percentile is 26.2.  Patient states that he was supposed to have back surgery for 3 ruptured disc.  He reports there has been issues with his insurance company so his surgery was canceled and he is waiting for it to be rescheduled.  He reports that he is currently taking Valium and Percocet.  Reports that he is taking perocet every 4 hours. Even sets an alarm during the night to wake up and take it. States that he takes Valium every 8 hours.    Reports that he took Percocet at 12:00 prior to this visit.  Upon bringing the patient back to the room the nurse had to use a wheelchair as his gait was unsteady.  The patient states that his gait is unsteady because he is on pain medicine and valium.  REVIEW OF SYSTEMS: Out of a complete 14 system review of symptoms, the patient complains only of the following  symptoms, and all other reviewed systems are negative.  ESS 6 FSS 55 ALLERGIES: Allergies  Allergen Reactions  . Amoxicillin Anaphylaxis    Has patient had a PCN reaction causing immediate rash, facial/tongue/throat swelling, SOB or lightheadedness with hypotension: Yes Has patient had a PCN reaction causing severe rash involving mucus membranes or skin necrosis: No Has patient had a PCN reaction that required hospitalization: No Has patient had a PCN reaction occurring within the last 10 years: No If all of the above answers are "NO", then may proceed with Cephalosporin use.   [name] Penicillin G Anaphylaxis    Gets extremely high fevers. Has patient had a PCN reaction causing immediate rash, facial/tongue/throat swelling, SOB or lightheadedness with hypotension: Yes Has patient had a PCN reaction causing severe rash involving mucus membranes or skin necrosis: No Has patient had a PCN reaction that required hospitalization: No Has patient had a PCN reaction occurring within the last 10 years: No If all of the above answers are "NO", then may proceed with Cephalosporin use.   . Sulfa Antibiotics Other (See Comments)    It will kill him  . Cephalosporins Other (See Comments)    Reaction unspecified.  . Quinolones Other (See Comments)    Reaction  unspecified  . Sulfamethoxazole Swelling    HOME MEDICATIONS: Outpatient Medications Prior to Visit  Medication Sig Dispense Refill  . atorvastatin (LIPITOR) 20 MG tablet TAKE 1 TABLET (20 MG TOTAL) BY MOUTH AT BEDTIME. (Patient taking differently: Take 20 mg by mouth at bedtime. ) 30 tablet 0  . colesevelam (WELCHOL) 625 MG tablet Take 3 tablets (1,875 mg total) by mouth daily with breakfast. 540 tablet 1  . cyclobenzaprine (AMRIX) 15 MG 24 hr capsule Take 15 mg by mouth 2 (two) times daily.    [name]. gabapentin (NEURONTIN) 300 MG capsule Take 3 capsules (900 mg total) by mouth 2 (two) times daily. 540 capsule 1  . Insulin Pen Needle (BD PEN  NEEDLE NANO U/F) 32G X 4 MM MISC 1 Package by Does not apply route 2 (two) times daily. 100 each 0  . liraglutide (VICTOZA) 18 MG/3ML SOPN Inject 0.1 mLs (0.6 mg total) into the skin every morning. 1 pen 0  . lisinopril-hydrochlorothiazide (ZESTORETIC) 20-12.5 MG tablet Take 1 tablet by mouth daily. 90 tablet 0  . metFORMIN (GLUCOPHAGE-XR) 500 MG 24 hr tablet TAKE 2 TABLETS (1,000 MG TOTAL) BY MOUTH 2 (TWO) TIMES DAILY. (Patient taking differently: Take 1,000 mg by mouth 2 (two) times daily. TAKE 2 TABLETS (1,000 MG TOTAL) BY MOUTH 2 (TWO) TIMES DAILY.) 360 tablet 1  . NON FORMULARY CPAP machine at bedtime & sleep    . Vitamin D, Ergocalciferol, (DRISDOL) 1.25 MG (50000 UT) CAPS capsule Take 1 capsule (50,000 Units total) by mouth every 7 (seven) days. 4 capsule 0  . cyclobenzaprine (FLEXERIL) 10 MG tablet Take 1 tablet (10 mg total) by mouth 3 (three) times daily as needed for muscle spasms. 30 tablet 0  . diazepam (VALIUM) 5 MG tablet Take 1 tablet (5 mg total) by mouth every 8 (eight) hours as needed for anxiety. (Patient taking differently: Take 5 mg by mouth at bedtime. ) 20 tablet 0  . docusate sodium (COLACE) 100 MG capsule Take 200 mg by mouth 2 (two) times daily. Taken at 08:00 and 20:00     No facility-administered medications prior to visit.     PAST MEDICAL HISTORY: Past Medical History:  Diagnosis Date  . ADEM (acute disseminated encephalomyelitis)   . Allergy   . Anxiety 2005  . Complication of anesthesia   . Constipation   . Depression 2005  . Diabetes mellitus without complication (HCC)    Type 2  . Difficult intubation    difficult intubating and exbutating "throat Clinched around tube" during an Endoscopy procedure  . Dyspnea    with exertion  . Headache(784.0)   . Hearing loss   . Hyperinsulinemia 07/04/11   "I'm on metformin cause I produce too much insulin"  . Hyperlipidemia   . Hypertension   . Mononucleosis, infectious, with hepatitis 06/2011  . MS (multiple  sclerosis) (HCC)    patinet has ADEM not MS per Patient  . Nausea    occasional per history form 08/18/11  . Neuromuscular disorder (HCC)   . Pneumonia 2010  . Sleep apnea    uses CPAP at home  . Stroke (HCC) 2000   TIA  . TIA (transient ischemic attack) 2000   denies residual  . TIA (transient ischemic attack)     PAST SURGICAL HISTORY: Past Surgical History:  Procedure Laterality Date  . ANTERIOR CERVICAL DECOMP/DISCECTOMY FUSION N/A 10/21/2016   Procedure: ANTERIOR CERVICAL DECOMPRESSION/DISCECTOMY FUSION, INTERBODY PROSTHESIS,PLATE CERVICAL FOUR- CERVICAL FIVE, CERVICAL FIVE- CERVICAL SIX;  Surgeon: Tinnie GensJeffrey  Arnoldo Morale, MD;  Location: [facility];  Service: Neurosurgery;  Laterality: N/A;  . ANTERIOR CERVICAL DECOMP/DISCECTOMY FUSION N/A 05/19/2018   Procedure: ANTERIOR CERVICAL DECOMPRESSION/DISCECTOMY FUSION, INTERBODY PROSTHESIS,PLATE/SCREWS CERVICAL SIX- CERVICAL SEVEN; EXPLORE FUSION;  Surgeon: Newman Pies, MD;  Location: [facility];  Service: Neurosurgery;  Laterality: N/A;  . CHOLECYSTECTOMY  08/02/2011   Procedure: LAPAROSCOPIC CHOLECYSTECTOMY;  Surgeon: Harl Bowie, MD;  Location: [facility];  Service: General;;  . COLONOSCOPY  2003  . ESOPHAGOGASTRODUODENOSCOPY  2003  . ESOPHAGOGASTRODUODENOSCOPY  07/28/2011   Procedure: ESOPHAGOGASTRODUODENOSCOPY (EGD);  Surgeon: Scarlette Shorts, MD;  Location: [facility];  Service: Endoscopy;  Laterality: N/A;  . MYRINGOTOMY  1979; 1980   bilaterally  . TONSILLECTOMY AND ADENOIDECTOMY  ~ 1980    FAMILY HISTORY: Family History  Problem Relation Age of Onset  . Coronary artery disease Father   . Stroke Father   . Cancer Father   . High blood pressure Father   . Alcoholism Father   . Diabetes Mother   . Obesity Mother   . Cancer Maternal Grandmother        bone  . Cancer Other        Stomach -Great-great grandmother    SOCIAL HISTORY: Social History   Socioeconomic History  . Marital status: Married    Spouse name: Not on file  .  Number of children: 0  . Years of education: college  . Highest education level: Not on file  Occupational History  . Occupation: disabled   Social Needs  . Financial resource strain: Not on file  . Food insecurity    Worry: Not on file    Inability: Not on file  . Transportation needs    Medical: Not on file    Non-medical: Not on file  Tobacco Use  . Smoking status: Former Smoker    Packs/day: 3.00    Years: 3.00    Pack years: 9.00    Types: Cigarettes    Quit date: 08/18/1987    Years since quitting: 31.7  . Smokeless tobacco: Never Used  Substance and Sexual Activity  . Alcohol use: Not Currently    Alcohol/week: 0.0 standard drinks    Comment: 07/04/11 "glass of wine or spirits once a month"  . Drug use: Not Currently    Types: Marijuana    Comment: have not used in a 1.5 years  . Sexual activity: Yes    Partners: Female  Lifestyle  . Physical activity    Days per week: Not on file    Minutes per session: Not on file  . Stress: Not on file  Relationships  . Social Herbalist on phone: Not on file    Gets together: Not on file    Attends religious service: Not on file    Active member of club or organization: Not on file    Attends meetings of clubs or organizations: Not on file    Relationship status: Not on file  . Intimate partner violence    Fear of current or ex partner: Not on file    Emotionally abused: Not on file    Physically abused: Not on file    Forced sexual activity: Not on file  Other Topics Concern  . Not on file  Social History Narrative   Lives in [place] with 2 domestic male partners "his family unit"   Caffeine: minimal      PHYSICAL EXAM  Vitals:   05/16/19 1308  BP: 138/88  Pulse:  83  Temp: (!) 97.5 F (36.4 C)  TempSrc: Oral  Weight: (!) 307 lb 6.4 oz (139.4 kg)  Height: 5\' 8"  (1.727 m)   Body mass index is 46.74 kg/m.  Generalized: Well developed, in no acute distress  Chest: Lungs clear to  auscultation bilaterally  Neurological examination  Mentation: Alert oriented to time, place, history taking. Follows all commands speech and language fluent Cranial nerve II-XII: Extraocular movements were full, visual field were full on confrontational test Head turning and shoulder shrug  were normal and symmetric. Motor: The motor testing reveals 5 over 5 strength of all 4 extremities. Good symmetric motor tone is noted throughout.  Sensory: Sensory testing is intact to soft touch on all 4 extremities. No evidence of extinction is noted.  Gait and station: Gait is normal.    DIAGNOSTIC DATA (LABS, IMAGING, TESTING) - I reviewed patient records, labs, notes, testing and imaging myself where available.  Lab Results  Component Value Date   WBC 6.0 05/15/2019   HGB 17.3 05/15/2019   HCT 51.1 (H) 05/15/2019   MCV 81 05/15/2019   PLT 163 05/15/2019      Component Value Date/Time   NA 142 05/15/2019 1615   K 3.6 05/15/2019 1615   CL 102 05/15/2019 1615   CO2 25 05/15/2019 1615   GLUCOSE 77 05/15/2019 1615   GLUCOSE 83 05/13/2018 0957   BUN 21 05/15/2019 1615   CREATININE 0.91 05/15/2019 1615   CREATININE 1.02 08/24/2017 1458   CALCIUM 9.8 05/15/2019 1615   PROT 7.0 05/15/2019 1615   ALBUMIN 4.5 05/15/2019 1615   AST 35 05/15/2019 1615   ALT 92 (H) 05/15/2019 1615   ALKPHOS 70 05/15/2019 1615   BILITOT 0.8 05/15/2019 1615   GFRNONAA 100 05/15/2019 1615   GFRNONAA >89 07/18/2015 1656   GFRAA 116 05/15/2019 1615   GFRAA >89 07/18/2015 1656   Lab Results  Component Value Date   CHOL 108 05/15/2019   HDL 33 (L) 05/15/2019   LDLCALC 38 05/15/2019   TRIG 238 (H) 05/15/2019   CHOLHDL 3.4 08/24/2017   Lab Results  Component Value Date   HGBA1C 6.1 (H) 05/15/2019   Lab Results  Component Value Date   VITAMINB12 502 09/26/2018   Lab Results  Component Value Date   TSH 2.200 09/26/2018      ASSESSMENT AND PLAN 48 y.o. year old male  has a past medical history of  ADEM (acute disseminated encephalomyelitis), Allergy, Anxiety (2005), Complication of anesthesia, Constipation, Depression (2005), Diabetes mellitus without complication (HCC), Difficult intubation, Dyspnea, Headache(784.0), Hearing loss, Hyperinsulinemia (07/04/11), Hyperlipidemia, Hypertension, Mononucleosis, infectious, with hepatitis (06/2011), MS (multiple sclerosis) (HCC), Nausea, Neuromuscular disorder (HCC), Pneumonia (2010), Sleep apnea, Stroke (HCC) (2000), TIA (transient ischemic attack) (2000), and TIA (transient ischemic attack). here with :  1. Obstructive sleep apnea on CPAP  The patient's CPAP download shows excellent compliance and good treatment of his apnea.  He is encouraged to continue using CPAP nightly and greater than 4 hours each night.  He is advised that if his symptoms worsen or he develops new symptoms he should let [birth year]-1981 know.  He will follow-up in 1 year or sooner if needed   I spent 15 minutes with the patient. 50% of this time was spent reviewing cpap download.    [place], MSN, NP-C 05/16/2019, 1:31 PM [facility] [address], [address] [place], [place] [phone]

## 2019-05-17 NOTE — Progress Notes (Addendum)
Office: [phone]  /  Fax: [phone]    Date: May 30, 2019    Appointment Start Time: 12:02pm Duration: 30 minutes Provider: Glennie Isle, Psy.D. Type of Session: Individual Therapy  Location of Patient: Home Location of Provider: [facility] Type of Contact: Telepsychological Visit via [place] only  Session Content: Of note, this provider called Brandon [name] at 12:02pm as he did not present for the [facility] appointment. He noted he was joining. As such, today's appointment was initiated 2 minutes late. Brandon [name] is a 48 y.o. male presenting via [place] (audio only on Brandon [name]'s end) for a follow-up appointment to address the previously established treatment goal of decreasing emotional eating. Today's appointment was a telepsychological visit, as it is an option for appointments to reduce exposure to COVID-19. Brandon [name] expressed understanding regarding the rationale for telepsychological services, and provided verbal consent for today's appointment. Prior to proceeding with today's appointment, Brandon [name]'s physical location at the time of this appointment was obtained. In the event of technical difficulties, Brandon [name] shared a phone number he could be reached at. Brandon [name] and this provider participated in today's telepsychological service. Also, Brandon [name] denied anyone else being present in the room or on the WebEx appointment.  This provider conducted a brief check-in and verbally administered the PHQ-9 and GAD-7. A risk assessment was completed. Brandon [name] denied experiencing suicidal and homicidal ideation, plan, and intent since the last appointment with this provider. He continues to acknowledge understanding regarding the importance of reaching out to trusted individuals and/or emergency resources if he is unable to ensure safety.   Brandon [name] shared he is not feeling well and noted experiencing nausea, which he reportedly has discussed with Brandon [name] in the past.  It was recommended he contact his PCP if symptoms worsen and/or persist; he agreed. While he continues to experience worry and stress about a family member, he noted, "I'm doing alright." He noted he has not scheduled an appointment with Brandon [name] yet and noted, "It's been a lot of phone tag." He was again encouraged to call and schedule an appointment with her as previously discussed. Brandon [name] stated he wrote on his calendar to call and schedule an appointment today. [name] focused on eating habits and coping. Brandon [name] reported, "I got a Brandon cereal crazy." However, he reported a reduction in "stress eating." He further noted he recently removed foods that are "temptations." Psychoeducation regarding the hunger and satisfaction scale was provided to further assist with eating habits. Brandon [name] provided verbal consent during today's appointment for this provider to send the hunger and satisfaction scale via e-mail. He noted, "That sounds like that will be another tool that I can put with my other tools." Brandon [name] activities were also reviewed. He reported he is helping a family member clean, which he described as helpful. [name] further shared he has played video games and watched movies since the last appointment with this provider. Brandon [name] was receptive to today's session as evidenced by openness to sharing, responsiveness to feedback, and willingness to utilize the hunger and satisfaction scale.  Mental Status Examination:  Appearance: unable to assess  Behavior: unable to assess Mood: euthymic Affect: unable to fully assess Speech: normal in rate, volume, and tone Eye Contact: unable to assess Psychomotor Activity: unable to assess Thought Process: linear, logical, and goal directed  Content/Perceptual Disturbances: denies suicidal and homicidal ideation, plan, and intent and no hallucinations, delusions, bizarre thinking or behavior reported or observed Orientation: time, person, place  and purpose of appointment  Cognition/Sensorium: memory, attention, language, and fund of knowledge intact  Insight: fair Judgment: fair  Structured Assessment Results: The Patient Health Questionnaire-9 (PHQ-9) is a self-report measure that assesses symptoms and severity of depression over the course of the last two weeks. Brandon [name] obtained a score of 6 suggesting mild depression. Brandon [name] finds the endorsed symptoms to be somewhat difficult. Brandon [name]  Feeling down, depressed, or hopeless [name]  Trouble falling or staying asleep, or sleeping too much 0  Feeling tired or having Brandon energy 2  Poor appetite or overeating [name]  Feeling bad about yourself --- or that you are a failure or have let yourself or your family down 0  Trouble concentrating on things, such as reading the newspaper or watching television 0  Moving or speaking so slowly that other people could have noticed? Or the opposite --- being so fidgety or restless that you have been moving around a lot more than usual [name]  Thoughts that you would be better off dead or hurting yourself in some way 0  PHQ-9 Score 6    The Generalized Anxiety Disorder-7 (GAD-7) is a brief self-report measure that assesses symptoms of anxiety over the course of the last two weeks. Brandon [name] obtained a score of 4 suggesting minimal anxiety. Brandon [name] finds the endorsed symptoms to be not difficult at all. Feeling nervous, anxious, on edge 2  Not being able to stop or control worrying [name]  Worrying too much about different things 0  Trouble relaxing [name]  Being so restless that it's hard to sit still 0  Becoming easily annoyed or irritable 0  Feeling afraid as if something awful might happen 0  GAD-7 Score 4   Interventions:  Conducted a brief chart review Verbal administration of PHQ-9 and GAD-7 for symptom monitoring Provided empathic reflections and validation Reviewed content from the previous session Employed motivational  interviewing skills to assess patient's willingness/desire to adhere to recommended medical treatments and assignments Psychoeducation provided regarding the hunger and satisfaction scale Employed supportive psychotherapy interventions to facilitate reduced distress, and to improve coping skills with identified stressors  DSM-5 Diagnosis: 311 (F32.8) Other Specified Depressive Disorder, Emotional Eating Behaviors  Treatment Goal & Progress: During the initial appointment with this provider, the following treatment goal was established: decrease emotional eating. Tobby has demonstrated progress in his goal as evidenced by increased awareness of hunger patterns and triggers for emotional eating. Emanuele also reported a reduction in emotional eating.  Plan: Bookert continues to appear able and willing to participate as evidenced by engagement in reciprocal conversation, and asking questions for clarification as appropriate. The next appointment will be scheduled in three weeks, which will be in-person per Cortlan's request. The next session will focus on reviewing learned skills, and working towards the established treatment goal.

## 2019-05-26 DIAGNOSIS — M5020 Other cervical disc displacement, unspecified cervical region: Secondary | ICD-10-CM | POA: Diagnosis not present

## 2019-05-26 DIAGNOSIS — I10 Essential (primary) hypertension: Secondary | ICD-10-CM | POA: Diagnosis not present

## 2019-05-26 DIAGNOSIS — S129XXA Fracture of neck, unspecified, initial encounter: Secondary | ICD-10-CM | POA: Diagnosis not present

## 2019-05-30 ENCOUNTER — Other Ambulatory Visit: Payer: Self-pay

## 2019-05-30 ENCOUNTER — Ambulatory Visit (INDEPENDENT_AMBULATORY_CARE_PROVIDER_SITE_OTHER): Payer: 59 | Admitting: Psychology

## 2019-05-30 DIAGNOSIS — F3289 Other specified depressive episodes: Secondary | ICD-10-CM

## 2019-06-05 ENCOUNTER — Encounter (INDEPENDENT_AMBULATORY_CARE_PROVIDER_SITE_OTHER): Payer: Self-pay | Admitting: Family Medicine

## 2019-06-05 ENCOUNTER — Other Ambulatory Visit: Payer: Self-pay

## 2019-06-05 ENCOUNTER — Ambulatory Visit (INDEPENDENT_AMBULATORY_CARE_PROVIDER_SITE_OTHER): Payer: 59 | Admitting: Family Medicine

## 2019-06-05 VITALS — BP 134/85 | HR 75 | Temp 98.0°F | Ht 68.0 in | Wt 308.0 lb

## 2019-06-05 DIAGNOSIS — E119 Type 2 diabetes mellitus without complications: Secondary | ICD-10-CM | POA: Diagnosis not present

## 2019-06-05 DIAGNOSIS — Z6841 Body Mass Index (BMI) 40.0 and over, adult: Secondary | ICD-10-CM | POA: Diagnosis not present

## 2019-06-05 NOTE — Progress Notes (Signed)
Office: [phone]  /  Fax: [phone]   HPI:   Chief Complaint: OBESITY Brandon [name] is here to discuss his progress with his obesity treatment plan. He is on the Category 4 plan and is following his eating plan approximately 40 % of the time. He states he is walking and doing light cleaning for 5 minutes 5 times per week. Brandon [name] continues to struggle with weight gain, although most of his gain this visit is water weight and is likely from increased Na+ rich foods over Thanksgiving. He still notes stress eating especially related to the current political environment and [place].  His weight is (!) 308 lb (139.7 kg) today and has gained 5 lbs since his last visit. He has lost 0 lbs since starting treatment with [place].  Diabetes II Stanford has a diagnosis of diabetes type II. Labs were discussed today. Brandon [name]'s A1c has worsened from 5.6 to 6.1. he has missed a lot of his Victoza due to forgetting to take it and weight gain. He wants to work on intensive lifestyle modifications including diet, exercise, and weight loss to help control his blood glucose levels.  ASSESSMENT AND PLAN:  Type 2 diabetes mellitus without complication, without long-term current use of insulin (HCC)  Class 3 severe obesity with serious comorbidity and body mass index (BMI) of 45.0 to 49.9 in adult, unspecified obesity type ([place])  PLAN:  Diabetes II Jonn has been given extensive diabetes education by myself today including ideal fasting and post-prandial blood glucose readings, individual ideal Hgb A1c goals and hypoglycemia prevention. We discussed the importance of good blood sugar control to decrease the likelihood of diabetic complications such as nephropathy, neuropathy, limb loss, blindness, coronary artery disease, and death. We discussed the importance of intensive lifestyle modification including diet, exercise and weight loss as the first line treatment for diabetes. Torez was strongly encouraged to  restart Victoza and will continue to follow up. He will continue his diet and weight loss efforts. No refill is needed today.  I spent > than 50% of the 17 minute visit on counseling as documented in the note.  Obesity Brandon [name] is currently in the action stage of change. As such, his goal is to continue with weight loss efforts He has agreed to follow the Category 4 plan Brandon [name] has been instructed to work up to a goal of 150 minutes of combined cardio and strengthening exercise per week for weight loss and overall health benefits. We discussed the following Behavioral Modification Strategies today: increasing lean protein intake, work on meal planning and easy cooking plans and holiday eating strategies    Brandon [name] has agreed to follow up with our clinic in 4 weeks. He was informed of the importance of frequent follow up visits to maximize his success with intensive lifestyle modifications for his multiple health conditions.  ALLERGIES: Allergies  Allergen Reactions   Amoxicillin Anaphylaxis    Has patient had a PCN reaction causing immediate rash, facial/tongue/throat swelling, SOB or lightheadedness with hypotension: Yes Has patient had a PCN reaction causing severe rash involving mucus membranes or skin necrosis: No Has patient had a PCN reaction that required hospitalization: No Has patient had a PCN reaction occurring within the last 10 years: No If all of the above answers are "NO", then may proceed with Cephalosporin use.    Penicillin G Anaphylaxis    Gets extremely high fevers. Has patient had a PCN reaction causing immediate rash, facial/tongue/throat swelling, SOB or lightheadedness with hypotension: Yes Has patient had a  PCN reaction causing severe rash involving mucus membranes or skin necrosis: No Has patient had a PCN reaction that required hospitalization: No Has patient had a PCN reaction occurring within the last 10 years: No If all of the above answers are "NO", then  may proceed with Cephalosporin use.    Sulfa Antibiotics Other (See Comments)    It will kill him   Cephalosporins Other (See Comments)    Reaction unspecified.   Quinolones Other (See Comments)    Reaction unspecified   Sulfamethoxazole Swelling    MEDICATIONS: Current Outpatient Medications on File Prior to Visit  Medication Sig Dispense Refill   atorvastatin (LIPITOR) 20 MG tablet TAKE 1 TABLET (20 MG TOTAL) BY MOUTH AT BEDTIME. (Patient taking differently: Take 20 mg by mouth at bedtime. ) 30 tablet 0   colesevelam (WELCHOL) 625 MG tablet Take 3 tablets (1,875 mg total) by mouth daily with breakfast. 540 tablet 1   cyclobenzaprine (AMRIX) 15 MG 24 hr capsule Take 15 mg by mouth 2 (two) times daily.     gabapentin (NEURONTIN) 300 MG capsule Take 3 capsules (900 mg total) by mouth 2 (two) times daily. 540 capsule 1   Insulin Pen Needle (BD PEN NEEDLE NANO U/F) 32G X 4 MM MISC 1 Package by Does not apply route 2 (two) times daily. 100 each 0   liraglutide (VICTOZA) 18 MG/3ML SOPN Inject 0.1 mLs (0.6 mg total) into the skin every morning. 1 pen 0   lisinopril-hydrochlorothiazide (ZESTORETIC) 20-12.5 MG tablet Take 1 tablet by mouth daily. 90 tablet 0   metFORMIN (GLUCOPHAGE-XR) 500 MG 24 hr tablet TAKE 2 TABLETS (1,000 MG TOTAL) BY MOUTH 2 (TWO) TIMES DAILY. (Patient taking differently: Take 1,000 mg by mouth 2 (two) times daily. TAKE 2 TABLETS (1,000 MG TOTAL) BY MOUTH 2 (TWO) TIMES DAILY.) 360 tablet 1   NON FORMULARY CPAP machine at bedtime & sleep     Vitamin D, Ergocalciferol, (DRISDOL) 1.25 MG (50000 UT) CAPS capsule Take 1 capsule (50,000 Units total) by mouth every 7 (seven) days. 4 capsule 0   No current facility-administered medications on file prior to visit.     PAST MEDICAL HISTORY: Past Medical History:  Diagnosis Date   ADEM (acute disseminated encephalomyelitis)    Allergy    Anxiety 2005   Complication of anesthesia    Constipation     Depression 2005   Diabetes mellitus without complication (HCC)    Type 2   Difficult intubation    difficult intubating and exbutating "throat Clinched around tube" during an Endoscopy procedure   Dyspnea    with exertion   Headache(784.0)    Hearing loss    Hyperinsulinemia 07/04/11   "I'm on metformin cause I produce too much insulin"   Hyperlipidemia    Hypertension    Mononucleosis, infectious, with hepatitis 06/2011   MS (multiple sclerosis) (HCC)    patinet has ADEM not MS per Patient   Nausea    occasional per history form 08/18/11   Neuromuscular disorder (HCC)    Pneumonia 2010   Sleep apnea    uses CPAP at home   Stroke [facility](HCC) 2000   TIA   TIA (transient ischemic attack) 2000   denies residual   TIA (transient ischemic attack)     PAST SURGICAL HISTORY: Past Surgical History:  Procedure Laterality Date   ANTERIOR CERVICAL DECOMP/DISCECTOMY FUSION N/A 10/21/2016   Procedure: ANTERIOR CERVICAL DECOMPRESSION/DISCECTOMY FUSION, INTERBODY PROSTHESIS,PLATE CERVICAL FOUR- CERVICAL FIVE, CERVICAL FIVE- CERVICAL SIX;  Surgeon: Tressie Stalker, MD;  [facility] OR;  Service: Neurosurgery;  Laterality: N/A;   ANTERIOR CERVICAL DECOMP/DISCECTOMY FUSION N/A 05/19/2018   Procedure: ANTERIOR CERVICAL DECOMPRESSION/DISCECTOMY FUSION, INTERBODY PROSTHESIS,PLATE/SCREWS CERVICAL SIX- CERVICAL SEVEN; EXPLORE FUSION;  Surgeon: Tressie Stalker, MD;  Location: [facility] OR;  Service: Neurosurgery;  Laterality: N/A;   CHOLECYSTECTOMY  08/02/2011   Procedure: LAPAROSCOPIC CHOLECYSTECTOMY;  Surgeon: Shelly Rubenstein, MD;  Location: [facility] OR;  Service: General;;   COLONOSCOPY  2003   ESOPHAGOGASTRODUODENOSCOPY  2003   ESOPHAGOGASTRODUODENOSCOPY  07/28/2011   Procedure: ESOPHAGOGASTRODUODENOSCOPY (EGD);  Surgeon: [name], MD;  Location[facility] ENDOSCOPY;  Service: Endoscopy;  Laterality: N/A;   MYRINGOTOMY  1979; 1980   bilaterally   TONSILLECTOMY AND ADENOIDECTOMY  ~ 1980     SOCIAL HISTORY: Social History   Tobacco Use   Smoking status: Former Smoker    Packs/day: 3.00    Years: 3.00    Pack years: 9.00    Types: Cigarettes    Quit date: 08/18/1987    Years since quitting: 31.8   Smokeless tobacco: Never Used  Substance Use Topics   Alcohol use: Not Currently    Alcohol/week: 0.0 standard drinks    Comment: 07/04/11 "glass of wine or spirits once a month"   Drug use: Not Currently    Types: Marijuana    Comment: have not used in a 1.5 years    FAMILY HISTORY: Family History  Problem Relation Age of Onset   Coronary artery disease Father    Stroke Father    Cancer Father    High blood pressure Father    Alcoholism Father    Diabetes Mother    Obesity Mother    Cancer Maternal Grandmother        bone   Cancer Other        Stomach -Great-great grandmother    ROS: Review of Systems  Constitutional: Negative for weight loss.    PHYSICAL EXAM: Blood pressure 134/85, pulse 75, temperature 98 F (36.7 C), temperature source Oral, height 5\' 8"  (1.727 m), weight (!) 308 lb (139.7 kg), SpO2 96 %. Body mass index is 46.83 kg/m. Physical Exam Vitals signs reviewed.  Constitutional:      Appearance: Normal appearance. He is obese.  Cardiovascular:     Rate and Rhythm: Normal rate.     Pulses: Normal pulses.  Pulmonary:     Effort: Pulmonary effort is normal.     Breath sounds: Normal breath sounds.  Musculoskeletal: Normal range of motion.  Skin:    General: Skin is warm and dry.  Neurological:     Mental Status: He is alert and oriented to person, place, and time.  Psychiatric:        Mood and Affect: Mood normal.        Behavior: Behavior normal.     RECENT LABS AND TESTS: BMET    Component Value Date/Time   NA 142 05/15/2019 1615   K 3.6 05/15/2019 1615   CL 102 05/15/2019 1615   CO2 25 05/15/2019 1615   GLUCOSE 77 05/15/2019 1615   GLUCOSE 83 05/13/2018 0957   BUN 21 05/15/2019 1615   CREATININE 0.91  05/15/2019 1615   CREATININE 1.02 08/24/2017 1458   CALCIUM 9.8 05/15/2019 1615   GFRNONAA 100 05/15/2019 1615   GFRNONAA >89 07/18/2015 1656   GFRAA 116 05/15/2019 1615   GFRAA >89 07/18/2015 1656   Lab Results  Component Value Date   HGBA1C 6.1 (H) 05/15/2019   HGBA1C 5.6  09/26/2018   HGBA1C 4.9 05/19/2018   HGBA1C 5.0 05/13/2018   HGBA1C 5.5 02/16/2018   Lab Results  Component Value Date   INSULIN 94.0 (H) 05/15/2019   INSULIN 76.8 (H) 09/26/2018   INSULIN 60.7 (H) 02/16/2018   INSULIN 110.6 (H) 09/15/2017   CBC    Component Value Date/Time   WBC 6.0 05/15/2019 1615   WBC 5.0 05/13/2018 0957   RBC 6.34 (H) 05/15/2019 1615   RBC 5.71 05/13/2018 0957   HGB 17.3 05/15/2019 1615   HCT 51.1 (H) 05/15/2019 1615   PLT 163 05/15/2019 1615   MCV 81 05/15/2019 1615   MCH 27.3 05/15/2019 1615   MCH 26.8 05/13/2018 0957   MCHC 33.9 05/15/2019 1615   MCHC 32.6 05/13/2018 0957   RDW 15.0 05/15/2019 1615   LYMPHSABS 2.1 05/15/2019 1615   MONOABS 0.5 12/05/2014 1538   EOSABS 0.1 05/15/2019 1615   BASOSABS 0.0 05/15/2019 1615   Iron/TIBC/Ferritin/ %Sat No results found for: IRON, TIBC, FERRITIN, IRONPCTSAT Lipid Panel     Component Value Date/Time   CHOL 108 05/15/2019 1615   TRIG 238 (H) 05/15/2019 1615   HDL 33 (L) 05/15/2019 1615   CHOLHDL 3.4 08/24/2017 1458   VLDL 70 (H) 10/15/2015 1112   LDLCALC 38 05/15/2019 1615   LDLCALC 57 08/24/2017 1458   Hepatic Function Panel     Component Value Date/Time   PROT 7.0 05/15/2019 1615   ALBUMIN 4.5 05/15/2019 1615   AST 35 05/15/2019 1615   ALT 92 (H) 05/15/2019 1615   ALKPHOS 70 05/15/2019 1615   BILITOT 0.8 05/15/2019 1615   BILIDIR 0.1 04/11/2015 1638   IBILI 0.6 04/11/2015 1638      Component Value Date/Time   TSH 2.200 09/26/2018 1126   TSH 2.080 09/15/2017 0902   TSH 3.190 09/17/2016 1611      OBESITY BEHAVIORAL INTERVENTION VISIT  Today's visit was # 31   Starting weight: 305 lbs Starting date:  09/15/17 Today's weight : 308 lbs Today's date: 06/05/2019 Total lbs lost to date: 0    ASK: We discussed the diagnosis of obesity with Dahlia Byes today and [name] agreed to give [place] permission to discuss obesity behavioral modification therapy today.  ASSESS: Finnick has the diagnosis of obesity and his BMI today is 46.84 Jwan is in the action stage of change   ADVISE: Harol was educated on the multiple health risks of obesity as well as the benefit of weight loss to improve his health. He was advised of the need for long term treatment and the importance of lifestyle modifications to improve his current health and to decrease his risk of future health problems.  AGREE: Multiple dietary modification options and treatment options were discussed and  Jo agreed to follow the recommendations documented in the above note.  ARRANGE: Mckenzie was educated on the importance of frequent visits to treat obesity as outlined per CMS and USPSTF guidelines and agreed to schedule his next follow up appointment today.  I, Burt Knack, am acting as transcriptionist for Quillian Quince, MD  I have reviewed the above documentation for accuracy and completeness, and I agree with the above. -Quillian Quince, MD

## 2019-06-12 NOTE — Progress Notes (Signed)
Entered in error

## 2019-06-20 ENCOUNTER — Encounter (INDEPENDENT_AMBULATORY_CARE_PROVIDER_SITE_OTHER): Payer: Self-pay | Admitting: Psychology

## 2019-06-20 ENCOUNTER — Telehealth (INDEPENDENT_AMBULATORY_CARE_PROVIDER_SITE_OTHER): Payer: Self-pay | Admitting: Psychology

## 2019-06-20 NOTE — Telephone Encounter (Signed)
  Office: [phone]  /  Fax: [phone]  Date of Contact: June 20, 2019  Time of Contact (via WebEx-audio only): 2:00pm Duration of Contact: 8 minutes Provider: Glennie Isle, PsyD  CONTENT: Legrand Como presented for today's appointment via WebEx; however, shared he is scheduled for an appointment with his other therapist [name]) at 3:00pm. As such, this provider shared concern about insurance coverage and Elhadji requested to re-schedule the appointment with this provider and keep his appointment with his other therapist. A risk assessment was completed. Wyatte denied experiencing suicidal and homicidal ideation, plan, and intent since the last appointment with this provider. He also denied experiencing ideation of self-harm.   PLAN: Due to the upcoming holidays and this provider being out of the office in the coming weeks, Beecher is scheduled for an appointment on July 19, 2019 at 2:00pm. He reported a plan to continue meeting with his other therapist regularly.

## 2019-06-27 ENCOUNTER — Encounter (INDEPENDENT_AMBULATORY_CARE_PROVIDER_SITE_OTHER): Payer: Self-pay | Admitting: Family Medicine

## 2019-06-27 ENCOUNTER — Other Ambulatory Visit: Payer: Self-pay

## 2019-06-27 ENCOUNTER — Telehealth (INDEPENDENT_AMBULATORY_CARE_PROVIDER_SITE_OTHER): Payer: 59 | Admitting: Family Medicine

## 2019-06-27 DIAGNOSIS — I10 Essential (primary) hypertension: Secondary | ICD-10-CM

## 2019-06-27 DIAGNOSIS — Z6841 Body Mass Index (BMI) 40.0 and over, adult: Secondary | ICD-10-CM

## 2019-06-27 MED ORDER — LISINOPRIL-HYDROCHLOROTHIAZIDE 20-12.5 MG PO TABS: 1.0000 | ORAL_TABLET | Freq: Every day | ORAL | 0 refills | Status: DC

## 2019-07-03 NOTE — Progress Notes (Signed)
Office: [phone]  /  Fax: [phone] TeleHealth Visit:  [name] has verbally consented to this TeleHealth visit today. The patient is located at home, the provider is located at the [employer] [facility] office. The participants in this visit include the listed provider and patient. The visit was conducted today via webex (15 minutes).  HPI:  Chief Complaint: OBESITY Brandon [name] is here to discuss his progress with his obesity treatment plan. He is on the Category 4 plan and states he is following his eating plan approximately 60 % of the time. He states he is on the treadmill for 30 minutes 1 time per week.  Brandon [name] feels he has maintained his weight from his last visit. His hunger is controlled, but there has been some meal planning challenges. He has started going to the gym, but he is going slow due to knee pain.  Hypertension Brandon [name] is a 48 y.o. male with hypertension. He states his blood pressure was 135/90, which is borderline elevated.  ASSESSMENT AND PLAN:  Essential hypertension - Plan: lisinopril-hydrochlorothiazide (ZESTORETIC) 20-12.5 MG tablet  Class 3 severe obesity with serious comorbidity and body mass index (BMI) of 45.0 to 49.9 in adult, unspecified obesity type ([place])  PLAN:  Hypertension Juniper is working on healthy weight loss and exercise to improve blood pressure control. We will watch for signs of hypotension as he continues his lifestyle modifications. Brandon [name] agrees to continue taking lisinopril-hydrochlorothiazide 20-12.5 mg PO daily #90 day supply with no refills. Brandon [name] agrees to follow up with [place] as directed.  Obesity Brandon [name] is currently in the action stage of change. As such, his goal is to continue with weight loss efforts. He has agreed to follow the Category 4 plan + 500 calories Brandon [name] has been instructed to work up to a goal of 150 minutes of combined cardio and strengthening exercise per week or as is but try to increase  to 2 times per week for weight loss and overall health benefits. We discussed the following Behavioral Modification Strategies today: holiday eating strategies    [name] has agreed to follow-up with our clinic in 3 weeks. He was informed of the importance of frequent follow-up visits to maximize his success with intensive lifestyle modifications for his multiple health conditions.  ALLERGIES: Allergies  Allergen Reactions  . Amoxicillin Anaphylaxis    Has patient had a PCN reaction causing immediate rash, facial/tongue/throat swelling, SOB or lightheadedness with hypotension: Yes Has patient had a PCN reaction causing severe rash involving mucus membranes or skin necrosis: No Has patient had a PCN reaction that required hospitalization: No Has patient had a PCN reaction occurring within the last 10 years: No If all of the above answers are "NO", then may proceed with Cephalosporin use.   Brandon [name] Penicillin G Anaphylaxis    Gets extremely high fevers. Has patient had a PCN reaction causing immediate rash, facial/tongue/throat swelling, SOB or lightheadedness with hypotension: Yes Has patient had a PCN reaction causing severe rash involving mucus membranes or skin necrosis: No Has patient had a PCN reaction that required hospitalization: No Has patient had a PCN reaction occurring within the last 10 years: No If all of the above answers are "NO", then may proceed with Cephalosporin use.   . Sulfa Antibiotics Other (See Comments)    It will kill him  . Cephalosporins Other (See Comments)    Reaction unspecified.  . Quinolones Other (See Comments)    Reaction unspecified  . Sulfamethoxazole Swelling  MEDICATIONS: Current Outpatient Medications on File Prior to Visit  Medication Sig Dispense Refill  . atorvastatin (LIPITOR) 20 MG tablet TAKE 1 TABLET (20 MG TOTAL) BY MOUTH AT BEDTIME. (Patient taking differently: Take 20 mg by mouth at bedtime. ) 30 tablet 0  . colesevelam (WELCHOL) 625  MG tablet Take 3 tablets (1,875 mg total) by mouth daily with breakfast. 540 tablet 1  . cyclobenzaprine (AMRIX) 15 MG 24 hr capsule Take 15 mg by mouth 2 (two) times daily.    Brandon [name]. gabapentin (NEURONTIN) 300 MG capsule Take 3 capsules (900 mg total) by mouth 2 (two) times daily. 540 capsule 1  . Insulin Pen Needle (BD PEN NEEDLE NANO U/F) 32G X 4 MM MISC 1 Package by Does not apply route 2 (two) times daily. 100 each 0  . metFORMIN (GLUCOPHAGE-XR) 500 MG 24 hr tablet TAKE 2 TABLETS (1,000 MG TOTAL) BY MOUTH 2 (TWO) TIMES DAILY. (Patient taking differently: Take 1,000 mg by mouth 2 (two) times daily. TAKE 2 TABLETS (1,000 MG TOTAL) BY MOUTH 2 (TWO) TIMES DAILY.) 360 tablet 1  . NON FORMULARY CPAP machine at bedtime & sleep    . Vitamin D, Ergocalciferol, (DRISDOL) 1.25 MG (50000 UT) CAPS capsule Take 1 capsule (50,000 Units total) by mouth every 7 (seven) days. 4 capsule 0  . liraglutide (VICTOZA) 18 MG/3ML SOPN Inject 0.1 mLs (0.6 mg total) into the skin every morning. (Patient not taking: Reported on 06/27/2019) 1 pen 0   No current facility-administered medications on file prior to visit.    PAST MEDICAL HISTORY: Past Medical History:  Diagnosis Date  . ADEM (acute disseminated encephalomyelitis)   . Allergy   . Anxiety 2005  . Complication of anesthesia   . Constipation   . Depression 2005  . Diabetes mellitus without complication (HCC)    Type 2  . Difficult intubation    difficult intubating and exbutating "throat Clinched around tube" during an Endoscopy procedure  . Dyspnea    with exertion  . Headache(784.0)   . Hearing loss   . Hyperinsulinemia 07/04/11   "I'm on metformin cause I produce too much insulin"  . Hyperlipidemia   . Hypertension   . Mononucleosis, infectious, with hepatitis 06/2011  . MS (multiple sclerosis) (HCC)    patinet has ADEM not MS per Patient  . Nausea    occasional per history form 08/18/11  . Neuromuscular disorder (HCC)   . Pneumonia 2010  .  Sleep apnea    uses CPAP at home  . Stroke (HCC) 2000   TIA  . TIA (transient ischemic attack) 2000   denies residual  . TIA (transient ischemic attack)     PAST SURGICAL HISTORY: Past Surgical History:  Procedure Laterality Date  . ANTERIOR CERVICAL DECOMP/DISCECTOMY FUSION N/A 10/21/2016   Procedure: ANTERIOR CERVICAL DECOMPRESSION/DISCECTOMY FUSION, INTERBODY PROSTHESIS,PLATE CERVICAL FOUR- CERVICAL FIVE, CERVICAL FIVE- CERVICAL SIX;  Surgeon: Tressie StalkerJeffrey Jenkins, MD;  Location: [facility] OR;  Service: Neurosurgery;  Laterality: N/A;  . ANTERIOR CERVICAL DECOMP/DISCECTOMY FUSION N/A 05/19/2018   Procedure: ANTERIOR CERVICAL DECOMPRESSION/DISCECTOMY FUSION, INTERBODY PROSTHESIS,PLATE/SCREWS CERVICAL SIX- CERVICAL SEVEN; EXPLORE FUSION;  Surgeon: Tressie StalkerJenkins, Jeffrey, MD;  [facility];  Service: Neurosurgery;  Laterality: N/A;  . CHOLECYSTECTOMY  08/02/2011   Procedure: LAPAROSCOPIC CHOLECYSTECTOMY;  Surgeon: Shelly Rubensteinouglas A Blackman, MD;  Location: [facility] OR;  Service: General;;  . COLONOSCOPY  2003  . ESOPHAGOGASTRODUODENOSCOPY  2003  . ESOPHAGOGASTRODUODENOSCOPY  07/28/2011   Procedure: ESOPHAGOGASTRODUODENOSCOPY (EGD);  Surgeon: Yancey FlemingsJohn Perry, MD;  [facility];  Service: Endoscopy;  Laterality: N/A;  . MYRINGOTOMY  1979; 1980   bilaterally  . TONSILLECTOMY AND ADENOIDECTOMY  ~ 1980    SOCIAL HISTORY: Social History   Tobacco Use  . Smoking status: Former Smoker    Packs/day: 3.00    Years: 3.00    Pack years: 9.00    Types: Cigarettes    Quit date: 08/18/1987    Years since quitting: 31.8  . Smokeless tobacco: Never Used  Substance Use Topics  . Alcohol use: Not Currently    Alcohol/week: 0.0 standard drinks    Comment: 07/04/11 "glass of wine or spirits once a month"  . Drug use: Not Currently    Types: Marijuana    Comment: have not used in a 1.5 years    FAMILY HISTORY: Family History  Problem Relation Age of Onset  . Coronary artery disease Father   . Stroke Father   .  Cancer Father   . High blood pressure Father   . Alcoholism Father   . Diabetes Mother   . Obesity Mother   . Cancer Maternal Grandmother        bone  . Cancer Other        Stomach -Great-great grandmother    ROS: Review of Systems  Constitutional: Negative for weight loss.    PHYSICAL EXAM: There were no vitals taken for this visit. There is no height or weight on file to calculate BMI. Physical Exam  RECENT LABS AND TESTS: BMET    Component Value Date/Time   NA 142 05/15/2019 1615   K 3.6 05/15/2019 1615   CL 102 05/15/2019 1615   CO2 25 05/15/2019 1615   GLUCOSE 77 05/15/2019 1615   GLUCOSE 83 05/13/2018 0957   BUN 21 05/15/2019 1615   CREATININE 0.91 05/15/2019 1615   CREATININE 1.02 08/24/2017 1458   CALCIUM 9.8 05/15/2019 1615   GFRNONAA 100 05/15/2019 1615   GFRNONAA >89 07/18/2015 1656   GFRAA 116 05/15/2019 1615   GFRAA >89 07/18/2015 1656   Lab Results  Component Value Date   HGBA1C 6.1 (H) 05/15/2019   HGBA1C 5.6 09/26/2018   HGBA1C 4.9 05/19/2018   HGBA1C 5.0 05/13/2018   HGBA1C 5.5 02/16/2018   Lab Results  Component Value Date   INSULIN 94.0 (H) 05/15/2019   INSULIN 76.8 (H) 09/26/2018   INSULIN 60.7 (H) 02/16/2018   INSULIN 110.6 (H) 09/15/2017   CBC    Component Value Date/Time   WBC 6.0 05/15/2019 1615   WBC 5.0 05/13/2018 0957   RBC 6.34 (H) 05/15/2019 1615   RBC 5.71 05/13/2018 0957   HGB 17.3 05/15/2019 1615   HCT 51.1 (H) 05/15/2019 1615   PLT 163 05/15/2019 1615   MCV 81 05/15/2019 1615   MCH 27.3 05/15/2019 1615   MCH 26.8 05/13/2018 0957   MCHC 33.9 05/15/2019 1615   MCHC 32.6 05/13/2018 0957   RDW 15.0 05/15/2019 1615   LYMPHSABS 2.1 05/15/2019 1615   MONOABS 0.5 12/05/2014 1538   EOSABS 0.1 05/15/2019 1615   BASOSABS 0.0 05/15/2019 1615   Iron/TIBC/Ferritin/ %Sat No results found for: IRON, TIBC, FERRITIN, IRONPCTSAT Lipid Panel     Component Value Date/Time   CHOL 108 05/15/2019 1615   TRIG 238 (H) 05/15/2019  1615   HDL 33 (L) 05/15/2019 1615   CHOLHDL 3.4 08/24/2017 1458   VLDL 70 (H) 10/15/2015 1112   LDLCALC 38 05/15/2019 1615   LDLCALC 57 08/24/2017 1458   Hepatic Function Panel  Component Value Date/Time   PROT 7.0 05/15/2019 1615   ALBUMIN 4.5 05/15/2019 1615   AST 35 05/15/2019 1615   ALT 92 (H) 05/15/2019 1615   ALKPHOS 70 05/15/2019 1615   BILITOT 0.8 05/15/2019 1615   BILIDIR 0.1 04/11/2015 1638   IBILI 0.6 04/11/2015 1638      Component Value Date/Time   TSH 2.200 09/26/2018 1126   TSH 2.080 09/15/2017 0902   TSH 3.190 09/17/2016 1611     OBESITY BEHAVIORAL INTERVENTION VISIT DOCUMENTATION FOR INSURANCE (~15 minutes)   ASK: We discussed the diagnosis of obesity with Dahlia Byes today and [name] agreed to give [place] permission to discuss obesity behavioral modification therapy today.  ASSESS: Joseff has the diagnosis of obesity  Hovanes is in the action stage of change.   ADVISE: Jarrad was educated on the multiple health risks of obesity as well as the benefit of weight loss to improve his health. He was advised of the need for long term treatment and the importance of lifestyle modifications to improve his current health and to decrease his risk of future health problems.  AGREE: Multiple dietary modification options and treatment options were discussed and  Sewell agreed to follow the recommendations documented in the above note.  ARRANGE: Grayton was educated on the importance of frequent visits to treat obesity as outlined per CMS and USPSTF guidelines and agreed to schedule his next follow up appointment today.    I, Burt Knack, am acting as transcriptionist for Quillian Quince, MD I have reviewed the above documentation for accuracy and completeness, and I agree with the above. -Quillian Quince, MD

## 2019-07-11 NOTE — Progress Notes (Signed)
  Office: [phone]  /  Fax: [phone]    Date: July 19, 2019   Time Seen: 3:04pm Duration: 34 minutes Provider: Lawerance Cruel, Psy.D. Type of Session: Individual Therapy  Type of Contact: Face-to-face  Session Content: Brandon [name] is a 49 y.o. male presenting for a follow-up appointment to address the previously established treatment goal of decreasing emotional eating. The session was initiated a brief check-in. Brandon [name] shared about recent events, including worry about an adult family member. Associated thoughts and feelings were briefly processed, and Brandon [name] discussed what he can do to help (e.g., reach out to other friends/family, recommend resources, provide support), but also acknowledged there are aspects of the situation that are out of his control. In addition, Brandon [name] reported he believes he is maintaining his weight and noted he is going to the gym once a week. He also noted a reduction in emotional eating and discussed focusing on portion control. He further discussed journaling has helped him remember tasks and goals and keep track of information. It was recommended he journal what has helped him be successful with reducing emotional eating for future reference. Overall, Brandon [name] was receptive to today's appointment as evidenced by openness to sharing, responsiveness to feedback, and willingness to engage in learned skills.  Mental Status Examination:  Appearance: well groomed and appropriate hygiene  Behavior: appropriate to circumstances Mood: euthymic Affect: mood congruent Speech: normal in rate, volume, and tone Eye Contact: appropriate Psychomotor Activity: appropriate Gait: ambulates with cane Thought Process: linear, logical, and goal directed  Thought Content/Perception: denies suicidal and homicidal ideation, plan, and intent and no hallucinations, delusions, bizarre thinking or behavior reported or observed Orientation: time, person, place and purpose of  appointment Memory/Concentration: memory, attention, language, and fund of knowledge intact  Insight/Judgment: good  Interventions:  Conducted a brief chart review Provided empathic reflections and validation Employed supportive psychotherapy interventions to facilitate reduced distress and to improve coping skills with identified stressors Employed motivational interviewing skills to assess patient's willingness/desire to adhere to recommended medical treatments and assignments  DSM-5 Diagnosis: 311 (F32.8) Other Specified Depressive Disorder, Emotional Eating Behaviors  Treatment Goal & Progress: During the initial appointment with this provider, the following treatment goal was established: decrease emotional eating. Brandon [name] demonstrated progress in his goal as evidenced by increased awareness of hunger patterns, increased awareness of triggers for emotional eating and reduction in emotional eating. Brandon [name] also continues to demonstrate willingness to engage in learned skill(s).  Plan: Today was Brandon [name]'s last appointment with this provider as he plan to continue meeting with his other therapist, noting his next appointment with her is either next week or the week after. He acknowledged understanding that he may request a follow-up appointment with this provider in the future as long as he is still established with the clinic. No further follow-up planned by this provider.

## 2019-07-13 DIAGNOSIS — E1169 Type 2 diabetes mellitus with other specified complication: Secondary | ICD-10-CM | POA: Diagnosis not present

## 2019-07-13 DIAGNOSIS — I10 Essential (primary) hypertension: Secondary | ICD-10-CM | POA: Diagnosis not present

## 2019-07-13 DIAGNOSIS — E1159 Type 2 diabetes mellitus with other circulatory complications: Secondary | ICD-10-CM | POA: Diagnosis not present

## 2019-07-13 DIAGNOSIS — E119 Type 2 diabetes mellitus without complications: Secondary | ICD-10-CM | POA: Diagnosis not present

## 2019-07-13 DIAGNOSIS — E785 Hyperlipidemia, unspecified: Secondary | ICD-10-CM | POA: Diagnosis not present

## 2019-07-19 ENCOUNTER — Other Ambulatory Visit: Payer: Self-pay

## 2019-07-19 ENCOUNTER — Ambulatory Visit (INDEPENDENT_AMBULATORY_CARE_PROVIDER_SITE_OTHER): Payer: Medicare Other | Admitting: Family Medicine

## 2019-07-19 ENCOUNTER — Encounter (INDEPENDENT_AMBULATORY_CARE_PROVIDER_SITE_OTHER): Payer: Self-pay | Admitting: Family Medicine

## 2019-07-19 ENCOUNTER — Ambulatory Visit (INDEPENDENT_AMBULATORY_CARE_PROVIDER_SITE_OTHER): Payer: Managed Care, Other (non HMO) | Admitting: Psychology

## 2019-07-19 VITALS — BP 143/82 | HR 100 | Temp 98.9°F | Ht 68.0 in | Wt 308.0 lb

## 2019-07-19 DIAGNOSIS — I10 Essential (primary) hypertension: Secondary | ICD-10-CM

## 2019-07-19 DIAGNOSIS — Z6841 Body Mass Index (BMI) 40.0 and over, adult: Secondary | ICD-10-CM

## 2019-07-19 DIAGNOSIS — F3289 Other specified depressive episodes: Secondary | ICD-10-CM

## 2019-07-24 NOTE — Progress Notes (Signed)
Chief Complaint:   OBESITY Brandon [name] is here to discuss his progress with his obesity treatment plan along with follow-up of his obesity related diagnoses. Sultan is on the Category 4 Plan + 500 calories and states he is following his eating plan approximately 50% of the time. Bron states he is on the treadmill at [employer] for 30 minutes 1 time per week.  Today's visit was #: 11 Starting weight: 305 lbs Starting date: 09/15/17 Today's weight: 308 lbs Today's date: 07/19/2019 Total lbs lost to date: 0 Total lbs lost since last in-office visit: 0  Interim History: Danner has done well maintaining his weight over the holidays. He has struggled with additional family stressors, and he struggles to stay on track when he is dealing with stress.  Subjective:   1. Essential hypertension Bueford's blood pressure is elevated today at 143/82 on lisinopril-hydrochlorothiazide. He is working on diet and wants to work on this longer before we adjust his dose.  Assessment/Plan:   1. Essential hypertension Michiah is working on healthy weight loss, diet, and exercise to improve blood pressure control. We will watch for signs of hypotension as he continues his lifestyle modifications. We will recheck labs in 1 month.  2. Class 3 severe obesity with serious comorbidity and body mass index (BMI) of 45.0 to 49.9 in adult, unspecified obesity type [facility]) Hamp is currently in the action stage of change. As such, his goal is to continue with weight loss efforts. He has agreed to on the Category 4 Plan + 500 calories.   Behavioral modification strategies: increasing water intake, meal planning and cooking strategies and emotional eating strategies.  Bryor has agreed to follow-up with our clinic in 4 weeks. He was informed of the importance of frequent follow-up visits to maximize his success with intensive lifestyle modifications for his multiple health conditions.   Objective:   Blood  pressure (!) 143/82, pulse 100, temperature 98.9 F (37.2 C), temperature source Oral, height 5\' 8"  (1.727 m), weight (!) 308 lb (139.7 kg), SpO2 95 %. Body mass index is 46.83 kg/m.  General: Cooperative, alert, well developed, in no acute distress. HEENT: Conjunctivae and lids unremarkable. Cardiovascular: Regular rhythm.  Lungs: Normal work of breathing. Neurologic: No focal deficits.   Lab Results  Component Value Date   CREATININE 0.91 05/15/2019   BUN 21 05/15/2019   NA 142 05/15/2019   K 3.6 05/15/2019   CL 102 05/15/2019   CO2 25 05/15/2019   Lab Results  Component Value Date   ALT 92 (H) 05/15/2019   AST 35 05/15/2019   ALKPHOS 70 05/15/2019   BILITOT 0.8 05/15/2019   Lab Results  Component Value Date   HGBA1C 6.1 (H) 05/15/2019   HGBA1C 5.6 09/26/2018   HGBA1C 4.9 05/19/2018   HGBA1C 5.0 05/13/2018   HGBA1C 5.5 02/16/2018   Lab Results  Component Value Date   INSULIN 94.0 (H) 05/15/2019   INSULIN 76.8 (H) 09/26/2018   INSULIN 60.7 (H) 02/16/2018   INSULIN 110.6 (H) 09/15/2017   Lab Results  Component Value Date   TSH 2.200 09/26/2018   Lab Results  Component Value Date   CHOL 108 05/15/2019   HDL 33 (L) 05/15/2019   LDLCALC 38 05/15/2019   TRIG 238 (H) 05/15/2019   CHOLHDL 3.4 08/24/2017   Lab Results  Component Value Date   WBC 6.0 05/15/2019   HGB 17.3 05/15/2019   HCT 51.1 (H) 05/15/2019   MCV 81 05/15/2019   PLT  163 05/15/2019   No results found for: IRON, TIBC, FERRITIN  Attestation Statements:   Reviewed by clinician on day of visit: allergies, medications, problem list, medical history, surgical history, family history, social history, and previous encounter notes.  Time spent on visit including pre-visit chart review and post-visit care and documentation was 31 minutes.   I, Burt Knack, am acting as transcriptionist for Quillian Quince, MD.  I have reviewed the above documentation for accuracy and completeness, and I agree  with the above. -  Quillian Quince, MD

## 2019-08-16 ENCOUNTER — Ambulatory Visit (INDEPENDENT_AMBULATORY_CARE_PROVIDER_SITE_OTHER): Payer: Medicare Other | Admitting: Family Medicine

## 2019-08-30 ENCOUNTER — Encounter (INDEPENDENT_AMBULATORY_CARE_PROVIDER_SITE_OTHER): Payer: Self-pay | Admitting: Family Medicine

## 2019-08-30 ENCOUNTER — Other Ambulatory Visit: Payer: Self-pay

## 2019-08-30 ENCOUNTER — Ambulatory Visit (INDEPENDENT_AMBULATORY_CARE_PROVIDER_SITE_OTHER): Payer: 59 | Admitting: Family Medicine

## 2019-08-30 VITALS — BP 163/91 | HR 85 | Temp 98.3°F | Ht 68.0 in | Wt 310.0 lb

## 2019-08-30 DIAGNOSIS — I10 Essential (primary) hypertension: Secondary | ICD-10-CM

## 2019-08-30 DIAGNOSIS — Z6841 Body Mass Index (BMI) 40.0 and over, adult: Secondary | ICD-10-CM

## 2019-08-31 NOTE — Progress Notes (Signed)
Chief Complaint:   OBESITY Brandon [name] is here to discuss his progress with his obesity treatment plan along with follow-up of his obesity related diagnoses. Brandon [name] is on the Category 4 Plan + 500 calories and states he is following his eating plan approximately 60% of the time. Brandon [name] states he is doing 0 minutes 0 times per week.  Today's visit was #: 28 Starting weight: 305 lbs Starting date: 09/15/17 Today's weight: 310 lbs Today's date: 08/30/2019 Total lbs lost to date: 0 Total lbs lost since last in-office visit: 0  Interim History: Brandon [name] continues to struggle with weight gain and he has gained back most of his weight. He did some celebration eating for his birthday, but he states he is ready to get back on track.  Subjective:   1. Essential hypertension Brandon [name]'s blood pressure is elevated today, and it has been creeping up at the same time as his weight increases. There are no change in his medications.  Assessment/Plan:   1. Essential hypertension Brandon [name] will continue with diet and exercise, and will work on healthy weight loss to improve blood pressure control. We will watch for signs of hypotension as he continues his lifestyle modifications. We may need to adjust his medications if his blood pressure doesn't improve soon.  2. Class 3 severe obesity with serious comorbidity and body mass index (BMI) of 45.0 to 49.9 in adult, unspecified obesity type Brandon [name]) Brandon [name] is currently in the action stage of change. As such, his goal is to continue with weight loss efforts. He has agreed to change to following a lower carbohydrate, vegetable and lean protein Brandon [name] diet plan.   Behavioral modification strategies: increasing lean protein intake and decreasing simple carbohydrates.  Brandon [name] has agreed to follow-up with our clinic in 2 to 3 weeks. He was informed of the importance of frequent follow-up visits to maximize his success with intensive lifestyle modifications for his  multiple health conditions.   Objective:   Blood pressure (!) 163/91, pulse 85, temperature 98.3 F (36.8 C), height 5\' 8"  (1.727 m), weight (!) 310 lb (140.6 kg), SpO2 96 %. Body mass index is 47.14 kg/m.  General: Cooperative, alert, well developed, in no acute distress. HEENT: Conjunctivae and lids unremarkable. Cardiovascular: Regular rhythm.  Lungs: Normal work of breathing. Neurologic: No focal deficits.   Lab Results  Component Value Date   CREATININE 0.91 05/15/2019   BUN 21 05/15/2019   NA 142 05/15/2019   K 3.6 05/15/2019   CL 102 05/15/2019   CO2 25 05/15/2019   Lab Results  Component Value Date   ALT 92 (H) 05/15/2019   AST 35 05/15/2019   ALKPHOS 70 05/15/2019   BILITOT 0.8 05/15/2019   Lab Results  Component Value Date   HGBA1C 6.1 (H) 05/15/2019   HGBA1C 5.6 09/26/2018   HGBA1C 4.9 05/19/2018   HGBA1C 5.0 05/13/2018   HGBA1C 5.5 02/16/2018   Lab Results  Component Value Date   INSULIN 94.0 (H) 05/15/2019   INSULIN 76.8 (H) 09/26/2018   INSULIN 60.7 (H) 02/16/2018   INSULIN 110.6 (H) 09/15/2017   Lab Results  Component Value Date   TSH 2.200 09/26/2018   Lab Results  Component Value Date   CHOL 108 05/15/2019   HDL 33 (L) 05/15/2019   LDLCALC 38 05/15/2019   TRIG 238 (H) 05/15/2019   CHOLHDL 3.4 08/24/2017   Lab Results  Component Value Date   WBC 6.0 05/15/2019   HGB 17.3 05/15/2019   HCT 51.1 (  H) 05/15/2019   MCV 81 05/15/2019   PLT 163 05/15/2019   No results found for: IRON, TIBC, FERRITIN  Attestation Statements:   Reviewed by clinician on day of visit: allergies, medications, problem list, medical history, surgical history, family history, social history, and previous encounter notes.  Time spent on visit including pre-visit chart review and post-visit care and charting was 30 minutes.    I, Burt Knack, am acting as transcriptionist for Quillian Quince, MD.  I have reviewed the above documentation for accuracy and  completeness, and I agree with the above. -  Quillian Quince, MD

## 2019-09-13 ENCOUNTER — Encounter (INDEPENDENT_AMBULATORY_CARE_PROVIDER_SITE_OTHER): Payer: Self-pay | Admitting: Family Medicine

## 2019-09-13 ENCOUNTER — Ambulatory Visit (INDEPENDENT_AMBULATORY_CARE_PROVIDER_SITE_OTHER): Payer: 59 | Admitting: Family Medicine

## 2019-09-13 ENCOUNTER — Other Ambulatory Visit: Payer: Self-pay

## 2019-09-13 VITALS — BP 126/84 | HR 88 | Temp 98.2°F | Ht 68.0 in | Wt 300.0 lb

## 2019-09-13 DIAGNOSIS — Z6841 Body Mass Index (BMI) 40.0 and over, adult: Secondary | ICD-10-CM

## 2019-09-13 DIAGNOSIS — E119 Type 2 diabetes mellitus without complications: Secondary | ICD-10-CM | POA: Diagnosis not present

## 2019-09-13 DIAGNOSIS — E559 Vitamin D deficiency, unspecified: Secondary | ICD-10-CM | POA: Diagnosis not present

## 2019-09-13 MED ORDER — VICTOZA 18 MG/3ML ~~LOC~~ SOPN: 1.2000 mg | PEN_INJECTOR | SUBCUTANEOUS | 0 refills | Status: DC

## 2019-09-13 NOTE — Progress Notes (Signed)
Chief Complaint:   OBESITY Brandon [name] is here to discuss his progress with his obesity treatment plan along with follow-up of his obesity related diagnoses. Brandon [name] is on following a lower carbohydrate, vegetable and lean protein rich diet plan and states he is following his eating plan approximately 15% of the time. Brandon [name] states he is doing 0 minutes 0 times per week.  Today's visit was #: 17 Starting weight: 305 lbs Starting date: 09/15/2017 Today's weight: 300 lbs Today's date: 09/13/2019 Total lbs lost to date: 5 Total lbs lost since last in-office visit: 10  Interim History: Brandon [name] tried to change to the low carbohydrate plan, but he found this especially difficult. He did decrease cereal and juice which is very good. He thinks he will do better on his Category 4 plan.  Subjective:   1. Type 2 diabetes mellitus without complication, without long-term current use of insulin (HCC) Brandon [name] is not checking his blood sugars at home, but his last A1c was well controlled at 6.1. He is doing better with taking Victoza regularly and he notes decreased polyphagia.  2. Vitamin D deficiency Brandon [name]'s last lab was at goal. He denies changes in fatigue, and he is due for labs.  Assessment/Plan:   1. Type 2 diabetes mellitus without complication, without long-term current use of insulin (HCC) Good blood sugar control is important to decrease the likelihood of diabetic complications such as nephropathy, neuropathy, limb loss, blindness, coronary artery disease, and death. Intensive lifestyle modification including diet, exercise and weight loss are the first line of treatment for diabetes. We will refill Victoza at 1.2 mg for 1 month.  - liraglutide (VICTOZA) 18 MG/3ML SOPN; Inject 0.2 mLs (1.2 mg total) into the skin every morning.  Dispense: 2 pen; Refill: 0 - Comprehensive metabolic panel - Hemoglobin A1c - Insulin, random - Lipid Panel With LDL/HDL Ratio - VITAMIN D 25 Hydroxy (Vit-D  Deficiency, Fractures)  2. Vitamin D deficiency Low Vitamin D level contributes to fatigue and are associated with obesity, breast, and colon cancer. We will check labs today. Brandon [name] will follow-up for routine testing of Vitamin D, at least 2-3 times per year to avoid over-replacement.  - VITAMIN D 25 Hydroxy (Vit-D Deficiency, Fractures)  3. Class 3 severe obesity with serious comorbidity and body mass index (BMI) of 45.0 to 49.9 in adult, unspecified obesity type Brandon [name]) Brandon [name] is currently in the action stage of change. As such, his goal is to continue with weight loss efforts. He has agreed to the Category 4 Plan.   Behavioral modification strategies: no skipping meals.  Brandon [name] has agreed to follow-up with our clinic in 3 weeks. He was informed of the importance of frequent follow-up visits to maximize his success with intensive lifestyle modifications for his multiple health conditions.   Meet was informed we would discuss his lab results at his next visit unless there is a critical issue that needs to be addressed sooner. Brandon [name] agreed to keep his next visit at the agreed upon time to discuss these results.  Objective:   Blood pressure 126/84, pulse 88, temperature 98.2 F (36.8 C), temperature source Oral, height 5\' 8"  (1.727 m), weight 300 lb (136.1 kg), SpO2 98 %. Body mass index is 45.61 kg/m.  General: Cooperative, alert, well developed, in no acute distress. HEENT: Conjunctivae and lids unremarkable. Cardiovascular: Regular rhythm.  Lungs: Normal work of breathing. Neurologic: No focal deficits.   Lab Results  Component Value Date   CREATININE 0.91 05/15/2019   BUN  21 05/15/2019   NA 142 05/15/2019   K 3.6 05/15/2019   CL 102 05/15/2019   CO2 25 05/15/2019   Lab Results  Component Value Date   ALT 92 (H) 05/15/2019   AST 35 05/15/2019   ALKPHOS 70 05/15/2019   BILITOT 0.8 05/15/2019   Lab Results  Component Value Date   HGBA1C 6.1 (H) 05/15/2019    HGBA1C 5.6 09/26/2018   HGBA1C 4.9 05/19/2018   HGBA1C 5.0 05/13/2018   HGBA1C 5.5 02/16/2018   Lab Results  Component Value Date   INSULIN 94.0 (H) 05/15/2019   INSULIN 76.8 (H) 09/26/2018   INSULIN 60.7 (H) 02/16/2018   INSULIN 110.6 (H) 09/15/2017   Lab Results  Component Value Date   TSH 2.200 09/26/2018   Lab Results  Component Value Date   CHOL 108 05/15/2019   HDL 33 (L) 05/15/2019   LDLCALC 38 05/15/2019   TRIG 238 (H) 05/15/2019   CHOLHDL 3.4 08/24/2017   Lab Results  Component Value Date   WBC 6.0 05/15/2019   HGB 17.3 05/15/2019   HCT 51.1 (H) 05/15/2019   MCV 81 05/15/2019   PLT 163 05/15/2019   No results found for: IRON, TIBC, FERRITIN  Obesity Behavioral Intervention Documentation for Insurance:   Approximately 15 minutes were spent on the discussion below.  ASK: We discussed the diagnosis of obesity with Brandon [name] today and Brandon [name] agreed to give [place] permission to discuss obesity behavioral modification therapy today.  ASSESS: Brandon [name] has the diagnosis of obesity and his BMI today is 45.63. Danen is in the action stage of change.   ADVISE: Brandon [name] was educated on the multiple health risks of obesity as well as the benefit of weight loss to improve his health. He was advised of the need for long term treatment and the importance of lifestyle modifications to improve his current health and to decrease his risk of future health problems.  AGREE: Multiple dietary modification options and treatment options were discussed and Yerachmiel agreed to follow the recommendations documented in the above note.  ARRANGE: Brandon [name] was educated on the importance of frequent visits to treat obesity as outlined per CMS and USPSTF guidelines and agreed to schedule his next follow up appointment today.  Attestation Statements:   Reviewed by clinician on day of visit: allergies, medications, problem list, medical history, surgical history, family history, social history,  and previous encounter notes.   I, Brandon [name], am acting as transcriptionist for Brandon Quince, MD.  I have reviewed the above documentation for accuracy and completeness, and I agree with the above. -  Brandon Quince, MD

## 2019-09-15 LAB — HEMOGLOBIN A1C
Est. average glucose Bld gHb Est-mCnc: 128 mg/dL
Hgb A1c MFr Bld: 6.1 % — ABNORMAL HIGH (ref 4.8–5.6)

## 2019-09-15 LAB — COMPREHENSIVE METABOLIC PANEL
ALT: 76 IU/L — ABNORMAL HIGH (ref 0–44)
AST: 32 IU/L (ref 0–40)
Albumin/Globulin Ratio: 2.2 (ref 1.2–2.2)
Albumin: 4.8 g/dL (ref 4.0–5.0)
Alkaline Phosphatase: 67 IU/L (ref 39–117)
BUN/Creatinine Ratio: 14 (ref 9–20)
BUN: 15 mg/dL (ref 6–24)
Bilirubin Total: 0.9 mg/dL (ref 0.0–1.2)
CO2: 25 mmol/L (ref 20–29)
Calcium: 9.7 mg/dL (ref 8.7–10.2)
Chloride: 102 mmol/L (ref 96–106)
Creatinine, Ser: 1.1 mg/dL (ref 0.76–1.27)
GFR calc Af Amer: 91 mL/min/{1.73_m2} (ref >59)
GFR calc non Af Amer: 79 mL/min/{1.73_m2} (ref >59)
Globulin, Total: 2.2 g/dL (ref 1.5–4.5)
Glucose: 77 mg/dL (ref 65–99)
Potassium: 3.5 mmol/L (ref 3.5–5.2)
Sodium: 144 mmol/L (ref 134–144)
Total Protein: 7 g/dL (ref 6.0–8.5)

## 2019-09-15 LAB — LIPID PANEL WITH LDL/HDL RATIO
Cholesterol, Total: 79 mg/dL — ABNORMAL LOW (ref 100–199)
HDL: 30 mg/dL — ABNORMAL LOW (ref >39)
LDL Chol Calc (NIH): 23 mg/dL (ref 0–99)
LDL/HDL Ratio: 0.8 ratio (ref 0.0–3.6)
Triglycerides: 152 mg/dL — ABNORMAL HIGH (ref 0–149)
VLDL Cholesterol Cal: 26 mg/dL (ref 5–40)

## 2019-09-15 LAB — INSULIN, RANDOM: INSULIN: 48.4 u[IU]/mL — ABNORMAL HIGH (ref 2.6–24.9)

## 2019-09-15 LAB — VITAMIN D 25 HYDROXY (VIT D DEFICIENCY, FRACTURES): Vit D, 25-Hydroxy: 36.2 ng/mL (ref 30.0–100.0)

## 2019-09-27 ENCOUNTER — Telehealth (INDEPENDENT_AMBULATORY_CARE_PROVIDER_SITE_OTHER): Payer: Medicare Other | Admitting: Family Medicine

## 2019-10-04 ENCOUNTER — Other Ambulatory Visit: Payer: Self-pay

## 2019-10-04 ENCOUNTER — Ambulatory Visit (INDEPENDENT_AMBULATORY_CARE_PROVIDER_SITE_OTHER): Payer: 59 | Admitting: Family Medicine

## 2019-10-04 ENCOUNTER — Encounter (INDEPENDENT_AMBULATORY_CARE_PROVIDER_SITE_OTHER): Payer: Self-pay | Admitting: Family Medicine

## 2019-10-04 VITALS — BP 126/80 | HR 84 | Temp 98.1°F | Ht 68.0 in | Wt 300.0 lb

## 2019-10-04 DIAGNOSIS — E1159 Type 2 diabetes mellitus with other circulatory complications: Secondary | ICD-10-CM

## 2019-10-04 DIAGNOSIS — E119 Type 2 diabetes mellitus without complications: Secondary | ICD-10-CM

## 2019-10-04 DIAGNOSIS — E785 Hyperlipidemia, unspecified: Secondary | ICD-10-CM | POA: Diagnosis not present

## 2019-10-04 DIAGNOSIS — E1169 Type 2 diabetes mellitus with other specified complication: Secondary | ICD-10-CM | POA: Diagnosis not present

## 2019-10-04 DIAGNOSIS — I152 Hypertension secondary to endocrine disorders: Secondary | ICD-10-CM

## 2019-10-04 DIAGNOSIS — I10 Essential (primary) hypertension: Secondary | ICD-10-CM

## 2019-10-04 DIAGNOSIS — Z6841 Body Mass Index (BMI) 40.0 and over, adult: Secondary | ICD-10-CM | POA: Diagnosis not present

## 2019-10-05 NOTE — Progress Notes (Signed)
Chief Complaint:   OBESITY Brandon [name] is here to discuss his progress with his obesity treatment plan along with follow-up of his obesity related diagnoses. Brandon [name] is on the Category 4 Plan and states he is following his eating plan approximately 20% of the time. Brandon [name] states he is walking for 20 minutes 1 time per week.  Today's visit was #: 19 Starting weight: 305 lbs Starting date: 09/15/2017 Today's weight: 300 lbs Today's date: 10/04/2019 Total lbs lost to date: 5 Total lbs lost since last in-office visit: 0  Interim History: Brandon [name] feels that he followed his Category 4 meal plan for 3 days out of the last 3 weeks. He feels that Category 4 does not provide enough calories for his metabolic needs. He reports food prep and storage is a challenge. His second COVID-19 vaccination is scheduled for mid April.  Subjective:   1. Type 2 diabetes mellitus without complication, without long-term current use of insulin (HCC) Brandon [name] has not been checking his BGs at home. He has been better about taking liraglutide as directed. He denies episodes of hypoglycemia. Last A1c on 09/13/2019 was 6.1, and insulin level on 09/13/2019 was 48.4. I discussed labs with the patient today.  2. Hyperlipidemia associated with type 2 diabetes mellitus ([place]) Brandon [name]'s lipid panel on 09/13/2019 showed a total cholesterol of 79, triglycerides 152, HDL 30, and LDL 23. His is currently on atorvastatin 20 mg q daily. His CMP on 09/13/2019 showed ALT of 76, improved from 4 months ago but still elevated. He denies right upper quadrant pain, ETOH, or acetaminophen use. I discussed labs with the patient today.  3. Hypertension associated with type 2 diabetes mellitus ([place]) Brandon [name] states he check's his blood pressure when he goes to [facility], and readings there are systolic 267'T and diastolic 24'P. He denies chest pain or dyspnea with exertion. His blood pressure is well controlled today.  Assessment/Plan:   1. Type 2  diabetes mellitus without complication, without long-term current use of insulin (HCC) Good blood sugar control is important to decrease the likelihood of diabetic complications such as nephropathy, neuropathy, limb loss, blindness, coronary artery disease, and death. Intensive lifestyle modification including diet, exercise and weight loss are the first line of treatment for diabetes. Brandon [name] agreed to continue metformin and liraglutide, and will follow up.  2. Hyperlipidemia associated with type 2 diabetes mellitus ([place]) Cardiovascular risk and specific lipid/LDL goals reviewed. We discussed several lifestyle modifications today and Brandon [name] will continue to work on diet, exercise and weight loss efforts. Brandon [name] will continue his Category 4 meal plan. He is to discuss statin therapy with his primary care physician. We will recheck labs in 3-6 months. Orders and follow up as documented in patient record.   3. Hypertension associated with type 2 diabetes mellitus ([place]) Brandon [name] will continue with Category 4 meal plan, and will continue to work on healthy weight loss and exercise to improve blood pressure control. We will watch for signs of hypotension as he continues his lifestyle modifications. Brandon [name] agreed to continue lisinopril-hydrochlorothiazide 20-12.5 mg q daily.  4. Class 3 severe obesity with serious comorbidity and body mass index (BMI) of 45.0 to 49.9 in adult, unspecified obesity type Brandon [name]) Brandon [name] is currently in the action stage of change. As such, his goal is to continue with weight loss efforts. He has agreed to the Category 4 Plan.   Exercise goals: As is.  Behavioral modification strategies: increasing lean protein intake, decreasing simple carbohydrates and meal planning and cooking strategies.  Brandon [name] has agreed to follow-up with our clinic in 3 to 4 weeks. He was informed of the importance of frequent follow-up visits to maximize his success with intensive lifestyle  modifications for his multiple health conditions.   Objective:   Blood pressure 126/80, pulse 84, temperature 98.1 F (36.7 C), temperature source Oral, height 5\' 8"  (1.727 m), weight 300 lb (136.1 kg), SpO2 96 %. Body mass index is 45.61 kg/m.  General: Cooperative, alert, well developed, in no acute distress. HEENT: Conjunctivae and lids unremarkable. Cardiovascular: Regular rhythm.  Lungs: Normal work of breathing. Neurologic: No focal deficits.   Lab Results  Component Value Date   CREATININE 1.10 09/13/2019   BUN 15 09/13/2019   NA 144 09/13/2019   K 3.5 09/13/2019   CL 102 09/13/2019   CO2 25 09/13/2019   Lab Results  Component Value Date   ALT 76 (H) 09/13/2019   AST 32 09/13/2019   ALKPHOS 67 09/13/2019   BILITOT 0.9 09/13/2019   Lab Results  Component Value Date   HGBA1C 6.1 (H) 09/13/2019   HGBA1C 6.1 (H) 05/15/2019   HGBA1C 5.6 09/26/2018   HGBA1C 4.9 05/19/2018   HGBA1C 5.0 05/13/2018   Lab Results  Component Value Date   INSULIN 48.4 (H) 09/13/2019   INSULIN 94.0 (H) 05/15/2019   INSULIN 76.8 (H) 09/26/2018   INSULIN 60.7 (H) 02/16/2018   INSULIN 110.6 (H) 09/15/2017   Lab Results  Component Value Date   TSH 2.200 09/26/2018   Lab Results  Component Value Date   CHOL 79 (L) 09/13/2019   HDL 30 (L) 09/13/2019   LDLCALC 23 09/13/2019   TRIG 152 (H) 09/13/2019   CHOLHDL 3.4 08/24/2017   Lab Results  Component Value Date   WBC 6.0 05/15/2019   HGB 17.3 05/15/2019   HCT 51.1 (H) 05/15/2019   MCV 81 05/15/2019   PLT 163 05/15/2019   No results found for: IRON, TIBC, FERRITIN  Attestation Statements:   Reviewed by clinician on day of visit: allergies, medications, problem list, medical history, surgical history, family history, social history, and previous encounter notes.  Time spent on visit including pre-visit chart review and post-visit care and charting was 37 minutes.    I, 13/03/2019, am acting as transcriptionist for Burt Knack, MD.  I have reviewed the above documentation for accuracy and completeness, and I agree with the above. -  Quillian Quince, MD

## 2019-10-09 ENCOUNTER — Emergency Department (HOSPITAL_COMMUNITY)
Admission: EM | Admit: 2019-10-09 | Discharge: 2019-10-10 | Disposition: A | Discharge status: Home / Self Care | Payer: 59 | Attending: Emergency Medicine | Admitting: Emergency Medicine

## 2019-10-09 ENCOUNTER — Encounter (HOSPITAL_COMMUNITY): Payer: Self-pay

## 2019-10-09 ENCOUNTER — Other Ambulatory Visit: Payer: Self-pay

## 2019-10-09 DIAGNOSIS — S01511A Laceration without foreign body of lip, initial encounter: Secondary | ICD-10-CM | POA: Insufficient documentation

## 2019-10-09 DIAGNOSIS — Y939 Activity, unspecified: Secondary | ICD-10-CM | POA: Diagnosis not present

## 2019-10-09 DIAGNOSIS — S01512A Laceration without foreign body of oral cavity, initial encounter: Secondary | ICD-10-CM

## 2019-10-09 DIAGNOSIS — Z79899 Other long term (current) drug therapy: Secondary | ICD-10-CM | POA: Diagnosis not present

## 2019-10-09 DIAGNOSIS — Z7984 Long term (current) use of oral hypoglycemic drugs: Secondary | ICD-10-CM | POA: Diagnosis not present

## 2019-10-09 DIAGNOSIS — W182XXA Fall in (into) shower or empty bathtub, initial encounter: Secondary | ICD-10-CM | POA: Diagnosis not present

## 2019-10-09 DIAGNOSIS — Y999 Unspecified external cause status: Secondary | ICD-10-CM | POA: Insufficient documentation

## 2019-10-09 DIAGNOSIS — E119 Type 2 diabetes mellitus without complications: Secondary | ICD-10-CM | POA: Diagnosis not present

## 2019-10-09 DIAGNOSIS — S0083XA Contusion of other part of head, initial encounter: Secondary | ICD-10-CM

## 2019-10-09 DIAGNOSIS — I10 Essential (primary) hypertension: Secondary | ICD-10-CM | POA: Diagnosis not present

## 2019-10-09 DIAGNOSIS — Z9049 Acquired absence of other specified parts of digestive tract: Secondary | ICD-10-CM | POA: Insufficient documentation

## 2019-10-09 DIAGNOSIS — Z8673 Personal history of transient ischemic attack (TIA), and cerebral infarction without residual deficits: Secondary | ICD-10-CM | POA: Insufficient documentation

## 2019-10-09 DIAGNOSIS — Y92003 Bedroom of unspecified non-institutional (private) residence as the place of occurrence of the external cause: Secondary | ICD-10-CM | POA: Diagnosis not present

## 2019-10-09 DIAGNOSIS — G35 Multiple sclerosis: Secondary | ICD-10-CM | POA: Diagnosis not present

## 2019-10-09 DIAGNOSIS — Z87891 Personal history of nicotine dependence: Secondary | ICD-10-CM | POA: Diagnosis not present

## 2019-10-09 DIAGNOSIS — S0993XA Unspecified injury of face, initial encounter: Secondary | ICD-10-CM | POA: Diagnosis present

## 2019-10-09 NOTE — ED Triage Notes (Addendum)
Pt reports that he had a mechanical fall around 4p today where he hit his face on the bathtub. States that he chipped a front tooth and laceration noted to upper lip. A&Ox4. No LOC. No blood thinners

## 2019-10-10 MED ORDER — LIDOCAINE-EPINEPHRINE 2 %-1:100000 IJ SOLN
INTRAMUSCULAR | Status: AC
  Filled 2019-10-10: qty 1

## 2019-10-10 MED ORDER — LIDOCAINE-EPINEPHRINE (PF) 2 %-1:200000 IJ SOLN: 10.0000 mL | Freq: Once | INTRAMUSCULAR | Status: DC

## 2019-10-10 NOTE — Discharge Instructions (Addendum)
You were seen in the emergency department for evaluation of facial injuries after a fall.  You had 2 lacerations in the upper inner lip that were sutured.  The stitches will dissolve over time.  You should rinse your mouth out with some warm salt water after eating.  Tylenol for pain.  Return to the emergency department if any worsening or concerning symptoms.

## 2019-10-10 NOTE — ED Provider Notes (Signed)
[facility] DEPT Provider Note   CSN: [id number] Arrival date & time: 10/09/19  2257     History Chief Complaint  Patient presents with  . Fall  . Facial Injury    Brandon [name] is a 49 y.o. male.  Patient is here for evaluation of facial injuries after a fall.  He said he was getting out of the tub when he had a slippery spot and lost his balance and fell striking his face in the tub.  There is no loss of consciousness.  He says his knee is a little sore and his face is sore but his main concern is that he is still bleeding from his upper lip and there is a big gap in the tissue.  He also chipped a tooth and he said he had to pull out a fragment of tooth from this wound.  No headache blurry vision double vision weakness numbness.  Not on anticoagulation.  The history is provided by the patient.  Fall This is a new problem. The current episode started 6 to 12 hours ago. The problem occurs rarely. The problem has been gradually improving. Pertinent negatives include no chest pain, no abdominal pain, no headaches and no shortness of breath. The symptoms are aggravated by drinking. Nothing relieves the symptoms. He has tried nothing for the symptoms. The treatment provided no relief.  Facial Injury Mechanism of injury:  Fall Location:  Mouth Mouth location:  Lip(s) Pain details:    Quality:  Throbbing   Severity:  Mild Foreign body present:  No foreign bodies Relieved by: pressure. Associated symptoms: no altered mental status, no difficulty breathing, no double vision, no headaches, no malocclusion, no neck pain and no vomiting        Past Medical History:  Diagnosis Date  . ADEM (acute disseminated encephalomyelitis)   . Allergy   . Anxiety 2005  . Complication of anesthesia   . Constipation   . Depression 2005  . Diabetes mellitus without complication (HCC)    Type 2  . Difficult intubation    difficult intubating and exbutating "throat  Clinched around tube" during an Endoscopy procedure  . Dyspnea    with exertion  . Headache(784.0)   . Hearing loss   . Hyperinsulinemia 07/04/11   "I'm on metformin cause I produce too much insulin"  . Hyperlipidemia   . Hypertension   . Mononucleosis, infectious, with hepatitis 06/2011  . MS (multiple sclerosis) ([place])    patinet has ADEM not MS per Patient  . Nausea    occasional per history form 08/18/11  . Neuromuscular disorder ([place])   . Pneumonia 2010  . Sleep apnea    uses CPAP at home  . Stroke ([place]) 2000   TIA  . TIA (transient ischemic attack) 2000   denies residual  . TIA (transient ischemic attack)     Patient Active Problem List   Diagnosis Date Noted  . Depression with anxiety 04/13/2019  . Class 3 severe obesity with serious comorbidity and body mass index (BMI) of 40.0 to 44.9 in adult [facility]) 12/28/2018  . Chronic neck pain 02/17/2018  . Other fatigue 09/15/2017  . Shortness of breath on exertion 09/15/2017  . Type 2 diabetes mellitus without complication, without long-term current use of insulin ([place]) 09/15/2017  . Other hyperlipidemia 09/15/2017  . Essential hypertension 09/15/2017  . DDD (degenerative disc disease), cervical 05/03/2017  . Cervical herniated disc 10/21/2016  . Fatty liver 09/22/2016  . Spinal stenosis  in cervical region 08/05/2016  . Diabetes type 2, controlled (HCC) 10/15/2015  . Hypertriglyceridemia 10/15/2015  . Benign essential HTN 12/05/2014  . Macroglossia 09/05/2014  . GAD (generalized anxiety disorder) 12/11/2013  . Motor tic disorder 12/11/2013  . OSA on CPAP 10/04/2013  . Hypersomnia with sleep apnea, unspecified 10/04/2013  . High frequency hearing loss 08/24/2013  . Peripheral neuropathy 04/19/2013  . Abnormal gait 10/19/2012  . Cervical myelopathy (HCC) 10/19/2012  . Esophageal reflux 07/27/2011  . Morbid obesity due to excess calories (HCC) 07/22/2011  . ADD (attention deficit disorder) 07/22/2011  . ADEM (acute  disseminated encephalomyelitis) 07/03/2011  . Splenomegaly 07/03/2011  . Elevated LFTs 07/03/2011    Past Surgical History:  Procedure Laterality Date  . ANTERIOR CERVICAL DECOMP/DISCECTOMY FUSION N/A 10/21/2016   Procedure: ANTERIOR CERVICAL DECOMPRESSION/DISCECTOMY FUSION, INTERBODY PROSTHESIS,PLATE CERVICAL FOUR- CERVICAL FIVE, CERVICAL FIVE- CERVICAL SIX;  Surgeon: Tressie Stalker, MD;  Location: [facility] OR;  Service: Neurosurgery;  Laterality: N/A;  . ANTERIOR CERVICAL DECOMP/DISCECTOMY FUSION N/A 05/19/2018   Procedure: ANTERIOR CERVICAL DECOMPRESSION/DISCECTOMY FUSION, INTERBODY PROSTHESIS,PLATE/SCREWS CERVICAL SIX- CERVICAL SEVEN; EXPLORE FUSION;  Surgeon: Tressie Stalker, MD;  Location: [facility] OR;  Service: Neurosurgery;  Laterality: N/A;  . CHOLECYSTECTOMY  08/02/2011   Procedure: LAPAROSCOPIC CHOLECYSTECTOMY;  Surgeon: Shelly Rubenstein, MD;  Location: [facility] OR;  Service: General;;  . COLONOSCOPY  2003  . ESOPHAGOGASTRODUODENOSCOPY  2003  . ESOPHAGOGASTRODUODENOSCOPY  07/28/2011   Procedure: ESOPHAGOGASTRODUODENOSCOPY (EGD);  Surgeon: [name], MD;  Location[facility] ENDOSCOPY;  Service: Endoscopy;  Laterality: N/A;  . MYRINGOTOMY  1979; 1980   bilaterally  . TONSILLECTOMY AND ADENOIDECTOMY  ~ 1980       Family History  Problem Relation Age of Onset  . Coronary artery disease Father   . Stroke Father   . Cancer Father   . High blood pressure Father   . Alcoholism Father   . Diabetes Mother   . Obesity Mother   . Cancer Maternal Grandmother        bone  . Cancer Other        Stomach -Great-great grandmother    Social History   Tobacco Use  . Smoking status: Former Smoker    Packs/day: 3.00    Years: 3.00    Pack years: 9.00    Types: Cigarettes    Quit date: 08/18/1987    Years since quitting: 32.1  . Smokeless tobacco: Never Used  Substance Use Topics  . Alcohol use: Not Currently    Alcohol/week: 0.0 standard drinks    Comment: 07/04/11 "glass of wine or spirits once a  month"  . Drug use: Not Currently    Types: Marijuana    Comment: have not used in a 1.5 years    Home Medications Prior to Admission medications   Medication Sig Start Date End Date Taking? Authorizing Provider  atorvastatin (LIPITOR) 20 MG tablet TAKE 1 TABLET (20 MG TOTAL) BY MOUTH AT BEDTIME. Patient taking differently: Take 20 mg by mouth at bedtime.  03/10/18   Valarie Cones, Dema Severin, PA-C  colesevelam [facility]) 625 MG tablet Take 3 tablets (1,875 mg total) by mouth daily with breakfast. 02/28/18   Valarie Cones, Dema Severin, PA-C  cyclobenzaprine (AMRIX) 15 MG 24 hr capsule Take 15 mg by mouth 2 (two) times daily.    [provider]  gabapentin (NEURONTIN) 300 MG capsule Take 3 capsules (900 mg total) by mouth 2 (two) times daily. 08/24/17   Valarie Cones, Dema Severin, PA-C  Insulin Pen Needle (BD PEN NEEDLE NANO  U/F) 32G X 4 MM MISC 1 Package by Does not apply route 2 (two) times daily. 06/15/18   Quillian Quince D, MD  liraglutide (VICTOZA) 18 MG/3ML SOPN Inject 0.2 mLs (1.2 mg total) into the skin every morning. 09/13/19   Quillian Quince D, MD  lisinopril-hydrochlorothiazide (ZESTORETIC) 20-12.5 MG tablet Take 1 tablet by mouth daily. 06/27/19   Quillian Quince D, MD  metFORMIN (GLUCOPHAGE-XR) 500 MG 24 hr tablet TAKE 2 TABLETS (1,000 MG TOTAL) BY MOUTH 2 (TWO) TIMES DAILY. Patient taking differently: Take 1,000 mg by mouth 2 (two) times daily. TAKE 2 TABLETS (1,000 MG TOTAL) BY MOUTH 2 (TWO) TIMES DAILY. 03/25/18   Georgina Quint, MD  NON FORMULARY CPAP machine at bedtime & sleep    [provider]  Vitamin D, Ergocalciferol, (DRISDOL) 1.25 MG (50000 UT) CAPS capsule Take 1 capsule (50,000 Units total) by mouth every 7 (seven) days. 05/15/19   Quillian Quince D, MD    Allergies    Amoxicillin, Penicillin g, Sulfa antibiotics, Cephalosporins, Quinolones, and Sulfamethoxazole  Review of Systems   Review of Systems  Constitutional: Negative for fever.  HENT: Negative for sore throat.   Eyes:  Negative for double vision and visual disturbance.  Respiratory: Negative for shortness of breath.   Cardiovascular: Negative for chest pain.  Gastrointestinal: Negative for abdominal pain and vomiting.  Genitourinary: Negative for dysuria.  Musculoskeletal: Negative for neck pain.  Skin: Negative for rash.  Neurological: Negative for headaches.    Physical Exam Updated Vital Signs BP (!) 154/91 (BP Location: Left Arm)   Pulse 70   Temp 98.6 F (37 C) (Oral)   Resp 19   Ht 5\' 8"  (1.727 m)   Wt 136.1 kg   SpO2 94%   BMI 45.61 kg/m   Physical Exam Vitals and nursing note reviewed.  Constitutional:      Appearance: He is well-developed.  HENT:     Head: Normocephalic and atraumatic.     Mouth/Throat:     Mouth: Mucous membranes are moist.     Comments: He is approximately 2 cm laceration on the inner upper lip.  No appreciable foreign body.  He also has a chipped incisor.  Lower lip has some bruising on the inner area but no laceration.  No malocclusion. Eyes:     Conjunctiva/sclera: Conjunctivae normal.  Pulmonary:     Effort: Pulmonary effort is normal.  Musculoskeletal:     Cervical back: Neck supple.  Skin:    General: Skin is warm and dry.  Neurological:     General: No focal deficit present.     Mental Status: He is alert.     GCS: GCS eye subscore is 4. GCS verbal subscore is 5. GCS motor subscore is 6.     Gait: Gait normal.     ED Results / Procedures / Treatments   Labs (all labs ordered are listed, but only abnormal results are displayed) Labs Reviewed - No data to display  EKG None  Radiology No results found.  Procedures . Laceration Repair  Date/Time: 10/10/2019 12:27 AM Performed by: 12/10/2019, MD Authorized by: Terrilee Files, MD   Consent:    Consent obtained:  Verbal   Consent given by:  Patient   Risks discussed:  Infection, pain, poor cosmetic result, poor wound healing and retained foreign body   Alternatives discussed:   No treatment and delayed treatment Anesthesia (see MAR for exact dosages):    Anesthesia method:  Local infiltration  Local anesthetic:  Lidocaine 2% WITH epi Laceration details:    Location:  Lip   Lip location:  Upper interior lip   Length (cm):  3 Repair type:    Repair type:  Simple Skin repair:    Repair method:  Sutures   Suture size:  4-0   Wound skin closure material used: vicryl.   Suture technique:  Simple interrupted   Number of sutures:  3 Approximation:    Approximation:  Close   V[place] border well-aligned: N/A.   Post-procedure details:    Dressing:  Open (no dressing)   Patient tolerance of procedure:  Tolerated well, no immediate complications   (including critical care time)  Medications Ordered in ED Medications  lidocaine-EPINEPHrine (XYLOCAINE W/EPI) 2 %-1:200000 (PF) injection 10 mL (has no administration in time range)    ED Course  I have reviewed the triage vital signs and the nursing notes.  Pertinent labs & imaging results that were available during my care of the patient were reviewed by me and considered in my medical decision making (see chart for details).    MDM Rules/Calculators/A&P                       Final Clinical Impression(s) / ED Diagnoses Final diagnoses:  Contusion of face, initial encounter  Laceration of oral cavity, initial encounter    Rx / DC Orders ED Discharge Orders    None       Terrilee Files, MD 10/10/19 1114

## 2019-10-12 DIAGNOSIS — M25562 Pain in left knee: Secondary | ICD-10-CM | POA: Diagnosis not present

## 2019-10-12 DIAGNOSIS — T781XXA Other adverse food reactions, not elsewhere classified, initial encounter: Secondary | ICD-10-CM | POA: Diagnosis not present

## 2019-10-12 DIAGNOSIS — E1169 Type 2 diabetes mellitus with other specified complication: Secondary | ICD-10-CM | POA: Diagnosis not present

## 2019-10-12 DIAGNOSIS — W19XXXD Unspecified fall, subsequent encounter: Secondary | ICD-10-CM | POA: Diagnosis not present

## 2019-10-12 DIAGNOSIS — E785 Hyperlipidemia, unspecified: Secondary | ICD-10-CM | POA: Diagnosis not present

## 2019-10-12 DIAGNOSIS — Z0000 Encounter for general adult medical examination without abnormal findings: Secondary | ICD-10-CM | POA: Diagnosis not present

## 2019-10-12 DIAGNOSIS — Z6841 Body Mass Index (BMI) 40.0 and over, adult: Secondary | ICD-10-CM | POA: Diagnosis not present

## 2019-10-12 DIAGNOSIS — G8929 Other chronic pain: Secondary | ICD-10-CM | POA: Diagnosis not present

## 2019-10-31 ENCOUNTER — Other Ambulatory Visit: Payer: Self-pay

## 2019-10-31 ENCOUNTER — Ambulatory Visit (INDEPENDENT_AMBULATORY_CARE_PROVIDER_SITE_OTHER): Payer: 59 | Admitting: Family Medicine

## 2019-10-31 ENCOUNTER — Encounter (INDEPENDENT_AMBULATORY_CARE_PROVIDER_SITE_OTHER): Payer: Self-pay | Admitting: Family Medicine

## 2019-10-31 VITALS — BP 113/74 | HR 102 | Temp 98.2°F | Ht 68.0 in | Wt 298.0 lb

## 2019-10-31 DIAGNOSIS — Z6841 Body Mass Index (BMI) 40.0 and over, adult: Secondary | ICD-10-CM | POA: Diagnosis not present

## 2019-10-31 DIAGNOSIS — I10 Essential (primary) hypertension: Secondary | ICD-10-CM | POA: Diagnosis not present

## 2019-10-31 MED ORDER — LISINOPRIL-HYDROCHLOROTHIAZIDE 20-12.5 MG PO TABS: 1.0000 | ORAL_TABLET | Freq: Every day | ORAL | 0 refills | Status: DC

## 2019-11-01 NOTE — Progress Notes (Signed)
Chief Complaint:   OBESITY Brandon [name] is here to discuss his progress with his obesity treatment plan along with follow-up of his obesity related diagnoses. Brandon [name] is on the Category 4 Plan and states he is following his eating plan approximately 33% of the time. Brandon [name] states he is on the treadmill and doing circuit training for 60 minutes 1 time per week.  Today's visit was #: 18 Starting weight: 305 lbs Starting date: 09/15/2017 Today's weight: 298 lbs Today's date: 10/31/2019 Total lbs lost to date: 7 Total lbs lost since last in-office visit: 2  Interim History: Brandon [name] has done well with weight loss and changed to journaling at times. He is doing well meeting his protein goal but he is still struggling to decrease simple carbohydrates.  Subjective:   1. Essential hypertension Brandon [name]'s blood pressure is well controlled on his medications.  Assessment/Plan:   1. Essential hypertension Brandon [name] is working on healthy weight loss and exercise to improve blood pressure control. We will watch for signs of hypotension as he continues his lifestyle modifications. We will refill Zestoretic for 1 month.  - lisinopril-hydrochlorothiazide (ZESTORETIC) 20-12.5 MG tablet; Take 1 tablet by mouth daily.  Dispense: 30 tablet; Refill: 0  2. Class 3 severe obesity with serious comorbidity and body mass index (BMI) of 45.0 to 49.9 in adult, unspecified obesity type Brandon [name]) Brandon [name] is currently in the action stage of change. As such, his goal is to continue with weight loss efforts. He has agreed to the Category 4 Plan or keeping a food journal and adhering to recommended goals of 1900 calories and 120 grams of protein daily.   Exercise goals: As is.  Behavioral modification strategies: increasing lean protein intake, decreasing simple carbohydrates and meal planning and cooking strategies.  [name] has agreed to follow-up with our clinic in 3 weeks. He was informed of the importance of frequent  follow-up visits to maximize his success with intensive lifestyle modifications for his multiple health conditions.   Objective:   Blood pressure 113/74, pulse (!) 102, temperature 98.2 F (36.8 C), temperature source Oral, height 5\' 8"  (1.727 m), weight 298 lb (135.2 kg), SpO2 96 %. Body mass index is 45.31 kg/m.  General: Cooperative, alert, well developed, in no acute distress. HEENT: Conjunctivae and lids unremarkable. Cardiovascular: Regular rhythm.  Lungs: Normal work of breathing. Neurologic: No focal deficits.   Lab Results  Component Value Date   CREATININE 1.10 09/13/2019   BUN 15 09/13/2019   NA 144 09/13/2019   K 3.5 09/13/2019   CL 102 09/13/2019   CO2 25 09/13/2019   Lab Results  Component Value Date   ALT 76 (H) 09/13/2019   AST 32 09/13/2019   ALKPHOS 67 09/13/2019   BILITOT 0.9 09/13/2019   Lab Results  Component Value Date   HGBA1C 6.1 (H) 09/13/2019   HGBA1C 6.1 (H) 05/15/2019   HGBA1C 5.6 09/26/2018   HGBA1C 4.9 05/19/2018   HGBA1C 5.0 05/13/2018   Lab Results  Component Value Date   INSULIN 48.4 (H) 09/13/2019   INSULIN 94.0 (H) 05/15/2019   INSULIN 76.8 (H) 09/26/2018   INSULIN 60.7 (H) 02/16/2018   INSULIN 110.6 (H) 09/15/2017   Lab Results  Component Value Date   TSH 2.200 09/26/2018   Lab Results  Component Value Date   CHOL 79 (L) 09/13/2019   HDL 30 (L) 09/13/2019   LDLCALC 23 09/13/2019   TRIG 152 (H) 09/13/2019   CHOLHDL 3.4 08/24/2017   Lab Results  Component  Value Date   WBC 6.0 05/15/2019   HGB 17.3 05/15/2019   HCT 51.1 (H) 05/15/2019   MCV 81 05/15/2019   PLT 163 05/15/2019   No results found for: IRON, TIBC, FERRITIN  Attestation Statements:   Reviewed by clinician on day of visit: allergies, medications, problem list, medical history, surgical history, family history, social history, and previous encounter notes.  Time spent on visit including pre-visit chart review and post-visit care and charting was 30  minutes.    I, Burt Knack, am acting as transcriptionist for Quillian Quince, MD.  I have reviewed the above documentation for accuracy and completeness, and I agree with the above. -  Quillian Quince, MD

## 2019-11-22 ENCOUNTER — Ambulatory Visit (INDEPENDENT_AMBULATORY_CARE_PROVIDER_SITE_OTHER): Payer: Medicare Other | Admitting: Family Medicine

## 2019-12-06 ENCOUNTER — Ambulatory Visit (INDEPENDENT_AMBULATORY_CARE_PROVIDER_SITE_OTHER): Payer: Medicare Other | Admitting: Family Medicine

## 2019-12-06 ENCOUNTER — Encounter (INDEPENDENT_AMBULATORY_CARE_PROVIDER_SITE_OTHER): Payer: Self-pay | Admitting: Family Medicine

## 2019-12-06 ENCOUNTER — Other Ambulatory Visit: Payer: Self-pay

## 2019-12-06 VITALS — BP 129/80 | HR 79 | Temp 98.3°F | Ht 68.0 in | Wt 300.0 lb

## 2019-12-06 DIAGNOSIS — E559 Vitamin D deficiency, unspecified: Secondary | ICD-10-CM | POA: Diagnosis not present

## 2019-12-06 DIAGNOSIS — E1169 Type 2 diabetes mellitus with other specified complication: Secondary | ICD-10-CM

## 2019-12-06 DIAGNOSIS — I10 Essential (primary) hypertension: Secondary | ICD-10-CM

## 2019-12-06 DIAGNOSIS — E1159 Type 2 diabetes mellitus with other circulatory complications: Secondary | ICD-10-CM | POA: Diagnosis not present

## 2019-12-06 DIAGNOSIS — I152 Hypertension secondary to endocrine disorders: Secondary | ICD-10-CM

## 2019-12-06 DIAGNOSIS — Z6841 Body Mass Index (BMI) 40.0 and over, adult: Secondary | ICD-10-CM | POA: Diagnosis not present

## 2019-12-06 MED ORDER — VITAMIN D (ERGOCALCIFEROL) 1.25 MG (50000 UNIT) PO CAPS: 50000.0000 [IU] | ORAL_CAPSULE | ORAL | 0 refills | Status: DC

## 2019-12-07 DIAGNOSIS — E785 Hyperlipidemia, unspecified: Secondary | ICD-10-CM | POA: Diagnosis not present

## 2019-12-07 DIAGNOSIS — M25561 Pain in right knee: Secondary | ICD-10-CM | POA: Diagnosis not present

## 2019-12-07 DIAGNOSIS — I10 Essential (primary) hypertension: Secondary | ICD-10-CM | POA: Diagnosis not present

## 2019-12-07 DIAGNOSIS — G0400 Acute disseminated encephalitis and encephalomyelitis, unspecified: Secondary | ICD-10-CM | POA: Diagnosis not present

## 2019-12-07 DIAGNOSIS — I152 Hypertension secondary to endocrine disorders: Secondary | ICD-10-CM | POA: Diagnosis not present

## 2019-12-07 DIAGNOSIS — M25562 Pain in left knee: Secondary | ICD-10-CM | POA: Diagnosis not present

## 2019-12-07 DIAGNOSIS — G8929 Other chronic pain: Secondary | ICD-10-CM | POA: Diagnosis not present

## 2019-12-07 DIAGNOSIS — M170 Bilateral primary osteoarthritis of knee: Secondary | ICD-10-CM | POA: Diagnosis not present

## 2019-12-07 DIAGNOSIS — E1169 Type 2 diabetes mellitus with other specified complication: Secondary | ICD-10-CM | POA: Diagnosis not present

## 2019-12-07 DIAGNOSIS — M5030 Other cervical disc degeneration, unspecified cervical region: Secondary | ICD-10-CM | POA: Diagnosis not present

## 2019-12-07 DIAGNOSIS — E1159 Type 2 diabetes mellitus with other circulatory complications: Secondary | ICD-10-CM | POA: Diagnosis not present

## 2019-12-07 NOTE — Progress Notes (Signed)
Chief Complaint:   OBESITY Brandon [name] is here to discuss his progress with his obesity treatment plan along with follow-up of his obesity related diagnoses. Brandon [name] is on the Category 4 Plan or keeping a food journal and adhering to recommended goals of 1900 calories and 120 grams of protein and states he is following his eating plan approximately 20% of the time. Brandon [name] states he is walking for 30 minutes 1 time per week.  Today's visit was #: 37 Starting weight: 305 lbs Starting date: 09/15/2017 Today's weight: 300 lbs Today's date: 12/06/2019 Total lbs lost to date: 5 Total lbs lost since last in-office visit: 0  Interim History: Brandon [name] deviated from his plan by consuming "lime donuts" for several days. He has been journaling to stay on the Category 4 plan. He states it helps him stay on the plan. He will take his first trip in July and he is really looking forward to it.  Subjective:   1. Vitamin D deficiency Brandon [name]'s Vit D level on 09/13/2019 was 36.2. He is on prescription strength Vit D supplementation, and is tolerating it well.  2. Type 2 diabetes mellitus with other specified complication, without long-term current use of insulin (HCC) Brandon [name] is not checking his BGs at home, and he denies episodes of hypoglycemia. He is on metformin 500 mg 2 tablets BID and liraglutide 1.2 mg q daily, and is tolerating them well.  3. Hypertension associated with type 2 diabetes mellitus ([place]) Brandon [name]'s blood pressure and heart rate are well controlled at his office visit today. He denies chest pain or dyspnea. He is on lisinopril-hydrochlorothiazide 20-12.5 mg q daily.  Assessment/Plan:   1. Vitamin D deficiency Low Vitamin D level contributes to fatigue and are associated with obesity, breast, and colon cancer. We will refill prescription Vitamin D for 1 month. Brandon [name] will follow-up for routine testing of Vitamin D, at least 2-3 times per year to avoid over-replacement. We will recheck  labs at his next office visit.  - Vitamin D, Ergocalciferol, (DRISDOL) 1.25 MG (50000 UNIT) CAPS capsule; Take 1 capsule (50,000 Units total) by mouth every 7 (seven) days.  Dispense: 4 capsule; Refill: 0  2. Type 2 diabetes mellitus with other specified complication, without long-term current use of insulin (HCC) Good blood sugar control is important to decrease the likelihood of diabetic complications such as nephropathy, neuropathy, limb loss, blindness, coronary artery disease, and death. Intensive lifestyle modification including diet, exercise and weight loss are the first line of treatment for diabetes. Brandon [name] will continue his Category 4 meal plan, and we will recheck labs at his next office visit.  3. Hypertension associated with type 2 diabetes mellitus (Brandon [name]) Brandon [name] is working on healthy weight loss and exercise to improve blood pressure control. We will watch for signs of hypotension as he continues his lifestyle modifications. Brandon [name] will continue his current anti-hypertensives, and we will recheck labs at his next office visit.  4. Class 3 severe obesity with serious comorbidity and body mass index (BMI) of 45.0 to 49.9 in adult, unspecified obesity type Brandon [name]) Brandon [name] is currently in the action stage of change. As such, his goal is to continue with weight loss efforts. He has agreed to the Category 4 Plan.   Exercise goals: As is.  Behavioral modification strategies: increasing lean protein intake, decreasing simple carbohydrates, no skipping meals, meal planning and cooking strategies and travel eating strategies.  Brandon [name] has agreed to follow-up with our clinic in 4 weeks. He was informed of the  importance of frequent follow-up visits to maximize his success with intensive lifestyle modifications for his multiple health conditions.   Objective:   Blood pressure 129/80, pulse 79, temperature 98.3 F (36.8 C), temperature source Oral, height 5\' 8"  (1.727 m), weight 300 lb (136.1  kg), SpO2 96 %. Body mass index is 45.61 kg/m.  General: Cooperative, alert, well developed, in no acute distress. HEENT: Conjunctivae and lids unremarkable. Cardiovascular: Regular rhythm.  Lungs: Normal work of breathing. Neurologic: No focal deficits.   Lab Results  Component Value Date   CREATININE 1.10 09/13/2019   BUN 15 09/13/2019   NA 144 09/13/2019   K 3.5 09/13/2019   CL 102 09/13/2019   CO2 25 09/13/2019   Lab Results  Component Value Date   ALT 76 (H) 09/13/2019   AST 32 09/13/2019   ALKPHOS 67 09/13/2019   BILITOT 0.9 09/13/2019   Lab Results  Component Value Date   HGBA1C 6.1 (H) 09/13/2019   HGBA1C 6.1 (H) 05/15/2019   HGBA1C 5.6 09/26/2018   HGBA1C 4.9 05/19/2018   HGBA1C 5.0 05/13/2018   Lab Results  Component Value Date   INSULIN 48.4 (H) 09/13/2019   INSULIN 94.0 (H) 05/15/2019   INSULIN 76.8 (H) 09/26/2018   INSULIN 60.7 (H) 02/16/2018   INSULIN 110.6 (H) 09/15/2017   Lab Results  Component Value Date   TSH 2.200 09/26/2018   Lab Results  Component Value Date   CHOL 79 (L) 09/13/2019   HDL 30 (L) 09/13/2019   LDLCALC 23 09/13/2019   TRIG 152 (H) 09/13/2019   CHOLHDL 3.4 08/24/2017   Lab Results  Component Value Date   WBC 6.0 05/15/2019   HGB 17.3 05/15/2019   HCT 51.1 (H) 05/15/2019   MCV 81 05/15/2019   PLT 163 05/15/2019   No results found for: IRON, TIBC, FERRITIN  Obesity Behavioral Intervention Documentation for Insurance:   Approximately 15 minutes were spent on the discussion below.  ASK: We discussed the diagnosis of obesity with 13/03/2019 today and Brandon [name] agreed to give Brandon [name] permission to discuss obesity behavioral modification therapy today.  ASSESS: Brandon [name] has the diagnosis of obesity and his BMI today is 45.63. Brandon [name] is in the action stage of change.   ADVISE: Brandon [name] was educated on the multiple health risks of obesity as well as the benefit of weight loss to improve his health. He was advised of the need for  long term treatment and the importance of lifestyle modifications to improve his current health and to decrease his risk of future health problems.  AGREE: Multiple dietary modification options and treatment options were discussed and Brandon [name] agreed to follow the recommendations documented in the above note.  ARRANGE: Shahab was educated on the importance of frequent visits to treat obesity as outlined per CMS and USPSTF guidelines and agreed to schedule his next follow up appointment today.  Attestation Statements:   Reviewed by clinician on day of visit: allergies, medications, problem list, medical history, surgical history, family history, social history, and previous encounter notes.   I, Brandon [name], am acting as transcriptionist for Burt Knack, MD.  I have reviewed the above documentation for accuracy and completeness, and I agree with the above. -  Quillian Quince, MD

## 2020-01-03 ENCOUNTER — Ambulatory Visit (INDEPENDENT_AMBULATORY_CARE_PROVIDER_SITE_OTHER): Payer: Medicare Other | Admitting: Family Medicine

## 2020-01-03 ENCOUNTER — Other Ambulatory Visit: Payer: Self-pay

## 2020-01-03 ENCOUNTER — Encounter (INDEPENDENT_AMBULATORY_CARE_PROVIDER_SITE_OTHER): Payer: Self-pay | Admitting: Family Medicine

## 2020-01-03 VITALS — BP 122/85 | HR 85 | Temp 98.3°F | Ht 68.0 in | Wt 296.0 lb

## 2020-01-03 DIAGNOSIS — E785 Hyperlipidemia, unspecified: Secondary | ICD-10-CM | POA: Diagnosis not present

## 2020-01-03 DIAGNOSIS — E1159 Type 2 diabetes mellitus with other circulatory complications: Secondary | ICD-10-CM | POA: Diagnosis not present

## 2020-01-03 DIAGNOSIS — E1169 Type 2 diabetes mellitus with other specified complication: Secondary | ICD-10-CM

## 2020-01-03 DIAGNOSIS — Z6841 Body Mass Index (BMI) 40.0 and over, adult: Secondary | ICD-10-CM

## 2020-01-03 DIAGNOSIS — I10 Essential (primary) hypertension: Secondary | ICD-10-CM

## 2020-01-03 DIAGNOSIS — E559 Vitamin D deficiency, unspecified: Secondary | ICD-10-CM

## 2020-01-03 DIAGNOSIS — I152 Hypertension secondary to endocrine disorders: Secondary | ICD-10-CM

## 2020-01-03 MED ORDER — VITAMIN D (ERGOCALCIFEROL) 1.25 MG (50000 UNIT) PO CAPS: 50000.0000 [IU] | ORAL_CAPSULE | ORAL | 0 refills | Status: DC

## 2020-01-04 LAB — COMPREHENSIVE METABOLIC PANEL
ALT: 86 IU/L — ABNORMAL HIGH (ref 0–44)
AST: 27 IU/L (ref 0–40)
Albumin/Globulin Ratio: 1.9 (ref 1.2–2.2)
Albumin: 4.6 g/dL (ref 4.0–5.0)
Alkaline Phosphatase: 71 IU/L (ref 48–121)
BUN/Creatinine Ratio: 17 (ref 9–20)
BUN: 16 mg/dL (ref 6–24)
Bilirubin Total: 0.5 mg/dL (ref 0.0–1.2)
CO2: 28 mmol/L (ref 20–29)
Calcium: 9.7 mg/dL (ref 8.7–10.2)
Chloride: 102 mmol/L (ref 96–106)
Creatinine, Ser: 0.96 mg/dL (ref 0.76–1.27)
GFR calc Af Amer: 108 mL/min/{1.73_m2} (ref >59)
GFR calc non Af Amer: 93 mL/min/{1.73_m2} (ref >59)
Globulin, Total: 2.4 g/dL (ref 1.5–4.5)
Glucose: 91 mg/dL (ref 65–99)
Potassium: 3.5 mmol/L (ref 3.5–5.2)
Sodium: 145 mmol/L — ABNORMAL HIGH (ref 134–144)
Total Protein: 7 g/dL (ref 6.0–8.5)

## 2020-01-04 LAB — LIPID PANEL WITH LDL/HDL RATIO
Cholesterol, Total: 108 mg/dL (ref 100–199)
HDL: 30 mg/dL — ABNORMAL LOW (ref >39)
LDL Chol Calc (NIH): 32 mg/dL (ref 0–99)
LDL/HDL Ratio: 1.1 ratio (ref 0.0–3.6)
Triglycerides: 311 mg/dL — ABNORMAL HIGH (ref 0–149)
VLDL Cholesterol Cal: 46 mg/dL — ABNORMAL HIGH (ref 5–40)

## 2020-01-04 LAB — HEMOGLOBIN A1C
Est. average glucose Bld gHb Est-mCnc: 120 mg/dL
Hgb A1c MFr Bld: 5.8 % — ABNORMAL HIGH (ref 4.8–5.6)

## 2020-01-04 LAB — VITAMIN D 25 HYDROXY (VIT D DEFICIENCY, FRACTURES): Vit D, 25-Hydroxy: 25 ng/mL — ABNORMAL LOW (ref 30.0–100.0)

## 2020-01-04 LAB — INSULIN, RANDOM: INSULIN: 83.4 u[IU]/mL — ABNORMAL HIGH (ref 2.6–24.9)

## 2020-01-17 NOTE — Progress Notes (Signed)
Chief Complaint:   OBESITY Brandon [name] is here to discuss his progress with his obesity treatment plan along with follow-up of his obesity related diagnoses. Brandon [name] is on the Category 4 Plan and states he is following his eating plan approximately 20% of the time. Brandon [name] states he is doing 0 minutes 0 times per week.  Today's visit was #: 38 Starting weight: 305 lbs Starting date: 09/15/2017 Today's weight: 296 lbs Today's date: 01/03/2020 Total lbs lost to date: 9 Total lbs lost since last in-office visit: 4  Interim History: Brandon [name] continues to do well with weight loss primarily due to inability to eat after having multiple teeth extrated. His RMR may have decreased due to inadequate intake.  Subjective:   1. Vitamin D deficiency Brandon [name] is stable on Vit D, and he denies nausea, vomiting, or muscle weakness. He requests a refill today.  2. Type 2 diabetes mellitus with other specified complication, without long-term current use of insulin (HCC) Brandon [name] is working on diet and he denies hypoglycemia even with not eating all of his food.  3. Hyperlipidemia associated with type 2 diabetes mellitus (HCC) Brandon [name] is working on diet and weight loss. He is due for labs. He denies chest pain.  4. Hypertension associated with diabetes (HCC) Brandon [name]'s blood pressure is well controlled on diet, and he denies chest pain or headaches. He is due for labs.  Assessment/Plan:   1. Vitamin D deficiency Low Vitamin D level contributes to fatigue and are associated with obesity, breast, and colon cancer. We will check labs today, and we will refill prescription Vitamin D for 1 month. Brandon [name] will follow-up for routine testing of Vitamin D, at least 2-3 times per year to avoid over-replacement.  - Vitamin D, Ergocalciferol, (DRISDOL) 1.25 MG (50000 UNIT) CAPS capsule; Take 1 capsule (50,000 Units total) by mouth every 7 (seven) days.  Dispense: 4 capsule; Refill: 0  - VITAMIN D 25 Hydroxy (Vit-D  Deficiency, Fractures)  2. Type 2 diabetes mellitus with other specified complication, without long-term current use of insulin (HCC) Good blood sugar control is important to decrease the likelihood of diabetic complications such as nephropathy, neuropathy, limb loss, blindness, coronary artery disease, and death. Intensive lifestyle modification including diet, exercise and weight loss are the first line of treatment for diabetes. We will check labs today, and Brandon [name] will follow as directed.  - Comprehensive metabolic panel - Hemoglobin A1c - Insulin, random  3. Hyperlipidemia associated with type 2 diabetes mellitus (HCC) Cardiovascular risk and specific lipid/LDL goals reviewed. We discussed several lifestyle modifications today. Brandon [name] will continue to work on diet, exercise and weight loss efforts. We will check labs today. Orders and follow up as documented in patient record.   - Lipid Panel With LDL/HDL Ratio  4. Hypertension associated with diabetes (HCC) Brandon [name] is working on healthy weight loss and exercise to improve blood pressure control. We will watch for signs of hypotension as he continues his lifestyle modifications. We will check labs today.  5. Class 3 severe obesity with serious comorbidity and body mass index (BMI) of 45.0 to 49.9 in adult, unspecified obesity type Brandon [name] is currently in the action stage of change. As such, his goal is to continue with weight loss efforts. He has agreed to the Category 4 Plan.   Behavioral modification strategies: no skipping meals and better snacking choices.  [place] has agreed to follow-up with our clinic in 4 weeks. He was informed of the importance of frequent follow-up visits to  maximize his success with intensive lifestyle modifications for his multiple health conditions.   Brandon [name] was informed we would discuss his lab results at his next visit unless there is a critical issue that needs to be addressed sooner. Brandon [name]  agreed to keep his next visit at the agreed upon time to discuss these results.  Objective:   Blood pressure 122/85, pulse 85, temperature 98.3 F (36.8 C), temperature source Oral, height 5\' 8"  (1.727 m), weight 296 lb (134.3 kg), SpO2 95 %. Body mass index is 45.01 kg/m.  General: Cooperative, alert, well developed, in no acute distress. HEENT: Conjunctivae and lids unremarkable. Cardiovascular: Regular rhythm.  Lungs: Normal work of breathing. Neurologic: No focal deficits.   Lab Results  Component Value Date   CREATININE 0.96 01/03/2020   BUN 16 01/03/2020   NA 145 (H) 01/03/2020   K 3.5 01/03/2020   CL 102 01/03/2020   CO2 28 01/03/2020   Lab Results  Component Value Date   ALT 86 (H) 01/03/2020   AST 27 01/03/2020   ALKPHOS 71 01/03/2020   BILITOT 0.5 01/03/2020   Lab Results  Component Value Date   HGBA1C 5.8 (H) 01/03/2020   HGBA1C 6.1 (H) 09/13/2019   HGBA1C 6.1 (H) 05/15/2019   HGBA1C 5.6 09/26/2018   HGBA1C 4.9 05/19/2018   Lab Results  Component Value Date   INSULIN 83.4 (H) 01/03/2020   INSULIN 48.4 (H) 09/13/2019   INSULIN 94.0 (H) 05/15/2019   INSULIN 76.8 (H) 09/26/2018   INSULIN 60.7 (H) 02/16/2018   Lab Results  Component Value Date   TSH 2.200 09/26/2018   Lab Results  Component Value Date   CHOL 108 01/03/2020   HDL 30 (L) 01/03/2020   LDLCALC 32 01/03/2020   TRIG 311 (H) 01/03/2020   CHOLHDL 3.4 08/24/2017   Lab Results  Component Value Date   WBC 6.0 05/15/2019   HGB 17.3 05/15/2019   HCT 51.1 (H) 05/15/2019   MCV 81 05/15/2019   PLT 163 05/15/2019   No results found for: IRON, TIBC, FERRITIN  Obesity Behavioral Intervention Documentation for Insurance:   Approximately 15 minutes were spent on the discussion below.  ASK: We discussed the diagnosis of obesity with 13/03/2019 today and Brandon [name] agreed to give Brandon [name] permission to discuss obesity behavioral modification therapy today.  ASSESS: Brandon [name] has the diagnosis of obesity  and his BMI today is 45.02. Brandon [name] is in the action stage of change.   ADVISE: Brandon [name] was educated on the multiple health risks of obesity as well as the benefit of weight loss to improve his health. He was advised of the need for long term treatment and the importance of lifestyle modifications to improve his current health and to decrease his risk of future health problems.  AGREE: Multiple dietary modification options and treatment options were discussed and Brandon [name] agreed to follow the recommendations documented in the above note.  ARRANGE: Brandon [name] was educated on the importance of frequent visits to treat obesity as outlined per CMS and USPSTF guidelines and agreed to schedule his next follow up appointment today.  Attestation Statements:   Reviewed by clinician on day of visit: allergies, medications, problem list, medical history, surgical history, family history, social history, and previous encounter notes.   I, Brandon [name], am acting as transcriptionist for Burt Knack, MD.  I have reviewed the above documentation for accuracy and completeness, and I agree with the above. -  Quillian Quince, MD

## 2020-01-23 ENCOUNTER — Other Ambulatory Visit (INDEPENDENT_AMBULATORY_CARE_PROVIDER_SITE_OTHER): Payer: Self-pay | Admitting: Family Medicine

## 2020-01-23 DIAGNOSIS — E559 Vitamin D deficiency, unspecified: Secondary | ICD-10-CM

## 2020-01-31 ENCOUNTER — Ambulatory Visit (INDEPENDENT_AMBULATORY_CARE_PROVIDER_SITE_OTHER): Payer: Medicare Other | Admitting: Family Medicine

## 2020-02-28 ENCOUNTER — Ambulatory Visit (INDEPENDENT_AMBULATORY_CARE_PROVIDER_SITE_OTHER): Payer: Medicare Other | Admitting: Family Medicine

## 2020-02-28 ENCOUNTER — Encounter (INDEPENDENT_AMBULATORY_CARE_PROVIDER_SITE_OTHER): Payer: Self-pay | Admitting: Family Medicine

## 2020-02-28 ENCOUNTER — Other Ambulatory Visit: Payer: Self-pay

## 2020-02-28 VITALS — BP 118/72 | HR 69 | Temp 98.2°F | Ht 68.0 in | Wt 304.0 lb

## 2020-02-28 DIAGNOSIS — E785 Hyperlipidemia, unspecified: Secondary | ICD-10-CM | POA: Diagnosis not present

## 2020-02-28 DIAGNOSIS — I152 Hypertension secondary to endocrine disorders: Secondary | ICD-10-CM

## 2020-02-28 DIAGNOSIS — Z6841 Body Mass Index (BMI) 40.0 and over, adult: Secondary | ICD-10-CM

## 2020-02-28 DIAGNOSIS — E559 Vitamin D deficiency, unspecified: Secondary | ICD-10-CM | POA: Diagnosis not present

## 2020-02-28 DIAGNOSIS — E66813 Obesity, class 3: Secondary | ICD-10-CM

## 2020-02-28 DIAGNOSIS — E1169 Type 2 diabetes mellitus with other specified complication: Secondary | ICD-10-CM | POA: Diagnosis not present

## 2020-02-28 DIAGNOSIS — I10 Essential (primary) hypertension: Secondary | ICD-10-CM

## 2020-02-28 DIAGNOSIS — E1159 Type 2 diabetes mellitus with other circulatory complications: Secondary | ICD-10-CM | POA: Diagnosis not present

## 2020-02-28 MED ORDER — VITAMIN D (ERGOCALCIFEROL) 1.25 MG (50000 UNIT) PO CAPS: 50000.0000 [IU] | ORAL_CAPSULE | ORAL | 0 refills | Status: DC

## 2020-02-28 MED ORDER — VICTOZA 18 MG/3ML ~~LOC~~ SOPN: 1.2000 mg | PEN_INJECTOR | SUBCUTANEOUS | 0 refills | Status: DC

## 2020-02-28 NOTE — Progress Notes (Signed)
Chief Complaint:   OBESITY Brandon [name] is here to discuss his progress with his obesity treatment plan along with follow-up of his obesity related diagnoses. Brandon [name] is on the Category 4 Plan and states he is following his eating plan approximately 30% of the time. Brandon [name] states he is walking for 30 minutes per week.  Today's visit was #: 39 Starting weight: 305 lbs Starting date: 09/15/2017 Today's weight: 304 lbs Today's date: 02/28/2020 Total lbs lost to date: 1 Total lbs lost since last in-office visit: 0  Interim History: Brandon [name] traveled out of [place] and he did some celebration eating. Some of his weight gain is due to increased water weight. He is working on Scientific laboratory technician.  Subjective:   1. Type 2 diabetes mellitus with other specified complication, without long-term current use of insulin (HCC) Brandon [name] is missing some doses of Victoza and his weight has not improved.  2. Vitamin D deficiency Brandon [name] is on Vit D, but his Vit D level is not yet at goal.  3. Hyperlipidemia associated with type 2 diabetes mellitus (HCC) Brandon [name] is on Welchol and Lipitor, but he is struggling with weight.  4. Hypertension associated with type 2 diabetes mellitus (HCC) Brandon [name]'s blood pressure is controlled on his medications. He denies lightheadedness or chest pain.  Assessment/Plan:   1. Type 2 diabetes mellitus with other specified complication, without long-term current use of insulin (HCC) Good blood sugar control is important to decrease the likelihood of diabetic complications such as nephropathy, neuropathy, limb loss, blindness, coronary artery disease, and death. Intensive lifestyle modification including diet, exercise and weight loss are the first line of treatment for diabetes. Brandon [name] with continue metformin, and we will refill Victoza for 1 month.   - liraglutide (VICTOZA) 18 MG/3ML SOPN; Inject 0.2 mLs (1.2 mg total) into the skin every morning.   Dispense: 6 mL; Refill: 0  2. Vitamin D deficiency Low Vitamin D level contributes to fatigue and are associated with obesity, breast, and colon cancer. We will refill prescription Vitamin D for 1 month. Brandon [name] will follow-up for routine testing of Vitamin D, at least 2-3 times per year to avoid over-replacement.  - Vitamin D, Ergocalciferol, (DRISDOL) 1.25 MG (50000 UNIT) CAPS capsule; Take 1 capsule (50,000 Units total) by mouth every 7 (seven) days.  Dispense: 4 capsule; Refill: 0  3. Hyperlipidemia associated with type 2 diabetes mellitus (HCC) Cardiovascular risk and specific lipid/LDL goals reviewed. We discussed several lifestyle modifications today. Brandon [name] will continue to work on diet, exercise and weight loss efforts. Orders and follow up as documented in patient record.   4. Hypertension associated with type 2 diabetes mellitus (HCC) Brandon [name] will continue his medications, and will continue working on healthy weight loss, diet, and exercise to improve blood pressure control. We will watch for signs of hypotension as he continues his lifestyle modifications.  5. Class 3 severe obesity with serious comorbidity and body mass index (BMI) of 45.0 to 49.9 in adult, unspecified obesity type Brandon [name]) Brandon [name] is currently in the action stage of change. As such, his goal is to continue with weight loss efforts. He has agreed to the Category 4 Plan.   Exercise goals: As is.  Behavioral modification strategies: decreasing simple carbohydrates, decreasing liquid calories, decreasing sodium intake and travel eating strategies.  Brandon [name] has agreed to follow-up with our clinic in 4 weeks. He was informed of the importance of frequent follow-up visits to maximize his success with intensive lifestyle modifications for his  multiple health conditions.   Objective:   Blood pressure 118/72, pulse 69, temperature 98.2 F (36.8 C), temperature source Oral, height 5\' 8"  (1.727 m), weight (!) 304 lb (137.9  kg), SpO2 97 %. Body mass index is 46.22 kg/m.  General: Cooperative, alert, well developed, in no acute distress. HEENT: Conjunctivae and lids unremarkable. Cardiovascular: Regular rhythm.  Lungs: Normal work of breathing. Neurologic: No focal deficits.   Lab Results  Component Value Date   CREATININE 0.96 01/03/2020   BUN 16 01/03/2020   NA 145 (H) 01/03/2020   K 3.5 01/03/2020   CL 102 01/03/2020   CO2 28 01/03/2020   Lab Results  Component Value Date   ALT 86 (H) 01/03/2020   AST 27 01/03/2020   ALKPHOS 71 01/03/2020   BILITOT 0.5 01/03/2020   Lab Results  Component Value Date   HGBA1C 5.8 (H) 01/03/2020   HGBA1C 6.1 (H) 09/13/2019   HGBA1C 6.1 (H) 05/15/2019   HGBA1C 5.6 09/26/2018   HGBA1C 4.9 05/19/2018   Lab Results  Component Value Date   INSULIN 83.4 (H) 01/03/2020   INSULIN 48.4 (H) 09/13/2019   INSULIN 94.0 (H) 05/15/2019   INSULIN 76.8 (H) 09/26/2018   INSULIN 60.7 (H) 02/16/2018   Lab Results  Component Value Date   TSH 2.200 09/26/2018   Lab Results  Component Value Date   CHOL 108 01/03/2020   HDL 30 (L) 01/03/2020   LDLCALC 32 01/03/2020   TRIG 311 (H) 01/03/2020   CHOLHDL 3.4 08/24/2017   Lab Results  Component Value Date   WBC 6.0 05/15/2019   HGB 17.3 05/15/2019   HCT 51.1 (H) 05/15/2019   MCV 81 05/15/2019   PLT 163 05/15/2019   No results found for: IRON, TIBC, FERRITIN  Obesity Behavioral Intervention Documentation for Insurance:   Approximately 15 minutes were spent on the discussion below.  ASK: We discussed the diagnosis of obesity with 13/03/2019 today and Brandon [name] agreed to give Brandon [name] permission to discuss obesity behavioral modification therapy today.  ASSESS: Brandon [name] has the diagnosis of obesity and his BMI today is 46.23. Brandon [name] is in the action stage of change.   ADVISE: Brandon [name] was educated on the multiple health risks of obesity as well as the benefit of weight loss to improve his health. He was advised of the need  for long term treatment and the importance of lifestyle modifications to improve his current health and to decrease his risk of future health problems.  AGREE: Multiple dietary modification options and treatment options were discussed and Tariq agreed to follow the recommendations documented in the above note.  ARRANGE: Edris was educated on the importance of frequent visits to treat obesity as outlined per CMS and USPSTF guidelines and agreed to schedule his next follow up appointment today.  Attestation Statements:   Reviewed by clinician on day of visit: allergies, medications, problem list, medical history, surgical history, family history, social history, and previous encounter notes.   I, Brandon [name], am acting as transcriptionist for Burt Knack, MD.  I have reviewed the above documentation for accuracy and completeness, and I agree with the above. -  Brandon Quince, MD

## 2020-03-25 ENCOUNTER — Other Ambulatory Visit (INDEPENDENT_AMBULATORY_CARE_PROVIDER_SITE_OTHER): Payer: Self-pay | Admitting: Family Medicine

## 2020-03-25 DIAGNOSIS — R21 Rash and other nonspecific skin eruption: Secondary | ICD-10-CM | POA: Diagnosis not present

## 2020-03-25 DIAGNOSIS — E785 Hyperlipidemia, unspecified: Secondary | ICD-10-CM | POA: Diagnosis not present

## 2020-03-25 DIAGNOSIS — Z23 Encounter for immunization: Secondary | ICD-10-CM | POA: Diagnosis not present

## 2020-03-25 DIAGNOSIS — E119 Type 2 diabetes mellitus without complications: Secondary | ICD-10-CM | POA: Diagnosis not present

## 2020-03-25 DIAGNOSIS — E559 Vitamin D deficiency, unspecified: Secondary | ICD-10-CM

## 2020-03-25 DIAGNOSIS — Z1211 Encounter for screening for malignant neoplasm of colon: Secondary | ICD-10-CM | POA: Diagnosis not present

## 2020-03-25 DIAGNOSIS — E1159 Type 2 diabetes mellitus with other circulatory complications: Secondary | ICD-10-CM | POA: Diagnosis not present

## 2020-03-25 DIAGNOSIS — Z6841 Body Mass Index (BMI) 40.0 and over, adult: Secondary | ICD-10-CM | POA: Diagnosis not present

## 2020-03-25 DIAGNOSIS — Z79899 Other long term (current) drug therapy: Secondary | ICD-10-CM | POA: Diagnosis not present

## 2020-03-25 DIAGNOSIS — M542 Cervicalgia: Secondary | ICD-10-CM | POA: Diagnosis not present

## 2020-03-25 DIAGNOSIS — I152 Hypertension secondary to endocrine disorders: Secondary | ICD-10-CM | POA: Diagnosis not present

## 2020-03-25 DIAGNOSIS — G8929 Other chronic pain: Secondary | ICD-10-CM | POA: Diagnosis not present

## 2020-03-25 DIAGNOSIS — N529 Male erectile dysfunction, unspecified: Secondary | ICD-10-CM | POA: Diagnosis not present

## 2020-03-27 ENCOUNTER — Other Ambulatory Visit: Payer: Self-pay

## 2020-03-27 ENCOUNTER — Encounter (INDEPENDENT_AMBULATORY_CARE_PROVIDER_SITE_OTHER): Payer: Self-pay | Admitting: Family Medicine

## 2020-03-27 ENCOUNTER — Ambulatory Visit (INDEPENDENT_AMBULATORY_CARE_PROVIDER_SITE_OTHER): Payer: Medicare Other | Admitting: Family Medicine

## 2020-03-27 VITALS — BP 126/79 | HR 80 | Temp 98.0°F | Ht 68.0 in | Wt 304.0 lb

## 2020-03-27 DIAGNOSIS — E559 Vitamin D deficiency, unspecified: Secondary | ICD-10-CM | POA: Diagnosis not present

## 2020-03-27 DIAGNOSIS — E1169 Type 2 diabetes mellitus with other specified complication: Secondary | ICD-10-CM

## 2020-03-27 DIAGNOSIS — Z6841 Body Mass Index (BMI) 40.0 and over, adult: Secondary | ICD-10-CM

## 2020-03-28 MED ORDER — VITAMIN D (ERGOCALCIFEROL) 1.25 MG (50000 UNIT) PO CAPS: 50000.0000 [IU] | ORAL_CAPSULE | ORAL | 0 refills | Status: DC

## 2020-03-28 MED ORDER — VICTOZA 18 MG/3ML ~~LOC~~ SOPN: 1.2000 mg | PEN_INJECTOR | SUBCUTANEOUS | 0 refills | Status: DC

## 2020-03-28 MED ORDER — BD PEN NEEDLE NANO U/F 32G X 4 MM MISC: 1.0000 | Freq: Two times a day (BID) | 0 refills | Status: DC

## 2020-03-29 NOTE — Progress Notes (Signed)
Chief Complaint:   OBESITY Brandon [name] is here to discuss his progress with his obesity treatment plan along with follow-up of his obesity related diagnoses. Brandon [name] is on the Category 4 Plan and states he is following his eating plan approximately 10% of the time. Brandon [name] states he is doing 0 minutes 0 times per week.  Today's visit was #: 40 Starting weight: 305 lbs Starting date: 09/15/2017 Today's weight: 304 lbs Today's date: 03/27/2020 Total lbs lost to date: 1 Total lbs lost since last in-office visit: 0  Interim History: Brandon [name] has done well maintaining his weight over the last month. He has struggled to follow his eating plan, but is trying to be mindful of his food choices, and decrease simple sugars.  Subjective:   1. Type 2 diabetes mellitus with other specified complication, without long-term current use of insulin (HCC) Brandon [name]'s recent A1c was 6.8 on Victoza and metformin. He is working on decreasing sugar in his diet.  2. Vitamin D deficiency Brandon [name]'s Vit D level is still not at goal, even on Vit D weekly prescription.  Assessment/Plan:   1. Type 2 diabetes mellitus with other specified complication, without long-term current use of insulin (HCC) Good blood sugar control is important to decrease the likelihood of diabetic complications such as nephropathy, neuropathy, limb loss, blindness, coronary artery disease, and death. Intensive lifestyle modification including diet, exercise and weight loss are the first line of treatment for diabetes. Brandon [name] with continue his medications, and we will refill Victoza for 1 month and pen needles #100, with no refill.  - Insulin Pen [name] (BD PEN [name] NANO U/F) 32G X 4 MM MISC; 1 Package by Does not apply route 2 (two) times daily.  Dispense: 100 each; Refill: 0 - liraglutide (VICTOZA) 18 MG/3ML SOPN; Inject 1.2 mg into the skin every morning.  Dispense: 6 mL; Refill: 0  2. Vitamin D deficiency Low Vitamin D level  contributes to fatigue and are associated with obesity, breast, and colon cancer. Brandon [name] agreed to increase prescription Vitamin D to 50,000 IU every 3 days, with no refill. He will follow-up for routine testing of Vitamin D, at least 2-3 times per year to avoid over-replacement.  - Vitamin D, Ergocalciferol, (DRISDOL) 1.25 MG (50000 UNIT) CAPS capsule; Take 1 capsule (50,000 Units total) by mouth every 3 (three) days.  Dispense: 10 capsule; Refill: 0  3. Class 3 severe obesity with serious comorbidity and body mass index (BMI) of 45.0 to 49.9 in adult, unspecified obesity type Brandon [name]) Brandon [name] is currently in the action stage of change. As such, his goal is to continue with weight loss efforts. He has agreed to practicing portion control and making smarter food choices, such as increasing vegetables and decreasing simple carbohydrates.   Behavioral modification strategies: increasing lean protein intake and meal planning and cooking strategies.  Brandon [name] has agreed to follow-up with our clinic in 4 weeks. He was informed of the importance of frequent follow-up visits to maximize his success with intensive lifestyle modifications for his multiple health conditions.   Objective:   Blood pressure 126/79, pulse 80, temperature 98 F (36.7 C), height 5\' 8"  (1.727 m), weight (!) 304 lb (137.9 kg), SpO2 96 %. Body mass index is 46.22 kg/m.  General: Cooperative, alert, well developed, in no acute distress. HEENT: Conjunctivae and lids unremarkable. Cardiovascular: Regular rhythm.  Lungs: Normal work of breathing. Neurologic: No focal deficits.   Lab Results  Component Value Date   CREATININE 0.96 01/03/2020   BUN  16 01/03/2020   NA 145 (H) 01/03/2020   K 3.5 01/03/2020   CL 102 01/03/2020   CO2 28 01/03/2020   Lab Results  Component Value Date   ALT 86 (H) 01/03/2020   AST 27 01/03/2020   ALKPHOS 71 01/03/2020   BILITOT 0.5 01/03/2020   Lab Results  Component Value Date   HGBA1C 5.8  (H) 01/03/2020   HGBA1C 6.1 (H) 09/13/2019   HGBA1C 6.1 (H) 05/15/2019   HGBA1C 5.6 09/26/2018   HGBA1C 4.9 05/19/2018   Lab Results  Component Value Date   INSULIN 83.4 (H) 01/03/2020   INSULIN 48.4 (H) 09/13/2019   INSULIN 94.0 (H) 05/15/2019   INSULIN 76.8 (H) 09/26/2018   INSULIN 60.7 (H) 02/16/2018   Lab Results  Component Value Date   TSH 2.200 09/26/2018   Lab Results  Component Value Date   CHOL 108 01/03/2020   HDL 30 (L) 01/03/2020   LDLCALC 32 01/03/2020   TRIG 311 (H) 01/03/2020   CHOLHDL 3.4 08/24/2017   Lab Results  Component Value Date   WBC 6.0 05/15/2019   HGB 17.3 05/15/2019   HCT 51.1 (H) 05/15/2019   MCV 81 05/15/2019   PLT 163 05/15/2019   No results found for: IRON, TIBC, FERRITIN  Obesity Behavioral Intervention:   Approximately 15 minutes were spent on the discussion below.  ASK: We discussed the diagnosis of obesity with Brandon [name] today and Brandon [name] agreed to give [place] permission to discuss obesity behavioral modification therapy today.  ASSESS: Brandon [name] has the diagnosis of obesity and his BMI today is 46.23. Brandon [name] is in the action stage of change.   ADVISE: Brandon [name] was educated on the multiple health risks of obesity as well as the benefit of weight loss to improve his health. He was advised of the need for long term treatment and the importance of lifestyle modifications to improve his current health and to decrease his risk of future health problems.  AGREE: Multiple dietary modification options and treatment options were discussed and Brandon [name] agreed to follow the recommendations documented in the above note.  ARRANGE: Brandon [name] was educated on the importance of frequent visits to treat obesity as outlined per CMS and USPSTF guidelines and agreed to schedule his next follow up appointment today.  Attestation Statements:   Reviewed by clinician on day of visit: allergies, medications, problem list, medical history, surgical history,  family history, social history, and previous encounter notes.   I, Burt Knack, am acting as transcriptionist for Quillian Quince, MD.  I have reviewed the above documentation for accuracy and completeness, and I agree with the above. -  Quillian Quince, MD

## 2020-04-12 ENCOUNTER — Other Ambulatory Visit (INDEPENDENT_AMBULATORY_CARE_PROVIDER_SITE_OTHER): Payer: Self-pay | Admitting: Family Medicine

## 2020-04-12 DIAGNOSIS — E559 Vitamin D deficiency, unspecified: Secondary | ICD-10-CM

## 2020-04-22 ENCOUNTER — Ambulatory Visit (INDEPENDENT_AMBULATORY_CARE_PROVIDER_SITE_OTHER): Payer: Medicare Other | Admitting: Family Medicine

## 2020-04-22 ENCOUNTER — Encounter (INDEPENDENT_AMBULATORY_CARE_PROVIDER_SITE_OTHER): Payer: Self-pay

## 2020-07-15 ENCOUNTER — Encounter (INDEPENDENT_AMBULATORY_CARE_PROVIDER_SITE_OTHER): Payer: Self-pay | Admitting: Family Medicine

## 2020-07-15 ENCOUNTER — Ambulatory Visit (INDEPENDENT_AMBULATORY_CARE_PROVIDER_SITE_OTHER): Payer: 59 | Admitting: Family Medicine

## 2020-07-15 ENCOUNTER — Other Ambulatory Visit: Payer: Self-pay

## 2020-07-15 VITALS — BP 155/99 | HR 69 | Temp 98.7°F | Ht 68.0 in | Wt 304.0 lb

## 2020-07-15 DIAGNOSIS — E559 Vitamin D deficiency, unspecified: Secondary | ICD-10-CM

## 2020-07-15 DIAGNOSIS — E1169 Type 2 diabetes mellitus with other specified complication: Secondary | ICD-10-CM

## 2020-07-15 DIAGNOSIS — Z6841 Body Mass Index (BMI) 40.0 and over, adult: Secondary | ICD-10-CM | POA: Diagnosis not present

## 2020-07-15 DIAGNOSIS — I10 Essential (primary) hypertension: Secondary | ICD-10-CM | POA: Diagnosis not present

## 2020-07-15 MED ORDER — VITAMIN D (ERGOCALCIFEROL) 1.25 MG (50000 UNIT) PO CAPS: 50000.0000 [IU] | ORAL_CAPSULE | ORAL | 0 refills | Status: DC

## 2020-07-15 MED ORDER — BD PEN NEEDLE NANO U/F 32G X 4 MM MISC: 1.0000 | Freq: Two times a day (BID) | 0 refills | Status: DC

## 2020-07-15 MED ORDER — VICTOZA 18 MG/3ML ~~LOC~~ SOPN: 1.2000 mg | PEN_INJECTOR | SUBCUTANEOUS | 0 refills | Status: DC

## 2020-07-16 NOTE — Progress Notes (Signed)
Chief Complaint:   OBESITY Brandon [name] is here to discuss his progress with his obesity treatment plan along with follow-up of his obesity related diagnoses. Brandon [name] is on practicing portion control and making smarter food choices, such as increasing vegetables and decreasing simple carbohydrates and states he is following his eating plan approximately 15% of the time. Brandon [name] states he is doing 0 minutes 0 times per week.  Today's visit was #: 41 Starting weight: 305 lbs Starting date: 09/15/2017 Today's weight: 304 lbs Today's date: 07/15/2020 Total lbs lost to date: 1 Total lbs lost since last in-office visit: 0  Interim History: Brandon [name] has done well maintaining his weight over the holidays. He is ready to get back to a structured plan.  Subjective:   1. Type 2 diabetes mellitus with other specified complication, without long-term current use of insulin (HCC) Brandon [name] is struggling to stay on track with eating, but he is ready to get structured again. He did not bring his BGs log today, and he does not show signs of hypoglycemia.  2. Vitamin D deficiency Brandon [name] is stable on Vit D, and he is due for labs soon.  3. Essential hypertension Brandon [name]'s blood pressure is elevated today. He is on lisinopril-hydrochlorothiazide. He denies chest pain or headache, but he has gained weight in the last few months. He is ready to get back on track with weight loss.  Assessment/Plan:   1. Type 2 diabetes mellitus with other specified complication, without long-term current use of insulin (HCC) Good blood sugar control is important to decrease the likelihood of diabetic complications such as nephropathy, neuropathy, limb loss, blindness, coronary artery disease, and death. Intensive lifestyle modification including diet, exercise and weight loss are the first line of treatment for diabetes. We will refill both Victoza for 1 month, and pen needles #100 with no refills. We will recheck labs at his  next visit.  - liraglutide (VICTOZA) 18 MG/3ML SOPN; Inject 1.2 mg into the skin every morning.  Dispense: 6 mL; Refill: 0 - Insulin Pen [name] (BD PEN [name] NANO U/F) 32G X 4 MM MISC; 1 Package by Does not apply route 2 (two) times daily.  Dispense: 100 each; Refill: 0  2. Vitamin D deficiency Low Vitamin D level contributes to fatigue and are associated with obesity, breast, and colon cancer. We will refill prescription Vitamin D for 1 month, and we will recheck labs at his next visit. Brandon [name] will follow-up for routine testing of Vitamin D, at least 2-3 times per year to avoid over-replacement.  - Vitamin D, Ergocalciferol, (DRISDOL) 1.25 MG (50000 UNIT) CAPS capsule; Take 1 capsule (50,000 Units total) by mouth every 3 (three) days.  Dispense: 10 capsule; Refill: 0  3. Essential hypertension Brandon [name] will get back to Category 4 and will continue his medications, exercise and weight loss to improve blood pressure control. We will watch for signs of hypotension as he continues his lifestyle modifications. We will recheck his blood pressure in 3 weeks.  4. Class 3 severe obesity with serious comorbidity and body mass index (BMI) of 45.0 to 49.9 in adult, unspecified obesity type Brandon [name]) Brandon [name] is currently in the action stage of change. As such, his goal is to get back to weightloss efforts . He has agreed to the Category 4 Plan.   Behavioral modification strategies: meal planning and cooking strategies.  Brandon [name] has agreed to follow-up with our clinic in 3 weeks. He was informed of the importance of frequent follow-up visits to maximize his  success with intensive lifestyle modifications for his multiple health conditions.   Objective:   Blood pressure (!) 155/99, pulse 69, temperature 98.7 F (37.1 C), height 5\' 8"  (1.727 m), weight (!) 304 lb (137.9 kg), SpO2 97 %. Body mass index is 46.22 kg/m.  General: Cooperative, alert, well developed, in no acute distress. HEENT: Conjunctivae and  lids unremarkable. Cardiovascular: Regular rhythm.  Lungs: Normal work of breathing. Neurologic: No focal deficits.   Lab Results  Component Value Date   CREATININE 0.96 01/03/2020   BUN 16 01/03/2020   NA 145 (H) 01/03/2020   K 3.5 01/03/2020   CL 102 01/03/2020   CO2 28 01/03/2020   Lab Results  Component Value Date   ALT 86 (H) 01/03/2020   AST 27 01/03/2020   ALKPHOS 71 01/03/2020   BILITOT 0.5 01/03/2020   Lab Results  Component Value Date   HGBA1C 5.8 (H) 01/03/2020   HGBA1C 6.1 (H) 09/13/2019   HGBA1C 6.1 (H) 05/15/2019   HGBA1C 5.6 09/26/2018   HGBA1C 4.9 05/19/2018   Lab Results  Component Value Date   INSULIN 83.4 (H) 01/03/2020   INSULIN 48.4 (H) 09/13/2019   INSULIN 94.0 (H) 05/15/2019   INSULIN 76.8 (H) 09/26/2018   INSULIN 60.7 (H) 02/16/2018   Lab Results  Component Value Date   TSH 2.200 09/26/2018   Lab Results  Component Value Date   CHOL 108 01/03/2020   HDL 30 (L) 01/03/2020   LDLCALC 32 01/03/2020   TRIG 311 (H) 01/03/2020   CHOLHDL 3.4 08/24/2017   Lab Results  Component Value Date   WBC 6.0 05/15/2019   HGB 17.3 05/15/2019   HCT 51.1 (H) 05/15/2019   MCV 81 05/15/2019   PLT 163 05/15/2019   No results found for: IRON, TIBC, FERRITIN  Obesity Behavioral Intervention:   Approximately 15 minutes were spent on the discussion below.  ASK: We discussed the diagnosis of obesity with 13/03/2019 today and Brandon [name] agreed to give Brandon [name] permission to discuss obesity behavioral modification therapy today.  ASSESS: Brandon [name] has the diagnosis of obesity and his BMI today is 46.23. Brandon [name] is in the action stage of change.   ADVISE: Brandon [name] was educated on the multiple health risks of obesity as well as the benefit of weight loss to improve his health. He was advised of the need for long term treatment and the importance of lifestyle modifications to improve his current health and to decrease his risk of future health problems.  AGREE: Multiple  dietary modification options and treatment options were discussed and [name] agreed to follow the recommendations documented in the above note.  ARRANGE: Arbor was educated on the importance of frequent visits to treat obesity as outlined per CMS and USPSTF guidelines and agreed to schedule his next follow up appointment today.  Attestation Statements:   Reviewed by clinician on day of visit: allergies, medications, problem list, medical history, surgical history, family history, social history, and previous encounter notes.   I, Brandon [name], am acting as transcriptionist for Brandon Knack, MD.  I have reviewed the above documentation for accuracy and completeness, and I agree with the above. -  Brandon Quince, MD

## 2020-08-06 ENCOUNTER — Other Ambulatory Visit: Payer: Self-pay

## 2020-08-06 ENCOUNTER — Ambulatory Visit (INDEPENDENT_AMBULATORY_CARE_PROVIDER_SITE_OTHER): Payer: 59 | Admitting: Adult Health

## 2020-08-06 ENCOUNTER — Encounter (INDEPENDENT_AMBULATORY_CARE_PROVIDER_SITE_OTHER): Payer: Self-pay | Admitting: Family Medicine

## 2020-08-06 ENCOUNTER — Ambulatory Visit (INDEPENDENT_AMBULATORY_CARE_PROVIDER_SITE_OTHER): Payer: 59 | Admitting: Family Medicine

## 2020-08-06 VITALS — BP 143/83 | HR 80 | Temp 97.8°F | Ht 68.0 in | Wt 300.0 lb

## 2020-08-06 VITALS — BP 143/87 | HR 74 | Ht 68.0 in | Wt 302.0 lb

## 2020-08-06 DIAGNOSIS — I10 Essential (primary) hypertension: Secondary | ICD-10-CM | POA: Diagnosis not present

## 2020-08-06 DIAGNOSIS — E559 Vitamin D deficiency, unspecified: Secondary | ICD-10-CM

## 2020-08-06 DIAGNOSIS — E785 Hyperlipidemia, unspecified: Secondary | ICD-10-CM | POA: Diagnosis not present

## 2020-08-06 DIAGNOSIS — Z9989 Dependence on other enabling machines and devices: Secondary | ICD-10-CM

## 2020-08-06 DIAGNOSIS — E1169 Type 2 diabetes mellitus with other specified complication: Secondary | ICD-10-CM

## 2020-08-06 DIAGNOSIS — G4733 Obstructive sleep apnea (adult) (pediatric): Secondary | ICD-10-CM

## 2020-08-06 DIAGNOSIS — Z6841 Body Mass Index (BMI) 40.0 and over, adult: Secondary | ICD-10-CM

## 2020-08-06 NOTE — Patient Instructions (Signed)
Continue using CPAP nightly and greater than 4 hours each night °If your symptoms worsen or you develop new symptoms please let us know.  ° °

## 2020-08-06 NOTE — Progress Notes (Signed)
PATIENT: Brandon [name] DOB: 1971-04-01  REASON FOR VISIT: follow up HISTORY FROM: patient  HISTORY OF PRESENT ILLNESS: Today 08/06/20:  Mr. Koski is a 50 year old male with a history of obstructive sleep apnea on CPAP.  He reports that the CPAP continues to work well for him.  He denies any new issues.  Reports that he tries to change out his supplies regularly.  His download is below:  Compliance Report Usage 07/07/2020 - 08/05/2020 Usage days 29/30 days (97%) >= 4 hours 22 days (73%) Average usage (days used) 5 hours 56 minutes  AirSense 10 AutoSet Serial number [id number] Mode AutoSet Min Pressure 5 cmH2O Max Pressure 18 cmH2O EPR Fulltime EPR level 3  Therapy Pressure - cmH2O Median: 12.7 95th percentile: 17.4 Maximum: 17.7 Leaks - L/min Median: 1.5 95th percentile: 20.4 Maximum: 40.4 Events per hour AI: 1.5 HI: 0.6 AHI: 2.1 Apnea Index Central: 0.0 Obstructive: 1.4 Unknown: 0.1  HISTORY 05/16/19:  Mr. Stelmach is a 50 year old male with a history of obstructive sleep apnea on CPAP.  His download indicates that he use his machine nightly for compliance of 100%.  He uses machine greater than 4 hours 81 out of 90 days for compliance of 90%.  On average he uses his machine 8 hours and 2 minutes.  His residual AHI is 1.4 on 5 to 18 cm water with EPR 3.  His leak in the 95th percentile is 25.8 L/min.  He reports that the CPAP is working well for him.  He returns today for an evaluation.He reports CPAP is working well for him.   REVIEW OF SYSTEMS: Out of a complete 14 system review of symptoms, the patient complains only of the following symptoms, and all other reviewed systems are negative.  FSS 49 ESS 4  ALLERGIES: Allergies  Allergen Reactions  . Amoxicillin Anaphylaxis    Has patient had a PCN reaction causing immediate rash, facial/tongue/throat swelling, SOB or lightheadedness with hypotension: Yes Has patient had a PCN reaction causing severe rash involving mucus  membranes or skin necrosis: No Has patient had a PCN reaction that required hospitalization: No Has patient had a PCN reaction occurring within the last 10 years: No If all of the above answers are "NO", then may proceed with Cephalosporin use.   [name] Penicillin G Anaphylaxis    Gets extremely high fevers. Has patient had a PCN reaction causing immediate rash, facial/tongue/throat swelling, SOB or lightheadedness with hypotension: Yes Has patient had a PCN reaction causing severe rash involving mucus membranes or skin necrosis: No Has patient had a PCN reaction that required hospitalization: No Has patient had a PCN reaction occurring within the last 10 years: No If all of the above answers are "NO", then may proceed with Cephalosporin use.   . Sulfa Antibiotics Other (See Comments)    It will kill him  . Cephalosporins Other (See Comments)    Reaction unspecified.  . Quinolones Other (See Comments)    Reaction unspecified  . Sulfamethoxazole Swelling    HOME MEDICATIONS: Outpatient Medications Prior to Visit  Medication Sig Dispense Refill  . atorvastatin (LIPITOR) 20 MG tablet TAKE 1 TABLET (20 MG TOTAL) BY MOUTH AT BEDTIME. (Patient taking differently: Take 20 mg by mouth at bedtime.) 30 tablet 0  . colesevelam (WELCHOL) 625 MG tablet Take 3 tablets (1,875 mg total) by mouth daily with breakfast. 540 tablet 1  . cyclobenzaprine (AMRIX) 15 MG 24 hr capsule Take 15 mg by mouth 2 (two) times daily.    [name]  gabapentin (NEURONTIN) 300 MG capsule Take 3 capsules (900 mg total) by mouth 2 (two) times daily. 540 capsule 1  . Insulin Pen Needle (BD PEN NEEDLE NANO U/F) 32G X 4 MM MISC 1 Package by Does not apply route 2 (two) times daily. 100 each 0  . liraglutide (VICTOZA) 18 MG/3ML SOPN Inject 1.2 mg into the skin every morning. 6 mL 0  . lisinopril-hydrochlorothiazide (ZESTORETIC) 20-12.5 MG tablet Take 1 tablet by mouth daily. 30 tablet 0  . metFORMIN (GLUCOPHAGE-XR) 500 MG 24 hr tablet TAKE  2 TABLETS (1,000 MG TOTAL) BY MOUTH 2 (TWO) TIMES DAILY. (Patient taking differently: Take 1,000 mg by mouth 2 (two) times daily. TAKE 2 TABLETS (1,000 MG TOTAL) BY MOUTH 2 (TWO) TIMES DAILY.) 360 tablet 1  . NON FORMULARY CPAP machine at bedtime & sleep    . Vitamin D, Ergocalciferol, (DRISDOL) 1.25 MG (50000 UNIT) CAPS capsule Take 1 capsule (50,000 Units total) by mouth every 3 (three) days. 10 capsule 0   No facility-administered medications prior to visit.    PAST MEDICAL HISTORY: Past Medical History:  Diagnosis Date  . ADEM (acute disseminated encephalomyelitis)   . Allergy   . Anxiety 2005  . Complication of anesthesia   . Constipation   . Depression 2005  . Diabetes mellitus without complication (HCC)    Type 2  . Difficult intubation    difficult intubating and exbutating "throat Clinched around tube" during an Endoscopy procedure  . Dyspnea    with exertion  . Headache(784.0)   . Hearing loss   . Hyperinsulinemia 07/04/11   "I'm on metformin cause I produce too much insulin"  . Hyperlipidemia   . Hypertension   . Mononucleosis, infectious, with hepatitis 06/2011  . MS (multiple sclerosis) (HCC)    patinet has ADEM not MS per Patient  . Nausea    occasional per history form 08/18/11  . Neuromuscular disorder (HCC)   . Pneumonia 2010  . Sleep apnea    uses CPAP at home  . Stroke (HCC) 2000   TIA  . TIA (transient ischemic attack) 2000   denies residual  . TIA (transient ischemic attack)     PAST SURGICAL HISTORY: Past Surgical History:  Procedure Laterality Date  . ANTERIOR CERVICAL DECOMP/DISCECTOMY FUSION N/A 10/21/2016   Procedure: ANTERIOR CERVICAL DECOMPRESSION/DISCECTOMY FUSION, INTERBODY PROSTHESIS,PLATE CERVICAL FOUR- CERVICAL FIVE, CERVICAL FIVE- CERVICAL SIX;  Surgeon: Tressie Stalker, MD;  Location: [facility] OR;  Service: Neurosurgery;  Laterality: N/A;  . ANTERIOR CERVICAL DECOMP/DISCECTOMY FUSION N/A 05/19/2018   Procedure: ANTERIOR CERVICAL  DECOMPRESSION/DISCECTOMY FUSION, INTERBODY PROSTHESIS,PLATE/SCREWS CERVICAL SIX- CERVICAL SEVEN; EXPLORE FUSION;  Surgeon: Tressie Stalker, MD;  Location: [facility] OR;  Service: Neurosurgery;  Laterality: N/A;  . CHOLECYSTECTOMY  08/02/2011   Procedure: LAPAROSCOPIC CHOLECYSTECTOMY;  Surgeon: Shelly Rubenstein, MD;  Location: [facility] OR;  Service: General;;  . COLONOSCOPY  2003  . ESOPHAGOGASTRODUODENOSCOPY  2003  . ESOPHAGOGASTRODUODENOSCOPY  07/28/2011   Procedure: ESOPHAGOGASTRODUODENOSCOPY (EGD);  Surgeon: [name], MD;  Location: [facility] ENDOSCOPY;  Service: Endoscopy;  Laterality: N/A;  . MYRINGOTOMY  1979; 1980   bilaterally  . TONSILLECTOMY AND ADENOIDECTOMY  ~ 1980    FAMILY HISTORY: Family History  Problem Relation Age of Onset  . Coronary artery disease Father   . Stroke Father   . Cancer Father   . High blood pressure Father   . Alcoholism Father   . Diabetes Mother   . Obesity Mother   . Cancer Maternal Grandmother  bone  . Cancer Other        Stomach -Great-great grandmother    SOCIAL HISTORY: Social History   Socioeconomic History  . Marital status: Married    Spouse name: Not on file  . Number of children: 0  . Years of education: college  . Highest education level: Not on file  Occupational History  . Occupation: disabled   Tobacco Use  . Smoking status: Former Smoker    Packs/day: 3.00    Years: 3.00    Pack years: 9.00    Types: Cigarettes    Quit date: 08/18/1987    Years since quitting: 32.9  . Smokeless tobacco: Never Used  Vaping Use  . Vaping Use: Never used  Substance and Sexual Activity  . Alcohol use: Not Currently    Alcohol/week: 0.0 standard drinks    Comment: 07/04/11 "glass of wine or spirits once a month"  . Drug use: Not Currently    Types: Marijuana    Comment: have not used in a 1.5 years  . Sexual activity: Yes    Partners: Female  Other Topics Concern  . Not on file  Social History Narrative   Lives in [place] with 2  domestic male partners "his family unit"   Caffeine: minimal   Social Determinants of Health   Financial Resource Strain: Not on file  Food Insecurity: Not on file  Transportation Needs: Not on file  Physical Activity: Not on file  Stress: Not on file  Social Connections: Not on file  Intimate Partner Violence: Not on file      PHYSICAL EXAM  Vitals:   08/06/20 0820  BP: (!) 143/87  Pulse: 74  Weight: (!) 302 lb (137 kg)  Height: 5\' 8"  (1.727 m)   Body mass index is 45.92 kg/m.  Generalized: Well developed, in no acute distress  Chest: Lungs clear to auscultation bilaterally  Neurological examination  Mentation: Alert oriented to time, place, history taking. Follows all commands speech and language fluent Cranial nerve II-XII: Extraocular movements were full, visual field were full on confrontational test Head turning and shoulder shrug  were normal and symmetric. Motor: The motor testing reveals 5 over 5 strength of all 4 extremities. Good symmetric motor tone is noted throughout.  Sensory: Sensory testing is intact to soft touch on all 4 extremities. No evidence of extinction is noted.  Gait and station: Gait is normal.    DIAGNOSTIC DATA (LABS, IMAGING, TESTING) - I reviewed patient records, labs, notes, testing and imaging myself where available.  Lab Results  Component Value Date   WBC 6.0 05/15/2019   HGB 17.3 05/15/2019   HCT 51.1 (H) 05/15/2019   MCV 81 05/15/2019   PLT 163 05/15/2019      Component Value Date/Time   NA 145 (H) 01/03/2020 1707   K 3.5 01/03/2020 1707   CL 102 01/03/2020 1707   CO2 28 01/03/2020 1707   GLUCOSE 91 01/03/2020 1707   GLUCOSE 83 05/13/2018 0957   BUN 16 01/03/2020 1707   CREATININE 0.96 01/03/2020 1707   CREATININE 1.02 08/24/2017 1458   CALCIUM 9.7 01/03/2020 1707   PROT 7.0 01/03/2020 1707   ALBUMIN 4.6 01/03/2020 1707   AST 27 01/03/2020 1707   ALT 86 (H) 01/03/2020 1707   ALKPHOS 71 01/03/2020 1707   BILITOT  0.5 01/03/2020 1707   GFRNONAA 93 01/03/2020 1707   GFRNONAA >89 07/18/2015 1656   GFRAA 108 01/03/2020 1707   GFRAA >89 07/18/2015 1656  Lab Results  Component Value Date   CHOL 108 01/03/2020   HDL 30 (L) 01/03/2020   LDLCALC 32 01/03/2020   TRIG 311 (H) 01/03/2020   CHOLHDL 3.4 08/24/2017   Lab Results  Component Value Date   HGBA1C 5.8 (H) 01/03/2020   Lab Results  Component Value Date   VITAMINB12 502 09/26/2018   Lab Results  Component Value Date   TSH 2.200 09/26/2018      ASSESSMENT AND PLAN 50 y.o. year old male  has a past medical history of ADEM (acute disseminated encephalomyelitis), Allergy, Anxiety (2005), Complication of anesthesia, Constipation, Depression (2005), Diabetes mellitus without complication (HCC), Difficult intubation, Dyspnea, Headache(784.0), Hearing loss, Hyperinsulinemia (07/04/11), Hyperlipidemia, Hypertension, Mononucleosis, infectious, with hepatitis (06/2011), MS (multiple sclerosis) (HCC), Nausea, Neuromuscular disorder (HCC), Pneumonia (2010), Sleep apnea, Stroke (HCC) (2000), TIA (transient ischemic attack) (2000), and TIA (transient ischemic attack). here with:  1. OSA on CPAP  - CPAP compliance excellent - Good treatment of AHI  - Encourage patient to use CPAP nightly and > 4 hours each night - F/U in 1 year or sooner if needed   I spent 20 minutes of face-to-face and non-face-to-face time with patient.  This included previsit chart review, lab review, study review, order entry, electronic health record documentation, patient education.  Butch Penny, MSN, NP-C 08/06/2020, 8:38 AM [facility] [address] [place], [place] [address] [phone]

## 2020-08-07 LAB — COMPREHENSIVE METABOLIC PANEL
ALT: 84 IU/L — ABNORMAL HIGH (ref 0–44)
AST: 35 IU/L (ref 0–40)
Albumin/Globulin Ratio: 2.2 (ref 1.2–2.2)
Albumin: 4.8 g/dL (ref 4.0–5.0)
Alkaline Phosphatase: 65 IU/L (ref 44–121)
BUN/Creatinine Ratio: 14 (ref 9–20)
BUN: 13 mg/dL (ref 6–24)
Bilirubin Total: 0.6 mg/dL (ref 0.0–1.2)
CO2: 27 mmol/L (ref 20–29)
Calcium: 9.9 mg/dL (ref 8.7–10.2)
Chloride: 104 mmol/L (ref 96–106)
Creatinine, Ser: 0.93 mg/dL (ref 0.76–1.27)
GFR calc Af Amer: 112 mL/min/{1.73_m2} (ref >59)
GFR calc non Af Amer: 97 mL/min/{1.73_m2} (ref >59)
Globulin, Total: 2.2 g/dL (ref 1.5–4.5)
Glucose: 94 mg/dL (ref 65–99)
Potassium: 3.7 mmol/L (ref 3.5–5.2)
Sodium: 143 mmol/L (ref 134–144)
Total Protein: 7 g/dL (ref 6.0–8.5)

## 2020-08-07 LAB — VITAMIN D 25 HYDROXY (VIT D DEFICIENCY, FRACTURES): Vit D, 25-Hydroxy: 38.1 ng/mL (ref 30.0–100.0)

## 2020-08-07 LAB — MICROALBUMIN / CREATININE URINE RATIO
Creatinine, Urine: 189.9 mg/dL
Microalb/Creat Ratio: 27 mg/g creat (ref 0–29)
Microalbumin, Urine: 51 ug/mL

## 2020-08-07 LAB — LIPID PANEL WITH LDL/HDL RATIO
Cholesterol, Total: 99 mg/dL — ABNORMAL LOW (ref 100–199)
HDL: 36 mg/dL — ABNORMAL LOW (ref >39)
LDL Chol Calc (NIH): 37 mg/dL (ref 0–99)
LDL/HDL Ratio: 1 ratio (ref 0.0–3.6)
Triglycerides: 154 mg/dL — ABNORMAL HIGH (ref 0–149)
VLDL Cholesterol Cal: 26 mg/dL (ref 5–40)

## 2020-08-07 LAB — INSULIN, RANDOM: INSULIN: 185 u[IU]/mL — ABNORMAL HIGH (ref 2.6–24.9)

## 2020-08-07 LAB — HEMOGLOBIN A1C
Est. average glucose Bld gHb Est-mCnc: 146 mg/dL
Hgb A1c MFr Bld: 6.7 % — ABNORMAL HIGH (ref 4.8–5.6)

## 2020-08-07 NOTE — Progress Notes (Signed)
Chief Complaint:   OBESITY Camdyn is here to discuss his progress with his obesity treatment plan along with follow-up of his obesity related diagnoses. Carmelo is on the Category 4 Plan and states he is following his eating plan approximately 40% of the time. Melquisedec states he is doing 0 minutes 0 times per week.  Today's visit was #: 42 Starting weight: 305 lbs Starting date: 09/15/2017 Today's weight: 300 lbs Today's date: 08/06/2020 Total lbs lost to date: 5 Total lbs lost since last in-office visit: 4  Interim History: Dillon continues to do well with weight loss. He is making some deviations, but he is trying to decrease simple carbohydrates and increase lean protein. He has had some GI issues but he is working with his primary care provider for this.  Subjective:   1. Type 2 diabetes mellitus with other specified complication, without long-term current use of insulin (HCC) Michah is back on Victoza and he is doing well with weight loss again. He has minimized his simple carbohydrates and this should be helping.  2. Vitamin D deficiency Frans is on Vit D, and he is due to have labs checked. He went through a period when he forgot to take it however.  3. Essential hypertension Little's blood pressure is elevated today. He is working on diet and weight loss. He denies chest pain.  4. Hyperlipidemia associated with type 2 diabetes mellitus (HCC) Talmadge is working on weight loss. He is due to have labs checked.  Assessment/Plan:   1. Type 2 diabetes mellitus with other specified complication, without long-term current use of insulin (HCC) Good blood sugar control is important to decrease the likelihood of diabetic complications such as nephropathy, neuropathy, limb loss, blindness, coronary artery disease, and death. Intensive lifestyle modification including diet, exercise and weight loss are the first line of treatment for diabetes. We will check labs today. Reece  will continue with diet, weight loss, and Victoza.  - Comprehensive metabolic panel - Hemoglobin A1c - Insulin, random - Microalbumin / creatinine urine ratio  2. Vitamin D deficiency Low Vitamin D level contributes to fatigue and are associated with obesity, breast, and colon cancer. We will check labs today. Rafi will follow-up for routine testing of Vitamin D, at least 2-3 times per year to avoid over-replacement.  - VITAMIN D 25 Hydroxy (Vit-D Deficiency, Fractures)  3. Essential hypertension Kyren will continue working on healthy weight loss efforts and exercise to improve blood pressure control. We will watch for signs of hypotension as he continues his lifestyle modifications. We will check labs today.  4. Hyperlipidemia associated with type 2 diabetes mellitus (HCC) Cardiovascular risk and specific lipid/LDL goals reviewed. We discussed several lifestyle modifications today. We will check labs today. Keyonta will continue to work on diet, exercise and weight loss efforts. Orders and follow up as documented in patient record.   - Lipid Panel With LDL/HDL Ratio  5. Class 3 severe obesity with serious comorbidity and body mass index (BMI) of 45.0 to 49.9 in adult, unspecified obesity type [facility]) Samar is currently in the action stage of change. As such, his goal is to continue with weight loss efforts. He has agreed to the Category 4 Plan.   Behavioral modification strategies: increasing lean protein intake.  Jayon has agreed to follow-up with our clinic in 3 weeks. He was informed of the importance of frequent follow-up visits to maximize his success with intensive lifestyle modifications for his multiple health conditions.   Lloyd was  informed we would discuss his lab results at his next visit unless there is a critical issue that needs to be addressed sooner. Saman agreed to keep his next visit at the agreed upon time to discuss these results.  Objective:   Blood  pressure (!) 143/83, pulse 80, temperature 97.8 F (36.6 C), height 5\' 8"  (1.727 m), weight 300 lb (136.1 kg), SpO2 97 %. Body mass index is 45.61 kg/m.  General: Cooperative, alert, well developed, in no acute distress. HEENT: Conjunctivae and lids unremarkable. Cardiovascular: Regular rhythm.  Lungs: Normal work of breathing. Neurologic: No focal deficits.   Lab Results  Component Value Date   CREATININE 0.93 08/06/2020   BUN 13 08/06/2020   NA 143 08/06/2020   K 3.7 08/06/2020   CL 104 08/06/2020   CO2 27 08/06/2020   Lab Results  Component Value Date   ALT 84 (H) 08/06/2020   AST 35 08/06/2020   ALKPHOS 65 08/06/2020   BILITOT 0.6 08/06/2020   Lab Results  Component Value Date   HGBA1C 6.7 (H) 08/06/2020   HGBA1C 5.8 (H) 01/03/2020   HGBA1C 6.1 (H) 09/13/2019   HGBA1C 6.1 (H) 05/15/2019   HGBA1C 5.6 09/26/2018   Lab Results  Component Value Date   INSULIN 185.0 (H) 08/06/2020   INSULIN 83.4 (H) 01/03/2020   INSULIN 48.4 (H) 09/13/2019   INSULIN 94.0 (H) 05/15/2019   INSULIN 76.8 (H) 09/26/2018   Lab Results  Component Value Date   TSH 2.200 09/26/2018   Lab Results  Component Value Date   CHOL 99 (L) 08/06/2020   HDL 36 (L) 08/06/2020   LDLCALC 37 08/06/2020   TRIG 154 (H) 08/06/2020   CHOLHDL 3.4 08/24/2017   Lab Results  Component Value Date   WBC 6.0 05/15/2019   HGB 17.3 05/15/2019   HCT 51.1 (H) 05/15/2019   MCV 81 05/15/2019   PLT 163 05/15/2019   No results found for: IRON, TIBC, FERRITIN  Obesity Behavioral Intervention:   Approximately 15 minutes were spent on the discussion below.  ASK: We discussed the diagnosis of obesity with 13/03/2019 today and Max agreed to give [name] permission to discuss obesity behavioral modification therapy today.  ASSESS: Aydrien has the diagnosis of obesity and his BMI today is 45.63. Avier is in the action stage of change.   ADVISE: Brayon was educated on the multiple health risks of obesity as  well as the benefit of weight loss to improve his health. He was advised of the need for long term treatment and the importance of lifestyle modifications to improve his current health and to decrease his risk of future health problems.  AGREE: Multiple dietary modification options and treatment options were discussed and Orestes agreed to follow the recommendations documented in the above note.  ARRANGE: Robbin was educated on the importance of frequent visits to treat obesity as outlined per CMS and USPSTF guidelines and agreed to schedule his next follow up appointment today.  Attestation Statements:   Reviewed by clinician on day of visit: allergies, medications, problem list, medical history, surgical history, family history, social history, and previous encounter notes.   I, [name], am acting as transcriptionist for Burt Knack, MD.  I have reviewed the above documentation for accuracy and completeness, and I agree with the above. -  Quillian Quince, MD

## 2020-08-14 ENCOUNTER — Ambulatory Visit (INDEPENDENT_AMBULATORY_CARE_PROVIDER_SITE_OTHER): Payer: Medicare Other | Admitting: Family Medicine

## 2020-08-27 ENCOUNTER — Other Ambulatory Visit: Payer: Self-pay

## 2020-08-27 ENCOUNTER — Encounter (INDEPENDENT_AMBULATORY_CARE_PROVIDER_SITE_OTHER): Payer: Self-pay | Admitting: Family Medicine

## 2020-08-27 ENCOUNTER — Ambulatory Visit (INDEPENDENT_AMBULATORY_CARE_PROVIDER_SITE_OTHER): Payer: 59 | Admitting: Family Medicine

## 2020-08-27 VITALS — BP 133/79 | HR 88 | Temp 98.6°F | Ht 68.0 in | Wt 304.0 lb

## 2020-08-27 DIAGNOSIS — M25562 Pain in left knee: Secondary | ICD-10-CM

## 2020-08-27 DIAGNOSIS — M25561 Pain in right knee: Secondary | ICD-10-CM | POA: Diagnosis not present

## 2020-08-27 DIAGNOSIS — Z6841 Body Mass Index (BMI) 40.0 and over, adult: Secondary | ICD-10-CM

## 2020-08-28 NOTE — Progress Notes (Signed)
Chief Complaint:   OBESITY Brandon [name] is here to discuss his progress with his obesity treatment plan along with follow-up of his obesity related diagnoses. Brandon [name] is on the Category 4 Plan and states he is following his eating plan approximately 45% of the time. Brandon [name] states he is doing 0 minutes 0 times per week.  Today's visit was #: 43 Starting weight: 305 lbs Starting date: 09/15/2017 Today's weight: 304 lbs Today's date: 08/27/2020 Total lbs lost to date: 1 Total lbs lost since last in-office visit: 0  Interim History: Brandon [name] has had increased GI upset, and he wasn't able to follow his plan closely. His simple carbohydrates increased as this is what he could tolerate. He is starting to feel better and he is hoping to get back on track. Most of his weight gain appears to be fluid retention.  Subjective:   1. Pain in both knees, unspecified chronicity Brandon [name] is unable to exercise due to both knee pain. He is seeing his PA, Benny Lennert in 2 weeks to discuss this.  Assessment/Plan:   1. Pain in both knees, unspecified chronicity Brandon [name] was advised to continue trying to move his knees to help decrease swelling.  2. Class 3 severe obesity with serious comorbidity and body mass index (BMI) of 45.0 to 49.9 in adult, unspecified obesity type [facility]) Brandon [name] is currently in the action stage of change. As such, his goal is to continue with weight loss efforts. He has agreed to the Category 4 Plan.   Exercise goals: All adults should avoid inactivity. Some physical activity is better than none, and adults who participate in any amount of physical activity gain some health benefits.  Behavioral modification strategies: increasing lean protein intake.  Brandon [name] has agreed to follow-up with our clinic in 3 to 4 weeks. He was informed of the importance of frequent follow-up visits to maximize his success with intensive lifestyle modifications for his multiple health conditions.    Objective:   Blood pressure 133/79, pulse 88, temperature 98.6 F (37 C), height 5\' 8"  (1.727 m), weight (!) 304 lb (137.9 kg), SpO2 100 %. Body mass index is 46.22 kg/m.  General: Cooperative, alert, well developed, in no acute distress. HEENT: Conjunctivae and lids unremarkable. Cardiovascular: Regular rhythm.  Lungs: Normal work of breathing. Neurologic: No focal deficits.   Lab Results  Component Value Date   CREATININE 0.93 08/06/2020   BUN 13 08/06/2020   NA 143 08/06/2020   K 3.7 08/06/2020   CL 104 08/06/2020   CO2 27 08/06/2020   Lab Results  Component Value Date   ALT 84 (H) 08/06/2020   AST 35 08/06/2020   ALKPHOS 65 08/06/2020   BILITOT 0.6 08/06/2020   Lab Results  Component Value Date   HGBA1C 6.7 (H) 08/06/2020   HGBA1C 5.8 (H) 01/03/2020   HGBA1C 6.1 (H) 09/13/2019   HGBA1C 6.1 (H) 05/15/2019   HGBA1C 5.6 09/26/2018   Lab Results  Component Value Date   INSULIN 185.0 (H) 08/06/2020   INSULIN 83.4 (H) 01/03/2020   INSULIN 48.4 (H) 09/13/2019   INSULIN 94.0 (H) 05/15/2019   INSULIN 76.8 (H) 09/26/2018   Lab Results  Component Value Date   TSH 2.200 09/26/2018   Lab Results  Component Value Date   CHOL 99 (L) 08/06/2020   HDL 36 (L) 08/06/2020   LDLCALC 37 08/06/2020   TRIG 154 (H) 08/06/2020   CHOLHDL 3.4 08/24/2017   Lab Results  Component Value Date   WBC 6.0  05/15/2019   HGB 17.3 05/15/2019   HCT 51.1 (H) 05/15/2019   MCV 81 05/15/2019   PLT 163 05/15/2019   No results found for: IRON, TIBC, FERRITIN  Attestation Statements:   Reviewed by clinician on day of visit: allergies, medications, problem list, medical history, surgical history, family history, social history, and previous encounter notes.  Time spent on visit including pre-visit chart review and post-visit care and charting was 30 minutes.    I, Burt Knack, am acting as transcriptionist for Quillian Quince, MD.  I have reviewed the above documentation for  accuracy and completeness, and I agree with the above. -  Quillian Quince, MD

## 2020-09-23 DIAGNOSIS — I152 Hypertension secondary to endocrine disorders: Secondary | ICD-10-CM | POA: Diagnosis not present

## 2020-09-23 DIAGNOSIS — Z6841 Body Mass Index (BMI) 40.0 and over, adult: Secondary | ICD-10-CM | POA: Diagnosis not present

## 2020-09-23 DIAGNOSIS — Z1211 Encounter for screening for malignant neoplasm of colon: Secondary | ICD-10-CM | POA: Diagnosis not present

## 2020-09-23 DIAGNOSIS — E785 Hyperlipidemia, unspecified: Secondary | ICD-10-CM | POA: Diagnosis not present

## 2020-09-23 DIAGNOSIS — E1159 Type 2 diabetes mellitus with other circulatory complications: Secondary | ICD-10-CM | POA: Diagnosis not present

## 2020-09-23 DIAGNOSIS — M170 Bilateral primary osteoarthritis of knee: Secondary | ICD-10-CM | POA: Diagnosis not present

## 2020-09-23 DIAGNOSIS — M542 Cervicalgia: Secondary | ICD-10-CM | POA: Diagnosis not present

## 2020-09-23 DIAGNOSIS — R21 Rash and other nonspecific skin eruption: Secondary | ICD-10-CM | POA: Diagnosis not present

## 2020-09-23 DIAGNOSIS — Z0000 Encounter for general adult medical examination without abnormal findings: Secondary | ICD-10-CM | POA: Diagnosis not present

## 2020-09-23 DIAGNOSIS — M5030 Other cervical disc degeneration, unspecified cervical region: Secondary | ICD-10-CM | POA: Diagnosis not present

## 2020-09-23 DIAGNOSIS — M5020 Other cervical disc displacement, unspecified cervical region: Secondary | ICD-10-CM | POA: Diagnosis not present

## 2020-09-26 ENCOUNTER — Other Ambulatory Visit: Payer: Self-pay

## 2020-09-26 ENCOUNTER — Ambulatory Visit (INDEPENDENT_AMBULATORY_CARE_PROVIDER_SITE_OTHER): Payer: 59 | Admitting: Family Medicine

## 2020-09-26 ENCOUNTER — Encounter (INDEPENDENT_AMBULATORY_CARE_PROVIDER_SITE_OTHER): Payer: Self-pay | Admitting: Family Medicine

## 2020-09-26 VITALS — BP 137/78 | HR 65 | Temp 98.1°F | Ht 68.0 in | Wt 308.0 lb

## 2020-09-26 DIAGNOSIS — E559 Vitamin D deficiency, unspecified: Secondary | ICD-10-CM

## 2020-09-26 DIAGNOSIS — Z6841 Body Mass Index (BMI) 40.0 and over, adult: Secondary | ICD-10-CM | POA: Diagnosis not present

## 2020-09-26 MED ORDER — VITAMIN D (ERGOCALCIFEROL) 1.25 MG (50000 UNIT) PO CAPS: 50000.0000 [IU] | ORAL_CAPSULE | ORAL | 0 refills | Status: DC

## 2020-10-01 NOTE — Progress Notes (Signed)
Chief Complaint:   OBESITY Brandon [name] is here to discuss his progress with his obesity treatment plan along with follow-up of his obesity related diagnoses. Brandon [name] is on the Category 4 Plan and states he is following his eating plan approximately 30% of the time. Brandon [name] states he is doing 0 minutes 0 times per week.  Today's visit was #: 44 Starting weight: 305 lbs Starting date: 09/15/2017 Today's weight: 308 lbs Today's date: 09/26/2020 Total lbs lost to date: 0 Total lbs lost since last in-office visit: 0  Interim History: Brandon [name] is in the process of moving and this has been especially painful. He has especially struggled with meal planning and cravings have increased significantly.   Subjective:   1. Vitamin D deficiency Brandon [name]'s Vit D is slowly improving, but not yet at goal.   Assessment/Plan:   1. Vitamin D deficiency Low Vitamin D level contributes to fatigue and are associated with obesity, breast, and colon cancer. We will refill prescription Vitamin D for 1 month. Brandon [name] will follow-up for routine testing of Vitamin D, at least 2-3 times per year to avoid over-replacement.  - Vitamin D, Ergocalciferol, (DRISDOL) 1.25 MG (50000 UNIT) CAPS capsule; Take 1 capsule (50,000 Units total) by mouth every 3 (three) days.  Dispense: 10 capsule; Refill: 0  2. Obesity with current BMI of 46.8 Brandon [name] is not currently in the action stage of change. As such, his goal is to maintain weight for now. He has agreed to practicing portion control and making smarter food choices, such as increasing vegetables and decreasing simple carbohydrates.   Brandon [name]'s goal is to avoid weight gain until he is at a point when he can be in the active stage of change.  Behavioral modification strategies: increasing lean protein intake and meal planning and cooking strategies.  Brandon [name] has agreed to follow-up with our clinic in 3 to 4 weeks. He was informed of the importance of frequent follow-up  visits to maximize his success with intensive lifestyle modifications for his multiple health conditions.   Objective:   Blood pressure 137/78, pulse 65, temperature 98.1 F (36.7 C), height 5\' 8"  (1.727 m), weight (!) 308 lb (139.7 kg), SpO2 96 %. Body mass index is 46.83 kg/m.  General: Cooperative, alert, well developed, in no acute distress. HEENT: Conjunctivae and lids unremarkable. Cardiovascular: Regular rhythm.  Lungs: Normal work of breathing. Neurologic: No focal deficits.   Lab Results  Component Value Date   CREATININE 0.93 08/06/2020   BUN 13 08/06/2020   NA 143 08/06/2020   K 3.7 08/06/2020   CL 104 08/06/2020   CO2 27 08/06/2020   Lab Results  Component Value Date   ALT 84 (H) 08/06/2020   AST 35 08/06/2020   ALKPHOS 65 08/06/2020   BILITOT 0.6 08/06/2020   Lab Results  Component Value Date   HGBA1C 6.7 (H) 08/06/2020   HGBA1C 5.8 (H) 01/03/2020   HGBA1C 6.1 (H) 09/13/2019   HGBA1C 6.1 (H) 05/15/2019   HGBA1C 5.6 09/26/2018   Lab Results  Component Value Date   INSULIN 185.0 (H) 08/06/2020   INSULIN 83.4 (H) 01/03/2020   INSULIN 48.4 (H) 09/13/2019   INSULIN 94.0 (H) 05/15/2019   INSULIN 76.8 (H) 09/26/2018   Lab Results  Component Value Date   TSH 2.200 09/26/2018   Lab Results  Component Value Date   CHOL 99 (L) 08/06/2020   HDL 36 (L) 08/06/2020   LDLCALC 37 08/06/2020   TRIG 154 (H) 08/06/2020   CHOLHDL  3.4 08/24/2017   Lab Results  Component Value Date   WBC 6.0 05/15/2019   HGB 17.3 05/15/2019   HCT 51.1 (H) 05/15/2019   MCV 81 05/15/2019   PLT 163 05/15/2019   No results found for: IRON, TIBC, FERRITIN  Obesity Behavioral Intervention:   Approximately 15 minutes were spent on the discussion below.  ASK: We discussed the diagnosis of obesity with Brandon [name] today and Brandon [name] agreed to give [place] permission to discuss obesity behavioral modification therapy today.  ASSESS: Brandon [name] has the diagnosis of obesity and his BMI today  is 46.84. Brandon [name] is in the action stage of change.   ADVISE: [name] was educated on the multiple health risks of obesity as well as the benefit of weight loss to improve his health. He was advised of the need for long term treatment and the importance of lifestyle modifications to improve his current health and to decrease his risk of future health problems.  AGREE: Multiple dietary modification options and treatment options were discussed and Brandon [name] agreed to follow the recommendations documented in the above note.  ARRANGE: Brandon [name] was educated on the importance of frequent visits to treat obesity as outlined per CMS and USPSTF guidelines and agreed to schedule his next follow up appointment today.  Attestation Statements:   Reviewed by clinician on day of visit: allergies, medications, problem list, medical history, surgical history, family history, social history, and previous encounter notes.   I, Burt Knack, am acting as transcriptionist for Quillian Quince, MD.  I have reviewed the above documentation for accuracy and completeness, and I agree with the above. -  Quillian Quince, MD

## 2020-10-04 DIAGNOSIS — L83 Acanthosis nigricans: Secondary | ICD-10-CM | POA: Diagnosis not present

## 2020-10-04 DIAGNOSIS — B353 Tinea pedis: Secondary | ICD-10-CM | POA: Diagnosis not present

## 2020-10-04 HISTORY — PX: COLONOSCOPY: SHX174

## 2020-10-11 ENCOUNTER — Encounter (INDEPENDENT_AMBULATORY_CARE_PROVIDER_SITE_OTHER): Payer: Self-pay | Admitting: Family Medicine

## 2020-10-24 ENCOUNTER — Ambulatory Visit (INDEPENDENT_AMBULATORY_CARE_PROVIDER_SITE_OTHER): Payer: 59 | Admitting: Family Medicine

## 2020-10-24 ENCOUNTER — Encounter (INDEPENDENT_AMBULATORY_CARE_PROVIDER_SITE_OTHER): Payer: Self-pay | Admitting: Family Medicine

## 2020-10-24 ENCOUNTER — Other Ambulatory Visit: Payer: Self-pay

## 2020-10-24 VITALS — BP 134/71 | HR 86 | Temp 98.5°F | Ht 68.0 in | Wt 303.0 lb

## 2020-10-24 DIAGNOSIS — E1169 Type 2 diabetes mellitus with other specified complication: Secondary | ICD-10-CM

## 2020-10-24 DIAGNOSIS — Z6841 Body Mass Index (BMI) 40.0 and over, adult: Secondary | ICD-10-CM

## 2020-10-26 ENCOUNTER — Other Ambulatory Visit (INDEPENDENT_AMBULATORY_CARE_PROVIDER_SITE_OTHER): Payer: Self-pay | Admitting: Family Medicine

## 2020-10-26 DIAGNOSIS — E559 Vitamin D deficiency, unspecified: Secondary | ICD-10-CM

## 2020-10-28 NOTE — Telephone Encounter (Signed)
Dr.Beasley 

## 2020-10-29 NOTE — Progress Notes (Signed)
Chief Complaint:   OBESITY Urbano is here to discuss his progress with his obesity treatment plan along with follow-up of his obesity related diagnoses. Bobbye is on practicing portion control and making smarter food choices, such as increasing vegetables and decreasing simple carbohydrates and states he is following his eating plan approximately 10% of the time. Mariah states he is doing 0 minutes 0 times per week.  Today's visit was #: 45 Starting weight: 305 lbs Starting date: 09/15/2017 Today's weight: 303 lbs Today's date: 10/24/2020 Total lbs lost to date: 2 Total lbs lost since last in-office visit: 5  Interim History: Samnang has been very busy getting ready to move and he has been more active. He is eating less but she is struggling to decrease sugar and simple carbohydrates.  Subjective:   1. Type 2 diabetes mellitus with other specified complication, without long-term current use of insulin (HCC) Eman is doing better with taking his Victoza more regularly. He is trying to cut back on sugar.  Assessment/Plan:   1. Type 2 diabetes mellitus with other specified complication, without long-term current use of insulin (HCC) Mosie will continue Victoza and metformin, and will continue to follow up as directed. Good blood sugar control is important to decrease the likelihood of diabetic complications such as nephropathy, neuropathy, limb loss, blindness, coronary artery disease, and death. Intensive lifestyle modification including diet, exercise and weight loss are the first line of treatment for diabetes.  2. Obesity with current BMI of 46.2 Arjun is currently in the action stage of change. As such, his goal is to continue with weight loss efforts. He has agreed to the Category 4 Plan or practicing portion control and making smarter food choices, such as increasing vegetables and decreasing simple carbohydrates.   Behavioral modification strategies: decreasing simple  carbohydrates, increasing water intake and decreasing liquid calories.  Jamin has agreed to follow-up with our clinic in 4 weeks. He was informed of the importance of frequent follow-up visits to maximize his success with intensive lifestyle modifications for his multiple health conditions.   Objective:   Blood pressure 134/71, pulse 86, temperature 98.5 F (36.9 C), height 5\' 8"  (1.727 m), weight (!) 303 lb (137.4 kg), SpO2 96 %. Body mass index is 46.07 kg/m.  General: Cooperative, alert, well developed, in no acute distress. HEENT: Conjunctivae and lids unremarkable. Cardiovascular: Regular rhythm.  Lungs: Normal work of breathing. Neurologic: No focal deficits.   Lab Results  Component Value Date   CREATININE 0.93 08/06/2020   BUN 13 08/06/2020   NA 143 08/06/2020   K 3.7 08/06/2020   CL 104 08/06/2020   CO2 27 08/06/2020   Lab Results  Component Value Date   ALT 84 (H) 08/06/2020   AST 35 08/06/2020   ALKPHOS 65 08/06/2020   BILITOT 0.6 08/06/2020   Lab Results  Component Value Date   HGBA1C 6.7 (H) 08/06/2020   HGBA1C 5.8 (H) 01/03/2020   HGBA1C 6.1 (H) 09/13/2019   HGBA1C 6.1 (H) 05/15/2019   HGBA1C 5.6 09/26/2018   Lab Results  Component Value Date   INSULIN 185.0 (H) 08/06/2020   INSULIN 83.4 (H) 01/03/2020   INSULIN 48.4 (H) 09/13/2019   INSULIN 94.0 (H) 05/15/2019   INSULIN 76.8 (H) 09/26/2018   Lab Results  Component Value Date   TSH 2.200 09/26/2018   Lab Results  Component Value Date   CHOL 99 (L) 08/06/2020   HDL 36 (L) 08/06/2020   LDLCALC 37 08/06/2020  TRIG 154 (H) 08/06/2020   CHOLHDL 3.4 08/24/2017   Lab Results  Component Value Date   WBC 6.0 05/15/2019   HGB 17.3 05/15/2019   HCT 51.1 (H) 05/15/2019   MCV 81 05/15/2019   PLT 163 05/15/2019   No results found for: IRON, TIBC, FERRITIN  Attestation Statements:   Reviewed by clinician on day of visit: allergies, medications, problem list, medical history, surgical history,  family history, social history, and previous encounter notes.  Time spent on visit including pre-visit chart review and post-visit care and charting was 32 minutes.    I, Burt Knack, am acting as transcriptionist for Quillian Quince, MD.  I have reviewed the above documentation for accuracy and completeness, and I agree with the above. -  Quillian Quince, MD

## 2020-10-30 NOTE — Telephone Encounter (Signed)
[email or web address]

## 2020-11-04 DIAGNOSIS — E1159 Type 2 diabetes mellitus with other circulatory complications: Secondary | ICD-10-CM | POA: Diagnosis not present

## 2020-11-04 DIAGNOSIS — I152 Hypertension secondary to endocrine disorders: Secondary | ICD-10-CM | POA: Diagnosis not present

## 2020-11-04 DIAGNOSIS — M79661 Pain in right lower leg: Secondary | ICD-10-CM | POA: Diagnosis not present

## 2020-11-20 ENCOUNTER — Ambulatory Visit (INDEPENDENT_AMBULATORY_CARE_PROVIDER_SITE_OTHER): Payer: 59 | Admitting: Family Medicine

## 2020-11-20 ENCOUNTER — Other Ambulatory Visit: Payer: Self-pay

## 2020-11-20 ENCOUNTER — Encounter (INDEPENDENT_AMBULATORY_CARE_PROVIDER_SITE_OTHER): Payer: Self-pay | Admitting: Family Medicine

## 2020-11-20 VITALS — BP 124/75 | HR 75 | Temp 98.4°F | Ht 68.0 in | Wt 301.0 lb

## 2020-11-20 DIAGNOSIS — E1169 Type 2 diabetes mellitus with other specified complication: Secondary | ICD-10-CM

## 2020-11-20 DIAGNOSIS — E559 Vitamin D deficiency, unspecified: Secondary | ICD-10-CM

## 2020-11-20 DIAGNOSIS — Z6841 Body Mass Index (BMI) 40.0 and over, adult: Secondary | ICD-10-CM | POA: Diagnosis not present

## 2020-11-20 MED ORDER — VITAMIN D (ERGOCALCIFEROL) 1.25 MG (50000 UNIT) PO CAPS: ORAL_CAPSULE | ORAL | 0 refills | Status: DC

## 2020-11-21 NOTE — Progress Notes (Signed)
Chief Complaint:   OBESITY Brandon [name] is here to discuss his progress with his obesity treatment plan along with follow-up of his obesity related diagnoses. Brandon [name] is on the Category 4 Plan or practicing portion control and making smarter food choices, such as increasing vegetables and decreasing simple carbohydrates and states he is following his eating plan approximately 50% of the time. Brandon [name] states he is walking the stairs for 20 minutes 4 times per week.  Today's visit was #: 46 Starting weight: 305 lbs Starting date: 09/15/2017 Today's weight: 301 lbs Today's date: 11/20/2020 Total lbs lost to date: 4 Total lbs lost since last in-office visit: 2  Interim History: Brandon [name] has done better with his weight loss efforts this month. He is in the process of moving his friend, and then he will be moving himself soon.  Subjective:   1. Type 2 diabetes mellitus with other specified complication, without long-term current use of insulin (HCC) Brandon [name] has been missing some doses of Victoza. He denies signs of hypoglycemia.  2. Vitamin D deficiency Brandon [name]'s Vit D level  Is not yet at goal. He is tolerating his Vit D prescription well.  Assessment/Plan:   1. Type 2 diabetes mellitus with other specified complication, without long-term current use of insulin (HCC) Brandon [name] will continue with weight loss efforts, and working on taking medications more regularly. Good blood sugar control is important to decrease the likelihood of diabetic complications such as nephropathy, neuropathy, limb loss, blindness, coronary artery disease, and death. Intensive lifestyle modification including diet, exercise and weight loss are the first line of treatment for diabetes.   2. Vitamin D deficiency Low Vitamin D level contributes to fatigue and are associated with obesity, breast, and colon cancer. We will refill prescription Vitamin D for 1 month, and we will recheck labs next month. Brandon [name] will  follow-up for routine testing of Vitamin D, at least 2-3 times per year to avoid over-replacement.  - Vitamin D, Ergocalciferol, (DRISDOL) 1.25 MG (50000 UNIT) CAPS capsule; TAKE 1 CAPSULE BY MOUTH EVERY 3 DAYS  Dispense: 10 capsule; Refill: 0  3. Obesity with current BMI 45.8 Brandon [name] is currently in the action stage of change. As such, his goal is to continue with weight loss efforts. He has agreed to following a lower carbohydrate, vegetable and lean protein rich diet plan.   Exercise goals: As is.  Behavioral modification strategies: planning for success.  Brandon [name] has agreed to follow-up with our clinic in 4 weeks. He was informed of the importance of frequent follow-up visits to maximize his success with intensive lifestyle modifications for his multiple health conditions.   Objective:   Blood pressure 124/75, pulse 75, temperature 98.4 F (36.9 C), height 5\' 8"  (1.727 m), weight (!) 301 lb (136.5 kg), SpO2 96 %. Body mass index is 45.77 kg/m.  General: Cooperative, alert, well developed, in no acute distress. HEENT: Conjunctivae and lids unremarkable. Cardiovascular: Regular rhythm.  Lungs: Normal work of breathing. Neurologic: No focal deficits.   Lab Results  Component Value Date   CREATININE 0.93 08/06/2020   BUN 13 08/06/2020   NA 143 08/06/2020   K 3.7 08/06/2020   CL 104 08/06/2020   CO2 27 08/06/2020   Lab Results  Component Value Date   ALT 84 (H) 08/06/2020   AST 35 08/06/2020   ALKPHOS 65 08/06/2020   BILITOT 0.6 08/06/2020   Lab Results  Component Value Date   HGBA1C 6.7 (H) 08/06/2020   HGBA1C 5.8 (H) 01/03/2020  HGBA1C 6.1 (H) 09/13/2019   HGBA1C 6.1 (H) 05/15/2019   HGBA1C 5.6 09/26/2018   Lab Results  Component Value Date   INSULIN 185.0 (H) 08/06/2020   INSULIN 83.4 (H) 01/03/2020   INSULIN 48.4 (H) 09/13/2019   INSULIN 94.0 (H) 05/15/2019   INSULIN 76.8 (H) 09/26/2018   Lab Results  Component Value Date   TSH 2.200 09/26/2018   Lab  Results  Component Value Date   CHOL 99 (L) 08/06/2020   HDL 36 (L) 08/06/2020   LDLCALC 37 08/06/2020   TRIG 154 (H) 08/06/2020   CHOLHDL 3.4 08/24/2017   Lab Results  Component Value Date   WBC 6.0 05/15/2019   HGB 17.3 05/15/2019   HCT 51.1 (H) 05/15/2019   MCV 81 05/15/2019   PLT 163 05/15/2019   No results found for: IRON, TIBC, FERRITIN  Obesity Behavioral Intervention:   Approximately 15 minutes were spent on the discussion below.  ASK: We discussed the diagnosis of obesity with Brandon Needle today and Brandon [name] agreed to give [place] permission to discuss obesity behavioral modification therapy today.  ASSESS: Brandon [name] has the diagnosis of obesity and his BMI today is 45.78. Brandon [name] is in the action stage of change.   ADVISE: Brandon [name] was educated on the multiple health risks of obesity as well as the benefit of weight loss to improve his health. He was advised of the need for long term treatment and the importance of lifestyle modifications to improve his current health and to decrease his risk of future health problems.  AGREE: Multiple dietary modification options and treatment options were discussed and Brandon [name] agreed to follow the recommendations documented in the above note.  ARRANGE: Brandon [name] was educated on the importance of frequent visits to treat obesity as outlined per CMS and USPSTF guidelines and agreed to schedule his next follow up appointment today.  Attestation Statements:   Reviewed by clinician on day of visit: allergies, medications, problem list, medical history, surgical history, family history, social history, and previous encounter notes.   I, Burt Knack, am acting as transcriptionist for Quillian Quince, MD.  I have reviewed the above documentation for accuracy and completeness, and I agree with the above. -  Quillian Quince, MD

## 2020-11-22 ENCOUNTER — Other Ambulatory Visit (INDEPENDENT_AMBULATORY_CARE_PROVIDER_SITE_OTHER): Payer: Self-pay | Admitting: Family Medicine

## 2020-11-22 DIAGNOSIS — E559 Vitamin D deficiency, unspecified: Secondary | ICD-10-CM

## 2020-11-26 DIAGNOSIS — M67962 Unspecified disorder of synovium and tendon, left lower leg: Secondary | ICD-10-CM | POA: Diagnosis not present

## 2020-11-26 DIAGNOSIS — M25462 Effusion, left knee: Secondary | ICD-10-CM | POA: Diagnosis not present

## 2020-12-25 ENCOUNTER — Ambulatory Visit (INDEPENDENT_AMBULATORY_CARE_PROVIDER_SITE_OTHER): Payer: 59 | Admitting: Family Medicine

## 2020-12-25 ENCOUNTER — Other Ambulatory Visit: Payer: Self-pay

## 2020-12-25 ENCOUNTER — Encounter (INDEPENDENT_AMBULATORY_CARE_PROVIDER_SITE_OTHER): Payer: Self-pay | Admitting: Family Medicine

## 2020-12-25 VITALS — BP 144/91 | HR 87 | Temp 98.4°F | Ht 68.0 in | Wt 296.0 lb

## 2020-12-25 DIAGNOSIS — E559 Vitamin D deficiency, unspecified: Secondary | ICD-10-CM

## 2020-12-25 DIAGNOSIS — Z6841 Body Mass Index (BMI) 40.0 and over, adult: Secondary | ICD-10-CM | POA: Diagnosis not present

## 2020-12-25 DIAGNOSIS — E785 Hyperlipidemia, unspecified: Secondary | ICD-10-CM

## 2020-12-25 DIAGNOSIS — E1169 Type 2 diabetes mellitus with other specified complication: Secondary | ICD-10-CM

## 2020-12-25 MED ORDER — VITAMIN D (ERGOCALCIFEROL) 1.25 MG (50000 UNIT) PO CAPS: ORAL_CAPSULE | ORAL | 0 refills | Status: DC

## 2020-12-26 LAB — VITAMIN D 25 HYDROXY (VIT D DEFICIENCY, FRACTURES): Vit D, 25-Hydroxy: 36.6 ng/mL (ref 30.0–100.0)

## 2020-12-26 LAB — CMP14+EGFR
ALT: 123 IU/L — ABNORMAL HIGH (ref 0–44)
AST: 70 IU/L — ABNORMAL HIGH (ref 0–40)
Albumin/Globulin Ratio: 2.2 (ref 1.2–2.2)
Albumin: 4.8 g/dL (ref 4.0–5.0)
Alkaline Phosphatase: 60 IU/L (ref 44–121)
BUN/Creatinine Ratio: 21 — ABNORMAL HIGH (ref 9–20)
BUN: 19 mg/dL (ref 6–24)
Bilirubin Total: 0.8 mg/dL (ref 0.0–1.2)
CO2: 24 mmol/L (ref 20–29)
Calcium: 9.7 mg/dL (ref 8.7–10.2)
Chloride: 99 mmol/L (ref 96–106)
Creatinine, Ser: 0.89 mg/dL (ref 0.76–1.27)
Globulin, Total: 2.2 g/dL (ref 1.5–4.5)
Glucose: 123 mg/dL — ABNORMAL HIGH (ref 65–99)
Potassium: 2.7 mmol/L — ABNORMAL LOW (ref 3.5–5.2)
Sodium: 144 mmol/L (ref 134–144)
Total Protein: 7 g/dL (ref 6.0–8.5)
eGFR: 105 mL/min/{1.73_m2} (ref >59)

## 2020-12-26 LAB — LIPID PANEL WITH LDL/HDL RATIO
Cholesterol, Total: 145 mg/dL (ref 100–199)
HDL: 31 mg/dL — ABNORMAL LOW (ref >39)
LDL Chol Calc (NIH): 43 mg/dL (ref 0–99)
LDL/HDL Ratio: 1.4 ratio (ref 0.0–3.6)
Triglycerides: 492 mg/dL — ABNORMAL HIGH (ref 0–149)
VLDL Cholesterol Cal: 71 mg/dL — ABNORMAL HIGH (ref 5–40)

## 2020-12-26 LAB — HEMOGLOBIN A1C
Est. average glucose Bld gHb Est-mCnc: 171 mg/dL
Hgb A1c MFr Bld: 7.6 % — ABNORMAL HIGH (ref 4.8–5.6)

## 2020-12-30 NOTE — Progress Notes (Signed)
Chief Complaint:   OBESITY Brandon [name] is here to discuss his progress with his obesity treatment plan along with follow-up of his obesity related diagnoses. Brandon [name] is on following a lower carbohydrate, vegetable and lean protein rich diet plan and states he is following his eating plan approximately 50% of the time. Brandon [name] states he is walking for 10 minutes 3 times per week.  Today's visit was #: 26 Starting weight: 305 lbs Starting date: 09/15/2017 Today's weight: 296 lbs Today's date: 12/25/2020 Total lbs lost to date: 9 Total lbs lost since last in-office visit: 5  Interim History: Brandon [name] has done well with weight loss. He recently moved and he has been more active he is not able to follow a structured plan closely, but he is mindful of his choices and continues to try to portion control and make smarter choices.  Subjective:   1. Type 2 diabetes mellitus with other specified complication, without long-term current use of insulin (HCC) Brandon [name] continues to work on diet and weight loss. He has not been taking his Victoza and he did not bring his BGs log today.  2. Hyperlipidemia associated with type 2 diabetes mellitus ([place]) Brandon [name] is working on diet and he is stable on Lipitor. He is due for labs.  3. Vitamin D deficiency Brandon [name] is on Vit D, and he is due to have his labs checked.  Assessment/Plan:   1. Type 2 diabetes mellitus with other specified complication, without long-term current use of insulin (HCC) We will check labs today. Brandon [name] is to restart Victoza. Good blood sugar control is important to decrease the likelihood of diabetic complications such as nephropathy, neuropathy, limb loss, blindness, coronary artery disease, and death. Intensive lifestyle modification including diet, exercise and weight loss are the first line of treatment for diabetes.   - CMP14+EGFR - Hemoglobin A1c  2. Hyperlipidemia associated with type 2 diabetes mellitus  ([place]) Cardiovascular risk and specific lipid/LDL goals reviewed. We discussed several lifestyle modifications today. We will check labs today. Brandon [name] will continue to work on diet, exercise and weight loss efforts. Orders and follow up as documented in patient record.   - Lipid Panel With LDL/HDL Ratio  3. Vitamin D deficiency Low Vitamin D level contributes to fatigue and are associated with obesity, breast, and colon cancer. We will check labs today, and we will refill prescription Vitamin D for 1 month. Brandon [name] will follow-up for routine testing of Vitamin D, at least 2-3 times per year to avoid over-replacement.  - Vitamin D, Ergocalciferol, (DRISDOL) 1.25 MG (50000 UNIT) CAPS capsule; TAKE 1 CAPSULE BY MOUTH EVERY 3 DAYS  Dispense: 10 capsule; Refill: 0 - VITAMIN D 25 Hydroxy (Vit-D Deficiency, Fractures)  4. Obesity with current BMI 45.0 Brandon [name] is currently in the action stage of change. As such, his goal is to continue with weight loss efforts. He has agreed to following a lower carbohydrate, vegetable and lean protein rich diet plan.   Exercise goals: As is.  Behavioral modification strategies: increasing lean protein intake.  Brandon [name] has agreed to follow-up with our clinic in 4 weeks. He was informed of the importance of frequent follow-up visits to maximize his success with intensive lifestyle modifications for his multiple health conditions.   Brandon [name] was informed we would discuss his lab results at his next visit unless there is a critical issue that needs to be addressed sooner. Brandon [name] agreed to keep his next visit at the agreed upon time to discuss these results.  Objective:  Blood pressure (!) 144/91, pulse 87, temperature 98.4 F (36.9 C), height 5' 8"  (1.727 m), weight 296 lb (134.3 kg), SpO2 96 %. Body mass index is 45.01 kg/m.  General: Cooperative, alert, well developed, in no acute distress. HEENT: Conjunctivae and lids unremarkable. Cardiovascular: Regular  rhythm.  Lungs: Normal work of breathing. Neurologic: No focal deficits.   Lab Results  Component Value Date   CREATININE 0.89 12/25/2020   BUN 19 12/25/2020   NA 144 12/25/2020   K 2.7 (L) 12/25/2020   CL 99 12/25/2020   CO2 24 12/25/2020   Lab Results  Component Value Date   ALT 123 (H) 12/25/2020   AST 70 (H) 12/25/2020   ALKPHOS 60 12/25/2020   BILITOT 0.8 12/25/2020   Lab Results  Component Value Date   HGBA1C 7.6 (H) 12/25/2020   HGBA1C 6.7 (H) 08/06/2020   HGBA1C 5.8 (H) 01/03/2020   HGBA1C 6.1 (H) 09/13/2019   HGBA1C 6.1 (H) 05/15/2019   Lab Results  Component Value Date   INSULIN 185.0 (H) 08/06/2020   INSULIN 83.4 (H) 01/03/2020   INSULIN 48.4 (H) 09/13/2019   INSULIN 94.0 (H) 05/15/2019   INSULIN 76.8 (H) 09/26/2018   Lab Results  Component Value Date   TSH 2.200 09/26/2018   Lab Results  Component Value Date   CHOL 145 12/25/2020   HDL 31 (L) 12/25/2020   LDLCALC 43 12/25/2020   TRIG 492 (H) 12/25/2020   CHOLHDL 3.4 08/24/2017   Lab Results  Component Value Date   WBC 6.0 05/15/2019   HGB 17.3 05/15/2019   HCT 51.1 (H) 05/15/2019   MCV 81 05/15/2019   PLT 163 05/15/2019   No results found for: IRON, TIBC, FERRITIN  Obesity Behavioral Intervention:   Approximately 15 minutes were spent on the discussion below.  ASK: We discussed the diagnosis of obesity with Brandon [name] today and Brandon [name] agreed to give [place] permission to discuss obesity behavioral modification therapy today.  ASSESS: Brandon [name] has the diagnosis of obesity and his BMI today is 45.02. Brandon [name] is in the action stage of change.   ADVISE: Brandon [name] was educated on the multiple health risks of obesity as well as the benefit of weight loss to improve his health. He was advised of the need for long term treatment and the importance of lifestyle modifications to improve his current health and to decrease his risk of future health problems.  AGREE: Multiple dietary modification options  and treatment options were discussed and Brandon [name] agreed to follow the recommendations documented in the above note.  ARRANGE: Brandon [name] was educated on the importance of frequent visits to treat obesity as outlined per CMS and USPSTF guidelines and agreed to schedule his next follow up appointment today.  Attestation Statements:   Reviewed by clinician on day of visit: allergies, medications, problem list, medical history, surgical history, family history, social history, and previous encounter notes.   I, Trixie Dredge, am acting as transcriptionist for Dennard Nip, MD.  I have reviewed the above documentation for accuracy and completeness, and I agree with the above. -  Dennard Nip, MD

## 2021-01-27 ENCOUNTER — Encounter (INDEPENDENT_AMBULATORY_CARE_PROVIDER_SITE_OTHER): Payer: Self-pay | Admitting: Family Medicine

## 2021-01-27 ENCOUNTER — Ambulatory Visit (INDEPENDENT_AMBULATORY_CARE_PROVIDER_SITE_OTHER): Payer: 59 | Admitting: Family Medicine

## 2021-01-27 ENCOUNTER — Other Ambulatory Visit: Payer: Self-pay

## 2021-01-27 VITALS — BP 140/82 | HR 72 | Temp 97.4°F | Ht 68.0 in | Wt 306.0 lb

## 2021-01-27 DIAGNOSIS — E66813 Obesity, class 3: Secondary | ICD-10-CM

## 2021-01-27 DIAGNOSIS — E781 Pure hyperglyceridemia: Secondary | ICD-10-CM

## 2021-01-27 DIAGNOSIS — Z6841 Body Mass Index (BMI) 40.0 and over, adult: Secondary | ICD-10-CM | POA: Diagnosis not present

## 2021-01-27 DIAGNOSIS — E1169 Type 2 diabetes mellitus with other specified complication: Secondary | ICD-10-CM | POA: Diagnosis not present

## 2021-01-27 DIAGNOSIS — I10 Essential (primary) hypertension: Secondary | ICD-10-CM

## 2021-01-28 LAB — CMP14+EGFR
ALT: 59 IU/L — ABNORMAL HIGH (ref 0–44)
AST: 39 IU/L (ref 0–40)
Albumin/Globulin Ratio: 1.8 (ref 1.2–2.2)
Albumin: 4.3 g/dL (ref 4.0–5.0)
Alkaline Phosphatase: 68 IU/L (ref 44–121)
BUN/Creatinine Ratio: 14 (ref 9–20)
BUN: 11 mg/dL (ref 6–24)
Bilirubin Total: 0.6 mg/dL (ref 0.0–1.2)
CO2: 26 mmol/L (ref 20–29)
Calcium: 9.4 mg/dL (ref 8.7–10.2)
Chloride: 105 mmol/L (ref 96–106)
Creatinine, Ser: 0.76 mg/dL (ref 0.76–1.27)
Globulin, Total: 2.4 g/dL (ref 1.5–4.5)
Glucose: 84 mg/dL (ref 65–99)
Potassium: 3.7 mmol/L (ref 3.5–5.2)
Sodium: 145 mmol/L — ABNORMAL HIGH (ref 134–144)
Total Protein: 6.7 g/dL (ref 6.0–8.5)
eGFR: 110 mL/min/{1.73_m2} (ref >59)

## 2021-01-28 LAB — LIPID PANEL WITH LDL/HDL RATIO
Cholesterol, Total: 115 mg/dL (ref 100–199)
HDL: 33 mg/dL — ABNORMAL LOW (ref >39)
LDL Chol Calc (NIH): 40 mg/dL (ref 0–99)
LDL/HDL Ratio: 1.2 ratio (ref 0.0–3.6)
Triglycerides: 277 mg/dL — ABNORMAL HIGH (ref 0–149)
VLDL Cholesterol Cal: 42 mg/dL — ABNORMAL HIGH (ref 5–40)

## 2021-01-31 NOTE — Progress Notes (Signed)
Chief Complaint:   OBESITY Brandon [name] is here to discuss his progress with his obesity treatment plan along with follow-up of his obesity related diagnoses. Brandon [name] is on following a lower carbohydrate, vegetable and lean protein rich diet plan and states he is following his eating plan approximately 40% of the time. Brandon [name] states he is walking for 10 minutes 5 times per week.  Today's visit was #: 46 Starting weight: 305 lbs Starting date: 09/15/2017 Today's weight: 306 lbs Today's date: 01/27/2021 Total lbs lost to date: 0 Total lbs lost since last in-office visit: 0  Interim History: Brandon [name] has increased stress and traveling which has made following his eating plan extra difficult. He has made some less ideal choices with increased carbohydrates and increased calories. He is also retaining some water weight as well.  Subjective:   1. Type 2 diabetes mellitus with other specified complication, without long-term current use of insulin (HCC) Brandon [name]'s last A1c has increased to 7.6. He has noticed increased urination. He has stopped taking his Victoza due to forgetting to take it.  2. Essential hypertension Brandon [name]'s blood pressure is elevated today, but it normally better controlled.  3. Hypertriglyceridemia Brandon [name]'s last triglycerides were elevated on his last labs, but he wasn't fasting. He is fasting today.  Assessment/Plan:   1. Type 2 diabetes mellitus with other specified complication, without long-term current use of insulin ([place]) Brandon [name] agreed to restart Victoza as soon as possible, and he will continue metformin. We will recheck labs in 3 months. But will repeat fasting labs today. Good blood sugar control is important to decrease the likelihood of diabetic complications such as nephropathy, neuropathy, limb loss, blindness, coronary artery disease, and death. Intensive lifestyle modification including diet, exercise and weight loss are the first line of treatment for  diabetes.   2. Essential hypertension Brandon [name] is to decrease Na+ and increase his water intake. He will continue working on healthy weight loss and exercise to improve blood pressure control. We will recheck his blood pressure in 1 month as he continues his lifestyle modifications.  3. Hypertriglyceridemia Cardiovascular risk and specific lipid/LDL goals reviewed. We discussed several lifestyle modifications today. We will repeat fasting labs today. Brandon [name] will continue to work on diet, exercise and weight loss efforts. Orders and follow up as documented in patient record.   - CMP14+EGFR - Lipid Panel With LDL/HDL Ratio  4. Obesity with current BMI 46.6 Brandon [name] is currently in the action stage of change. As such, his goal is to continue with weight loss efforts. He has agreed to following a lower carbohydrate, vegetable and lean protein rich diet plan.   Exercise goals: As is.  Behavioral modification strategies: decreasing simple carbohydrates, increasing water intake, and decreasing liquid calories.  Brandon [name] has agreed to follow-up with our clinic in 4 weeks. He was informed of the importance of frequent follow-up visits to maximize his success with intensive lifestyle modifications for his multiple health conditions.   Objective:   Blood pressure 140/82, pulse 72, temperature (!) 97.4 F (36.3 C), height 5' 8"  (1.727 m), weight (!) 306 lb (138.8 kg), SpO2 97 %. Body mass index is 46.53 kg/m.  General: Cooperative, alert, well developed, in no acute distress. HEENT: Conjunctivae and lids unremarkable. Cardiovascular: Regular rhythm.  Lungs: Normal work of breathing. Neurologic: No focal deficits.   Lab Results  Component Value Date   CREATININE 0.76 01/27/2021   BUN 11 01/27/2021   NA 145 (H) 01/27/2021   K 3.7 01/27/2021  CL 105 01/27/2021   CO2 26 01/27/2021   Lab Results  Component Value Date   ALT 59 (H) 01/27/2021   AST 39 01/27/2021   ALKPHOS 68 01/27/2021    BILITOT 0.6 01/27/2021   Lab Results  Component Value Date   HGBA1C 7.6 (H) 12/25/2020   HGBA1C 6.7 (H) 08/06/2020   HGBA1C 5.8 (H) 01/03/2020   HGBA1C 6.1 (H) 09/13/2019   HGBA1C 6.1 (H) 05/15/2019   Lab Results  Component Value Date   INSULIN 185.0 (H) 08/06/2020   INSULIN 83.4 (H) 01/03/2020   INSULIN 48.4 (H) 09/13/2019   INSULIN 94.0 (H) 05/15/2019   INSULIN 76.8 (H) 09/26/2018   Lab Results  Component Value Date   TSH 2.200 09/26/2018   Lab Results  Component Value Date   CHOL 115 01/27/2021   HDL 33 (L) 01/27/2021   LDLCALC 40 01/27/2021   TRIG 277 (H) 01/27/2021   CHOLHDL 3.4 08/24/2017   Lab Results  Component Value Date   VD25OH 36.6 12/25/2020   VD25OH 38.1 08/06/2020   VD25OH 25.0 (L) 01/03/2020   Lab Results  Component Value Date   WBC 6.0 05/15/2019   HGB 17.3 05/15/2019   HCT 51.1 (H) 05/15/2019   MCV 81 05/15/2019   PLT 163 05/15/2019   No results found for: IRON, TIBC, FERRITIN  Obesity Behavioral Intervention:   Approximately 15 minutes were spent on the discussion below.  ASK: We discussed the diagnosis of obesity with Brandon [name] today and Brandon [name] agreed to give [place] permission to discuss obesity behavioral modification therapy today.  ASSESS: Brandon [name] has the diagnosis of obesity and his BMI today is 46.54. Brysten is in the action stage of change.   ADVISE: Brandon [name] was educated on the multiple health risks of obesity as well as the benefit of weight loss to improve his health. He was advised of the need for long term treatment and the importance of lifestyle modifications to improve his current health and to decrease his risk of future health problems.  AGREE: Multiple dietary modification options and treatment options were discussed and Brandon [name] agreed to follow the recommendations documented in the above note.  ARRANGE: Brandon [name] was educated on the importance of frequent visits to treat obesity as outlined per CMS and USPSTF guidelines and  agreed to schedule his next follow up appointment today.  Attestation Statements:   Reviewed by clinician on day of visit: allergies, medications, problem list, medical history, surgical history, family history, social history, and previous encounter notes.   I, Trixie Dredge, am acting as transcriptionist for Dennard Nip, MD.  I have reviewed the above documentation for accuracy and completeness, and I agree with the above. -  Dennard Nip, MD

## 2021-02-04 DIAGNOSIS — M542 Cervicalgia: Secondary | ICD-10-CM | POA: Diagnosis not present

## 2021-02-04 DIAGNOSIS — E1159 Type 2 diabetes mellitus with other circulatory complications: Secondary | ICD-10-CM | POA: Diagnosis not present

## 2021-02-04 DIAGNOSIS — E785 Hyperlipidemia, unspecified: Secondary | ICD-10-CM | POA: Diagnosis not present

## 2021-02-04 DIAGNOSIS — I739 Peripheral vascular disease, unspecified: Secondary | ICD-10-CM | POA: Diagnosis not present

## 2021-02-04 DIAGNOSIS — E1169 Type 2 diabetes mellitus with other specified complication: Secondary | ICD-10-CM | POA: Diagnosis not present

## 2021-02-04 DIAGNOSIS — E1142 Type 2 diabetes mellitus with diabetic polyneuropathy: Secondary | ICD-10-CM | POA: Diagnosis not present

## 2021-02-04 DIAGNOSIS — I152 Hypertension secondary to endocrine disorders: Secondary | ICD-10-CM | POA: Diagnosis not present

## 2021-02-04 DIAGNOSIS — E7849 Other hyperlipidemia: Secondary | ICD-10-CM | POA: Diagnosis not present

## 2021-02-04 DIAGNOSIS — G8929 Other chronic pain: Secondary | ICD-10-CM | POA: Diagnosis not present

## 2021-02-04 DIAGNOSIS — G6289 Other specified polyneuropathies: Secondary | ICD-10-CM | POA: Diagnosis not present

## 2021-02-22 DIAGNOSIS — Z23 Encounter for immunization: Secondary | ICD-10-CM | POA: Diagnosis not present

## 2021-02-25 ENCOUNTER — Ambulatory Visit (INDEPENDENT_AMBULATORY_CARE_PROVIDER_SITE_OTHER): Payer: 59 | Admitting: Family Medicine

## 2021-02-25 ENCOUNTER — Other Ambulatory Visit: Payer: Self-pay

## 2021-02-25 ENCOUNTER — Encounter (INDEPENDENT_AMBULATORY_CARE_PROVIDER_SITE_OTHER): Payer: Self-pay | Admitting: Family Medicine

## 2021-02-25 DIAGNOSIS — E559 Vitamin D deficiency, unspecified: Secondary | ICD-10-CM | POA: Diagnosis not present

## 2021-02-25 DIAGNOSIS — E1169 Type 2 diabetes mellitus with other specified complication: Secondary | ICD-10-CM

## 2021-02-25 DIAGNOSIS — Z6841 Body Mass Index (BMI) 40.0 and over, adult: Secondary | ICD-10-CM

## 2021-02-25 MED ORDER — BD PEN NEEDLE NANO U/F 32G X 4 MM MISC: 1.0000 | Freq: Two times a day (BID) | 0 refills | Status: DC

## 2021-02-25 MED ORDER — VICTOZA 18 MG/3ML ~~LOC~~ SOPN: 1.2000 mg | PEN_INJECTOR | SUBCUTANEOUS | 0 refills | Status: DC

## 2021-02-26 NOTE — Progress Notes (Signed)
Chief Complaint:   OBESITY Brandon [name] is here to discuss his progress with his obesity treatment plan along with follow-up of his obesity related diagnoses. Brandon [name] is on following a lower carbohydrate, vegetable and lean protein rich diet plan and states he is following his eating plan approximately 30% of the time. Brandon [name] states he is walking for 10 minutes 5 times per week.  Today's visit was #: 49 Starting weight: 305 lbs Starting date: 09/15/2017 Today's weight: 296 lbs Today's date: 02/25/2021 Total lbs lost to date: 9 Total lbs lost since last in-office visit: 10  Interim History: Brandon [name] has done well with weight loss. He is dealing with some family stress, but things are getting better with his family members.  Subjective:   1. Type 2 diabetes mellitus with other specified complication, without long-term current use of insulin (HCC) Brandon [name] is on Victoza, and he is working on diet and weight loss again. He is trying to decrease sugary drinks.  2. Vitamin D deficiency Brandon [name] is stable on Vit D, but his level is not yet at goal.  Assessment/Plan:   1. Type 2 diabetes mellitus with other specified complication, without long-term current use of insulin (HCC) Brandon [name] will continue his medications, and we will refill Victoza for 1 month, and nano needles #100 with no refills. Good blood sugar control is important to decrease the likelihood of diabetic complications such as nephropathy, neuropathy, limb loss, blindness, coronary artery disease, and death. Intensive lifestyle modification including diet, exercise and weight loss are the first line of treatment for diabetes.   - liraglutide (VICTOZA) 18 MG/3ML SOPN; Inject 1.2 mg into the skin every morning.  Dispense: 6 mL; Refill: 0 - Insulin Pen Needle (BD PEN NEEDLE NANO U/F) 32G X 4 MM MISC; 1 Package by Does not apply route 2 (two) times daily.  Dispense: 100 each; Refill: 0  2. Vitamin D deficiency Low Vitamin D level  contributes to fatigue and are associated with obesity, breast, and colon cancer. Brandon [name] agreed to continue prescription Vitamin D 50,000 IU every 3 days and we will recheck labs in 2 months. He will follow-up for routine testing of Vitamin D, at least 2-3 times per year to avoid over-replacement.  3. Obesity with current BMI 45.1 Brandon [name] is currently in the action stage of change. As such, his goal is to continue with weight loss efforts. He has agreed to the Category 4 Plan or following a lower carbohydrate, vegetable and lean protein rich diet plan.   Exercise goals: As is.  Behavioral modification strategies: increasing lean protein intake.  Brandon [name] has agreed to follow-up with our clinic in 3 to 4 weeks. He was informed of the importance of frequent follow-up visits to maximize his success with intensive lifestyle modifications for his multiple health conditions.   Objective:   Blood pressure 140/74, pulse 82, temperature 98.2 F (36.8 C), height 5\' 8"  (1.727 m), weight 296 lb (134.3 kg), SpO2 97 %. Body mass index is 45.01 kg/m.  General: Cooperative, alert, well developed, in no acute distress. HEENT: Conjunctivae and lids unremarkable. Cardiovascular: Regular rhythm.  Lungs: Normal work of breathing. Neurologic: No focal deficits.   Lab Results  Component Value Date   CREATININE 0.76 01/27/2021   BUN 11 01/27/2021   NA 145 (H) 01/27/2021   K 3.7 01/27/2021   CL 105 01/27/2021   CO2 26 01/27/2021   Lab Results  Component Value Date   ALT 59 (H) 01/27/2021   AST 39 01/27/2021  ALKPHOS 68 01/27/2021   BILITOT 0.6 01/27/2021   Lab Results  Component Value Date   HGBA1C 7.6 (H) 12/25/2020   HGBA1C 6.7 (H) 08/06/2020   HGBA1C 5.8 (H) 01/03/2020   HGBA1C 6.1 (H) 09/13/2019   HGBA1C 6.1 (H) 05/15/2019   Lab Results  Component Value Date   INSULIN 185.0 (H) 08/06/2020   INSULIN 83.4 (H) 01/03/2020   INSULIN 48.4 (H) 09/13/2019   INSULIN 94.0 (H) 05/15/2019    INSULIN 76.8 (H) 09/26/2018   Lab Results  Component Value Date   TSH 2.200 09/26/2018   Lab Results  Component Value Date   CHOL 115 01/27/2021   HDL 33 (L) 01/27/2021   LDLCALC 40 01/27/2021   TRIG 277 (H) 01/27/2021   CHOLHDL 3.4 08/24/2017   Lab Results  Component Value Date   VD25OH 36.6 12/25/2020   VD25OH 38.1 08/06/2020   VD25OH 25.0 (L) 01/03/2020   Lab Results  Component Value Date   WBC 6.0 05/15/2019   HGB 17.3 05/15/2019   HCT 51.1 (H) 05/15/2019   MCV 81 05/15/2019   PLT 163 05/15/2019   No results found for: IRON, TIBC, FERRITIN  Obesity Behavioral Intervention:   Approximately 15 minutes were spent on the discussion below.  ASK: We discussed the diagnosis of obesity with [name] today and [name] agreed to give [place] permission to discuss obesity behavioral modification therapy today.  ASSESS: [name] has the diagnosis of obesity and his BMI today is 45.1. [name] is in the action stage of change.   ADVISE: [name] was educated on the multiple health risks of obesity as well as the benefit of weight loss to improve his health. He was advised of the need for long term treatment and the importance of lifestyle modifications to improve his current health and to decrease his risk of future health problems.  AGREE: Multiple dietary modification options and treatment options were discussed and [name] agreed to follow the recommendations documented in the above note.  ARRANGE: [name] was educated on the importance of frequent visits to treat obesity as outlined per CMS and USPSTF guidelines and agreed to schedule his next follow up appointment today.  Attestation Statements:   Reviewed by clinician on day of visit: allergies, medications, problem list, medical history, surgical history, family history, social history, and previous encounter notes.   I, Burt Knack, am acting as transcriptionist for Quillian Quince, MD.  I have reviewed the above  documentation for accuracy and completeness, and I agree with the above. -  Quillian Quince, MD

## 2021-03-20 ENCOUNTER — Ambulatory Visit (INDEPENDENT_AMBULATORY_CARE_PROVIDER_SITE_OTHER): Payer: Medicare Other | Admitting: Family Medicine

## 2021-03-31 ENCOUNTER — Encounter (INDEPENDENT_AMBULATORY_CARE_PROVIDER_SITE_OTHER): Payer: Self-pay | Admitting: Family Medicine

## 2021-03-31 ENCOUNTER — Ambulatory Visit (INDEPENDENT_AMBULATORY_CARE_PROVIDER_SITE_OTHER): Payer: 59 | Admitting: Family Medicine

## 2021-03-31 ENCOUNTER — Other Ambulatory Visit: Payer: Self-pay

## 2021-03-31 VITALS — BP 123/78 | HR 83 | Temp 98.3°F | Ht 68.0 in | Wt 294.0 lb

## 2021-03-31 DIAGNOSIS — Z6841 Body Mass Index (BMI) 40.0 and over, adult: Secondary | ICD-10-CM | POA: Diagnosis not present

## 2021-03-31 DIAGNOSIS — E1169 Type 2 diabetes mellitus with other specified complication: Secondary | ICD-10-CM | POA: Diagnosis not present

## 2021-03-31 DIAGNOSIS — E559 Vitamin D deficiency, unspecified: Secondary | ICD-10-CM | POA: Diagnosis not present

## 2021-03-31 MED ORDER — BD PEN NEEDLE NANO U/F 32G X 4 MM MISC: 1.0000 | Freq: Two times a day (BID) | 0 refills | Status: AC

## 2021-03-31 MED ORDER — VICTOZA 18 MG/3ML ~~LOC~~ SOPN: 1.2000 mg | PEN_INJECTOR | SUBCUTANEOUS | 0 refills | Status: DC

## 2021-03-31 MED ORDER — VITAMIN D (ERGOCALCIFEROL) 1.25 MG (50000 UNIT) PO CAPS: ORAL_CAPSULE | ORAL | 0 refills | Status: DC

## 2021-03-31 NOTE — Progress Notes (Signed)
Chief Complaint:   OBESITY Brandon [name] is here to discuss his progress with his obesity treatment plan along with follow-up of his obesity related diagnoses. Brandon [name] is on the Category 4 Plan or following a lower carbohydrate, vegetable and lean protein rich diet plan and states he is following his eating plan approximately 40% of the time. [name] states he is walking for 5 minutes 7 times per week.  Today's visit was #: 50 Starting weight: 305 lbs Starting date: 09/15/2017 Today's weight: 294 lbs Today's date: 03/31/2021 Total lbs lost to date: 11 Total lbs lost since last in-office visit: 2  Interim History: Brandon [name] continues to do well with weight loss on his plan. He had a GI bug which affected his eating for a couple of days, but he is feeling better now.  Subjective:   1. Vitamin D deficiency Brandon [name]'s Vit D level is not yet at goal. He needs a refill of Vit D.  2. Type 2 diabetes mellitus with other specified complication, without long-term current use of insulin (HCC) Brandon [name] is on and off taking Victoza, but he is doing better remembering to take it overall.  Assessment/Plan:   1. Vitamin D deficiency Low Vitamin D level contributes to fatigue and are associated with obesity, breast, and colon cancer. We will refill prescription Vitamin D for 1 month. [name] will follow-up for routine testing of Vitamin D, at least 2-3 times per year to avoid over-replacement.  - Vitamin D, Ergocalciferol, (DRISDOL) 1.25 MG (50000 UNIT) CAPS capsule; TAKE 1 CAPSULE BY MOUTH EVERY 3 DAYS  Dispense: 10 capsule; Refill: 0  2. Type 2 diabetes mellitus with other specified complication, without long-term current use of insulin (HCC) [name] will continue his medications, and we will refill Victoza for 1 month, and nano needles #100 with no refills. Good blood sugar control is important to decrease the likelihood of diabetic complications such as nephropathy, neuropathy, limb loss, blindness,  coronary artery disease, and death. Intensive lifestyle modification including diet, exercise and weight loss are the first line of treatment for diabetes.   - liraglutide (VICTOZA) 18 MG/3ML SOPN; Inject 1.2 mg into the skin every morning.  Dispense: 6 mL; Refill: 0 - Insulin Pen Needle (BD PEN NEEDLE NANO U/F) 32G X 4 MM MISC; 1 Package by Does not apply route 2 (two) times daily.  Dispense: 100 each; Refill: 0  3. Obesity with current BMI 44.8 Brandon [name] is currently in the action stage of change. As such, his goal is to continue with weight loss efforts. He has agreed to the Category 4 Plan.   Exercise goals: As is.  Behavioral modification strategies: increasing lean protein intake.  [name] has agreed to follow-up with our clinic in 4 weeks. He was informed of the importance of frequent follow-up visits to maximize his success with intensive lifestyle modifications for his multiple health conditions.   Objective:   Blood pressure 123/78, pulse 83, temperature 98.3 F (36.8 C), height 5\' 8"  (1.727 m), weight 294 lb (133.4 kg), SpO2 97 %. Body mass index is 44.7 kg/m.  General: Cooperative, alert, well developed, in no acute distress. HEENT: Conjunctivae and lids unremarkable. Cardiovascular: Regular rhythm.  Lungs: Normal work of breathing. Neurologic: No focal deficits.   Lab Results  Component Value Date   CREATININE 0.76 01/27/2021   BUN 11 01/27/2021   NA 145 (H) 01/27/2021   K 3.7 01/27/2021   CL 105 01/27/2021   CO2 26 01/27/2021   Lab Results  Component Value  Date   ALT 59 (H) 01/27/2021   AST 39 01/27/2021   ALKPHOS 68 01/27/2021   BILITOT 0.6 01/27/2021   Lab Results  Component Value Date   HGBA1C 7.6 (H) 12/25/2020   HGBA1C 6.7 (H) 08/06/2020   HGBA1C 5.8 (H) 01/03/2020   HGBA1C 6.1 (H) 09/13/2019   HGBA1C 6.1 (H) 05/15/2019   Lab Results  Component Value Date   INSULIN 185.0 (H) 08/06/2020   INSULIN 83.4 (H) 01/03/2020   INSULIN 48.4 (H) 09/13/2019    INSULIN 94.0 (H) 05/15/2019   INSULIN 76.8 (H) 09/26/2018   Lab Results  Component Value Date   TSH 2.200 09/26/2018   Lab Results  Component Value Date   CHOL 115 01/27/2021   HDL 33 (L) 01/27/2021   LDLCALC 40 01/27/2021   TRIG 277 (H) 01/27/2021   CHOLHDL 3.4 08/24/2017   Lab Results  Component Value Date   VD25OH 36.6 12/25/2020   VD25OH 38.1 08/06/2020   VD25OH 25.0 (L) 01/03/2020   Lab Results  Component Value Date   WBC 6.0 05/15/2019   HGB 17.3 05/15/2019   HCT 51.1 (H) 05/15/2019   MCV 81 05/15/2019   PLT 163 05/15/2019   No results found for: IRON, TIBC, FERRITIN  Attestation Statements:   Reviewed by clinician on day of visit: allergies, medications, problem list, medical history, surgical history, family history, social history, and previous encounter notes.   I, Burt Knack, am acting as transcriptionist for Quillian Quince, MD.  I have reviewed the above documentation for accuracy and completeness, and I agree with the above. -  Quillian Quince, MD

## 2021-04-28 ENCOUNTER — Ambulatory Visit (INDEPENDENT_AMBULATORY_CARE_PROVIDER_SITE_OTHER): Payer: 59 | Admitting: Family Medicine

## 2021-04-28 ENCOUNTER — Encounter (INDEPENDENT_AMBULATORY_CARE_PROVIDER_SITE_OTHER): Payer: Self-pay | Admitting: Family Medicine

## 2021-04-28 ENCOUNTER — Other Ambulatory Visit: Payer: Self-pay

## 2021-04-28 VITALS — BP 110/71 | Temp 98.3°F | Ht 68.0 in | Wt 295.0 lb

## 2021-04-28 DIAGNOSIS — E860 Dehydration: Secondary | ICD-10-CM | POA: Diagnosis not present

## 2021-04-28 DIAGNOSIS — Z6841 Body Mass Index (BMI) 40.0 and over, adult: Secondary | ICD-10-CM | POA: Diagnosis not present

## 2021-04-28 NOTE — Progress Notes (Signed)
Chief Complaint:   OBESITY Brandon [name] is here to discuss his progress with his obesity treatment plan along with follow-up of his obesity related diagnoses. Brandon [name] is on the Category 4 Plan and states he is following his eating plan approximately 25% of the time. Brandon [name] states he is walking for 10 minutes 5 times per week.  Today's visit was #: 51 Starting weight: 305 lbs Starting date: 09/15/2017 Today's weight: 295 lbs Today's date: 04/28/2021 Total lbs lost to date: 10 Total lbs lost since last in-office visit: 0  Interim History: Brandon [name] struggles to follow his Category 4 plan strictly. He is retaining some fluid today, and overall he has done well maintaining his weight. His next goal is to get back to his lowest weight here which is about 10 more lbs down.  Subjective:   1. Dehydration [name] continues to struggle to meet his hydration goals. He isn't very thirsty and frequently forgets to drink water. He notes constipation.  Assessment/Plan:   1. Dehydration Brandon [name] was encouraged to increase his water intake to 80+ oz per day.  2. Obesity with current BMI 44.9 Brandon [name] is currently in the action stage of change. As such, his goal is to continue with weight loss efforts. He has agreed to the Category 4 Plan.   Exercise goals: As is.  Behavioral modification strategies: increasing water intake and holiday eating strategies .  Brandon [name] has agreed to follow-up with our clinic in 4 weeks. He was informed of the importance of frequent follow-up visits to maximize his success with intensive lifestyle modifications for his multiple health conditions.   Objective:   Blood pressure 110/71, temperature 98.3 F (36.8 C), height 5\' 8"  (1.727 m), weight 295 lb (133.8 kg), SpO2 98 %. Body mass index is 44.85 kg/m.  General: Cooperative, alert, well developed, in no acute distress. HEENT: Conjunctivae and lids unremarkable. Cardiovascular: Regular rhythm.  Lungs: Normal work  of breathing. Neurologic: No focal deficits.   Lab Results  Component Value Date   CREATININE 0.76 01/27/2021   BUN 11 01/27/2021   NA 145 (H) 01/27/2021   K 3.7 01/27/2021   CL 105 01/27/2021   CO2 26 01/27/2021   Lab Results  Component Value Date   ALT 59 (H) 01/27/2021   AST 39 01/27/2021   ALKPHOS 68 01/27/2021   BILITOT 0.6 01/27/2021   Lab Results  Component Value Date   HGBA1C 7.6 (H) 12/25/2020   HGBA1C 6.7 (H) 08/06/2020   HGBA1C 5.8 (H) 01/03/2020   HGBA1C 6.1 (H) 09/13/2019   HGBA1C 6.1 (H) 05/15/2019   Lab Results  Component Value Date   INSULIN 185.0 (H) 08/06/2020   INSULIN 83.4 (H) 01/03/2020   INSULIN 48.4 (H) 09/13/2019   INSULIN 94.0 (H) 05/15/2019   INSULIN 76.8 (H) 09/26/2018   Lab Results  Component Value Date   TSH 2.200 09/26/2018   Lab Results  Component Value Date   CHOL 115 01/27/2021   HDL 33 (L) 01/27/2021   LDLCALC 40 01/27/2021   TRIG 277 (H) 01/27/2021   CHOLHDL 3.4 08/24/2017   Lab Results  Component Value Date   VD25OH 36.6 12/25/2020   VD25OH 38.1 08/06/2020   VD25OH 25.0 (L) 01/03/2020   Lab Results  Component Value Date   WBC 6.0 05/15/2019   HGB 17.3 05/15/2019   HCT 51.1 (H) 05/15/2019   MCV 81 05/15/2019   PLT 163 05/15/2019   No results found for: IRON, TIBC, FERRITIN  Attestation Statements:  Reviewed by clinician on day of visit: allergies, medications, problem list, medical history, surgical history, family history, social history, and previous encounter notes.  Time spent on visit including pre-visit chart review and post-visit care and charting was 25 minutes.    I, Burt Knack, am acting as transcriptionist for Quillian Quince, MD.  I have reviewed the above documentation for accuracy and completeness, and I agree with the above. -  Quillian Quince, MD

## 2021-05-08 DIAGNOSIS — I152 Hypertension secondary to endocrine disorders: Secondary | ICD-10-CM | POA: Diagnosis not present

## 2021-05-08 DIAGNOSIS — Z23 Encounter for immunization: Secondary | ICD-10-CM | POA: Diagnosis not present

## 2021-05-08 DIAGNOSIS — E1159 Type 2 diabetes mellitus with other circulatory complications: Secondary | ICD-10-CM | POA: Diagnosis not present

## 2021-05-08 DIAGNOSIS — L84 Corns and callosities: Secondary | ICD-10-CM | POA: Diagnosis not present

## 2021-05-08 DIAGNOSIS — G959 Disease of spinal cord, unspecified: Secondary | ICD-10-CM | POA: Diagnosis not present

## 2021-05-08 DIAGNOSIS — F321 Major depressive disorder, single episode, moderate: Secondary | ICD-10-CM | POA: Diagnosis not present

## 2021-05-17 ENCOUNTER — Emergency Department (HOSPITAL_COMMUNITY): Payer: 59

## 2021-05-17 ENCOUNTER — Emergency Department (HOSPITAL_COMMUNITY)
Admission: EM | Admit: 2021-05-17 | Discharge: 2021-05-18 | Disposition: A | Discharge status: Home / Self Care | Payer: 59 | Attending: Emergency Medicine | Admitting: Emergency Medicine

## 2021-05-17 ENCOUNTER — Other Ambulatory Visit: Payer: Self-pay

## 2021-05-17 ENCOUNTER — Encounter (HOSPITAL_COMMUNITY): Payer: Self-pay

## 2021-05-17 DIAGNOSIS — I10 Essential (primary) hypertension: Secondary | ICD-10-CM | POA: Insufficient documentation

## 2021-05-17 DIAGNOSIS — Z87891 Personal history of nicotine dependence: Secondary | ICD-10-CM | POA: Diagnosis not present

## 2021-05-17 DIAGNOSIS — M62838 Other muscle spasm: Secondary | ICD-10-CM

## 2021-05-17 DIAGNOSIS — M25511 Pain in right shoulder: Secondary | ICD-10-CM | POA: Diagnosis not present

## 2021-05-17 DIAGNOSIS — Z79899 Other long term (current) drug therapy: Secondary | ICD-10-CM | POA: Insufficient documentation

## 2021-05-17 DIAGNOSIS — M19011 Primary osteoarthritis, right shoulder: Secondary | ICD-10-CM

## 2021-05-17 DIAGNOSIS — Z7984 Long term (current) use of oral hypoglycemic drugs: Secondary | ICD-10-CM | POA: Diagnosis not present

## 2021-05-17 DIAGNOSIS — E119 Type 2 diabetes mellitus without complications: Secondary | ICD-10-CM | POA: Insufficient documentation

## 2021-05-17 NOTE — ED Provider Notes (Signed)
Emergency Medicine Provider Triage Evaluation Note  Brandon [name] , a 50 y.o. male  was evaluated in triage.  Pt complains of right shoulder pain. He states that his symptoms began yesterday morning after he woke up and has worsened significantly in the last few hours. He states that he has ADEM which has caused him to have chronic muscle spasms for which he takes cyclobenzaprine TID. He states that his current pain is 10/10 and 'feels like someone is actively trying to dislocate my shoulder.' He states that he has never had pain this severe before. He went to urgent care this morning for symptoms, they gave him Valium but he says his symptoms have only worsened since his UC visit. He endorses associated sharp shooting pain down his right arm as well. He states last time he had pain similar to this he was found to have compressed nerves in his neck. He denies fevers or chills  Review of Systems  Positive: Right shoulder pain Negative: Fevers, chills  Physical Exam  BP (!) 143/81 (BP Location: Left Arm)   Pulse 92   Temp 99.4 F (37.4 C) (Oral)   Resp 20   SpO2 97%  Gen:   Awake, some distress  Resp:  Normal effort MSK:   Did not assess right shoulder ROM due to pain. Marked tenderness noted to right shoulder and scapula area. Notable muscle tightness noted to shoulder.  Other:  No midline C spine tenderness or deformity  Medical Decision Making  Medically screening exam initiated at 9:36 PM.  Appropriate orders placed.  QUE MENEELY was informed that the remainder of the evaluation will be completed by another provider, this initial triage assessment does not replace that evaluation, and the importance of remaining in the ED until their evaluation is complete.  Discussed neck imaging with patient who feels that he would be unable to lie flat given current pain level. Will wait for better pain control.   Vear Clock 05/17/21 2142    Tegeler, Canary Brim, MD 05/18/21  [phone]

## 2021-05-17 NOTE — ED Triage Notes (Signed)
Pt reports with right shoulder pain x 2 days. Pt states that he has a muscle spasm that will not go away.

## 2021-05-18 ENCOUNTER — Encounter (HOSPITAL_COMMUNITY): Payer: Self-pay | Admitting: Emergency Medicine

## 2021-05-18 DIAGNOSIS — M25511 Pain in right shoulder: Secondary | ICD-10-CM | POA: Diagnosis not present

## 2021-05-18 MED ORDER — KETOROLAC TROMETHAMINE 30 MG/ML IJ SOLN
30.0000 mg | Freq: Once | INTRAMUSCULAR | Status: AC
  Administered 2021-05-18: 30 mg via INTRAMUSCULAR
  Filled 2021-05-18: qty 1

## 2021-05-18 MED ORDER — LIDOCAINE 5 % EX PTCH
2.0000 | MEDICATED_PATCH | CUTANEOUS | Status: DC
  Administered 2021-05-18: 2 via TRANSDERMAL
  Filled 2021-05-18: qty 2

## 2021-05-18 MED ORDER — LIDOCAINE 5 % EX PTCH: 1.0000 | MEDICATED_PATCH | CUTANEOUS | 0 refills | Status: DC

## 2021-05-18 MED ORDER — PREDNISONE 20 MG PO TABS
60.0000 mg | ORAL_TABLET | Freq: Once | ORAL | Status: AC
  Administered 2021-05-18: 60 mg via ORAL
  Filled 2021-05-18: qty 3

## 2021-05-18 MED ORDER — PREDNISONE 20 MG PO TABS: ORAL_TABLET | ORAL | 0 refills | Status: DC

## 2021-05-18 NOTE — ED Provider Notes (Signed)
[facility]  HOSPITAL-EMERGENCY DEPT Provider Note   CSN: [id number] Arrival date & time: 05/17/21  2113     History Chief Complaint  Patient presents with   Shoulder Pain    Brandon [name] is a 50 y.o. male.  The history is provided by the patient.  Shoulder Pain Location:  Shoulder Shoulder location:  R shoulder Injury: no   Pain details:    Quality:  Cramping   Radiates to:  Does not radiate   Severity:  Severe   Onset quality:  Sudden   Duration:  1 day (acute worsening on chronic)   Timing:  Constant   Progression:  Unchanged Dislocation: no   Foreign body present:  No foreign bodies Prior injury to area:  No Relieved by:  Nothing Worsened by:  Nothing Ineffective treatments:  None tried Associated symptoms: no back pain, no fatigue, no fever, no muscle weakness and no neck pain   Risk factors: no concern for non-accidental trauma       Past Medical History:  Diagnosis Date   ADEM (acute disseminated encephalomyelitis)    Allergy    Anxiety 2005   Complication of anesthesia    Constipation    Depression 2005   Diabetes mellitus without complication (HCC)    Type 2   Difficult intubation    difficult intubating and exbutating "throat Clinched around tube" during an Endoscopy procedure   Dyspnea    with exertion   Headache(784.0)    Hearing loss    Hyperinsulinemia 07/04/11   "I'm on metformin cause I produce too much insulin"   Hyperlipidemia    Hypertension    Mononucleosis, infectious, with hepatitis 06/2011   MS (multiple sclerosis) (HCC)    patinet has ADEM not MS per Patient   Nausea    occasional per history form 08/18/11   Neuromuscular disorder (HCC)    Pneumonia 2010   Sleep apnea    uses CPAP at home   Stroke [facility]) 2000   TIA   TIA (transient ischemic attack) 2000   denies residual   TIA (transient ischemic attack)     Patient Active Problem List   Diagnosis Date Noted   Depression with anxiety 04/13/2019   Class 3  severe obesity with serious comorbidity and body mass index (BMI) of 40.0 to 44.9 in adult (HCC) 12/28/2018   Chronic neck pain 02/17/2018   Other fatigue 09/15/2017   Shortness of breath on exertion 09/15/2017   Type 2 diabetes mellitus without complication, without long-term current use of insulin (HCC) 09/15/2017   Other hyperlipidemia 09/15/2017   Essential hypertension 09/15/2017   DDD (degenerative disc disease), cervical 05/03/2017   Cervical herniated disc 10/21/2016   Fatty liver 09/22/2016   Spinal stenosis in cervical region 08/05/2016   Diabetes type 2, controlled (HCC) 10/15/2015   Hypertriglyceridemia 10/15/2015   Benign essential HTN 12/05/2014   Macroglossia 09/05/2014   GAD (generalized anxiety disorder) 12/11/2013   Motor tic disorder 12/11/2013   OSA on CPAP 10/04/2013   Hypersomnia with sleep apnea, unspecified 10/04/2013   High frequency hearing loss 08/24/2013   Peripheral neuropathy 04/19/2013   Abnormal gait 10/19/2012   Cervical myelopathy (HCC) 10/19/2012   Esophageal reflux 07/27/2011   Morbid obesity due to excess calories (HCC) 07/22/2011   ADD (attention deficit disorder) 07/22/2011   ADEM (acute disseminated encephalomyelitis) 07/03/2011   Splenomegaly 07/03/2011   Elevated LFTs 07/03/2011    Past Surgical History:  Procedure Laterality Date   ANTERIOR CERVICAL DECOMP/DISCECTOMY  FUSION N/A 10/21/2016   Procedure: ANTERIOR CERVICAL DECOMPRESSION/DISCECTOMY FUSION, INTERBODY PROSTHESIS,PLATE CERVICAL FOUR- CERVICAL FIVE, CERVICAL FIVE- CERVICAL SIX;  Surgeon: Tressie Stalker, MD;  Location: [facility] OR;  Service: Neurosurgery;  Laterality: N/A;   ANTERIOR CERVICAL DECOMP/DISCECTOMY FUSION N/A 05/19/2018   Procedure: ANTERIOR CERVICAL DECOMPRESSION/DISCECTOMY FUSION, INTERBODY PROSTHESIS,PLATE/SCREWS CERVICAL SIX- CERVICAL SEVEN; EXPLORE FUSION;  Surgeon: Tressie Stalker, MD;  Location: [facility] OR;  Service: Neurosurgery;  Laterality: N/A;   CHOLECYSTECTOMY   08/02/2011   Procedure: LAPAROSCOPIC CHOLECYSTECTOMY;  Surgeon: Shelly Rubenstein, MD;  Location: [facility] OR;  Service: General;;   COLONOSCOPY  2003   ESOPHAGOGASTRODUODENOSCOPY  2003   ESOPHAGOGASTRODUODENOSCOPY  07/28/2011   Procedure: ESOPHAGOGASTRODUODENOSCOPY (EGD);  Surgeon: [name], MD;  Location: [facility] ENDOSCOPY;  Service: Endoscopy;  Laterality: N/A;   MYRINGOTOMY  1979; 1980   bilaterally   TONSILLECTOMY AND ADENOIDECTOMY  ~ 1980       Family History  Problem Relation Age of Onset   Coronary artery disease Father    Stroke Father    Cancer Father    High blood pressure Father    Alcoholism Father    Diabetes Mother    Obesity Mother    Cancer Maternal Grandmother        bone   Cancer Other        Stomach -Great-great grandmother    Social History   Tobacco Use   Smoking status: Former    Packs/day: 3.00    Years: 3.00    Pack years: 9.00    Types: Cigarettes    Quit date: 08/18/1987    Years since quitting: 33.7   Smokeless tobacco: Never  Vaping Use   Vaping Use: Never used  Substance Use Topics   Alcohol use: Not Currently    Alcohol/week: 0.0 standard drinks    Comment: 07/04/11 "glass of wine or spirits once a month"   Drug use: Not Currently    Types: Marijuana    Comment: have not used in a 1.5 years    Home Medications Prior to Admission medications   Medication Sig Start Date End Date Taking? Authorizing Provider  atorvastatin (LIPITOR) 20 MG tablet TAKE 1 TABLET (20 MG TOTAL) BY MOUTH AT BEDTIME. Patient taking differently: Take 20 mg by mouth at bedtime. 03/10/18   Valarie Cones, Dema Severin, PA-C  colesevelam [facility] 625 MG tablet Take 3 tablets (1,875 mg total) by mouth daily with breakfast. 02/28/18   Valarie Cones, Dema Severin, PA-C  cyclobenzaprine (AMRIX) 15 MG 24 hr capsule Take 15 mg by mouth 2 (two) times daily.    [provider]  gabapentin (NEURONTIN) 300 MG capsule Take 3 capsules (900 mg total) by mouth 2 (two) times daily. 08/24/17   Weber, Dema Severin, PA-C  Insulin Pen Needle (BD PEN NEEDLE NANO U/F) 32G X 4 MM MISC 1 Package by Does not apply route 2 (two) times daily. 03/31/21   Quillian Quince D, MD  liraglutide (VICTOZA) 18 MG/3ML SOPN Inject 1.2 mg into the skin every morning. 03/31/21   Quillian Quince D, MD  metFORMIN (GLUCOPHAGE-XR) 500 MG 24 hr tablet TAKE 2 TABLETS (1,000 MG TOTAL) BY MOUTH 2 (TWO) TIMES DAILY. Patient taking differently: Take 1,000 mg by mouth 2 (two) times daily. TAKE 2 TABLETS (1,000 MG TOTAL) BY MOUTH 2 (TWO) TIMES DAILY. 03/25/18   Georgina Quint, MD  NON FORMULARY CPAP machine at bedtime & sleep    [provider]  Vitamin D, Ergocalciferol, (DRISDOL) 1.25 MG (50000 UNIT) CAPS capsule TAKE  1 CAPSULE BY MOUTH EVERY 3 DAYS 03/31/21   Quillian Quince D, MD    Allergies    Amoxicillin, Penicillin g, Sulfa antibiotics, Cephalosporins, Quinolones, and Sulfamethoxazole  Review of Systems   Review of Systems  Constitutional:  Negative for fatigue and fever.  HENT:  Negative for facial swelling.   Eyes:  Negative for redness.  Respiratory:  Negative for wheezing and stridor.   Cardiovascular:  Negative for leg swelling.  Gastrointestinal:  Negative for vomiting.  Genitourinary:  Negative for difficulty urinating.  Musculoskeletal:  Positive for arthralgias. Negative for back pain and neck pain.  Skin:  Negative for rash.  Neurological:  Negative for facial asymmetry.  Psychiatric/Behavioral:  Negative for agitation.   All other systems reviewed and are negative.  Physical Exam Updated Vital Signs BP (!) 170/111   Pulse 94   Temp 99.4 F (37.4 C) (Oral)   Resp 19   SpO2 98%   Physical Exam Vitals and nursing note reviewed.  Constitutional:      General: He is not in acute distress.    Appearance: Normal appearance.  HENT:     Head: Normocephalic and atraumatic.     Nose: Nose normal.  Eyes:     Conjunctiva/sclera: Conjunctivae normal.     Pupils: Pupils are equal, round, and reactive  to light.  Cardiovascular:     Rate and Rhythm: Normal rate and regular rhythm.     Pulses: Normal pulses.     Heart sounds: Normal heart sounds.  Pulmonary:     Effort: Pulmonary effort is normal.     Breath sounds: Normal breath sounds.  Abdominal:     General: Abdomen is flat. Bowel sounds are normal.     Palpations: Abdomen is soft.     Tenderness: There is no abdominal tenderness. There is no guarding.  Musculoskeletal:        General: No swelling. Normal range of motion.     Right shoulder: No swelling, deformity, effusion, laceration, bony tenderness or crepitus. Normal strength. Normal pulse.     Cervical back: Normal range of motion and neck supple.     Comments: FROM, with pain.  No winging of scapula   Skin:    General: Skin is warm and dry.     Capillary Refill: Capillary refill takes less than 2 seconds.  Neurological:     General: No focal deficit present.     Mental Status: He is alert and oriented to person, place, and time.     Deep Tendon Reflexes: Reflexes normal.  Psychiatric:        Mood and Affect: Mood normal.        Behavior: Behavior normal.    ED Results / Procedures / Treatments   Labs (all labs ordered are listed, but only abnormal results are displayed) Labs Reviewed - No data to display  EKG None  Radiology DG Shoulder Right  Result Date: 05/17/2021 CLINICAL DATA:  Right shoulder pain. EXAM: RIGHT SHOULDER - 2+ VIEW COMPARISON:  None. FINDINGS: No acute fracture or dislocation. The bony density is seen superior to the greater tuberosity, suggesting calcific tendinosis. Mild degenerative changes are present at the acromioclavicular joint. The soft tissues are unremarkable. IMPRESSION: 1. No acute fracture or dislocation. 2. Findings suggestive of calcific tendinosis. 3. Mild degenerative changes at the acromioclavicular joint. Electronically Signed   By: Thornell Sartorius M.D.   On: 05/17/2021 21:51    Procedures Procedures   Medications Ordered  in ED Medications  lidocaine (LIDODERM) 5 % 2 patch (2 patches Transdermal Patch Applied 05/18/21 0109)  predniSONE (DELTASONE) tablet 60 mg (60 mg Oral Given 05/18/21 0109)  ketorolac (TORADOL) 30 MG/ML injection 30 mg (30 mg Intramuscular Given 05/18/21 0110)    ED Course  I have reviewed the triage vital signs and the nursing notes.  Pertinent labs & imaging results that were available during my care of the patient were reviewed by me and considered in my medical decision making (see chart for details).   Already on neurontin, flexeril and valium for this issue as it is acute on chronic.  Will add lidoderm and steroids.  Informed he must move the shoulder so it does not become frozen.  NEERS test is negative and rotator cuff is strong.  Stable for discharge with close follow up.  Follow up with orthopedics for ongoing care.   Brandon [name] was evaluated in Emergency Department on 05/18/2021 for the symptoms described in the history of present illness. He was evaluated in the context of the global COVID-19 pandemic, which necessitated consideration that the patient might be at risk for infection with the SARS-CoV-2 virus that causes COVID-19. Institutional protocols and algorithms that pertain to the evaluation of patients at risk for COVID-19 are in a state of rapid change based on information released by regulatory bodies including the CDC and federal and state organizations. These policies and algorithms were followed during the patient's care in the ED.  Final Clinical Impression(s) / ED Diagnoses Final diagnoses:  Acute pain of right shoulder   Return for intractable cough, coughing up blood, fevers > 100.4 unrelieved by medication, shortness of breath, intractable vomiting, chest pain, shortness of breath, weakness, numbness, changes in speech, facial asymmetry, abdominal pain, passing out, Inability to tolerate liquids or food, cough, altered mental status or any concerns. No signs of  systemic illness or infection. The patient is nontoxic-appearing on exam and vital signs are within normal limits.  I have reviewed the triage vital signs and the nursing notes. Pertinent labs & imaging results that were available during my care of the patient were reviewed by me and considered in my medical decision making (see chart for details). After history, exam, and medical workup I feel the patient has been appropriately medically screened and is safe for discharge home. Pertinent diagnoses were discussed with the patient. Patient was given return precautions.  Rx / DC Orders ED Discharge Orders     None        [name] Noreen, MD 05/18/21 [id number]

## 2021-05-21 ENCOUNTER — Other Ambulatory Visit: Payer: Self-pay

## 2021-05-21 ENCOUNTER — Ambulatory Visit (INDEPENDENT_AMBULATORY_CARE_PROVIDER_SITE_OTHER): Payer: 59 | Admitting: Orthopaedic Surgery

## 2021-05-21 DIAGNOSIS — M7531 Calcific tendinitis of right shoulder: Secondary | ICD-10-CM

## 2021-05-21 DIAGNOSIS — M25511 Pain in right shoulder: Secondary | ICD-10-CM

## 2021-05-21 MED ORDER — METHYLPREDNISOLONE ACETATE 40 MG/ML IJ SUSP
40.0000 mg | INTRAMUSCULAR | Status: AC | PRN
Start: 2021-05-21 — End: 2021-05-21
  Administered 2021-05-21: 40 mg via INTRA_ARTICULAR

## 2021-05-21 MED ORDER — LIDOCAINE HCL 1 % IJ SOLN
3.0000 mL | INTRAMUSCULAR | Status: AC | PRN
  Administered 2021-05-21: 3 mL

## 2021-05-21 MED ORDER — BUPIVACAINE HCL 0.5 % IJ SOLN
3.0000 mL | INTRAMUSCULAR | Status: AC | PRN
  Administered 2021-05-21: 3 mL via INTRA_ARTICULAR

## 2021-05-21 NOTE — Progress Notes (Signed)
Office Visit Note   Patient: Brandon [name]           Date of Birth: May 22, 1971           MRN: [id number] Visit Date: 05/21/2021              Requested by: Morrell Riddle, PA-C [address] [place],  [place] [address] PCP: Morrell Riddle, PA-C   Assessment & Plan: Visit Diagnoses:  1. Calcific tendonitis of right shoulder     Plan: Impression is right shoulder calcific tendinitis.  Treatment options discussed and he elected to move forward with a subacromial injection as well as a course of outpatient physical therapy.  He will follow-up if symptoms do not improve.  Follow-Up Instructions: No follow-ups on file.   Orders:  Orders Placed This Encounter  Procedures   Ambulatory referral to Physical Therapy   No orders of the defined types were placed in this encounter.     Procedures: Large Joint Inj: R subacromial bursa on 05/21/2021 9:59 AM Indications: pain Details: 22 G needle  Arthrogram: No  Medications: 3 mL lidocaine 1 %; 3 mL bupivacaine 0.5 %; 40 mg methylPREDNISolone acetate 40 MG/ML Outcome: tolerated well, no immediate complications Consent was given by the patient. Patient was prepped and draped in the usual sterile fashion.      Clinical Data: No additional findings.   Subjective: Chief Complaint  Patient presents with   Right Shoulder - Follow-up    HPI  Brandon [name] is follow-up from the ED from this past weekend.  He woke up with intense right shoulder pain with muscle spasms.  He was evaluated in the ED and the urgent care and x-rays showed calcific tendinitis.  He does have diabetes and acute disseminated encephalomyelitis at baseline.  He states that overall the pain is slightly improved.  He is taking prednisone meloxicam and gabapentin.  Pain is currently 5 out of 10.  He denies any constitutional symptoms or trauma.  Review of Systems  Constitutional: Negative.   All other systems reviewed and are  negative.   Objective: Vital Signs: There were no vitals taken for this visit.  Physical Exam Vitals and nursing note reviewed.  Constitutional:      Appearance: He is well-developed.  HENT:     Head: Normocephalic and atraumatic.  Eyes:     Pupils: Pupils are equal, round, and reactive to light.  Pulmonary:     Effort: Pulmonary effort is normal.  Abdominal:     Palpations: Abdomen is soft.  Musculoskeletal:        General: Normal range of motion.     Cervical back: Neck supple.  Skin:    General: Skin is warm.  Neurological:     Mental Status: He is alert and oriented to person, place, and time.  Psychiatric:        Behavior: Behavior normal.        Thought Content: Thought content normal.        Judgment: Judgment normal.    Ortho Exam  Right shoulder examination is limited by pain and guarding.  Gentle range of motion with the arm by his side is well-tolerated.  No obvious deficits with the rotator cuff.  He has tenderness to the [facility] joint.  Specialty Comments:  No specialty comments available.  Imaging: No results found.   PMFS History: Patient Active Problem List   Diagnosis Date Noted   Depression with anxiety 04/13/2019  Class 3 severe obesity with serious comorbidity and body mass index (BMI) of 40.0 to 44.9 in adult [facility]) 12/28/2018   Chronic neck pain 02/17/2018   Other fatigue 09/15/2017   Shortness of breath on exertion 09/15/2017   Type 2 diabetes mellitus without complication, without long-term current use of insulin (HCC) 09/15/2017   Other hyperlipidemia 09/15/2017   Essential hypertension 09/15/2017   DDD (degenerative disc disease), cervical 05/03/2017   Cervical herniated disc 10/21/2016   Fatty liver 09/22/2016   Spinal stenosis in cervical region 08/05/2016   Diabetes type 2, controlled (HCC) 10/15/2015   Hypertriglyceridemia 10/15/2015   Benign essential HTN 12/05/2014   Macroglossia 09/05/2014   GAD (generalized anxiety disorder)  12/11/2013   Motor tic disorder 12/11/2013   OSA on CPAP 10/04/2013   Hypersomnia with sleep apnea, unspecified 10/04/2013   High frequency hearing loss 08/24/2013   Peripheral neuropathy 04/19/2013   Abnormal gait 10/19/2012   Cervical myelopathy (HCC) 10/19/2012   Esophageal reflux 07/27/2011   Morbid obesity due to excess calories (HCC) 07/22/2011   ADD (attention deficit disorder) 07/22/2011   ADEM (acute disseminated encephalomyelitis) 07/03/2011   Splenomegaly 07/03/2011   Elevated LFTs 07/03/2011   Past Medical History:  Diagnosis Date   ADEM (acute disseminated encephalomyelitis)    Allergy    Anxiety 2005   Complication of anesthesia    Constipation    Depression 2005   Diabetes mellitus without complication (HCC)    Type 2   Difficult intubation    difficult intubating and exbutating "throat Clinched around tube" during an Endoscopy procedure   Dyspnea    with exertion   Headache(784.0)    Hearing loss    Hyperinsulinemia 07/04/11   "I'm on metformin cause I produce too much insulin"   Hyperlipidemia    Hypertension    Mononucleosis, infectious, with hepatitis 06/2011   MS (multiple sclerosis) (HCC)    patinet has ADEM not MS per Patient   Nausea    occasional per history form 08/18/11   Neuromuscular disorder (HCC)    Pneumonia 2010   Sleep apnea    uses CPAP at home   Stroke [facility]) 2000   TIA   TIA (transient ischemic attack) 2000   denies residual   TIA (transient ischemic attack)     Family History  Problem Relation Age of Onset   Coronary artery disease Father    Stroke Father    Cancer Father    High blood pressure Father    Alcoholism Father    Diabetes Mother    Obesity Mother    Cancer Maternal Grandmother        bone   Cancer Other        Stomach -Great-great grandmother    Past Surgical History:  Procedure Laterality Date   ANTERIOR CERVICAL DECOMP/DISCECTOMY FUSION N/A 10/21/2016   Procedure: ANTERIOR CERVICAL  DECOMPRESSION/DISCECTOMY FUSION, INTERBODY PROSTHESIS,PLATE CERVICAL FOUR- CERVICAL FIVE, CERVICAL FIVE- CERVICAL SIX;  Surgeon: Tressie Stalker, MD;  Location: [facility] OR;  Service: Neurosurgery;  Laterality: N/A;   ANTERIOR CERVICAL DECOMP/DISCECTOMY FUSION N/A 05/19/2018   Procedure: ANTERIOR CERVICAL DECOMPRESSION/DISCECTOMY FUSION, INTERBODY PROSTHESIS,PLATE/SCREWS CERVICAL SIX- CERVICAL SEVEN; EXPLORE FUSION;  Surgeon: Tressie Stalker, MD;  Location: [facility] OR;  Service: Neurosurgery;  Laterality: N/A;   CHOLECYSTECTOMY  08/02/2011   Procedure: LAPAROSCOPIC CHOLECYSTECTOMY;  Surgeon: Shelly Rubenstein, MD;  Location: [facility] OR;  Service: General;;   COLONOSCOPY  2003   ESOPHAGOGASTRODUODENOSCOPY  2003   ESOPHAGOGASTRODUODENOSCOPY  07/28/2011   Procedure: ESOPHAGOGASTRODUODENOSCOPY (  EGD);  Surgeon: [name], MD;  Location: [facility] ENDOSCOPY;  Service: Endoscopy;  Laterality: N/A;   MYRINGOTOMY  1979; 1980   bilaterally   TONSILLECTOMY AND ADENOIDECTOMY  ~ 1980   Social History   Occupational History   Occupation: disabled   Tobacco Use   Smoking status: Former    Packs/day: 3.00    Years: 3.00    Pack years: 9.00    Types: Cigarettes    Quit date: 08/18/1987    Years since quitting: 33.7   Smokeless tobacco: Never  Vaping Use   Vaping Use: Never used  Substance and Sexual Activity   Alcohol use: Not Currently    Alcohol/week: 0.0 standard drinks    Comment: 07/04/11 "glass of wine or spirits once a month"   Drug use: Not Currently    Types: Marijuana    Comment: have not used in a 1.5 years   Sexual activity: Yes    Partners: Female

## 2021-06-04 ENCOUNTER — Ambulatory Visit (INDEPENDENT_AMBULATORY_CARE_PROVIDER_SITE_OTHER): Payer: 59 | Admitting: Family Medicine

## 2021-06-04 ENCOUNTER — Other Ambulatory Visit: Payer: Self-pay

## 2021-06-04 ENCOUNTER — Encounter (INDEPENDENT_AMBULATORY_CARE_PROVIDER_SITE_OTHER): Payer: Self-pay | Admitting: Family Medicine

## 2021-06-04 VITALS — BP 120/77 | HR 88 | Temp 97.9°F | Ht 68.0 in | Wt 294.0 lb

## 2021-06-04 DIAGNOSIS — E1169 Type 2 diabetes mellitus with other specified complication: Secondary | ICD-10-CM

## 2021-06-04 DIAGNOSIS — Z6841 Body Mass Index (BMI) 40.0 and over, adult: Secondary | ICD-10-CM

## 2021-06-04 DIAGNOSIS — E7849 Other hyperlipidemia: Secondary | ICD-10-CM

## 2021-06-04 DIAGNOSIS — R7401 Elevation of levels of liver transaminase levels: Secondary | ICD-10-CM | POA: Diagnosis not present

## 2021-06-04 DIAGNOSIS — E559 Vitamin D deficiency, unspecified: Secondary | ICD-10-CM | POA: Diagnosis not present

## 2021-06-04 DIAGNOSIS — E7800 Pure hypercholesterolemia, unspecified: Secondary | ICD-10-CM | POA: Diagnosis not present

## 2021-06-04 MED ORDER — VITAMIN D (ERGOCALCIFEROL) 1.25 MG (50000 UNIT) PO CAPS: ORAL_CAPSULE | ORAL | 0 refills | Status: DC

## 2021-06-04 NOTE — Progress Notes (Signed)
Chief Complaint:   OBESITY Brandon [name] is here to discuss his progress with his obesity treatment plan along with follow-up of his obesity related diagnoses. Brandon [name] is on the Category 4 Plan and states he is following his eating plan approximately 20% of the time. Brandon [name] states he is doing 0 minutes 0 times per week.  Today's visit was #: 42 Starting weight: 305 lbs Starting date: 09/15/2017 Today's weight: 294 lbs Today's date: 06/04/2021 Total lbs lost to date: 11 Total lbs lost since last in-office visit: 1  Interim History: Brandon [name] has had a lot of health issues and he hasn'Brandon [name] been able to concentrate on weight loss.  Subjective:   1. Vitamin D deficiency Brandon [name] is on Vit D, and he requests a refill and he is due for labs.  2. Elevated ALT measurement Brandon [name]'s last ALT was mildly elevated, likely due to non-alcoholic fatty liver disease.  3. Other hyperlipidemia Brandon [name] is working on diet and exercise, and he is due for labs.  4. Type 2 diabetes mellitus with other specified complication, without long-term current use of insulin (HCC) Brandon [name] is working on diet and exercise. He is due to have labs rechecked.  Assessment/Plan:   1. Vitamin D deficiency We will check labs today, and we will refill prescription Vitamin D for 1 month. Brandon [name] will follow-up for routine testing of Vitamin D, at least 2-3 times per year to avoid over-replacement.  - Vitamin D, Ergocalciferol, (DRISDOL) 1.25 MG (50000 UNIT) CAPS capsule; TAKE 1 CAPSULE BY MOUTH EVERY 3 DAYS  Dispense: 10 capsule; Refill: 0 - VITAMIN D 25 Hydroxy (Vit-D Deficiency, Fractures)  2. Elevated ALT measurement We discussed the likely diagnosis of non-alcoholic fatty liver disease today and how this condition is obesity related. Brandon [name] was educated the importance of weight loss. We will check labs today. Brandon [name] agreed to continue with his weight loss efforts with healthier diet and exercise as an essential part of  his treatment plan.  - CMP14+EGFR  3. Other hyperlipidemia Cardiovascular risk and specific lipid/LDL goals reviewed. We discussed several lifestyle modifications today. We will check labs today. Brandon [name] will continue to work on diet, exercise and weight loss efforts. Orders and follow up as documented in patient record.   - Lipid Panel With LDL/HDL Ratio  4. Type 2 diabetes mellitus with other specified complication, without long-term current use of insulin (HCC) We will check labs today, and will follow up at his next visit. Brandon [name] will continue metformin. Good blood sugar control is important to decrease the likelihood of diabetic complications such as nephropathy, neuropathy, limb loss, blindness, coronary artery disease, and death. Intensive lifestyle modification including diet, exercise and weight loss are the first line of treatment for diabetes.   - CMP14+EGFR - Insulin, random - Hemoglobin A1c  5. Obesity BMI today is 17 Brandon [name] is currently in the action stage of change. As such, his goal is to maintain weight for now over the holidays. He has agreed to the Category 4 Plan.   Behavioral modification strategies: increasing lean protein intake, decreasing simple carbohydrates, and holiday eating strategies .  Brandon [name] has agreed to follow-up with our clinic in 4 to 5 weeks. He was informed of the importance of frequent follow-up visits to maximize his success with intensive lifestyle modifications for his multiple health conditions.   Brandon [name] was informed we would discuss his lab results at his next visit unless there is a critical issue that needs to be addressed sooner. Brandon [name] agreed to keep  his next visit at the agreed upon time to discuss these results.  Objective:   Blood pressure 120/77, pulse 88, temperature 97.9 F (36.6 C), height 5' 8"  (1.727 m), weight 294 lb (133.4 kg), SpO2 98 %. Body mass index is 44.7 kg/m.  General: Cooperative, alert, well developed, in no  acute distress. HEENT: Conjunctivae and lids unremarkable. Cardiovascular: Regular rhythm.  Lungs: Normal work of breathing. Neurologic: No focal deficits.   Lab Results  Component Value Date   CREATININE 0.76 01/27/2021   BUN 11 01/27/2021   NA 145 (H) 01/27/2021   K 3.7 01/27/2021   CL 105 01/27/2021   CO2 26 01/27/2021   Lab Results  Component Value Date   ALT 59 (H) 01/27/2021   AST 39 01/27/2021   ALKPHOS 68 01/27/2021   BILITOT 0.6 01/27/2021   Lab Results  Component Value Date   HGBA1C 7.6 (H) 12/25/2020   HGBA1C 6.7 (H) 08/06/2020   HGBA1C 5.8 (H) 01/03/2020   HGBA1C 6.1 (H) 09/13/2019   HGBA1C 6.1 (H) 05/15/2019   Lab Results  Component Value Date   INSULIN 185.0 (H) 08/06/2020   INSULIN 83.4 (H) 01/03/2020   INSULIN 48.4 (H) 09/13/2019   INSULIN 94.0 (H) 05/15/2019   INSULIN 76.8 (H) 09/26/2018   Lab Results  Component Value Date   TSH 2.200 09/26/2018   Lab Results  Component Value Date   CHOL 115 01/27/2021   HDL 33 (L) 01/27/2021   LDLCALC 40 01/27/2021   TRIG 277 (H) 01/27/2021   CHOLHDL 3.4 08/24/2017   Lab Results  Component Value Date   VD25OH 36.6 12/25/2020   VD25OH 38.1 08/06/2020   VD25OH 25.0 (L) 01/03/2020   Lab Results  Component Value Date   WBC 6.0 05/15/2019   HGB 17.3 05/15/2019   HCT 51.1 (H) 05/15/2019   MCV 81 05/15/2019   PLT 163 05/15/2019   No results found for: IRON, TIBC, FERRITIN  Obesity Behavioral Intervention:   Approximately 15 minutes were spent on the discussion below.  ASK: We discussed the diagnosis of obesity with Brandon [name] today and Brandon [name] agreed to give [place] permission to discuss obesity behavioral modification therapy today.  ASSESS: Brandon [name] has the diagnosis of obesity and his BMI today is 44.7. Brandon [name] is in the action stage of change.   ADVISE: Brandon [name] was educated on the multiple health risks of obesity as well as the benefit of weight loss to improve his health. He was advised of the need  for long term treatment and the importance of lifestyle modifications to improve his current health and to decrease his risk of future health problems.  AGREE: Multiple dietary modification options and treatment options were discussed and Brandon [name] agreed to follow the recommendations documented in the above note.  ARRANGE: [place] was educated on the importance of frequent visits to treat obesity as outlined per CMS and USPSTF guidelines and agreed to schedule his next follow up appointment today.  Attestation Statements:   Reviewed by clinician on day of visit: allergies, medications, problem list, medical history, surgical history, family history, social history, and previous encounter notes.   I, Trixie Dredge, am acting as transcriptionist for Dennard Nip, MD.  I have reviewed the above documentation for accuracy and completeness, and I agree with the above. -  Dennard Nip, MD

## 2021-06-05 LAB — CMP14+EGFR
ALT: 53 IU/L — ABNORMAL HIGH (ref 0–44)
AST: 26 IU/L (ref 0–40)
Albumin/Globulin Ratio: 1.9 (ref 1.2–2.2)
Albumin: 4.2 g/dL (ref 4.0–5.0)
Alkaline Phosphatase: 64 IU/L (ref 44–121)
BUN/Creatinine Ratio: 19 (ref 9–20)
BUN: 16 mg/dL (ref 6–24)
Bilirubin Total: 0.5 mg/dL (ref 0.0–1.2)
CO2: 27 mmol/L (ref 20–29)
Calcium: 9.4 mg/dL (ref 8.7–10.2)
Chloride: 97 mmol/L (ref 96–106)
Creatinine, Ser: 0.85 mg/dL (ref 0.76–1.27)
Globulin, Total: 2.2 g/dL (ref 1.5–4.5)
Glucose: 152 mg/dL — ABNORMAL HIGH (ref 70–99)
Potassium: 3.2 mmol/L — ABNORMAL LOW (ref 3.5–5.2)
Sodium: 141 mmol/L (ref 134–144)
Total Protein: 6.4 g/dL (ref 6.0–8.5)
eGFR: 107 mL/min/{1.73_m2} (ref >59)

## 2021-06-05 LAB — VITAMIN D 25 HYDROXY (VIT D DEFICIENCY, FRACTURES): Vit D, 25-Hydroxy: 42.6 ng/mL (ref 30.0–100.0)

## 2021-06-05 LAB — LIPID PANEL WITH LDL/HDL RATIO
Cholesterol, Total: 108 mg/dL (ref 100–199)
HDL: 33 mg/dL — ABNORMAL LOW (ref >39)
LDL Chol Calc (NIH): 32 mg/dL (ref 0–99)
LDL/HDL Ratio: 1 ratio (ref 0.0–3.6)
Triglycerides: 285 mg/dL — ABNORMAL HIGH (ref 0–149)
VLDL Cholesterol Cal: 43 mg/dL — ABNORMAL HIGH (ref 5–40)

## 2021-06-05 LAB — HEMOGLOBIN A1C
Est. average glucose Bld gHb Est-mCnc: 174 mg/dL
Hgb A1c MFr Bld: 7.7 % — ABNORMAL HIGH (ref 4.8–5.6)

## 2021-06-05 LAB — INSULIN, RANDOM: INSULIN: 126 u[IU]/mL — ABNORMAL HIGH (ref 2.6–24.9)

## 2021-06-10 ENCOUNTER — Other Ambulatory Visit: Payer: Self-pay

## 2021-06-10 ENCOUNTER — Ambulatory Visit: Payer: 59 | Attending: Orthopaedic Surgery

## 2021-06-10 DIAGNOSIS — M7531 Calcific tendinitis of right shoulder: Secondary | ICD-10-CM | POA: Insufficient documentation

## 2021-06-10 DIAGNOSIS — M25511 Pain in right shoulder: Secondary | ICD-10-CM | POA: Insufficient documentation

## 2021-06-10 DIAGNOSIS — M6281 Muscle weakness (generalized): Secondary | ICD-10-CM | POA: Insufficient documentation

## 2021-06-10 NOTE — Therapy (Signed)
[facility] [address] [place], [place], [address] Phone: [phone]   Fax:  [phone]  Physical Therapy Evaluation  Patient Details  Name: Brandon [name] MRN: [id number] Date of Birth: 17-Sep-1970 Referring Provider (PT): Tarry Kos, MD   Encounter Date: 06/10/2021   PT End of Session - 06/10/21 1716     Visit Number 1    Number of Visits 17    Date for PT Re-Evaluation 08/05/21    Authorization Type AET[employer] - FOTO 6th and 6th    Progress Note Due on Visit 10    PT Start Time 1620    PT Stop Time 1715    PT Time Calculation (min) 55 min    Activity Tolerance Patient tolerated treatment well             Past Medical History:  Diagnosis Date   ADEM (acute disseminated encephalomyelitis)    Allergy    Anxiety 2005   Complication of anesthesia    Constipation    Depression 2005   Diabetes mellitus without complication (HCC)    Type 2   Difficult intubation    difficult intubating and exbutating "throat Clinched around tube" during an Endoscopy procedure   Dyspnea    with exertion   Headache(784.0)    Hearing loss    Hyperinsulinemia 07/04/11   "I'm on metformin cause I produce too much insulin"   Hyperlipidemia    Hypertension    Mononucleosis, infectious, with hepatitis 06/2011   MS (multiple sclerosis) (HCC)    patinet has ADEM not MS per Patient   Nausea    occasional per history form 08/18/11   Neuromuscular disorder (HCC)    Pneumonia 2010   Sleep apnea    uses CPAP at home   Stroke [facility]) 2000   TIA   TIA (transient ischemic attack) 2000   denies residual   TIA (transient ischemic attack)     Past Surgical History:  Procedure Laterality Date   ANTERIOR CERVICAL DECOMP/DISCECTOMY FUSION N/A 10/21/2016   Procedure: ANTERIOR CERVICAL DECOMPRESSION/DISCECTOMY FUSION, INTERBODY PROSTHESIS,PLATE CERVICAL FOUR- CERVICAL FIVE, CERVICAL FIVE- CERVICAL SIX;  Surgeon: Tressie Stalker, MD;  Location: [facility]  OR;  Service: Neurosurgery;  Laterality: N/A;   ANTERIOR CERVICAL DECOMP/DISCECTOMY FUSION N/A 05/19/2018   Procedure: ANTERIOR CERVICAL DECOMPRESSION/DISCECTOMY FUSION, INTERBODY PROSTHESIS,PLATE/SCREWS CERVICAL SIX- CERVICAL SEVEN; EXPLORE FUSION;  Surgeon: Tressie Stalker, MD;  Location: [facility] OR;  Service: Neurosurgery;  Laterality: N/A;   CHOLECYSTECTOMY  08/02/2011   Procedure: LAPAROSCOPIC CHOLECYSTECTOMY;  Surgeon: Shelly Rubenstein, MD;  Location: [facility] OR;  Service: General;;   COLONOSCOPY  2003   ESOPHAGOGASTRODUODENOSCOPY  2003   ESOPHAGOGASTRODUODENOSCOPY  07/28/2011   Procedure: ESOPHAGOGASTRODUODENOSCOPY (EGD);  Surgeon: [name], MD;  Location: [facility] ENDOSCOPY;  Service: Endoscopy;  Laterality: N/A;   MYRINGOTOMY  1979; 1980   bilaterally   TONSILLECTOMY AND ADENOIDECTOMY  ~ 1980    There were no vitals filed for this visit.    Subjective Assessment - 06/10/21 1620     Subjective Pt presents to PT with reports of roughly 3 week history of R shoulder pain and discomfort. He awoke on 11/12 with severe pain and discomfort in R shoulder, stating it felt that it "locked up". He has a neuromotor disorder called ADEM, which causes significant weakness globally and he states that he gets more frequent "muscle spasms" after no longer using a slow release medicine. He believes that his caused his shoulder to lock up as it has. Pt  also promotes a "burning and numbness" sensation occasionally down R UE and into his R hand, seems to mainly be dependent on position. He is RHD and has struggle being independent with home ADLs and hygiene since initial onset of symptoms. He is interested in having ultrasound done to R UE after discussing it with MD who stated it would be beneficial.    Patient is accompained by: Family member   Darl Pikes   Pertinent History pt presents with 3-4 week hx of R shoulder pain with no MOI    Limitations Lifting;House hold activities;Other (comment)   Sleeping, Reaching   How  long can you sit comfortably? n/a    How long can you stand comfortably? n/a    How long can you walk comfortably? n/a    Patient Stated Goals Pt wants to decrease R shoulder pain in order to improve comfot with home ADLs and sleeping    Currently in Pain? Yes    Pain Score 0-No pain   6/10 at worst in last two weeks   Pain Location Shoulder    Pain Orientation Right    Pain Descriptors / Indicators Sharp    Pain Type Acute pain    Pain Onset 1 to 4 weeks ago    Pain Frequency Intermittent    Aggravating Factors  lifting past 90 deg    Pain Relieving Factors rest, positioning, heat           [facility] Adult PT Treatment/Exercise:   Therapeutic Exercise:  R shoulder table slide flex/abd x 5 ea Seated scap retractions x 5  Manual Therapy: N/A   Neuromuscular re-ed: N/A   Therapeutic Activity: N/A   Modalities: Ultrasound 1.7watts R supraspinatus and R AC joint   Self Care: N/A   Consider / progression for next session:      [facility] PT Assessment - 06/10/21 0001       Assessment   Medical Diagnosis M75.31 (ICD-10-CM) - Calcific tendonitis of right shoulder    Referring Provider (PT) Tarry Kos, MD    Onset Date/Surgical Date 05/17/21    Hand Dominance Right    Prior Therapy yes - for ADEM      Precautions   Precautions None      Restrictions   Weight Bearing Restrictions No      Balance Screen   Has the patient fallen in the past 6 months No    Has the patient had a decrease in activity level because of a fear of falling?  No    Is the patient reluctant to leave their home because of a fear of falling?  No      Prior Function   Level of Independence Independent;Independent with basic ADLs    Vocation On disability      Observation/Other Assessments   Focus on Therapeutic Outcomes (FOTO)  43% function; 61% predicted      ROM / Strength   AROM / PROM / Strength AROM      AROM   Overall AROM Comments L shoulder WNL    Right Shoulder  Flexion 87 Degrees    Right Shoulder ABduction 115 Degrees    Right Shoulder Internal Rotation 90 Degrees    Right Shoulder External Rotation 50 Degrees      Palpation   Palpation comment TTP to R AC joint and supraspinatus  Objective measurements completed on examination: See above findings.                PT Education - 06/10/21 1726     Education Details eval findings, FOTO, HEP, POC    Person(s) Educated Patient    Methods Explanation;Demonstration;Handout    Comprehension Returned demonstration;Verbalized understanding              PT Short Term Goals - 06/10/21 1849       PT SHORT TERM GOAL #1   Title Pt will be compliant and knowledgeable with initial HEP for improved comfort and carryover    Baseline initial HEP given    Time 3    Period Weeks    Status New    Target Date 07/01/21               PT Long Term Goals - 06/10/21 1849       PT LONG TERM GOAL #1   Title Pt will improve FOTO function score to 61% as proxy for functional improvement    Baseline 43% function    Time 8    Period Weeks    Status New    Target Date 08/05/21      PT LONG TERM GOAL #2   Title Pt will self report R shoulder pain no greater than 1-2/10 at worst for improved comfort and function    Baseline 6/10 at worst    Time 8    Period Weeks    Status New    Target Date 08/05/21      PT LONG TERM GOAL #3   Title Pt will improve R shoulder AROM to WNL for flex/abd/ER in order to improve functional ability with ADLs    Baseline see flowsheet    Time 8    Period Weeks    Status New    Target Date 08/05/21                    Plan - 06/10/21 1733     Clinical Impression Statement Pt is a 50 y/o M who presents to PT with roughly 3 week onset of severe R shoulder pain and discomfort. Physical findings are consistent with MD impression, as he has significant TTP to R AC joint and supraspinatus muscle. Due to ADEM  and neuromotor deficits, strength deficits on painful side are difficult to quantify, with pt demonstrating general global weakness. His FOTO score indicates he is operating well below his PLOF and would benefit from skilled PT sevices working on improving R shoulder ROM and decreasing pain.    Personal Factors and Comorbidities Comorbidity 3+;Fitness    Comorbidities PMH: HTN; ADEM, TIA, DM II    Examination-Activity Limitations Lift;Reach Overhead;Hygiene/Grooming;Dressing    Examination-Participation Restrictions Community Activity;Meal Prep;Yard Work    Conservation officer, historic buildings Evolving/Moderate complexity    Clinical Decision Making Moderate    Rehab Potential Good    PT Frequency 2x / week    PT Duration 8 weeks    PT Treatment/Interventions ADLs/Self Care Home Management;Electrical Stimulation;Moist Heat;Ultrasound;Functional mobility training;Therapeutic activities;Therapeutic exercise;Neuromuscular re-education;Patient/family education;Manual techniques;Passive range of motion;Dry needling;Taping;Vasopneumatic Device    PT Next Visit Plan assess response to HEP; progress periscapular strength and R shoulder ROM as able    PT Home Exercise Plan Access Code: [email or web address]    Consulted and Agree with Plan of Care Patient             Patient will benefit from skilled therapeutic intervention in  order to improve the following deficits and impairments:  Abnormal gait, Decreased activity tolerance, Decreased balance, Decreased endurance, Decreased mobility, Decreased range of motion, Decreased strength, Difficulty walking, Pain  Visit Diagnosis: Acute pain of right shoulder  Muscle weakness (generalized)     Problem List Patient Active Problem List   Diagnosis Date Noted   Vitamin D deficiency 06/04/2021   Depression with anxiety 04/13/2019   Class 3 severe obesity with serious comorbidity and body mass index (BMI) of 40.0 to 44.9 in adult (HCC) 12/28/2018   Chronic neck  pain 02/17/2018   Other fatigue 09/15/2017   Shortness of breath on exertion 09/15/2017   Type 2 diabetes mellitus without complication, without long-term current use of insulin (HCC) 09/15/2017   Other hyperlipidemia 09/15/2017   Essential hypertension 09/15/2017   DDD (degenerative disc disease), cervical 05/03/2017   Cervical herniated disc 10/21/2016   Fatty liver 09/22/2016   Spinal stenosis in cervical region 08/05/2016   Diabetes type 2, controlled (HCC) 10/15/2015   Hypertriglyceridemia 10/15/2015   Benign essential HTN 12/05/2014   Macroglossia 09/05/2014   GAD (generalized anxiety disorder) 12/11/2013   Motor tic disorder 12/11/2013   OSA on CPAP 10/04/2013   Hypersomnia with sleep apnea, unspecified 10/04/2013   High frequency hearing loss 08/24/2013   Peripheral neuropathy 04/19/2013   Abnormal gait 10/19/2012   Cervical myelopathy (HCC) 10/19/2012   Esophageal reflux 07/27/2011   Morbid obesity due to excess calories (HCC) 07/22/2011   ADD (attention deficit disorder) 07/22/2011   ADEM (acute disseminated encephalomyelitis) 07/03/2011   Splenomegaly 07/03/2011   Elevated LFTs 07/03/2011    Eloy End, PT 06/10/2021, 6:51 PM  [facility] [address] [place], [place], [address] Phone: [phone]   Fax:  [phone]  Name: Brandon [name] MRN: [id number] Date of Birth: [birth year]

## 2021-06-25 ENCOUNTER — Other Ambulatory Visit: Payer: Self-pay

## 2021-06-25 ENCOUNTER — Ambulatory Visit: Payer: 59

## 2021-06-25 DIAGNOSIS — M6281 Muscle weakness (generalized): Secondary | ICD-10-CM | POA: Diagnosis not present

## 2021-06-25 DIAGNOSIS — M7531 Calcific tendinitis of right shoulder: Secondary | ICD-10-CM | POA: Diagnosis not present

## 2021-06-25 DIAGNOSIS — M25511 Pain in right shoulder: Secondary | ICD-10-CM | POA: Diagnosis not present

## 2021-06-25 NOTE — Therapy (Signed)
[name] [address] [place], [place], [address] Phone: [phone]   Fax:  [phone]  Physical Therapy Treatment  Patient Details  Name: Brandon [name] MRN: [id number] Date of Birth: 05-09-1971 Referring Provider (PT): Tarry Kos, MD   Encounter Date: 06/25/2021   PT End of Session - 06/25/21 1617     Visit Number 2    Number of Visits 17    Date for PT Re-Evaluation 08/05/21    Authorization Type AETNA - FOTO 6th and 6th    Progress Note Due on Visit 10    PT Start Time 1617    PT Stop Time 1657    PT Time Calculation (min) 40 min    Activity Tolerance Patient tolerated treatment well    Behavior During Therapy [facility] for tasks assessed/performed             Past Medical History:  Diagnosis Date   ADEM (acute disseminated encephalomyelitis)    Allergy    Anxiety 2005   Complication of anesthesia    Constipation    Depression 2005   Diabetes mellitus without complication (HCC)    Type 2   Difficult intubation    difficult intubating and exbutating "throat Clinched around tube" during an Endoscopy procedure   Dyspnea    with exertion   Headache(784.0)    Hearing loss    Hyperinsulinemia 07/04/11   "I'm on metformin cause I produce too much insulin"   Hyperlipidemia    Hypertension    Mononucleosis, infectious, with hepatitis 06/2011   MS (multiple sclerosis) (HCC)    patinet has ADEM not MS per Patient   Nausea    occasional per history form 08/18/11   Neuromuscular disorder (HCC)    Pneumonia 2010   Sleep apnea    uses CPAP at home   Stroke [facility]) 2000   TIA   TIA (transient ischemic attack) 2000   denies residual   TIA (transient ischemic attack)     Past Surgical History:  Procedure Laterality Date   ANTERIOR CERVICAL DECOMP/DISCECTOMY FUSION N/A 10/21/2016   Procedure: ANTERIOR CERVICAL DECOMPRESSION/DISCECTOMY FUSION, INTERBODY PROSTHESIS,PLATE CERVICAL FOUR- CERVICAL FIVE, CERVICAL  FIVE- CERVICAL SIX;  Surgeon: Tressie Stalker, MD;  Location: [facility] OR;  Service: Neurosurgery;  Laterality: N/A;   ANTERIOR CERVICAL DECOMP/DISCECTOMY FUSION N/A 05/19/2018   Procedure: ANTERIOR CERVICAL DECOMPRESSION/DISCECTOMY FUSION, INTERBODY PROSTHESIS,PLATE/SCREWS CERVICAL SIX- CERVICAL SEVEN; EXPLORE FUSION;  Surgeon: Tressie Stalker, MD;  Location: [facility] OR;  Service: Neurosurgery;  Laterality: N/A;   CHOLECYSTECTOMY  08/02/2011   Procedure: LAPAROSCOPIC CHOLECYSTECTOMY;  Surgeon: Shelly Rubenstein, MD;  Location: [facility] OR;  Service: General;;   COLONOSCOPY  2003   ESOPHAGOGASTRODUODENOSCOPY  2003   ESOPHAGOGASTRODUODENOSCOPY  07/28/2011   Procedure: ESOPHAGOGASTRODUODENOSCOPY (EGD);  Surgeon: [name], MD;  Location: [facility] ENDOSCOPY;  Service: Endoscopy;  Laterality: N/A;   MYRINGOTOMY  1979; 1980   bilaterally   TONSILLECTOMY AND ADENOIDECTOMY  ~ 1980    There were no vitals filed for this visit.   Subjective Assessment - 06/25/21 1620     Subjective Patient reports the shoulder is feeling significantly better. The stretches are helping. He made a weird movement with his shoulder yesterday and felt a pinch, but that has since subsided. Still having some stiffness.    Currently in Pain? No/denies                [facility] PT Assessment - 06/25/21 0001       AROM  Right Shoulder Flexion 160 Degrees              TREATMENT 06/25/2021:  Therapeutic Exercise: - Pulleys 2 min each flexion and scaption -  supine flexion AAROM 1 x 10  - shoulder ER AAROM with dowel 1 x 10 - supine chest press with dowel 1 x 10  - bilateral shoulder ER yellow band 2 x 10 - updated HEP   Manual Therapy: - Rt shoulder PROM all planes to tolerance - Rt GHJ mobilizations grade III inferior, A/P                          PT Short Term Goals - 06/10/21 1849       PT SHORT TERM GOAL #1   Title Pt will be compliant and knowledgeable with initial HEP for improved comfort and  carryover    Baseline initial HEP given    Time 3    Period Weeks    Status New    Target Date 07/01/21               PT Long Term Goals - 06/10/21 1849       PT LONG TERM GOAL #1   Title Pt will improve FOTO function score to 61% as proxy for functional improvement    Baseline 43% function    Time 8    Period Weeks    Status New    Target Date 08/05/21      PT LONG TERM GOAL #2   Title Pt will self report R shoulder pain no greater than 1-2/10 at worst for improved comfort and function    Baseline 6/10 at worst    Time 8    Period Weeks    Status New    Target Date 08/05/21      PT LONG TERM GOAL #3   Title Pt will improve R shoulder AROM to WNL for flex/abd/ER in order to improve functional ability with ADLs    Baseline see flowsheet    Time 8    Period Weeks    Status New    Target Date 08/05/21                   Plan - 06/25/21 1622     Clinical Impression Statement Patient arrives reporting improvements in his Rt shoulder pain and ROM since initial evaluation. His shoulder flexion AROM has significantly improved compared to initial evaluation. He tolerated manual therapy well with discomfort reported at end range of abduction and ER PROM, which is where he is the most limited currently. Able to progress ROM and periscapular strengthening, reporting minimal discomfort during chest press, otherwise no complaints of pain.    PT Treatment/Interventions ADLs/Self Care Home Management;Electrical Stimulation;Moist Heat;Ultrasound;Functional mobility training;Therapeutic activities;Therapeutic exercise;Neuromuscular re-education;Patient/family education;Manual techniques;Passive range of motion;Dry needling;Taping;Vasopneumatic Device    PT Next Visit Plan assess response to HEP; progress periscapular strength and R shoulder ROM as able    PT Home Exercise Plan Access Code: [email or web address]    Consulted and Agree with Plan of Care Patient             Patient  will benefit from skilled therapeutic intervention in order to improve the following deficits and impairments:  Abnormal gait, Decreased activity tolerance, Decreased balance, Decreased endurance, Decreased mobility, Decreased range of motion, Decreased strength, Difficulty walking, Pain  Visit Diagnosis: Acute pain of right shoulder  Muscle weakness (generalized)     Problem  List Patient Active Problem List   Diagnosis Date Noted   Vitamin D deficiency 06/04/2021   Depression with anxiety 04/13/2019   Class 3 severe obesity with serious comorbidity and body mass index (BMI) of 40.0 to 44.9 in adult (HCC) 12/28/2018   Chronic neck pain 02/17/2018   Other fatigue 09/15/2017   Shortness of breath on exertion 09/15/2017   Type 2 diabetes mellitus without complication, without long-term current use of insulin (HCC) 09/15/2017   Other hyperlipidemia 09/15/2017   Essential hypertension 09/15/2017   DDD (degenerative disc disease), cervical 05/03/2017   Cervical herniated disc 10/21/2016   Fatty liver 09/22/2016   Spinal stenosis in cervical region 08/05/2016   Diabetes type 2, controlled (HCC) 10/15/2015   Hypertriglyceridemia 10/15/2015   Benign essential HTN 12/05/2014   Macroglossia 09/05/2014   GAD (generalized anxiety disorder) 12/11/2013   Motor tic disorder 12/11/2013   OSA on CPAP 10/04/2013   Hypersomnia with sleep apnea, unspecified 10/04/2013   High frequency hearing loss 08/24/2013   Peripheral neuropathy 04/19/2013   Abnormal gait 10/19/2012   Cervical myelopathy (HCC) 10/19/2012   Esophageal reflux 07/27/2011   Morbid obesity due to excess calories (HCC) 07/22/2011   ADD (attention deficit disorder) 07/22/2011   ADEM (acute disseminated encephalomyelitis) 07/03/2011   Splenomegaly 07/03/2011   Elevated LFTs 07/03/2011   Letitia Libra, PT, DPT, ATC 06/25/21 4:59 PM   [facility] [address] [place],  [place], [address] Phone: [phone]   Fax:  [phone]  Name: Brandon [name] MRN: [id number] Date of Birth: Feb 08, 1971

## 2021-07-02 ENCOUNTER — Other Ambulatory Visit: Payer: Self-pay

## 2021-07-02 ENCOUNTER — Encounter: Payer: Self-pay | Admitting: Physical Therapy

## 2021-07-02 ENCOUNTER — Ambulatory Visit: Payer: 59 | Admitting: Physical Therapy

## 2021-07-02 DIAGNOSIS — M25511 Pain in right shoulder: Secondary | ICD-10-CM | POA: Diagnosis not present

## 2021-07-02 DIAGNOSIS — M6281 Muscle weakness (generalized): Secondary | ICD-10-CM

## 2021-07-02 DIAGNOSIS — M7531 Calcific tendinitis of right shoulder: Secondary | ICD-10-CM | POA: Diagnosis not present

## 2021-07-02 NOTE — Therapy (Signed)
[facility] [address] [place], [place], [address] Phone: [phone]   Fax:  [phone]  Physical Therapy Treatment  Patient Details  Name: Brandon [name] MRN: [id number] Date of Birth: 22-Apr-1971 Referring Provider (PT): Tarry Kos, MD   Encounter Date: 07/02/2021   PT End of Session - 07/02/21 1542     Visit Number 3    Number of Visits 17    Date for PT Re-Evaluation 08/05/21    Authorization Type AETNA - FOTO 6th and 6th    Progress Note Due on Visit 10    PT Start Time 0330    PT Stop Time 0410    PT Time Calculation (min) 40 min             Past Medical History:  Diagnosis Date   ADEM (acute disseminated encephalomyelitis)    Allergy    Anxiety 2005   Complication of anesthesia    Constipation    Depression 2005   Diabetes mellitus without complication (HCC)    Type 2   Difficult intubation    difficult intubating and exbutating "throat Clinched around tube" during an Endoscopy procedure   Dyspnea    with exertion   Headache(784.0)    Hearing loss    Hyperinsulinemia 07/04/11   "I'm on metformin cause I produce too much insulin"   Hyperlipidemia    Hypertension    Mononucleosis, infectious, with hepatitis 06/2011   MS (multiple sclerosis) (HCC)    patinet has ADEM not MS per Patient   Nausea    occasional per history form 08/18/11   Neuromuscular disorder (HCC)    Pneumonia 2010   Sleep apnea    uses CPAP at home   Stroke [facility]) 2000   TIA   TIA (transient ischemic attack) 2000   denies residual   TIA (transient ischemic attack)     Past Surgical History:  Procedure Laterality Date   ANTERIOR CERVICAL DECOMP/DISCECTOMY FUSION N/A 10/21/2016   Procedure: ANTERIOR CERVICAL DECOMPRESSION/DISCECTOMY FUSION, INTERBODY PROSTHESIS,PLATE CERVICAL FOUR- CERVICAL FIVE, CERVICAL FIVE- CERVICAL SIX;  Surgeon: Tressie Stalker, MD;  Location: [facility] OR;  Service: Neurosurgery;  Laterality: N/A;    ANTERIOR CERVICAL DECOMP/DISCECTOMY FUSION N/A 05/19/2018   Procedure: ANTERIOR CERVICAL DECOMPRESSION/DISCECTOMY FUSION, INTERBODY PROSTHESIS,PLATE/SCREWS CERVICAL SIX- CERVICAL SEVEN; EXPLORE FUSION;  Surgeon: Tressie Stalker, MD;  Location: [facility] OR;  Service: Neurosurgery;  Laterality: N/A;   CHOLECYSTECTOMY  08/02/2011   Procedure: LAPAROSCOPIC CHOLECYSTECTOMY;  Surgeon: Shelly Rubenstein, MD;  Location: [facility] OR;  Service: General;;   COLONOSCOPY  2003   ESOPHAGOGASTRODUODENOSCOPY  2003   ESOPHAGOGASTRODUODENOSCOPY  07/28/2011   Procedure: ESOPHAGOGASTRODUODENOSCOPY (EGD);  Surgeon: [name], MD;  Location: [facility] ENDOSCOPY;  Service: Endoscopy;  Laterality: N/A;   MYRINGOTOMY  1979; 1980   bilaterally   TONSILLECTOMY AND ADENOIDECTOMY  ~ 1980    There were no vitals filed for this visit.   Subjective Assessment - 07/02/21 1533     Subjective The shoulder mobility is mostly back. Significantly better. I am curious of the calcification is still there.    Currently in Pain? No/denies    Aggravating Factors  nothing anymore, just a little stiffness.                [facility] PT Assessment - 07/02/21 0001       AROM   Right Shoulder Flexion 150 Degrees    Right Shoulder ABduction 145 Degrees    Right Shoulder External Rotation 65 Degrees  Treatment 07/02/21  Therapeutic exercise: -UBE level 2 2 minutes each way -pulleys flexion and scaption x 2 min each  -standing shoulder row green band x 20 -- wall slides flexion and scaption x 10 each  -bilateral shoulder ER red band  x 20 -supine flexion AAROM 1 x 10  - shoulder ER AAROM with dowel 1 x 10 - supine chest press with dowel 1 x 10  Manual Therapy: - Rt shoulder PROM all planes to tolerance - Rt GHJ mobilizations grade III inferior, A/P   TREATMENT 06/25/2021:  Therapeutic Exercise: - Pulleys 2 min each flexion and scaption -  supine flexion AAROM 1 x 10  - shoulder ER AAROM with dowel 1 x 10 - supine  chest press with dowel 1 x 10  - bilateral shoulder ER yellow band 2 x 10 - updated HEP   Manual Therapy: - Rt shoulder PROM all planes to tolerance - Rt GHJ mobilizations grade III inferior, A/P          PT Short Term Goals - 06/10/21 1849       PT SHORT TERM GOAL #1   Title Pt will be compliant and knowledgeable with initial HEP for improved comfort and carryover    Baseline initial HEP given    Time 3    Period Weeks    Status New    Target Date 07/01/21               PT Long Term Goals - 06/10/21 1849       PT LONG TERM GOAL #1   Title Pt will improve FOTO function score to 61% as proxy for functional improvement    Baseline 43% function    Time 8    Period Weeks    Status New    Target Date 08/05/21      PT LONG TERM GOAL #2   Title Pt will self report R shoulder pain no greater than 1-2/10 at worst for improved comfort and function    Baseline 6/10 at worst    Time 8    Period Weeks    Status New    Target Date 08/05/21      PT LONG TERM GOAL #3   Title Pt will improve R shoulder AROM to WNL for flex/abd/ER in order to improve functional ability with ADLs    Baseline see flowsheet    Time 8    Period Weeks    Status New    Target Date 08/05/21                   Plan - 07/02/21 1536     Clinical Impression Statement Pt reports continued improvement and min stiffness. He reports that he can now cross his arm without increased pain in shoulder. His AROM has improved. Progressed with standing AAROM  and scapular stabilization. He reported feeling generally more sore with treatment today.    PT Treatment/Interventions ADLs/Self Care Home Management;Electrical Stimulation;Moist Heat;Ultrasound;Functional mobility training;Therapeutic activities;Therapeutic exercise;Neuromuscular re-education;Patient/family education;Manual techniques;Passive range of motion;Dry needling;Taping;Vasopneumatic Device    PT Next Visit Plan assess response to HEP;  progress periscapular strength and R shoulder ROM as able    PT Home Exercise Plan Access Code: [email or web address]             Patient will benefit from skilled therapeutic intervention in order to improve the following deficits and impairments:  Abnormal gait, Decreased activity tolerance, Decreased balance, Decreased endurance, Decreased mobility, Decreased range of motion, Decreased  strength, Difficulty walking, Pain  Visit Diagnosis: No diagnosis found.     Problem List Patient Active Problem List   Diagnosis Date Noted   Vitamin D deficiency 06/04/2021   Depression with anxiety 04/13/2019   Class 3 severe obesity with serious comorbidity and body mass index (BMI) of 40.0 to 44.9 in adult ([place]) 12/28/2018   Chronic neck pain 02/17/2018   Other fatigue 09/15/2017   Shortness of breath on exertion 09/15/2017   Type 2 diabetes mellitus without complication, without long-term current use of insulin ([place]) 09/15/2017   Other hyperlipidemia 09/15/2017   Essential hypertension 09/15/2017   DDD (degenerative disc disease), cervical 05/03/2017   Cervical herniated disc 10/21/2016   Fatty liver 09/22/2016   Spinal stenosis in cervical region 08/05/2016   Diabetes type 2, controlled ([place]) 10/15/2015   Hypertriglyceridemia 10/15/2015   Benign essential HTN 12/05/2014   Macroglossia 09/05/2014   GAD (generalized anxiety disorder) 12/11/2013   Motor tic disorder 12/11/2013   OSA on CPAP 10/04/2013   Hypersomnia with sleep apnea, unspecified 10/04/2013   High frequency hearing loss 08/24/2013   Peripheral neuropathy 04/19/2013   Abnormal gait 10/19/2012   Cervical myelopathy ([place]) 10/19/2012   Esophageal reflux 07/27/2011   Morbid obesity due to excess calories ([place]) 07/22/2011   ADD (attention deficit disorder) 07/22/2011   ADEM (acute disseminated encephalomyelitis) 07/03/2011   Splenomegaly 07/03/2011   Elevated LFTs 07/03/2011    Dorene Ar, PTA 07/02/2021, 4:08  PM  [place] [facility] [address] [place], [place], [address] Phone: [phone]   Fax:  [phone]  Name: PAYCE DIETZLER MRN: [id number] Date of Birth: 16-Jun-1971

## 2021-07-09 ENCOUNTER — Ambulatory Visit (INDEPENDENT_AMBULATORY_CARE_PROVIDER_SITE_OTHER): Payer: Medicare Other | Admitting: Family Medicine

## 2021-07-09 ENCOUNTER — Other Ambulatory Visit: Payer: Self-pay

## 2021-07-09 ENCOUNTER — Encounter (INDEPENDENT_AMBULATORY_CARE_PROVIDER_SITE_OTHER): Payer: Self-pay | Admitting: Family Medicine

## 2021-07-09 ENCOUNTER — Ambulatory Visit: Payer: 59 | Attending: Orthopaedic Surgery

## 2021-07-09 VITALS — BP 117/75 | Temp 98.3°F | Ht 68.0 in | Wt 291.0 lb

## 2021-07-09 DIAGNOSIS — M25511 Pain in right shoulder: Secondary | ICD-10-CM | POA: Diagnosis not present

## 2021-07-09 DIAGNOSIS — M6281 Muscle weakness (generalized): Secondary | ICD-10-CM | POA: Insufficient documentation

## 2021-07-09 DIAGNOSIS — E559 Vitamin D deficiency, unspecified: Secondary | ICD-10-CM | POA: Diagnosis not present

## 2021-07-09 DIAGNOSIS — E1169 Type 2 diabetes mellitus with other specified complication: Secondary | ICD-10-CM

## 2021-07-09 DIAGNOSIS — Z6841 Body Mass Index (BMI) 40.0 and over, adult: Secondary | ICD-10-CM | POA: Diagnosis not present

## 2021-07-09 MED ORDER — VITAMIN D (ERGOCALCIFEROL) 1.25 MG (50000 UNIT) PO CAPS: ORAL_CAPSULE | ORAL | 0 refills | Status: DC

## 2021-07-09 NOTE — Therapy (Addendum)
[place], [place], [address] Phone: [phone]   Fax:  [phone]  Physical Therapy Treatment/Discharge  Patient Details  Name: Brandon [name] MRN: [id number] Date of Birth: [birth year] Referring Provider (PT): Leandrew Koyanagi, MD   Encounter Date: 07/09/2021   PT End of Session - 07/09/21 1616     Visit Number 4    Number of Visits 17    Date for PT Re-Evaluation 08/05/21    Authorization Type AET[employer] - FOTO 6th and 6th    Progress Note Due on Visit 10    PT Start Time 1616    PT Stop Time 0092    PT Time Calculation (min) 25 min    Activity Tolerance Patient tolerated treatment well    Behavior During Therapy [facility] for tasks assessed/performed             Past Medical History:  Diagnosis Date   ADEM (acute disseminated encephalomyelitis)    Allergy    Anxiety 3300   Complication of anesthesia    Constipation    Depression 2005   Diabetes mellitus without complication ([place])    Type 2   Difficult intubation    difficult intubating and exbutating "throat Clinched around tube" during an Endoscopy procedure   Dyspnea    with exertion   Headache(784.0)    Hearing loss    Hyperinsulinemia 07/04/11   "I'm on metformin cause I produce too much insulin"   Hyperlipidemia    Hypertension    Mononucleosis, infectious, with hepatitis 06/2011   MS (multiple sclerosis) ([place])    patinet has ADEM not MS per Patient   Nausea    occasional per history form 08/18/11   Neuromuscular disorder ([place])    Pneumonia 2010   Sleep apnea    uses CPAP at home   Stroke [facility]) 2000   TIA   TIA (transient ischemic attack) 2000   denies residual   TIA (transient ischemic attack)     Past Surgical History:  Procedure Laterality Date   ANTERIOR CERVICAL DECOMP/DISCECTOMY FUSION N/A 10/21/2016   Procedure: ANTERIOR CERVICAL DECOMPRESSION/DISCECTOMY FUSION, INTERBODY PROSTHESIS,PLATE CERVICAL FOUR- CERVICAL FIVE,  CERVICAL FIVE- CERVICAL SIX;  Surgeon: Newman Pies, MD;  Location: [place];  Service: Neurosurgery;  Laterality: N/A;   ANTERIOR CERVICAL DECOMP/DISCECTOMY FUSION N/A 05/19/2018   Procedure: ANTERIOR CERVICAL DECOMPRESSION/DISCECTOMY FUSION, INTERBODY PROSTHESIS,PLATE/SCREWS CERVICAL SIX- CERVICAL SEVEN; EXPLORE FUSION;  Surgeon: Newman Pies, MD;  Location: [place];  Service: Neurosurgery;  Laterality: N/A;   CHOLECYSTECTOMY  08/02/2011   Procedure: LAPAROSCOPIC CHOLECYSTECTOMY;  Surgeon: Harl Bowie, MD;  Location: [place];  Service: General;;   COLONOSCOPY  2003   ESOPHAGOGASTRODUODENOSCOPY  2003   ESOPHAGOGASTRODUODENOSCOPY  07/28/2011   Procedure: ESOPHAGOGASTRODUODENOSCOPY (EGD);  Surgeon: Scarlette Shorts, MD;  Location[facility] ENDOSCOPY;  Service: Endoscopy;  Laterality: N/A;   MYRINGOTOMY  1979; 1980   bilaterally   TONSILLECTOMY AND ADENOIDECTOMY  ~ 1980    There were no vitals filed for this visit.   Subjective Assessment - 07/09/21 1617     Subjective Patient reports his shoulder motion is mostly back to normal and only has a few motions that feel limited, but the exercises he is doing at home are working. He feels that he is ready for discharge at this time and can continue with his HEP independently. He reports the pain in the shoulder for the most part is "nonexistent."    Currently in Pain? No/denies  TREATMENT 07/09/2021:  Therapeutic Exercise: - Reviewed and updated HEP.  - pec doorway stretch 30 sec - shoulder crossover stretch 30 sec - shoulder doorway ER stretch 30 sec   Therapeutic Activity: - Education on re-assessment findings and progress towards goals. D/C education.              [facility] PT Assessment - 07/09/21 0001       Observation/Other Assessments   Focus on Therapeutic Outcomes (FOTO)  74% function      AROM   Overall AROM Comments pain free    Right Shoulder Flexion 165 Degrees    Right Shoulder ABduction 163 Degrees     Right Shoulder Internal Rotation 90 Degrees    Right Shoulder External Rotation 65 Degrees                                   PT Short Term Goals - 07/09/21 1622       PT SHORT TERM GOAL #1   Title Pt will be compliant and knowledgeable with initial HEP for improved comfort and carryover    Baseline initial HEP given    Time 3    Period Weeks    Status Achieved    Target Date 07/01/21               PT Long Term Goals - 07/09/21 1622       PT LONG TERM GOAL #1   Title Pt will improve FOTO function score to 61% as proxy for functional improvement    Baseline see flowsheet    Time 8    Period Weeks    Status Achieved    Target Date 08/05/21      PT LONG TERM GOAL #2   Title Pt will self report R shoulder pain no greater than 1-2/10 at worst for improved comfort and function    Baseline 3/10 at worst, but reports this is very short-lived; 07/09/21    Time 8    Period Weeks    Status --   nearly met   Target Date 08/05/21      PT LONG TERM GOAL #3   Title Pt will improve R shoulder AROM to WNL for flex/abd/ER in order to improve functional ability with ADLs    Baseline see flowsheet    Time 8    Period Weeks    Status Achieved    Target Date 08/05/21                   Plan - 07/09/21 1629     Clinical Impression Statement Patient has made excellent functional progress since the start of care with significant improvements in his Rt shoulder ROM and overall reduction in pain. He has met all established functional goals and feels that he can continue with his HEP independently to further progress his ROM and strength. He is therefore appropriate for D/C at this time.    PT Treatment/Interventions ADLs/Self Care Home Management;Electrical Stimulation;Moist Heat;Ultrasound;Functional mobility training;Therapeutic activities;Therapeutic exercise;Neuromuscular re-education;Patient/family education;Manual techniques;Passive range of motion;Dry  needling;Taping;Vasopneumatic Device    PT Home Exercise Plan Access Code: [email or web address]    Consulted and Agree with Plan of Care Patient             Patient will benefit from skilled therapeutic intervention in order to improve the following deficits and impairments:  Abnormal gait, Decreased activity tolerance, Decreased balance, Decreased endurance, Decreased mobility,  Decreased range of motion, Decreased strength, Difficulty walking, Pain  Visit Diagnosis: Acute pain of right shoulder  Muscle weakness (generalized)     Problem List Patient Active Problem List   Diagnosis Date Noted   Vitamin D deficiency 06/04/2021   Depression with anxiety 04/13/2019   Class 3 severe obesity with serious comorbidity and body mass index (BMI) of 40.0 to 44.9 in adult ([place]) 12/28/2018   Chronic neck pain 02/17/2018   Other fatigue 09/15/2017   Shortness of breath on exertion 09/15/2017   Type 2 diabetes mellitus without complication, without long-term current use of insulin ([place]) 09/15/2017   Other hyperlipidemia 09/15/2017   Essential hypertension 09/15/2017   DDD (degenerative disc disease), cervical 05/03/2017   Cervical herniated disc 10/21/2016   Fatty liver 09/22/2016   Spinal stenosis in cervical region 08/05/2016   Diabetes type 2, controlled ([place]) 10/15/2015   Hypertriglyceridemia 10/15/2015   Benign essential HTN 12/05/2014   Macroglossia 09/05/2014   GAD (generalized anxiety disorder) 12/11/2013   Motor tic disorder 12/11/2013   OSA on CPAP 10/04/2013   Hypersomnia with sleep apnea, unspecified 10/04/2013   High frequency hearing loss 08/24/2013   Peripheral neuropathy 04/19/2013   Abnormal gait 10/19/2012   Cervical myelopathy ([place]) 10/19/2012   Esophageal reflux 07/27/2011   Morbid obesity due to excess calories ([place]) 07/22/2011   ADD (attention deficit disorder) 07/22/2011   ADEM (acute disseminated encephalomyelitis) 07/03/2011   Splenomegaly 07/03/2011   Elevated  LFTs 07/03/2011   PHYSICAL THERAPY DISCHARGE SUMMARY  Visits from Start of Care: 4  Current functional level related to goals / functional outcomes: See goals above   Remaining deficits: N/A   Education / Equipment: See Treatment above   Patient agrees to discharge. Patient goals were met. Patient is being discharged due to meeting the stated rehab goals.  Brandon [name], PT, DPT, ATC 07/09/21 4:47 PM  [facility] [address] [place], [place], [address] Phone: [phone]   Fax:  [phone]  Name: Brandon [name] MRN: [id number] Date of Birth: 1970-12-20

## 2021-07-10 NOTE — Progress Notes (Signed)
Chief Complaint:   OBESITY Sirr is here to discuss his progress with his obesity treatment plan along with follow-up of his obesity related diagnoses. Everet is on the Category 4 Plan and states he is following his eating plan approximately 30% of the time. Seneca states he is walking for 5-10 minutes 5 times per week.  Today's visit was #: 64 Starting weight: 305 lbs Starting date: 09/15/2017 Today's weight: 291 lbs Today's date: 07/09/2021 Total lbs lost to date: 14 Total lbs lost since last in-office visit: 3  Interim History: Bartolome has done especially well controlling his stress eating even over Christmas. He has been working on increasing his activity recently, and he is considering starting back to the gym.  Subjective:   1. Type 2 diabetes mellitus with other specified complication, without long-term current use of insulin (HCC) Kevonne is on metformin and Victoza, and he is doing better with his diet and weight loss. He did better with taking his Victoza for 3 weeks but he is struggling to remember again.  2. Vitamin D deficiency Kaseem is stable on Vit D with no side effects noted.  Assessment/Plan:   1. Type 2 diabetes mellitus with other specified complication, without long-term current use of insulin (HCC) Zelmer will continue metformin and Victoza, and he will work on taking both regularly. Good blood sugar control is important to decrease the likelihood of diabetic complications such as nephropathy, neuropathy, limb loss, blindness, coronary artery disease, and death. Intensive lifestyle modification including diet, exercise and weight loss are the first line of treatment for diabetes.   2. Vitamin D deficiency We will refill prescription Vitamin D for 1 month Rafeal will follow-up for routine testing of Vitamin D, at least 2-3 times per year to avoid over-replacement.  - Vitamin D, Ergocalciferol, (DRISDOL) 1.25 MG (50000 UNIT) CAPS capsule; TAKE 1 CAPSULE  BY MOUTH EVERY 3 DAYS  Dispense: 10 capsule; Refill: 0  3. Obesity with current BMI of 44.2 Cyncere is currently in the action stage of change. As such, his goal is to continue with weight loss efforts. He has agreed to the Category 4 Plan.   Exercise goals: All adults should avoid inactivity. Some physical activity is better than none, and adults who participate in any amount of physical activity gain some health benefits.  Behavioral modification strategies: decreasing simple carbohydrates and meal planning and cooking strategies.  Shingo has agreed to follow-up with our clinic in 4 weeks. He was informed of the importance of frequent follow-up visits to maximize his success with intensive lifestyle modifications for his multiple health conditions.   Objective:   Blood pressure 117/75, temperature 98.3 F (36.8 C), height 5\' 8"  (1.727 m), weight 291 lb (132 kg). Body mass index is 44.25 kg/m.  General: Cooperative, alert, well developed, in no acute distress. HEENT: Conjunctivae and lids unremarkable. Cardiovascular: Regular rhythm.  Lungs: Normal work of breathing. Neurologic: No focal deficits.   Lab Results  Component Value Date   CREATININE 0.85 06/04/2021   BUN 16 06/04/2021   NA 141 06/04/2021   K 3.2 (L) 06/04/2021   CL 97 06/04/2021   CO2 27 06/04/2021   Lab Results  Component Value Date   ALT 53 (H) 06/04/2021   AST 26 06/04/2021   ALKPHOS 64 06/04/2021   BILITOT 0.5 06/04/2021   Lab Results  Component Value Date   HGBA1C 7.7 (H) 06/04/2021   HGBA1C 7.6 (H) 12/25/2020   HGBA1C 6.7 (H) 08/06/2020  HGBA1C 5.8 (H) 01/03/2020   HGBA1C 6.1 (H) 09/13/2019   Lab Results  Component Value Date   INSULIN 126.0 (H) 06/04/2021   INSULIN 185.0 (H) 08/06/2020   INSULIN 83.4 (H) 01/03/2020   INSULIN 48.4 (H) 09/13/2019   INSULIN 94.0 (H) 05/15/2019   Lab Results  Component Value Date   TSH 2.200 09/26/2018   Lab Results  Component Value Date   CHOL 108  06/04/2021   HDL 33 (L) 06/04/2021   LDLCALC 32 06/04/2021   TRIG 285 (H) 06/04/2021   CHOLHDL 3.4 08/24/2017   Lab Results  Component Value Date   VD25OH 42.6 06/04/2021   VD25OH 36.6 12/25/2020   VD25OH 38.1 08/06/2020   Lab Results  Component Value Date   WBC 6.0 05/15/2019   HGB 17.3 05/15/2019   HCT 51.1 (H) 05/15/2019   MCV 81 05/15/2019   PLT 163 05/15/2019   No results found for: IRON, TIBC, FERRITIN  Obesity Behavioral Intervention:   Approximately 15 minutes were spent on the discussion below.  ASK: We discussed the diagnosis of obesity with Legrand Como today and Levone agreed to give [place] permission to discuss obesity behavioral modification therapy today.  ASSESS: Haiden has the diagnosis of obesity and his BMI today is 44.2. Shellie is in the action stage of change.   ADVISE: Sargent was educated on the multiple health risks of obesity as well as the benefit of weight loss to improve his health. He was advised of the need for long term treatment and the importance of lifestyle modifications to improve his current health and to decrease his risk of future health problems.  AGREE: Multiple dietary modification options and treatment options were discussed and Jhair agreed to follow the recommendations documented in the above note.  ARRANGE: Prentiss was educated on the importance of frequent visits to treat obesity as outlined per CMS and USPSTF guidelines and agreed to schedule his next follow up appointment today.  Attestation Statements:   Reviewed by clinician on day of visit: allergies, medications, problem list, medical history, surgical history, family history, social history, and previous encounter notes.   I, Trixie Dredge, am acting as transcriptionist for Dennard Nip, MD.  I have reviewed the above documentation for accuracy and completeness, and I agree with the above. -  Dennard Nip, MD

## 2021-07-16 ENCOUNTER — Ambulatory Visit: Payer: 59

## 2021-07-22 DIAGNOSIS — E1142 Type 2 diabetes mellitus with diabetic polyneuropathy: Secondary | ICD-10-CM | POA: Diagnosis not present

## 2021-07-22 DIAGNOSIS — L859 Epidermal thickening, unspecified: Secondary | ICD-10-CM | POA: Diagnosis not present

## 2021-07-22 DIAGNOSIS — R234 Changes in skin texture: Secondary | ICD-10-CM | POA: Diagnosis not present

## 2021-08-06 ENCOUNTER — Encounter: Payer: Self-pay | Admitting: Adult Health

## 2021-08-06 ENCOUNTER — Ambulatory Visit (INDEPENDENT_AMBULATORY_CARE_PROVIDER_SITE_OTHER): Payer: 59 | Admitting: Adult Health

## 2021-08-06 VITALS — BP 126/70 | HR 88 | Ht 68.0 in | Wt 294.8 lb

## 2021-08-06 DIAGNOSIS — G4733 Obstructive sleep apnea (adult) (pediatric): Secondary | ICD-10-CM | POA: Diagnosis not present

## 2021-08-06 DIAGNOSIS — Z9989 Dependence on other enabling machines and devices: Secondary | ICD-10-CM | POA: Diagnosis not present

## 2021-08-06 NOTE — Addendum Note (Signed)
Addended by: Trudie Buckler on: 08/06/2021 09:17 AM   Modules accepted: Orders

## 2021-08-06 NOTE — Patient Instructions (Signed)
Continue using CPAP nightly and greater than 4 hours each night Home sleep test ordered- pending results will order new machine If your symptoms worsen or you develop new symptoms please let us know.

## 2021-08-06 NOTE — Progress Notes (Signed)
PATIENT: Brandon [name] DOB: 20-Apr-1971  REASON FOR VISIT: follow up HISTORY FROM: patient  HISTORY OF PRESENT ILLNESS: Today 08/06/21:  [name] is a 51 year old male with a history of obstructive sleep apnea on CPAP.  He returns today for follow-up.  He reports that the CPAP is working well for him.  He would like a new machine.  He returns today for an evaluation.  08/06/20: Brandon [name] is a 51 year old male with a history of obstructive sleep apnea on CPAP.  He reports that the CPAP continues to work well for him.  He denies any new issues.  Reports that he tries to change out his supplies regularly.  His download is below:  Compliance Report Usage 07/07/2020 - 08/05/2020 Usage days 29/30 days (97%) >= 4 hours 22 days (73%) Average usage (days used) 5 hours 56 minutes  AirSense 10 AutoSet Serial number [id number] Mode AutoSet Min Pressure 5 cmH2O Max Pressure 18 cmH2O EPR Fulltime EPR level 3  Therapy Pressure - cmH2O Median: 12.7 95th percentile: 17.4 Maximum: 17.7 Leaks - L/min Median: 1.5 95th percentile: 20.4 Maximum: 40.4 Events per hour AI: 1.5 HI: 0.6 AHI: 2.1 Apnea Index Central: 0.0 Obstructive: 1.4 Unknown: 0.1  HISTORY 05/16/19:   Brandon [name] is a 51 year old male with a history of obstructive sleep apnea on CPAP.  His download indicates that he use his machine nightly for compliance of 100%.  He uses machine greater than 4 hours 81 out of 90 days for compliance of 90%.  On average he uses his machine 8 hours and 2 minutes.  His residual AHI is 1.4 on 5 to 18 cm water with EPR 3.  His leak in the 95th percentile is 25.8 L/min.  He reports that the CPAP is working well for him.  He returns today for an evaluation.He reports CPAP is working well for him.   REVIEW OF SYSTEMS: Out of a complete 14 system review of symptoms, the patient complains only of the following symptoms, and all other reviewed systems are negative.   ESS 4  ALLERGIES: Allergies  Allergen  Reactions   Amoxicillin Anaphylaxis    Has patient had a PCN reaction causing immediate rash, facial/tongue/throat swelling, SOB or lightheadedness with hypotension: Yes Has patient had a PCN reaction causing severe rash involving mucus membranes or skin necrosis: No Has patient had a PCN reaction that required hospitalization: No Has patient had a PCN reaction occurring within the last 10 years: No If all of the above answers are "NO", then may proceed with Cephalosporin use.    Penicillin G Anaphylaxis    Gets extremely high fevers. Has patient had a PCN reaction causing immediate rash, facial/tongue/throat swelling, SOB or lightheadedness with hypotension: Yes Has patient had a PCN reaction causing severe rash involving mucus membranes or skin necrosis: No Has patient had a PCN reaction that required hospitalization: No Has patient had a PCN reaction occurring within the last 10 years: No If all of the above answers are "NO", then may proceed with Cephalosporin use.    Sulfa Antibiotics Other (See Comments)    It will kill him   Cephalosporins Other (See Comments)    Reaction unspecified.   Quinolones Other (See Comments)    Reaction unspecified   Sulfamethoxazole Swelling    HOME MEDICATIONS: Outpatient Medications Prior to Visit  Medication Sig Dispense Refill   atorvastatin (LIPITOR) 20 MG tablet TAKE 1 TABLET (20 MG TOTAL) BY MOUTH AT BEDTIME. (Patient taking differently: Take 20  mg by mouth at bedtime.) 30 tablet 0   chlorthalidone (HYGROTON) 25 MG tablet Take 25 mg by mouth daily.     colesevelam (WELCHOL) 625 MG tablet Take 3 tablets (1,875 mg total) by mouth daily with breakfast. 540 tablet 1   cyclobenzaprine (AMRIX) 15 MG 24 hr capsule Take 15 mg by mouth 2 (two) times daily.     gabapentin (NEURONTIN) 300 MG capsule Take 3 capsules (900 mg total) by mouth 2 (two) times daily. 540 capsule 1   Insulin Pen Needle (BD PEN NEEDLE NANO U/F) 32G X 4 MM MISC 1 Package by Does  not apply route 2 (two) times daily. 100 each 0   lidocaine (LIDODERM) 5 % Place 1 patch onto the skin daily. Remove & Discard patch within 12 hours or as directed by MD 30 patch 0   liraglutide (VICTOZA) 18 MG/3ML SOPN Inject 1.2 mg into the skin every morning. 6 mL 0   metFORMIN (GLUCOPHAGE-XR) 500 MG 24 hr tablet TAKE 2 TABLETS (1,000 MG TOTAL) BY MOUTH 2 (TWO) TIMES DAILY. (Patient taking differently: Take 1,000 mg by mouth 2 (two) times daily. TAKE 2 TABLETS (1,000 MG TOTAL) BY MOUTH 2 (TWO) TIMES DAILY.) 360 tablet 1   NON FORMULARY CPAP machine at bedtime & sleep     Vitamin D, Ergocalciferol, (DRISDOL) 1.25 MG (50000 UNIT) CAPS capsule TAKE 1 CAPSULE BY MOUTH EVERY 3 DAYS 10 capsule 0   No facility-administered medications prior to visit.    PAST MEDICAL HISTORY: Past Medical History:  Diagnosis Date   ADEM (acute disseminated encephalomyelitis)    Allergy    Anxiety 2005   Complication of anesthesia    Constipation    Depression 2005   Diabetes mellitus without complication (HCC)    Type 2   Difficult intubation    difficult intubating and exbutating "throat Clinched around tube" during an Endoscopy procedure   Dyspnea    with exertion   Headache(784.0)    Hearing loss    Hyperinsulinemia 07/04/11   "I'm on metformin cause I produce too much insulin"   Hyperlipidemia    Hypertension    Mononucleosis, infectious, with hepatitis 06/2011   MS (multiple sclerosis) (HCC)    patinet has ADEM not MS per Patient   Nausea    occasional per history form 08/18/11   Neuromuscular disorder (HCC)    Pneumonia 2010   Sleep apnea    uses CPAP at home   Stroke [name]) 2000   TIA   TIA (transient ischemic attack) 2000   denies residual   TIA (transient ischemic attack)     PAST SURGICAL HISTORY: Past Surgical History:  Procedure Laterality Date   ANTERIOR CERVICAL DECOMP/DISCECTOMY FUSION N/A 10/21/2016   Procedure: ANTERIOR CERVICAL DECOMPRESSION/DISCECTOMY FUSION, INTERBODY  PROSTHESIS,PLATE CERVICAL FOUR- CERVICAL FIVE, CERVICAL FIVE- CERVICAL SIX;  Surgeon: Tressie Stalker, MD;  Location: [facility] OR;  Service: Neurosurgery;  Laterality: N/A;   ANTERIOR CERVICAL DECOMP/DISCECTOMY FUSION N/A 05/19/2018   Procedure: ANTERIOR CERVICAL DECOMPRESSION/DISCECTOMY FUSION, INTERBODY PROSTHESIS,PLATE/SCREWS CERVICAL SIX- CERVICAL SEVEN; EXPLORE FUSION;  Surgeon: Tressie Stalker, MD;  Location: [facility] OR;  Service: Neurosurgery;  Laterality: N/A;   CHOLECYSTECTOMY  08/02/2011   Procedure: LAPAROSCOPIC CHOLECYSTECTOMY;  Surgeon: Shelly Rubenstein, MD;  Location[facility] OR;  Service: General;;   COLONOSCOPY  2003   ESOPHAGOGASTRODUODENOSCOPY  2003   ESOPHAGOGASTRODUODENOSCOPY  07/28/2011   Procedure: ESOPHAGOGASTRODUODENOSCOPY (EGD);  Surgeon: [name], MD;  Location: [facility] ENDOSCOPY;  Service: Endoscopy;  Laterality: N/A;   MYRINGOTOMY  1979; 1980   bilaterally   TONSILLECTOMY AND ADENOIDECTOMY  ~ 1980    FAMILY HISTORY: Family History  Problem Relation Age of Onset   Diabetes Mother    Obesity Mother    Coronary artery disease Father    Stroke Father    Cancer Father    High blood pressure Father    Alcoholism Father    Cancer Maternal Grandmother        bone   Cancer Other        Stomach -Great-great grandmother   Sleep apnea Neg Hx     SOCIAL HISTORY: Social History   Socioeconomic History   Marital status: Single    Spouse name: Not on file   Number of children: 0   Years of education: college   Highest education level: Not on file  Occupational History   Occupation: disabled   Tobacco Use   Smoking status: Former    Packs/day: 3.00    Years: 3.00    Pack years: 9.00    Types: Cigarettes    Quit date: 08/18/1987    Years since quitting: 33.9   Smokeless tobacco: Never  Vaping Use   Vaping Use: Never used  Substance and Sexual Activity   Alcohol use: Not Currently    Alcohol/week: 0.0 standard drinks    Comment: 07/04/11 "glass of wine or spirits once a  month"   Drug use: Not Currently    Types: Marijuana    Comment: have not used in a 1.5 years   Sexual activity: Yes    Partners: Female  Other Topics Concern   Not on file  Social History Narrative   Lives in [place] with 2 domestic male partners "his family unit"   Caffeine: minimal   Social Determinants of Health   Financial Resource Strain: Not on file  Food Insecurity: Not on file  Transportation Needs: Not on file  Physical Activity: Not on file  Stress: Not on file  Social Connections: Not on file  Intimate Partner Violence: Not on file      PHYSICAL EXAM  Vitals:   08/06/21 0834  BP: 126/70  Pulse: 88  Weight: 294 lb 12.8 oz (133.7 kg)  Height: 5\' 8"  (1.727 m)   Body mass index is 44.82 kg/m.  Generalized: Well developed, in no acute distress  Chest: Lungs clear to auscultation bilaterally  Neurological examination  Mentation: Alert oriented to time, place, history taking. Follows all commands speech and language fluent Cranial nerve II-XII: Extraocular movements were full, visual field were full on confrontational test Head turning and shoulder shrug  were normal and symmetric. Motor: The motor testing reveals 5 over 5 strength of all 4 extremities. Good symmetric motor tone is noted throughout.  Sensory: Sensory testing is intact to soft touch on all 4 extremities. No evidence of extinction is noted.  Gait and station: Gait is normal.    DIAGNOSTIC DATA (LABS, IMAGING, TESTING) - I reviewed patient records, labs, notes, testing and imaging myself where available.  Lab Results  Component Value Date   WBC 6.0 05/15/2019   HGB 17.3 05/15/2019   HCT 51.1 (H) 05/15/2019   MCV 81 05/15/2019   PLT 163 05/15/2019      Component Value Date/Time   NA 141 06/04/2021 1023   K 3.2 (L) 06/04/2021 1023   CL 97 06/04/2021 1023   CO2 27 06/04/2021 1023   GLUCOSE 152 (H) 06/04/2021 1023   GLUCOSE 83 05/13/2018 0957   BUN 16  06/04/2021 1023   CREATININE  0.85 06/04/2021 1023   CREATININE 1.02 08/24/2017 1458   CALCIUM 9.4 06/04/2021 1023   PROT 6.4 06/04/2021 1023   ALBUMIN 4.2 06/04/2021 1023   AST 26 06/04/2021 1023   ALT 53 (H) 06/04/2021 1023   ALKPHOS 64 06/04/2021 1023   BILITOT 0.5 06/04/2021 1023   GFRNONAA 97 08/06/2020 0734   GFRNONAA >89 07/18/2015 1656   GFRAA 112 08/06/2020 0734   GFRAA >89 07/18/2015 1656   Lab Results  Component Value Date   CHOL 108 06/04/2021   HDL 33 (L) 06/04/2021   LDLCALC 32 06/04/2021   TRIG 285 (H) 06/04/2021   CHOLHDL 3.4 08/24/2017   Lab Results  Component Value Date   HGBA1C 7.7 (H) 06/04/2021   Lab Results  Component Value Date   VITAMINB12 502 09/26/2018   Lab Results  Component Value Date   TSH 2.200 09/26/2018      ASSESSMENT AND PLAN 51 y.o. year old male  has a past medical history of ADEM (acute disseminated encephalomyelitis), Allergy, Anxiety (2005), Complication of anesthesia, Constipation, Depression (2005), Diabetes mellitus without complication (HCC), Difficult intubation, Dyspnea, Headache(784.0), Hearing loss, Hyperinsulinemia (07/04/11), Hyperlipidemia, Hypertension, Mononucleosis, infectious, with hepatitis (06/2011), MS (multiple sclerosis) (HCC), Nausea, Neuromuscular disorder (HCC), Pneumonia (2010), Sleep apnea, Stroke (HCC) (2000), TIA (transient ischemic attack) (2000), and TIA (transient ischemic attack). here with:  OSA on CPAP  - CPAP compliance excellent - Good treatment of AHI  - Encourage patient to use CPAP nightly and > 4 hours each night - F/U in 1 year or sooner if needed   Butch Penny, MSN, NP-C 08/06/2021, 8:42 AM [facility] [address], [address] [place], [place] [address] [phone]

## 2021-08-06 NOTE — Progress Notes (Signed)
Patient's set up date for his current machine was 02/2018.  He does not qualify for a new machine at this time.  HST will be canceled for now

## 2021-08-12 ENCOUNTER — Ambulatory Visit (INDEPENDENT_AMBULATORY_CARE_PROVIDER_SITE_OTHER): Payer: 59 | Admitting: Family Medicine

## 2021-08-12 ENCOUNTER — Encounter (INDEPENDENT_AMBULATORY_CARE_PROVIDER_SITE_OTHER): Payer: Self-pay | Admitting: Family Medicine

## 2021-08-12 ENCOUNTER — Other Ambulatory Visit: Payer: Self-pay

## 2021-08-12 VITALS — BP 119/76 | HR 85 | Temp 98.3°F | Ht 68.0 in | Wt 292.0 lb

## 2021-08-12 DIAGNOSIS — Z7985 Long-term (current) use of injectable non-insulin antidiabetic drugs: Secondary | ICD-10-CM | POA: Diagnosis not present

## 2021-08-12 DIAGNOSIS — E669 Obesity, unspecified: Secondary | ICD-10-CM

## 2021-08-12 DIAGNOSIS — Z6841 Body Mass Index (BMI) 40.0 and over, adult: Secondary | ICD-10-CM

## 2021-08-12 DIAGNOSIS — I10 Essential (primary) hypertension: Secondary | ICD-10-CM | POA: Diagnosis not present

## 2021-08-12 DIAGNOSIS — E119 Type 2 diabetes mellitus without complications: Secondary | ICD-10-CM

## 2021-08-12 DIAGNOSIS — E7849 Other hyperlipidemia: Secondary | ICD-10-CM

## 2021-08-12 DIAGNOSIS — E559 Vitamin D deficiency, unspecified: Secondary | ICD-10-CM

## 2021-08-12 MED ORDER — VITAMIN D (ERGOCALCIFEROL) 1.25 MG (50000 UNIT) PO CAPS: ORAL_CAPSULE | ORAL | 0 refills | Status: DC

## 2021-08-12 MED ORDER — TIRZEPATIDE 2.5 MG/0.5ML ~~LOC~~ SOAJ: 2.5000 mg | SUBCUTANEOUS | 0 refills | Status: DC

## 2021-08-12 NOTE — Progress Notes (Signed)
Chief Complaint:   OBESITY Brandon [name] is here to discuss his progress with his obesity treatment plan along with follow-up of his obesity related diagnoses. Brandon [name] is on the Category 4 Plan and states he is following his eating plan approximately 30% of the time. Brandon [name] states he is doing some walking 4 times per week.   Today's visit was #: 31 Starting weight: 305 lbs Starting date: 09/15/2017 Today's weight: 292 lbs Today's date: 08/12/2021 Total lbs lost to date: 13 Total lbs lost since last in-office visit: 0  Interim History: Brandon [name] did some celebration eating. He is working on decreasing juice and other simple sugars. He has increased his activity and walking, and he feels he will be able to get back on track.  Subjective:   1. Type 2 diabetes mellitus without complication, without long-term current use of insulin ([place]) Brandon [name] is on Victoza, but he forgets to take a daily injection.  2. Other hyperlipidemia Brandon [name] is on statin, and he is working on diet and he is due for labs.  3. Vitamin D deficiency Brandon [name] is on Vit D, and he is due for labs.  4. Benign essential HTN Brandon [name]'s blood pressure has improved on his medications. He is working on diet and weight loss.  Assessment/Plan:   1. Type 2 diabetes mellitus without complication, without long-term current use of insulin (HCC) We will check labs today. Brandon [name] agreed to discontinue Victoza, and start Mounjaro 2.5 mg weekly with no refills. Good blood sugar control is important to decrease the likelihood of diabetic complications such as nephropathy, neuropathy, limb loss, blindness, coronary artery disease, and death. Intensive lifestyle modification including diet, exercise and weight loss are the first line of treatment for diabetes.   - CMP14+EGFR - Insulin, random - Hemoglobin A1c - Microalbumin / creatinine urine ratio - tirzepatide (MOUNJARO) 2.5 MG/0.5ML Pen; Inject 2.5 mg into the skin once a week.   Dispense: 2 mL; Refill: 0  2. Other hyperlipidemia Cardiovascular risk and specific lipid/LDL goals reviewed.  We discussed several lifestyle modifications today. We will check labs today. Brandon [name] will continue to work on diet, exercise and weight loss efforts. Orders and follow up as documented in patient record.   - Lipid Panel With LDL/HDL Ratio  3. Vitamin D deficiency Low Vitamin D level contributes to fatigue and are associated with obesity, breast, and colon cancer. We will check labs today, and we will refill prescription Vitamin D for 1 month. Brandon [name] will follow-up for routine testing of Vitamin D, at least 2-3 times per year to avoid over-replacement.  - Vitamin D, Ergocalciferol, (DRISDOL) 1.25 MG (50000 UNIT) CAPS capsule; TAKE 1 CAPSULE BY MOUTH EVERY 3 DAYS  Dispense: 10 capsule; Refill: 0 - VITAMIN D 25 Hydroxy (Vit-D Deficiency, Fractures)  4. Benign essential HTN We will check labs today. Brandon [name] will continue with diet and exercise to improve blood pressure control. We will watch for signs of hypotension as he continues his lifestyle modifications.  5. Obesity with current BMI of 44.5 Brandon [name] is currently in the action stage of change. As such, his goal is to continue with weight loss efforts. He has agreed to the Category 4 Plan.   Exercise goals: As is.  Behavioral modification strategies: increasing lean protein intake, increasing water intake, and meal planning and cooking strategies.  Brandon [name] has agreed to follow-up with our clinic in 4 weeks. He was informed of the importance of frequent follow-up visits to maximize his success with intensive lifestyle modifications for  his multiple health conditions.   Brandon [name] was informed we would discuss his lab results at his next visit unless there is a critical issue that needs to be addressed sooner. Brandon [name] agreed to keep his next visit at the agreed upon time to discuss these results.  Objective:   Blood pressure 119/76,  pulse 85, temperature 98.3 F (36.8 C), height 5' 8"  (1.727 m), weight 292 lb (132.5 kg), SpO2 99 %. Body mass index is 44.4 kg/m.  General: Cooperative, alert, well developed, in no acute distress. HEENT: Conjunctivae and lids unremarkable. Cardiovascular: Regular rhythm.  Lungs: Normal work of breathing. Neurologic: No focal deficits.   Lab Results  Component Value Date   CREATININE 0.85 06/04/2021   BUN 16 06/04/2021   NA 141 06/04/2021   K 3.2 (L) 06/04/2021   CL 97 06/04/2021   CO2 27 06/04/2021   Lab Results  Component Value Date   ALT 53 (H) 06/04/2021   AST 26 06/04/2021   ALKPHOS 64 06/04/2021   BILITOT 0.5 06/04/2021   Lab Results  Component Value Date   HGBA1C 7.7 (H) 06/04/2021   HGBA1C 7.6 (H) 12/25/2020   HGBA1C 6.7 (H) 08/06/2020   HGBA1C 5.8 (H) 01/03/2020   HGBA1C 6.1 (H) 09/13/2019   Lab Results  Component Value Date   INSULIN 126.0 (H) 06/04/2021   INSULIN 185.0 (H) 08/06/2020   INSULIN 83.4 (H) 01/03/2020   INSULIN 48.4 (H) 09/13/2019   INSULIN 94.0 (H) 05/15/2019   Lab Results  Component Value Date   TSH 2.200 09/26/2018   Lab Results  Component Value Date   CHOL 108 06/04/2021   HDL 33 (L) 06/04/2021   LDLCALC 32 06/04/2021   TRIG 285 (H) 06/04/2021   CHOLHDL 3.4 08/24/2017   Lab Results  Component Value Date   VD25OH 42.6 06/04/2021   VD25OH 36.6 12/25/2020   VD25OH 38.1 08/06/2020   Lab Results  Component Value Date   WBC 6.0 05/15/2019   HGB 17.3 05/15/2019   HCT 51.1 (H) 05/15/2019   MCV 81 05/15/2019   PLT 163 05/15/2019   No results found for: IRON, TIBC, FERRITIN  Attestation Statements:   Reviewed by clinician on day of visit: allergies, medications, problem list, medical history, surgical history, family history, social history, and previous encounter notes.   I, Trixie Dredge, am acting as transcriptionist for Dennard Nip, MD.  I have reviewed the above documentation for accuracy and completeness, and I  agree with the above. -  Dennard Nip, MD

## 2021-08-13 LAB — LIPID PANEL WITH LDL/HDL RATIO
Cholesterol, Total: 137 mg/dL (ref 100–199)
HDL: 33 mg/dL — ABNORMAL LOW (ref >39)
LDL Chol Calc (NIH): 33 mg/dL (ref 0–99)
LDL/HDL Ratio: 1 ratio (ref 0.0–3.6)
Triglycerides: 504 mg/dL — ABNORMAL HIGH (ref 0–149)
VLDL Cholesterol Cal: 71 mg/dL — ABNORMAL HIGH (ref 5–40)

## 2021-08-13 LAB — CMP14+EGFR
ALT: 81 IU/L — ABNORMAL HIGH (ref 0–44)
AST: 36 IU/L (ref 0–40)
Albumin/Globulin Ratio: 1.8 (ref 1.2–2.2)
Albumin: 4.5 g/dL (ref 4.0–5.0)
Alkaline Phosphatase: 57 IU/L (ref 44–121)
BUN/Creatinine Ratio: 19 (ref 9–20)
BUN: 15 mg/dL (ref 6–24)
Bilirubin Total: 0.5 mg/dL (ref 0.0–1.2)
CO2: 26 mmol/L (ref 20–29)
Calcium: 9.8 mg/dL (ref 8.7–10.2)
Chloride: 97 mmol/L (ref 96–106)
Creatinine, Ser: 0.79 mg/dL (ref 0.76–1.27)
Globulin, Total: 2.5 g/dL (ref 1.5–4.5)
Glucose: 141 mg/dL — ABNORMAL HIGH (ref 70–99)
Potassium: 3.3 mmol/L — ABNORMAL LOW (ref 3.5–5.2)
Sodium: 142 mmol/L (ref 134–144)
Total Protein: 7 g/dL (ref 6.0–8.5)
eGFR: 109 mL/min/{1.73_m2} (ref >59)

## 2021-08-13 LAB — VITAMIN D 25 HYDROXY (VIT D DEFICIENCY, FRACTURES): Vit D, 25-Hydroxy: 42.8 ng/mL (ref 30.0–100.0)

## 2021-08-13 LAB — HEMOGLOBIN A1C
Est. average glucose Bld gHb Est-mCnc: 146 mg/dL
Hgb A1c MFr Bld: 6.7 % — ABNORMAL HIGH (ref 4.8–5.6)

## 2021-08-13 LAB — MICROALBUMIN / CREATININE URINE RATIO
Creatinine, Urine: 159.4 mg/dL
Microalb/Creat Ratio: 25 mg/g creat (ref 0–29)
Microalbumin, Urine: 39.7 ug/mL

## 2021-08-13 LAB — INSULIN, RANDOM: INSULIN: 266 u[IU]/mL — ABNORMAL HIGH (ref 2.6–24.9)

## 2021-08-17 ENCOUNTER — Encounter (INDEPENDENT_AMBULATORY_CARE_PROVIDER_SITE_OTHER): Payer: Self-pay | Admitting: Family Medicine

## 2021-08-18 DIAGNOSIS — R202 Paresthesia of skin: Secondary | ICD-10-CM | POA: Diagnosis not present

## 2021-08-18 DIAGNOSIS — M62838 Other muscle spasm: Secondary | ICD-10-CM | POA: Diagnosis not present

## 2021-08-18 DIAGNOSIS — E119 Type 2 diabetes mellitus without complications: Secondary | ICD-10-CM | POA: Diagnosis not present

## 2021-08-18 DIAGNOSIS — G8929 Other chronic pain: Secondary | ICD-10-CM | POA: Diagnosis not present

## 2021-08-18 DIAGNOSIS — Z791 Long term (current) use of non-steroidal anti-inflammatories (NSAID): Secondary | ICD-10-CM | POA: Diagnosis not present

## 2021-08-18 DIAGNOSIS — E876 Hypokalemia: Secondary | ICD-10-CM | POA: Diagnosis not present

## 2021-08-18 DIAGNOSIS — M542 Cervicalgia: Secondary | ICD-10-CM | POA: Diagnosis not present

## 2021-08-18 DIAGNOSIS — E785 Hyperlipidemia, unspecified: Secondary | ICD-10-CM | POA: Diagnosis not present

## 2021-08-18 DIAGNOSIS — I152 Hypertension secondary to endocrine disorders: Secondary | ICD-10-CM | POA: Diagnosis not present

## 2021-08-18 DIAGNOSIS — E1169 Type 2 diabetes mellitus with other specified complication: Secondary | ICD-10-CM | POA: Diagnosis not present

## 2021-08-18 DIAGNOSIS — E1159 Type 2 diabetes mellitus with other circulatory complications: Secondary | ICD-10-CM | POA: Diagnosis not present

## 2021-08-18 DIAGNOSIS — M5030 Other cervical disc degeneration, unspecified cervical region: Secondary | ICD-10-CM | POA: Diagnosis not present

## 2021-08-18 NOTE — Telephone Encounter (Signed)
Prior authorization had already been started for [facility]. We are just waiting for insurance to respond.

## 2021-08-18 NOTE — Telephone Encounter (Signed)
Please see message and advise.  Thank you. ° °

## 2021-08-18 NOTE — Telephone Encounter (Signed)
Please see message . Thank you .

## 2021-08-19 ENCOUNTER — Telehealth (INDEPENDENT_AMBULATORY_CARE_PROVIDER_SITE_OTHER): Payer: Self-pay | Admitting: Family Medicine

## 2021-08-19 ENCOUNTER — Encounter (INDEPENDENT_AMBULATORY_CARE_PROVIDER_SITE_OTHER): Payer: Self-pay

## 2021-08-19 NOTE — Telephone Encounter (Signed)
Prior authorization denied for [facility]. Patient sent message via mychart with coupon information.

## 2021-08-22 ENCOUNTER — Emergency Department (HOSPITAL_BASED_OUTPATIENT_CLINIC_OR_DEPARTMENT_OTHER)
Admission: EM | Admit: 2021-08-22 | Discharge: 2021-08-22 | Disposition: A | Discharge status: Home / Self Care | Payer: 59 | Attending: Emergency Medicine | Admitting: Emergency Medicine

## 2021-08-22 ENCOUNTER — Emergency Department (HOSPITAL_BASED_OUTPATIENT_CLINIC_OR_DEPARTMENT_OTHER): Payer: 59

## 2021-08-22 ENCOUNTER — Other Ambulatory Visit: Payer: Self-pay

## 2021-08-22 ENCOUNTER — Encounter (HOSPITAL_BASED_OUTPATIENT_CLINIC_OR_DEPARTMENT_OTHER): Payer: Self-pay | Admitting: *Deleted

## 2021-08-22 DIAGNOSIS — S43032A Inferior subluxation of left humerus, initial encounter: Secondary | ICD-10-CM | POA: Insufficient documentation

## 2021-08-22 DIAGNOSIS — Z794 Long term (current) use of insulin: Secondary | ICD-10-CM | POA: Insufficient documentation

## 2021-08-22 DIAGNOSIS — S4992XA Unspecified injury of left shoulder and upper arm, initial encounter: Secondary | ICD-10-CM | POA: Diagnosis present

## 2021-08-22 DIAGNOSIS — X58XXXA Exposure to other specified factors, initial encounter: Secondary | ICD-10-CM | POA: Insufficient documentation

## 2021-08-22 DIAGNOSIS — Z7984 Long term (current) use of oral hypoglycemic drugs: Secondary | ICD-10-CM | POA: Insufficient documentation

## 2021-08-22 DIAGNOSIS — E119 Type 2 diabetes mellitus without complications: Secondary | ICD-10-CM | POA: Insufficient documentation

## 2021-08-22 DIAGNOSIS — M25512 Pain in left shoulder: Secondary | ICD-10-CM | POA: Diagnosis not present

## 2021-08-22 DIAGNOSIS — I10 Essential (primary) hypertension: Secondary | ICD-10-CM | POA: Insufficient documentation

## 2021-08-22 DIAGNOSIS — S43035A Inferior dislocation of left humerus, initial encounter: Secondary | ICD-10-CM | POA: Diagnosis not present

## 2021-08-22 MED ORDER — HYDROMORPHONE HCL 1 MG/ML IJ SOLN
1.0000 mg | Freq: Once | INTRAMUSCULAR | Status: AC
  Administered 2021-08-22: 1 mg via INTRAVENOUS
  Filled 2021-08-22: qty 1

## 2021-08-22 MED ORDER — HYDROMORPHONE HCL 1 MG/ML IJ SOLN: 1.0000 mg | Freq: Once | INTRAMUSCULAR | Status: DC

## 2021-08-22 MED ORDER — HYDROMORPHONE HCL 1 MG/ML IJ SOLN
1.0000 mg | Freq: Once | INTRAMUSCULAR | Status: AC
Start: 2021-08-22 — End: 2021-08-22
  Administered 2021-08-22: 1 mg via INTRAMUSCULAR
  Filled 2021-08-22: qty 1

## 2021-08-22 MED ORDER — HYDROMORPHONE HCL 1 MG/ML IJ SOLN
1.0000 mg | Freq: Once | INTRAMUSCULAR | Status: DC
  Filled 2021-08-22: qty 1

## 2021-08-22 MED ORDER — PROPOFOL 10 MG/ML IV BOLUS: 1.0000 mg/kg | Freq: Once | INTRAVENOUS | Status: DC

## 2021-08-22 MED ORDER — OXYCODONE-ACETAMINOPHEN 5-325 MG PO TABS: 1.0000 | ORAL_TABLET | Freq: Four times a day (QID) | ORAL | 0 refills | Status: DC | PRN

## 2021-08-22 NOTE — Discharge Instructions (Addendum)
Take Motrin for pain.  Take Percocet for severe pain.  Please wear your shoulder immobilizer for comfort  Please call orthopedic doctor on Monday for follow-up  Return to ER if you have worse shoulder pain, fingers turning numb or blue

## 2021-08-22 NOTE — ED Triage Notes (Signed)
Pt states that he injured his shoulder last Friday and has severe pain in left shouler, today he had an xray at [facility] which showed that his shoulder was dislocated.  Pt was sent here for further treatment.

## 2021-08-22 NOTE — ED Provider Notes (Signed)
[facility] EMERGENCY DEPT Provider Note   CSN: [id number] Arrival date & time: 08/22/21  1058     History  Chief Complaint  Patient presents with   Shoulder Injury    dislocated    Brandon [name] is a 51 y.o. male.  This is a 51 y.o. male with significant medical history as below, including hypertension, hyperlipidemia, OSA, obesity who presents to the ED with complaint of left shoulder injury.  Patient reports 1 week ago he experienced discomfort to his left shoulder.  No fall.  No associated trauma.  He is right-hand dominant.  He has been having ongoing discomfort since Friday.  Mildly worsening.  No fevers, chills, nausea, vomiting, no numbness or tingling; there significant pain with movement of the left upper extremity.  No neck pain or back pain.  No headaches.  No IV drug use.  No history of prior orthopedic intervention to this extremity.  Was seen at PCP earlier today and sent to the ER for evaluation   Past Medical History: No date: ADEM (acute disseminated encephalomyelitis) No date: Allergy 2005: Anxiety No date: Complication of anesthesia No date: Constipation 2005: Depression No date: Diabetes mellitus without complication (HCC)     Comment:  Type 2 No date: Difficult intubation     Comment:  difficult intubating and exbutating "throat Clinched               around tube" during an Endoscopy procedure No date: Dyspnea     Comment:  with exertion No date: Headache(784.0) No date: Hearing loss 07/04/11: Hyperinsulinemia     Comment:  "I'm on metformin cause I produce too much insulin" No date: Hyperlipidemia No date: Hypertension 06/2011: Mononucleosis, infectious, with hepatitis No date: MS (multiple sclerosis) ([place])     Comment:  patinet has ADEM not MS per Patient No date: Nausea     Comment:  occasional per history form 08/18/11 No date: Neuromuscular disorder ([place]) 2010: Pneumonia No date: Sleep apnea     Comment:  uses CPAP at  home 2000: Stroke [facility])     Comment:  TIA 2000: TIA (transient ischemic attack)     Comment:  denies residual No date: TIA (transient ischemic attack)  Past Surgical History: 10/21/2016: ANTERIOR CERVICAL DECOMP/DISCECTOMY FUSION; N/A     Comment:  Procedure: ANTERIOR CERVICAL DECOMPRESSION/DISCECTOMY               FUSION, INTERBODY PROSTHESIS,PLATE CERVICAL FOUR-               CERVICAL FIVE, CERVICAL FIVE- CERVICAL SIX;  Surgeon:               Newman Pies, MD;  Location: [place];  Service:               Neurosurgery;  Laterality: N/A; 05/19/2018: ANTERIOR CERVICAL DECOMP/DISCECTOMY FUSION; N/A     Comment:  Procedure: ANTERIOR CERVICAL DECOMPRESSION/DISCECTOMY               FUSION, INTERBODY PROSTHESIS,PLATE/SCREWS CERVICAL SIX-               CERVICAL SEVEN; EXPLORE FUSION;  Surgeon: Newman Pies, MD;  Location: [place];  Service: Neurosurgery;                Laterality: N/A; 08/02/2011: CHOLECYSTECTOMY     Comment:  Procedure: LAPAROSCOPIC CHOLECYSTECTOMY;  Surgeon:  Harl Bowie, MD;  Location: [place];  Service:               General;; 2003: COLONOSCOPY 2003: ESOPHAGOGASTRODUODENOSCOPY 07/28/2011: ESOPHAGOGASTRODUODENOSCOPY     Comment:  Procedure: ESOPHAGOGASTRODUODENOSCOPY (EGD);  Surgeon:               Scarlette Shorts, MD;  Location: [facility] ENDOSCOPY;  Service:               Endoscopy;  Laterality: N/A; 1979; 1980: MYRINGOTOMY     Comment:  bilaterally ~ 1980: [place]    The history is provided by the patient. No language interpreter was used.  Shoulder Injury Pertinent negatives include no chest pain, no abdominal pain, no headaches and no shortness of breath.      Home Medications Prior to Admission medications   Medication Sig Start Date End Date Taking? Authorizing Provider  atorvastatin (LIPITOR) 20 MG tablet TAKE 1 TABLET (20 MG TOTAL) BY MOUTH AT BEDTIME. Patient taking differently: Take 20 mg by mouth at  bedtime. 03/10/18   Weber, Damaris Hippo, PA-C  chlorthalidone (HYGROTON) 25 MG tablet Take 25 mg by mouth daily. 07/28/21   [provider]  colesevelam (WELCHOL) 625 MG tablet Take 3 tablets (1,875 mg total) by mouth daily with breakfast. 02/28/18   Gale Journey, Damaris Hippo, PA-C  cyclobenzaprine (AMRIX) 15 MG 24 hr capsule Take 15 mg by mouth 2 (two) times daily.    [provider]  gabapentin (NEURONTIN) 300 MG capsule Take 3 capsules (900 mg total) by mouth 2 (two) times daily. 08/24/17   Weber, Damaris Hippo, PA-C  Insulin Pen Needle (BD PEN NEEDLE NANO U/F) 32G X 4 MM MISC 1 Package by Does not apply route 2 (two) times daily. 03/31/21   Dennard Nip D, MD  lidocaine (LIDODERM) 5 % Place 1 patch onto the skin daily. Remove & Discard patch within 12 hours or as directed by MD 05/18/21   Randal Buba, April, MD  metFORMIN (GLUCOPHAGE-XR) 500 MG 24 hr tablet TAKE 2 TABLETS (1,000 MG TOTAL) BY MOUTH 2 (TWO) TIMES DAILY. Patient taking differently: Take 1,000 mg by mouth 2 (two) times daily. TAKE 2 TABLETS (1,000 MG TOTAL) BY MOUTH 2 (TWO) TIMES DAILY. 03/25/18   Horald Pollen, MD  NON FORMULARY CPAP machine at bedtime & sleep    [provider]  tirzepatide [facility]) 2.5 MG/0.5ML Pen Inject 2.5 mg into the skin once a week. 08/12/21   Dennard Nip D, MD  Vitamin D, Ergocalciferol, (DRISDOL) 1.25 MG (50000 UNIT) CAPS capsule TAKE 1 CAPSULE BY MOUTH EVERY 3 DAYS 08/12/21   Dennard Nip D, MD      Allergies    Amoxicillin, Penicillin g, Sulfa antibiotics, Cephalosporins, Quinolones, and Sulfamethoxazole    Review of Systems   Review of Systems  Constitutional:  Negative for chills and fever.  HENT:  Negative for facial swelling and trouble swallowing.   Eyes:  Negative for photophobia and visual disturbance.  Respiratory:  Negative for cough and shortness of breath.   Cardiovascular:  Negative for chest pain and palpitations.  Gastrointestinal:  Negative for abdominal pain, nausea and  vomiting.  Endocrine: Negative for polydipsia and polyuria.  Genitourinary:  Negative for difficulty urinating and hematuria.  Musculoskeletal:  Positive for arthralgias. Negative for gait problem and joint swelling.  Skin:  Negative for pallor and rash.  Neurological:  Negative for syncope and headaches.  Psychiatric/Behavioral:  Negative for agitation and confusion.    Physical Exam  Updated Vital Signs BP 128/82    Pulse 96    Temp 97.6 F (36.4 C)    Resp 16    SpO2 100%  Physical Exam Vitals and nursing note reviewed.  Constitutional:      General: He is not in acute distress.    Appearance: He is well-developed.  HENT:     Head: Normocephalic and atraumatic.     Right Ear: External ear normal.     Left Ear: External ear normal.     Mouth/Throat:     Mouth: Mucous membranes are moist.  Eyes:     General: No scleral icterus. Cardiovascular:     Rate and Rhythm: Normal rate and regular rhythm.     Pulses: Normal pulses.     Heart sounds: Normal heart sounds.  Pulmonary:     Effort: Pulmonary effort is normal. No respiratory distress.     Breath sounds: Normal breath sounds.  Abdominal:     General: Abdomen is flat.     Palpations: Abdomen is soft.     Tenderness: There is no abdominal tenderness.  Musculoskeletal:        General: Normal range of motion.       Arms:     Cervical back: Normal range of motion.     Right lower leg: No edema.     Left lower leg: No edema.     Comments: Pain primarily to anterior portion of glenoid.  2+ radial pulse.  Neuro vasc intact in radial, ulnar median nerve distributions .  No axillary nerve impairment on superficial touch to deltoid.  Diminished range of motion left upper extremity secondary to discomfort.  Significantly reduced external rotation and flexion of left upper extremity.    No external evidence of trauma. No crepitus or bruising.  No neck pain or back pain.  No chest pain.  Skin:    General: Skin is warm and dry.      Capillary Refill: Capillary refill takes less than 2 seconds.  Neurological:     Mental Status: He is alert and oriented to person, place, and time.     GCS: GCS eye subscore is 4. GCS verbal subscore is 5. GCS motor subscore is 6.  Psychiatric:        Mood and Affect: Mood normal.        Behavior: Behavior normal.    ED Results / Procedures / Treatments   Labs (all labs ordered are listed, but only abnormal results are displayed) Labs Reviewed - No data to display  EKG None  Radiology DG Shoulder Left Portable  Result Date: 08/22/2021 CLINICAL DATA:  Injury to LEFT shoulder last Friday, severe pain, reportedly had outside imaging today showing a shoulder dislocation EXAM: LEFT SHOULDER COMPARISON:  03/18/2018; no additional exam from today available FINDINGS: Osseous mineralization normal. AC joint alignment normal. Slight widening of the glenohumeral joint with mild inferior subluxation of the humeral head. No gross dislocation identified on scapular Y-view. Visualized ribs intact. IMPRESSION: Mild widening of the LEFT glenohumeral joint with inferior subluxation of the humeral head. No acute fracture or dislocation identified. Electronically Signed   By: Lavonia Dana M.D.   On: 08/22/2021 12:33    Procedures Procedures    Medications Ordered in ED Medications  HYDROmorphone (DILAUDID) injection 1 mg (1 mg Intramuscular Not Given 08/22/21 1218)  HYDROmorphone (DILAUDID) injection 1 mg (1 mg Intramuscular Given 08/22/21 1230)    ED Course/ Medical Decision Making/ A&P Clinical Course as of  08/22/21 1416  Fri Aug 22, 2021  1356 D/w Dr Sharol Given orthopedics who recommends pt be sent to [facility] for ortho eval. Pt agreeable with this plan. He is HDS. D/w EDP Dr Maryan Rued  [SG]    Clinical Course User Index [SG] Jeanell Sparrow, DO                           Medical Decision Making Amount and/or Complexity of Data Reviewed External Data Reviewed: radiology. Radiology: ordered.  Decision-making details documented in ED Course.  Risk Prescription drug management.    CC: Left arm pain  This patient presents to the Emergency Department for the above complaint. This involves an extensive number of treatment options and is a complaint that carries with it a high risk of complications and morbidity. Vital signs were reviewed. Serious etiologies considered.  Patient received x-ray at outside facility, images do not transfer over to our EMR.  Unable to upload with disc.  We will repeat x-rays.  Record review:  Previous records obtained and reviewed   Medical and surgical history as noted above.   Work up as above, notable for:  imaging results that were available during my care of the patient were reviewed by me and considered in my medical decision making.   I ordered imaging studies which included shoulder left x-ray and I reviewed imaging which showed inferior subluxation of the humeral head on the left  Cardiac monitoring reviewed and interpreted personally which shows NSR  Social determinants of health include - N/a  Personally discussed patient care with consultant; Dr Sharol Given ortho.  Management: analgesics  Reassessment:  Discussed with orthopedics, recommend transfer to [facility] ED for orthopedic evaluation as recommended by Dr Eather Colas. Request EDP notify ortho on call when pt arrives (It will not be Dr Sharol Given per Dr Sharol Given).   D/w Dr Maryan Rued who accepts pt ED to ED.  He will be traveling by POV.  Hemodynamically stable.  Agreeable to plan for transfer to [facility].   Advised pt to stay NPO until he is seen by ortho at [facility].     This chart was dictated using voice recognition software.  Despite best efforts to proofread,  errors can occur which can change the documentation meaning.           Final Clinical Impression(s) / ED Diagnoses Final diagnoses:  Inferior subluxation of left humerus, initial encounter    Rx / DC Orders ED Discharge  Orders     None         Jeanell Sparrow, DO 08/22/21 1416

## 2021-08-22 NOTE — ED Notes (Signed)
Pt verbalized understanding of d/c instructions, meds, and followup care. Denies questions. VSS, no distress noted. Steady gait to exit with all belongings.  ?

## 2021-08-22 NOTE — ED Notes (Signed)
Report given to [facility] 

## 2021-08-22 NOTE — ED Provider Notes (Signed)
°  Physical Exam  BP (!) 121/57    Pulse (!) 108    Temp 97.6 F (36.4 C)    Resp (!) 25    SpO2 98%   Physical Exam  Procedures  Procedures  ED Course / MDM   Clinical Course as of 08/22/21 1645  Fri Aug 22, 2021  1356 D/w Dr Lajoyce Corners orthopedics who recommends pt be sent to [facility] for ortho eval. Pt agreeable with this plan. He is HDS. D/w EDP Dr Anitra Lauth  [SG]    Clinical Course User Index [SG] Sloan Leiter, DO   Medical Decision Making Patient is transferred from [facility].  The ED provider discussed with Dr. Lajoyce Corners who recommend Ortho eval.  I discussed with Dr.  Eulah Pont and he reviewed the images.  He states that patient has a inferior subluxation and does not need any reduction.  Patient is in a shoulder immobilizer already.  I also communicated with Dr. Lajoyce Corners and he states that patient can follow-up with the on-call person instead of himself since he is not on-call for [facility] or call. Will dc home with pain meds.   Problems Addressed: Inferior subluxation of left humerus, initial encounter: acute illness or injury  Amount and/or Complexity of Data Reviewed Radiology: ordered and independent interpretation performed. Decision-making details documented in ED Course.  Risk Prescription drug management.          Charlynne Pander, MD 08/22/21 [phone]

## 2021-08-22 NOTE — ED Notes (Signed)
Attempted report to [facility] no answer

## 2021-08-27 ENCOUNTER — Other Ambulatory Visit: Payer: Self-pay | Admitting: Orthopedic Surgery

## 2021-08-27 ENCOUNTER — Emergency Department (HOSPITAL_BASED_OUTPATIENT_CLINIC_OR_DEPARTMENT_OTHER): Payer: 59 | Admitting: Radiology

## 2021-08-27 ENCOUNTER — Other Ambulatory Visit: Payer: Self-pay

## 2021-08-27 ENCOUNTER — Emergency Department (HOSPITAL_BASED_OUTPATIENT_CLINIC_OR_DEPARTMENT_OTHER)
Admission: EM | Admit: 2021-08-27 | Discharge: 2021-08-27 | Disposition: A | Discharge status: Home / Self Care | Payer: 59 | Attending: Emergency Medicine | Admitting: Emergency Medicine

## 2021-08-27 ENCOUNTER — Encounter (HOSPITAL_BASED_OUTPATIENT_CLINIC_OR_DEPARTMENT_OTHER): Payer: Self-pay

## 2021-08-27 DIAGNOSIS — Z87891 Personal history of nicotine dependence: Secondary | ICD-10-CM | POA: Diagnosis not present

## 2021-08-27 DIAGNOSIS — Z794 Long term (current) use of insulin: Secondary | ICD-10-CM | POA: Insufficient documentation

## 2021-08-27 DIAGNOSIS — E119 Type 2 diabetes mellitus without complications: Secondary | ICD-10-CM | POA: Diagnosis not present

## 2021-08-27 DIAGNOSIS — Z7984 Long term (current) use of oral hypoglycemic drugs: Secondary | ICD-10-CM | POA: Insufficient documentation

## 2021-08-27 DIAGNOSIS — I10 Essential (primary) hypertension: Secondary | ICD-10-CM | POA: Insufficient documentation

## 2021-08-27 DIAGNOSIS — M67911 Unspecified disorder of synovium and tendon, right shoulder: Secondary | ICD-10-CM

## 2021-08-27 DIAGNOSIS — M25512 Pain in left shoulder: Secondary | ICD-10-CM | POA: Diagnosis not present

## 2021-08-27 DIAGNOSIS — X58XXXA Exposure to other specified factors, initial encounter: Secondary | ICD-10-CM | POA: Diagnosis not present

## 2021-08-27 DIAGNOSIS — S43002A Unspecified subluxation of left shoulder joint, initial encounter: Secondary | ICD-10-CM | POA: Insufficient documentation

## 2021-08-27 DIAGNOSIS — M542 Cervicalgia: Secondary | ICD-10-CM | POA: Diagnosis not present

## 2021-08-27 DIAGNOSIS — S4992XA Unspecified injury of left shoulder and upper arm, initial encounter: Secondary | ICD-10-CM | POA: Diagnosis present

## 2021-08-27 MED ORDER — OXYCODONE-ACETAMINOPHEN 10-325 MG PO TABS: 1.0000 | ORAL_TABLET | Freq: Four times a day (QID) | ORAL | 0 refills | Status: DC | PRN

## 2021-08-27 MED ORDER — HYDROMORPHONE HCL 1 MG/ML IJ SOLN
2.0000 mg | Freq: Once | INTRAMUSCULAR | Status: AC
  Administered 2021-08-27: 2 mg via INTRAMUSCULAR
  Filled 2021-08-27: qty 2

## 2021-08-27 MED ORDER — ONDANSETRON 4 MG PO TBDP
8.0000 mg | ORAL_TABLET | Freq: Once | ORAL | Status: AC
  Administered 2021-08-27: 8 mg via ORAL
  Filled 2021-08-27: qty 2

## 2021-08-27 NOTE — ED Notes (Signed)
Patient transported to x-ray via stretcher with tech °

## 2021-08-27 NOTE — Discharge Instructions (Signed)
Please follow-up with physical therapy this afternoon as scheduled.

## 2021-08-27 NOTE — ED Triage Notes (Signed)
Left shoulder pain since injury on 08/15/2021. Was seen on 08/22/2021 and given meds for pain.   States he remains in pain even with pain meds.

## 2021-08-27 NOTE — ED Provider Notes (Signed)
[facility] EMERGENCY Provider Note: Lowella Dell, MD, FACEP  CSN: [id number] MRN: [id number] ARRIVAL: 08/27/21 at 0000 ROOM: DB013/DB013   CHIEF COMPLAINT  Shoulder Pain   HISTORY OF PRESENT ILLNESS  08/27/21 3:17 AM Brandon [name] is a 51 y.o. male who was diagnosed with an inferior subluxation of the left shoulder on 08/22/2021 following an injury about a week earlier.  He was discharged home on oxycodone and a shoulder immobilizer.  His pain was somewhat well controlled with oxycodone but he is now out.  He has been taking hydrocodone, prescribed by his PCP earlier on 08/22/2021, without any relief.  He returns with worse pain in his left shoulder which she rates as an 8 out of 10.  It is worse with any attempt to move his left shoulder and he is essentially unable to move his left shoulder due to the pain.  The pain radiates down his left arm into his left hand.  Sensation is still intact.  He is scheduled for physical therapy this afternoon.   Past Medical History:  Diagnosis Date   ADEM (acute disseminated encephalomyelitis)    Allergy    Anxiety 2005   Complication of anesthesia    Constipation    Depression 2005   Diabetes mellitus without complication (HCC)    Type 2   Difficult intubation    difficult intubating and exbutating "throat Clinched around tube" during an Endoscopy procedure   Dyspnea    with exertion   Headache(784.0)    Hearing loss    Hyperinsulinemia 07/04/11   "I'm on metformin cause I produce too much insulin"   Hyperlipidemia    Hypertension    Mononucleosis, infectious, with hepatitis 06/2011   MS (multiple sclerosis) (HCC)    patinet has ADEM not MS per Patient   Nausea    occasional per history form 08/18/11   Neuromuscular disorder (HCC)    Pneumonia 2010   Sleep apnea    uses CPAP at home   Stroke [facility]) 2000   TIA   TIA (transient ischemic attack) 2000   denies residual   TIA (transient ischemic attack)     Past Surgical History:   Procedure Laterality Date   ANTERIOR CERVICAL DECOMP/DISCECTOMY FUSION N/A 10/21/2016   Procedure: ANTERIOR CERVICAL DECOMPRESSION/DISCECTOMY FUSION, INTERBODY PROSTHESIS,PLATE CERVICAL FOUR- CERVICAL FIVE, CERVICAL FIVE- CERVICAL SIX;  Surgeon: Tressie Stalker, MD;  Location[facility] OR;  Service: Neurosurgery;  Laterality: N/A;   ANTERIOR CERVICAL DECOMP/DISCECTOMY FUSION N/A 05/19/2018   Procedure: ANTERIOR CERVICAL DECOMPRESSION/DISCECTOMY FUSION, INTERBODY PROSTHESIS,PLATE/SCREWS CERVICAL SIX- CERVICAL SEVEN; EXPLORE FUSION;  Surgeon: Tressie Stalker, MD;  Location: [facility] OR;  Service: Neurosurgery;  Laterality: N/A;   CHOLECYSTECTOMY  08/02/2011   Procedure: LAPAROSCOPIC CHOLECYSTECTOMY;  Surgeon: Shelly Rubenstein, MD;  Location: [facility] OR;  Service: General;;   COLONOSCOPY  2003   ESOPHAGOGASTRODUODENOSCOPY  2003   ESOPHAGOGASTRODUODENOSCOPY  07/28/2011   Procedure: ESOPHAGOGASTRODUODENOSCOPY (EGD);  Surgeon: [name], MD;  Location: [facility] ENDOSCOPY;  Service: Endoscopy;  Laterality: N/A;   MYRINGOTOMY  1979; 1980   bilaterally   TONSILLECTOMY AND ADENOIDECTOMY  ~ 1980    Family History  Problem Relation Age of Onset   Diabetes Mother    Obesity Mother    Coronary artery disease Father    Stroke Father    Cancer Father    High blood pressure Father    Alcoholism Father    Cancer Maternal Grandmother        bone   Cancer Other  Stomach -Great-great grandmother   Sleep apnea Neg Hx     Social History   Tobacco Use   Smoking status: Former    Packs/day: 3.00    Years: 3.00    Pack years: 9.00    Types: Cigarettes    Quit date: 08/18/1987    Years since quitting: 34.0   Smokeless tobacco: Never  Vaping Use   Vaping Use: Never used  Substance Use Topics   Alcohol use: Not Currently    Alcohol/week: 0.0 standard drinks    Comment: 07/04/11 "glass of wine or spirits once a month"   Drug use: Not Currently    Types: Marijuana    Comment: have not used in a 1.5 years     Prior to Admission medications   Medication Sig Start Date End Date Taking? Authorizing Provider  oxyCODONE-acetaminophen (PERCOCET) 10-325 MG tablet Take 1 tablet by mouth every 6 (six) hours as needed for pain. 08/27/21  Yes Deone Leifheit, MD  atorvastatin (LIPITOR) 20 MG tablet TAKE 1 TABLET (20 MG TOTAL) BY MOUTH AT BEDTIME. Patient taking differently: Take 20 mg by mouth at bedtime. 03/10/18   Weber, Dema Severin, PA-C  chlorthalidone (HYGROTON) 25 MG tablet Take 25 mg by mouth daily. 07/28/21   [provider]  colesevelam (WELCHOL) 625 MG tablet Take 3 tablets (1,875 mg total) by mouth daily with breakfast. 02/28/18   Valarie Cones, Dema Severin, PA-C  cyclobenzaprine (AMRIX) 15 MG 24 hr capsule Take 15 mg by mouth 2 (two) times daily.    [provider]  gabapentin (NEURONTIN) 300 MG capsule Take 3 capsules (900 mg total) by mouth 2 (two) times daily. 08/24/17   Weber, Dema Severin, PA-C  Insulin Pen Needle (BD PEN NEEDLE NANO U/F) 32G X 4 MM MISC 1 Package by Does not apply route 2 (two) times daily. 03/31/21   Quillian Quince D, MD  lidocaine (LIDODERM) 5 % Place 1 patch onto the skin daily. Remove & Discard patch within 12 hours or as directed by MD 05/18/21   Nicanor Alcon, April, MD  metFORMIN (GLUCOPHAGE-XR) 500 MG 24 hr tablet TAKE 2 TABLETS (1,000 MG TOTAL) BY MOUTH 2 (TWO) TIMES DAILY. Patient taking differently: Take 1,000 mg by mouth 2 (two) times daily. TAKE 2 TABLETS (1,000 MG TOTAL) BY MOUTH 2 (TWO) TIMES DAILY. 03/25/18   Georgina Quint, MD  NON FORMULARY CPAP machine at bedtime & sleep    [provider]  tirzepatide [facility]) 2.5 MG/0.5ML Pen Inject 2.5 mg into the skin once a week. 08/12/21   Quillian Quince D, MD  Vitamin D, Ergocalciferol, (DRISDOL) 1.25 MG (50000 UNIT) CAPS capsule TAKE 1 CAPSULE BY MOUTH EVERY 3 DAYS 08/12/21   Quillian Quince D, MD    Allergies Amoxicillin, Penicillin g, Sulfa antibiotics, Cephalosporins, Quinolones, and Sulfamethoxazole   REVIEW OF  SYSTEMS  Negative except as noted here or in the History of Present Illness.   PHYSICAL EXAMINATION  Initial Vital Signs Blood pressure (!) 142/85, pulse (!) 113, temperature 98.6 F (37 C), resp. rate 18, height 5\' 8"  (1.727 m), weight 132 kg, SpO2 98 %.  Examination General: Well-developed, high BMI male in no acute distress; appearance consistent with age of record HENT: normocephalic; atraumatic Eyes: Normal appearance Neck: supple Heart: regular rate and rhythm Lungs: clear to auscultation bilaterally Abdomen: soft; nondistended; nontender; bowel sounds present Extremities: No deformity; tenderness and limited range of motion of left shoulder, left upper extremity distally neurovascularly intact Neurologic: Awake, alert and oriented; motor function  intact in all extremities and symmetric; no facial droop Skin: Warm and dry Psychiatric: Grimacing   RESULTS  Summary of this visit's results, reviewed and interpreted by myself:   EKG Interpretation  Date/Time:    Ventricular Rate:    PR Interval:    QRS Duration:   QT Interval:    QTC Calculation:   R Axis:     Text Interpretation:         Laboratory Studies: No results found for this or any previous visit (from the past 24 hour(s)). Imaging Studies: DG Shoulder Left  Result Date: 08/27/2021 CLINICAL DATA:  Left shoulder pain. EXAM: LEFT SHOULDER - 2+ VIEW COMPARISON:  August 22, 2021 FINDINGS: There is no acute fracture. Persistent widening of the left glenohumeral joint is seen with mild inferior subluxation of the left humeral head. There is no evidence of arthropathy or other focal bone abnormality. Soft tissues are unremarkable. IMPRESSION: Persistent widening of the left glenohumeral joint with mild inferior subluxation of the left humeral head. Electronically Signed   By: Aram Candela M.D.   On: 08/27/2021 03:52    ED COURSE and MDM  Nursing notes, initial and subsequent vitals signs, including pulse  oximetry, reviewed and interpreted by myself.  Vitals:   08/27/21 0025 08/27/21 0036 08/27/21 0350  BP: (!) 142/85  124/70  Pulse: (!) 113  (!) 106  Resp: 18  (!) 24  Temp: 98.6 F (37 C)    SpO2: 98%  96%  Weight:  132 kg   Height:  5\' 8"  (1.727 m)    Medications  ondansetron (ZOFRAN-ODT) disintegrating tablet 8 mg (8 mg Oral Given 08/27/21 0331)  HYDROmorphone (DILAUDID) injection 2 mg (2 mg Intramuscular Given 08/27/21 0333)   Persistent subluxation seen on radiograph without frank dislocation.  I suspect the patient's severe pain, radiating down his left arm, is due to nerve compression in the brachial plexus.  We will refill his oxycodone and encouraged him to follow-up with physical therapy this afternoon as scheduled.  Patient was told he may need an MRI to further evaluate his shoulder pain.   PROCEDURES  Procedures   ED DIAGNOSES     ICD-10-CM   1. Subluxation of left shoulder joint, initial encounter  S43.002A          Apolo Cutshaw, Jonny Ruiz, MD 08/27/21 0401

## 2021-09-01 NOTE — ED Notes (Signed)
Pt called requesting additional pain medication for his upcoming MRI.this RN advised pt to call his PCP or the referred orthopedic for additional Percocet 10/325mg  for his scheduled MRI appointment. Pt states he was prescribed percocet here in our ED during his last visit (08/27/2021) and took his last one at 0400.

## 2021-09-02 ENCOUNTER — Ambulatory Visit
Admission: RE | Admit: 2021-09-02 | Discharge: 2021-09-02 | Disposition: A | Discharge status: Home / Self Care | Payer: 59 | Source: Ambulatory Visit | Attending: Orthopedic Surgery | Admitting: Orthopedic Surgery

## 2021-09-02 ENCOUNTER — Other Ambulatory Visit: Payer: Self-pay

## 2021-09-02 DIAGNOSIS — M7552 Bursitis of left shoulder: Secondary | ICD-10-CM | POA: Diagnosis not present

## 2021-09-02 DIAGNOSIS — M25412 Effusion, left shoulder: Secondary | ICD-10-CM | POA: Diagnosis not present

## 2021-09-02 DIAGNOSIS — M19012 Primary osteoarthritis, left shoulder: Secondary | ICD-10-CM | POA: Diagnosis not present

## 2021-09-02 DIAGNOSIS — M67911 Unspecified disorder of synovium and tendon, right shoulder: Secondary | ICD-10-CM

## 2021-09-02 DIAGNOSIS — S46012A Strain of muscle(s) and tendon(s) of the rotator cuff of left shoulder, initial encounter: Secondary | ICD-10-CM | POA: Diagnosis not present

## 2021-09-10 DIAGNOSIS — M25512 Pain in left shoulder: Secondary | ICD-10-CM | POA: Diagnosis not present

## 2021-09-11 ENCOUNTER — Ambulatory Visit (INDEPENDENT_AMBULATORY_CARE_PROVIDER_SITE_OTHER): Payer: 59 | Admitting: Family Medicine

## 2021-09-11 ENCOUNTER — Other Ambulatory Visit: Payer: Self-pay

## 2021-09-11 ENCOUNTER — Encounter (INDEPENDENT_AMBULATORY_CARE_PROVIDER_SITE_OTHER): Payer: Self-pay | Admitting: Family Medicine

## 2021-09-11 VITALS — BP 103/65 | HR 89 | Temp 98.3°F | Ht 68.0 in | Wt 282.0 lb

## 2021-09-11 DIAGNOSIS — E559 Vitamin D deficiency, unspecified: Secondary | ICD-10-CM

## 2021-09-11 DIAGNOSIS — E119 Type 2 diabetes mellitus without complications: Secondary | ICD-10-CM | POA: Diagnosis not present

## 2021-09-11 DIAGNOSIS — Z6841 Body Mass Index (BMI) 40.0 and over, adult: Secondary | ICD-10-CM

## 2021-09-11 DIAGNOSIS — E669 Obesity, unspecified: Secondary | ICD-10-CM

## 2021-09-11 DIAGNOSIS — Z7984 Long term (current) use of oral hypoglycemic drugs: Secondary | ICD-10-CM | POA: Diagnosis not present

## 2021-09-11 MED ORDER — OZEMPIC (0.25 OR 0.5 MG/DOSE) 2 MG/1.5ML ~~LOC~~ SOPN: 0.5000 mg | PEN_INJECTOR | SUBCUTANEOUS | 0 refills | Status: DC

## 2021-09-11 MED ORDER — VITAMIN D (ERGOCALCIFEROL) 1.25 MG (50000 UNIT) PO CAPS: ORAL_CAPSULE | ORAL | 0 refills | Status: DC

## 2021-09-15 NOTE — Progress Notes (Signed)
? ? ? ?Chief Complaint:  ? ?OBESITY ?Brandon [name] is here to discuss his progress with his obesity treatment plan along with follow-up of his obesity related diagnoses. Brandon [name] is on the Category 4 Plan and states he is following his eating plan approximately 0% of the time. Brandon [name] states he is doing 0 minutes 0 times per week. ? ?Today's visit was #: 78 ?Starting weight: 305 lbs ?Starting date: 09/15/2017 ?Today's weight: 282 lbs ?Today's date: 09/11/2021 ?Total lbs lost to date: 64 ?Total lbs lost since last in-office visit: 10 ? ?Interim History: Brandon [name] has been struggling with additional health issues. He hasn't been able to concentrate on his diet. He has done well with weight loss, but this is due to being in bed and not eating much. ? ?Subjective:  ? ?1. Type 2 diabetes mellitus without complication, without long-term current use of insulin (Brandon [name]) ?Brandon [name] is unable to get his insurance to cover [facility]. He struggled to remember to take a daily GLP-1. ? ?2. Vitamin D deficiency ?Brandon [name] is on Vitamin D and he has a few doses left, but he will run out before his next visit. ? ?Assessment/Plan:  ? ?1. Type 2 diabetes mellitus without complication, without long-term current use of insulin ([place]) ?Brandon [name] agreed to discontinue Mounjaro, and start Ozempic at 0.25 mg q week with a 2 month supply. He will continue metformin as is. Good blood sugar control is important to decrease the likelihood of diabetic complications such as nephropathy, neuropathy, limb loss, blindness, coronary artery disease, and death. Intensive lifestyle modification including diet, exercise and weight loss are the first line of treatment for diabetes.  ? ?- Semaglutide,0.25 or 0.5MG /DOS, (OZEMPIC, 0.25 OR 0.5 MG/DOSE,) 2 MG/1.5ML SOPN; Inject 0.5 mg into the skin once a week.  Dispense: 3 mL; Refill: 0 ? ?2. Vitamin D deficiency ?We will refill prescription Vitamin D for 1 month. Brandon [name] will follow-up for routine testing of Vitamin D, at least  2-3 times per year to avoid over-replacement. ? ?- Vitamin D, Ergocalciferol, (DRISDOL) 1.25 MG (50000 UNIT) CAPS capsule; TAKE 1 CAPSULE BY MOUTH EVERY 3 DAYS  Dispense: 10 capsule; Refill: 0 ? ?3. Obesity with current BMI of 42.9 ?Brandon [name] is currently in the action stage of change. As such, his goal is to continue with weight loss efforts. He has agreed to the Category 4 Plan.  ? ?Behavioral modification strategies: increasing water intake and no skipping meals. ? ?Brandon [name] has agreed to follow-up with our clinic in 6 weeks. He was informed of the importance of frequent follow-up visits to maximize his success with intensive lifestyle modifications for his multiple health conditions.  ? ?Objective:  ? ?Blood pressure 103/65, pulse 89, temperature 98.3 ?F (36.8 ?C), height 5\' 8"  (1.727 m), weight 282 lb (127.9 kg), SpO2 99 %. ?Body mass index is 42.88 kg/m?. ? ?General: Cooperative, alert, well developed, in no acute distress. ?HEENT: Conjunctivae and lids unremarkable. ?Cardiovascular: Regular rhythm.  ?Lungs: Normal work of breathing. ?Neurologic: No focal deficits.  ? ?Lab Results  ?Component Value Date  ? CREATININE 0.79 08/12/2021  ? BUN 15 08/12/2021  ? NA 142 08/12/2021  ? K 3.3 (L) 08/12/2021  ? CL 97 08/12/2021  ? CO2 26 08/12/2021  ? ?Lab Results  ?Component Value Date  ? ALT 81 (H) 08/12/2021  ? AST 36 08/12/2021  ? ALKPHOS 57 08/12/2021  ? BILITOT 0.5 08/12/2021  ? ?Lab Results  ?Component Value Date  ? HGBA1C 6.7 (H) 08/12/2021  ? HGBA1C 7.7 (H) 06/04/2021  ?  HGBA1C 7.6 (H) 12/25/2020  ? HGBA1C 6.7 (H) 08/06/2020  ? HGBA1C 5.8 (H) 01/03/2020  ? ?Lab Results  ?Component Value Date  ? INSULIN 266.0 (H) 08/12/2021  ? INSULIN 126.0 (H) 06/04/2021  ? INSULIN 185.0 (H) 08/06/2020  ? INSULIN 83.4 (H) 01/03/2020  ? INSULIN 48.4 (H) 09/13/2019  ? ?Lab Results  ?Component Value Date  ? TSH 2.200 09/26/2018  ? ?Lab Results  ?Component Value Date  ? CHOL 137 08/12/2021  ? HDL 33 (L) 08/12/2021  ? [place] 33  08/12/2021  ? TRIG 504 (H) 08/12/2021  ? CHOLHDL 3.4 08/24/2017  ? ?Lab Results  ?Component Value Date  ? VD25OH 42.8 08/12/2021  ? VD25OH 42.6 06/04/2021  ? VD25OH 36.6 12/25/2020  ? ?Lab Results  ?Component Value Date  ? WBC 6.0 05/15/2019  ? HGB 17.3 05/15/2019  ? HCT 51.1 (H) 05/15/2019  ? MCV 81 05/15/2019  ? PLT 163 05/15/2019  ? ?No results found for: IRON, TIBC, FERRITIN ? ?Obesity Behavioral Intervention:  ? ?Approximately 15 minutes were spent on the discussion below. ? ?ASK: ?We discussed the diagnosis of obesity with Brandon [name] today and Brandon [name] agreed to give [place] permission to discuss obesity behavioral modification therapy today. ? ?ASSESS: ?Brandon [name] has the diagnosis of obesity and his BMI today is 42.9. Brandon [name] is in the action stage of change.  ? ?ADVISE: ?Brandon [name] was educated on the multiple health risks of obesity as well as the benefit of weight loss to improve his health. He was advised of the need for long term treatment and the importance of lifestyle modifications to improve his current health and to decrease his risk of future health problems. ? ?AGREE: ?Multiple dietary modification options and treatment options were discussed and Brandon [name] agreed to follow the recommendations documented in the above note. ? ?ARRANGE: ?Brandon [name] was educated on the importance of frequent visits to treat obesity as outlined per CMS and USPSTF guidelines and agreed to schedule his next follow up appointment today. ? ?Attestation Statements:  ? ?Reviewed by clinician on day of visit: allergies, medications, problem list, medical history, surgical history, family history, social history, and previous encounter notes. ? ? ?I, Trixie Dredge, am acting as transcriptionist for Dennard Nip, MD. ? ?I have reviewed the above documentation for accuracy and completeness, and I agree with the above. -  Dennard Nip, MD ? ? ?

## 2021-09-25 DIAGNOSIS — M19012 Primary osteoarthritis, left shoulder: Secondary | ICD-10-CM | POA: Diagnosis not present

## 2021-09-25 DIAGNOSIS — S46012A Strain of muscle(s) and tendon(s) of the rotator cuff of left shoulder, initial encounter: Secondary | ICD-10-CM | POA: Diagnosis not present

## 2021-09-25 DIAGNOSIS — M7522 Bicipital tendinitis, left shoulder: Secondary | ICD-10-CM | POA: Diagnosis not present

## 2021-09-25 DIAGNOSIS — Y999 Unspecified external cause status: Secondary | ICD-10-CM | POA: Diagnosis not present

## 2021-09-25 DIAGNOSIS — M7542 Impingement syndrome of left shoulder: Secondary | ICD-10-CM | POA: Diagnosis not present

## 2021-10-06 ENCOUNTER — Other Ambulatory Visit (INDEPENDENT_AMBULATORY_CARE_PROVIDER_SITE_OTHER): Payer: Self-pay | Admitting: Family Medicine

## 2021-10-06 DIAGNOSIS — E559 Vitamin D deficiency, unspecified: Secondary | ICD-10-CM

## 2021-10-23 ENCOUNTER — Ambulatory Visit (INDEPENDENT_AMBULATORY_CARE_PROVIDER_SITE_OTHER): Payer: Medicare Other | Admitting: Family Medicine

## 2021-10-27 ENCOUNTER — Ambulatory Visit (INDEPENDENT_AMBULATORY_CARE_PROVIDER_SITE_OTHER): Payer: 59 | Admitting: Family Medicine

## 2021-10-27 ENCOUNTER — Encounter (INDEPENDENT_AMBULATORY_CARE_PROVIDER_SITE_OTHER): Payer: Self-pay | Admitting: Family Medicine

## 2021-10-27 ENCOUNTER — Ambulatory Visit (INDEPENDENT_AMBULATORY_CARE_PROVIDER_SITE_OTHER): Payer: Medicare Other | Admitting: Family Medicine

## 2021-10-27 VITALS — BP 128/80 | HR 80 | Temp 97.7°F | Ht 68.0 in | Wt 272.0 lb

## 2021-10-27 DIAGNOSIS — Z7985 Long-term (current) use of injectable non-insulin antidiabetic drugs: Secondary | ICD-10-CM

## 2021-10-27 DIAGNOSIS — E119 Type 2 diabetes mellitus without complications: Secondary | ICD-10-CM

## 2021-10-27 DIAGNOSIS — E669 Obesity, unspecified: Secondary | ICD-10-CM | POA: Diagnosis not present

## 2021-10-27 DIAGNOSIS — Z6841 Body Mass Index (BMI) 40.0 and over, adult: Secondary | ICD-10-CM | POA: Diagnosis not present

## 2021-10-27 DIAGNOSIS — E559 Vitamin D deficiency, unspecified: Secondary | ICD-10-CM

## 2021-10-27 MED ORDER — OZEMPIC (0.25 OR 0.5 MG/DOSE) 2 MG/1.5ML ~~LOC~~ SOPN: 0.5000 mg | PEN_INJECTOR | SUBCUTANEOUS | 0 refills | Status: DC

## 2021-11-12 NOTE — Progress Notes (Signed)
? ? ? ?Chief Complaint:  ? ?OBESITY ?Brandon [name] is here to discuss his progress with his obesity treatment plan along with follow-up of his obesity related diagnoses. Brandon [name] is on the Category 4 Plan and states he is following his eating plan approximately 20% of the time. Brandon [name] states he is walking for 10-20 minutes 5 times per week. ? ?Today's visit was #: 56 ?Starting weight: 305 lbs ?Starting date: 09/15/2017 ?Today's weight: 272 lbs ?Today's date: 10/27/2021 ?Total lbs lost to date: 71 ?Total lbs lost since last in-office visit: 10 ? ?Interim History: Brandon [name] is not eating as many foods on the plan lately. He and his girlfriend have been eating out much more often. However, he lost 10 lbs in fat mass and maintained in muscle mass versus last office visit. ? ?Subjective:  ? ?1. Type 2 diabetes mellitus without complication, without long-term current use of insulin ([place]) ?Brandon [name] only took Ozempic one time in the past  month. He took it yesterday. He denies side effects or concerns. Not sure if it helps with his hunger or cravings yet. He has no issues. ? ?2. Vitamin D deficiency ?In Brandon [name]'s chart, it states patient has been on Ergocalciferol 1 tablet every 3 days for several months, and for several refills. However, patient states he takes it once weekly only. ? ?Assessment/Plan:  ?No orders of the defined types were placed in this encounter. ? ? ?Medications Discontinued During This Encounter  ?Medication Reason  ? lidocaine (LIDODERM) 5 % Patient Preference  ? oxyCODONE-acetaminophen (PERCOCET) 10-325 MG tablet No longer needed (for PRN medications)  ? Semaglutide,0.25 or 0.5MG /DOS, (OZEMPIC, 0.25 OR 0.5 MG/DOSE,) 2 MG/1.5ML SOPN Reorder  ?  ? ?Meds ordered this encounter  ?Medications  ? Semaglutide,0.25 or 0.5MG /DOS, (OZEMPIC, 0.25 OR 0.5 MG/DOSE,) 2 MG/1.5ML SOPN  ?  Sig: Inject 0.5 mg into the skin once a week.  ?  Dispense:  3 mL  ?  Refill:  0  ?  ? ?1. Type 2 diabetes mellitus without complication,  without long-term current use of insulin ([place]) ?We will refill Ozempic for 1 month. Brandon [name] will continue at 0.5 mg dose. Strategies for improved compliance was discussed with the patient. He will continue to follow up with his PCP for routine diabetic care.  ? ?- Semaglutide,0.25 or 0.5MG /DOS, (OZEMPIC, 0.25 OR 0.5 MG/DOSE,) 2 MG/1.5ML SOPN; Inject 0.5 mg into the skin once a week.  Dispense: 3 mL; Refill: 0 ? ?2. Vitamin D deficiency ?I recommended the patient to clarify his dose with Brandon [name] at his next office visit with her. He will continue his treatment plan as is until it is discussed.  ? ?3. Obesity with current BMI of 41.4 ?Brandon [name] is currently in the action stage of change. As such, his goal is to continue with weight loss efforts. He has agreed to the Category 4 Plan.  ? ?Exercise goals: As is. ? ?Behavioral modification strategies: decreasing eating out and meal planning and cooking strategies. ? ?Brandon [name] has agreed to follow-up with our clinic in 3 to 4 weeks with Brandon [name]. He was informed of the importance of frequent follow-up visits to maximize his success with intensive lifestyle modifications for his multiple health conditions.  ? ?Objective:  ? ?Blood pressure 128/80, pulse 80, temperature 97.7 ?F (36.5 ?C), height 5\' 8"  (1.727 m), weight 272 lb (123.4 kg), SpO2 97 %. ?Body mass index is 41.36 kg/m?. ? ?General: Cooperative, alert, well developed, in no acute distress. ?HEENT: Conjunctivae and lids unremarkable. ?Cardiovascular: Regular  rhythm.  ?Lungs: Normal work of breathing. ?Neurologic: No focal deficits.  ? ?Lab Results  ?Component Value Date  ? CREATININE 0.79 08/12/2021  ? BUN 15 08/12/2021  ? NA 142 08/12/2021  ? K 3.3 (L) 08/12/2021  ? CL 97 08/12/2021  ? CO2 26 08/12/2021  ? ?Lab Results  ?Component Value Date  ? ALT 81 (H) 08/12/2021  ? AST 36 08/12/2021  ? ALKPHOS 57 08/12/2021  ? BILITOT 0.5 08/12/2021  ? ?Lab Results  ?Component Value Date  ? HGBA1C 6.7 (H) 08/12/2021  ?  HGBA1C 7.7 (H) 06/04/2021  ? HGBA1C 7.6 (H) 12/25/2020  ? HGBA1C 6.7 (H) 08/06/2020  ? HGBA1C 5.8 (H) 01/03/2020  ? ?Lab Results  ?Component Value Date  ? INSULIN 266.0 (H) 08/12/2021  ? INSULIN 126.0 (H) 06/04/2021  ? INSULIN 185.0 (H) 08/06/2020  ? INSULIN 83.4 (H) 01/03/2020  ? INSULIN 48.4 (H) 09/13/2019  ? ?Lab Results  ?Component Value Date  ? TSH 2.200 09/26/2018  ? ?Lab Results  ?Component Value Date  ? CHOL 137 08/12/2021  ? HDL 33 (L) 08/12/2021  ? [place] 33 08/12/2021  ? TRIG 504 (H) 08/12/2021  ? CHOLHDL 3.4 08/24/2017  ? ?Lab Results  ?Component Value Date  ? VD25OH 42.8 08/12/2021  ? VD25OH 42.6 06/04/2021  ? VD25OH 36.6 12/25/2020  ? ?Lab Results  ?Component Value Date  ? WBC 6.0 05/15/2019  ? HGB 17.3 05/15/2019  ? HCT 51.1 (H) 05/15/2019  ? MCV 81 05/15/2019  ? PLT 163 05/15/2019  ? ?No results found for: IRON, TIBC, FERRITIN ? ?Obesity Behavioral Intervention:  ? ?Approximately 15 minutes were spent on the discussion below. ? ?ASK: ?We discussed the diagnosis of obesity with Brandon [name] today and Brandon [name] agreed to give [place] permission to discuss obesity behavioral modification therapy today. ? ?ASSESS: ?Brandon [name] has the diagnosis of obesity and his BMI today is 41.4. Brandon [name] is in the action stage of change.  ? ?ADVISE: ?Brandon [name] was educated on the multiple health risks of obesity as well as the benefit of weight loss to improve his health. He was advised of the need for long term treatment and the importance of lifestyle modifications to improve his current health and to decrease his risk of future health problems. ? ?AGREE: ?Multiple dietary modification options and treatment options were discussed and Brandon [name] agreed to follow the recommendations documented in the above note. ? ?ARRANGE: ?Brandon [name] was educated on the importance of frequent visits to treat obesity as outlined per CMS and USPSTF guidelines and agreed to schedule his next follow up appointment today. ? ?Attestation Statements:  ? ?Reviewed  by clinician on day of visit: allergies, medications, problem list, medical history, surgical history, family history, social history, and previous encounter notes. ? ? ?I, Trixie Dredge, am acting as transcriptionist for [employer], DO. ? ?I have reviewed the above documentation for accuracy and completeness, and I agree with the above. Marjory Sneddon, D.O. ? ?The [place] was signed into law in 2016 which includes the topic of electronic health records.  This provides immediate access to information in MyChart.  This includes consultation notes, operative notes, office notes, lab results and pathology reports.  If you have any questions about what you read please let us know at your next visit so we can discuss your concerns and take corrective action if need be.  We are right here with you. ? ? ?

## 2021-11-27 ENCOUNTER — Encounter (INDEPENDENT_AMBULATORY_CARE_PROVIDER_SITE_OTHER): Payer: Self-pay | Admitting: Family Medicine

## 2021-11-27 ENCOUNTER — Ambulatory Visit (INDEPENDENT_AMBULATORY_CARE_PROVIDER_SITE_OTHER): Payer: 59 | Admitting: Family Medicine

## 2021-11-27 VITALS — BP 116/72 | HR 86 | Temp 98.0°F | Ht 68.0 in | Wt 279.0 lb

## 2021-11-27 DIAGNOSIS — I10 Essential (primary) hypertension: Secondary | ICD-10-CM

## 2021-11-27 DIAGNOSIS — Z7985 Long-term (current) use of injectable non-insulin antidiabetic drugs: Secondary | ICD-10-CM

## 2021-11-27 DIAGNOSIS — E119 Type 2 diabetes mellitus without complications: Secondary | ICD-10-CM

## 2021-11-27 DIAGNOSIS — Z6841 Body Mass Index (BMI) 40.0 and over, adult: Secondary | ICD-10-CM | POA: Diagnosis not present

## 2021-11-27 DIAGNOSIS — E669 Obesity, unspecified: Secondary | ICD-10-CM | POA: Diagnosis not present

## 2021-11-27 MED ORDER — OZEMPIC (0.25 OR 0.5 MG/DOSE) 2 MG/1.5ML ~~LOC~~ SOPN: 0.5000 mg | PEN_INJECTOR | SUBCUTANEOUS | 0 refills | Status: DC

## 2021-12-09 NOTE — Progress Notes (Signed)
Chief Complaint:   OBESITY Brandon [name] is here to discuss his progress with his obesity treatment plan along with follow-up of his obesity related diagnoses. Brandon [name] is on the Category 4 Plan and states he is following his eating plan approximately 30% of the time. Brandon [name] states he is walking for 10 minutes 3 times per week.  Today's visit was #: 20 Starting weight: 305 lbs Starting date: 09/15/2017 Today's weight: 279 lbs Today's date: 11/27/2021 Total lbs lost to date: 26 Total lbs lost since last in-office visit: 0  Interim History: Brandon [name] lost approximately 10 lbs last month, much of which was water weight. He has regained some of the water weight. He will be traveling soon and he anticipates extra challenges.   Subjective:   1. Type 2 diabetes mellitus without complication, without long-term current use of insulin ([facility]) Brandon [name]'s recent A1c was 6.1. He is on metformin and Ozempic.  2. Essential hypertension Brandon [name]'s blood pressure is well controlled on his medications.  Assessment/Plan:   1. Type 2 diabetes mellitus without complication, without long-term current use of insulin ([facility]) Brandon [name] will continue metformin and Ozempic, and we will refill Ozempic for 1 month.  - Semaglutide,0.25 or 0.5MG /DOS, (OZEMPIC, 0.25 OR 0.5 MG/DOSE,) 2 MG/1.5ML SOPN; Inject 0.5 mg into the skin once a week.  Dispense: 3 mL; Refill: 0  2. Essential hypertension Brandon [name] will continue his diet and exercise to improve blood pressure control. He will watch for signs of hypotension as he continues his lifestyle modifications.  3. Obesity, Current BMI 42.4 Brandon [name] is currently in the action stage of change. As such, his goal is to continue with weight loss efforts. He has agreed to the Category 4 Plan.   Exercise goals: As is.   Behavioral modification strategies: increasing lean protein intake, increasing water intake, and no skipping meals.  Brandon [name] has agreed to follow-up with our clinic  in 4 weeks. He was informed of the importance of frequent follow-up visits to maximize his success with intensive lifestyle modifications for his multiple health conditions.   Objective:   Blood pressure 116/72, pulse 86, temperature 98 F (36.7 C), height 5\' 8"  (1.727 m), weight 279 lb (126.6 kg), SpO2 97 %. Body mass index is 42.42 kg/m.  General: Cooperative, alert, well developed, in no acute distress. HEENT: Conjunctivae and lids unremarkable. Cardiovascular: Regular rhythm.  Lungs: Normal work of breathing. Neurologic: No focal deficits.   Lab Results  Component Value Date   CREATININE 0.79 08/12/2021   BUN 15 08/12/2021   NA 142 08/12/2021   K 3.3 (L) 08/12/2021   CL 97 08/12/2021   CO2 26 08/12/2021   Lab Results  Component Value Date   ALT 81 (H) 08/12/2021   AST 36 08/12/2021   ALKPHOS 57 08/12/2021   BILITOT 0.5 08/12/2021   Lab Results  Component Value Date   HGBA1C 6.7 (H) 08/12/2021   HGBA1C 7.7 (H) 06/04/2021   HGBA1C 7.6 (H) 12/25/2020   HGBA1C 6.7 (H) 08/06/2020   HGBA1C 5.8 (H) 01/03/2020   Lab Results  Component Value Date   INSULIN 266.0 (H) 08/12/2021   INSULIN 126.0 (H) 06/04/2021   INSULIN 185.0 (H) 08/06/2020   INSULIN 83.4 (H) 01/03/2020   INSULIN 48.4 (H) 09/13/2019   Lab Results  Component Value Date   TSH 2.200 09/26/2018   Lab Results  Component Value Date   CHOL 137 08/12/2021   HDL 33 (L) 08/12/2021   LDLCALC 33 08/12/2021   TRIG 504 (H)  08/12/2021   CHOLHDL 3.4 08/24/2017   Lab Results  Component Value Date   VD25OH 42.8 08/12/2021   VD25OH 42.6 06/04/2021   VD25OH 36.6 12/25/2020   Lab Results  Component Value Date   WBC 6.0 05/15/2019   HGB 17.3 05/15/2019   HCT 51.1 (H) 05/15/2019   MCV 81 05/15/2019   PLT 163 05/15/2019   No results found for: IRON, TIBC, FERRITIN  Obesity Behavioral Intervention:   Approximately 15 minutes were spent on the discussion below.  ASK: We discussed the diagnosis of obesity  with Brandon [name] today and Brandon [name] agreed to give [place] permission to discuss obesity behavioral modification therapy today.  ASSESS: Brandon [name] has the diagnosis of obesity and his BMI today is 42.4. Brandon [name] is in the action stage of change.   ADVISE: Brandon [name] was educated on the multiple health risks of obesity as well as the benefit of weight loss to improve his health. He was advised of the need for long term treatment and the importance of lifestyle modifications to improve his current health and to decrease his risk of future health problems.  AGREE: Multiple dietary modification options and treatment options were discussed and Brandon [name] agreed to follow the recommendations documented in the above note.  ARRANGE: Brandon [name] was educated on the importance of frequent visits to treat obesity as outlined per CMS and USPSTF guidelines and agreed to schedule his next follow up appointment today.  Attestation Statements:   Reviewed by clinician on day of visit: allergies, medications, problem list, medical history, surgical history, family history, social history, and previous encounter notes.   I, Trixie Dredge, am acting as transcriptionist for Dennard Nip, MD.  I have reviewed the above documentation for accuracy and completeness, and I agree with the above. -  Dennard Nip, MD

## 2021-12-31 ENCOUNTER — Ambulatory Visit (INDEPENDENT_AMBULATORY_CARE_PROVIDER_SITE_OTHER): Payer: Medicare Other | Admitting: Family Medicine

## 2022-01-09 ENCOUNTER — Encounter (HOSPITAL_BASED_OUTPATIENT_CLINIC_OR_DEPARTMENT_OTHER): Payer: Self-pay | Admitting: Orthopedic Surgery

## 2022-01-12 NOTE — H&P (Signed)
PREOPERATIVE H&P  Chief Complaint: LEFT SHOULDER ADHESIVE CAPSULITIS  HPI: Brandon [name] is a 51 y.o. male who presents with a diagnosis of LEFT SHOULDER ADHESIVE CAPSULITIS. Symptoms are rated as moderate to severe, and have been worsening.  This is significantly impairing activities of daily living.  He has elected for surgical management.   Past Medical History:  Diagnosis Date   ADEM (acute disseminated encephalomyelitis) 2014   dx 2014  symptoms started at age 37,  tremors, gait disturbance, weakness (previous seen by neurologist-- dr [facility])   Adhesive capsulitis of left shoulder    Ambulates with cane    Anxiety 2005   Chronic constipation    Complication of anesthesia    Depression 2005   Difficult intubation    difficult intubating and exbutating "throat Clinched around tube" during an Endoscopy procedure   Dyspnea    with exertion   ED (erectile dysfunction)    Headache(784.0)    Hearing loss    History of transient ischemic attack (TIA) 2000   no residual   Hyperlipidemia    Hypertension    OSA on CPAP    Type 2 diabetes mellitus (HCC)    Past Surgical History:  Procedure Laterality Date   ANTERIOR CERVICAL DECOMP/DISCECTOMY FUSION N/A 10/21/2016   Procedure: ANTERIOR CERVICAL DECOMPRESSION/DISCECTOMY FUSION, INTERBODY PROSTHESIS,PLATE CERVICAL FOUR- CERVICAL FIVE, CERVICAL FIVE- CERVICAL SIX;  Surgeon: Tressie Stalker, MD;  Location: [facility] OR;  Service: Neurosurgery;  Laterality: N/A;   ANTERIOR CERVICAL DECOMP/DISCECTOMY FUSION N/A 05/19/2018   Procedure: ANTERIOR CERVICAL DECOMPRESSION/DISCECTOMY FUSION, INTERBODY PROSTHESIS,PLATE/SCREWS CERVICAL SIX- CERVICAL SEVEN; EXPLORE FUSION;  Surgeon: Tressie Stalker, MD;  Location: [facility] OR;  Service: Neurosurgery;  Laterality: N/A;   CHOLECYSTECTOMY  08/02/2011   Procedure: LAPAROSCOPIC CHOLECYSTECTOMY;  Surgeon: Shelly Rubenstein, MD;  Location: [facility] OR;  Service: General;;   COLONOSCOPY  10/2020    ESOPHAGOGASTRODUODENOSCOPY  07/28/2011   Procedure: ESOPHAGOGASTRODUODENOSCOPY (EGD);  Surgeon: [name], MD;  Location: [facility] ENDOSCOPY;  Service: Endoscopy;  Laterality: N/A;   TONSILECTOMY/ADENOIDECTOMY WITH MYRINGOTOMY Bilateral 1981   TYMPANOSTOMY TUBE PLACEMENT Bilateral 1979   Social History   Socioeconomic History   Marital status: Media planner    Spouse name: Not on file   Number of children: 0   Years of education: college   Highest education level: Not on file  Occupational History   Occupation: disabled   Tobacco Use   Smoking status: Former    Packs/day: 3.00    Years: 3.00    Total pack years: 9.00    Types: Cigarettes    Quit date: 08/18/1987    Years since quitting: 34.4   Smokeless tobacco: Never  Vaping Use   Vaping Use: Never used  Substance and Sexual Activity   Alcohol use: Not Currently    Alcohol/week: 0.0 standard drinks of alcohol    Comment: 07/04/11 "glass of wine or spirits once a month"   Drug use: Not Currently    Types: Marijuana    Comment: have not used in a 1.5 years   Sexual activity: Yes    Partners: Female  Other Topics Concern   Not on file  Social History Narrative   Lives in [place] with 2 domestic male partners "his family unit"   Caffeine: minimal   Social Determinants of Health   Financial Resource Strain: Not on file  Food Insecurity: Not on file  Transportation Needs: Not on file  Physical Activity: Not on file  Stress: Not on file  Social  Connections: Not on file   Family History  Problem Relation Age of Onset   Diabetes Mother    Obesity Mother    Coronary artery disease Father    Stroke Father    Cancer Father    High blood pressure Father    Alcoholism Father    Cancer Maternal Grandmother        bone   Cancer Other        Stomach -Great-great grandmother   Sleep apnea Neg Hx    Allergies  Allergen Reactions   Amoxicillin Anaphylaxis    Has patient had a PCN reaction causing immediate rash,  facial/tongue/throat swelling, SOB or lightheadedness with hypotension: Yes Has patient had a PCN reaction causing severe rash involving mucus membranes or skin necrosis: No Has patient had a PCN reaction that required hospitalization: No Has patient had a PCN reaction occurring within the last 10 years: No If all of the above answers are "NO", then may proceed with Cephalosporin use.    Penicillin G Anaphylaxis    Gets extremely high fevers. Has patient had a PCN reaction causing immediate rash, facial/tongue/throat swelling, SOB or lightheadedness with hypotension: Yes Has patient had a PCN reaction causing severe rash involving mucus membranes or skin necrosis: No Has patient had a PCN reaction that required hospitalization: No Has patient had a PCN reaction occurring within the last 10 years: No If all of the above answers are "NO", then may proceed with Cephalosporin use.    Sulfa Antibiotics Anaphylaxis    It will kill him   Cephalosporins Other (See Comments)    Reaction unspecified.   Quinolones Other (See Comments)    Reaction unspecified   Sulfamethoxazole Swelling   Prior to Admission medications   Medication Sig Start Date End Date Taking? Authorizing Provider  atorvastatin (LIPITOR) 20 MG tablet TAKE 1 TABLET (20 MG TOTAL) BY MOUTH AT BEDTIME. Patient taking differently: Take 20 mg by mouth at bedtime. 03/10/18   Weber, Damaris Hippo, PA-C  chlorthalidone (HYGROTON) 25 MG tablet Take 25 mg by mouth daily. 07/28/21   [provider]  colesevelam (WELCHOL) 625 MG tablet Take 3 tablets (1,875 mg total) by mouth daily with breakfast. 02/28/18   Gale Journey, Damaris Hippo, PA-C  cyclobenzaprine (FLEXERIL) 10 MG tablet Take 10 mg by mouth 3 (three) times daily as needed. 10/27/21   [provider]  gabapentin (NEURONTIN) 300 MG capsule Take 3 capsules (900 mg total) by mouth 2 (two) times daily. 08/24/17   Weber, Damaris Hippo, PA-C  Insulin Pen Needle (BD PEN NEEDLE NANO U/F) 32G X 4 MM  MISC 1 Package by Does not apply route 2 (two) times daily. 03/31/21   Dennard Nip D, MD  ketoconazole (NIZORAL) 2 % cream Apply topically 2 (two) times daily. 10/27/21   [provider]  lisinopril (ZESTRIL) 20 MG tablet Take 20 mg by mouth daily. 11/15/21   [provider]  meloxicam (MOBIC) 7.5 MG tablet Take 7.5 mg by mouth 2 (two) times daily. 11/15/21   [provider]  metFORMIN (GLUCOPHAGE-XR) 500 MG 24 hr tablet TAKE 2 TABLETS (1,000 MG TOTAL) BY MOUTH 2 (TWO) TIMES DAILY. Patient taking differently: Take 1,000 mg by mouth 2 (two) times daily. TAKE 2 TABLETS (1,000 MG TOTAL) BY MOUTH 2 (TWO) TIMES DAILY. 03/25/18   Horald Pollen, MD  NON FORMULARY CPAP machine at bedtime & sleep    [provider]  potassium chloride (KLOR-CON) 10 MEQ tablet Take 10 mEq by  mouth daily. 11/27/21   [provider]  Semaglutide,0.25 or 0.5MG /DOS, (OZEMPIC, 0.25 OR 0.5 MG/DOSE,) 2 MG/1.5ML SOPN Inject 0.5 mg into the skin once a week. 11/27/21   Quillian Quince D, MD  tadalafil (CIALIS) 10 MG tablet Take 10 mg by mouth as needed. 11/17/21   [provider]  Vitamin D, Ergocalciferol, (DRISDOL) 1.25 MG (50000 UNIT) CAPS capsule TAKE 1 CAPSULE BY MOUTH EVERY 3 DAYS 09/11/21   Quillian Quince D, MD     Positive ROS: All other systems have been reviewed and were otherwise negative with the exception of those mentioned in the HPI and as above.  Physical Exam: General: Alert, no acute distress Cardiovascular: No pedal edema Respiratory: No cyanosis, no use of accessory musculature GI: No organomegaly, abdomen is soft and non-tender Skin: No lesions in the area of chief complaint Neurologic: Sensation intact distally Psychiatric: Patient is competent for consent with normal mood and affect Lymphatic: No axillary or cervical lymphadenopathy  MUSCULOSKELETAL: non-TTP left shoulder, AROM and PROM limited to 90 degrees of forward flexion, minimal internal and  external rotation, decreased strength, incisions c/d/I, NVI   Imaging: n/a   Assessment: LEFT SHOULDER ADHESIVE CAPSULITIS  Plan: Plan for Procedure(s): CLOSED MANIPULATION SHOULDER  The risks benefits and alternatives were discussed with the patient including but not limited to the risks of nonoperative treatment, versus surgical intervention including infection, bleeding, nerve injury,  blood clots, cardiopulmonary complications, morbidity, mortality, among others, and they were willing to proceed.   Weightbearing: WBAT LUE Orthopedic devices: none Showering: no restrictions Dressing: n/a Medicines: dose pack  Discharge: home Follow up: 2 weeks    Marzetta Board Office [phone] 01/12/2022 3:28 PM

## 2022-01-13 ENCOUNTER — Encounter (HOSPITAL_BASED_OUTPATIENT_CLINIC_OR_DEPARTMENT_OTHER): Payer: Self-pay | Admitting: Orthopedic Surgery

## 2022-01-13 ENCOUNTER — Other Ambulatory Visit: Payer: Self-pay

## 2022-01-13 NOTE — Progress Notes (Addendum)
Spoke w/ via phone for pre-op interview---pt Lab needs dos----  [employer], ekg           Lab results------ COVID test -----patient states asymptomatic no test needed Arrive at ------ 630 am-01-20-2022 NPO after MN NO Solid Food.  Clear liquids from MN until---530 am Med rec completed Medications to take morning of surgery -----clonidine, cyclobenzapriine Diabetic medication -----none day of surgery Patient instructed no nail polish to be worn day of surgery Patient instructed to bring photo id and insurance card day of surgery Patient aware to have Driver (ride ) / caregiver  susan jones   for 24 hours after surgery  Patient Special Instructions -----none Pre-Op special Istructions ----bring cpap mask tubing and machine and leave in car Patient verbalized understanding of instructions that were given at this phone interview. Patient denies shortness of breath, chest pain, fever, cough at this phone interview.   Pt has adem  previously  saw dr Chestine Spore piyran at [facility] (pt states he is to follow up every 10 yrs with neurology for adem)  Last cervical neck surgery 05-19-2018 anesthesia record on chart/epic

## 2022-01-19 NOTE — Anesthesia Preprocedure Evaluation (Signed)
Anesthesia Evaluation  Patient identified by MRN, date of birth, ID band Patient awake    Reviewed: Allergy & Precautions, NPO status , Patient's Chart, lab work & pertinent test results  History of Anesthesia Complications (+) history of anesthetic complications (pt reports GI had difficult time during esophagoscopy)  Airway Mallampati: II  TM Distance: >3 FB Neck ROM: Full    Dental  (+) Dental Advisory Given   Pulmonary sleep apnea and Continuous Positive Airway Pressure Ventilation , former smoker,    breath sounds clear to auscultation       Cardiovascular hypertension, Pt. on medications (-) angina Rhythm:Regular Rate:Normal     Neuro/Psych  Headaches, Anxiety Depression Gait disturbance from encephalomyelitis TIA   GI/Hepatic negative GI ROS, Neg liver ROS,   Endo/Other  diabetes, Insulin Dependent, Oral Hypoglycemic AgentsMorbid obesity  Renal/GU negative Renal ROS     Musculoskeletal  (+) Arthritis ,   Abdominal (+) + obese,   Peds  Hematology negative hematology ROS (+)   Anesthesia Other Findings   Reproductive/Obstetrics                            Anesthesia Physical Anesthesia Plan  ASA: 3  Anesthesia Plan: General   Post-op Pain Management: Tylenol PO (pre-op)*   Induction: Intravenous  PONV Risk Score and Plan: 2 and Ondansetron and Dexamethasone  Airway Management Planned: LMA  Additional Equipment: None  Intra-op Plan:   Post-operative Plan:   Informed Consent: I have reviewed the patients History and Physical, chart, labs and discussed the procedure including the risks, benefits and alternatives for the proposed anesthesia with the patient or authorized representative who has indicated his/her understanding and acceptance.     Dental advisory given  Plan Discussed with: CRNA and Surgeon  Anesthesia Plan Comments: (Dr. Eulah Pont requests no block)        Anesthesia Quick Evaluation

## 2022-01-20 ENCOUNTER — Encounter (HOSPITAL_BASED_OUTPATIENT_CLINIC_OR_DEPARTMENT_OTHER): Payer: Self-pay | Admitting: Orthopedic Surgery

## 2022-01-20 ENCOUNTER — Ambulatory Visit (HOSPITAL_BASED_OUTPATIENT_CLINIC_OR_DEPARTMENT_OTHER): Payer: 59 | Admitting: Anesthesiology

## 2022-01-20 ENCOUNTER — Ambulatory Visit (HOSPITAL_BASED_OUTPATIENT_CLINIC_OR_DEPARTMENT_OTHER)
Admission: RE | Admit: 2022-01-20 | Discharge: 2022-01-20 | Disposition: A | Discharge status: Home / Self Care | Payer: 59 | Attending: Orthopedic Surgery | Admitting: Orthopedic Surgery

## 2022-01-20 ENCOUNTER — Encounter (HOSPITAL_BASED_OUTPATIENT_CLINIC_OR_DEPARTMENT_OTHER)
Admission: RE | Disposition: A | Discharge status: Home / Self Care | Payer: Self-pay | Source: Home / Self Care | Attending: Orthopedic Surgery

## 2022-01-20 DIAGNOSIS — E119 Type 2 diabetes mellitus without complications: Secondary | ICD-10-CM | POA: Insufficient documentation

## 2022-01-20 DIAGNOSIS — Z7984 Long term (current) use of oral hypoglycemic drugs: Secondary | ICD-10-CM | POA: Diagnosis not present

## 2022-01-20 DIAGNOSIS — Z87891 Personal history of nicotine dependence: Secondary | ICD-10-CM | POA: Insufficient documentation

## 2022-01-20 DIAGNOSIS — M7502 Adhesive capsulitis of left shoulder: Secondary | ICD-10-CM | POA: Diagnosis not present

## 2022-01-20 DIAGNOSIS — I10 Essential (primary) hypertension: Secondary | ICD-10-CM | POA: Insufficient documentation

## 2022-01-20 DIAGNOSIS — M1990 Unspecified osteoarthritis, unspecified site: Secondary | ICD-10-CM | POA: Diagnosis not present

## 2022-01-20 DIAGNOSIS — Z6841 Body Mass Index (BMI) 40.0 and over, adult: Secondary | ICD-10-CM | POA: Diagnosis not present

## 2022-01-20 DIAGNOSIS — Z8673 Personal history of transient ischemic attack (TIA), and cerebral infarction without residual deficits: Secondary | ICD-10-CM | POA: Insufficient documentation

## 2022-01-20 DIAGNOSIS — Z01818 Encounter for other preprocedural examination: Secondary | ICD-10-CM

## 2022-01-20 DIAGNOSIS — G4733 Obstructive sleep apnea (adult) (pediatric): Secondary | ICD-10-CM | POA: Insufficient documentation

## 2022-01-20 DIAGNOSIS — Z79899 Other long term (current) drug therapy: Secondary | ICD-10-CM | POA: Diagnosis not present

## 2022-01-20 DIAGNOSIS — Z794 Long term (current) use of insulin: Secondary | ICD-10-CM

## 2022-01-20 HISTORY — DX: Male erectile dysfunction, unspecified: N52.9

## 2022-01-20 HISTORY — PX: SHOULDER CLOSED REDUCTION: SHX1051

## 2022-01-20 HISTORY — DX: Other constipation: K59.09

## 2022-01-20 HISTORY — DX: Disorder of the skin and subcutaneous tissue, unspecified: L98.9

## 2022-01-20 HISTORY — DX: Adhesive capsulitis of left shoulder: M75.02

## 2022-01-20 HISTORY — DX: Type 2 diabetes mellitus without complications: E11.9

## 2022-01-20 HISTORY — DX: Dependence on other enabling machines and devices: Z99.89

## 2022-01-20 HISTORY — DX: Varicella without complication: B01.9

## 2022-01-20 LAB — POCT I-STAT, CHEM 8
BUN: 17 mg/dL (ref 6–20)
Calcium, Ion: 1.16 mmol/L (ref 1.15–1.40)
Chloride: 98 mmol/L (ref 98–111)
Creatinine, Ser: 1.1 mg/dL (ref 0.61–1.24)
Glucose, Bld: 100 mg/dL — ABNORMAL HIGH (ref 70–99)
HCT: 48 % (ref 39.0–52.0)
Hemoglobin: 16.3 g/dL (ref 13.0–17.0)
Potassium: 3.4 mmol/L — ABNORMAL LOW (ref 3.5–5.1)
Sodium: 141 mmol/L (ref 135–145)
TCO2: 29 mmol/L (ref 22–32)

## 2022-01-20 LAB — GLUCOSE, CAPILLARY: Glucose-Capillary: 111 mg/dL — ABNORMAL HIGH (ref 70–99)

## 2022-01-20 SURGERY — MANIPULATION, JOINT, SHOULDER, WITH ANESTHESIA
Anesthesia: General | Site: Shoulder | Laterality: Left

## 2022-01-20 MED ORDER — DEXAMETHASONE SODIUM PHOSPHATE 10 MG/ML IJ SOLN
INTRAMUSCULAR | Status: DC | PRN
  Administered 2022-01-20: 10 mg via INTRAVENOUS

## 2022-01-20 MED ORDER — TRIAMCINOLONE ACETONIDE 40 MG/ML IJ SUSP
INTRAMUSCULAR | Status: AC
  Filled 2022-01-20: qty 1

## 2022-01-20 MED ORDER — CYCLOBENZAPRINE HCL 10 MG PO TABS: 10.0000 mg | ORAL_TABLET | Freq: Three times a day (TID) | ORAL | 0 refills | Status: AC | PRN

## 2022-01-20 MED ORDER — HYDROMORPHONE HCL 1 MG/ML IJ SOLN
0.2500 mg | INTRAMUSCULAR | Status: DC | PRN
  Administered 2022-01-20 (×2): 0.5 mg via INTRAVENOUS

## 2022-01-20 MED ORDER — BUPIVACAINE-EPINEPHRINE 0.5% -1:200000 IJ SOLN
INTRAMUSCULAR | Status: AC
  Filled 2022-01-20: qty 1

## 2022-01-20 MED ORDER — MIDAZOLAM HCL 2 MG/2ML IJ SOLN
INTRAMUSCULAR | Status: DC | PRN
  Administered 2022-01-20: 2 mg via INTRAVENOUS

## 2022-01-20 MED ORDER — HYDROMORPHONE HCL 1 MG/ML IJ SOLN
INTRAMUSCULAR | Status: AC
  Filled 2022-01-20: qty 1

## 2022-01-20 MED ORDER — MIDAZOLAM HCL 2 MG/2ML IJ SOLN: 0.5000 mg | Freq: Once | INTRAMUSCULAR | Status: DC | PRN

## 2022-01-20 MED ORDER — LIDOCAINE HCL (PF) 2 % IJ SOLN
INTRAMUSCULAR | Status: AC
  Filled 2022-01-20: qty 5

## 2022-01-20 MED ORDER — METHYLPREDNISOLONE ACETATE 80 MG/ML IJ SUSP
INTRAMUSCULAR | Status: DC | PRN
  Administered 2022-01-20: 80 mg via INTRA_ARTICULAR

## 2022-01-20 MED ORDER — DEXMEDETOMIDINE (PRECEDEX) IN NS 20 MCG/5ML (4 MCG/ML) IV SYRINGE
PREFILLED_SYRINGE | INTRAVENOUS | Status: DC | PRN
  Administered 2022-01-20: 8 ug via INTRAVENOUS

## 2022-01-20 MED ORDER — OXYCODONE HCL 5 MG PO TABS
5.0000 mg | ORAL_TABLET | Freq: Once | ORAL | Status: AC | PRN
  Administered 2022-01-20: 5 mg via ORAL

## 2022-01-20 MED ORDER — OXYCODONE HCL 5 MG/5ML PO SOLN: 5.0000 mg | Freq: Once | ORAL | Status: AC | PRN

## 2022-01-20 MED ORDER — LIDOCAINE 2% (20 MG/ML) 5 ML SYRINGE
INTRAMUSCULAR | Status: DC | PRN
  Administered 2022-01-20: 20 mg via INTRAVENOUS

## 2022-01-20 MED ORDER — MIDAZOLAM HCL 2 MG/2ML IJ SOLN
INTRAMUSCULAR | Status: AC
  Filled 2022-01-20: qty 2

## 2022-01-20 MED ORDER — DEXAMETHASONE SODIUM PHOSPHATE 10 MG/ML IJ SOLN: 8.0000 mg | Freq: Once | INTRAMUSCULAR | Status: DC

## 2022-01-20 MED ORDER — OXYCODONE HCL 5 MG PO TABS
ORAL_TABLET | ORAL | Status: AC
  Filled 2022-01-20: qty 1

## 2022-01-20 MED ORDER — PROMETHAZINE HCL 25 MG/ML IJ SOLN: 6.2500 mg | INTRAMUSCULAR | Status: DC | PRN

## 2022-01-20 MED ORDER — ACETAMINOPHEN 500 MG PO TABS
ORAL_TABLET | ORAL | Status: AC
  Filled 2022-01-20: qty 2

## 2022-01-20 MED ORDER — ONDANSETRON HCL 4 MG/2ML IJ SOLN
INTRAMUSCULAR | Status: DC | PRN
  Administered 2022-01-20: 4 mg via INTRAVENOUS

## 2022-01-20 MED ORDER — ONDANSETRON HCL 4 MG/2ML IJ SOLN
INTRAMUSCULAR | Status: AC
  Filled 2022-01-20: qty 2

## 2022-01-20 MED ORDER — OXYCODONE HCL 5 MG PO TABS: ORAL_TABLET | ORAL | 0 refills | Status: DC

## 2022-01-20 MED ORDER — MEPERIDINE HCL 25 MG/ML IJ SOLN: 6.2500 mg | INTRAMUSCULAR | Status: DC | PRN

## 2022-01-20 MED ORDER — BUPIVACAINE-EPINEPHRINE 0.5% -1:200000 IJ SOLN
INTRAMUSCULAR | Status: DC | PRN
  Administered 2022-01-20: 3 mL

## 2022-01-20 MED ORDER — DEXAMETHASONE SODIUM PHOSPHATE 10 MG/ML IJ SOLN
INTRAMUSCULAR | Status: AC
  Filled 2022-01-20: qty 1

## 2022-01-20 MED ORDER — FENTANYL CITRATE (PF) 100 MCG/2ML IJ SOLN
INTRAMUSCULAR | Status: DC | PRN
  Administered 2022-01-20: 100 ug via INTRAVENOUS

## 2022-01-20 MED ORDER — ACETAMINOPHEN 500 MG PO TABS: 1000.0000 mg | ORAL_TABLET | Freq: Four times a day (QID) | ORAL | 2 refills | Status: AC | PRN

## 2022-01-20 MED ORDER — ARTIFICIAL TEARS OPHTHALMIC OINT
TOPICAL_OINTMENT | OPHTHALMIC | Status: AC
  Filled 2022-01-20: qty 3.5

## 2022-01-20 MED ORDER — POVIDONE-IODINE 10 % EX SWAB: 2.0000 | Freq: Once | CUTANEOUS | Status: DC

## 2022-01-20 MED ORDER — LACTATED RINGERS IV SOLN: INTRAVENOUS | Status: DC

## 2022-01-20 MED ORDER — PROPOFOL 10 MG/ML IV BOLUS
INTRAVENOUS | Status: DC | PRN
  Administered 2022-01-20: 200 mg via INTRAVENOUS

## 2022-01-20 MED ORDER — ACETAMINOPHEN 500 MG PO TABS
1000.0000 mg | ORAL_TABLET | Freq: Once | ORAL | Status: AC
  Administered 2022-01-20: 1000 mg via ORAL

## 2022-01-20 MED ORDER — ROCURONIUM BROMIDE 10 MG/ML (PF) SYRINGE
PREFILLED_SYRINGE | INTRAVENOUS | Status: AC
  Filled 2022-01-20: qty 10

## 2022-01-20 MED ORDER — METHYLPREDNISOLONE ACETATE 80 MG/ML IJ SUSP
INTRAMUSCULAR | Status: AC
  Filled 2022-01-20: qty 1

## 2022-01-20 MED ORDER — FENTANYL CITRATE (PF) 250 MCG/5ML IJ SOLN
INTRAMUSCULAR | Status: AC
  Filled 2022-01-20: qty 5

## 2022-01-20 MED ORDER — ONDANSETRON HCL 4 MG PO TABS: 4.0000 mg | ORAL_TABLET | Freq: Three times a day (TID) | ORAL | 0 refills | Status: AC | PRN

## 2022-01-20 MED ORDER — PROPOFOL 500 MG/50ML IV EMUL
INTRAVENOUS | Status: AC
  Filled 2022-01-20: qty 50

## 2022-01-20 SURGICAL SUPPLY — 14 items
BNDG ADH 1X3 SHEER STRL LF (GAUZE/BANDAGES/DRESSINGS) ×2 IMPLANT
BNDG ADH THN 3X1 STRL LF (GAUZE/BANDAGES/DRESSINGS) ×1
GAUZE SPONGE 4X4 12PLY STRL (GAUZE/BANDAGES/DRESSINGS) IMPLANT
GLOVE BIO SURGEON STRL SZ7.5 (GLOVE) ×1 IMPLANT
GLOVE BIOGEL PI IND STRL 8 (GLOVE) ×1 IMPLANT
GLOVE BIOGEL PI INDICATOR 8 (GLOVE)
KIT TURNOVER CYSTO (KITS) ×2 IMPLANT
NDL SAFETY ECLIPSE 18X1.5 (NEEDLE) ×1 IMPLANT
NDL SPNL 22GX3.5 QUINCKE BK (NEEDLE) IMPLANT
NEEDLE HYPO 18GX1.5 SHARP (NEEDLE) ×2
NEEDLE SPNL 22GX3.5 QUINCKE BK (NEEDLE) IMPLANT
PAD ALCOHOL SWAB (MISCELLANEOUS) ×4 IMPLANT
SWABSTICK POVIDONE IODINE SNGL (MISCELLANEOUS) IMPLANT
SYR CONTROL 10ML LL (SYRINGE) ×4 IMPLANT

## 2022-01-20 NOTE — Anesthesia Postprocedure Evaluation (Signed)
Anesthesia Post Note  Patient: Brandon [name]  Procedure(s) Performed: CLOSED MANIPULATION SHOULDER (Left: Shoulder)     Patient location during evaluation: PACU Anesthesia Type: General Level of consciousness: awake and alert, patient cooperative and oriented Pain management: pain level controlled Vital Signs Assessment: post-procedure vital signs reviewed and stable Respiratory status: spontaneous breathing, nonlabored ventilation and respiratory function stable Cardiovascular status: blood pressure returned to baseline and stable Postop Assessment: no apparent nausea or vomiting, able to ambulate and adequate PO intake Anesthetic complications: no   No notable events documented.  Last Vitals:  Vitals:   01/20/22 0950 01/20/22 1015  BP:  (!) 145/87  Pulse: 89 88  Resp: 16 17  Temp:  36.4 C  SpO2: 93% 96%    Last Pain:  Vitals:   01/20/22 1035  TempSrc:   PainSc: 2                  Demonte Dobratz,E. Kenedee Molesky

## 2022-01-20 NOTE — Anesthesia Procedure Notes (Signed)
Procedure Name: LMA Insertion Date/Time: 01/20/2022 9:00 AM  Performed by: Norva Pavlov, CRNAPre-anesthesia Checklist: Patient identified, Emergency Drugs available, Suction available and Patient being monitored Patient Re-evaluated:Patient Re-evaluated prior to induction Oxygen Delivery Method: Circle system utilized Preoxygenation: Pre-oxygenation with 100% oxygen Induction Type: IV induction Ventilation: Mask ventilation without difficulty LMA: LMA inserted LMA Size: 5.0 Number of attempts: 1 Airway Equipment and Method: Bite block Placement Confirmation: positive ETCO2 Tube secured with: Tape Dental Injury: Teeth and Oropharynx as per pre-operative assessment

## 2022-01-20 NOTE — Transfer of Care (Signed)
Immediate Anesthesia Transfer of Care Note  Patient: Brandon [name]  Procedure(s) Performed: Procedure(s) (LRB): CLOSED MANIPULATION SHOULDER (Left)  Patient Location: PACU  Anesthesia Type: General  Level of Consciousness: drowsy, follows commands  Airway & Oxygen Therapy: Patient Spontanous Breathing and Patient connected to face mask oxygen  Post-op Assessment: Report given to PACU RN and Post -op Vital signs reviewed and stable  Post vital signs: Reviewed and stable  Complications: No apparent anesthesia complications  Last Vitals:  Vitals Value Taken Time  BP 142/86 01/20/22 0917  Temp    Pulse 95 01/20/22 0925  Resp 17 01/20/22 0925  SpO2 100 % 01/20/22 0925  Vitals shown include unvalidated device data.  Last Pain:  Vitals:   01/20/22 0701  TempSrc: Oral  PainSc: 0-No pain      Patients Stated Pain Goal: 3 (01/20/22 0701)  Complications: No notable events documented.

## 2022-01-20 NOTE — Discharge Instructions (Addendum)
  Post Anesthesia Home Care Instructions  Activity: Get plenty of rest for the remainder of the day. A responsible adult should stay with you for 24 hours following the procedure.  For the next 24 hours, DO NOT: -Drive a car -Advertising copywriter -Drink alcoholic beverages -Take any medication unless instructed by your physician -Make any legal decisions or sign important papers.  Meals: Start with liquid foods such as gelatin or soup. Progress to regular foods as tolerated. Avoid greasy, spicy, heavy foods. If nausea and/or vomiting occur, drink only clear liquids until the nausea and/or vomiting subsides. Call your physician if vomiting continues.  Special Instructions/Symptoms: Your throat may feel dry or sore from the anesthesia or the breathing tube placed in your throat during surgery. If this causes discomfort, gargle with warm salt water. The discomfort should disappear within 24 hours. .    Diet: As you were doing prior to hospitalization   Activity: Increase activity slowly as tolerated  No lifting or driving for 48 hours Recommend early motion with the shoulder, arm above head x20,min every hour for first 24hrs while awake, then to start PT as scheduled.  Weight Bearing: weight bearing as tolerated.  To prevent constipation: you may use a stool softener such as -  Colace ( over the counter) 100 mg by mouth twice a day  Drink plenty of fluids ( prune juice may be helpful) and high fiber foods  Miralax ( over the counter) for constipation as needed.   Precautions: If you experience chest pain or shortness of breath - call 911 immediately For transfer to the hospital emergency department!!  If you develop a fever greater that 101 F, purulent drainage from wound, increased redness or drainage from wound, or calf pain -- Call the office   Follow- Up Appointment: Please call for an appointment to be seen in 2 weeks or as scheduled [facility] - [phone]

## 2022-01-20 NOTE — Interval H&P Note (Signed)
History and Physical Interval Note:  01/20/2022 7:06 AM  Brandon [name]  has presented today for surgery, with the diagnosis of LEFT SHOULDER ADHESIVE CAPSULITIS.  The various methods of treatment have been discussed with the patient and family. After consideration of risks, benefits and other options for treatment, the patient has consented to  Procedure(s): CLOSED MANIPULATION SHOULDER (Left) as a surgical intervention.  The patient's history has been reviewed, patient examined, no change in status, stable for surgery.  I have reviewed the patient's chart and labs.  Questions were answered to the patient's satisfaction.     Sheral Apley

## 2022-01-20 NOTE — Op Note (Signed)
01/20/2022  10:05 AM  PATIENT:  Brandon [name]    PRE-OPERATIVE DIAGNOSIS:  LEFT SHOULDER ADHESIVE CAPSULITIS  POST-OPERATIVE DIAGNOSIS:  Same  PROCEDURE:  CLOSED MANIPULATION SHOULDER  SURGEON:  Sheral Apley, MD  ASSISTANT: Levester Fresh, PA-C, he was present and scrubbed throughout the case, critical for completion in a timely fashion, and for retraction, instrumentation, and closure.   ANESTHESIA:   LMA  PREOPERATIVE INDICATIONS:  Brandon [name] is a  51 y.o. male with a diagnosis of LEFT SHOULDER ADHESIVE CAPSULITIS who failed conservative measures and elected for surgical management.    The risks benefits and alternatives were discussed with the patient preoperatively including but not limited to the risks of infection, bleeding, nerve injury, cardiopulmonary complications, the need for revision surgery, among others, and the patient was willing to proceed.  OPERATIVE IMPLANTS: none  OPERATIVE FINDINGS: improved ROM to 170 of FE and 90 of internal/external rotation  BLOOD LOSS: na  COMPLICATIONS: none  TOURNIQUET TIME: na  OPERATIVE PROCEDURE:  Patient was identified in the preoperative holding area and site was marked by me He was transported to the operating theater and placed on the table in supine position taking care to pad all bony prominences. After a preincinduction time out anesthesia was induced.   I used sterile technique to inject 80mg  depo and marcaine into the L subacromial space.   I then performed a closed manipulation and was happy with the scar tissue release and improved motion noted above.   POST OPERATIVE PLAN: mobilize for dvt px. PT

## 2022-01-21 ENCOUNTER — Ambulatory Visit (INDEPENDENT_AMBULATORY_CARE_PROVIDER_SITE_OTHER): Payer: 59 | Admitting: Family Medicine

## 2022-01-21 ENCOUNTER — Other Ambulatory Visit (INDEPENDENT_AMBULATORY_CARE_PROVIDER_SITE_OTHER): Payer: Self-pay | Admitting: Family Medicine

## 2022-01-21 ENCOUNTER — Encounter (HOSPITAL_BASED_OUTPATIENT_CLINIC_OR_DEPARTMENT_OTHER): Payer: Self-pay | Admitting: Orthopedic Surgery

## 2022-01-21 VITALS — BP 112/64 | HR 68 | Temp 98.0°F | Ht 68.0 in | Wt 277.0 lb

## 2022-01-21 DIAGNOSIS — E119 Type 2 diabetes mellitus without complications: Secondary | ICD-10-CM

## 2022-01-21 DIAGNOSIS — E669 Obesity, unspecified: Secondary | ICD-10-CM

## 2022-01-21 DIAGNOSIS — Z7985 Long-term (current) use of injectable non-insulin antidiabetic drugs: Secondary | ICD-10-CM

## 2022-01-21 DIAGNOSIS — Z6841 Body Mass Index (BMI) 40.0 and over, adult: Secondary | ICD-10-CM | POA: Diagnosis not present

## 2022-01-21 DIAGNOSIS — E7849 Other hyperlipidemia: Secondary | ICD-10-CM | POA: Diagnosis not present

## 2022-01-21 DIAGNOSIS — E559 Vitamin D deficiency, unspecified: Secondary | ICD-10-CM | POA: Diagnosis not present

## 2022-01-21 MED ORDER — SEMAGLUTIDE (1 MG/DOSE) 4 MG/3ML ~~LOC~~ SOPN: 1.0000 mg | PEN_INJECTOR | SUBCUTANEOUS | 0 refills | Status: DC

## 2022-01-22 LAB — CMP14+EGFR
ALT: 43 IU/L (ref 0–44)
AST: 20 IU/L (ref 0–40)
Albumin/Globulin Ratio: 2 (ref 1.2–2.2)
Albumin: 4.7 g/dL (ref 4.1–5.1)
Alkaline Phosphatase: 61 IU/L (ref 44–121)
BUN/Creatinine Ratio: 15 (ref 9–20)
BUN: 16 mg/dL (ref 6–24)
Bilirubin Total: 0.4 mg/dL (ref 0.0–1.2)
CO2: 24 mmol/L (ref 20–29)
Calcium: 9.9 mg/dL (ref 8.7–10.2)
Chloride: 94 mmol/L — ABNORMAL LOW (ref 96–106)
Creatinine, Ser: 1.07 mg/dL (ref 0.76–1.27)
Globulin, Total: 2.4 g/dL (ref 1.5–4.5)
Glucose: 120 mg/dL — ABNORMAL HIGH (ref 70–99)
Potassium: 3.2 mmol/L — ABNORMAL LOW (ref 3.5–5.2)
Sodium: 139 mmol/L (ref 134–144)
Total Protein: 7.1 g/dL (ref 6.0–8.5)
eGFR: 85 mL/min/{1.73_m2} (ref >59)

## 2022-01-22 LAB — CBC WITH DIFFERENTIAL/PLATELET
Basophils Absolute: 0 10*3/uL (ref 0.0–0.2)
Basos: 0 %
EOS (ABSOLUTE): 0 10*3/uL (ref 0.0–0.4)
Eos: 0 %
Hematocrit: 46.5 % (ref 37.5–51.0)
Hemoglobin: 15.2 g/dL (ref 13.0–17.7)
Immature Grans (Abs): 0 10*3/uL (ref 0.0–0.1)
Immature Granulocytes: 0 %
Lymphocytes Absolute: 1.7 10*3/uL (ref 0.7–3.1)
Lymphs: 18 %
MCH: 26 pg — ABNORMAL LOW (ref 26.6–33.0)
MCHC: 32.7 g/dL (ref 31.5–35.7)
MCV: 80 fL (ref 79–97)
Monocytes Absolute: 0.6 10*3/uL (ref 0.1–0.9)
Monocytes: 6 %
Neutrophils Absolute: 7.1 10*3/uL — ABNORMAL HIGH (ref 1.4–7.0)
Neutrophils: 76 %
Platelets: 189 10*3/uL (ref 150–450)
RBC: 5.84 x10E6/uL — ABNORMAL HIGH (ref 4.14–5.80)
RDW: 16.1 % — ABNORMAL HIGH (ref 11.6–15.4)
WBC: 9.5 10*3/uL (ref 3.4–10.8)

## 2022-01-22 LAB — INSULIN, RANDOM: INSULIN: 143 u[IU]/mL — ABNORMAL HIGH (ref 2.6–24.9)

## 2022-01-22 LAB — HEMOGLOBIN A1C
Est. average glucose Bld gHb Est-mCnc: 120 mg/dL
Hgb A1c MFr Bld: 5.8 % — ABNORMAL HIGH (ref 4.8–5.6)

## 2022-01-22 LAB — LIPID PANEL WITH LDL/HDL RATIO
Cholesterol, Total: 99 mg/dL — ABNORMAL LOW (ref 100–199)
HDL: 36 mg/dL — ABNORMAL LOW (ref >39)
LDL Chol Calc (NIH): 34 mg/dL (ref 0–99)
LDL/HDL Ratio: 0.9 ratio (ref 0.0–3.6)
Triglycerides: 176 mg/dL — ABNORMAL HIGH (ref 0–149)
VLDL Cholesterol Cal: 29 mg/dL (ref 5–40)

## 2022-01-22 LAB — MICROALBUMIN / CREATININE URINE RATIO
Creatinine, Urine: 166.2 mg/dL
Microalb/Creat Ratio: 11 mg/g creat (ref 0–29)
Microalbumin, Urine: 17.5 ug/mL

## 2022-01-22 LAB — VITAMIN D 25 HYDROXY (VIT D DEFICIENCY, FRACTURES): Vit D, 25-Hydroxy: 68.1 ng/mL (ref 30.0–100.0)

## 2022-01-22 LAB — TSH: TSH: 0.636 u[IU]/mL (ref 0.450–4.500)

## 2022-01-22 LAB — VITAMIN B12: Vitamin B-12: 290 pg/mL (ref 232–1245)

## 2022-01-27 NOTE — Progress Notes (Signed)
Chief Complaint:   OBESITY Brandon [name] is here to discuss his progress with his obesity treatment plan along with follow-up of his obesity related diagnoses. Brandon [name] is on the Category 4 Plan and states he is following his eating plan approximately 60% of the time. Brandon [name] states he is walking for 10 minutes 3 times per week.  Today's visit was #: 31 Starting weight: 305 lbs Starting date: 09/15/2017 Today's weight: 277 lbs Today's date: 01/21/2022 Total lbs lost to date: 28 Total lbs lost since last in-office visit: 2  Interim History: Brandon [name] continues to work on weight loss.  He did some celebration eating, but he is working on being mindful of his food choices.  He is working on shoulder physical therapy in addition to walking for exercise.  Subjective:   1. Type 2 diabetes mellitus without complication, unspecified whether long term insulin use (HCC) Brandon [name] is on Ozempic with no nausea or vomiting.  He is working on his diet and he is due for labs.  2. Other hyperlipidemia Brandon [name] is working on decreasing cholesterol in his diet.  No side effects were noted, and he is due for labs.  3. Vitamin D deficiency Brandon [name] is on vitamin D weekly, and he is due to have labs rechecked.  Assessment/Plan:   1. Type 2 diabetes mellitus without complication, unspecified whether long term insulin use (HCC) We will check labs today.  Brandon [name] agreed to increase Ozempic to 1 mg once weekly, and we will refill for 1 month.  - Vitamin B12 - CMP14+EGFR - CBC with Differential/Platelet - Insulin, random - Hemoglobin A1c - Microalbumin / creatinine urine ratio - Semaglutide, 1 MG/DOSE, 4 MG/3ML SOPN; Inject 1 mg as directed once a week.  Dispense: 3 mL; Refill: 0  2. Other hyperlipidemia We will check labs today.  Brandon [name] will continue to work on diet, exercise and weight loss efforts. Orders and follow up as documented in patient record.   - Lipid Panel With LDL/HDL Ratio - TSH  3.  Vitamin D deficiency We will check labs today, and we will refill prescription Vitamin D 50,000 IU every week #4 for 1 month. Brandon [name] will follow-up for routine testing of Vitamin D, at least 2-3 times per year to avoid over-replacement.  - VITAMIN D 25 Hydroxy (Vit-D Deficiency, Fractures)  4. Obesity, Current BMI 42.2 Brandon [name] is currently in the action stage of change. As such, his goal is to continue with weight loss efforts. He has agreed to the Category 4 Plan.   Exercise goals: As is.   Behavioral modification strategies: increasing lean protein intake.  Brandon [name] has agreed to follow-up with our clinic in 4 weeks. He was informed of the importance of frequent follow-up visits to maximize his success with intensive lifestyle modifications for his multiple health conditions.   Brandon [name] was informed we would discuss his lab results at his next visit unless there is a critical issue that needs to be addressed sooner. Brandon [name] agreed to keep his next visit at the agreed upon time to discuss these results.  Objective:   Blood pressure 112/64, pulse 68, temperature 98 F (36.7 C), height 5' 8"  (1.727 m), weight 277 lb (125.6 kg), SpO2 99 %. Body mass index is 42.12 kg/m.  General: Cooperative, alert, well developed, in no acute distress. HEENT: Conjunctivae and lids unremarkable. Cardiovascular: Regular rhythm.  Lungs: Normal work of breathing. Neurologic: No focal deficits.   Lab Results  Component Value Date   CREATININE 1.07 01/21/2022  BUN 16 01/21/2022   NA 139 01/21/2022   K 3.2 (L) 01/21/2022   CL 94 (L) 01/21/2022   CO2 24 01/21/2022   Lab Results  Component Value Date   ALT 43 01/21/2022   AST 20 01/21/2022   ALKPHOS 61 01/21/2022   BILITOT 0.4 01/21/2022   Lab Results  Component Value Date   HGBA1C 5.8 (H) 01/21/2022   HGBA1C 6.7 (H) 08/12/2021   HGBA1C 7.7 (H) 06/04/2021   HGBA1C 7.6 (H) 12/25/2020   HGBA1C 6.7 (H) 08/06/2020   Lab Results  Component  Value Date   INSULIN 143.0 (H) 01/21/2022   INSULIN 266.0 (H) 08/12/2021   INSULIN 126.0 (H) 06/04/2021   INSULIN 185.0 (H) 08/06/2020   INSULIN 83.4 (H) 01/03/2020   Lab Results  Component Value Date   TSH 0.636 01/21/2022   Lab Results  Component Value Date   CHOL 99 (L) 01/21/2022   HDL 36 (L) 01/21/2022   LDLCALC 34 01/21/2022   TRIG 176 (H) 01/21/2022   CHOLHDL 3.4 08/24/2017   Lab Results  Component Value Date   VD25OH 68.1 01/21/2022   VD25OH 42.8 08/12/2021   VD25OH 42.6 06/04/2021   Lab Results  Component Value Date   WBC 9.5 01/21/2022   HGB 15.2 01/21/2022   HCT 46.5 01/21/2022   MCV 80 01/21/2022   PLT 189 01/21/2022   No results found for: "IRON", "TIBC", "FERRITIN"  Attestation Statements:   Reviewed by clinician on day of visit: allergies, medications, problem list, medical history, surgical history, family history, social history, and previous encounter notes.   I, Trixie Dredge, am acting as transcriptionist for Dennard Nip, MD.  I have reviewed the above documentation for accuracy and completeness, and I agree with the above. -  Dennard Nip, MD

## 2022-02-11 ENCOUNTER — Encounter (INDEPENDENT_AMBULATORY_CARE_PROVIDER_SITE_OTHER): Payer: Self-pay

## 2022-02-18 ENCOUNTER — Ambulatory Visit (INDEPENDENT_AMBULATORY_CARE_PROVIDER_SITE_OTHER): Payer: 59 | Admitting: Family Medicine

## 2022-02-18 ENCOUNTER — Encounter (INDEPENDENT_AMBULATORY_CARE_PROVIDER_SITE_OTHER): Payer: Self-pay | Admitting: Family Medicine

## 2022-02-18 VITALS — BP 103/65 | HR 91 | Temp 97.2°F | Ht 68.0 in | Wt 273.0 lb

## 2022-02-18 DIAGNOSIS — E119 Type 2 diabetes mellitus without complications: Secondary | ICD-10-CM

## 2022-02-18 DIAGNOSIS — E669 Obesity, unspecified: Secondary | ICD-10-CM

## 2022-02-18 DIAGNOSIS — Z6841 Body Mass Index (BMI) 40.0 and over, adult: Secondary | ICD-10-CM

## 2022-02-18 DIAGNOSIS — Z7985 Long-term (current) use of injectable non-insulin antidiabetic drugs: Secondary | ICD-10-CM

## 2022-02-18 DIAGNOSIS — E559 Vitamin D deficiency, unspecified: Secondary | ICD-10-CM

## 2022-02-18 MED ORDER — SEMAGLUTIDE (1 MG/DOSE) 4 MG/3ML ~~LOC~~ SOPN: 1.0000 mg | PEN_INJECTOR | SUBCUTANEOUS | 0 refills | Status: DC

## 2022-02-18 MED ORDER — VITAMIN D (ERGOCALCIFEROL) 1.25 MG (50000 UNIT) PO CAPS: 50000.0000 [IU] | ORAL_CAPSULE | ORAL | 0 refills | Status: DC

## 2022-02-25 NOTE — Progress Notes (Signed)
Chief Complaint:   OBESITY Brandon [name] is here to discuss his progress with his obesity treatment plan along with follow-up of his obesity related diagnoses. Brandon [name] is on the Category 4 Plan and states he is following his eating plan approximately 40% of the time. Damein states he is walking for 15 minutes 2 times per week.  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 His hunger is controlled and he is working on portion control and Contractor.   Subjective:   1. Type 2 diabetes mellitus without complication, unspecified whether long term insulin use (HCC) Brandon [name] increased Ozempic to 1 mg and he is doing well overall with minimal GI issues.   2. Vitamin D deficiency Brandon [name] is stable on Vitamin D, and he denies nausea, vomiting, or muscle weakness.   Assessment/Plan:   1. Type 2 diabetes mellitus without complication, unspecified whether long term insulin use (HCC) Travus will continue Ozempic 1 mg once weekly, and we will refill for 1 month.  - Semaglutide, 1 MG/DOSE, 4 MG/3ML SOPN; Inject 1 mg as directed once a week.  Dispense: 3 mL; Refill: 0  2. Vitamin D deficiency Brandon [name] agreed to change prescription Vitamin D to 50,000 IU 1 capsule PO once weekly, with no refills.   - Vitamin D, Ergocalciferol, (DRISDOL) 1.25 MG (50000 UNIT) CAPS capsule; Take 1 capsule (50,000 Units total) by mouth every 7 (seven) days.  Dispense: 5 capsule; Refill: 0  3. Obesity, Current BMI 41.6 Brandon [name] is currently in the action stage of change. As such, his goal is to continue with weight loss efforts. He has agreed to the Category 4 Plan.   Exercise goals: As is.   Behavioral modification strategies: increasing lean protein intake.  Brandon [name] has agreed to  follow-up with our clinic in 3 to 4 weeks. He was informed of the importance of frequent follow-up visits to maximize his success with intensive lifestyle modifications for his multiple health conditions.   Objective:   Blood pressure 103/65, pulse 91, temperature (!) 97.2 F (36.2 C), height 5\' 8"  (1.727 m), weight 273 lb (123.8 kg), SpO2 98 %. Body mass index is 41.51 kg/m.  General: Cooperative, alert, well developed, in no acute distress. HEENT: Conjunctivae and lids unremarkable. Cardiovascular: Regular rhythm.  Lungs: Normal work of breathing. Neurologic: No focal deficits.   Lab Results  Component Value Date   CREATININE 1.07 01/21/2022   BUN 16 01/21/2022   NA 139 01/21/2022   K 3.2 (L) 01/21/2022   CL 94 (L) 01/21/2022   CO2 24 01/21/2022   Lab Results  Component Value Date   ALT 43 01/21/2022   AST 20 01/21/2022   ALKPHOS 61 01/21/2022   BILITOT 0.4 01/21/2022   Lab Results  Component Value Date   HGBA1C 5.8 (H) 01/21/2022   HGBA1C 6.7 (H) 08/12/2021   HGBA1C 7.7 (H) 06/04/2021   HGBA1C 7.6 (H) 12/25/2020   HGBA1C 6.7 (H) 08/06/2020   Lab Results  Component Value Date   INSULIN 143.0 (H) 01/21/2022   INSULIN 266.0 (H) 08/12/2021   INSULIN 126.0 (H) 06/04/2021   INSULIN 185.0 (H) 08/06/2020   INSULIN 83.4 (H) 01/03/2020   Lab Results  Component Value Date   TSH 0.636 01/21/2022   Lab Results  Component Value  Date   CHOL 99 (L) 01/21/2022   HDL 36 (L) 01/21/2022   LDLCALC 34 01/21/2022   TRIG 176 (H) 01/21/2022   CHOLHDL 3.4 08/24/2017   Lab Results  Component Value Date   VD25OH 68.1 01/21/2022   VD25OH 42.8 08/12/2021   VD25OH 42.6 06/04/2021   Lab Results  Component Value Date   WBC 9.5 01/21/2022   HGB 15.2 01/21/2022   HCT 46.5 01/21/2022   MCV 80 01/21/2022   PLT 189 01/21/2022   No results found for: "IRON", "TIBC", "FERRITIN"  Attestation Statements:   Reviewed by clinician on day of visit: allergies, medications, problem  list, medical history, surgical history, family history, social history, and previous encounter notes.   I, Burt Knack, am acting as transcriptionist for Quillian Quince, MD.  I have reviewed the above documentation for accuracy and completeness, and I agree with the above. -  Quillian Quince, MD

## 2022-03-19 ENCOUNTER — Ambulatory Visit (INDEPENDENT_AMBULATORY_CARE_PROVIDER_SITE_OTHER): Payer: 59 | Admitting: Family Medicine

## 2022-03-19 ENCOUNTER — Encounter (INDEPENDENT_AMBULATORY_CARE_PROVIDER_SITE_OTHER): Payer: Self-pay | Admitting: Family Medicine

## 2022-03-19 VITALS — BP 115/69 | HR 95 | Temp 98.4°F | Ht 68.0 in | Wt 273.0 lb

## 2022-03-19 DIAGNOSIS — I10 Essential (primary) hypertension: Secondary | ICD-10-CM | POA: Diagnosis not present

## 2022-03-19 DIAGNOSIS — Z6841 Body Mass Index (BMI) 40.0 and over, adult: Secondary | ICD-10-CM | POA: Diagnosis not present

## 2022-03-19 DIAGNOSIS — E669 Obesity, unspecified: Secondary | ICD-10-CM

## 2022-03-19 DIAGNOSIS — Z7985 Long-term (current) use of injectable non-insulin antidiabetic drugs: Secondary | ICD-10-CM | POA: Diagnosis not present

## 2022-03-19 DIAGNOSIS — E119 Type 2 diabetes mellitus without complications: Secondary | ICD-10-CM | POA: Diagnosis not present

## 2022-03-19 DIAGNOSIS — E559 Vitamin D deficiency, unspecified: Secondary | ICD-10-CM | POA: Diagnosis not present

## 2022-03-19 MED ORDER — SEMAGLUTIDE (1 MG/DOSE) 4 MG/3ML ~~LOC~~ SOPN
1.0000 mg | PEN_INJECTOR | SUBCUTANEOUS | 0 refills | Status: DC
Start: 2022-03-19 — End: 2022-04-28

## 2022-03-19 MED ORDER — VITAMIN D (ERGOCALCIFEROL) 1.25 MG (50000 UNIT) PO CAPS: 50000.0000 [IU] | ORAL_CAPSULE | ORAL | 0 refills | Status: DC

## 2022-03-23 NOTE — Progress Notes (Unsigned)
Chief Complaint:   OBESITY Brandon [name] is here to discuss his progress with his obesity treatment plan along with follow-up of his obesity related diagnoses. Brandon [name] is on {MWMwtlossportion/plan2:23431} and states he is following his eating plan approximately ***% of the time. Brandon [name] states he is *** *** minutes *** times per week.  Today's visit was #: *** Starting weight: *** Starting date: *** Today's weight: *** Today's date: 03/19/2022 Total lbs lost to date: *** Total lbs lost since last in-office visit: ***  Interim History: ***  Subjective:   1. Type 2 diabetes mellitus without complication, unspecified whether long term insulin use (HCC) ***  2. Vitamin D deficiency ***  3. Essential hypertension ***  Assessment/Plan:   1. Type 2 diabetes mellitus without complication, unspecified whether long term insulin use (HCC) *** - Semaglutide, 1 MG/DOSE, 4 MG/3ML SOPN; Inject 1 mg as directed once a week.  Dispense: 3 mL; Refill: 0  2. Vitamin D deficiency *** - Vitamin D, Ergocalciferol, (DRISDOL) 1.25 MG (50000 UNIT) CAPS capsule; Take 1 capsule (50,000 Units total) by mouth every 7 (seven) days.  Dispense: 5 capsule; Refill: 0  3. Essential hypertension ***  4. Obesity, Current BMI 41.6 Brandon [name] is currently in the action stage of change. As such, his goal is to continue with weight loss efforts. He has agreed to the Category 4 Plan.   Exercise goals: As is.  Behavioral modification strategies: increasing lean protein intake.  Brandon [name] has agreed to follow-up with our clinic in 4 weeks. He was informed of the importance of frequent follow-up visits to maximize his success with intensive lifestyle modifications for his multiple health conditions.   Objective:   Blood pressure 115/69, pulse 95, temperature 98.4 F (36.9 C), height 5\' 8"  (1.727 m), weight 273 lb (123.8 kg), SpO2 96 %. Body mass index is 41.51 kg/m.  General: Cooperative, alert, well developed,  in no acute distress. HEENT: Conjunctivae and lids unremarkable. Cardiovascular: Regular rhythm.  Lungs: Normal work of breathing. Neurologic: No focal deficits.   Lab Results  Component Value Date   CREATININE 1.07 01/21/2022   BUN 16 01/21/2022   NA 139 01/21/2022   K 3.2 (L) 01/21/2022   CL 94 (L) 01/21/2022   CO2 24 01/21/2022   Lab Results  Component Value Date   ALT 43 01/21/2022   AST 20 01/21/2022   ALKPHOS 61 01/21/2022   BILITOT 0.4 01/21/2022   Lab Results  Component Value Date   HGBA1C 5.8 (H) 01/21/2022   HGBA1C 6.7 (H) 08/12/2021   HGBA1C 7.7 (H) 06/04/2021   HGBA1C 7.6 (H) 12/25/2020   HGBA1C 6.7 (H) 08/06/2020   Lab Results  Component Value Date   INSULIN 143.0 (H) 01/21/2022   INSULIN 266.0 (H) 08/12/2021   INSULIN 126.0 (H) 06/04/2021   INSULIN 185.0 (H) 08/06/2020   INSULIN 83.4 (H) 01/03/2020   Lab Results  Component Value Date   TSH 0.636 01/21/2022   Lab Results  Component Value Date   CHOL 99 (L) 01/21/2022   HDL 36 (L) 01/21/2022   LDLCALC 34 01/21/2022   TRIG 176 (H) 01/21/2022   CHOLHDL 3.4 08/24/2017   Lab Results  Component Value Date   VD25OH 68.1 01/21/2022   VD25OH 42.8 08/12/2021   VD25OH 42.6 06/04/2021   Lab Results  Component Value Date   WBC 9.5 01/21/2022   HGB 15.2 01/21/2022   HCT 46.5 01/21/2022   MCV 80 01/21/2022   PLT 189 01/21/2022   No  results found for: "IRON", "TIBC", "FERRITIN"  Attestation Statements:   Reviewed by clinician on day of visit: allergies, medications, problem list, medical history, surgical history, family history, social history, and previous encounter notes.   I, Trixie Dredge, am acting as transcriptionist for Dennard Nip, MD.  I have reviewed the above documentation for accuracy and completeness, and I agree with the above. -  ***

## 2022-03-25 ENCOUNTER — Other Ambulatory Visit (INDEPENDENT_AMBULATORY_CARE_PROVIDER_SITE_OTHER): Payer: Self-pay | Admitting: Family Medicine

## 2022-03-25 DIAGNOSIS — E559 Vitamin D deficiency, unspecified: Secondary | ICD-10-CM

## 2022-04-13 ENCOUNTER — Encounter (INDEPENDENT_AMBULATORY_CARE_PROVIDER_SITE_OTHER): Payer: Self-pay | Admitting: Family Medicine

## 2022-04-13 ENCOUNTER — Other Ambulatory Visit (INDEPENDENT_AMBULATORY_CARE_PROVIDER_SITE_OTHER): Payer: Self-pay | Admitting: Family Medicine

## 2022-04-13 DIAGNOSIS — E119 Type 2 diabetes mellitus without complications: Secondary | ICD-10-CM

## 2022-04-14 ENCOUNTER — Other Ambulatory Visit (INDEPENDENT_AMBULATORY_CARE_PROVIDER_SITE_OTHER): Payer: Self-pay | Admitting: Family Medicine

## 2022-04-14 DIAGNOSIS — E119 Type 2 diabetes mellitus without complications: Secondary | ICD-10-CM

## 2022-04-28 ENCOUNTER — Other Ambulatory Visit (HOSPITAL_COMMUNITY): Payer: Self-pay

## 2022-04-28 ENCOUNTER — Other Ambulatory Visit (INDEPENDENT_AMBULATORY_CARE_PROVIDER_SITE_OTHER): Payer: Self-pay | Admitting: Family Medicine

## 2022-04-28 DIAGNOSIS — E119 Type 2 diabetes mellitus without complications: Secondary | ICD-10-CM

## 2022-04-28 MED ORDER — SEMAGLUTIDE (1 MG/DOSE) 4 MG/3ML ~~LOC~~ SOPN
1.0000 mg | PEN_INJECTOR | SUBCUTANEOUS | 0 refills | Status: DC
  Filled 2022-04-28: qty 3, 28d supply, fill #0

## 2022-04-29 ENCOUNTER — Other Ambulatory Visit (HOSPITAL_COMMUNITY): Payer: Self-pay

## 2022-04-30 ENCOUNTER — Ambulatory Visit (INDEPENDENT_AMBULATORY_CARE_PROVIDER_SITE_OTHER): Payer: 59 | Admitting: Family Medicine

## 2022-04-30 ENCOUNTER — Encounter (INDEPENDENT_AMBULATORY_CARE_PROVIDER_SITE_OTHER): Payer: Self-pay | Admitting: Family Medicine

## 2022-04-30 ENCOUNTER — Other Ambulatory Visit (HOSPITAL_COMMUNITY): Payer: Self-pay

## 2022-04-30 VITALS — BP 104/67 | HR 77 | Temp 98.4°F | Ht 68.0 in | Wt 270.0 lb

## 2022-04-30 DIAGNOSIS — Z6841 Body Mass Index (BMI) 40.0 and over, adult: Secondary | ICD-10-CM

## 2022-04-30 DIAGNOSIS — E119 Type 2 diabetes mellitus without complications: Secondary | ICD-10-CM

## 2022-04-30 DIAGNOSIS — E669 Obesity, unspecified: Secondary | ICD-10-CM

## 2022-04-30 DIAGNOSIS — Z7985 Long-term (current) use of injectable non-insulin antidiabetic drugs: Secondary | ICD-10-CM

## 2022-04-30 DIAGNOSIS — E559 Vitamin D deficiency, unspecified: Secondary | ICD-10-CM

## 2022-04-30 DIAGNOSIS — E782 Mixed hyperlipidemia: Secondary | ICD-10-CM

## 2022-04-30 MED ORDER — VITAMIN D (ERGOCALCIFEROL) 1.25 MG (50000 UNIT) PO CAPS
50000.0000 [IU] | ORAL_CAPSULE | ORAL | 0 refills | Status: DC
  Filled 2022-04-30: qty 4, 28d supply, fill #0

## 2022-04-30 MED ORDER — SEMAGLUTIDE (1 MG/DOSE) 4 MG/3ML ~~LOC~~ SOPN: 1.0000 mg | PEN_INJECTOR | SUBCUTANEOUS | 0 refills | Status: DC

## 2022-05-01 ENCOUNTER — Other Ambulatory Visit (HOSPITAL_COMMUNITY): Payer: Self-pay

## 2022-05-02 LAB — MICROALBUMIN / CREATININE URINE RATIO
Creatinine, Urine: 119.6 mg/dL
Microalb/Creat Ratio: 11 mg/g creat (ref 0–29)
Microalbumin, Urine: 12.9 ug/mL

## 2022-05-02 LAB — HEMOGLOBIN A1C
Est. average glucose Bld gHb Est-mCnc: 111 mg/dL
Hgb A1c MFr Bld: 5.5 % (ref 4.8–5.6)

## 2022-05-02 LAB — CMP14+EGFR
ALT: 54 IU/L — ABNORMAL HIGH (ref 0–44)
AST: 26 IU/L (ref 0–40)
Albumin/Globulin Ratio: 2 (ref 1.2–2.2)
Albumin: 4.8 g/dL (ref 4.1–5.1)
Alkaline Phosphatase: 66 IU/L (ref 44–121)
BUN/Creatinine Ratio: 19 (ref 9–20)
BUN: 20 mg/dL (ref 6–24)
Bilirubin Total: 0.6 mg/dL (ref 0.0–1.2)
CO2: 27 mmol/L (ref 20–29)
Calcium: 9.9 mg/dL (ref 8.7–10.2)
Chloride: 97 mmol/L (ref 96–106)
Creatinine, Ser: 1.07 mg/dL (ref 0.76–1.27)
Globulin, Total: 2.4 g/dL (ref 1.5–4.5)
Glucose: 95 mg/dL (ref 70–99)
Potassium: 3.3 mmol/L — ABNORMAL LOW (ref 3.5–5.2)
Sodium: 141 mmol/L (ref 134–144)
Total Protein: 7.2 g/dL (ref 6.0–8.5)
eGFR: 85 mL/min/{1.73_m2} (ref >59)

## 2022-05-02 LAB — LIPID PANEL WITH LDL/HDL RATIO
Cholesterol, Total: 100 mg/dL (ref 100–199)
HDL: 27 mg/dL — ABNORMAL LOW (ref >39)
LDL Chol Calc (NIH): 21 mg/dL (ref 0–99)
LDL/HDL Ratio: 0.8 ratio (ref 0.0–3.6)
Triglycerides: 370 mg/dL — ABNORMAL HIGH (ref 0–149)
VLDL Cholesterol Cal: 52 mg/dL — ABNORMAL HIGH (ref 5–40)

## 2022-05-02 LAB — CBC WITH DIFFERENTIAL/PLATELET
Basophils Absolute: 0 10*3/uL (ref 0.0–0.2)
Basos: 1 %
EOS (ABSOLUTE): 0.1 10*3/uL (ref 0.0–0.4)
Eos: 2 %
Hematocrit: 48.6 % (ref 37.5–51.0)
Hemoglobin: 16.2 g/dL (ref 13.0–17.7)
Immature Grans (Abs): 0 10*3/uL (ref 0.0–0.1)
Immature Granulocytes: 0 %
Lymphocytes Absolute: 1.8 10*3/uL (ref 0.7–3.1)
Lymphs: 27 %
MCH: 28 pg (ref 26.6–33.0)
MCHC: 33.3 g/dL (ref 31.5–35.7)
MCV: 84 fL (ref 79–97)
Monocytes Absolute: 0.5 10*3/uL (ref 0.1–0.9)
Monocytes: 7 %
Neutrophils Absolute: 4.2 10*3/uL (ref 1.4–7.0)
Neutrophils: 63 %
Platelets: 162 10*3/uL (ref 150–450)
RBC: 5.79 x10E6/uL (ref 4.14–5.80)
RDW: 14.1 % (ref 11.6–15.4)
WBC: 6.7 10*3/uL (ref 3.4–10.8)

## 2022-05-02 LAB — INSULIN, RANDOM: INSULIN: 46.8 u[IU]/mL — ABNORMAL HIGH (ref 2.6–24.9)

## 2022-05-02 LAB — VITAMIN B12: Vitamin B-12: 311 pg/mL (ref 232–1245)

## 2022-05-02 LAB — TSH: TSH: 2.97 u[IU]/mL (ref 0.450–4.500)

## 2022-05-02 LAB — VITAMIN D 25 HYDROXY (VIT D DEFICIENCY, FRACTURES): Vit D, 25-Hydroxy: 53.7 ng/mL (ref 30.0–100.0)

## 2022-05-05 NOTE — Progress Notes (Unsigned)
Chief Complaint:   OBESITY Brandon [name] is here to discuss his progress with his obesity treatment plan along with follow-up of his obesity related diagnoses. Brandon [name] is on the Category 4 Plan and states he is following his eating plan approximately 50% of the time. Brandon [name] states he is walking for 10 minutes 5 times per week.  Today's visit was #: 59 Starting weight: 305 lbs Starting date: 09/15/2017 Today's weight: 270 lbs Today's date: 04/30/2022 Total lbs lost to date: 35 Total lbs lost since last in-office visit: 3  Interim History: Brandon [name] has done well with his weight loss.  He notes some slight increase in nausea with increased dose of GLP-1.  He is getting in his protein but he is struggling to get all of his calories.  Subjective:   1. Type 2 diabetes mellitus without complication, without long-term current use of insulin (HCC) Brandon [name] is taking Ozempic 1 mg once weekly, and he notes some increase in nausea with increased dose.  He is getting in his protein, but he wonders about slightly decreasing his dose.    2. Vitamin D deficiency Brandon [name] is taking vitamin D 50,000 units once weekly with no side effects noted.  3. Mixed hyperlipidemia Brandon [name] is taking WelChol 625 mg 3 tablets daily with no side effects noted, and Lipitor 20 mg at bedtime with no side effects noted.  Assessment/Plan:   1. Type 2 diabetes mellitus without complication, without long-term current use of insulin (HCC) We will check labs today.  We will refill Ozempic 1 mg once weekly for 1 month, and Brandon [name] will try to decrease to 0.75 mg once weekly (58 clicks). He will continue with his eating plan and exercise.  - CMP14+EGFR - CBC with Differential/Platelet - Vitamin B12 - Hemoglobin A1c - Insulin, random - TSH - Microalbumin / creatinine urine ratio - Semaglutide, 1 MG/DOSE, 4 MG/3ML SOPN; Inject 1 mg as directed once a week.  Dispense: 3 mL; Refill: 0  2. Vitamin D deficiency We will check  labs today, and we will refill prescription vitamin D for 1 month.  - Vitamin D, Ergocalciferol, (DRISDOL) 1.25 MG (50000 UNIT) CAPS capsule; Take 1 capsule (50,000 Units total) by mouth every 7 (seven) days.  Dispense: 5 capsule; Refill: 0 - VITAMIN D 25 Hydroxy (Vit-D Deficiency, Fractures)  3. Mixed hyperlipidemia We will check labs today.  Brandon [name] will continue with his eating plan and exercise.  We will follow-up at his next visit.  - Lipid Panel With LDL/HDL Ratio  4. Obesity, Current BMI 41.1 Brandon [name] is currently in the action stage of change. As such, his goal is to continue with weight loss efforts. He has agreed to the Category 4 Plan.   Exercise goals: As is.   Behavioral modification strategies: increasing lean protein intake, decreasing simple carbohydrates, meal planning and cooking strategies, and planning for success.  Brandon [name] has agreed to follow-up with our clinic in 4 weeks. He was informed of the importance of frequent follow-up visits to maximize his success with intensive lifestyle modifications for his multiple health conditions.   Brandon [name] was informed we would discuss his lab results at his next visit unless there is a critical issue that needs to be addressed sooner. Brandon [name] agreed to keep his next visit at the agreed upon time to discuss these results.  Objective:   Blood pressure 104/67, pulse 77, temperature 98.4 F (36.9 C), height _0  (1.727 m), weight 270 lb (122.5 kg), SpO2 98 %. Body mass index  is 41.05 kg/m.  General: Cooperative, alert, well developed, in no acute distress. HEENT: Conjunctivae and lids unremarkable. Cardiovascular: Regular rhythm.  Lungs: Normal work of breathing. Neurologic: No focal deficits.   Lab Results  Component Value Date   CREATININE 1.07 04/30/2022   BUN 20 04/30/2022   NA 141 04/30/2022   K 3.3 (L) 04/30/2022   CL 97 04/30/2022   CO2 27 04/30/2022   Lab Results  Component Value Date   ALT 54 (H) 04/30/2022    AST 26 04/30/2022   ALKPHOS 66 04/30/2022   BILITOT 0.6 04/30/2022   Lab Results  Component Value Date   HGBA1C 5.5 04/30/2022   HGBA1C 5.8 (H) 01/21/2022   HGBA1C 6.7 (H) 08/12/2021   HGBA1C 7.7 (H) 06/04/2021   HGBA1C 7.6 (H) 12/25/2020   Lab Results  Component Value Date   INSULIN 46.8 (H) 04/30/2022   INSULIN 143.0 (H) 01/21/2022   INSULIN 266.0 (H) 08/12/2021   INSULIN 126.0 (H) 06/04/2021   INSULIN 185.0 (H) 08/06/2020   Lab Results  Component Value Date   TSH 2.970 04/30/2022   Lab Results  Component Value Date   CHOL 100 04/30/2022   HDL 27 (L) 04/30/2022   LDLCALC 21 04/30/2022   TRIG 370 (H) 04/30/2022   CHOLHDL 3.4 08/24/2017   Lab Results  Component Value Date   VD25OH 53.7 04/30/2022   VD25OH 68.1 01/21/2022   VD25OH 42.8 08/12/2021   Lab Results  Component Value Date   WBC 6.7 04/30/2022   HGB 16.2 04/30/2022   HCT 48.6 04/30/2022   MCV 84 04/30/2022   PLT 162 04/30/2022   No results found for: "IRON", "TIBC", "FERRITIN"  Attestation Statements:   Reviewed by clinician on day of visit: allergies, medications, problem list, medical history, surgical history, family history, social history, and previous encounter notes.   I, Trixie Dredge, am acting as transcriptionist for Dennard Nip, MD.  I have reviewed the above documentation for accuracy and completeness, and I agree with the above. -  Dennard Nip, MD

## 2022-05-23 ENCOUNTER — Other Ambulatory Visit (INDEPENDENT_AMBULATORY_CARE_PROVIDER_SITE_OTHER): Payer: Self-pay | Admitting: Family Medicine

## 2022-05-23 DIAGNOSIS — E119 Type 2 diabetes mellitus without complications: Secondary | ICD-10-CM

## 2022-05-23 DIAGNOSIS — E559 Vitamin D deficiency, unspecified: Secondary | ICD-10-CM

## 2022-06-01 ENCOUNTER — Encounter (INDEPENDENT_AMBULATORY_CARE_PROVIDER_SITE_OTHER): Payer: Self-pay | Admitting: Family Medicine

## 2022-06-01 ENCOUNTER — Ambulatory Visit (INDEPENDENT_AMBULATORY_CARE_PROVIDER_SITE_OTHER): Payer: 59 | Admitting: Family Medicine

## 2022-06-01 VITALS — BP 121/83 | HR 82 | Temp 98.2°F | Ht 68.0 in | Wt 274.0 lb

## 2022-06-01 DIAGNOSIS — E559 Vitamin D deficiency, unspecified: Secondary | ICD-10-CM | POA: Diagnosis not present

## 2022-06-01 DIAGNOSIS — Z7985 Long-term (current) use of injectable non-insulin antidiabetic drugs: Secondary | ICD-10-CM

## 2022-06-01 DIAGNOSIS — E669 Obesity, unspecified: Secondary | ICD-10-CM

## 2022-06-01 DIAGNOSIS — F439 Reaction to severe stress, unspecified: Secondary | ICD-10-CM

## 2022-06-01 DIAGNOSIS — E119 Type 2 diabetes mellitus without complications: Secondary | ICD-10-CM | POA: Diagnosis not present

## 2022-06-01 DIAGNOSIS — Z6841 Body Mass Index (BMI) 40.0 and over, adult: Secondary | ICD-10-CM

## 2022-06-03 MED ORDER — SEMAGLUTIDE (1 MG/DOSE) 4 MG/3ML ~~LOC~~ SOPN: 1.0000 mg | PEN_INJECTOR | SUBCUTANEOUS | 0 refills | Status: DC

## 2022-06-03 MED ORDER — VITAMIN D (ERGOCALCIFEROL) 1.25 MG (50000 UNIT) PO CAPS: 50000.0000 [IU] | ORAL_CAPSULE | ORAL | 0 refills | Status: DC

## 2022-06-09 NOTE — Progress Notes (Unsigned)
Chief Complaint:   OBESITY Brandon [name] is here to discuss his progress with his obesity treatment plan along with follow-up of his obesity related diagnoses. Brandon [name] is on the Category 4 Plan and states he is following his eating plan approximately 60% of the time. Brandon [name] states he is walking for 10 minutes 3-4 times per week.  Today's visit was #: 62 Starting weight: 305 lbs Starting date: 09/15/2017 Today's weight: 274 lbs Today's date: 06/01/2022 Total lbs lost to date: 31 Total lbs lost since last in-office visit: 0  Interim History: Brandon [name] did some celebration eating over Thanksgiving. He hasn't been able to concentrate on his weight loss as much recently.   Subjective:   1. Type 2 diabetes mellitus without complication, unspecified whether long term insulin use (HCC) Brandon [name]'s last A1c was 5.5 on Ozempic. He increased his dose to 1 mg and he has some mild gassiness.   2. Stress Brandon [name] is dealing with a lot of news related stress and he is doing some stress eating.   3. Vitamin D deficiency Brandon [name] is on Vitamin D, and his last Vitamin D level was at goal.   Assessment/Plan:   1. Type 2 diabetes mellitus without complication, unspecified whether long term insulin use (HCC) Brandon [name] will continue Ozempic at 1 mg once weekly, and we will refill for 1 month.   - Semaglutide, 1 MG/DOSE, 4 MG/3ML SOPN; Inject 1 mg as directed once a week for 28 days.  Dispense: 3 mL; Refill: 0  2. Stress Brandon [name] was offered support and encouragement. He will work on decreasing his stress and comfort eating.   3. Vitamin D deficiency Brandon [name] will continue prescription Vitamin D 50,000 IU every week, and we will refill for 1 month. He will follow-up for routine testing of Vitamin D, at least 2-3 times per year to avoid over-replacement.  - Vitamin D, Ergocalciferol, (DRISDOL) 1.25 MG (50000 UNIT) CAPS capsule; Take 1 capsule (50,000 Units total) by mouth every 7 (seven) days.  Dispense: 5  capsule; Refill: 0  4. Obesity, Current BMI 41.7 Brandon [name] is currently in the action stage of change. As such, his goal is to continue with weight loss efforts. He has agreed to the Category 4 Plan.   Exercise goals: As is.   Behavioral modification strategies: increasing lean protein intake.  Brandon [name] has agreed to follow-up with our clinic in 4 weeks. He was informed of the importance of frequent follow-up visits to maximize his success with intensive lifestyle modifications for his multiple health conditions.   Objective:   Blood pressure 121/83, pulse 82, temperature 98.2 F (36.8 C), height 5\' 8"  (1.727 m), weight 274 lb (124.3 kg), SpO2 95 %. Body mass index is 41.66 kg/m.  General: Cooperative, alert, well developed, in no acute distress. HEENT: Conjunctivae and lids unremarkable. Cardiovascular: Regular rhythm.  Lungs: Normal work of breathing. Neurologic: No focal deficits.   Lab Results  Component Value Date   CREATININE 1.07 04/30/2022   BUN 20 04/30/2022   NA 141 04/30/2022   K 3.3 (L) 04/30/2022   CL 97 04/30/2022   CO2 27 04/30/2022   Lab Results  Component Value Date   ALT 54 (H) 04/30/2022   AST 26 04/30/2022   ALKPHOS 66 04/30/2022   BILITOT 0.6 04/30/2022   Lab Results  Component Value Date   HGBA1C 5.5 04/30/2022   HGBA1C 5.8 (H) 01/21/2022   HGBA1C 6.7 (H) 08/12/2021   HGBA1C 7.7 (H) 06/04/2021   HGBA1C 7.6 (H) 12/25/2020  Lab Results  Component Value Date   INSULIN 46.8 (H) 04/30/2022   INSULIN 143.0 (H) 01/21/2022   INSULIN 266.0 (H) 08/12/2021   INSULIN 126.0 (H) 06/04/2021   INSULIN 185.0 (H) 08/06/2020   Lab Results  Component Value Date   TSH 2.970 04/30/2022   Lab Results  Component Value Date   CHOL 100 04/30/2022   HDL 27 (L) 04/30/2022   LDLCALC 21 04/30/2022   TRIG 370 (H) 04/30/2022   CHOLHDL 3.4 08/24/2017   Lab Results  Component Value Date   VD25OH 53.7 04/30/2022   VD25OH 68.1 01/21/2022   VD25OH 42.8  08/12/2021   Lab Results  Component Value Date   WBC 6.7 04/30/2022   HGB 16.2 04/30/2022   HCT 48.6 04/30/2022   MCV 84 04/30/2022   PLT 162 04/30/2022   No results found for: "IRON", "TIBC", "FERRITIN"  Attestation Statements:   Reviewed by clinician on day of visit: allergies, medications, problem list, medical history, surgical history, family history, social history, and previous encounter notes.   I, Burt Knack, am acting as transcriptionist for Quillian Quince, MD.  I have reviewed the above documentation for accuracy and completeness, and I agree with the above. -  Quillian Quince, MD

## 2022-06-19 ENCOUNTER — Encounter (INDEPENDENT_AMBULATORY_CARE_PROVIDER_SITE_OTHER): Payer: Self-pay | Admitting: Family Medicine

## 2022-06-22 ENCOUNTER — Other Ambulatory Visit (INDEPENDENT_AMBULATORY_CARE_PROVIDER_SITE_OTHER): Payer: Self-pay

## 2022-06-22 ENCOUNTER — Other Ambulatory Visit (HOSPITAL_COMMUNITY): Payer: Self-pay

## 2022-06-22 DIAGNOSIS — E119 Type 2 diabetes mellitus without complications: Secondary | ICD-10-CM

## 2022-06-22 MED ORDER — SEMAGLUTIDE (1 MG/DOSE) 4 MG/3ML ~~LOC~~ SOPN
1.0000 mg | PEN_INJECTOR | SUBCUTANEOUS | 0 refills | Status: DC
  Filled 2022-06-22: qty 3, 28d supply, fill #0

## 2022-06-25 ENCOUNTER — Emergency Department (HOSPITAL_BASED_OUTPATIENT_CLINIC_OR_DEPARTMENT_OTHER)
Admission: EM | Admit: 2022-06-25 | Discharge: 2022-06-25 | Disposition: A | Discharge status: Home / Self Care | Payer: 59 | Attending: Emergency Medicine | Admitting: Emergency Medicine

## 2022-06-25 ENCOUNTER — Other Ambulatory Visit: Payer: Self-pay

## 2022-06-25 ENCOUNTER — Encounter (HOSPITAL_BASED_OUTPATIENT_CLINIC_OR_DEPARTMENT_OTHER): Payer: Self-pay

## 2022-06-25 ENCOUNTER — Emergency Department (HOSPITAL_BASED_OUTPATIENT_CLINIC_OR_DEPARTMENT_OTHER): Payer: 59 | Admitting: Radiology

## 2022-06-25 DIAGNOSIS — E119 Type 2 diabetes mellitus without complications: Secondary | ICD-10-CM | POA: Diagnosis not present

## 2022-06-25 DIAGNOSIS — Z79899 Other long term (current) drug therapy: Secondary | ICD-10-CM | POA: Diagnosis not present

## 2022-06-25 DIAGNOSIS — I10 Essential (primary) hypertension: Secondary | ICD-10-CM | POA: Diagnosis not present

## 2022-06-25 DIAGNOSIS — Z7984 Long term (current) use of oral hypoglycemic drugs: Secondary | ICD-10-CM | POA: Diagnosis not present

## 2022-06-25 DIAGNOSIS — Z1152 Encounter for screening for COVID-19: Secondary | ICD-10-CM | POA: Diagnosis not present

## 2022-06-25 DIAGNOSIS — Z794 Long term (current) use of insulin: Secondary | ICD-10-CM | POA: Diagnosis not present

## 2022-06-25 DIAGNOSIS — R079 Chest pain, unspecified: Secondary | ICD-10-CM | POA: Diagnosis present

## 2022-06-25 DIAGNOSIS — R0602 Shortness of breath: Secondary | ICD-10-CM | POA: Insufficient documentation

## 2022-06-25 DIAGNOSIS — R0789 Other chest pain: Secondary | ICD-10-CM | POA: Diagnosis not present

## 2022-06-25 LAB — CBC
HCT: 49.4 % (ref 39.0–52.0)
Hemoglobin: 17.1 g/dL — ABNORMAL HIGH (ref 13.0–17.0)
MCH: 27.7 pg (ref 26.0–34.0)
MCHC: 34.6 g/dL (ref 30.0–36.0)
MCV: 80.1 fL (ref 80.0–100.0)
Platelets: 193 10*3/uL (ref 150–400)
RBC: 6.17 MIL/uL — ABNORMAL HIGH (ref 4.22–5.81)
RDW: 13.8 % (ref 11.5–15.5)
WBC: 6.9 10*3/uL (ref 4.0–10.5)
nRBC: 0 % (ref 0.0–0.2)

## 2022-06-25 LAB — BASIC METABOLIC PANEL
Anion gap: 13 (ref 5–15)
BUN: 24 mg/dL — ABNORMAL HIGH (ref 6–20)
CO2: 27 mmol/L (ref 22–32)
Calcium: 10.3 mg/dL (ref 8.9–10.3)
Chloride: 96 mmol/L — ABNORMAL LOW (ref 98–111)
Creatinine, Ser: 1.38 mg/dL — ABNORMAL HIGH (ref 0.61–1.24)
GFR, Estimated: >60 mL/min (ref >60)
Glucose, Bld: 123 mg/dL — ABNORMAL HIGH (ref 70–99)
Potassium: 3.3 mmol/L — ABNORMAL LOW (ref 3.5–5.1)
Sodium: 136 mmol/L (ref 135–145)

## 2022-06-25 LAB — TROPONIN I (HIGH SENSITIVITY)
Troponin I (High Sensitivity): 11 ng/L (ref <18)
Troponin I (High Sensitivity): 10 ng/L (ref <18)

## 2022-06-25 LAB — RESP PANEL BY RT-PCR (RSV, FLU A&B, COVID)  RVPGX2
Influenza A by PCR: NEGATIVE
Influenza B by PCR: NEGATIVE
Resp Syncytial Virus by PCR: NEGATIVE
SARS Coronavirus 2 by RT PCR: NEGATIVE

## 2022-06-25 LAB — D-DIMER, QUANTITATIVE: D-Dimer, Quant: <0.27 ug/mL-FEU (ref 0.00–0.50)

## 2022-06-25 NOTE — ED Triage Notes (Signed)
Patient here POV from Home.  Endorses CP for approximately 3 Days. Went to PCP today and was told his EKG was abnormal and to seek ED Evaluation. Some SOB. Pain is Pressure-Like. Radiates to Left Jaw.   NAD Noted during Triage. A&Ox4. GCS 15. Ambulatory.

## 2022-06-25 NOTE — ED Notes (Signed)
RN provided AVS using Teachback Method. Patient verbalizes understanding of Discharge Instructions. Opportunity for Questioning and Answers were provided by RN. Patient Discharged from ED ambulatory to Home via Self.  

## 2022-06-25 NOTE — ED Provider Notes (Signed)
[facility] EMERGENCY DEPT Provider Note   CSN: [id number] Arrival date & time: 06/25/22  1606     History  Chief Complaint  Patient presents with   Chest Pain    Brandon [name] is a 51 y.o. male.   Chest Pain Patient presents with chest pain.  Has had for 3 days.  Anterior chest.  Worse with exercising.  States that sometimes go to his neck.  No previous known cardiac history.  Does have family history however.  Patient is also going towards transgender male to male and recently started on hormones.  Feels short of breath.  States feels more short of breath with exertion.    Past Medical History:  Diagnosis Date   ADEM (acute disseminated encephalomyelitis) 2014   dx 2014  symptoms started at age 73,  tremors, gait disturbance, weakness (previous seen by neurologist-- dr [facility])   Adhesive capsulitis of left shoulder    Ambulates with cane    Ambulates with cane    Anxiety 2005   Chicken pox    age 69   Chronic constipation    when on opioids   Complication of anesthesia    Depression 2005   Difficult intubation    "throat spasm and clinched endo tube 2013": cervical neck sx 2018 and 2019 with no anesthesia problems   Dyspnea    with exertion   ED (erectile dysfunction)    Headache(784.0)    Hearing loss    both ears   History of transient ischemic attack (TIA) 2000   no residual   Hyperlipidemia    Hypertension    Mono exposure 2013   swollen liver also 2013   OSA on CPAP    cpap autoset to 18 max   Skin abnormalities    dry patches pt does not know name of skin problem   Type 2 diabetes mellitus (HCC)    pancreas makes too much insulin per pt    Home Medications Prior to Admission medications   Medication Sig Start Date End Date Taking? Authorizing Provider  acetaminophen (TYLENOL) 500 MG tablet Take 2 tablets (1,000 mg total) by mouth every 6 (six) hours as needed. 01/20/22 01/20/23  Chadwell, Ivin Booty, PA-C  atorvastatin  (LIPITOR) 20 MG tablet TAKE 1 TABLET (20 MG TOTAL) BY MOUTH AT BEDTIME. Patient taking differently: Take 20 mg by mouth at bedtime. 03/10/18   Weber, Dema Severin, PA-C  chlorthalidone (HYGROTON) 25 MG tablet Take 25 mg by mouth at bedtime. 07/28/21   [provider]  cloNIDine (CATAPRES) 0.1 MG tablet Take 0.1 mg by mouth 3 (three) times daily.    [provider]  colesevelam (WELCHOL) 625 MG tablet Take 3 tablets (1,875 mg total) by mouth daily with breakfast. Patient taking differently: Take 1,875 mg by mouth 2 (two) times daily with a meal. 02/28/18   Weber, Dema Severin, PA-C  cyclobenzaprine (FLEXERIL) 10 MG tablet Take 1 tablet (10 mg total) by mouth 3 (three) times daily as needed. 01/20/22   Chadwell, Ivin Booty, PA-C  gabapentin (NEURONTIN) 300 MG capsule Take 3 capsules (900 mg total) by mouth 2 (two) times daily. Patient taking differently: Take 900 mg by mouth 2 (two) times daily. 1200 pm and 1200 am 08/24/17   Weber, Dema Severin, PA-C  Insulin Pen Needle (BD PEN NEEDLE NANO U/F) 32G X 4 MM MISC 1 Package by Does not apply route 2 (two) times daily. 03/31/21   Quillian Quince D, MD  ketoconazole (NIZORAL) 2 %  cream Apply topically 2 (two) times daily. 10/27/21   [provider]  lisinopril (ZESTRIL) 10 MG tablet Take 10 mg by mouth every evening. 11/15/21   [provider]  meloxicam (MOBIC) 7.5 MG tablet Take 7.5 mg by mouth 2 (two) times daily. 11/15/21   [provider]  metFORMIN (GLUCOPHAGE-XR) 500 MG 24 hr tablet TAKE 2 TABLETS (1,000 MG TOTAL) BY MOUTH 2 (TWO) TIMES DAILY. Patient taking differently: Take 1,000 mg by mouth 2 (two) times daily. TAKE 2 TABLETS (1,000 MG TOTAL) BY MOUTH 2 (TWO) TIMES DAILY. 03/25/18   Georgina Quint, MD  NON FORMULARY CPAP machine at bedtime & sleep    [provider]  ondansetron (ZOFRAN) 4 MG tablet Take 1 tablet (4 mg total) by mouth every 8 (eight) hours as needed for nausea or vomiting. 01/20/22   Chadwell, Ivin Booty,  PA-C  OVER THE COUNTER MEDICATION 2 (two) times daily. Docusate sodium 250 mg 2 tabs bid    [provider]  oxyCODONE (OXY IR/ROXICODONE) 5 MG immediate release tablet Take one tab po q4-6hrs prn pain, may need 1-2 first couple weeks 01/20/22   Chadwell, Ivin Booty, PA-C  potassium chloride (KLOR-CON) 10 MEQ tablet Take 10 mEq by mouth every evening. 11/27/21   [provider]  Semaglutide, 1 MG/DOSE, 4 MG/3ML SOPN Inject 1 mg as directed once a week for 28 days. 06/22/22 07/20/22  Quillian Quince D, MD  tadalafil (CIALIS) 10 MG tablet Take 10 mg by mouth as needed. 11/17/21   [provider]  Vitamin D, Ergocalciferol, (DRISDOL) 1.25 MG (50000 UNIT) CAPS capsule Take 1 capsule (50,000 Units total) by mouth every 7 (seven) days. 06/03/22   Quillian Quince D, MD      Allergies    Amoxicillin, Penicillin g, Sulfa antibiotics, Cephalosporins, Quinolones, and Sulfamethoxazole    Review of Systems   Review of Systems  Cardiovascular:  Positive for chest pain.    Physical Exam Updated Vital Signs BP 120/76   Pulse 89   Temp (!) 97.1 F (36.2 C)   Resp 20   Ht 5\' 8"  (1.727 m)   Wt 121.6 kg   SpO2 100%   BMI 40.75 kg/m  Physical Exam Vitals and nursing note reviewed.  HENT:     Head: Atraumatic.  Cardiovascular:     Rate and Rhythm: Normal rate and regular rhythm.  Pulmonary:     Breath sounds: No decreased breath sounds.  Chest:     Chest wall: No tenderness.  Musculoskeletal:     Right lower leg: No edema.     Left lower leg: No edema.  Skin:    General: Skin is warm.  Neurological:     Mental Status: He is alert.     ED Results / Procedures / Treatments   Labs (all labs ordered are listed, but only abnormal results are displayed) Labs Reviewed  BASIC METABOLIC PANEL - Abnormal; Notable for the following components:      Result Value   Potassium 3.3 (*)    Chloride 96 (*)    Glucose, Bld 123 (*)    BUN 24 (*)    Creatinine, Ser 1.38 (*)    All  other components within normal limits  CBC - Abnormal; Notable for the following components:   RBC 6.17 (*)    Hemoglobin 17.1 (*)    All other components within normal limits  RESP PANEL BY RT-PCR (RSV, FLU A&B, COVID)  [id number]  D-DIMER, QUANTITATIVE  TROPONIN I (  HIGH SENSITIVITY)  TROPONIN I (HIGH SENSITIVITY)    EKG EKG Interpretation  Date/Time:  Thursday June 25 2022 16:20:21 EST Ventricular Rate:  85 PR Interval:  200 QRS Duration: 118 QT Interval:  374 QTC Calculation: 445 R Axis:   240 Text Interpretation: Normal sinus rhythm Right superior axis deviation Cannot rule out Anterior infarct (cited on or before 20-Jan-2022) Abnormal ECG When compared with ECG of 20-Jan-2022 07:42, QRS axis Shifted left Confirmed by Benjiman Core [id number]) on 06/25/2022 6:36:33 PM  Radiology DG Chest 2 View  Result Date: 06/25/2022 CLINICAL DATA:  Chest pain. EXAM: CHEST - 2 VIEW COMPARISON:  Chest two views 05/15/2018 FINDINGS: Cardiac silhouette and mediastinal contours are within normal limits. The lungs are clear. No pleural effusion or pneumothorax. Mild multilevel degenerative disc changes of the thoracic spine. Minimal dextrocurvature of the midthoracic spine. ACDF hardware overlies the cervical spine. IMPRESSION: No active cardiopulmonary disease. Electronically Signed   By: Neita Garnet M.D.   On: 06/25/2022 17:23    Procedures Procedures    Medications Ordered in ED Medications - No data to display  ED Course/ Medical Decision Making/ A&P                           Medical Decision Making Amount and/or Complexity of Data Reviewed Labs: ordered. Radiology: ordered.   Patient with chest pain and shortness of breath.  Anterior chest.  I think higher risk for pulmonary embolism with exogenous hormones.  D-dimer done was negative however.  EKG reassuring here.  Troponin negative x 2.  Doubt cardiac ischemia.  Chest x-ray reassuring.  Negative flu COVID and RSV.  Appears  stable for discharge home.  However does have family history of cardiac disease and does have somewhat exertional pain.  Will have follow-up with cardiology.        Final Clinical Impression(s) / ED Diagnoses Final diagnoses:  Nonspecific chest pain    Rx / DC Orders ED Discharge Orders          Ordered    Ambulatory referral to Cardiology       Comments: If you have not heard from the Cardiology office within the next 72 hours please call [phone].   06/25/22 2028              Benjiman Core, MD 06/25/22 2324

## 2022-07-01 ENCOUNTER — Encounter: Payer: Self-pay | Admitting: Interventional Cardiology

## 2022-07-01 ENCOUNTER — Ambulatory Visit: Payer: 59 | Attending: Interventional Cardiology | Admitting: Interventional Cardiology

## 2022-07-01 VITALS — BP 118/76 | HR 99 | Ht 68.0 in | Wt 265.0 lb

## 2022-07-01 DIAGNOSIS — R0602 Shortness of breath: Secondary | ICD-10-CM | POA: Diagnosis not present

## 2022-07-01 DIAGNOSIS — E876 Hypokalemia: Secondary | ICD-10-CM

## 2022-07-01 DIAGNOSIS — E782 Mixed hyperlipidemia: Secondary | ICD-10-CM | POA: Diagnosis not present

## 2022-07-01 DIAGNOSIS — R072 Precordial pain: Secondary | ICD-10-CM

## 2022-07-01 MED ORDER — METOPROLOL TARTRATE 100 MG PO TABS: ORAL_TABLET | ORAL | 0 refills | Status: DC

## 2022-07-01 NOTE — Progress Notes (Signed)
Cardiology Office Note   Date:  07/01/2022   ID:  Brandon [name], DOB March 28, 1971, MRN [id number]  PCP:  Mancel Bale, PA-C    No chief complaint on file.  Chest pain  Wt Readings from Last 3 Encounters:  07/01/22 265 lb (120.2 kg)  06/25/22 268 lb (121.6 kg)  06/01/22 274 lb (124.3 kg)       History of Present Illness: Brandon [name] is a 51 y.o. male who is being seen today for the evaluation of chest discomfort at the request of Davonna Belling, MD.   He was seen in the emergency room a few days ago with the records showing: "chest pain. Has had for 3 days. Anterior chest. Worse with exercising. States that sometimes go to his neck. No previous known cardiac history. Does have family history however. Patient is also going towards transgender male to male and recently started on hormones. Feels short of breath. States feels more short of breath with exertion.   I think higher risk for pulmonary embolism with exogenous hormones.  D-dimer done was negative however.  EKG reassuring here.  Troponin negative x 2.  Doubt cardiac ischemia.  Chest x-ray reassuring.  Negative flu COVID and RSV.  Appears stable for discharge home.  However does have family history of cardiac disease and does have somewhat exertional pain.  Will have follow-up with cardiology."  Continues to have chest discomfort but the shortness of breath is more noticeable.  It happens even if he talks for prolonged periods of time.  Denies :  Dizziness. Leg edema. Nitroglycerin use. Orthopnea. Palpitations. Paroxysmal nocturnal dyspnea.  Syncope.     Past Medical History:  Diagnosis Date   ADEM (acute disseminated encephalomyelitis) 2014   dx 2014  symptoms started at age 72,  tremors, gait disturbance, weakness (previous seen by neurologist-- dr Sarajane Marek [facility])   Adhesive capsulitis of left shoulder    Ambulates with cane    Ambulates with cane    Anxiety 2005   Chicken pox    age 54   Chronic  constipation    when on opioids   Complication of anesthesia    Depression 2005   Difficult intubation    "throat spasm and clinched endo tube 2013": cervical neck sx 2018 and 2019 with no anesthesia problems   Dyspnea    with exertion   ED (erectile dysfunction)    Headache(784.0)    Hearing loss    both ears   History of transient ischemic attack (TIA) 2000   no residual   Hyperlipidemia    Hypertension    Mono exposure 2013   swollen liver also 2013   OSA on CPAP    cpap autoset to 18 max   Skin abnormalities    dry patches pt does not know name of skin problem   Type 2 diabetes mellitus ([place])    pancreas makes too much insulin per pt    Past Surgical History:  Procedure Laterality Date   ANTERIOR CERVICAL DECOMP/DISCECTOMY FUSION N/A 10/21/2016   Procedure: ANTERIOR CERVICAL DECOMPRESSION/DISCECTOMY FUSION, INTERBODY PROSTHESIS,PLATE CERVICAL FOUR- CERVICAL FIVE, CERVICAL FIVE- CERVICAL SIX;  Surgeon: Newman Pies, MD;  Location: [place];  Service: Neurosurgery;  Laterality: N/A;   ANTERIOR CERVICAL DECOMP/DISCECTOMY FUSION N/A 05/19/2018   Procedure: ANTERIOR CERVICAL DECOMPRESSION/DISCECTOMY FUSION, INTERBODY PROSTHESIS,PLATE/SCREWS CERVICAL SIX- CERVICAL SEVEN; EXPLORE FUSION;  Surgeon: Newman Pies, MD;  Location: [place];  Service: Neurosurgery;  Laterality: N/A;   CHOLECYSTECTOMY  08/02/2011  Procedure: LAPAROSCOPIC CHOLECYSTECTOMY;  Surgeon: Harl Bowie, MD;  Location: [place];  Service: General;;   COLONOSCOPY  10/2020   ESOPHAGOGASTRODUODENOSCOPY  07/28/2011   Procedure: ESOPHAGOGASTRODUODENOSCOPY (EGD);  Surgeon: Scarlette Shorts, MD;  Location: [facility] ENDOSCOPY;  Service: Endoscopy;  Laterality: N/A;   SHOULDER CLOSED REDUCTION Left 01/20/2022   Procedure: CLOSED MANIPULATION SHOULDER;  Surgeon: Renette Butters, MD;  Location: [facility];  Service: Orthopedics;  Laterality: Left;   TONSILECTOMY/ADENOIDECTOMY WITH MYRINGOTOMY Bilateral 1981    TYMPANOSTOMY TUBE PLACEMENT Bilateral 1979     Current Outpatient Medications  Medication Sig Dispense Refill   atorvastatin (LIPITOR) 20 MG tablet TAKE 1 TABLET (20 MG TOTAL) BY MOUTH AT BEDTIME. (Patient taking differently: Take 20 mg by mouth at bedtime.) 30 tablet 0   chlorthalidone (HYGROTON) 25 MG tablet Take 25 mg by mouth at bedtime.     cloNIDine (CATAPRES) 0.1 MG tablet Take 0.1 mg by mouth 3 (three) times daily.     colesevelam (WELCHOL) 625 MG tablet Take 3 tablets (1,875 mg total) by mouth daily with breakfast. (Patient taking differently: Take 1,875 mg by mouth 2 (two) times daily with a meal.) 540 tablet 1   cyclobenzaprine (FLEXERIL) 10 MG tablet Take 1 tablet (10 mg total) by mouth 3 (three) times daily as needed. 60 tablet 0   estradiol (VIVELLE-DOT) 0.05 MG/24HR patch Place 1 patch onto the skin 2 (two) times a week.     gabapentin (NEURONTIN) 300 MG capsule Take 3 capsules (900 mg total) by mouth 2 (two) times daily. (Patient taking differently: Take 900 mg by mouth 2 (two) times daily. 1200 pm and 1200 am) 540 capsule 1   ketoconazole (NIZORAL) 2 % cream Apply topically 2 (two) times daily.     lisinopril (ZESTRIL) 10 MG tablet Take 10 mg by mouth every evening.     meloxicam (MOBIC) 7.5 MG tablet Take 7.5 mg by mouth 2 (two) times daily.     metFORMIN (GLUCOPHAGE-XR) 500 MG 24 hr tablet TAKE 2 TABLETS (1,000 MG TOTAL) BY MOUTH 2 (TWO) TIMES DAILY. (Patient taking differently: Take 1,000 mg by mouth 2 (two) times daily. TAKE 2 TABLETS (1,000 MG TOTAL) BY MOUTH 2 (TWO) TIMES DAILY.) 360 tablet 1   NON FORMULARY CPAP machine at bedtime & sleep     ondansetron (ZOFRAN) 4 MG tablet Take 1 tablet (4 mg total) by mouth every 8 (eight) hours as needed for nausea or vomiting. 20 tablet 0   OVER THE COUNTER MEDICATION 2 (two) times daily. Docusate sodium 250 mg 2 tabs bid     potassium chloride (KLOR-CON) 10 MEQ tablet Take 10 mEq by mouth every evening.     Semaglutide, 1 MG/DOSE, 4  MG/3ML SOPN Inject 1 mg as directed once a week for 28 days. 3 mL 0   spironolactone (ALDACTONE) 50 MG tablet Take 50 mg by mouth 2 (two) times daily.     tadalafil (CIALIS) 10 MG tablet Take 10 mg by mouth as needed.     Vitamin D, Ergocalciferol, (DRISDOL) 1.25 MG (50000 UNIT) CAPS capsule Take 1 capsule (50,000 Units total) by mouth every 7 (seven) days. 5 capsule 0   acetaminophen (TYLENOL) 500 MG tablet Take 2 tablets (1,000 mg total) by mouth every 6 (six) hours as needed. (Patient not taking: Reported on 07/01/2022) 100 tablet 2   ASPIRIN LOW DOSE 81 MG tablet Take 81 mg by mouth daily.     Insulin Pen Needle (BD PEN NEEDLE NANO U/F)  32G X 4 MM MISC 1 Package by Does not apply route 2 (two) times daily. (Patient not taking: Reported on 07/01/2022) 100 each 0   oxyCODONE (OXY IR/ROXICODONE) 5 MG immediate release tablet Take one tab po q4-6hrs prn pain, may need 1-2 first couple weeks (Patient not taking: Reported on 07/01/2022) 40 tablet 0   No current facility-administered medications for this visit.    Allergies:   Amoxicillin, Penicillin g, Sulfa antibiotics, Cephalosporins, Quinolones, and Sulfamethoxazole    Social History:  The patient  reports that he quit smoking about 34 years ago. His smoking use included cigarettes. He has a 9.00 pack-year smoking history. He has never used smokeless tobacco. He reports that he does not currently use alcohol. He reports that he does not currently use drugs after having used the following drugs: Marijuana. Mother with MI at age 38.   Family History:  The patient's family history includes Alcoholism in his father; Cancer in his father, maternal grandmother, and another family member; Coronary artery disease in his father; Diabetes in his mother; High blood pressure in his father; Obesity in his mother; Stroke in his father.    ROS:  Please see the history of present illness.   Otherwise, review of systems are positive for DOE.   All other systems  are reviewed and negative.    PHYSICAL EXAM: VS:  BP 118/76   Pulse 99   Ht 5' 8" (1.727 m)   Wt 265 lb (120.2 kg)   SpO2 97%   BMI 40.29 kg/m  , BMI Body mass index is 40.29 kg/m. GEN: Well nourished, well developed, in no acute distress HEENT: normal Neck: no JVD, carotid bruits, or masses Cardiac: RRR; no murmurs, rubs, or gallops,no edema  Respiratory:  clear to auscultation bilaterally, normal work of breathing GI: soft, nontender, nondistended, + BS MS: no deformity or atrophy Skin: warm and dry, no rash Neuro:  Strength and sensation are intact Psych: euthymic mood, full affect   EKG:   The ekg ordered 12/21 demonstrates normal sinus rhythm, poor R wave progression, nonspecific ST-T wave changes   Recent Labs: 04/30/2022: ALT 54; TSH 2.970 06/25/2022: BUN 24; Creatinine, Ser 1.38; Hemoglobin 17.1; Platelets 193; Potassium 3.3; Sodium 136   Lipid Panel    Component Value Date/Time   CHOL 100 04/30/2022 0908   TRIG 370 (H) 04/30/2022 0908   HDL 27 (L) 04/30/2022 0908   CHOLHDL 3.4 08/24/2017 1458   VLDL 70 (H) 10/15/2015 1112   LDLCALC 21 04/30/2022 0908   LDLCALC 57 08/24/2017 1458     Other studies Reviewed: Additional studies/ records that were reviewed today with results demonstrating: ER records reviewed.   ASSESSMENT AND PLAN:  Chest discomfort: Some typical and some atypical features.  Risk factors for CAD.  I do not think he would be able to walk long enough for an ETT.  Would plan for coronary CTA.  Plan for metoprolol 100 mg prior to scan.  Sx are more shortness of breath, even worse with talking.   Check an echocardiogram.  Now on HRT patch for gender transition.  Negative D-dimer in ER Hyperlipidemia: Managed with atorvastatin.  May need to adjust dose based on calcium score.  Whole food, plant-based diet.  LDL 21 in 10/23. Hypokalemia: Cr mildly increased at recent ER visit.  He does not have a thirst reponse per his report, perhaps from his prior  brain injury.  I stressed drinking more water.  Recheck be met today. Morbid obesity:  Working on weight loss.   Current medicines are reviewed at length with the patient today.  The patient concerns regarding his medicines were addressed.  The following changes have been made:  No change  Labs/ tests ordered today include:  No orders of the defined types were placed in this encounter.   Recommend 150 minutes/week of aerobic exercise Low fat, low carb, high fiber diet recommended  Disposition:   FU in based on test results   Signed, Larae Grooms, MD  07/01/2022 2:46 PM    [facility] [address], [place], [place]  [address] Phone: [phone]; Fax: [phone]

## 2022-07-01 NOTE — Patient Instructions (Addendum)
Medication Instructions:  Your physician recommends that you continue on your current medications as directed. Please refer to the Current Medication list given to you today.  *If you need a refill on your cardiac medications before your next appointment, please call your pharmacy*   Lab Work: Lab work to be done today--BMP If you have labs (blood work) drawn today and your tests are completely normal, you will receive your results only by: MyChart Message (if you have MyChart) OR A paper copy in the mail If you have any lab test that is abnormal or we need to change your treatment, we will call you to review the results.   Testing/Procedures: Your physician has requested that you have an echocardiogram. Echocardiography is a painless test that uses sound waves to create images of your heart. It provides your doctor with information about the size and shape of your heart and how well your heart's chambers and valves are working. This procedure takes approximately one hour. There are no restrictions for this procedure. Please do NOT wear cologne, perfume, aftershave, or lotions (deodorant is allowed). Please arrive 15 minutes prior to your appointment time.  Your physician has requested that you have cardiac CT. Cardiac computed tomography (CT) is a painless test that uses an x-ray machine to take clear, detailed pictures of your heart. For further information please visit [email or web address] Please follow instruction sheet as given.     Follow-Up: At [facility], you and your health needs are our priority.  As part of our continuing mission to provide you with exceptional heart care, we have created designated Provider Care Teams.  These Care Teams include your primary Cardiologist (physician) and Advanced Practice Providers (APPs -  Physician Assistants and Nurse Practitioners) who all work together to provide you with the care you need, when you need it.  We recommend signing  up for the patient portal called "MyChart".  Sign up information is provided on this After Visit Summary.  MyChart is used to connect with patients for Virtual Visits (Telemedicine).  Patients are able to view lab/test results, encounter notes, upcoming appointments, etc.  Non-urgent messages can be sent to your provider as well.   To learn more about what you can do with MyChart, go to [email or web address].    Your next appointment:   Based on results  The format for your next appointment:   In Person  Provider:   Lance Muss, MD     Other Instructions   Your cardiac CT will be scheduled at one of the below locations:   [facility] [address] [place], [place] [address] [phone]  OR  [facility] [address] [place], [place] [address] [phone]  OR   [facility] [address] [place], [place] [address] [phone]  If scheduled at [facility], please arrive at the [facility] and Children's Entrance (Entrance C2) of [facility] 30 minutes prior to test start time. You can use the FREE valet parking offered at entrance C (encouraged to control the heart rate for the test)  Proceed to the [facility] Radiology Department (first floor) to check-in and test prep.  All radiology patients and guests should use entrance C2 at [facility], accessed from [facility], even though the hospital's physical address listed is [address]    If scheduled at [facility] or [facility], please arrive 15 mins  early for check-in and test prep.   Please follow these instructions carefully (unless otherwise directed):  Hold all erectile dysfunction medications at least 3 days (72 hrs) prior to test. (Ie viagra, cialis, sildenafil, tadalafil, etc) We will administer nitroglycerin during this  exam.   On the Night Before the Test: Be sure to Drink plenty of water. Do not consume any caffeinated/decaffeinated beverages or chocolate 12 hours prior to your test. Do not take any antihistamines 12 hours prior to your test.  On the Day of the Test: Drink plenty of water until 1 hour prior to the test. Do not eat any food 1 hour prior to test. You may take your regular medications prior to the test.  Take metoprolol (Lopressor) two hours prior to test. HOLD Furosemide/Hydrochlorothiazide morning of the test. FEMALES- please wear underwire-free bra if available, avoid dresses & tight clothing     After the Test: Drink plenty of water. After receiving IV contrast, you may experience a mild flushed feeling. This is normal. On occasion, you may experience a mild rash up to 24 hours after the test. This is not dangerous. If this occurs, you can take Benadryl 25 mg and increase your fluid intake. If you experience trouble breathing, this can be serious. If it is severe call 911 IMMEDIATELY. If it is mild, please call our office. If you take any of these medications: Glipizide/Metformin, Avandament, Glucavance, please do not take 48 hours after completing test unless otherwise instructed.  We will call to schedule your test 2-4 weeks out understanding that some insurance companies will need an authorization prior to the service being performed.   For non-scheduling related questions, please contact the cardiac imaging nurse navigator should you have any questions/concerns: Rockwell Alexandria, Cardiac Imaging Nurse Navigator Larey Brick, Cardiac Imaging Nurse Navigator [facility] Direct Office Dial: [phone]   For scheduling needs, including cancellations and rescheduling, please call [place], [phone].   Important Information About Sugar

## 2022-07-02 LAB — BASIC METABOLIC PANEL
BUN/Creatinine Ratio: 19 (ref 9–20)
BUN: 23 mg/dL (ref 6–24)
CO2: 25 mmol/L (ref 20–29)
Calcium: 10.7 mg/dL — ABNORMAL HIGH (ref 8.7–10.2)
Chloride: 93 mmol/L — ABNORMAL LOW (ref 96–106)
Creatinine, Ser: 1.19 mg/dL (ref 0.76–1.27)
Glucose: 96 mg/dL (ref 70–99)
Potassium: 3.8 mmol/L (ref 3.5–5.2)
Sodium: 136 mmol/L (ref 134–144)
eGFR: 74 mL/min/{1.73_m2} (ref >59)

## 2022-07-07 ENCOUNTER — Ambulatory Visit (INDEPENDENT_AMBULATORY_CARE_PROVIDER_SITE_OTHER): Payer: 59 | Admitting: Family Medicine

## 2022-07-07 ENCOUNTER — Encounter (INDEPENDENT_AMBULATORY_CARE_PROVIDER_SITE_OTHER): Payer: Self-pay | Admitting: Family Medicine

## 2022-07-07 ENCOUNTER — Other Ambulatory Visit (HOSPITAL_COMMUNITY): Payer: Self-pay

## 2022-07-07 ENCOUNTER — Telehealth (HOSPITAL_COMMUNITY): Payer: Self-pay | Admitting: *Deleted

## 2022-07-07 VITALS — BP 128/79 | HR 95 | Temp 97.9°F | Ht 68.0 in | Wt 259.0 lb

## 2022-07-07 DIAGNOSIS — Z7985 Long-term (current) use of injectable non-insulin antidiabetic drugs: Secondary | ICD-10-CM

## 2022-07-07 DIAGNOSIS — E119 Type 2 diabetes mellitus without complications: Secondary | ICD-10-CM

## 2022-07-07 DIAGNOSIS — E669 Obesity, unspecified: Secondary | ICD-10-CM

## 2022-07-07 DIAGNOSIS — Z6839 Body mass index (BMI) 39.0-39.9, adult: Secondary | ICD-10-CM

## 2022-07-07 DIAGNOSIS — E559 Vitamin D deficiency, unspecified: Secondary | ICD-10-CM

## 2022-07-07 MED ORDER — VITAMIN D (ERGOCALCIFEROL) 1.25 MG (50000 UNIT) PO CAPS: 50000.0000 [IU] | ORAL_CAPSULE | ORAL | 0 refills | Status: DC

## 2022-07-07 MED ORDER — SEMAGLUTIDE (1 MG/DOSE) 4 MG/3ML ~~LOC~~ SOPN
1.0000 mg | PEN_INJECTOR | SUBCUTANEOUS | 0 refills | Status: DC
  Filled 2022-07-07: qty 3, 28d supply, fill #0

## 2022-07-07 NOTE — Telephone Encounter (Signed)
Reaching out to patient to offer assistance regarding upcoming cardiac imaging study; pt verbalizes understanding of appt date/time, parking situation and where to check in, pre-test NPO status and medications ordered, and verified current allergies; name and call back number provided for further questions should they arise  Kieffer Blatz RN Navigator Cardiac Imaging [facility] [phone] office [phone] cell  Patient to take 100mg metoprolol tartrate two hours prior to his cardiac CT scan. He is aware to arrive at 12:30 pm. 

## 2022-07-08 ENCOUNTER — Other Ambulatory Visit (HOSPITAL_COMMUNITY): Payer: Self-pay

## 2022-07-09 ENCOUNTER — Other Ambulatory Visit (HOSPITAL_COMMUNITY): Payer: Self-pay

## 2022-07-09 ENCOUNTER — Other Ambulatory Visit (HOSPITAL_COMMUNITY): Payer: Self-pay | Admitting: *Deleted

## 2022-07-09 ENCOUNTER — Ambulatory Visit (HOSPITAL_COMMUNITY)
Admission: RE | Admit: 2022-07-09 | Discharge: 2022-07-09 | Disposition: A | Discharge status: Home / Self Care | Payer: 59 | Source: Ambulatory Visit | Attending: Interventional Cardiology | Admitting: Interventional Cardiology

## 2022-07-09 ENCOUNTER — Other Ambulatory Visit (HOSPITAL_COMMUNITY): Payer: 59

## 2022-07-09 ENCOUNTER — Encounter (HOSPITAL_COMMUNITY): Payer: Self-pay

## 2022-07-09 DIAGNOSIS — R072 Precordial pain: Secondary | ICD-10-CM | POA: Insufficient documentation

## 2022-07-09 MED ORDER — DILTIAZEM HCL 25 MG/5ML IV SOLN
INTRAVENOUS | Status: AC
  Administered 2022-07-09: 5 mg via INTRAVENOUS
  Filled 2022-07-09: qty 5

## 2022-07-09 MED ORDER — NITROGLYCERIN 0.4 MG SL SUBL: 0.8000 mg | SUBLINGUAL_TABLET | Freq: Once | SUBLINGUAL | Status: DC

## 2022-07-09 MED ORDER — METOPROLOL TARTRATE 5 MG/5ML IV SOLN
INTRAVENOUS | Status: AC
  Administered 2022-07-09: 10 mg via INTRAVENOUS
  Filled 2022-07-09: qty 10

## 2022-07-09 MED ORDER — METOPROLOL TARTRATE 100 MG PO TABS
ORAL_TABLET | ORAL | 0 refills | Status: DC
  Filled 2022-07-09: qty 1, 1d supply, fill #0

## 2022-07-09 MED ORDER — DILTIAZEM HCL 25 MG/5ML IV SOLN: 5.0000 mg | INTRAVENOUS | Status: DC | PRN

## 2022-07-09 MED ORDER — IVABRADINE HCL 7.5 MG PO TABS
ORAL_TABLET | ORAL | 0 refills | Status: DC
  Filled 2022-07-09: qty 2, 1d supply, fill #0

## 2022-07-09 MED ORDER — METOPROLOL TARTRATE 5 MG/5ML IV SOLN: 5.0000 mg | INTRAVENOUS | Status: DC | PRN

## 2022-07-09 NOTE — Progress Notes (Signed)
Pt's heart rate remains in the mid 80s-90s even with 10 mg IV metoprolol and 5 mg IV diltiazem given. SBP now in the 90s. Pt reports taking 100 mg PO metoprolol prior to arrival as instructed. Dr. Gasper Sells notified. Per MD, cancel scan at this time. CT heart navigator and CT technologist notified. Pt informed of plan and he verbalizes understanding. IV removed and pt discharged home.

## 2022-07-13 ENCOUNTER — Encounter (HOSPITAL_COMMUNITY): Payer: Self-pay

## 2022-07-13 ENCOUNTER — Ambulatory Visit (HOSPITAL_COMMUNITY)
Admission: RE | Admit: 2022-07-13 | Discharge: 2022-07-13 | Disposition: A | Discharge status: Home / Self Care | Payer: 59 | Source: Ambulatory Visit | Attending: Interventional Cardiology | Admitting: Interventional Cardiology

## 2022-07-13 MED ORDER — NITROGLYCERIN 0.4 MG SL SUBL: 0.8000 mg | SUBLINGUAL_TABLET | Freq: Once | SUBLINGUAL | Status: DC

## 2022-07-13 NOTE — Progress Notes (Signed)
Pt here for CT heart. HR in the 80's-90's with SBP 99. Pt had metoprolol and ivabradine prior to arrival. Dr. Aundra Dubin notified. CT to be cancelled. Pt to be scheduled for another test

## 2022-07-14 ENCOUNTER — Other Ambulatory Visit (HOSPITAL_COMMUNITY): Payer: Self-pay

## 2022-07-14 ENCOUNTER — Other Ambulatory Visit (HOSPITAL_COMMUNITY): Payer: Self-pay | Admitting: *Deleted

## 2022-07-14 DIAGNOSIS — E785 Hyperlipidemia, unspecified: Secondary | ICD-10-CM | POA: Diagnosis not present

## 2022-07-14 DIAGNOSIS — G0400 Acute disseminated encephalitis and encephalomyelitis, unspecified: Secondary | ICD-10-CM | POA: Diagnosis not present

## 2022-07-14 DIAGNOSIS — Z79899 Other long term (current) drug therapy: Secondary | ICD-10-CM | POA: Diagnosis not present

## 2022-07-14 DIAGNOSIS — E669 Obesity, unspecified: Secondary | ICD-10-CM | POA: Diagnosis not present

## 2022-07-14 DIAGNOSIS — K602 Anal fissure, unspecified: Secondary | ICD-10-CM | POA: Diagnosis not present

## 2022-07-14 DIAGNOSIS — E1169 Type 2 diabetes mellitus with other specified complication: Secondary | ICD-10-CM | POA: Diagnosis not present

## 2022-07-14 DIAGNOSIS — F640 Transsexualism: Secondary | ICD-10-CM | POA: Diagnosis not present

## 2022-07-14 DIAGNOSIS — R110 Nausea: Secondary | ICD-10-CM | POA: Diagnosis not present

## 2022-07-14 DIAGNOSIS — R42 Dizziness and giddiness: Secondary | ICD-10-CM | POA: Diagnosis not present

## 2022-07-14 DIAGNOSIS — Z79890 Hormone replacement therapy (postmenopausal): Secondary | ICD-10-CM | POA: Diagnosis not present

## 2022-07-14 DIAGNOSIS — R351 Nocturia: Secondary | ICD-10-CM | POA: Diagnosis not present

## 2022-07-14 MED ORDER — METOPROLOL TARTRATE 100 MG PO TABS
ORAL_TABLET | ORAL | 0 refills | Status: AC
  Filled 2022-07-14: qty 1, 1d supply, fill #0

## 2022-07-14 MED ORDER — IVABRADINE HCL 7.5 MG PO TABS
ORAL_TABLET | ORAL | 0 refills | Status: AC
  Filled 2022-07-14: qty 2, 1d supply, fill #0

## 2022-07-15 ENCOUNTER — Encounter: Payer: Self-pay | Admitting: Adult Health

## 2022-07-15 ENCOUNTER — Ambulatory Visit (INDEPENDENT_AMBULATORY_CARE_PROVIDER_SITE_OTHER): Payer: 59 | Admitting: Adult Health

## 2022-07-15 VITALS — BP 127/78 | HR 94 | Ht 68.0 in | Wt 256.8 lb

## 2022-07-15 DIAGNOSIS — G4733 Obstructive sleep apnea (adult) (pediatric): Secondary | ICD-10-CM | POA: Diagnosis not present

## 2022-07-15 NOTE — Progress Notes (Signed)
PATIENT: Brandon [name] DOB: 01-Jun-1971  REASON FOR VISIT: follow up HISTORY FROM: patient  Chief Complaint  Patient presents with   Follow-up    Rm 4, cpap. Brought sd card.      HISTORY OF PRESENT ILLNESS: Today 07/15/22:   Brandon [name] is a 52 year old male with a history of obstructive sleep apnea on CPAP.  He returns today for follow-up.  He reports that the CPAP is working well.  Denies any new issues. Would like a new machine.     Brandon [name] is a 52 year old male with a history of obstructive sleep apnea on CPAP.  He returns today for follow-up.  He reports that the CPAP is working well for him.  He would like a new machine.  He returns today for an evaluation.  08/06/20: Brandon [name] is a 52 year old male with a history of obstructive sleep apnea on CPAP.  He reports that the CPAP continues to work well for him.  He denies any new issues.  Reports that he tries to change out his supplies regularly.  His download is below:  Compliance Report Usage 07/07/2020 - 08/05/2020 Usage days 29/30 days (97%) >= 4 hours 22 days (73%) Average usage (days used) 5 hours 56 minutes  AirSense 10 AutoSet Serial number [id number] Mode AutoSet Min Pressure 5 cmH2O Max Pressure 18 cmH2O EPR Fulltime EPR level 3  Therapy Pressure - cmH2O Median: 12.7 95th percentile: 17.4 Maximum: 17.7 Leaks - L/min Median: 1.5 95th percentile: 20.4 Maximum: 40.4 Events per hour AI: 1.5 HI: 0.6 AHI: 2.1 Apnea Index Central: 0.0 Obstructive: 1.4 Unknown: 0.1  HISTORY 05/16/19:   Brandon [name] is a 52 year old male with a history of obstructive sleep apnea on CPAP.  His download indicates that he use his machine nightly for compliance of 100%.  He uses machine greater than 4 hours 81 out of 90 days for compliance of 90%.  On average he uses his machine 8 hours and 2 minutes.  His residual AHI is 1.4 on 5 to 18 cm water with EPR 3.  His leak in the 95th percentile is 25.8 L/min.  He reports that the CPAP is  working well for him.  He returns today for an evaluation.He reports CPAP is working well for him.   REVIEW OF SYSTEMS: Out of a complete 14 system review of symptoms, the patient complains only of the following symptoms, and all other reviewed systems are negative.   ESS 4  ALLERGIES: Allergies  Allergen Reactions   Amoxicillin Anaphylaxis    Has patient had a PCN reaction causing immediate rash, facial/tongue/throat swelling, SOB or lightheadedness with hypotension: Yes Has patient had a PCN reaction causing severe rash involving mucus membranes or skin necrosis: No Has patient had a PCN reaction that required hospitalization: No Has patient had a PCN reaction occurring within the last 10 years: No If all of the above answers are "NO", then may proceed with Cephalosporin use.    Penicillin G Anaphylaxis    Gets extremely high fevers. Has patient had a PCN reaction causing immediate rash, facial/tongue/throat swelling, SOB or lightheadedness with hypotension: Yes Has patient had a PCN reaction causing severe rash involving mucus membranes or skin necrosis: No Has patient had a PCN reaction that required hospitalization: No Has patient had a PCN reaction occurring within the last 10 years: No If all of the above answers are "NO", then may proceed with Cephalosporin use.    Sulfa Antibiotics Anaphylaxis  It will kill him   Cephalosporins Other (See Comments)    Reaction unspecified.   Quinolones Other (See Comments)    Reaction unspecified   Sulfamethoxazole Swelling    HOME MEDICATIONS: Outpatient Medications Prior to Visit  Medication Sig Dispense Refill   acetaminophen (TYLENOL) 500 MG tablet Take 2 tablets (1,000 mg total) by mouth every 6 (six) hours as needed. 100 tablet 2   ASPIRIN LOW DOSE 81 MG tablet Take 81 mg by mouth daily.     atorvastatin (LIPITOR) 20 MG tablet TAKE 1 TABLET (20 MG TOTAL) BY MOUTH AT BEDTIME. (Patient taking differently: Take 20 mg by mouth at  bedtime.) 30 tablet 0   chlorthalidone (HYGROTON) 25 MG tablet Take 25 mg by mouth at bedtime.     cloNIDine (CATAPRES) 0.1 MG tablet Take 0.1 mg by mouth 3 (three) times daily.     colesevelam (WELCHOL) 625 MG tablet Take 3 tablets (1,875 mg total) by mouth daily with breakfast. (Patient taking differently: Take 1,875 mg by mouth 2 (two) times daily with a meal.) 540 tablet 1   cyclobenzaprine (FLEXERIL) 10 MG tablet Take 1 tablet (10 mg total) by mouth 3 (three) times daily as needed. 60 tablet 0   estradiol (VIVELLE-DOT) 0.05 MG/24HR patch Place 1 patch onto the skin 2 (two) times a week.     gabapentin (NEURONTIN) 300 MG capsule Take 3 capsules (900 mg total) by mouth 2 (two) times daily. (Patient taking differently: Take 900 mg by mouth 2 (two) times daily. 1200 pm and 1200 am) 540 capsule 1   Insulin Pen Needle (BD PEN NEEDLE NANO U/F) 32G X 4 MM MISC 1 Package by Does not apply route 2 (two) times daily. 100 each 0   ivabradine (CORLANOR) 7.5 MG TABS tablet Take 2 tablets (15mg ) TWO hours prior to your cardiac CT scan. 2 tablet 0   ketoconazole (NIZORAL) 2 % cream Apply topically 2 (two) times daily.     lisinopril (ZESTRIL) 10 MG tablet Take 10 mg by mouth every evening.     meloxicam (MOBIC) 7.5 MG tablet Take 7.5 mg by mouth 2 (two) times daily.     metFORMIN (GLUCOPHAGE-XR) 500 MG 24 hr tablet TAKE 2 TABLETS (1,000 MG TOTAL) BY MOUTH 2 (TWO) TIMES DAILY. (Patient taking differently: Take 1,000 mg by mouth 2 (two) times daily. TAKE 2 TABLETS (1,000 MG TOTAL) BY MOUTH 2 (TWO) TIMES DAILY.) 360 tablet 1   metoprolol tartrate (LOPRESSOR) 100 MG tablet Take one tablet by mouth 2 hours prior to CT Scan 1 tablet 0   NON FORMULARY CPAP machine at bedtime & sleep     ondansetron (ZOFRAN) 4 MG tablet Take 1 tablet (4 mg total) by mouth every 8 (eight) hours as needed for nausea or vomiting. 20 tablet 0   OVER THE COUNTER MEDICATION 2 (two) times daily. Docusate sodium 250 mg 2 tabs bid      oxyCODONE (OXY IR/ROXICODONE) 5 MG immediate release tablet Take one tab po q4-6hrs prn pain, may need 1-2 first couple weeks 40 tablet 0   potassium chloride (KLOR-CON) 10 MEQ tablet Take 10 mEq by mouth every evening.     Semaglutide, 1 MG/DOSE, 4 MG/3ML SOPN Inject 1 mg as directed once a week for 28 days. 3 mL 0   spironolactone (ALDACTONE) 50 MG tablet Take 50 mg by mouth 2 (two) times daily.     tadalafil (CIALIS) 10 MG tablet Take 10 mg by mouth as needed.  Vitamin D, Ergocalciferol, (DRISDOL) 1.25 MG (50000 UNIT) CAPS capsule Take 1 capsule (50,000 Units total) by mouth every 7 (seven) days. 5 capsule 0   No facility-administered medications prior to visit.    PAST MEDICAL HISTORY: Past Medical History:  Diagnosis Date   ADEM (acute disseminated encephalomyelitis) 2014   dx 2014  symptoms started at age 70,  tremors, gait disturbance, weakness (previous seen by neurologist-- dr [facility])   Adhesive capsulitis of left shoulder    Ambulates with cane    Ambulates with cane    Anxiety 2005   Chicken pox    age 89   Chronic constipation    when on opioids   Complication of anesthesia    Depression 2005   Difficult intubation    "throat spasm and clinched endo tube 2013": cervical neck sx 2018 and 2019 with no anesthesia problems   Dyspnea    with exertion   ED (erectile dysfunction)    Headache(784.0)    Hearing loss    both ears   History of transient ischemic attack (TIA) 2000   no residual   Hyperlipidemia    Hypertension    Mono exposure 2013   swollen liver also 2013   OSA on CPAP    cpap autoset to 18 max   Skin abnormalities    dry patches pt does not know name of skin problem   Type 2 diabetes mellitus (HCC)    pancreas makes too much insulin per pt    PAST SURGICAL HISTORY: Past Surgical History:  Procedure Laterality Date   ANTERIOR CERVICAL DECOMP/DISCECTOMY FUSION N/A 10/21/2016   Procedure: ANTERIOR CERVICAL DECOMPRESSION/DISCECTOMY  FUSION, INTERBODY PROSTHESIS,PLATE CERVICAL FOUR- CERVICAL FIVE, CERVICAL FIVE- CERVICAL SIX;  Surgeon: Tressie Stalker, MD;  Location: [facility] OR;  Service: Neurosurgery;  Laterality: N/A;   ANTERIOR CERVICAL DECOMP/DISCECTOMY FUSION N/A 05/19/2018   Procedure: ANTERIOR CERVICAL DECOMPRESSION/DISCECTOMY FUSION, INTERBODY PROSTHESIS,PLATE/SCREWS CERVICAL SIX- CERVICAL SEVEN; EXPLORE FUSION;  Surgeon: Tressie Stalker, MD;  Location: [facility] OR;  Service: Neurosurgery;  Laterality: N/A;   CHOLECYSTECTOMY  08/02/2011   Procedure: LAPAROSCOPIC CHOLECYSTECTOMY;  Surgeon: Shelly Rubenstein, MD;  Location: [facility] OR;  Service: General;;   COLONOSCOPY  10/2020   ESOPHAGOGASTRODUODENOSCOPY  07/28/2011   Procedure: ESOPHAGOGASTRODUODENOSCOPY (EGD);  Surgeon: [name], MD;  Location: [facility] ENDOSCOPY;  Service: Endoscopy;  Laterality: N/A;   SHOULDER CLOSED REDUCTION Left 01/20/2022   Procedure: CLOSED MANIPULATION SHOULDER;  Surgeon: Sheral Apley, MD;  Location: [facility];  Service: Orthopedics;  Laterality: Left;   TONSILECTOMY/ADENOIDECTOMY WITH MYRINGOTOMY Bilateral 1981   TYMPANOSTOMY TUBE PLACEMENT Bilateral 1979    FAMILY HISTORY: Family History  Problem Relation Age of Onset   Diabetes Mother    Obesity Mother    Coronary artery disease Father    Stroke Father    Cancer Father    High blood pressure Father    Alcoholism Father    Cancer Maternal Grandmother        bone   Cancer Other        Stomach -Great-great grandmother   Sleep apnea Neg Hx     SOCIAL HISTORY: Social History   Socioeconomic History   Marital status: Media planner    Spouse name: Not on file   Number of children: 0   Years of education: college   Highest education level: Not on file  Occupational History   Occupation: disabled   Tobacco Use   Smoking status: Former    Packs/day: 3.00  Years: 3.00    Total pack years: 9.00    Types: Cigarettes    Quit date: 08/18/1987    Years since quitting:  34.9   Smokeless tobacco: Never  Vaping Use   Vaping Use: Never used  Substance and Sexual Activity   Alcohol use: Not Currently    Comment: very infrequent   Drug use: Not Currently    Types: Marijuana    Comment: mairjuana last used 2013   Sexual activity: Yes    Partners: Female  Other Topics Concern   Not on file  Social History Narrative   Lives in [place] with 2 domestic male partners "his family unit"   Caffeine: minimal   Social Determinants of Health   Financial Resource Strain: Not on file  Food Insecurity: Not on file  Transportation Needs: Not on file  Physical Activity: Not on file  Stress: Not on file  Social Connections: Not on file  Intimate Partner Violence: Not on file      PHYSICAL EXAM  Vitals:   07/15/22 1317  BP: 127/78  Pulse: 94  Weight: 256 lb 12.8 oz (116.5 kg)  Height: 5\' 8"  (1.727 m)   Body mass index is 39.05 kg/m.  Generalized: Well developed, in no acute distress  Chest: Lungs clear to auscultation bilaterally  Neurological examination  Mentation: Alert oriented to time, place, history taking. Follows all commands speech and language fluent Cranial nerve II-XII: Extraocular movements were full, visual field were full on confrontational test Head turning and shoulder shrug  were normal and symmetric.  normal.    DIAGNOSTIC DATA (LABS, IMAGING, TESTING) - I reviewed patient records, labs, notes, testing and imaging myself where available.  Lab Results  Component Value Date   WBC 6.9 06/25/2022   HGB 17.1 (H) 06/25/2022   HCT 49.4 06/25/2022   MCV 80.1 06/25/2022   PLT 193 06/25/2022      Component Value Date/Time   NA 136 07/01/2022 1521   K 3.8 07/01/2022 1521   CL 93 (L) 07/01/2022 1521   CO2 25 07/01/2022 1521   GLUCOSE 96 07/01/2022 1521   GLUCOSE 123 (H) 06/25/2022 1621   BUN 23 07/01/2022 1521   CREATININE 1.19 07/01/2022 1521   CREATININE 1.02 08/24/2017 1458   CALCIUM 10.7 (H) 07/01/2022 1521   PROT  7.2 04/30/2022 0908   ALBUMIN 4.8 04/30/2022 0908   AST 26 04/30/2022 0908   ALT 54 (H) 04/30/2022 0908   ALKPHOS 66 04/30/2022 0908   BILITOT 0.6 04/30/2022 0908   GFRNONAA >60 06/25/2022 1621   GFRNONAA >89 07/18/2015 1656   GFRAA 112 08/06/2020 0734   GFRAA >89 07/18/2015 1656   Lab Results  Component Value Date   CHOL 100 04/30/2022   HDL 27 (L) 04/30/2022   LDLCALC 21 04/30/2022   TRIG 370 (H) 04/30/2022   CHOLHDL 3.4 08/24/2017   Lab Results  Component Value Date   HGBA1C 5.5 04/30/2022   Lab Results  Component Value Date   VITAMINB12 311 04/30/2022   Lab Results  Component Value Date   TSH 2.970 04/30/2022      ASSESSMENT AND PLAN 52 y.o. year old male  has a past medical history of ADEM (acute disseminated encephalomyelitis) (2014), Adhesive capsulitis of left shoulder, Ambulates with cane, Ambulates with cane, Anxiety (2005), Chicken pox, Chronic constipation, Complication of anesthesia, Depression (2005), Difficult intubation, Dyspnea, ED (erectile dysfunction), Headache(784.0), Hearing loss, History of transient ischemic attack (TIA) (2000), Hyperlipidemia, Hypertension, Mono exposure (2013), OSA on  CPAP, Skin abnormalities, and Type 2 diabetes mellitus (HCC). here with:  OSA on CPAP  - CPAP compliance excellent - Good treatment of AHI  - Encourage patient to use CPAP nightly and > 4 hours each night - Repeat HST then will order new machine - F/U in 1 year or sooner if needed   Butch Penny, MSN, NP-C 07/15/2022, 1:42 PM [facility] [address] [place], [place] [address] [phone]

## 2022-07-17 ENCOUNTER — Ambulatory Visit (HOSPITAL_COMMUNITY)
Admission: RE | Admit: 2022-07-17 | Discharge: 2022-07-17 | Disposition: A | Discharge status: Home / Self Care | Payer: 59 | Source: Ambulatory Visit | Attending: Interventional Cardiology | Admitting: Interventional Cardiology

## 2022-07-17 DIAGNOSIS — R072 Precordial pain: Secondary | ICD-10-CM | POA: Insufficient documentation

## 2022-07-17 DIAGNOSIS — Z539 Procedure and treatment not carried out, unspecified reason: Secondary | ICD-10-CM | POA: Insufficient documentation

## 2022-07-17 MED ORDER — DILTIAZEM HCL 25 MG/5ML IV SOLN
5.0000 mg | INTRAVENOUS | Status: DC | PRN
  Administered 2022-07-17: 10 mg via INTRAVENOUS

## 2022-07-17 MED ORDER — NITROGLYCERIN 0.4 MG SL SUBL: 0.8000 mg | SUBLINGUAL_TABLET | Freq: Once | SUBLINGUAL | Status: DC

## 2022-07-17 MED ORDER — METOPROLOL TARTRATE 5 MG/5ML IV SOLN: 5.0000 mg | INTRAVENOUS | Status: DC | PRN

## 2022-07-17 MED ORDER — DILTIAZEM HCL 25 MG/5ML IV SOLN
INTRAVENOUS | Status: AC
  Filled 2022-07-17: qty 5

## 2022-07-17 NOTE — Progress Notes (Signed)
Pt arrives with a HR of 101. 10 mg IV diltiazem given without any change in HR in the 90-100s. Pt reports taking prescribed 100 mg of metoprolol and 15 mg Ivabradine prior to arrival as instructed. Dr. Gardiner Rhyme notified.  Per MD, cancel scan at this time. Pt informed of plan and verbalizes understanding. IV removed. Pt discharged home.

## 2022-07-20 NOTE — Progress Notes (Unsigned)
Chief Complaint:   OBESITY Brandon [name] is here to discuss his progress with his obesity treatment plan along with follow-up of his obesity related diagnoses. Brandon [name] is on the Category 4 Plan and states he is following his eating plan approximately 30% of the time. Brandon [name] states he is walking for 10 minutes 3 times per week.  Today's visit was #: 65 Starting weight: 305 lbs Starting date: 09/15/2017 Today's weight: 259 lbs Today's date: 07/07/2022 Total lbs lost to date: 46 Total lbs lost since last in-office visit: 15  Interim History: Brandon [name] is working on weight loss, but his appetite has gone way down to the point that he is not getting enough nutrition.  Subjective:   1. Vitamin D deficiency Brandon [name] is on vitamin D, and he is doing well overall.  2. Type 2 diabetes mellitus without complication, unspecified whether long term insulin use (Brandon [name]) Brandon [name] notes polyphagia has resolved but he is not getting enough nutrition.  Assessment/Plan:   1. Vitamin D deficiency Brandon [name] will continue prescription vitamin D, and we will refill for 1 month.  - Vitamin D, Ergocalciferol, (DRISDOL) 1.25 MG (50000 UNIT) CAPS capsule; Take 1 capsule (50,000 Units total) by mouth every 7 (seven) days.  Dispense: 5 capsule; Refill: 0  2. Type 2 diabetes mellitus without complication, unspecified whether long term insulin use (Brandon [name]) Brandon [name] will continue Ozempic, and we will refill for 1 month.  - Semaglutide, 1 MG/DOSE, 4 MG/3ML SOPN; Inject 1 mg as directed once a week for 28 days.  Dispense: 3 mL; Refill: 0  3. Obesity, Current BMI 39.4 Brandon [name] is currently in the action stage of change. As such, his goal is to continue with weight loss efforts. He has agreed to the Category 4 Plan.   Exercise goals: As is.   Behavioral modification strategies: increasing lean protein intake, decreasing simple carbohydrates, and no skipping meals.  Brandon [name] has agreed to follow-up with our clinic in 4 weeks.  He was informed of the importance of frequent follow-up visits to maximize his success with intensive lifestyle modifications for his multiple health conditions.   Objective:   Blood pressure 128/79, pulse 95, temperature 97.9 F (36.6 C), height 5\' 8"  (1.727 m), weight 259 lb (117.5 kg), SpO2 98 %. Body mass index is 39.38 kg/m.  General: Cooperative, alert, well developed, in no acute distress. HEENT: Conjunctivae and lids unremarkable. Cardiovascular: Regular rhythm.  Lungs: Normal work of breathing. Neurologic: No focal deficits.   Lab Results  Component Value Date   CREATININE 1.19 07/01/2022   BUN 23 07/01/2022   NA 136 07/01/2022   K 3.8 07/01/2022   CL 93 (L) 07/01/2022   CO2 25 07/01/2022   Lab Results  Component Value Date   ALT 54 (H) 04/30/2022   AST 26 04/30/2022   ALKPHOS 66 04/30/2022   BILITOT 0.6 04/30/2022   Lab Results  Component Value Date   HGBA1C 5.5 04/30/2022   HGBA1C 5.8 (H) 01/21/2022   HGBA1C 6.7 (H) 08/12/2021   HGBA1C 7.7 (H) 06/04/2021   HGBA1C 7.6 (H) 12/25/2020   Lab Results  Component Value Date   INSULIN 46.8 (H) 04/30/2022   INSULIN 143.0 (H) 01/21/2022   INSULIN 266.0 (H) 08/12/2021   INSULIN 126.0 (H) 06/04/2021   INSULIN 185.0 (H) 08/06/2020   Lab Results  Component Value Date   TSH 2.970 04/30/2022   Lab Results  Component Value Date   CHOL 100 04/30/2022   HDL 27 (L) 04/30/2022   [place]  21 04/30/2022   TRIG 370 (H) 04/30/2022   CHOLHDL 3.4 08/24/2017   Lab Results  Component Value Date   VD25OH 53.7 04/30/2022   VD25OH 68.1 01/21/2022   VD25OH 42.8 08/12/2021   Lab Results  Component Value Date   WBC 6.9 06/25/2022   HGB 17.1 (H) 06/25/2022   HCT 49.4 06/25/2022   MCV 80.1 06/25/2022   PLT 193 06/25/2022   No results found for: "IRON", "TIBC", "FERRITIN"  Attestation Statements:   Reviewed by clinician on day of visit: allergies, medications, problem list, medical history, surgical history, family  history, social history, and previous encounter notes.   I, Trixie Dredge, am acting as transcriptionist for Dennard Nip, MD.  I have reviewed the above documentation for accuracy and completeness, and I agree with the above. -  Dennard Nip, MD

## 2022-07-22 ENCOUNTER — Telehealth: Payer: Self-pay

## 2022-07-22 DIAGNOSIS — R072 Precordial pain: Secondary | ICD-10-CM

## 2022-07-22 NOTE — Telephone Encounter (Signed)
-----  Message from Jettie Booze, MD sent at 07/22/2022  8:51 AM EST ----- Charlesetta Ivory,  WOuld plan for a myoview.  I don't remember if he would be able to exercise or not so either exercise or lexiscan is fine  JV ----- Message ----- From: Eli Hose, RN Sent: 07/17/2022   1:27 PM EST To: Jettie Booze, MD; Lorenza Evangelist, RN; #  Dr. Irish Lack,  We have attempted this CCTA three times.  He had 100mg  metoprolol and 15mg  ivabradine prior to coming in and we gave all of our IV medications. His HR remained in the 90's.  Please consider alternative testing.  Thanks, Kerin Ransom

## 2022-07-22 NOTE — Telephone Encounter (Signed)
Spoke with the patient who states that he is able to get on a treadmill. Exercise myoview has been ordered and instructions reviewed and sent through [place]. He is aware that he will be contacted by our schedulers to arrange.

## 2022-07-23 NOTE — Addendum Note (Signed)
Addended by: Bernestine Amass on: 07/23/2022 07:59 AM   Modules accepted: Orders

## 2022-07-23 NOTE — Addendum Note (Signed)
Addended by: Jettie Booze on: 07/23/2022 08:40 AM   Modules accepted: Orders

## 2022-07-24 ENCOUNTER — Encounter (HOSPITAL_COMMUNITY): Payer: Self-pay | Admitting: *Deleted

## 2022-07-24 ENCOUNTER — Telehealth (HOSPITAL_COMMUNITY): Payer: Self-pay | Admitting: *Deleted

## 2022-07-24 NOTE — Telephone Encounter (Signed)
Sent instructions via My Chart outlining instructions for upcoming stress test

## 2022-07-28 ENCOUNTER — Ambulatory Visit (HOSPITAL_BASED_OUTPATIENT_CLINIC_OR_DEPARTMENT_OTHER): Payer: 59

## 2022-07-28 ENCOUNTER — Ambulatory Visit (HOSPITAL_COMMUNITY): Payer: 59 | Attending: Internal Medicine

## 2022-07-28 DIAGNOSIS — R0602 Shortness of breath: Secondary | ICD-10-CM | POA: Diagnosis not present

## 2022-07-28 DIAGNOSIS — R072 Precordial pain: Secondary | ICD-10-CM | POA: Diagnosis not present

## 2022-07-28 LAB — MYOCARDIAL PERFUSION IMAGING
Angina Index: 0
Duke Treadmill Score: 8
Estimated workload: 8.6
Exercise duration (min): 7 min
Exercise duration (sec): 52 s
LV dias vol: 93 mL (ref 62–150)
LV sys vol: 46 mL
MPHR: 170 {beats}/min
Nuc Stress EF: 50 %
Peak HR: 166 {beats}/min
Percent HR: 97 %
Rest HR: 81 {beats}/min
Rest Nuclear Isotope Dose: 10.5 mCi
SDS: 2
SRS: 0
SSS: 2
ST Depression (mm): 0 mm
Stress Nuclear Isotope Dose: 32.8 mCi
TID: 0.87

## 2022-07-28 LAB — ECHOCARDIOGRAM COMPLETE
Area-P 1/2: 3.78 cm2
S' Lateral: 3.2 cm

## 2022-07-28 MED ORDER — PERFLUTREN LIPID MICROSPHERE
1.0000 mL | INTRAVENOUS | Status: AC | PRN
  Administered 2022-07-28: 2 mL via INTRAVENOUS

## 2022-07-28 MED ORDER — TECHNETIUM TC 99M TETROFOSMIN IV KIT
32.8000 | PACK | Freq: Once | INTRAVENOUS | Status: AC | PRN
  Administered 2022-07-28: 32.8 via INTRAVENOUS

## 2022-07-28 MED ORDER — TECHNETIUM TC 99M TETROFOSMIN IV KIT
10.5000 | PACK | Freq: Once | INTRAVENOUS | Status: AC | PRN
  Administered 2022-07-28: 10.5 via INTRAVENOUS

## 2022-07-28 NOTE — Progress Notes (Signed)
Normal perfusion.  Normal LV by echo which is more accurate.  COntinue risk factor modificatrion with PCP.  F/u with [place] as needed.

## 2022-08-04 ENCOUNTER — Ambulatory Visit (INDEPENDENT_AMBULATORY_CARE_PROVIDER_SITE_OTHER): Payer: 59 | Admitting: Family Medicine

## 2022-08-04 ENCOUNTER — Encounter (INDEPENDENT_AMBULATORY_CARE_PROVIDER_SITE_OTHER): Payer: Self-pay | Admitting: Family Medicine

## 2022-08-04 VITALS — BP 109/72 | HR 84 | Temp 97.7°F | Ht 68.0 in | Wt 255.0 lb

## 2022-08-04 DIAGNOSIS — K5901 Slow transit constipation: Secondary | ICD-10-CM | POA: Insufficient documentation

## 2022-08-04 DIAGNOSIS — E119 Type 2 diabetes mellitus without complications: Secondary | ICD-10-CM | POA: Diagnosis not present

## 2022-08-04 DIAGNOSIS — Z7985 Long-term (current) use of injectable non-insulin antidiabetic drugs: Secondary | ICD-10-CM | POA: Diagnosis not present

## 2022-08-04 DIAGNOSIS — E559 Vitamin D deficiency, unspecified: Secondary | ICD-10-CM

## 2022-08-04 DIAGNOSIS — E669 Obesity, unspecified: Secondary | ICD-10-CM

## 2022-08-04 DIAGNOSIS — Z6838 Body mass index (BMI) 38.0-38.9, adult: Secondary | ICD-10-CM | POA: Diagnosis not present

## 2022-08-04 MED ORDER — SEMAGLUTIDE (1 MG/DOSE) 4 MG/3ML ~~LOC~~ SOPN: 1.0000 mg | PEN_INJECTOR | SUBCUTANEOUS | 0 refills | Status: DC

## 2022-08-04 MED ORDER — POLYETHYLENE GLYCOL 3350 17 GM/SCOOP PO POWD: 17.0000 g | Freq: Every day | ORAL | 0 refills | Status: AC

## 2022-08-04 MED ORDER — VITAMIN D (ERGOCALCIFEROL) 1.25 MG (50000 UNIT) PO CAPS: 50000.0000 [IU] | ORAL_CAPSULE | ORAL | 0 refills | Status: DC

## 2022-08-13 ENCOUNTER — Telehealth: Payer: Self-pay | Admitting: Adult Health

## 2022-08-13 NOTE — Telephone Encounter (Signed)
[employer] no Josem Kaufmann req spoke to Ciro Backer ref # [id number] & Medicaid pending uploaded notes on the portal

## 2022-08-18 NOTE — Progress Notes (Signed)
Chief Complaint:   OBESITY Brandon [name] is here to discuss his progress with his obesity treatment plan along with follow-up of his obesity related diagnoses. Brandon [name] is on the Category 4 Plan and states he is following his eating plan approximately 40% of the time. Brandon [name] states he is walking for 10 minutes 5 times per week.  Today's visit was #: 68 Starting weight: 305 lbs Starting date: 09/15/2017 Today's weight: 255 lbs Today's date: 08/04/2022 Total lbs lost to date: 50 Total lbs lost since last in-office visit: 4  Interim History: Brandon [name] continues to do well with his weight loss. He is working on portion control and trying to increase his protein. He is walking most days of the week and he is getting good support at home.   Subjective:   1. Constipation by delayed colonic transit Brandon [name]'s symptoms are worsening. He can go 5-6 days without a BM. His last colonoscopy was approximately 3 years ago. He has a strong family history of colon cancer.   2. Type 2 diabetes mellitus without complication, unspecified whether long term insulin use ([facility]) Brandon [name] is working on his diet and weight loss. He denies nausea or vomiting, but he notes fatigue.   3. Vitamin D deficiency Brandon [name] is doing well on his Vitamin D, and he requests a refill today.   Assessment/Plan:   1. Constipation by delayed colonic transit Brandon [name] agreed to start MiraLAX prescription 17 grams daily with no refills, with increased liquids.   - polyethylene glycol powder (GLYCOLAX/MIRALAX) 17 GM/SCOOP powder; Take 17 g by mouth daily.  Dispense: 510 g; Refill: 0  2. Type 2 diabetes mellitus without complication, unspecified whether long term insulin use (HCC) We will refill Ozempic for 1 month. Brandon [name] will work on increasing his water intake.   - Semaglutide, 1 MG/DOSE, 4 MG/3ML SOPN; Inject 1 mg as directed once a week for 28 days.  Dispense: 3 mL; Refill: 0  3. Vitamin D deficiency Brandon [name] will continue  prescription Vitamin D, and we will refill for 1 month.   - Vitamin D, Ergocalciferol, (DRISDOL) 1.25 MG (50000 UNIT) CAPS capsule; Take 1 capsule (50,000 Units total) by mouth every 7 (seven) days.  Dispense: 5 capsule; Refill: 0  4. BMI 38.0-38.9,adult  5. Obesity, Beginning BMI 46.38 Brandon [name] is currently in the action stage of change. As such, his goal is to continue with weight loss efforts. He has agreed to the Category 4 Plan.   Exercise goals: As is.   Behavioral modification strategies: increasing water intake.  Brandon [name] has agreed to follow-up with our clinic in 4 weeks. He was informed of the importance of frequent follow-up visits to maximize his success with intensive lifestyle modifications for his multiple health conditions. We will continue to manage this complex ongoing medical condition.  Objective:   Blood pressure 109/72, pulse 84, temperature 97.7 F (36.5 C), height 5' 8"$  (1.727 m), weight 255 lb (115.7 kg), SpO2 97 %. Body mass index is 38.77 kg/m.  General: Cooperative, alert, well developed, in no acute distress. HEENT: Conjunctivae and lids unremarkable. Cardiovascular: Regular rhythm.  Lungs: Normal work of breathing. Neurologic: No focal deficits.   Lab Results  Component Value Date   CREATININE 1.19 07/01/2022   BUN 23 07/01/2022   NA 136 07/01/2022   K 3.8 07/01/2022   CL 93 (L) 07/01/2022   CO2 25 07/01/2022   Lab Results  Component Value Date   ALT 54 (H) 04/30/2022   AST 26 04/30/2022  ALKPHOS 66 04/30/2022   BILITOT 0.6 04/30/2022   Lab Results  Component Value Date   HGBA1C 5.5 04/30/2022   HGBA1C 5.8 (H) 01/21/2022   HGBA1C 6.7 (H) 08/12/2021   HGBA1C 7.7 (H) 06/04/2021   HGBA1C 7.6 (H) 12/25/2020   Lab Results  Component Value Date   INSULIN 46.8 (H) 04/30/2022   INSULIN 143.0 (H) 01/21/2022   INSULIN 266.0 (H) 08/12/2021   INSULIN 126.0 (H) 06/04/2021   INSULIN 185.0 (H) 08/06/2020   Lab Results  Component Value Date    TSH 2.970 04/30/2022   Lab Results  Component Value Date   CHOL 100 04/30/2022   HDL 27 (L) 04/30/2022   LDLCALC 21 04/30/2022   TRIG 370 (H) 04/30/2022   CHOLHDL 3.4 08/24/2017   Lab Results  Component Value Date   VD25OH 53.7 04/30/2022   VD25OH 68.1 01/21/2022   VD25OH 42.8 08/12/2021   Lab Results  Component Value Date   WBC 6.9 06/25/2022   HGB 17.1 (H) 06/25/2022   HCT 49.4 06/25/2022   MCV 80.1 06/25/2022   PLT 193 06/25/2022   No results found for: "IRON", "TIBC", "FERRITIN"  Attestation Statements:   Reviewed by clinician on day of visit: allergies, medications, problem list, medical history, surgical history, family history, social history, and previous encounter notes.   I, Trixie Dredge, am acting as transcriptionist for Dennard Nip, MD.  I have reviewed the above documentation for accuracy and completeness, and I agree with the above. -  Dennard Nip, MD

## 2022-09-01 ENCOUNTER — Encounter (INDEPENDENT_AMBULATORY_CARE_PROVIDER_SITE_OTHER): Payer: Self-pay | Admitting: Family Medicine

## 2022-09-01 ENCOUNTER — Ambulatory Visit (INDEPENDENT_AMBULATORY_CARE_PROVIDER_SITE_OTHER): Payer: 59 | Admitting: Family Medicine

## 2022-09-01 VITALS — BP 98/59 | HR 85 | Temp 98.0°F | Ht 68.0 in | Wt 259.0 lb

## 2022-09-01 DIAGNOSIS — E559 Vitamin D deficiency, unspecified: Secondary | ICD-10-CM | POA: Diagnosis not present

## 2022-09-01 DIAGNOSIS — F640 Transsexualism: Secondary | ICD-10-CM | POA: Diagnosis not present

## 2022-09-01 DIAGNOSIS — E119 Type 2 diabetes mellitus without complications: Secondary | ICD-10-CM

## 2022-09-01 DIAGNOSIS — Z6839 Body mass index (BMI) 39.0-39.9, adult: Secondary | ICD-10-CM | POA: Diagnosis not present

## 2022-09-01 DIAGNOSIS — Z79899 Other long term (current) drug therapy: Secondary | ICD-10-CM | POA: Insufficient documentation

## 2022-09-01 DIAGNOSIS — Z7985 Long-term (current) use of injectable non-insulin antidiabetic drugs: Secondary | ICD-10-CM

## 2022-09-01 DIAGNOSIS — Z6837 Body mass index (BMI) 37.0-37.9, adult: Secondary | ICD-10-CM | POA: Insufficient documentation

## 2022-09-01 DIAGNOSIS — E782 Mixed hyperlipidemia: Secondary | ICD-10-CM | POA: Diagnosis not present

## 2022-09-01 DIAGNOSIS — Z6836 Body mass index (BMI) 36.0-36.9, adult: Secondary | ICD-10-CM | POA: Insufficient documentation

## 2022-09-01 DIAGNOSIS — M5030 Other cervical disc degeneration, unspecified cervical region: Secondary | ICD-10-CM | POA: Diagnosis not present

## 2022-09-01 MED ORDER — GABAPENTIN 300 MG PO CAPS: 900.0000 mg | ORAL_CAPSULE | Freq: Two times a day (BID) | ORAL | 0 refills | Status: AC

## 2022-09-01 MED ORDER — VITAMIN D (ERGOCALCIFEROL) 1.25 MG (50000 UNIT) PO CAPS: 50000.0000 [IU] | ORAL_CAPSULE | ORAL | 0 refills | Status: DC

## 2022-09-01 MED ORDER — SEMAGLUTIDE (1 MG/DOSE) 4 MG/3ML ~~LOC~~ SOPN: 1.0000 mg | PEN_INJECTOR | SUBCUTANEOUS | 0 refills | Status: DC

## 2022-09-02 LAB — MICROALBUMIN / CREATININE URINE RATIO
Creatinine, Urine: 181.7 mg/dL
Microalb/Creat Ratio: 5 mg/g creat (ref 0–29)
Microalbumin, Urine: 9.6 ug/mL

## 2022-09-02 LAB — LIPID PANEL WITH LDL/HDL RATIO
Cholesterol, Total: 116 mg/dL (ref 100–199)
HDL: 35 mg/dL — ABNORMAL LOW (ref >39)
LDL Chol Calc (NIH): 48 mg/dL (ref 0–99)
LDL/HDL Ratio: 1.4 ratio (ref 0.0–3.6)
Triglycerides: 203 mg/dL — ABNORMAL HIGH (ref 0–149)
VLDL Cholesterol Cal: 33 mg/dL (ref 5–40)

## 2022-09-02 LAB — HEMOGLOBIN A1C
Est. average glucose Bld gHb Est-mCnc: 123 mg/dL
Hgb A1c MFr Bld: 5.9 % — ABNORMAL HIGH (ref 4.8–5.6)

## 2022-09-02 LAB — CMP14+EGFR
ALT: 45 IU/L — ABNORMAL HIGH (ref 0–44)
AST: 21 IU/L (ref 0–40)
Albumin/Globulin Ratio: 2 (ref 1.2–2.2)
Albumin: 4.2 g/dL (ref 3.8–4.9)
Alkaline Phosphatase: 56 IU/L (ref 44–121)
BUN/Creatinine Ratio: 17 (ref 9–20)
BUN: 20 mg/dL (ref 6–24)
Bilirubin Total: 0.3 mg/dL (ref 0.0–1.2)
CO2: 25 mmol/L (ref 20–29)
Calcium: 9.8 mg/dL (ref 8.7–10.2)
Chloride: 103 mmol/L (ref 96–106)
Creatinine, Ser: 1.19 mg/dL (ref 0.76–1.27)
Globulin, Total: 2.1 g/dL (ref 1.5–4.5)
Glucose: 84 mg/dL (ref 70–99)
Potassium: 4.6 mmol/L (ref 3.5–5.2)
Sodium: 144 mmol/L (ref 134–144)
Total Protein: 6.3 g/dL (ref 6.0–8.5)
eGFR: 74 mL/min/{1.73_m2} (ref >59)

## 2022-09-02 LAB — VITAMIN D 25 HYDROXY (VIT D DEFICIENCY, FRACTURES): Vit D, 25-Hydroxy: 49 ng/mL (ref 30.0–100.0)

## 2022-09-02 LAB — CBC WITH DIFFERENTIAL/PLATELET
Basophils Absolute: 0 10*3/uL (ref 0.0–0.2)
Basos: 0 %
EOS (ABSOLUTE): 0.3 10*3/uL (ref 0.0–0.4)
Eos: 6 %
Hematocrit: 43.4 % (ref 37.5–51.0)
Hemoglobin: 14.1 g/dL (ref 13.0–17.7)
Immature Grans (Abs): 0 10*3/uL (ref 0.0–0.1)
Immature Granulocytes: 1 %
Lymphocytes Absolute: 1 10*3/uL (ref 0.7–3.1)
Lymphs: 21 %
MCH: 27.6 pg (ref 26.6–33.0)
MCHC: 32.5 g/dL (ref 31.5–35.7)
MCV: 85 fL (ref 79–97)
Monocytes Absolute: 0.4 10*3/uL (ref 0.1–0.9)
Monocytes: 9 %
Neutrophils Absolute: 3.1 10*3/uL (ref 1.4–7.0)
Neutrophils: 63 %
Platelets: 152 10*3/uL (ref 150–450)
RBC: 5.1 x10E6/uL (ref 4.14–5.80)
RDW: 15.1 % (ref 11.6–15.4)
WBC: 4.9 10*3/uL (ref 3.4–10.8)

## 2022-09-02 LAB — INSULIN, RANDOM: INSULIN: 67.1 u[IU]/mL — ABNORMAL HIGH (ref 2.6–24.9)

## 2022-09-02 LAB — TSH: TSH: 2.31 u[IU]/mL (ref 0.450–4.500)

## 2022-09-02 LAB — VITAMIN B12: Vitamin B-12: 480 pg/mL (ref 232–1245)

## 2022-09-21 NOTE — Progress Notes (Signed)
Chief Complaint:   OBESITY Brandon [name] is here to discuss his progress with his obesity treatment plan along with follow-up of his obesity related diagnoses. Brandon [name] is on the Category 4 Plan and states he is following his eating plan approximately 30% of the time. Brandon [name] states he is walking for 10 minutes 2 times per week.  Today's visit was #: 18 Starting weight: 305 lbs Starting date: 09/15/2017 Today's weight: 259 lbs Today's date: 09/01/2022 Total lbs lost to date: 46 Total lbs lost since last in-office visit: 0  Interim History: Brandon [name] has been retaining water weight today.  He has had a lot of health issues going on, but he is working on making healthier food options.  Subjective:   1. Vitamin D deficiency Brandon [name] is on vitamin D, and he is due to have labs rechecked today.  2. Type 2 diabetes mellitus without complication, unspecified whether long term insulin use ([place]) Brandon [name] is working on his diet, and he is doing well on Ozempic.  He denies nausea or vomiting, but he is having some issues with constipation.  3. Mixed hyperlipidemia Brandon [name] is on Lipitor and WelChol, and he is due to have labs rechecked.  4. Transgender person on hormone therapy Brandon [name] PCP asked him to have estradiol and testosterone level drawn.  5. DDD (degenerative disc disease), cervical Brandon [name] is on gabapentin, and he is running out and he requests a refill.  I sent in a refill for 1 month to bridge until his PCP can refill.  He has been stable on his current dose.  Assessment/Plan:   1. Vitamin D deficiency We will check labs today.  Brandon [name] will continue prescription vitamin D, and we will refill for 1 month.  - Vitamin D, Ergocalciferol, (DRISDOL) 1.25 MG (50000 UNIT) CAPS capsule; Take 1 capsule (50,000 Units total) by mouth every 7 (seven) days.  Dispense: 5 capsule; Refill: 0 - VITAMIN D 25 Hydroxy (Vit-D Deficiency, Fractures) - CBC with Differential/Platelet  2. Type 2  diabetes mellitus without complication, unspecified whether long term insulin use (HCC) We will check labs today.  Brandon [name] will continue Ozempic, and we will refill for 1 month.  He is okay to add MiraLAX.  - Semaglutide, 1 MG/DOSE, 4 MG/3ML SOPN; Inject 1 mg as directed once a week for 28 days.  Dispense: 3 mL; Refill: 0 - Microalbumin / creatinine urine ratio - Insulin, random - Hemoglobin A1c - CMP14+EGFR - Vitamin B12  3. Mixed hyperlipidemia We will check labs today.  Brandon [name] will continue Lipitor and WelChol as is.  - TSH - Lipid Panel With LDL/HDL Ratio  4. Transgender person on hormone therapy Estradiol and testosterone were ordered, and Brandon [name] is to follow-up with his PCP for results and treatment.  - Testosterone - Estradiol  5. DDD (degenerative disc disease), cervical Brandon [name] will continue gabapentin, and we will refill for 1 month.  - gabapentin (NEURONTIN) 300 MG capsule; Take 3 capsules (900 mg total) by mouth 2 (two) times daily.  Dispense: 180 capsule; Refill: 0  6. BMI 39.0-39.9,adult  7. Obesity, Beginning BMI 46.38 Brandon [name] is currently in the action stage of change. As such, his goal is to continue with weight loss efforts. He has agreed to practicing portion control and making smarter food choices, such as increasing vegetables and decreasing simple carbohydrates.   Exercise goals: As is.   Behavioral modification strategies: increasing water intake.  Brandon [name] has agreed to follow-up with our clinic in 4 weeks. He was informed of  the importance of frequent follow-up visits to maximize his success with intensive lifestyle modifications for his multiple health conditions.   Brandon [name] was informed we would discuss his lab results at his next visit unless there is a critical issue that needs to be addressed sooner. Brandon [name] agreed to keep his next visit at the agreed upon time to discuss these results.  Objective:   Blood pressure (!) 98/59, pulse 85,  temperature 98 F (36.7 C), height 5\' 8"  (1.727 m), weight 259 lb (117.5 kg), SpO2 97 %. Body mass index is 39.38 kg/m.  General: Cooperative, alert, well developed, in no acute distress. HEENT: Conjunctivae and lids unremarkable. Cardiovascular: Regular rhythm.  Lungs: Normal work of breathing. Neurologic: No focal deficits.   Lab Results  Component Value Date   CREATININE 1.19 09/01/2022   BUN 20 09/01/2022   NA 144 09/01/2022   K 4.6 09/01/2022   CL 103 09/01/2022   CO2 25 09/01/2022   Lab Results  Component Value Date   ALT 45 (H) 09/01/2022   AST 21 09/01/2022   ALKPHOS 56 09/01/2022   BILITOT 0.3 09/01/2022   Lab Results  Component Value Date   HGBA1C 5.9 (H) 09/01/2022   HGBA1C 5.5 04/30/2022   HGBA1C 5.8 (H) 01/21/2022   HGBA1C 6.7 (H) 08/12/2021   HGBA1C 7.7 (H) 06/04/2021   Lab Results  Component Value Date   INSULIN 67.1 (H) 09/01/2022   INSULIN 46.8 (H) 04/30/2022   INSULIN 143.0 (H) 01/21/2022   INSULIN 266.0 (H) 08/12/2021   INSULIN 126.0 (H) 06/04/2021   Lab Results  Component Value Date   TSH 2.310 09/01/2022   Lab Results  Component Value Date   CHOL 116 09/01/2022   HDL 35 (L) 09/01/2022   LDLCALC 48 09/01/2022   TRIG 203 (H) 09/01/2022   CHOLHDL 3.4 08/24/2017   Lab Results  Component Value Date   VD25OH 49.0 09/01/2022   VD25OH 53.7 04/30/2022   VD25OH 68.1 01/21/2022   Lab Results  Component Value Date   WBC 4.9 09/01/2022   HGB 14.1 09/01/2022   HCT 43.4 09/01/2022   MCV 85 09/01/2022   PLT 152 09/01/2022   No results found for: "IRON", "TIBC", "FERRITIN"  Attestation Statements:   Reviewed by clinician on day of visit: allergies, medications, problem list, medical history, surgical history, family history, social history, and previous encounter notes.  I have personally spent 40 minutes total time today in preparation, patient care, and documentation for this visit, including the following: review of clinical lab tests;  review of medical tests/procedures/services.  I, Trixie Dredge, am acting as transcriptionist for Dennard Nip, MD.  I have reviewed the above documentation for accuracy and completeness, and I agree with the above. -  Dennard Nip, MD

## 2022-09-22 DIAGNOSIS — E7800 Pure hypercholesterolemia, unspecified: Secondary | ICD-10-CM | POA: Diagnosis not present

## 2022-09-22 DIAGNOSIS — K648 Other hemorrhoids: Secondary | ICD-10-CM | POA: Diagnosis not present

## 2022-09-22 DIAGNOSIS — K602 Anal fissure, unspecified: Secondary | ICD-10-CM | POA: Diagnosis not present

## 2022-09-24 ENCOUNTER — Telehealth: Payer: Self-pay | Admitting: Adult Health

## 2022-09-24 NOTE — Telephone Encounter (Signed)
HST- [employer] no auth req'd (spoke to Ciro Backer ref # [id number] & Medicaid no auth since its secondary)   Patient is scheduled at [facility] for 10/13/22 at 8:30 AM.  Mailed packet to the patient.

## 2022-09-29 ENCOUNTER — Encounter (INDEPENDENT_AMBULATORY_CARE_PROVIDER_SITE_OTHER): Payer: Self-pay | Admitting: Family Medicine

## 2022-09-29 ENCOUNTER — Ambulatory Visit (INDEPENDENT_AMBULATORY_CARE_PROVIDER_SITE_OTHER): Payer: Medicare Other | Admitting: Family Medicine

## 2022-09-29 VITALS — BP 114/74 | HR 72 | Temp 97.6°F | Ht 68.0 in | Wt 242.0 lb

## 2022-09-29 DIAGNOSIS — E669 Obesity, unspecified: Secondary | ICD-10-CM | POA: Diagnosis not present

## 2022-09-29 DIAGNOSIS — Z7985 Long-term (current) use of injectable non-insulin antidiabetic drugs: Secondary | ICD-10-CM

## 2022-09-29 DIAGNOSIS — E559 Vitamin D deficiency, unspecified: Secondary | ICD-10-CM | POA: Diagnosis not present

## 2022-09-29 DIAGNOSIS — Z5948 Other specified lack of adequate food: Secondary | ICD-10-CM

## 2022-09-29 DIAGNOSIS — E119 Type 2 diabetes mellitus without complications: Secondary | ICD-10-CM | POA: Diagnosis not present

## 2022-09-29 DIAGNOSIS — Z6836 Body mass index (BMI) 36.0-36.9, adult: Secondary | ICD-10-CM

## 2022-09-29 MED ORDER — VITAMIN D (ERGOCALCIFEROL) 1.25 MG (50000 UNIT) PO CAPS: 50000.0000 [IU] | ORAL_CAPSULE | ORAL | 0 refills | Status: DC

## 2022-09-30 NOTE — Progress Notes (Signed)
Chief Complaint:   OBESITY Brandon [name] is here to discuss his progress with his obesity treatment plan along with follow-up of his obesity related diagnoses. Brandon [name] is on practicing portion control and making smarter food choices, such as increasing vegetables and decreasing simple carbohydrates and states he is following his eating plan approximately 10% of the time. Brandon [name] states he is walking for 15 minutes 4 times per week.  Today's visit was #: 77 Starting weight: 305 lbs Starting date: 09/15/2017 Today's weight: 242 lbs Today's date: 09/29/2022 Total lbs lost to date: 63 Total lbs lost since last in-office visit: 17  Interim History: Brandon [name] has been looking a significant amount of weight loss. He has been unable to eat much due to some GI issues and possibly stress. He is trying to drink protein shakes.   Subjective:   1. Lack of food as cause of insufficient nourishment Brandon [name] has extremely high stress with 2 potentially life threatening conditions and he is being evaluated. He is not able to eat much and he is at high risk of malnourishment complications. He is working on eating small portions and drinking protein drinks to keep up his energy.   2. Type 2 diabetes mellitus without complication, unspecified whether long term insulin use (Brandon [name]) Brandon [name] has been off his Ozempic due to upcoming procedures. He is not eating much and losing weight, but it does not appear to be due to Ozempic.   3. Vitamin D deficiency Brandon [name] is stable on Vitamin D, and he requests a refill.  Assessment/Plan:   1. Lack of food as cause of insufficient nourishment Brandon [name] was offered support and encouragement. He will continue his protein supplements and we will work with him to improve his nutrition.   2. Type 2 diabetes mellitus without complication, unspecified whether long term insulin use (Brandon [name]) Brandon [name] will continue to hold Ozempic an will follow-up closely to see if and when he will  restart after he gets his official diagnoses.   3. Vitamin D deficiency Brandon [name] will continue prescription Vitamin D, and we will refill for 1 month.   - Vitamin D, Ergocalciferol, (DRISDOL) 1.25 MG (50000 UNIT) CAPS capsule; Take 1 capsule (50,000 Units total) by mouth every 7 (seven) days.  Dispense: 5 capsule; Refill: 0  4. BMI 36.0-36.9,adult  5. Obesity, Beginning BMI 46.38 Brandon [name] is currently in the action stage of change. As such, his goal is to continue with weight loss efforts. He has agreed to practicing portion control and making smarter food choices, such as increasing vegetables and decreasing simple carbohydrates.   Patient is to work on addressing his new health issues and setup his treatment plans, and then we will give nutrition advise based on his specific needs.   Exercise goals: As is.   Behavioral modification strategies: no skipping meals.  Brandon [name] has agreed to follow-up with our clinic in 3 weeks. He was informed of the importance of frequent follow-up visits to maximize his success with intensive lifestyle modifications for his multiple health conditions.   Objective:   Blood pressure 114/74, pulse 72, temperature 97.6 F (36.4 C), height 5\' 8"  (1.727 m), weight 242 lb (109.8 kg), SpO2 98 %. Body mass index is 36.8 kg/m.  Lab Results  Component Value Date   CREATININE 1.19 09/01/2022   BUN 20 09/01/2022   NA 144 09/01/2022   K 4.6 09/01/2022   CL 103 09/01/2022   CO2 25 09/01/2022   Lab Results  Component Value Date   ALT  45 (H) 09/01/2022   AST 21 09/01/2022   ALKPHOS 56 09/01/2022   BILITOT 0.3 09/01/2022   Lab Results  Component Value Date   HGBA1C 5.9 (H) 09/01/2022   HGBA1C 5.5 04/30/2022   HGBA1C 5.8 (H) 01/21/2022   HGBA1C 6.7 (H) 08/12/2021   HGBA1C 7.7 (H) 06/04/2021   Lab Results  Component Value Date   INSULIN 67.1 (H) 09/01/2022   INSULIN 46.8 (H) 04/30/2022   INSULIN 143.0 (H) 01/21/2022   INSULIN 266.0 (H) 08/12/2021    INSULIN 126.0 (H) 06/04/2021   Lab Results  Component Value Date   TSH 2.310 09/01/2022   Lab Results  Component Value Date   CHOL 116 09/01/2022   HDL 35 (L) 09/01/2022   LDLCALC 48 09/01/2022   TRIG 203 (H) 09/01/2022   CHOLHDL 3.4 08/24/2017   Lab Results  Component Value Date   VD25OH 49.0 09/01/2022   VD25OH 53.7 04/30/2022   VD25OH 68.1 01/21/2022   Lab Results  Component Value Date   WBC 4.9 09/01/2022   HGB 14.1 09/01/2022   HCT 43.4 09/01/2022   MCV 85 09/01/2022   PLT 152 09/01/2022   No results found for: "IRON", "TIBC", "FERRITIN"  Attestation Statements:   Reviewed by clinician on day of visit: allergies, medications, problem list, medical history, surgical history, family history, social history, and previous encounter notes.  Time spent on visit including pre-visit chart review and post-visit care and charting was 50 minutes.   I, Trixie Dredge, am acting as transcriptionist for Dennard Nip, MD.  I have reviewed the above documentation for accuracy and completeness, and I agree with the above. -  Dennard Nip, MD

## 2022-10-02 ENCOUNTER — Encounter (INDEPENDENT_AMBULATORY_CARE_PROVIDER_SITE_OTHER): Payer: Self-pay | Admitting: Family Medicine

## 2022-10-12 ENCOUNTER — Other Ambulatory Visit (INDEPENDENT_AMBULATORY_CARE_PROVIDER_SITE_OTHER): Payer: Self-pay | Admitting: Family Medicine

## 2022-10-12 DIAGNOSIS — E559 Vitamin D deficiency, unspecified: Secondary | ICD-10-CM

## 2022-10-13 ENCOUNTER — Ambulatory Visit: Payer: 59 | Admitting: Neurology

## 2022-10-13 DIAGNOSIS — F958 Other tic disorders: Secondary | ICD-10-CM

## 2022-10-13 DIAGNOSIS — G4733 Obstructive sleep apnea (adult) (pediatric): Secondary | ICD-10-CM | POA: Diagnosis not present

## 2022-10-14 NOTE — Progress Notes (Signed)
[facility]   HOME SLEEP TEST REPORT ( by Watch PAT)    Brandon [name] "[name]" 52 year old male Sep 16, 1970 STUDY DATE: 10-14-2022   MRN:  [id number]    ORDERING CLINICIAN: Butch Penny, NP  REFERRING CLINICIAN:  General neurologist : Claudette Head    CLINICAL INFORMATION/HISTORY:MM, today 07/15/22:  "[name] is a 52 year old male with a history of obstructive sleep apnea on CPAP.  He returns today for follow-up.  He reports that the CPAP is working well.  Denies any new issues. Would like a new machine".   ADEM, Syncope, limb weakness, ADHD, Morbid obesity- seen here with OSA on CPAP, the only condition for which he has been followed at [facility].  Machine was last set up in 2018 through [place].  I could not find any sleep study documentation for the last 12 years at [facility] in [place]. Quoted a sleep study from 12-26-2009,  he was seen as a new sleep patient referred at the time and already on CPAP:  "[name] is a 52 y.o. male  Is seen here as a referral  from [place] [place] [facility] for persistent Hypersomnia while compliant with CPAP -and taking Nuvigil .  His primary neurologist is Dr Marjory Lies, who initially took his are over under the Dx of MS ( suspected diagnosis per Dr Orlin Hilding) , and after consultation with Dr. Orie Rout, [facility] has now seen him for  a diagnosis of ADEM".    Auto set CPAP 5-15 cm water, 3 cm expiratory relief and residual AHI of 0.9, 0 95% pressure is 12.8 cm water.    Epworth sleepiness score: 4 /24. FSS : na   BMI: 39  kg/m   Neck Circumference: n/a    FINDINGS:   Sleep Summary:   Total Recording Time (hours, min):    9 h and 47 m    Total Sleep Time (hours, min):   7 h and 18 m              Percent REM (%):   28.4%                                     Respiratory Indices:   Calculated pAHI (per hour):  15/h                           REM pAHI:    23.1/h                                             NREM pAHI:     11.9/h                          Positional AHI:   supine 18.8/h and non supine 6.8/h   No central events                                                 Oxygen Saturation Statistics:  O2 Saturation Range (%): between 88 and 99% with a mean at 94%.  O2 Saturation (minutes) <89%:   0 minutes     Pulse Rate Statistics:   Pulse Mean (bpm):  78 bpm               Pulse Range:  between 56 and 112 bpm               IMPRESSION:  This HST confirms the presence of mild sleep apnea which is REM sleep dependent and supine positional dependent.      RECOMMENDATION: I recommend to continue PAP therapy, but like to congratulate the patient on weight loss.  Part of a successful treatment towards further reduction in apnea and hypopnea is to avoid sleeping supine and continue to reduce weight for a goal BMI under 32 kg/m2.   I will order a new CPAP auto-titration device for this patient. Settings from  6-17 cm water, 3 cm EPR and heated humidification.  I could not find documentation of his interface type, maker or size.     INTERPRETING PHYSICIAN:   Melvyn Novas, MD   Medical Director of [facility] at [facility].

## 2022-10-16 ENCOUNTER — Encounter: Payer: Self-pay | Admitting: Adult Health

## 2022-10-16 NOTE — Procedures (Signed)
[facility]   HOME SLEEP TEST REPORT ( by Watch PAT)     Dahlia Byes "[name]" 52 year old male 29-Oct-1970 STUDY DATE: 10-14-2022   MRN:  [id number]    ORDERING CLINICIAN: Butch Penny, NP  REFERRING CLINICIAN:  General neurologist : Claudette Head    CLINICAL INFORMATION/HISTORY:MM, today 07/15/22:   "[name] is a 52 year old male with a history of obstructive sleep apnea on CPAP.  He returns today for follow-up.  He reports that the CPAP is working well.  Denies any new issues. Would like a new machine".    ADEM, Syncope, limb weakness, ADHD, Morbid obesity- seen here with OSA on CPAP, the only condition for which he has been followed at [facility].  Machine was last set up in 2018 through [place].  I could not find any sleep study documentation for the last 12 years at [facility] in [place]. Quoted a sleep study from 12-26-2009,  he was seen as a new sleep patient referred at the time and already on CPAP:  "JOSSIEL GOTTHARDT is a 52 y.o. male  Is seen here as a referral  from [place] [place] for persistent Hypersomnia while compliant with CPAP -and taking Nuvigil .  His primary neurologist is Dr Marjory Lies, who initially took his are over under the Dx of MS ( suspected diagnosis per Dr Orlin Hilding) , and after consultation with Dr. Orie Rout, [facility]., has now seen him for  a diagnosis of ADEM".      Auto set CPAP 5-15 cm water, 3 cm expiratory relief and residual AHI of 0.9, 0 95% pressure is 12.8 cm water.      Epworth sleepiness score: 4 /24. FSS : na   BMI: 39  kg/m   Neck Circumference: n/a    FINDINGS:   Sleep Summary:   Total Recording Time (hours, min):    9 h and 47 m    Total Sleep Time (hours, min):   7 h and 18 m              Percent REM (%):   28.4%                                     Respiratory Indices:   Calculated pAHI (per hour):  15/h                           REM pAHI:    23.1/h                                             NREM pAHI:     11.9/h                          Positional AHI:   supine 18.8/h and non supine 6.8/h    No central events                                                 Oxygen Saturation Statistics:   O2 Saturation Range (%): between 88 and 99%  O2 Saturation (minutes) <89%:   0 minutes     Pulse Rate Statistics:   Pulse Mean (bpm):  78 bpm               Pulse Range:  between 56 and 112 bpm               IMPRESSION:  This HST confirms the presence of mild sleep apnea which is REM sleep dependent and supine positional dependent.      RECOMMENDATION: I recommend to continue PAP therapy, but like to congratulate the patient on weight loss.  Part of a successful treatment towards further reduction in apnea and hypopnea is to avoid sleeping supine and continue to reduce weight for a goal BMI under 32 kg/m2.   I will order a new CPAP auto-titration device for this patient. Settings from  6-17 cm water, 3 cm EPR and heated humidification.  I could not find documentation of his interface type, maker or size.     INTERPRETING PHYSICIAN:   Leticia Mcdiarmid, MD   Medical Director of [facility] at [facility].                         

## 2022-10-21 ENCOUNTER — Encounter (INDEPENDENT_AMBULATORY_CARE_PROVIDER_SITE_OTHER): Payer: Self-pay | Admitting: Family Medicine

## 2022-10-21 ENCOUNTER — Ambulatory Visit (INDEPENDENT_AMBULATORY_CARE_PROVIDER_SITE_OTHER): Payer: 59 | Admitting: Family Medicine

## 2022-10-21 VITALS — BP 122/64 | HR 87 | Temp 98.1°F | Ht 68.0 in | Wt 247.0 lb

## 2022-10-21 DIAGNOSIS — Z6837 Body mass index (BMI) 37.0-37.9, adult: Secondary | ICD-10-CM

## 2022-10-21 DIAGNOSIS — E559 Vitamin D deficiency, unspecified: Secondary | ICD-10-CM

## 2022-10-21 DIAGNOSIS — E669 Obesity, unspecified: Secondary | ICD-10-CM | POA: Diagnosis not present

## 2022-10-21 DIAGNOSIS — F418 Other specified anxiety disorders: Secondary | ICD-10-CM

## 2022-10-21 MED ORDER — VITAMIN D (ERGOCALCIFEROL) 1.25 MG (50000 UNIT) PO CAPS: 50000.0000 [IU] | ORAL_CAPSULE | ORAL | 0 refills | Status: DC

## 2022-10-23 ENCOUNTER — Other Ambulatory Visit: Payer: Self-pay

## 2022-10-26 NOTE — Progress Notes (Unsigned)
Chief Complaint:   OBESITY Brandon [name] is here to discuss his progress with his obesity treatment plan along with follow-up of his obesity related diagnoses. Brandon [name] is on practicing portion control and making smarter food choices, such as increasing vegetables and decreasing simple carbohydrates and states he is following his eating plan approximately 35% of the time. Brandon [name] states he is at the gym and walking for 10-40 minutes 5 times per week.  Today's visit was #: 66 Starting weight: 305 lbs Starting date: 09/15/2017 Today's weight: 247 lbs Today's date: 10/21/2022 Total lbs lost to date: 58 Total lbs lost since last in-office visit: 0  Interim History: Brandon [name] has been dealing with a lot of health and family issues. Much of our visit today was discussing these issues, which makes following his eating plan more difficult. He is working on getting back on track and would like to lose another 30 lbs.   Subjective:   1. Vitamin D deficiency Brandon [name] is on Vitamin D, and his recent level was at goal.   2. Depression with anxiety Brandon [name] has been dealing with health challenges and relationship challenges, which is very distressing to him. He is working on dealing with these issues in a healthy way. He denies suicidal or homicidal ideations.   Assessment/Plan:   1. Vitamin D deficiency Brandon [name] will continue prescription Vitamin D, and we will refill for 1 month.   - Vitamin D, Ergocalciferol, (DRISDOL) 1.25 MG (50000 UNIT) CAPS capsule; Take 1 capsule (50,000 Units total) by mouth every 7 (seven) days.  Dispense: 5 capsule; Refill: 0  2. Depression with anxiety Brandon [name] was offered support and encouragement, and we will continue to monitor his mood.  He may need to find a better match for a therapist.  3. BMI 37.0-37.9, adult  4. Obesity, Beginning BMI 46.38 Brandon [name] is currently in the action stage of change. As such, his goal is to continue with weight loss efforts. He has agreed  to the Category 4 Plan + 100 calories (strict).   Exercise goals: As is.   Behavioral modification strategies: increasing lean protein intake.  Brandon [name] has agreed to follow-up with our clinic in 4 weeks. He was informed of the importance of frequent follow-up visits to maximize his success with intensive lifestyle modifications for his multiple health conditions.   Objective:   Blood pressure 122/64, pulse 87, temperature 98.1 F (36.7 C), height  (1.727 m), weight 247 lb (112 kg), SpO2 98 %. Body mass index is 37.56 kg/m.  Lab Results  Component Value Date   CREATININE 1.19 09/01/2022   BUN 20 09/01/2022   NA 144 09/01/2022   K 4.6 09/01/2022   CL 103 09/01/2022   CO2 25 09/01/2022   Lab Results  Component Value Date   ALT 45 (H) 09/01/2022   AST 21 09/01/2022   ALKPHOS 56 09/01/2022   BILITOT 0.3 09/01/2022   Lab Results  Component Value Date   HGBA1C 5.9 (H) 09/01/2022   HGBA1C 5.5 04/30/2022   HGBA1C 5.8 (H) 01/21/2022   HGBA1C 6.7 (H) 08/12/2021   HGBA1C 7.7 (H) 06/04/2021   Lab Results  Component Value Date   INSULIN 67.1 (H) 09/01/2022   INSULIN 46.8 (H) 04/30/2022   INSULIN 143.0 (H) 01/21/2022   INSULIN 266.0 (H) 08/12/2021   INSULIN 126.0 (H) 06/04/2021   Lab Results  Component Value Date   TSH 2.310 09/01/2022   Lab Results  Component Value Date   CHOL 116 09/01/2022  HDL 35 (L) 09/01/2022   LDLCALC 48 09/01/2022   TRIG 203 (H) 09/01/2022   CHOLHDL 3.4 08/24/2017   Lab Results  Component Value Date   VD25OH 49.0 09/01/2022   VD25OH 53.7 04/30/2022   VD25OH 68.1 01/21/2022   Lab Results  Component Value Date   WBC 4.9 09/01/2022   HGB 14.1 09/01/2022   HCT 43.4 09/01/2022   MCV 85 09/01/2022   PLT 152 09/01/2022   No results found for: "IRON", "TIBC", "FERRITIN"  Attestation Statements:   Reviewed by clinician on day of visit: allergies, medications, problem list, medical history, surgical history, family history, social  history, and previous encounter notes.  Time spent on visit including pre-visit chart review and post-visit care and charting was 40 minutes.   I, Burt Knack, am acting as transcriptionist for Quillian Quince, MD.  I have reviewed the above documentation for accuracy and completeness, and I agree with the above. -  Quillian Quince, MD

## 2022-10-27 NOTE — Telephone Encounter (Signed)
Order sent to [facility] °

## 2022-11-09 NOTE — Telephone Encounter (Signed)
Received confirmation of receipt of order from [facility].

## 2022-11-17 DIAGNOSIS — R55 Syncope and collapse: Secondary | ICD-10-CM | POA: Diagnosis not present

## 2022-11-25 ENCOUNTER — Encounter (INDEPENDENT_AMBULATORY_CARE_PROVIDER_SITE_OTHER): Payer: Self-pay | Admitting: Family Medicine

## 2022-11-25 ENCOUNTER — Ambulatory Visit (INDEPENDENT_AMBULATORY_CARE_PROVIDER_SITE_OTHER): Payer: 59 | Admitting: Family Medicine

## 2022-11-25 VITALS — BP 94/60 | HR 80 | Temp 97.7°F | Ht 68.0 in | Wt 240.0 lb

## 2022-11-25 DIAGNOSIS — Z6836 Body mass index (BMI) 36.0-36.9, adult: Secondary | ICD-10-CM

## 2022-11-25 DIAGNOSIS — G4733 Obstructive sleep apnea (adult) (pediatric): Secondary | ICD-10-CM

## 2022-11-25 DIAGNOSIS — F439 Reaction to severe stress, unspecified: Secondary | ICD-10-CM

## 2022-11-25 DIAGNOSIS — E119 Type 2 diabetes mellitus without complications: Secondary | ICD-10-CM

## 2022-11-25 DIAGNOSIS — E669 Obesity, unspecified: Secondary | ICD-10-CM

## 2022-11-25 DIAGNOSIS — E559 Vitamin D deficiency, unspecified: Secondary | ICD-10-CM

## 2022-11-25 DIAGNOSIS — Z7985 Long-term (current) use of injectable non-insulin antidiabetic drugs: Secondary | ICD-10-CM

## 2022-11-25 MED ORDER — SEMAGLUTIDE (1 MG/DOSE) 4 MG/3ML ~~LOC~~ SOPN: 1.0000 mg | PEN_INJECTOR | SUBCUTANEOUS | 0 refills | Status: DC

## 2022-11-25 MED ORDER — VITAMIN D (ERGOCALCIFEROL) 1.25 MG (50000 UNIT) PO CAPS: 50000.0000 [IU] | ORAL_CAPSULE | ORAL | 0 refills | Status: DC

## 2022-12-01 NOTE — Progress Notes (Signed)
Chief Complaint:   OBESITY Brandon [name] is here to discuss his progress with his obesity treatment plan along with follow-up of his obesity related diagnoses. Brandon [name] is on the Category 4 Plan + 100 calories and states he is following his eating plan approximately 50% of the time. Brandon [name] states he is walking for 10 minutes and at the gym for 40 minutes 5 times per week.  Today's visit was #: 67 Starting weight: 305 lbs Starting date: 09/15/2017 Today's weight: 240 lbs Today's date: 11/25/2022 Total lbs lost to date: 65 Total lbs lost since last in-office visit: 7  Interim History: Patient continues to do well with his weight loss.  He is going on vacation soon, but he is not worried about weight gain.  He is noticing some taste disturbances especially with cold cuts.  Subjective:   1. OSA (obstructive sleep apnea) Patient had a sleep study repeated.  He needs a new CPAP machine and an autorotation machine was ordered.  He continues to do well with his weight loss.  2. Vitamin D deficiency Patient is on vitamin D, and he requests a refill today.  He denies nausea, vomiting, or muscle weakness.  3. Stress Patient is dealing with increased family stress.  He is in the process of gender transitioning.  He has a lot to tell me about his current stressors.  4. Type 2 diabetes mellitus without complication, without long-term current use of insulin (HCC) Patient is stable on Ozempic.  He notes decreased polyphagia and he feels it is easier to follow his eating plan.  He denies nausea or vomiting.  Assessment/Plan:   1. OSA (obstructive sleep apnea) Patient is to get his new CPAP machine as soon as possible.  2. Vitamin D deficiency Patient will continue prescription vitamin D, and we will refill for 1 month.  - Vitamin D, Ergocalciferol, (DRISDOL) 1.25 MG (50000 UNIT) CAPS capsule; Take 1 capsule (50,000 Units total) by mouth every 7 (seven) days.  Dispense: 5 capsule; Refill: 0  3.  Stress Patient was offered support and encouragement.  We will continue to monitor his emotional eating state closely.  4. Type 2 diabetes mellitus without complication, without long-term current use of insulin (HCC) Patient will continue Ozempic at 1 mg once weekly, and we will refill for 1 month.  - Semaglutide, 1 MG/DOSE, 4 MG/3ML SOPN; Inject 1 mg as directed once a week for 28 days.  Dispense: 3 mL; Refill: 0  5. BMI 36.0-36.9,adult  6. Obesity, Beginning BMI 46.38 Brandon [name] is currently in the action stage of change. As such, his goal is to continue with weight loss efforts. He has agreed to practicing portion control and making smarter food choices, such as increasing vegetables and decreasing simple carbohydrates.   Exercise goals: As is.   Behavioral modification strategies: increasing lean protein intake and no skipping meals.  Brandon [name] has agreed to follow-up with our clinic in 4 weeks. He was informed of the importance of frequent follow-up visits to maximize his success with intensive lifestyle modifications for his multiple health conditions.   Objective:   Blood pressure 94/60, pulse 80, temperature 97.7 F (36.5 C), height 5\' 8"  (1.727 m), weight 240 lb (108.9 kg), SpO2 98 %. Body mass index is 36.49 kg/m.  Lab Results  Component Value Date   CREATININE 1.19 09/01/2022   BUN 20 09/01/2022   NA 144 09/01/2022   K 4.6 09/01/2022   CL 103 09/01/2022   CO2 25 09/01/2022  Lab Results  Component Value Date   ALT 45 (H) 09/01/2022   AST 21 09/01/2022   ALKPHOS 56 09/01/2022   BILITOT 0.3 09/01/2022   Lab Results  Component Value Date   HGBA1C 5.9 (H) 09/01/2022   HGBA1C 5.5 04/30/2022   HGBA1C 5.8 (H) 01/21/2022   HGBA1C 6.7 (H) 08/12/2021   HGBA1C 7.7 (H) 06/04/2021   Lab Results  Component Value Date   INSULIN 67.1 (H) 09/01/2022   INSULIN 46.8 (H) 04/30/2022   INSULIN 143.0 (H) 01/21/2022   INSULIN 266.0 (H) 08/12/2021   INSULIN 126.0 (H) 06/04/2021    Lab Results  Component Value Date   TSH 2.310 09/01/2022   Lab Results  Component Value Date   CHOL 116 09/01/2022   HDL 35 (L) 09/01/2022   LDLCALC 48 09/01/2022   TRIG 203 (H) 09/01/2022   CHOLHDL 3.4 08/24/2017   Lab Results  Component Value Date   VD25OH 49.0 09/01/2022   VD25OH 53.7 04/30/2022   VD25OH 68.1 01/21/2022   Lab Results  Component Value Date   WBC 4.9 09/01/2022   HGB 14.1 09/01/2022   HCT 43.4 09/01/2022   MCV 85 09/01/2022   PLT 152 09/01/2022   No results found for: "IRON", "TIBC", "FERRITIN"  Attestation Statements:   Reviewed by clinician on day of visit: allergies, medications, problem list, medical history, surgical history, family history, social history, and previous encounter notes.  Time spent on visit including pre-visit chart review and post-visit care and charting was 42 minutes.   I, Burt Knack, am acting as transcriptionist for Quillian Quince, MD.  I have reviewed the above documentation for accuracy and completeness, and I agree with the above. -  Quillian Quince, MD

## 2022-12-16 ENCOUNTER — Other Ambulatory Visit (INDEPENDENT_AMBULATORY_CARE_PROVIDER_SITE_OTHER): Payer: Self-pay | Admitting: Family Medicine

## 2022-12-16 DIAGNOSIS — E559 Vitamin D deficiency, unspecified: Secondary | ICD-10-CM

## 2022-12-23 ENCOUNTER — Encounter (INDEPENDENT_AMBULATORY_CARE_PROVIDER_SITE_OTHER): Payer: Self-pay | Admitting: Family Medicine

## 2022-12-23 ENCOUNTER — Ambulatory Visit (INDEPENDENT_AMBULATORY_CARE_PROVIDER_SITE_OTHER): Payer: 59 | Admitting: Family Medicine

## 2022-12-23 VITALS — BP 98/62 | HR 99 | Temp 98.1°F | Ht 68.0 in | Wt 244.0 lb

## 2022-12-23 DIAGNOSIS — E559 Vitamin D deficiency, unspecified: Secondary | ICD-10-CM | POA: Diagnosis not present

## 2022-12-23 DIAGNOSIS — F439 Reaction to severe stress, unspecified: Secondary | ICD-10-CM

## 2022-12-23 DIAGNOSIS — E669 Obesity, unspecified: Secondary | ICD-10-CM | POA: Diagnosis not present

## 2022-12-23 DIAGNOSIS — Z6837 Body mass index (BMI) 37.0-37.9, adult: Secondary | ICD-10-CM

## 2022-12-23 DIAGNOSIS — E119 Type 2 diabetes mellitus without complications: Secondary | ICD-10-CM | POA: Diagnosis not present

## 2022-12-23 DIAGNOSIS — Z7985 Long-term (current) use of injectable non-insulin antidiabetic drugs: Secondary | ICD-10-CM

## 2022-12-24 MED ORDER — SEMAGLUTIDE (1 MG/DOSE) 4 MG/3ML ~~LOC~~ SOPN
1.0000 mg | PEN_INJECTOR | SUBCUTANEOUS | 0 refills | Status: DC
Start: 2022-12-24 — End: 2023-01-27

## 2022-12-24 MED ORDER — VITAMIN D (ERGOCALCIFEROL) 1.25 MG (50000 UNIT) PO CAPS: 50000.0000 [IU] | ORAL_CAPSULE | ORAL | 0 refills | Status: DC

## 2022-12-29 NOTE — Progress Notes (Signed)
Chief Complaint:   OBESITY Brandon [name] is here to discuss his progress with his obesity treatment plan along with follow-up of his obesity related diagnoses. Brandon [name] is on practicing portion control and making smarter food choices, such as increasing vegetables and decreasing simple carbohydrates and states he is following his eating plan approximately 60% of the time. Brandon [name] states he is walking for 10 minutes 5 times per week.  Today's visit was #: 68 Starting weight: 305 lbs Starting date: 09/15/2017 Today's weight: 244 lbs Today's date: 12/23/2022 Total lbs lost to date: 61 Total lbs lost since last in-office visit: 0  Interim History: Patient has struggled with meeting his protein goals.  He has also increased snacking.  He is working on getting back on track.  Subjective:   1. Stress Patient continues to have significant family conflict.  He continues to work on being the [employer].  2. Vitamin D deficiency Patient is on vitamin D prescription, and his vitamin D level is almost at goal.  3. Type 2 diabetes mellitus without complication, without long-term current use of insulin (HCC) Patient is on Ozempic, and he is working on his diet but he has been struggling more in the last month.  Assessment/Plan:   1. Stress Patient is to continue to work on self-care and work on decreasing emotional eating behavior.  We will continue to follow closely.  2. Vitamin D deficiency Patient will continue prescription vitamin D once weekly, and we will refill for 1 month.  - Vitamin D, Ergocalciferol, (DRISDOL) 1.25 MG (50000 UNIT) CAPS capsule; Take 1 capsule (50,000 Units total) by mouth every 7 (seven) days.  Dispense: 5 capsule; Refill: 0  3. Type 2 diabetes mellitus without complication, without long-term current use of insulin (HCC) Patient will continue Ozempic 1 mg, and we will refill for 1 month.  - Semaglutide, 1 MG/DOSE, 4 MG/3ML SOPN; Inject 1 mg as directed once a week  for 28 days.  Dispense: 3 mL; Refill: 0  4. BMI 37.0-37.9, adult  5. Obesity, Beginning BMI 46.38 Brandon [name] is currently in the action stage of change. As such, his goal is to continue with weight loss efforts. He has agreed to practicing portion control and making smarter food choices, such as increasing vegetables and decreasing simple carbohydrates.   Exercise goals: As is.  Behavioral modification strategies: increasing lean protein intake.  Brandon [name] has agreed to follow-up with our clinic in 4 weeks. He was informed of the importance of frequent follow-up visits to maximize his success with intensive lifestyle modifications for his multiple health conditions.   Objective:   Blood pressure 98/62, pulse 99, temperature 98.1 F (36.7 C), height 5\' 8"  (1.727 m), weight 244 lb (110.7 kg), SpO2 96 %. Body mass index is 37.1 kg/m.  Lab Results  Component Value Date   CREATININE 1.19 09/01/2022   BUN 20 09/01/2022   NA 144 09/01/2022   K 4.6 09/01/2022   CL 103 09/01/2022   CO2 25 09/01/2022   Lab Results  Component Value Date   ALT 45 (H) 09/01/2022   AST 21 09/01/2022   ALKPHOS 56 09/01/2022   BILITOT 0.3 09/01/2022   Lab Results  Component Value Date   HGBA1C 5.9 (H) 09/01/2022   HGBA1C 5.5 04/30/2022   HGBA1C 5.8 (H) 01/21/2022   HGBA1C 6.7 (H) 08/12/2021   HGBA1C 7.7 (H) 06/04/2021   Lab Results  Component Value Date   INSULIN 67.1 (H) 09/01/2022   INSULIN 46.8 (H) 04/30/2022  INSULIN 143.0 (H) 01/21/2022   INSULIN 266.0 (H) 08/12/2021   INSULIN 126.0 (H) 06/04/2021   Lab Results  Component Value Date   TSH 2.310 09/01/2022   Lab Results  Component Value Date   CHOL 116 09/01/2022   HDL 35 (L) 09/01/2022   LDLCALC 48 09/01/2022   TRIG 203 (H) 09/01/2022   CHOLHDL 3.4 08/24/2017   Lab Results  Component Value Date   VD25OH 49.0 09/01/2022   VD25OH 53.7 04/30/2022   VD25OH 68.1 01/21/2022   Lab Results  Component Value Date   WBC 4.9 09/01/2022    HGB 14.1 09/01/2022   HCT 43.4 09/01/2022   MCV 85 09/01/2022   PLT 152 09/01/2022   No results found for: "IRON", "TIBC", "FERRITIN"  Attestation Statements:   Reviewed by clinician on day of visit: allergies, medications, problem list, medical history, surgical history, family history, social history, and previous encounter notes.  I have personally spent 43 minutes total time today in preparation, patient care, and documentation for this visit, including the following: review of clinical lab tests; review of medical tests/procedures/services.  I, Burt Knack, am acting as transcriptionist for Quillian Quince, MD.  I have reviewed the above documentation for accuracy and completeness, and I agree with the above. -  Quillian Quince, MD

## 2023-01-13 ENCOUNTER — Other Ambulatory Visit (INDEPENDENT_AMBULATORY_CARE_PROVIDER_SITE_OTHER): Payer: Self-pay | Admitting: Family Medicine

## 2023-01-13 DIAGNOSIS — E559 Vitamin D deficiency, unspecified: Secondary | ICD-10-CM

## 2023-01-27 ENCOUNTER — Encounter (INDEPENDENT_AMBULATORY_CARE_PROVIDER_SITE_OTHER): Payer: Self-pay | Admitting: Family Medicine

## 2023-01-27 ENCOUNTER — Ambulatory Visit (INDEPENDENT_AMBULATORY_CARE_PROVIDER_SITE_OTHER): Payer: 59 | Admitting: Family Medicine

## 2023-01-27 VITALS — BP 111/67 | HR 77 | Temp 98.2°F | Ht 68.0 in | Wt 248.0 lb

## 2023-01-27 DIAGNOSIS — E669 Obesity, unspecified: Secondary | ICD-10-CM | POA: Diagnosis not present

## 2023-01-27 DIAGNOSIS — E119 Type 2 diabetes mellitus without complications: Secondary | ICD-10-CM

## 2023-01-27 DIAGNOSIS — Z7985 Long-term (current) use of injectable non-insulin antidiabetic drugs: Secondary | ICD-10-CM

## 2023-01-27 DIAGNOSIS — E559 Vitamin D deficiency, unspecified: Secondary | ICD-10-CM

## 2023-01-27 DIAGNOSIS — F439 Reaction to severe stress, unspecified: Secondary | ICD-10-CM

## 2023-01-27 DIAGNOSIS — Z6837 Body mass index (BMI) 37.0-37.9, adult: Secondary | ICD-10-CM

## 2023-01-27 MED ORDER — SEMAGLUTIDE (1 MG/DOSE) 4 MG/3ML ~~LOC~~ SOPN
1.0000 mg | PEN_INJECTOR | SUBCUTANEOUS | 0 refills | Status: DC
Start: 2023-01-27 — End: 2023-02-23

## 2023-01-27 MED ORDER — VITAMIN D (ERGOCALCIFEROL) 1.25 MG (50000 UNIT) PO CAPS: 50000.0000 [IU] | ORAL_CAPSULE | ORAL | 0 refills | Status: DC

## 2023-02-01 NOTE — Progress Notes (Signed)
Chief Complaint:   OBESITY Brandon is here to discuss his progress with his obesity treatment plan along with follow-up of his obesity related diagnoses. Ashan is on the Category 4 Plan and states he is following his eating plan approximately 50% of the time. Barri states he is walking for 10 minutes 4 times per week.  Today's visit was #: 69 Starting weight: 305 lbs Starting date: 09/15/2017 Today's weight: 248 lbs Today's date: 01/27/2023 Total lbs lost to date: 57 Total lbs lost since last in-office visit: 0  Interim History: Patient is working on his weight loss.  He is struggling with meeting his protein goals.  He has been working on increasing hydration.  He has done more traveling, which always makes healthy eating more difficult.  Subjective:   1. Stress Patient has ongoing relationship struggles.  He has been working on reducing his stress, but it has been challenging.  This took up a significant portion of his visit today.  2. Vitamin D deficiency Patient's last vitamin D level was close to goal.  He denies nausea, vomiting, or muscle weakness.  3. Type 2 diabetes mellitus without complication, without long-term current use of insulin (HCC) Patient continues to work on his diet.  His recent A1c was 5.9 and well-controlled.  Assessment/Plan:   1. Stress Patient will continue to work on decreasing stress.  We will continue to follow.  2. Vitamin D deficiency Patient will continue prescription vitamin D once weekly, and we will refill for 1 month.  - Vitamin D, Ergocalciferol, (DRISDOL) 1.25 MG (50000 UNIT) CAPS capsule; Take 1 capsule (50,000 Units total) by mouth every 7 (seven) days.  Dispense: 5 capsule; Refill: 0  3. Type 2 diabetes mellitus without complication, without long-term current use of insulin (HCC) Patient will continue Ozempic 1 mg weekly, and we will refill for 1 month.  He will continue to work on his diet and weight loss.  - Semaglutide, 1  MG/DOSE, 4 MG/3ML SOPN; Inject 1 mg as directed once a week for 28 days.  Dispense: 3 mL; Refill: 0  4. BMI 37.0-37.9, adult  5. Obesity, Beginning BMI 46.38 Clim is currently in the action stage of change. As such, his goal is to continue with weight loss efforts. He has agreed to the Category 4 Plan.   Exercise goals: All adults should avoid inactivity. Some physical activity is better than none, and adults who participate in any amount of physical activity gain some health benefits.  Behavioral modification strategies: increasing lean protein intake.  [name] has agreed to follow-up with our clinic in 4 weeks. He was informed of the importance of frequent follow-up visits to maximize his success with intensive lifestyle modifications for his multiple health conditions.   Objective:   Blood pressure 111/67, pulse 77, temperature 98.2 F (36.8 C), height 5\' 8"  (1.727 m), weight 248 lb (112.5 kg), SpO2 97%. Body mass index is 37.71 kg/m.  Lab Results  Component Value Date   CREATININE 1.19 09/01/2022   BUN 20 09/01/2022   NA 144 09/01/2022   K 4.6 09/01/2022   CL 103 09/01/2022   CO2 25 09/01/2022   Lab Results  Component Value Date   ALT 45 (H) 09/01/2022   AST 21 09/01/2022   ALKPHOS 56 09/01/2022   BILITOT 0.3 09/01/2022   Lab Results  Component Value Date   HGBA1C 5.9 (H) 09/01/2022   HGBA1C 5.5 04/30/2022   HGBA1C 5.8 (H) 01/21/2022   HGBA1C 6.7 (H)  08/12/2021   HGBA1C 7.7 (H) 06/04/2021   Lab Results  Component Value Date   INSULIN 67.1 (H) 09/01/2022   INSULIN 46.8 (H) 04/30/2022   INSULIN 143.0 (H) 01/21/2022   INSULIN 266.0 (H) 08/12/2021   INSULIN 126.0 (H) 06/04/2021   Lab Results  Component Value Date   TSH 2.310 09/01/2022   Lab Results  Component Value Date   CHOL 116 09/01/2022   HDL 35 (L) 09/01/2022   LDLCALC 48 09/01/2022   TRIG 203 (H) 09/01/2022   CHOLHDL 3.4 08/24/2017   Lab Results  Component Value Date   VD25OH 49.0  09/01/2022   VD25OH 53.7 04/30/2022   VD25OH 68.1 01/21/2022   Lab Results  Component Value Date   WBC 4.9 09/01/2022   HGB 14.1 09/01/2022   HCT 43.4 09/01/2022   MCV 85 09/01/2022   PLT 152 09/01/2022   No results found for: "IRON", "TIBC", "FERRITIN"  Attestation Statements:   Reviewed by clinician on day of visit: allergies, medications, problem list, medical history, surgical history, family history, social history, and previous encounter notes.  Time spent on visit including pre-visit chart review and post-visit care and charting was 45 minutes.   I, Burt Knack, am acting as transcriptionist for Quillian Quince, MD.  I have reviewed the above documentation for accuracy and completeness, and I agree with the above. -  Quillian Quince, MD

## 2023-02-18 ENCOUNTER — Other Ambulatory Visit (INDEPENDENT_AMBULATORY_CARE_PROVIDER_SITE_OTHER): Payer: Self-pay | Admitting: Family Medicine

## 2023-02-18 DIAGNOSIS — E559 Vitamin D deficiency, unspecified: Secondary | ICD-10-CM

## 2023-02-23 ENCOUNTER — Ambulatory Visit (INDEPENDENT_AMBULATORY_CARE_PROVIDER_SITE_OTHER): Payer: 59 | Admitting: Family Medicine

## 2023-02-23 ENCOUNTER — Encounter (INDEPENDENT_AMBULATORY_CARE_PROVIDER_SITE_OTHER): Payer: Self-pay | Admitting: Family Medicine

## 2023-02-23 VITALS — BP 112/68 | HR 78 | Temp 97.9°F | Ht 68.0 in | Wt 244.0 lb

## 2023-02-23 DIAGNOSIS — Z6837 Body mass index (BMI) 37.0-37.9, adult: Secondary | ICD-10-CM | POA: Diagnosis not present

## 2023-02-23 DIAGNOSIS — E669 Obesity, unspecified: Secondary | ICD-10-CM | POA: Diagnosis not present

## 2023-02-23 DIAGNOSIS — Z7985 Long-term (current) use of injectable non-insulin antidiabetic drugs: Secondary | ICD-10-CM

## 2023-02-23 DIAGNOSIS — E119 Type 2 diabetes mellitus without complications: Secondary | ICD-10-CM

## 2023-02-23 DIAGNOSIS — E559 Vitamin D deficiency, unspecified: Secondary | ICD-10-CM | POA: Diagnosis not present

## 2023-02-23 MED ORDER — SEMAGLUTIDE (1 MG/DOSE) 4 MG/3ML ~~LOC~~ SOPN
1.0000 mg | PEN_INJECTOR | SUBCUTANEOUS | 0 refills | Status: AC
Start: 2023-02-23 — End: 2023-03-23

## 2023-02-23 MED ORDER — VITAMIN D (ERGOCALCIFEROL) 1.25 MG (50000 UNIT) PO CAPS: 50000.0000 [IU] | ORAL_CAPSULE | ORAL | 0 refills | Status: DC

## 2023-02-24 LAB — CMP14+EGFR
ALT: 21 IU/L (ref 0–44)
AST: 14 IU/L (ref 0–40)
Albumin: 4.6 g/dL (ref 3.8–4.9)
Alkaline Phosphatase: 70 IU/L (ref 44–121)
BUN/Creatinine Ratio: 26 — ABNORMAL HIGH (ref 9–20)
BUN: 32 mg/dL — ABNORMAL HIGH (ref 6–24)
Bilirubin Total: 0.5 mg/dL (ref 0.0–1.2)
CO2: 23 mmol/L (ref 20–29)
Calcium: 10.2 mg/dL (ref 8.7–10.2)
Chloride: 101 mmol/L (ref 96–106)
Creatinine, Ser: 1.23 mg/dL (ref 0.76–1.27)
Globulin, Total: 2.2 g/dL (ref 1.5–4.5)
Glucose: 72 mg/dL (ref 70–99)
Potassium: 4.5 mmol/L (ref 3.5–5.2)
Sodium: 138 mmol/L (ref 134–144)
Total Protein: 6.8 g/dL (ref 6.0–8.5)
eGFR: 71 mL/min/{1.73_m2} (ref >59)

## 2023-02-24 LAB — LIPID PANEL WITH LDL/HDL RATIO
Cholesterol, Total: 98 mg/dL — ABNORMAL LOW (ref 100–199)
HDL: 34 mg/dL — ABNORMAL LOW (ref >39)
LDL Chol Calc (NIH): 38 mg/dL (ref 0–99)
LDL/HDL Ratio: 1.1 ratio (ref 0.0–3.6)
Triglycerides: 149 mg/dL (ref 0–149)
VLDL Cholesterol Cal: 26 mg/dL (ref 5–40)

## 2023-02-24 LAB — INSULIN, RANDOM: INSULIN: 33.6 u[IU]/mL — ABNORMAL HIGH (ref 2.6–24.9)

## 2023-02-24 LAB — VITAMIN B12: Vitamin B-12: 422 pg/mL (ref 232–1245)

## 2023-02-24 LAB — HEMOGLOBIN A1C
Est. average glucose Bld gHb Est-mCnc: 94 mg/dL
Hgb A1c MFr Bld: 4.9 % (ref 4.8–5.6)

## 2023-02-24 LAB — VITAMIN D 25 HYDROXY (VIT D DEFICIENCY, FRACTURES): Vit D, 25-Hydroxy: 54.6 ng/mL (ref 30.0–100.0)

## 2023-02-24 NOTE — Progress Notes (Signed)
Chief Complaint:   OBESITY Brandon [name] is here to discuss his progress with his obesity treatment plan along with follow-up of his obesity related diagnoses. Brandon [name] is on the Category 4 Plan and states he is following his eating plan approximately 50% of the time. Brandon [name] states he is walking and at the gym for 10 minutes 4 times per week.  Today's visit was #: 70 Starting weight: 305 lbs Starting date: 09/15/2017 Today's weight: 244 lbs Today's date: 02/23/2023 Total lbs lost to date: 61 Total lbs lost since last in-office visit: 4  Interim History: Patient has done well with his weight loss.  He is working on increasing his protein, but he is still struggling with skipping meals.  Subjective:   1. Type 2 diabetes mellitus without complication, without long-term current use of insulin (HCC) Patient is working on his diet and he notes only feeling hypoglycemic when he is missing meals.  His polyphagia is controlled.  2. Vitamin D deficiency Patient is on vitamin D, and his recent level was at goal.  Assessment/Plan:   1. Type 2 diabetes mellitus without complication, without long-term current use of insulin (HCC) We will check labs today, and we will refill Ozempic 1 mg for 1 month.  Patient will continue with his diet.  - Semaglutide, 1 MG/DOSE, 4 MG/3ML SOPN; Inject 1 mg as directed once a week for 28 days.  Dispense: 3 mL; Refill: 0 - Vitamin B12 - Lipid Panel With LDL/HDL Ratio - Insulin, random - Hemoglobin A1c - CMP14+EGFR  2. Vitamin D deficiency We will check labs today, and we will refill prescription vitamin D once weekly for 1 month.  We will follow-up at patient's next visit.  - Vitamin D, Ergocalciferol, (DRISDOL) 1.25 MG (50000 UNIT) CAPS capsule; Take 1 capsule (50,000 Units total) by mouth every 7 (seven) days.  Dispense: 5 capsule; Refill: 0 - VITAMIN D 25 Hydroxy (Vit-D Deficiency, Fractures)  3. BMI 37.0-37.9, adult  4. Obesity, Beginning BMI  46.38 Brandon [name] is currently in the action stage of change. As such, his goal is to continue with weight loss efforts. He has agreed to the Category 4 Plan.   Exercise goals: All adults should avoid inactivity. Some physical activity is better than none, and adults who participate in any amount of physical activity gain some health benefits.  Behavioral modification strategies: no skipping meals.  Brandon [name] has agreed to follow-up with our clinic in 4 weeks. He was informed of the importance of frequent follow-up visits to maximize his success with intensive lifestyle modifications for his multiple health conditions.   Brandon [name] was informed we would discuss his lab results at his next visit unless there is a critical issue that needs to be addressed sooner. Brandon [name] agreed to keep his next visit at the agreed upon time to discuss these results.  Objective:   Blood pressure 112/68, pulse 78, temperature 97.9 F (36.6 C), height 5\' 8"  (1.727 m), weight 244 lb (110.7 kg), SpO2 97%. Body mass index is 37.1 kg/m.  Lab Results  Component Value Date   CREATININE 1.23 02/23/2023   BUN 32 (H) 02/23/2023   NA 138 02/23/2023   K 4.5 02/23/2023   CL 101 02/23/2023   CO2 23 02/23/2023   Lab Results  Component Value Date   ALT 21 02/23/2023   AST 14 02/23/2023   ALKPHOS 70 02/23/2023   BILITOT 0.5 02/23/2023   Lab Results  Component Value Date   HGBA1C 4.9 02/23/2023  HGBA1C 5.9 (H) 09/01/2022   HGBA1C 5.5 04/30/2022   HGBA1C 5.8 (H) 01/21/2022   HGBA1C 6.7 (H) 08/12/2021   Lab Results  Component Value Date   INSULIN 33.6 (H) 02/23/2023   INSULIN 67.1 (H) 09/01/2022   INSULIN 46.8 (H) 04/30/2022   INSULIN 143.0 (H) 01/21/2022   INSULIN 266.0 (H) 08/12/2021   Lab Results  Component Value Date   TSH 2.310 09/01/2022   Lab Results  Component Value Date   CHOL 98 (L) 02/23/2023   HDL 34 (L) 02/23/2023   LDLCALC 38 02/23/2023   TRIG 149 02/23/2023   CHOLHDL 3.4 08/24/2017   Lab  Results  Component Value Date   VD25OH 54.6 02/23/2023   VD25OH 49.0 09/01/2022   VD25OH 53.7 04/30/2022   Lab Results  Component Value Date   WBC 4.9 09/01/2022   HGB 14.1 09/01/2022   HCT 43.4 09/01/2022   MCV 85 09/01/2022   PLT 152 09/01/2022   No results found for: "IRON", "TIBC", "FERRITIN"  Attestation Statements:   Reviewed by clinician on day of visit: allergies, medications, problem list, medical history, surgical history, family history, social history, and previous encounter notes.   I, Burt Knack, am acting as transcriptionist for Quillian Quince, MD.  I have reviewed the above documentation for accuracy and completeness, and I agree with the above. -  Quillian Quince, MD

## 2023-03-21 ENCOUNTER — Other Ambulatory Visit (INDEPENDENT_AMBULATORY_CARE_PROVIDER_SITE_OTHER): Payer: Self-pay | Admitting: Family Medicine

## 2023-03-21 DIAGNOSIS — E559 Vitamin D deficiency, unspecified: Secondary | ICD-10-CM

## 2023-03-24 ENCOUNTER — Encounter (INDEPENDENT_AMBULATORY_CARE_PROVIDER_SITE_OTHER): Payer: Self-pay | Admitting: Family Medicine

## 2023-03-24 ENCOUNTER — Ambulatory Visit (INDEPENDENT_AMBULATORY_CARE_PROVIDER_SITE_OTHER): Payer: 59 | Admitting: Family Medicine

## 2023-03-24 VITALS — BP 109/73 | HR 95 | Temp 98.0°F | Ht 68.0 in | Wt 240.0 lb

## 2023-03-24 DIAGNOSIS — E1169 Type 2 diabetes mellitus with other specified complication: Secondary | ICD-10-CM

## 2023-03-24 DIAGNOSIS — E669 Obesity, unspecified: Secondary | ICD-10-CM

## 2023-03-24 DIAGNOSIS — E559 Vitamin D deficiency, unspecified: Secondary | ICD-10-CM

## 2023-03-24 DIAGNOSIS — Z6836 Body mass index (BMI) 36.0-36.9, adult: Secondary | ICD-10-CM

## 2023-03-24 DIAGNOSIS — Z7985 Long-term (current) use of injectable non-insulin antidiabetic drugs: Secondary | ICD-10-CM

## 2023-03-24 MED ORDER — SEMAGLUTIDE (1 MG/DOSE) 4 MG/3ML ~~LOC~~ SOPN: 1.0000 mg | PEN_INJECTOR | SUBCUTANEOUS | 0 refills | Status: DC

## 2023-03-24 MED ORDER — VITAMIN D (ERGOCALCIFEROL) 1.25 MG (50000 UNIT) PO CAPS
50000.0000 [IU] | ORAL_CAPSULE | ORAL | 0 refills | Status: DC
Start: 2023-03-24 — End: 2023-04-21

## 2023-03-25 NOTE — Progress Notes (Signed)
Chief Complaint:   OBESITY Brandon [name] is here to discuss his progress with his obesity treatment plan along with follow-up of his obesity related diagnoses. Nelvin is on the Category 4 Plan and states he is following his eating plan approximately 50% of the time. Kayaan states he is walking for 15 minutes 2-3 times per week.  Today's visit was #: 71 Starting weight: 305 lbs Starting date: 09/15/2017 Today's weight: 240 lbs Today's date: 03/24/2023 Total lbs lost to date: 65 Total lbs lost since last in-office visit: 4  Interim History: Patient continues to work on his weight loss.  He is dealing with other health issues.  His energy has been low and he is not exercising as much.  Subjective:   1. Vitamin D deficiency Patient is on vitamin D prescription with no side effects noted.  2. Controlled type 2 diabetes mellitus with other specified complication, without long-term current use of insulin (HCC) Patient is on Ozempic with no signs of side effects.  He is having GI issues that his GI doctor does not think is related to Ozempic.  His glucose level is controlled.  Assessment/Plan:   1. Vitamin D deficiency Patient will continue once weekly prescription vitamin D, and we will refill for 1 month.  - Vitamin D, Ergocalciferol, (DRISDOL) 1.25 MG (50000 UNIT) CAPS capsule; Take 1 capsule (50,000 Units total) by mouth every 7 (seven) days.  Dispense: 5 capsule; Refill: 0  2. Controlled type 2 diabetes mellitus with other specified complication, without long-term current use of insulin (HCC) Patient will continue Ozempic at 1 mg once weekly, and we will refill for 1 month.  We will follow-up at his next visit in 1 month.  - Semaglutide, 1 MG/DOSE, 4 MG/3ML SOPN; Inject 1 mg as directed once a week.  Dispense: 3 mL; Refill: 0  3. BMI 36.0-36.9,adult  4. Obesity, Beginning BMI 46.38 Brandon [name] is currently in the action stage of change. As such, his goal is to maintain weight for  now. He has agreed to the Category 4 Plan.   Patient's goal is to maintain his weight while he is working on his other health issues.  Behavioral modification strategies: increasing lean protein intake.  [name] has agreed to follow-up with our clinic in 4 weeks. He was informed of the importance of frequent follow-up visits to maximize his success with intensive lifestyle modifications for his multiple health conditions.   Objective:   Blood pressure 109/73, pulse 95, temperature 98 F (36.7 C), height 5\' 8"  (1.727 m), weight 240 lb (108.9 kg), SpO2 97%. Body mass index is 36.49 kg/m.  Lab Results  Component Value Date   CREATININE 1.23 02/23/2023   BUN 32 (H) 02/23/2023   NA 138 02/23/2023   K 4.5 02/23/2023   CL 101 02/23/2023   CO2 23 02/23/2023   Lab Results  Component Value Date   ALT 21 02/23/2023   AST 14 02/23/2023   ALKPHOS 70 02/23/2023   BILITOT 0.5 02/23/2023   Lab Results  Component Value Date   HGBA1C 4.9 02/23/2023   HGBA1C 5.9 (H) 09/01/2022   HGBA1C 5.5 04/30/2022   HGBA1C 5.8 (H) 01/21/2022   HGBA1C 6.7 (H) 08/12/2021   Lab Results  Component Value Date   INSULIN 33.6 (H) 02/23/2023   INSULIN 67.1 (H) 09/01/2022   INSULIN 46.8 (H) 04/30/2022   INSULIN 143.0 (H) 01/21/2022   INSULIN 266.0 (H) 08/12/2021   Lab Results  Component Value Date   TSH 2.310  09/01/2022   Lab Results  Component Value Date   CHOL 98 (L) 02/23/2023   HDL 34 (L) 02/23/2023   LDLCALC 38 02/23/2023   TRIG 149 02/23/2023   CHOLHDL 3.4 08/24/2017   Lab Results  Component Value Date   VD25OH 54.6 02/23/2023   VD25OH 49.0 09/01/2022   VD25OH 53.7 04/30/2022   Lab Results  Component Value Date   WBC 4.9 09/01/2022   HGB 14.1 09/01/2022   HCT 43.4 09/01/2022   MCV 85 09/01/2022   PLT 152 09/01/2022   No results found for: "IRON", "TIBC", "FERRITIN"  Attestation Statements:   Reviewed by clinician on day of visit: allergies, medications, problem list, medical  history, surgical history, family history, social history, and previous encounter notes.   I, Burt Knack, am acting as transcriptionist for Quillian Quince, MD.  I have reviewed the above documentation for accuracy and completeness, and I agree with the above. -  Quillian Quince, MD

## 2023-04-06 DIAGNOSIS — R590 Localized enlarged lymph nodes: Secondary | ICD-10-CM | POA: Diagnosis not present

## 2023-04-06 DIAGNOSIS — K5900 Constipation, unspecified: Secondary | ICD-10-CM | POA: Diagnosis not present

## 2023-04-06 DIAGNOSIS — M7731 Calcaneal spur, right foot: Secondary | ICD-10-CM | POA: Diagnosis not present

## 2023-04-06 DIAGNOSIS — M19071 Primary osteoarthritis, right ankle and foot: Secondary | ICD-10-CM | POA: Diagnosis not present

## 2023-04-21 ENCOUNTER — Ambulatory Visit (INDEPENDENT_AMBULATORY_CARE_PROVIDER_SITE_OTHER): Payer: 59 | Admitting: Family Medicine

## 2023-04-21 ENCOUNTER — Encounter (INDEPENDENT_AMBULATORY_CARE_PROVIDER_SITE_OTHER): Payer: Self-pay | Admitting: Family Medicine

## 2023-04-21 VITALS — BP 105/73 | HR 92 | Temp 98.1°F | Ht 68.0 in | Wt 232.0 lb

## 2023-04-21 DIAGNOSIS — Z7985 Long-term (current) use of injectable non-insulin antidiabetic drugs: Secondary | ICD-10-CM | POA: Diagnosis not present

## 2023-04-21 DIAGNOSIS — E559 Vitamin D deficiency, unspecified: Secondary | ICD-10-CM

## 2023-04-21 DIAGNOSIS — R1084 Generalized abdominal pain: Secondary | ICD-10-CM | POA: Diagnosis not present

## 2023-04-21 DIAGNOSIS — Z6835 Body mass index (BMI) 35.0-35.9, adult: Secondary | ICD-10-CM

## 2023-04-21 DIAGNOSIS — E1169 Type 2 diabetes mellitus with other specified complication: Secondary | ICD-10-CM

## 2023-04-21 DIAGNOSIS — E669 Obesity, unspecified: Secondary | ICD-10-CM

## 2023-04-21 DIAGNOSIS — F439 Reaction to severe stress, unspecified: Secondary | ICD-10-CM

## 2023-04-21 MED ORDER — SEMAGLUTIDE (1 MG/DOSE) 4 MG/3ML ~~LOC~~ SOPN
1.0000 mg | PEN_INJECTOR | SUBCUTANEOUS | 0 refills | Status: DC
Start: 2023-04-21 — End: 2023-06-22

## 2023-04-21 MED ORDER — VITAMIN D (ERGOCALCIFEROL) 1.25 MG (50000 UNIT) PO CAPS
50000.0000 [IU] | ORAL_CAPSULE | ORAL | 0 refills | Status: DC
Start: 2023-04-21 — End: 2023-06-22

## 2023-04-27 NOTE — Progress Notes (Unsigned)
Chief Complaint:   OBESITY Brandon [name] is here to discuss his progress with his obesity treatment plan along with follow-up of his obesity related diagnoses. Brandon [name] is on the Category 4 Plan and states he is following his eating plan approximately 50% of the time. Brandon [name] states he is walking for 10-15 minutes 4 times per week.  Today's visit was #: 72 Starting weight: 305 lbs Starting date: 09/15/2017 Today's weight: 232 lbs Today's date: 04/21/2023 Total lbs lost to date: 73 Total lbs lost since last in-office visit: 8  Interim History: Patient continues to work on his weight loss and he is working on not skipping meals.  He is working on increasing salads, but he is not eating enough protein.  He is dealing with multiple uncertain diagnosis and stress has been high.  Subjective:   1. Generalized abdominal pain Patient followed up with GI, Dr. Wynelle Beckmann at [facility].  There was a concern of some gastroparesis, but he was okay to continue Ozempic with close observation.  2. Stress Patient has multiple family and health stressors.  He has serious concerns with having cancer and he is waiting on test results and diagnosis confirmation.  He is currently in between therapist.  3. Vitamin D deficiency Patient is stable on vitamin D, and he is at risk of high levels with continued weight loss.  4. Controlled type 2 diabetes mellitus with other specified complication, without long-term current use of insulin (HCC) Patient is on Ozempic, and he was cleared to continue Ozempic by GI.  He is working on his diet and weight loss to improve his glucose.  Assessment/Plan:   1. Generalized abdominal pain Patient was advised to increase his water intake and his fiber intake.  Patient was encouraged to continue to work with Dr. Wynelle Beckmann.  2. Stress Patient was offered support and encouragement, and we will follow-up at his next visit.   3. Vitamin D deficiency We will refill prescription vitamin D  50,000 IU every 7 days for 1 month, and we will recheck labs in 1 month.  - Vitamin D, Ergocalciferol, (DRISDOL) 1.25 MG (50000 UNIT) CAPS capsule; Take 1 capsule (50,000 Units total) by mouth every 7 (seven) days.  Dispense: 5 capsule; Refill: 0  4. Controlled type 2 diabetes mellitus with other specified complication, without long-term current use of insulin (HCC) Patient will continue Ozempic at 1 mg once weekly, and we will refill for 1 month.  We will follow-up at his next visit in 1 month.  - Semaglutide, 1 MG/DOSE, 4 MG/3ML SOPN; Inject 1 mg as directed once a week.  Dispense: 3 mL; Refill: 0  5. BMI 35.0-35.9,adult  6. Obesity, Beginning BMI 46.38 Brandon [name] is currently in the action stage of change. As such, his goal is to continue with weight loss efforts. He has agreed to the Category 4 Plan.   Exercise goals: As is.   Behavioral modification strategies: increasing lean protein intake, no skipping meals, and meal planning and cooking strategies.  Brandon [name] has agreed to follow-up with our clinic in 4 weeks. He was informed of the importance of frequent follow-up visits to maximize his success with intensive lifestyle modifications for his multiple health conditions.   Objective:   Blood pressure 105/73, pulse 92, temperature 98.1 F (36.7 C), height 5\' 8"  (1.727 m), weight 232 lb (105.2 kg), SpO2 98%. Body mass index is 35.28 kg/m.  Lab Results  Component Value Date   CREATININE 1.23 02/23/2023   BUN 32 (H) 02/23/2023  NA 138 02/23/2023   K 4.5 02/23/2023   CL 101 02/23/2023   CO2 23 02/23/2023   Lab Results  Component Value Date   ALT 21 02/23/2023   AST 14 02/23/2023   ALKPHOS 70 02/23/2023   BILITOT 0.5 02/23/2023   Lab Results  Component Value Date   HGBA1C 4.9 02/23/2023   HGBA1C 5.9 (H) 09/01/2022   HGBA1C 5.5 04/30/2022   HGBA1C 5.8 (H) 01/21/2022   HGBA1C 6.7 (H) 08/12/2021   Lab Results  Component Value Date   INSULIN 33.6 (H) 02/23/2023    INSULIN 67.1 (H) 09/01/2022   INSULIN 46.8 (H) 04/30/2022   INSULIN 143.0 (H) 01/21/2022   INSULIN 266.0 (H) 08/12/2021   Lab Results  Component Value Date   TSH 2.310 09/01/2022   Lab Results  Component Value Date   CHOL 98 (L) 02/23/2023   HDL 34 (L) 02/23/2023   LDLCALC 38 02/23/2023   TRIG 149 02/23/2023   CHOLHDL 3.4 08/24/2017   Lab Results  Component Value Date   VD25OH 54.6 02/23/2023   VD25OH 49.0 09/01/2022   VD25OH 53.7 04/30/2022   Lab Results  Component Value Date   WBC 4.9 09/01/2022   HGB 14.1 09/01/2022   HCT 43.4 09/01/2022   MCV 85 09/01/2022   PLT 152 09/01/2022   No results found for: "IRON", "TIBC", "FERRITIN"  Attestation Statements:   Reviewed by clinician on day of visit: allergies, medications, problem list, medical history, surgical history, family history, social history, and previous encounter notes.  Time spent on visit including pre-visit chart review and post-visit care and charting was 45 minutes.   I, Burt Knack, am acting as transcriptionist for Quillian Quince, MD.  I have reviewed the above documentation for accuracy and completeness, and I agree with the above. -  Quillian Quince, MD

## 2023-05-26 ENCOUNTER — Ambulatory Visit (INDEPENDENT_AMBULATORY_CARE_PROVIDER_SITE_OTHER): Payer: 59 | Admitting: Family Medicine

## 2023-06-22 ENCOUNTER — Ambulatory Visit (INDEPENDENT_AMBULATORY_CARE_PROVIDER_SITE_OTHER): Payer: 59 | Admitting: Family Medicine

## 2023-06-22 ENCOUNTER — Encounter (INDEPENDENT_AMBULATORY_CARE_PROVIDER_SITE_OTHER): Payer: Self-pay | Admitting: Family Medicine

## 2023-06-22 VITALS — BP 101/61 | HR 76 | Temp 98.4°F | Ht 68.0 in | Wt 237.0 lb

## 2023-06-22 DIAGNOSIS — E119 Type 2 diabetes mellitus without complications: Secondary | ICD-10-CM | POA: Diagnosis not present

## 2023-06-22 DIAGNOSIS — R109 Unspecified abdominal pain: Secondary | ICD-10-CM | POA: Diagnosis not present

## 2023-06-22 DIAGNOSIS — F3289 Other specified depressive episodes: Secondary | ICD-10-CM

## 2023-06-22 DIAGNOSIS — F32A Depression, unspecified: Secondary | ICD-10-CM | POA: Diagnosis not present

## 2023-06-22 DIAGNOSIS — E559 Vitamin D deficiency, unspecified: Secondary | ICD-10-CM | POA: Diagnosis not present

## 2023-06-22 DIAGNOSIS — E1169 Type 2 diabetes mellitus with other specified complication: Secondary | ICD-10-CM

## 2023-06-22 DIAGNOSIS — E669 Obesity, unspecified: Secondary | ICD-10-CM

## 2023-06-22 DIAGNOSIS — Z6836 Body mass index (BMI) 36.0-36.9, adult: Secondary | ICD-10-CM

## 2023-06-22 DIAGNOSIS — Z7985 Long-term (current) use of injectable non-insulin antidiabetic drugs: Secondary | ICD-10-CM

## 2023-06-22 MED ORDER — SEMAGLUTIDE (1 MG/DOSE) 4 MG/3ML ~~LOC~~ SOPN: 1.0000 mg | PEN_INJECTOR | SUBCUTANEOUS | 0 refills | Status: DC

## 2023-06-22 MED ORDER — VITAMIN D (ERGOCALCIFEROL) 1.25 MG (50000 UNIT) PO CAPS: 50000.0000 [IU] | ORAL_CAPSULE | ORAL | 0 refills | Status: DC

## 2023-06-22 NOTE — Progress Notes (Signed)
.smr  Office: [phone]  /  Fax: [phone]  WEIGHT SUMMARY AND BIOMETRICS  Anthropometric Measurements Height: 5\' 8"  (1.727 m) Weight: 237 lb (107.5 kg) BMI (Calculated): 36.04 Weight at Last Visit: 232 lb Weight Lost Since Last Visit: 0 Weight Gained Since Last Visit: 5 lb Starting Weight: 305 lb Total Weight Loss (lbs): 68 lb (30.8 kg)   Body Composition  Body Fat %: 29.6 % Fat Mass (lbs): 70.4 lbs Muscle Mass (lbs): 159 lbs Total Body Water (lbs): 112.4 lbs Visceral Fat Rating : 17   Other Clinical Data Fasting: Yes Labs: No Today's Visit #: 81 Starting Date: 09/15/17    Chief Complaint: OBESITY   Discussed the use of AI scribe software for clinical note transcription with the patient, who gave verbal consent to proceed.  History of Present Illness   The patient, a known case of type 2 diabetes, vitamin D deficiency, and obesity, presents for a routine follow-up. He reports a recent weight gain of five pounds over the last two months, despite overall successful weight loss efforts. His diabetes is well-controlled on Ozempic, with a recent HbA1c of 4.9. However, he has been experiencing abdominal pain, the etiology of which is uncertain. The patient is resistant to decreasing the dose of Ozempic, suspecting it may not be the cause of the abdominal discomfort. He is also on weekly prescription vitamin D, with a recent level of 54 in August.  The patient has been experiencing worsening neurological symptoms, which have been evaluated by a neurologist. 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 The patient has been experiencing increasing fatigue, for which B12 and thyroid levels were checked. An MRI was deferred due to the likelihood of progression of the neurological condition and the patient's worsening symptoms.  He has a history of falls and there is a concern for a possible rotator cuff injury. An  MRI has been scheduled for further evaluation.  In addition to his physical health concerns, the patient is dealing with significant stressors, including ongoing litigation. He reports having a supportive network of friends but does not currently have a Tax adviser. He has a history of complex and chronic illnesses, which have necessitated a high level of self-awareness and knowledge about his health.  The patient is also dealing with gastrointestinal symptoms, which have been worsening over the past week. He reports a loss of appetite and has been trying to maintain adequate nutrition. However, the patient reports that the gastroenterologist has recently expressed uncertainty about this diagnosis and plans to conduct further investigations. He states specifically his GI doctor does NOT think this abdominal pain is due to GLP-1          PHYSICAL EXAM:  Blood pressure 101/61, pulse 76, temperature 98.4 F (36.9 C), height 5\' 8"  (1.727 m), weight 237 lb (107.5 kg), SpO2 96%. Body mass index is 36.04 kg/m.  DIAGNOSTIC DATA REVIEWED:  BMET    Component Value Date/Time   NA 138 02/23/2023 1241   K 4.5 02/23/2023 1241   CL 101 02/23/2023 1241   CO2 23 02/23/2023 1241   GLUCOSE 72 02/23/2023 1241   GLUCOSE 123 (H) 06/25/2022 1621   BUN 32 (H) 02/23/2023 1241   CREATININE 1.23 02/23/2023 1241   CREATININE 1.02 08/24/2017 1458   CALCIUM 10.2 02/23/2023 1241   GFRNONAA >60 06/25/2022 1621   GFRNONAA >89 07/18/2015 1656   GFRAA 112 08/06/2020 0734   GFRAA >89 07/18/2015 1656   Lab  Results  Component Value Date   HGBA1C 4.9 02/23/2023   HGBA1C 5.4 07/03/2011   Lab Results  Component Value Date   INSULIN 33.6 (H) 02/23/2023   INSULIN 110.6 (H) 09/15/2017   Lab Results  Component Value Date   TSH 2.310 09/01/2022   CBC    Component Value Date/Time   WBC 4.9 09/01/2022 0950   WBC 6.9 06/25/2022 1621   RBC 5.10 09/01/2022 0950   RBC 6.17 (H) 06/25/2022 1621   HGB  14.1 09/01/2022 0950   HCT 43.4 09/01/2022 0950   PLT 152 09/01/2022 0950   MCV 85 09/01/2022 0950   MCH 27.6 09/01/2022 0950   MCH 27.7 06/25/2022 1621   MCHC 32.5 09/01/2022 0950   MCHC 34.6 06/25/2022 1621   RDW 15.1 09/01/2022 0950   Iron Studies No results found for: "IRON", "TIBC", "FERRITIN", "IRONPCTSAT" Lipid Panel     Component Value Date/Time   CHOL 98 (L) 02/23/2023 1241   TRIG 149 02/23/2023 1241   HDL 34 (L) 02/23/2023 1241   CHOLHDL 3.4 08/24/2017 1458   VLDL 70 (H) 10/15/2015 1112   LDLCALC 38 02/23/2023 1241   LDLCALC 57 08/24/2017 1458   Hepatic Function Panel     Component Value Date/Time   PROT 6.8 02/23/2023 1241   ALBUMIN 4.6 02/23/2023 1241   AST 14 02/23/2023 1241   ALT 21 02/23/2023 1241   ALKPHOS 70 02/23/2023 1241   BILITOT 0.5 02/23/2023 1241   BILIDIR 0.1 04/11/2015 1638   IBILI 0.6 04/11/2015 1638      Component Value Date/Time   TSH 2.310 09/01/2022 0950   Nutritional Lab Results  Component Value Date   VD25OH 54.6 02/23/2023   VD25OH 49.0 09/01/2022   VD25OH 53.7 04/30/2022     Assessment and Plan    Abdominal Pain Abdominal pain of uncertain etiology. GI specialist involved in ongoing evaluation. Discussed potential need for further imaging and the importance of identifying the cause before treatment. He has been told this is not related to his Ozempic. - Follow up with GI specialist on Thursday for further evaluation and possible scan -verify he is ok to continue Ozempic with his GI doctor -will add amylase and lipase to his labs today  Type 2 Diabetes Mellitus Type 2 diabetes is well-controlled with Ozempic. Most recent hemoglobin A1c was 4.9. Experiencing abdominal pain, which may be related to Ozempic, but resistant to decreasing the dose. Discussed risks of continuing current dose, including potential gastrointestinal side effects, and benefits of maintaining glycemic control. - Continue Ozempic - Monitor abdominal pain  and consider dose adjustment if symptoms persist  Obesity Experienced a 5-pound weight gain over the last two months. Aware of the need to manage weight, especially during the holiday season. Discussed benefits of weight management and potential risks of obesity, including cardiovascular disease and diabetes complications. - Encourage continued weight management efforts - Monitor weight  Vitamin D Deficiency Vitamin D deficiency managed with prescription vitamin D 50,000 IU weekly. Most recent vitamin D level was 54 in August. Due for labs to reassess levels. Inconsistent with taking vitamin D. - Order vitamin D level after the holidays  Depression Due for routine labs and has been fasting. Managing stress with support from friends and family. Discussed importance of stress management for overall health. - Perform fasting labs today - Encourage stress management and support from friends and family  Follow-up - Schedule follow-up visit in four to five weeks.  I have personally spent 40 minutes total time today in preparation, patient care, and documentation for this visit, including the following: review of clinical lab tests; review of medical tests/procedures/services.    He was informed of the importance of frequent follow up visits to maximize his success with intensive lifestyle modifications for his multiple health conditions.    Quillian Quince, MD

## 2023-06-23 LAB — LIPID PANEL WITH LDL/HDL RATIO
Cholesterol, Total: 114 mg/dL (ref 100–199)
HDL: 40 mg/dL (ref >39)
LDL Chol Calc (NIH): 51 mg/dL (ref 0–99)
LDL/HDL Ratio: 1.3 {ratio} (ref 0.0–3.6)
Triglycerides: 130 mg/dL (ref 0–149)
VLDL Cholesterol Cal: 23 mg/dL (ref 5–40)

## 2023-06-23 LAB — LIPASE: Lipase: 40 U/L (ref 13–78)

## 2023-06-23 LAB — CMP14+EGFR
ALT: 13 [IU]/L (ref 0–44)
AST: 11 [IU]/L (ref 0–40)
Albumin: 4.2 g/dL (ref 3.8–4.9)
Alkaline Phosphatase: 58 [IU]/L (ref 44–121)
BUN/Creatinine Ratio: 21 — ABNORMAL HIGH (ref 9–20)
BUN: 26 mg/dL — ABNORMAL HIGH (ref 6–24)
Bilirubin Total: 0.5 mg/dL (ref 0.0–1.2)
CO2: 22 mmol/L (ref 20–29)
Calcium: 9.4 mg/dL (ref 8.7–10.2)
Chloride: 103 mmol/L (ref 96–106)
Creatinine, Ser: 1.24 mg/dL (ref 0.76–1.27)
Globulin, Total: 2 g/dL (ref 1.5–4.5)
Glucose: 80 mg/dL (ref 70–99)
Potassium: 5.1 mmol/L (ref 3.5–5.2)
Sodium: 141 mmol/L (ref 134–144)
Total Protein: 6.2 g/dL (ref 6.0–8.5)
eGFR: 70 mL/min/{1.73_m2} (ref >59)

## 2023-06-23 LAB — HEMOGLOBIN A1C
Est. average glucose Bld gHb Est-mCnc: 108 mg/dL
Hgb A1c MFr Bld: 5.4 % (ref 4.8–5.6)

## 2023-06-23 LAB — VITAMIN D 25 HYDROXY (VIT D DEFICIENCY, FRACTURES): Vit D, 25-Hydroxy: 37.5 ng/mL (ref 30.0–100.0)

## 2023-06-23 LAB — INSULIN, RANDOM: INSULIN: 38.9 u[IU]/mL — ABNORMAL HIGH (ref 2.6–24.9)

## 2023-06-23 LAB — AMYLASE: Amylase: 89 U/L (ref 31–110)

## 2023-06-23 LAB — VITAMIN B12: Vitamin B-12: 304 pg/mL (ref 232–1245)

## 2023-06-24 DIAGNOSIS — E1159 Type 2 diabetes mellitus with other circulatory complications: Secondary | ICD-10-CM | POA: Diagnosis not present

## 2023-06-24 DIAGNOSIS — R1011 Right upper quadrant pain: Secondary | ICD-10-CM | POA: Diagnosis not present

## 2023-06-24 DIAGNOSIS — I152 Hypertension secondary to endocrine disorders: Secondary | ICD-10-CM | POA: Diagnosis not present

## 2023-07-19 ENCOUNTER — Other Ambulatory Visit (INDEPENDENT_AMBULATORY_CARE_PROVIDER_SITE_OTHER): Payer: Self-pay | Admitting: Family Medicine

## 2023-07-19 DIAGNOSIS — E1169 Type 2 diabetes mellitus with other specified complication: Secondary | ICD-10-CM

## 2023-07-23 DIAGNOSIS — R1013 Epigastric pain: Secondary | ICD-10-CM | POA: Diagnosis not present

## 2023-07-29 DIAGNOSIS — K3184 Gastroparesis: Secondary | ICD-10-CM | POA: Diagnosis not present

## 2023-08-03 DIAGNOSIS — N529 Male erectile dysfunction, unspecified: Secondary | ICD-10-CM | POA: Diagnosis not present

## 2023-08-03 DIAGNOSIS — Z23 Encounter for immunization: Secondary | ICD-10-CM | POA: Diagnosis not present

## 2023-08-03 DIAGNOSIS — E66813 Obesity, class 3: Secondary | ICD-10-CM | POA: Diagnosis not present

## 2023-08-03 DIAGNOSIS — E1169 Type 2 diabetes mellitus with other specified complication: Secondary | ICD-10-CM | POA: Diagnosis not present

## 2023-08-03 DIAGNOSIS — Z6841 Body Mass Index (BMI) 40.0 and over, adult: Secondary | ICD-10-CM | POA: Diagnosis not present

## 2023-08-03 DIAGNOSIS — F321 Major depressive disorder, single episode, moderate: Secondary | ICD-10-CM | POA: Diagnosis not present

## 2023-08-03 DIAGNOSIS — G0400 Acute disseminated encephalitis and encephalomyelitis, unspecified: Secondary | ICD-10-CM | POA: Diagnosis not present

## 2023-08-03 DIAGNOSIS — K3184 Gastroparesis: Secondary | ICD-10-CM | POA: Diagnosis not present

## 2023-08-03 DIAGNOSIS — G959 Disease of spinal cord, unspecified: Secondary | ICD-10-CM | POA: Diagnosis not present

## 2023-08-03 DIAGNOSIS — F640 Transsexualism: Secondary | ICD-10-CM | POA: Diagnosis not present

## 2023-08-03 DIAGNOSIS — F649 Gender identity disorder, unspecified: Secondary | ICD-10-CM | POA: Diagnosis not present

## 2023-08-03 LAB — LIPID PANEL
Cholesterol: 104 (ref 0–200)
HDL: 39 (ref 35–70)
LDL Cholesterol: 41
LDl/HDL Ratio: 1.1
Triglycerides: 135 (ref 40–160)

## 2023-08-03 LAB — VITAMIN D 25 HYDROXY (VIT D DEFICIENCY, FRACTURES)
Vit D, 25-Hydroxy: 31.3
Vit D, 25-Hydroxy: 31.3

## 2023-08-03 LAB — HEMOGLOBIN A1C: Hemoglobin A1C: 5.1

## 2023-08-04 ENCOUNTER — Encounter (INDEPENDENT_AMBULATORY_CARE_PROVIDER_SITE_OTHER): Payer: Self-pay | Admitting: Family Medicine

## 2023-08-04 ENCOUNTER — Ambulatory Visit (INDEPENDENT_AMBULATORY_CARE_PROVIDER_SITE_OTHER): Payer: 59 | Admitting: Family Medicine

## 2023-08-04 VITALS — BP 103/68 | HR 110 | Temp 98.5°F | Ht 68.0 in | Wt 237.0 lb

## 2023-08-04 DIAGNOSIS — Z7985 Long-term (current) use of injectable non-insulin antidiabetic drugs: Secondary | ICD-10-CM

## 2023-08-04 DIAGNOSIS — F439 Reaction to severe stress, unspecified: Secondary | ICD-10-CM | POA: Diagnosis not present

## 2023-08-04 DIAGNOSIS — E119 Type 2 diabetes mellitus without complications: Secondary | ICD-10-CM | POA: Diagnosis not present

## 2023-08-04 DIAGNOSIS — Z6836 Body mass index (BMI) 36.0-36.9, adult: Secondary | ICD-10-CM

## 2023-08-04 DIAGNOSIS — E669 Obesity, unspecified: Secondary | ICD-10-CM

## 2023-08-04 DIAGNOSIS — E1169 Type 2 diabetes mellitus with other specified complication: Secondary | ICD-10-CM

## 2023-08-04 NOTE — Progress Notes (Signed)
.smr  Office: [phone]  /  Fax: [phone]  WEIGHT SUMMARY AND BIOMETRICS  Anthropometric Measurements Height: 5\' 8"  (1.727 m) Weight: 237 lb (107.5 kg) BMI (Calculated): 36.04 Weight at Last Visit: 237 lb Weight Lost Since Last Visit: 0 Weight Gained Since Last Visit: 0 Starting Weight: 305 lb Total Weight Loss (lbs): 68 lb (30.8 kg)   Body Composition  Body Fat %: 30.2 % Fat Mass (lbs): 71.8 lbs Muscle Mass (lbs): 157.8 lbs Total Body Water (lbs): 109.6 lbs Visceral Fat Rating : 17   Other Clinical Data Fasting: No Labs: No Today's Visit #: 20 Starting Date: 09/15/17    Chief Complaint: OBESITY   Discussed the use of AI scribe software for clinical note transcription with the patient, who gave verbal consent to proceed.  History of Present Illness   The patient presents with obesity and related gastrointestinal issues.  He is managing obesity and has maintained his weight over the past month. He follows his category four eating plan about fifty percent of the time and is attempting to increase his walking to ten minutes four times per week. He is currently taking Ozempic for obesity and has seen improvements in his A1c levels.  He underwent a recent gastric emptying procedure that revealed solid food masses and scar tissue in his stomach, raising concerns about potential cancer. Some scans suggested cancer, but it might be encapsulated. He is monitoring for specific symptoms that could indicate stomach cancer and is scheduled for another scan in three months. He is scheduled for a EGD soon per Dr Wynelle Beckmann  He has been diagnosed with gastroparesis and is eating small, frequent meals, approximately seven per day. He struggles with appetite and finds it difficult to consume the recommended number of meals. He has been prescribed Reglan to be taken thirty minutes before meals and is avoiding high residue foods such as raw vegetables, pulpy fruits, and greasy meals. He  prefers citrus fruits and juices but is limiting them.  He has a history of massive stomach spasms and neurological issues, possibly related to a plaque in his brain. He contracted COVID-19 while on a medication called AIDA, which may have exacerbated his symptoms. He is experiencing significant stress due to multiple family, social, and legal stressors, which may be impacting his overall health.          PHYSICAL EXAM:  Blood pressure 103/68, pulse (!) 110, temperature 98.5 F (36.9 C), height 5\' 8"  (1.727 m), weight 237 lb (107.5 kg), SpO2 98%. Body mass index is 36.04 kg/m.  DIAGNOSTIC DATA REVIEWED:  BMET    Component Value Date/Time   NA 141 06/22/2023 1151   K 5.1 06/22/2023 1151   CL 103 06/22/2023 1151   CO2 22 06/22/2023 1151   GLUCOSE 80 06/22/2023 1151   GLUCOSE 123 (H) 06/25/2022 1621   BUN 26 (H) 06/22/2023 1151   CREATININE 1.24 06/22/2023 1151   CREATININE 1.02 08/24/2017 1458   CALCIUM 9.4 06/22/2023 1151   GFRNONAA >60 06/25/2022 1621   GFRNONAA >89 07/18/2015 1656   GFRAA 112 08/06/2020 0734   GFRAA >89 07/18/2015 1656   Lab Results  Component Value Date   HGBA1C 5.4 06/22/2023   HGBA1C 5.4 07/03/2011   Lab Results  Component Value Date   INSULIN 38.9 (H) 06/22/2023   INSULIN 110.6 (H) 09/15/2017   Lab Results  Component Value Date   TSH 2.310 09/01/2022   CBC    Component Value Date/Time   WBC 4.9 09/01/2022 0950  WBC 6.9 06/25/2022 1621   RBC 5.10 09/01/2022 0950   RBC 6.17 (H) 06/25/2022 1621   HGB 14.1 09/01/2022 0950   HCT 43.4 09/01/2022 0950   PLT 152 09/01/2022 0950   MCV 85 09/01/2022 0950   MCH 27.6 09/01/2022 0950   MCH 27.7 06/25/2022 1621   MCHC 32.5 09/01/2022 0950   MCHC 34.6 06/25/2022 1621   RDW 15.1 09/01/2022 0950   Iron Studies No results found for: "IRON", "TIBC", "FERRITIN", "IRONPCTSAT" Lipid Panel     Component Value Date/Time   CHOL 114 06/22/2023 1151   TRIG 130 06/22/2023 1151   HDL 40 06/22/2023  1151   CHOLHDL 3.4 08/24/2017 1458   VLDL 70 (H) 10/15/2015 1112   LDLCALC 51 06/22/2023 1151   LDLCALC 57 08/24/2017 1458   Hepatic Function Panel     Component Value Date/Time   PROT 6.2 06/22/2023 1151   ALBUMIN 4.2 06/22/2023 1151   AST 11 06/22/2023 1151   ALT 13 06/22/2023 1151   ALKPHOS 58 06/22/2023 1151   BILITOT 0.5 06/22/2023 1151   BILIDIR 0.1 04/11/2015 1638   IBILI 0.6 04/11/2015 1638      Component Value Date/Time   TSH 2.310 09/01/2022 0950   Nutritional Lab Results  Component Value Date   VD25OH 37.5 06/22/2023   VD25OH 54.6 02/23/2023   VD25OH 49.0 09/01/2022     Assessment and Plan    diabetes and Gastroparesis Diagnosed by GI specialist. Symptoms include difficulty with solid food masses and presence of scar tissue. On Reglan to improve gastric motility. Advised to eat small, frequent meals and avoid high residue foods. Discussed monitoring for worsening symptoms and potential need for further intervention. He states he was told by Dr Wynelle Beckmann his gastroparesis was not due to Ozempic. - Take Reglan 30 minutes before meals - Eat small, frequent meals (6-7 per day) - Avoid high residue foods (raw vegetables, pulpy fruits, greasy meals) - Refill Ozempic after he bring in written verification of approval to restart GLP-1 with gastroparesis diagnosis.  Obesity Obesity with stable weight over the past month. Following a category four eating plan about 50% of the time and attempting to increase walking to ten minutes four times per week. Discussed the importance of small, frequent meals and increasing protein intake while avoiding high residue foods. Informed consent obtained for continuing Ozempic, pending clearance from gastroenterologist due to gastroparesis. Risks include potential exacerbation of gastroparesis symptoms; benefits include improved glycemic control and weight management. - Continue category four eating plan - Increase walking to ten minutes  four times per week  Stress Multiple family, social, and legal stressors contributing to overall stress levels. Discussed the impact of stress on overall health and the importance of stress management strategies. - Will monitor mood closely - Seek support for stress management if needed  General Health Maintenance Emphasized the need for a balanced diet with adequate protein and low-fat options. Recommended low-fat milk and protein shakes as meal options to ensure adequate nutrition while managing gastroparesis. - Increase protein intake in small meals - Consider low-fat milk and protein shakes as meal options  Follow-up - Follow-up in four weeks - Patient to Obtain clearance from gastroenterologist for Ozempic         I have personally spent 40 minutes total time today in preparation, patient care, and documentation for this visit, including the following: review of clinical lab tests; review of medical tests/procedures/services.    He was informed of the importance of frequent follow  up visits to maximize his success with intensive lifestyle modifications for his multiple health conditions.    Quillian Quince, MD

## 2023-08-05 ENCOUNTER — Other Ambulatory Visit (INDEPENDENT_AMBULATORY_CARE_PROVIDER_SITE_OTHER): Payer: Self-pay | Admitting: Family Medicine

## 2023-08-05 DIAGNOSIS — E1169 Type 2 diabetes mellitus with other specified complication: Secondary | ICD-10-CM

## 2023-08-05 MED ORDER — SEMAGLUTIDE (1 MG/DOSE) 4 MG/3ML ~~LOC~~ SOPN: 1.0000 mg | PEN_INJECTOR | SUBCUTANEOUS | 0 refills | Status: DC

## 2023-08-09 DIAGNOSIS — R9082 White matter disease, unspecified: Secondary | ICD-10-CM | POA: Diagnosis not present

## 2023-09-05 ENCOUNTER — Other Ambulatory Visit (INDEPENDENT_AMBULATORY_CARE_PROVIDER_SITE_OTHER): Payer: Self-pay | Admitting: Family Medicine

## 2023-09-05 DIAGNOSIS — E1169 Type 2 diabetes mellitus with other specified complication: Secondary | ICD-10-CM

## 2023-09-06 ENCOUNTER — Encounter (INDEPENDENT_AMBULATORY_CARE_PROVIDER_SITE_OTHER): Payer: Self-pay | Admitting: Family Medicine

## 2023-09-06 ENCOUNTER — Ambulatory Visit (INDEPENDENT_AMBULATORY_CARE_PROVIDER_SITE_OTHER): Payer: 59 | Admitting: Family Medicine

## 2023-09-06 VITALS — BP 97/65 | HR 80 | Temp 98.4°F | Ht 68.0 in | Wt 244.0 lb

## 2023-09-06 DIAGNOSIS — E119 Type 2 diabetes mellitus without complications: Secondary | ICD-10-CM

## 2023-09-06 DIAGNOSIS — E669 Obesity, unspecified: Secondary | ICD-10-CM

## 2023-09-06 DIAGNOSIS — Z7984 Long term (current) use of oral hypoglycemic drugs: Secondary | ICD-10-CM

## 2023-09-06 DIAGNOSIS — E1169 Type 2 diabetes mellitus with other specified complication: Secondary | ICD-10-CM

## 2023-09-06 DIAGNOSIS — Z6837 Body mass index (BMI) 37.0-37.9, adult: Secondary | ICD-10-CM

## 2023-09-06 DIAGNOSIS — I10 Essential (primary) hypertension: Secondary | ICD-10-CM | POA: Diagnosis not present

## 2023-09-06 DIAGNOSIS — M5033 Other cervical disc degeneration, cervicothoracic region: Secondary | ICD-10-CM | POA: Diagnosis not present

## 2023-09-06 DIAGNOSIS — Z7985 Long-term (current) use of injectable non-insulin antidiabetic drugs: Secondary | ICD-10-CM

## 2023-09-06 DIAGNOSIS — E66813 Obesity, class 3: Secondary | ICD-10-CM

## 2023-09-06 DIAGNOSIS — Z981 Arthrodesis status: Secondary | ICD-10-CM | POA: Diagnosis not present

## 2023-09-06 DIAGNOSIS — M5031 Other cervical disc degeneration,  high cervical region: Secondary | ICD-10-CM | POA: Diagnosis not present

## 2023-09-06 MED ORDER — SEMAGLUTIDE (1 MG/DOSE) 4 MG/3ML ~~LOC~~ SOPN: 1.0000 mg | PEN_INJECTOR | SUBCUTANEOUS | 0 refills | Status: DC

## 2023-09-06 NOTE — Progress Notes (Signed)
 Office: [phone]  /  Fax: [phone]  WEIGHT SUMMARY AND BIOMETRICS  Anthropometric Measurements Height: 5\' 8"  (1.727 m) Weight: 244 lb (110.7 kg) BMI (Calculated): 37.11 Weight at Last Visit: 237 lb Weight Lost Since Last Visit: 0 Weight Gained Since Last Visit: 7 lb Starting Weight: 305 lb Total Weight Loss (lbs): 61 lb (27.7 kg) Peak Weight: 315 lb   Body Composition  Body Fat %: 28.5 % Fat Mass (lbs): 69.8 lbs Muscle Mass (lbs): 166.4 lbs Total Body Water (lbs): 113.2 lbs Visceral Fat Rating : 17   Other Clinical Data Fasting: no Labs: no Today's Visit #: 75 Starting Date: 09/15/17    Chief Complaint: OBESITY   Discussed the use of AI scribe software for clinical note transcription with the patient, who gave verbal consent to proceed.  History of Present Illness   Brandon [name] "[email or web address]" is a 53 year old male with obesity and type 2 diabetes who presents for obesity treatment plan discussion and progress monitoring.  He has difficulty adhering to his eating plan, specifically the category four plus two hundred calories plan, and has been unable to exercise. Over the past five weeks, he has gained seven pounds, attributing this to a lack of physical activity and dietary indiscretions, including consuming ice cream and chocolate mousse during his birthday. He mentions a sugar addiction and difficulty with portion control, particularly with sweets.  He is currently on Ozempic for type 2 diabetes and reports good glycemic control, with a hemoglobin A1c of 5.4 as of December 2024. He requests a refill of Ozempic. Additionally, he is taking metformin XR 1000 mg twice daily.  He describes gastrointestinal symptoms, including stomach discomfort and acid reflux, particularly after consuming spicy foods like pepperoncini peppers. Consuming more than five or six peppers results in belching acid and throat burning. He has been experimenting with his diet to  identify foods that do not exacerbate his symptoms.  He reports constipation-like symptoms, describing bowel movements as infrequent and difficult, occurring once every two to four days. He notes a sensation of bloating and feeling as though he is carrying extra weight due to this issue.  He experiences episodes of dizziness and severe head pain, particularly when moving his neck in certain ways. These episodes are brief but intense, with pain reaching near-migraine levels. He notes that his blood pressure is generally low, around 97/65, but he does not associate these symptoms with his blood pressure levels.  He mentions a history of a minor fracture in his big toe that healed slightly crooked, resulting in an extra bone formation. This was identified via x-ray by his foot doctor. He does not recall the specific incident that caused the fracture, likely due to reduced sensation in his feet.          PHYSICAL EXAM:  Blood pressure 97/65, pulse 80, temperature 98.4 F (36.9 C), height 5\' 8"  (1.727 m), weight 244 lb (110.7 kg), SpO2 98%. Body mass index is 37.1 kg/m.  DIAGNOSTIC DATA REVIEWED:  BMET    Component Value Date/Time   NA 141 06/22/2023 1151   K 5.1 06/22/2023 1151   CL 103 06/22/2023 1151   CO2 22 06/22/2023 1151   GLUCOSE 80 06/22/2023 1151   GLUCOSE 123 (H) 06/25/2022 1621   BUN 26 (H) 06/22/2023 1151   CREATININE 1.24 06/22/2023 1151   CREATININE 1.02 08/24/2017 1458   CALCIUM 9.4 06/22/2023 1151   GFRNONAA >60 06/25/2022 1621   GFRNONAA >89 07/18/2015 1656  GFRAA 112 08/06/2020 0734   GFRAA >89 07/18/2015 1656   Lab Results  Component Value Date   HGBA1C 5.4 06/22/2023   HGBA1C 5.4 07/03/2011   Lab Results  Component Value Date   INSULIN 38.9 (H) 06/22/2023   INSULIN 110.6 (H) 09/15/2017   Lab Results  Component Value Date   TSH 2.310 09/01/2022   CBC    Component Value Date/Time   WBC 4.9 09/01/2022 0950   WBC 6.9 06/25/2022 1621   RBC 5.10  09/01/2022 0950   RBC 6.17 (H) 06/25/2022 1621   HGB 14.1 09/01/2022 0950   HCT 43.4 09/01/2022 0950   PLT 152 09/01/2022 0950   MCV 85 09/01/2022 0950   MCH 27.6 09/01/2022 0950   MCH 27.7 06/25/2022 1621   MCHC 32.5 09/01/2022 0950   MCHC 34.6 06/25/2022 1621   RDW 15.1 09/01/2022 0950   Iron Studies No results found for: "IRON", "TIBC", "FERRITIN", "IRONPCTSAT" Lipid Panel     Component Value Date/Time   CHOL 114 06/22/2023 1151   TRIG 130 06/22/2023 1151   HDL 40 06/22/2023 1151   CHOLHDL 3.4 08/24/2017 1458   VLDL 70 (H) 10/15/2015 1112   LDLCALC 51 06/22/2023 1151   LDLCALC 57 08/24/2017 1458   Hepatic Function Panel     Component Value Date/Time   PROT 6.2 06/22/2023 1151   ALBUMIN 4.2 06/22/2023 1151   AST 11 06/22/2023 1151   ALT 13 06/22/2023 1151   ALKPHOS 58 06/22/2023 1151   BILITOT 0.5 06/22/2023 1151   BILIDIR 0.1 04/11/2015 1638   IBILI 0.6 04/11/2015 1638      Component Value Date/Time   TSH 2.310 09/01/2022 0950   Nutritional Lab Results  Component Value Date   VD25OH 37.5 06/22/2023   VD25OH 54.6 02/23/2023   VD25OH 49.0 09/01/2022     Assessment and Plan    Obesity He has gained seven pounds over the last five weeks due to difficulty adhering to his eating plan and lack of exercise. He has a sugar addiction, consuming high-calorie foods like ice cream and chocolate mousse. Gastrointestinal issues, including difficulty processing foods like hot peppers and salads, may affect his dietary choices. He is learning to manage his dietary intake by identifying foods that exacerbate his symptoms. - Refill Ozempic prescription. - Encourage resumption of physical activity as tolerated. - Advise on dietary modifications focusing on antioxidant-rich foods. - Monitor gastrointestinal symptoms and adjust diet accordingly.  Type 2 Diabetes Mellitus His type 2 diabetes is well-controlled with a hemoglobin A1c of 5.4 as of December 2024. He is on Ozempic  and metformin XR 1000 mg BID, effectively managing his condition. - Continue Ozempic and metformin XR 1000 mg BID.  Hypertension His hypertension is well-controlled with current medications, with blood pressure readings around 97/65. He experiences dizziness unrelated to hypotension. An echocardiogram is needed, but previous attempts were unsuccessful due to difficulty in lowering heart rate without affecting blood pressure. Discussed potential strategy of temporarily discontinuing antihypertensive medications to facilitate echocardiogram, acknowledging the risks of increased blood pressure during this period. - Discuss with cardiologist about potential strategies for successful echocardiogram. - Consider temporary discontinuation of antihypertensive medications under supervision.  F/U 4 weeks         I have personally spent 40 minutes total time today in preparation, patient care, and documentation for this visit, including the following: review of clinical lab tests; review of medical tests/procedures/services.    He was informed of the importance of frequent follow up  visits to maximize his success with intensive lifestyle modifications for his multiple health conditions.    Quillian Quince, MD

## 2023-09-28 ENCOUNTER — Ambulatory Visit (INDEPENDENT_AMBULATORY_CARE_PROVIDER_SITE_OTHER): Payer: 59 | Admitting: Family Medicine

## 2023-09-28 ENCOUNTER — Encounter (INDEPENDENT_AMBULATORY_CARE_PROVIDER_SITE_OTHER): Payer: Self-pay | Admitting: Family Medicine

## 2023-09-28 VITALS — BP 96/61 | HR 80 | Temp 98.0°F | Ht 68.0 in | Wt 251.0 lb

## 2023-09-28 DIAGNOSIS — E669 Obesity, unspecified: Secondary | ICD-10-CM

## 2023-09-28 DIAGNOSIS — Z7984 Long term (current) use of oral hypoglycemic drugs: Secondary | ICD-10-CM

## 2023-09-28 DIAGNOSIS — K3184 Gastroparesis: Secondary | ICD-10-CM

## 2023-09-28 DIAGNOSIS — F439 Reaction to severe stress, unspecified: Secondary | ICD-10-CM

## 2023-09-28 DIAGNOSIS — Z7985 Long-term (current) use of injectable non-insulin antidiabetic drugs: Secondary | ICD-10-CM

## 2023-09-28 DIAGNOSIS — E1169 Type 2 diabetes mellitus with other specified complication: Secondary | ICD-10-CM

## 2023-09-28 DIAGNOSIS — E119 Type 2 diabetes mellitus without complications: Secondary | ICD-10-CM

## 2023-09-28 DIAGNOSIS — Z6838 Body mass index (BMI) 38.0-38.9, adult: Secondary | ICD-10-CM

## 2023-09-28 DIAGNOSIS — E559 Vitamin D deficiency, unspecified: Secondary | ICD-10-CM

## 2023-09-28 MED ORDER — SEMAGLUTIDE (1 MG/DOSE) 4 MG/3ML ~~LOC~~ SOPN: 1.0000 mg | PEN_INJECTOR | SUBCUTANEOUS | 0 refills | Status: DC

## 2023-09-28 NOTE — Progress Notes (Signed)
 Office: [phone]  /  Fax: [phone]  WEIGHT SUMMARY AND BIOMETRICS  Anthropometric Measurements Height: 5\' 8"  (1.727 m) Weight: 251 lb (113.9 kg) BMI (Calculated): 38.17 Weight at Last Visit: 244 lb Weight Lost Since Last Visit: 0 Weight Gained Since Last Visit: 7 lb Starting Weight: 305 lb Total Weight Loss (lbs): 54 lb (24.5 kg) Peak Weight: 315 lb   Body Composition  Body Fat %: 32.1 % Fat Mass (lbs): 80.6 lbs Muscle Mass (lbs): 162 lbs Total Body Water (lbs): 116.4 lbs Visceral Fat Rating : 19   Other Clinical Data Fasting: No Labs: No Today's Visit #: 77 Starting Date: 09/15/17    Chief Complaint: OBESITY   Discussed the use of AI scribe software for clinical note transcription with the patient, who gave verbal consent to proceed.  History of Present Illness Brandon [name] "[id number]" is a 53 year old male with obesity and type 2 diabetes who presents for obesity treatment plan assessment and diabetes management.  He has not been adhering well to his category four obesity treatment plan, following it only about 25% of the time, and has gained seven pounds in the last three weeks. His physical activity includes walking for five to ten minutes four times a week and going to the gym for thirty minutes one day a week. He describes a period of increased stress and comfort eating, particularly around his birthday and a court case, which led to a 'one month birthday' of indulgence. He hopes to return to his previous weight of the 240s by next month.  For his type 2 diabetes, he is currently taking metformin and Ozempic 1 mg. He describes his diabetes as being in remission, with a hemoglobin A1c of 5.1.   He mentions issues with gastroparesis, noting that his stomach feels slower but is moving better with the current medication, though he is unsure of the name. He experiences gaps between bowel movements but reports improvement. He also mentions a need to  improve his protein intake, which is complicated by his plasma donation schedule that restricts dairy consumption.  He discusses stress related to ongoing legal issues, which has contributed to his comfort eating. He describes a strategy of organizing his living space and engaging in video games to manage stress. He also mentions a new medication prescribed by a neurologist, possibly amitriptyline, to address symptoms related to brain plaques, including dizziness and balance issues.      PHYSICAL EXAM:  Blood pressure 96/61, pulse 80, temperature 98 F (36.7 C), height 5\' 8"  (1.727 m), weight 251 lb (113.9 kg), SpO2 98%. Body mass index is 38.16 kg/m.  DIAGNOSTIC DATA REVIEWED:  BMET    Component Value Date/Time   NA 141 06/22/2023 1151   K 5.1 06/22/2023 1151   CL 103 06/22/2023 1151   CO2 22 06/22/2023 1151   GLUCOSE 80 06/22/2023 1151   GLUCOSE 123 (H) 06/25/2022 1621   BUN 26 (H) 06/22/2023 1151   CREATININE 1.24 06/22/2023 1151   CREATININE 1.02 08/24/2017 1458   CALCIUM 9.4 06/22/2023 1151   GFRNONAA >60 06/25/2022 1621   GFRNONAA >89 07/18/2015 1656   GFRAA 112 08/06/2020 0734   GFRAA >89 07/18/2015 1656   Lab Results  Component Value Date   HGBA1C 5.4 06/22/2023   HGBA1C 5.4 07/03/2011   Lab Results  Component Value Date   INSULIN 38.9 (H) 06/22/2023   INSULIN 110.6 (H) 09/15/2017   Lab Results  Component Value Date   TSH 2.310 09/01/2022  CBC    Component Value Date/Time   WBC 4.9 09/01/2022 0950   WBC 6.9 06/25/2022 1621   RBC 5.10 09/01/2022 0950   RBC 6.17 (H) 06/25/2022 1621   HGB 14.1 09/01/2022 0950   HCT 43.4 09/01/2022 0950   PLT 152 09/01/2022 0950   MCV 85 09/01/2022 0950   MCH 27.6 09/01/2022 0950   MCH 27.7 06/25/2022 1621   MCHC 32.5 09/01/2022 0950   MCHC 34.6 06/25/2022 1621   RDW 15.1 09/01/2022 0950   Iron Studies No results found for: "IRON", "TIBC", "FERRITIN", "IRONPCTSAT" Lipid Panel     Component Value Date/Time    CHOL 114 06/22/2023 1151   TRIG 130 06/22/2023 1151   HDL 40 06/22/2023 1151   CHOLHDL 3.4 08/24/2017 1458   VLDL 70 (H) 10/15/2015 1112   LDLCALC 51 06/22/2023 1151   LDLCALC 57 08/24/2017 1458   Hepatic Function Panel     Component Value Date/Time   PROT 6.2 06/22/2023 1151   ALBUMIN 4.2 06/22/2023 1151   AST 11 06/22/2023 1151   ALT 13 06/22/2023 1151   ALKPHOS 58 06/22/2023 1151   BILITOT 0.5 06/22/2023 1151   BILIDIR 0.1 04/11/2015 1638   IBILI 0.6 04/11/2015 1638      Component Value Date/Time   TSH 2.310 09/01/2022 0950   Nutritional Lab Results  Component Value Date   VD25OH 37.5 06/22/2023   VD25OH 54.6 02/23/2023   VD25OH 49.0 09/01/2022     Assessment and Plan Assessment & Plan Obesity He is struggling with adherence to his weight management plan, following it only 25% of the time. He has gained 7 pounds in the last three weeks due to stress eating and insufficient exercise. He is aware of his dietary indiscretions and is attempting to regain control. He engages in some physical activity, including walking and gym visits, but these are not sufficient to counteract his caloric intake. He acknowledges the need to improve protein intake and manage stress to aid weight loss. - Encourage adherence to weight management plan - Increase physical activity - Monitor weight regularly - Improve protein intake - Manage stress to reduce stress eating  Type 2 Diabetes Mellitus His type 2 diabetes is well-controlled with a hemoglobin A1c of 5.1. He is on metformin and Ozempic 1 mg. Despite recent dietary lapses, his blood sugar levels remain stable. He is aware of the importance of maintaining his medication regimen and dietary control to prevent exacerbation of his condition. The use of Ozempic is monitored due to his gastroparesis, but it is currently deemed appropriate by his GI specialist. - Continue metformin and Ozempic 1 mg - Monitor blood glucose levels regularly -  Consult with GI specialist if gastroparesis symptoms worsen  Gastroparesis He reports improvement in gastroparesis symptoms with current medication, although he is unsure of the specific medication name. He experiences slower gastric emptying but notes that it is manageable. This was caused by his ADEM. The use of Ozempic is being monitored due to potential effects on gastric motility. His GI doctor specifically told him it was ok to continue his Ozempic in this specific situation. - Continue current medication for gastroparesis - Monitor symptoms and report any changes - Evaluate the impact of Ozempic on gastroparesis, will monitor monthly and he can   Stress Management He is experiencing significant stress related to legal issues and personal circumstances, contributing to stress eating and weight gain. He is employing strategies such as organizing his living space and engaging in hobbies to  manage stress. He acknowledges the need to balance distractions with addressing underlying stressors. Legal proceedings are ongoing. - Encourage continued use of stress management techniques - Consider referral to mental health support if needed  Vitamin D Deficiency He has vitamin D deficiency, with a recent level of 31 ng/mL. He has an adequate supply of vitamin D supplements and is working through a backlog of doses. The goal is to increase his levels to the 50s. - Continue vitamin D supplementation - Recheck vitamin D levels in upcoming labs     I have personally spent 45 minutes total time today in preparation, patient care, and documentation for this visit, including the following: review of clinical lab tests; review of medical tests/procedures/services.    He was informed of the importance of frequent follow up visits to maximize his success with intensive lifestyle modifications for his multiple health conditions.    Quillian Quince, MD

## 2023-10-11 ENCOUNTER — Other Ambulatory Visit: Payer: Self-pay

## 2023-10-11 ENCOUNTER — Encounter (HOSPITAL_BASED_OUTPATIENT_CLINIC_OR_DEPARTMENT_OTHER): Payer: Self-pay | Admitting: Emergency Medicine

## 2023-10-11 ENCOUNTER — Emergency Department (HOSPITAL_BASED_OUTPATIENT_CLINIC_OR_DEPARTMENT_OTHER): Admitting: Radiology

## 2023-10-11 ENCOUNTER — Emergency Department (HOSPITAL_BASED_OUTPATIENT_CLINIC_OR_DEPARTMENT_OTHER)
Admission: EM | Admit: 2023-10-11 | Discharge: 2023-10-11 | Disposition: A | Discharge status: Home / Self Care | Attending: Emergency Medicine | Admitting: Emergency Medicine

## 2023-10-11 DIAGNOSIS — I10 Essential (primary) hypertension: Secondary | ICD-10-CM | POA: Insufficient documentation

## 2023-10-11 DIAGNOSIS — Z7982 Long term (current) use of aspirin: Secondary | ICD-10-CM | POA: Insufficient documentation

## 2023-10-11 DIAGNOSIS — Z87891 Personal history of nicotine dependence: Secondary | ICD-10-CM | POA: Diagnosis not present

## 2023-10-11 DIAGNOSIS — E1142 Type 2 diabetes mellitus with diabetic polyneuropathy: Secondary | ICD-10-CM | POA: Diagnosis not present

## 2023-10-11 DIAGNOSIS — Z794 Long term (current) use of insulin: Secondary | ICD-10-CM | POA: Insufficient documentation

## 2023-10-11 DIAGNOSIS — Z7984 Long term (current) use of oral hypoglycemic drugs: Secondary | ICD-10-CM | POA: Diagnosis not present

## 2023-10-11 DIAGNOSIS — M79674 Pain in right toe(s): Secondary | ICD-10-CM | POA: Diagnosis present

## 2023-10-11 MED ORDER — OXYCODONE-ACETAMINOPHEN 5-325 MG PO TABS
1.0000 | ORAL_TABLET | Freq: Once | ORAL | Status: AC
  Administered 2023-10-11: 1 via ORAL
  Filled 2023-10-11: qty 1

## 2023-10-11 MED ORDER — OXYCODONE-ACETAMINOPHEN 5-325 MG PO TABS: 1.0000 | ORAL_TABLET | Freq: Four times a day (QID) | ORAL | 0 refills | Status: AC | PRN

## 2023-10-11 NOTE — ED Triage Notes (Signed)
 C/o R foot/Great toe pain. Area is swollen. Denies injuries.

## 2023-10-11 NOTE — Discharge Instructions (Addendum)
 We evaluated your toe pain.  Your x-ray did not show any signs of an infection or fracture.  Please continue to ice and elevate your toe and foot.  Please follow-up with your foot specialist.  We have prescribed you a short course of oxycodone.  Please do not drive or drink alcohol while taking this medication.  If you notice any new or worsening symptoms such as redness, increasing pain, fevers, wounds on your toe, or any other new symptoms, please return to the emergency department.

## 2023-10-11 NOTE — ED Provider Notes (Signed)
 [place] EMERGENCY DEPARTMENT AT [facility] Provider Note  CSN: [id number] Arrival date & time: 10/11/23 4098  Chief Complaint(s) Foot Pain  HPI Brandon [name] is a 53 y.o. adult history of obesity, diabetes presenting to the emergency department with toe pain.  Patient reports pain to his right great toe.  Unsure if injured it in some way.  No wounds.  No fevers or chills.  Took Mobic and gabapentin without significant improvement.  No other new symptoms.  Had an episode of pain around 3 weeks ago and saw foot doctor who said it could be due to an old fracture   Past Medical History Past Medical History:  Diagnosis Date   ADEM (acute disseminated encephalomyelitis) 2014   dx 2014  symptoms started at age 8,  tremors, gait disturbance, weakness (previous seen by neurologist-- dr Royal Piedra [facility])   Adhesive capsulitis of left shoulder    Ambulates with cane    Ambulates with cane    Anxiety 2005   Chicken pox    age 38   Chronic constipation    when on opioids   Complication of anesthesia    Depression 2005   Difficult intubation    "throat spasm and clinched endo tube 2013": cervical neck sx 2018 and 2019 with no anesthesia problems   Dyspnea    with exertion   ED (erectile dysfunction)    Headache(784.0)    Hearing loss    both ears   History of transient ischemic attack (TIA) 2000   no residual   Hyperlipidemia    Hypertension    Mono exposure 2013   swollen liver also 2013   OSA on CPAP    cpap autoset to 18 max   Skin abnormalities    dry patches pt does not know name of skin problem   Type 2 diabetes mellitus (HCC)    pancreas makes too much insulin per pt   Patient Active Problem List   Diagnosis Date Noted   Lack of food as cause of insufficient nourishment 09/29/2022   Mixed hyperlipidemia 09/01/2022   Transgender person on hormone therapy 09/01/2022   BMI 36.0-36.9,adult 09/01/2022   Obesity, Beginning BMI 46.38 09/01/2022   Constipation by  delayed colonic transit 08/04/2022   BMI 38.0-38.9,adult 08/04/2022   Obesity, Beginning BMI 46.38 08/04/2022   Stress 06/01/2022   Vitamin D deficiency 06/04/2021   Depression with anxiety 04/13/2019   Class 2 severe obesity with serious comorbidity and body mass index (BMI) of 38.0 to 38.9 in adult (HCC) 12/28/2018   Chronic neck pain 02/17/2018   Other fatigue 09/15/2017   Shortness of breath on exertion 09/15/2017   Type 2 diabetes mellitus without complication (HCC) 09/15/2017   Other hyperlipidemia 09/15/2017   Essential hypertension 09/15/2017   DDD (degenerative disc disease), cervical 05/03/2017   Cervical herniated disc 10/21/2016   Fatty liver 09/22/2016   Spinal stenosis in cervical region 08/05/2016   Diabetes type 2, controlled (HCC) 10/15/2015   Hypertriglyceridemia 10/15/2015   Benign essential HTN 12/05/2014   Macroglossia 09/05/2014   GAD (generalized anxiety disorder) 12/11/2013   Motor tic disorder 12/11/2013   OSA (obstructive sleep apnea) 10/04/2013   Hypersomnia with sleep apnea 10/04/2013   High frequency hearing loss 08/24/2013   Peripheral neuropathy 04/19/2013   Abnormal gait 10/19/2012   Cervical myelopathy (HCC) 10/19/2012   Esophageal reflux 07/27/2011   Morbid obesity due to excess calories (HCC) 07/22/2011   ADD (attention deficit disorder) 07/22/2011   ADEM (  acute disseminated encephalomyelitis) 07/03/2011   Splenomegaly 07/03/2011   Elevated LFTs 07/03/2011   Home Medication(s) Prior to Admission medications   Medication Sig Start Date End Date Taking? Authorizing Provider  oxyCODONE-acetaminophen (PERCOCET/ROXICET) 5-325 MG tablet Take 1 tablet by mouth every 6 (six) hours as needed for severe pain (pain score 7-10). 10/11/23  Yes Lonell Grandchild, MD  ASPIRIN LOW DOSE 81 MG tablet Take 81 mg by mouth daily.    [provider]  atorvastatin (LIPITOR) 20 MG tablet TAKE 1 TABLET (20 MG TOTAL) BY MOUTH AT BEDTIME. Patient taking  differently: Take 20 mg by mouth at bedtime. 03/10/18   Weber, Dema Severin, PA-C  chlorthalidone (HYGROTON) 25 MG tablet Take 25 mg by mouth at bedtime. 07/28/21   [provider]  cloNIDine (CATAPRES) 0.1 MG tablet Take 0.1 mg by mouth 3 (three) times daily.    [provider]  colesevelam (WELCHOL) 625 MG tablet Take 3 tablets (1,875 mg total) by mouth daily with breakfast. Patient taking differently: Take 1,875 mg by mouth 2 (two) times daily with a meal. 02/28/18   Weber, Dema Severin, PA-C  cyclobenzaprine (FLEXERIL) 10 MG tablet Take 1 tablet (10 mg total) by mouth 3 (three) times daily as needed. 01/20/22   Chadwell, Ivin Booty, PA-C  estradiol (ESTRACE) 2 MG tablet Take by mouth. 02/02/23   [provider]  estradiol (VIVELLE-DOT) 0.05 MG/24HR patch Place 1 patch onto the skin 2 (two) times a week. 06/15/22   [provider]  gabapentin (NEURONTIN) 300 MG capsule Take 3 capsules (900 mg total) by mouth 2 (two) times daily. 09/01/22   Quillian Quince D, MD  Insulin Pen Needle (BD PEN NEEDLE NANO U/F) 32G X 4 MM MISC 1 Package by Does not apply route 2 (two) times daily. 03/31/21   Quillian Quince D, MD  ivabradine (CORLANOR) 7.5 MG TABS tablet Take 2 tablets (15mg ) TWO hours prior to your cardiac CT scan. 07/14/22   Corky Crafts, MD  ketoconazole (NIZORAL) 2 % cream Apply topically 2 (two) times daily. 10/27/21   [provider]  lisinopril (ZESTRIL) 2.5 MG tablet Take by mouth. 02/02/23   [provider]  meloxicam (MOBIC) 7.5 MG tablet Take 7.5 mg by mouth 2 (two) times daily. 11/15/21   [provider]  metFORMIN (GLUCOPHAGE-XR) 500 MG 24 hr tablet TAKE 2 TABLETS (1,000 MG TOTAL) BY MOUTH 2 (TWO) TIMES DAILY. Patient taking differently: Take 1,000 mg by mouth 2 (two) times daily. TAKE 2 TABLETS (1,000 MG TOTAL) BY MOUTH 2 (TWO) TIMES DAILY. 03/25/18   Georgina Quint, MD  metoprolol tartrate (LOPRESSOR) 100 MG tablet Take one tablet by mouth 2  hours prior to CT Scan 07/14/22   Corky Crafts, MD  NON FORMULARY CPAP machine at bedtime & sleep    [provider]  ondansetron (ZOFRAN) 4 MG tablet Take 1 tablet (4 mg total) by mouth every 8 (eight) hours as needed for nausea or vomiting. 01/20/22   Chadwell, Ivin Booty, PA-C  OVER THE COUNTER MEDICATION 2 (two) times daily. Docusate sodium 250 mg 2 tabs bid    [provider]  polyethylene glycol powder (GLYCOLAX/MIRALAX) 17 GM/SCOOP powder Take 17 g by mouth daily. 08/04/22   Quillian Quince D, MD  potassium chloride (KLOR-CON) 10 MEQ tablet Take 10 mEq by mouth every evening. 11/27/21   [provider]  Semaglutide, 1 MG/DOSE, 4 MG/3ML SOPN Inject 1 mg as directed once a week. 09/28/23   Quillian Quince  D, MD  spironolactone (ALDACTONE) 50 MG tablet Take 50 mg by mouth 2 (two) times daily. 06/15/22   [provider]  tadalafil (CIALIS) 10 MG tablet Take 10 mg by mouth as needed. 11/17/21   [provider]  Vitamin D, Ergocalciferol, (DRISDOL) 1.25 MG (50000 UNIT) CAPS capsule Take 1 capsule (50,000 Units total) by mouth every 7 (seven) days. 06/22/23   Wilder Glade, MD                                                                                                                                    Past Surgical History Past Surgical History:  Procedure Laterality Date   ANTERIOR CERVICAL DECOMP/DISCECTOMY FUSION N/A 10/21/2016   Procedure: ANTERIOR CERVICAL DECOMPRESSION/DISCECTOMY FUSION, INTERBODY PROSTHESIS,PLATE CERVICAL FOUR- CERVICAL FIVE, CERVICAL FIVE- CERVICAL SIX;  Surgeon: Tressie Stalker, MD;  Location: [facility] OR;  Service: Neurosurgery;  Laterality: N/A;   ANTERIOR CERVICAL DECOMP/DISCECTOMY FUSION N/A 05/19/2018   Procedure: ANTERIOR CERVICAL DECOMPRESSION/DISCECTOMY FUSION, INTERBODY PROSTHESIS,PLATE/SCREWS CERVICAL SIX- CERVICAL SEVEN; EXPLORE FUSION;  Surgeon: Tressie Stalker, MD;  [facility];  Service: Neurosurgery;  Laterality:  N/A;   CHOLECYSTECTOMY  08/02/2011   Procedure: LAPAROSCOPIC CHOLECYSTECTOMY;  Surgeon: Shelly Rubenstein, MD;  Location: [facility] OR;  Service: General;;   COLONOSCOPY  10/2020   ESOPHAGOGASTRODUODENOSCOPY  07/28/2011   Procedure: ESOPHAGOGASTRODUODENOSCOPY (EGD);  Surgeon: [name], MD;  Location: [facility] ENDOSCOPY;  Service: Endoscopy;  Laterality: N/A;   SHOULDER CLOSED REDUCTION Left 01/20/2022   Procedure: CLOSED MANIPULATION SHOULDER;  Surgeon: Sheral Apley, MD;  Location: [facility];  Service: Orthopedics;  Laterality: Left;   TONSILECTOMY/ADENOIDECTOMY WITH MYRINGOTOMY Bilateral 1981   TYMPANOSTOMY TUBE PLACEMENT Bilateral 1979   Family History Family History  Problem Relation Age of Onset   Diabetes Mother    Obesity Mother    Coronary artery disease Father    Stroke Father    Cancer Father    High blood pressure Father    Alcoholism Father    Cancer Maternal Grandmother        bone   Cancer Other        Stomach -Great-great grandmother   Sleep apnea Neg Hx     Social History Social History   Tobacco Use   Smoking status: Former    Current packs/day: 0.00    Average packs/day: 3.0 packs/day for 3.0 years (9.0 ttl pk-yrs)    Types: Cigarettes    Start date: 08/17/1984    Quit date: 08/18/1987    Years since quitting: 36.1   Smokeless tobacco: Never  Vaping Use   Vaping status: Never Used  Substance Use Topics   Alcohol use: Not Currently    Comment: very infrequent   Drug use: Not Currently    Types: Marijuana    Comment: mairjuana last used 2013   Allergies Amoxicillin, Penicillin g, Sulfa antibiotics, Cephalosporins, Quinolones, and Sulfamethoxazole  Review of Systems Review  of Systems  All other systems reviewed and are negative.   Physical Exam Vital Signs  I have reviewed the triage vital signs BP 124/67 (BP Location: Right Arm)   Pulse 75   Temp 98.3 F (36.8 C) (Oral)   Resp 20   SpO2 98%  Physical Exam Vitals and nursing  note reviewed.  Constitutional:      General: He is not in acute distress.    Appearance: Normal appearance.  HENT:     Head: Normocephalic and atraumatic.     Mouth/Throat:     Mouth: Mucous membranes are moist.  Eyes:     Conjunctiva/sclera: Conjunctivae normal.  Cardiovascular:     Rate and Rhythm: Normal rate.  Pulmonary:     Effort: Pulmonary effort is normal. No respiratory distress.  Abdominal:     General: Abdomen is flat.  Musculoskeletal:     Comments: Right great toe with tenderness over the MTP and PIP joint.  No obvious swelling, erythema, warmth, wound.  No sign of any ingrown toenail.  2+ DP pulse.  No swelling to the foot.  Skin:    General: Skin is warm and dry.     Capillary Refill: Capillary refill takes less than 2 seconds.  Neurological:     General: No focal deficit present.     Mental Status: He is alert. Mental status is at baseline.  Psychiatric:        Mood and Affect: Mood normal.        Behavior: Behavior normal.     ED Results and Treatments Labs (all labs ordered are listed, but only abnormal results are displayed) Labs Reviewed - No data to display                                                                                                                        Radiology DG Toe Great Right Result Date: 10/11/2023 CLINICAL DATA:  pain in toe.  Swelling.  No known injuries. EXAM: RIGHT GREAT TOE COMPARISON:  None Available. FINDINGS: No acute fracture or dislocation. No aggressive osseous lesion. No significant arthritis of imaged joints. No focal soft tissue swelling. No focal soft tissue defect.  No air within the soft tissue. No radiopaque foreign bodies. IMPRESSION: *No acute osseous abnormality of the right great toe. Electronically Signed   By: Jules Schick M.D.   On: 10/11/2023 09:23    Pertinent labs & imaging results that were available during my care of the patient were reviewed by me and considered in my medical decision making (see  MDM for details).  Medications Ordered in ED Medications  oxyCODONE-acetaminophen (PERCOCET/ROXICET) 5-325 MG per tablet 1 tablet (1 tablet Oral Given 10/11/23 0911)  Procedures Procedures  (including critical care time)  Medical Decision Making / ED Course   MDM:  53 year old presenting to the emergency department with toe pain.  Patient unsure of trauma.  Examination with tenderness but otherwise no focal findings such as swelling, warmth, erythema, wound, ingrown toenail.  Will obtain x-ray.  Unclear cause.  Low concern for infection.  Circulatory issue.  No obvious swelling to suggest gout.  Will reassess.     Imaging Studies ordered: I ordered imaging studies including XR toe  On my interpretation imaging demonstrates no acute process I independently visualized and interpreted imaging. I agree with the radiologist interpretation   Medicines ordered and prescription drug management: Meds ordered this encounter  Medications   oxyCODONE-acetaminophen (PERCOCET/ROXICET) 5-325 MG per tablet 1 tablet    Refill:  0   oxyCODONE-acetaminophen (PERCOCET/ROXICET) 5-325 MG tablet    Sig: Take 1 tablet by mouth every 6 (six) hours as needed for severe pain (pain score 7-10).    Dispense:  8 tablet    Refill:  0    -I have reviewed the patients home medicines and have made adjustments as needed   Social Determinants of Health:  Diagnosis or treatment significantly limited by social determinants of health: obesity   Reevaluation: After the interventions noted above, I reevaluated the patient and found that their symptoms have improved  Co morbidities that complicate the patient evaluation  Past Medical History:  Diagnosis Date   ADEM (acute disseminated encephalomyelitis) 2014   dx 2014  symptoms started at age 30,  tremors, gait disturbance,  weakness (previous seen by neurologist-- dr Royal Piedra [name])   Adhesive capsulitis of left shoulder    Ambulates with cane    Ambulates with cane    Anxiety 2005   Chicken pox    age 10   Chronic constipation    when on opioids   Complication of anesthesia    Depression 2005   Difficult intubation    "throat spasm and clinched endo tube 2013": cervical neck sx 2018 and 2019 with no anesthesia problems   Dyspnea    with exertion   ED (erectile dysfunction)    Headache(784.0)    Hearing loss    both ears   History of transient ischemic attack (TIA) 2000   no residual   Hyperlipidemia    Hypertension    Mono exposure 2013   swollen liver also 2013   OSA on CPAP    cpap autoset to 18 max   Skin abnormalities    dry patches pt does not know name of skin problem   Type 2 diabetes mellitus (HCC)    pancreas makes too much insulin per pt      Dispostion: Disposition decision including need for hospitalization was considered, and patient discharged from emergency department.    Final Clinical Impression(s) / ED Diagnoses Final diagnoses:  Pain of right great toe     This chart was dictated using voice recognition software.  Despite best efforts to proofread,  errors can occur which can change the documentation meaning.    Lonell Grandchild, MD 10/11/23 1447

## 2023-10-26 ENCOUNTER — Ambulatory Visit (INDEPENDENT_AMBULATORY_CARE_PROVIDER_SITE_OTHER): Payer: 59 | Admitting: Family Medicine

## 2023-10-26 ENCOUNTER — Encounter (INDEPENDENT_AMBULATORY_CARE_PROVIDER_SITE_OTHER): Payer: Self-pay | Admitting: Family Medicine

## 2023-11-23 ENCOUNTER — Ambulatory Visit (INDEPENDENT_AMBULATORY_CARE_PROVIDER_SITE_OTHER): Admitting: Family Medicine

## 2023-11-23 ENCOUNTER — Encounter (INDEPENDENT_AMBULATORY_CARE_PROVIDER_SITE_OTHER): Payer: Self-pay | Admitting: Family Medicine

## 2023-11-23 VITALS — BP 103/67 | HR 73 | Temp 98.1°F | Ht 68.0 in | Wt 254.0 lb

## 2023-11-23 DIAGNOSIS — E66813 Obesity, class 3: Secondary | ICD-10-CM

## 2023-11-23 DIAGNOSIS — Z6838 Body mass index (BMI) 38.0-38.9, adult: Secondary | ICD-10-CM | POA: Diagnosis not present

## 2023-11-23 DIAGNOSIS — E538 Deficiency of other specified B group vitamins: Secondary | ICD-10-CM | POA: Diagnosis not present

## 2023-11-23 DIAGNOSIS — E669 Obesity, unspecified: Secondary | ICD-10-CM | POA: Diagnosis not present

## 2023-11-23 DIAGNOSIS — Z7985 Long-term (current) use of injectable non-insulin antidiabetic drugs: Secondary | ICD-10-CM

## 2023-11-23 DIAGNOSIS — E1169 Type 2 diabetes mellitus with other specified complication: Secondary | ICD-10-CM

## 2023-11-23 DIAGNOSIS — E119 Type 2 diabetes mellitus without complications: Secondary | ICD-10-CM

## 2023-11-23 DIAGNOSIS — F439 Reaction to severe stress, unspecified: Secondary | ICD-10-CM

## 2023-11-23 MED ORDER — SEMAGLUTIDE (1 MG/DOSE) 4 MG/3ML ~~LOC~~ SOPN: PEN_INJECTOR | SUBCUTANEOUS | 0 refills | Status: DC

## 2023-11-23 MED ORDER — VITAMIN B-12 1000 MCG PO TABS
1000.0000 ug | ORAL_TABLET | Freq: Every day | ORAL | 0 refills | Status: DC
Start: 2023-11-23 — End: 2024-01-31

## 2023-11-23 NOTE — Progress Notes (Signed)
 Office: [phone]  /  Fax: [phone]  WEIGHT SUMMARY AND BIOMETRICS  Anthropometric Measurements Height: 5\' 8"  (1.727 m) Weight: 254 lb (115.2 kg) BMI (Calculated): 38.63 Weight at Last Visit: 251 lb Weight Lost Since Last Visit: 0 Weight Gained Since Last Visit: 3 lb Starting Weight: 305 lb Total Weight Loss (lbs): 51 lb (23.1 kg) Peak Weight: 315 lb   Body Composition  Body Fat %: 31.6 % Fat Mass (lbs): 80.2 lbs Muscle Mass (lbs): 165.2 lbs Total Body Water (lbs): 112 lbs Visceral Fat Rating : 19   Other Clinical Data Fasting: yes Labs: no Today's Visit #: 24 Starting Date: 09/15/17    Chief Complaint: OBESITY   History of Present Illness Brandon [name] "[id number]" is a 53 year old who presents for obesity treatment and progress assessment.  He has been following the category four plan for obesity management but has adhered to it only about 25% of the time. He is currently walking five minutes a day, three times a week. Despite these efforts, he has gained three pounds over the last two months. Significant stressors, including a legal deposition, have impacted his ability to manage his weight. He uses sugar as a coping mechanism for stress, which led to a weight peak of 260 pounds. He is working to control his sugar intake by avoiding keeping sugary foods in the house.  He has a history of type 2 diabetes and is currently on Ozempic . The medication does not curb his hunger as effectively as it used to, possibly due to a side effect of another medication he is taking for gastroparesis. He is exploring ways to manage his hunger, focusing on protein intake. He has been using cold cuts for quick meals and is trying to increase his protein levels, as he has been found to have low protein levels during plasma donation screenings.  He experiences stress-related eating, particularly with sugar, which he describes as an addiction. He is trying to manage this by  substituting with fruit and fruit-based snacks. He also has a history of insomnia, which he is mindful of when considering supplements like B12, as he has low levels.  His B12 levels have dropped from 480 to 304. He is also managing his vitamin D  intake, although he occasionally forgets to take it.      PHYSICAL EXAM:  Blood pressure 103/67, pulse 73, temperature 98.1 F (36.7 C), height 5\' 8"  (1.727 m), weight 254 lb (115.2 kg), SpO2 99%. Body mass index is 38.62 kg/m.  DIAGNOSTIC DATA REVIEWED:  BMET    Component Value Date/Time   NA 141 06/22/2023 1151   K 5.1 06/22/2023 1151   CL 103 06/22/2023 1151   CO2 22 06/22/2023 1151   GLUCOSE 80 06/22/2023 1151   GLUCOSE 123 (H) 06/25/2022 1621   BUN 26 (H) 06/22/2023 1151   CREATININE 1.24 06/22/2023 1151   CREATININE 1.02 08/24/2017 1458   CALCIUM  9.4 06/22/2023 1151   GFRNONAA >60 06/25/2022 1621   GFRNONAA >89 07/18/2015 1656   GFRAA 112 08/06/2020 0734   GFRAA >89 07/18/2015 1656   Lab Results  Component Value Date   HGBA1C 5.4 06/22/2023   HGBA1C 5.4 07/03/2011   Lab Results  Component Value Date   INSULIN  38.9 (H) 06/22/2023   INSULIN  110.6 (H) 09/15/2017   Lab Results  Component Value Date   TSH 2.310 09/01/2022   CBC    Component Value Date/Time   WBC 4.9 09/01/2022 0950   WBC 6.9 06/25/2022  1621   RBC 5.10 09/01/2022 0950   RBC 6.17 (H) 06/25/2022 1621   HGB 14.1 09/01/2022 0950   HCT 43.4 09/01/2022 0950   PLT 152 09/01/2022 0950   MCV 85 09/01/2022 0950   MCH 27.6 09/01/2022 0950   MCH 27.7 06/25/2022 1621   MCHC 32.5 09/01/2022 0950   MCHC 34.6 06/25/2022 1621   RDW 15.1 09/01/2022 0950   Iron Studies No results found for: "IRON", "TIBC", "FERRITIN", "IRONPCTSAT" Lipid Panel     Component Value Date/Time   CHOL 114 06/22/2023 1151   TRIG 130 06/22/2023 1151   HDL 40 06/22/2023 1151   CHOLHDL 3.4 08/24/2017 1458   VLDL 70 (H) 10/15/2015 1112   LDLCALC 51 06/22/2023 1151   LDLCALC 57  08/24/2017 1458   Hepatic Function Panel     Component Value Date/Time   PROT 6.2 06/22/2023 1151   ALBUMIN 4.2 06/22/2023 1151   AST 11 06/22/2023 1151   ALT 13 06/22/2023 1151   ALKPHOS 58 06/22/2023 1151   BILITOT 0.5 06/22/2023 1151   BILIDIR 0.1 04/11/2015 1638   IBILI 0.6 04/11/2015 1638      Component Value Date/Time   TSH 2.310 09/01/2022 0950   Nutritional Lab Results  Component Value Date   VD25OH 37.5 06/22/2023   VD25OH 54.6 02/23/2023   VD25OH 49.0 09/01/2022     Assessment and Plan Assessment & Plan Type 2 diabetes mellitus Current management with Ozempic  1 mg is insufficient for hunger control due to gastroparesis medication. Stress and dietary habits are affecting glycemic control. - Increase Ozempic  dose to 1.25 mg by adjusting to 17 clicks above 1 mg. - Emphasize protein intake to support plasma donation and overall health.  Obesity Weight gain of 3 pounds over two months. Adherence to category four plan is 25% due to stress-related eating, particularly sugar. Limited exercise to walking five minutes, three times weekly. Plans to increase protein intake for weight management and plasma donation. - Continue category four plan with increased protein intake. - Encourage exercise at [employer] twice weekly. - Eliminate unhealthy snacks and focus on fruit sugars.  Vitamin B12 deficiency Vitamin B12 levels are decreasing, currently at 304 pg/mL, previously 480 pg/mL and 422 pg/mL. Symptoms may be exacerbated by deficiency. Discussed B12's impact on neurological health. He  can benefit from oral supplementation in addition to his B12 rich diet - Prescribe B12 1000 mcg daily. - Advise morning administration to avoid insomnia.   He was informed of the importance of frequent follow up visits to maximize his success with intensive lifestyle modifications for his multiple health conditions.    Brandon Mesi, MD

## 2023-12-27 LAB — HEPATIC FUNCTION PANEL
ALT: 94 U/L
ALT: 94 U/L
AST: 35
AST: 35
Alkaline Phosphatase: 58 (ref 25–125)
Alkaline Phosphatase: 58 (ref 25–125)
Bilirubin, Total: 0.4
Bilirubin, Total: 0.4

## 2023-12-27 LAB — BASIC METABOLIC PANEL WITH GFR
BUN: 19 (ref 4–21)
BUN: 19 (ref 4–21)
CO2: 23 — AB (ref 13–22)
CO2: 23 — AB (ref 13–22)
Chloride: 102 (ref 99–108)
Chloride: 102 (ref 99–108)
Creatinine: 1.1
Creatinine: 1.1
Glucose: 77
Glucose: 77
Potassium: 3.9 meq/L (ref 3.5–5.1)
Potassium: 3.9 meq/L (ref 3.5–5.1)
Sodium: 139 (ref 137–147)
Sodium: 139 (ref 137–147)

## 2023-12-27 LAB — CBC: RBC: 5.27 — AB (ref 3.87–5.11)

## 2023-12-27 LAB — COMPREHENSIVE METABOLIC PANEL WITH GFR
Albumin: 3.9 (ref 3.5–5.0)
Albumin: 3.9 (ref 3.5–5.0)
Calcium: 9.3 (ref 8.7–10.7)
Calcium: 9.3 (ref 8.7–10.7)
Globulin: 2
Globulin: 2
eGFR: 80

## 2023-12-27 LAB — CBC AND DIFFERENTIAL
HCT: 45 (ref 39–52)
Hemoglobin: 15 (ref 13.0–17.0)
Platelets: 168 K/uL (ref 150–400)
WBC: 5.8

## 2024-01-31 ENCOUNTER — Encounter (INDEPENDENT_AMBULATORY_CARE_PROVIDER_SITE_OTHER): Payer: Self-pay | Admitting: Family Medicine

## 2024-01-31 ENCOUNTER — Ambulatory Visit (INDEPENDENT_AMBULATORY_CARE_PROVIDER_SITE_OTHER): Admitting: Family Medicine

## 2024-01-31 VITALS — BP 107/72 | HR 84 | Temp 98.0°F | Ht 68.0 in | Wt 256.0 lb

## 2024-01-31 DIAGNOSIS — E538 Deficiency of other specified B group vitamins: Secondary | ICD-10-CM

## 2024-01-31 DIAGNOSIS — E669 Obesity, unspecified: Secondary | ICD-10-CM

## 2024-01-31 DIAGNOSIS — M25512 Pain in left shoulder: Secondary | ICD-10-CM

## 2024-01-31 DIAGNOSIS — E559 Vitamin D deficiency, unspecified: Secondary | ICD-10-CM | POA: Diagnosis not present

## 2024-01-31 DIAGNOSIS — Z6838 Body mass index (BMI) 38.0-38.9, adult: Secondary | ICD-10-CM

## 2024-01-31 DIAGNOSIS — E119 Type 2 diabetes mellitus without complications: Secondary | ICD-10-CM

## 2024-01-31 DIAGNOSIS — E1169 Type 2 diabetes mellitus with other specified complication: Secondary | ICD-10-CM

## 2024-01-31 DIAGNOSIS — Z7985 Long-term (current) use of injectable non-insulin antidiabetic drugs: Secondary | ICD-10-CM

## 2024-01-31 MED ORDER — SEMAGLUTIDE (1 MG/DOSE) 4 MG/3ML ~~LOC~~ SOPN: PEN_INJECTOR | SUBCUTANEOUS | 0 refills | Status: DC

## 2024-01-31 MED ORDER — VITAMIN D (ERGOCALCIFEROL) 1.25 MG (50000 UNIT) PO CAPS: 50000.0000 [IU] | ORAL_CAPSULE | ORAL | 0 refills | Status: DC

## 2024-01-31 MED ORDER — VITAMIN B-12 1000 MCG PO TABS: 1000.0000 ug | ORAL_TABLET | Freq: Every day | ORAL | 0 refills | Status: DC

## 2024-01-31 NOTE — Progress Notes (Signed)
 Office: [phone]  /  Fax: [phone]  WEIGHT SUMMARY AND BIOMETRICS  Anthropometric Measurements Height: 5' 8 (1.727 m) Weight: 256 lb (116.1 kg) BMI (Calculated): 38.93 Weight at Last Visit: 254 lb Weight Lost Since Last Visit: 0 Weight Gained Since Last Visit: 2 lb Starting Weight: 305 lb Total Weight Loss (lbs): 49 lb (22.2 kg) Peak Weight: 315 lb   Body Composition  Body Fat %: 31.5 % Fat Mass (lbs): 80.8 lbs Muscle Mass (lbs): 167.4 lbs Total Body Water (lbs): 113.8 lbs Visceral Fat Rating : 19   Other Clinical Data Fasting: yes Labs: no Today's Visit #: 24 Starting Date: 09/15/17    Chief Complaint: OBESITY    History of Present Illness   He presents for obesity treatment and management of type 2 diabetes.  He has been struggling to adhere to his obesity treatment plan, following his category four eating plan only about fifty percent of the time. He has gained two pounds over the last month, which he attributes to water weight. Increased frequency of eating out and 'grab and go' meals due to multiple stressors in his life are contributing to his difficulty in meal planning and weight management. He is walking ten minutes three times a week.  His type 2 diabetes is managed with Ozempic  at a dose of 1.25 mg, using a 2 mg pen and administering 45 clicks to meet the dose. His most recent hemoglobin A1c, taken about seven months ago, was well controlled at 5.4%.  He has a history of vitamin D  deficiency and is on prescription vitamin D . His last level had dropped to 37.5.  He also has a history of B12 deficiency and has not been taking his B12 supplements, having forgotten about them. His B12 level has been drifting down over the last year and is now at 304, below the goal of 500 or greater.  He reports experiencing left shoulder pain, which has recurred recently without any recent injury. He had shoulder surgery previously and is having difficulty  following up with his orthopedic surgeon.  Results LABS   Hemoglobin A1c: 5.4%   Vitamin D : 37.5 ng/mL   Vitamin B12: 304 pg/mL       PHYSICAL EXAM:  Blood pressure 107/72, pulse 84, temperature 98 F (36.7 C), height 5' 8 (1.727 m), weight 256 lb (116.1 kg), SpO2 98%. Body mass index is 38.92 kg/m.  DIAGNOSTIC DATA REVIEWED:  BMET    Component Value Date/Time   NA 141 06/22/2023 1151   K 5.1 06/22/2023 1151   CL 103 06/22/2023 1151   CO2 22 06/22/2023 1151   GLUCOSE 80 06/22/2023 1151   GLUCOSE 123 (H) 06/25/2022 1621   BUN 26 (H) 06/22/2023 1151   CREATININE 1.24 06/22/2023 1151   CREATININE 1.02 08/24/2017 1458   CALCIUM  9.4 06/22/2023 1151   GFRNONAA >60 06/25/2022 1621   GFRNONAA >89 07/18/2015 1656   GFRAA 112 08/06/2020 0734   GFRAA >89 07/18/2015 1656   Lab Results  Component Value Date   HGBA1C 5.4 06/22/2023   HGBA1C 5.4 07/03/2011   Lab Results  Component Value Date   INSULIN  38.9 (H) 06/22/2023   INSULIN  110.6 (H) 09/15/2017   Lab Results  Component Value Date   TSH 2.310 09/01/2022   CBC    Component Value Date/Time   WBC 4.9 09/01/2022 0950   WBC 6.9 06/25/2022 1621   RBC 5.10 09/01/2022 0950   RBC 6.17 (H) 06/25/2022 1621   HGB 14.1 09/01/2022 0950  HCT 43.4 09/01/2022 0950   PLT 152 09/01/2022 0950   MCV 85 09/01/2022 0950   MCH 27.6 09/01/2022 0950   MCH 27.7 06/25/2022 1621   MCHC 32.5 09/01/2022 0950   MCHC 34.6 06/25/2022 1621   RDW 15.1 09/01/2022 0950   Iron Studies No results found for: IRON, TIBC, FERRITIN, IRONPCTSAT Lipid Panel     Component Value Date/Time   CHOL 114 06/22/2023 1151   TRIG 130 06/22/2023 1151   HDL 40 06/22/2023 1151   CHOLHDL 3.4 08/24/2017 1458   VLDL 70 (H) 10/15/2015 1112   LDLCALC 51 06/22/2023 1151   LDLCALC 57 08/24/2017 1458   Hepatic Function Panel     Component Value Date/Time   PROT 6.2 06/22/2023 1151   ALBUMIN 4.2 06/22/2023 1151   AST 11 06/22/2023 1151   ALT 13  06/22/2023 1151   ALKPHOS 58 06/22/2023 1151   BILITOT 0.5 06/22/2023 1151   BILIDIR 0.1 04/11/2015 1638   IBILI 0.6 04/11/2015 1638      Component Value Date/Time   TSH 2.310 09/01/2022 0950   Nutritional Lab Results  Component Value Date   VD25OH 37.5 06/22/2023   VD25OH 54.6 02/23/2023   VD25OH 49.0 09/01/2022     Assessment and Plan Assessment & Plan   Obesity Obesity management is challenging due to multiple life stressors and difficulty adhering to dietary plans. Current adherence to the category four eating plan is about 50%. Physical activity includes walking 10 minutes three times a week. Recent weight gain of two pounds, attributed to water weight. - Continue category four eating plan with emphasis on adherence - Continue walking 10 minutes three times a week - Follow up in 4 weeks  Type 2 Diabetes Mellitus Type 2 diabetes is well-controlled with Ozempic  at 1.25 mg. Last hemoglobin A1c was 5.4, measured seven months ago.  Left Shoulder Pain Recurrent left shoulder pain with no recent injury. Previous shoulder surgery. Difficulty following up with orthopedic surgeon. - Refer to sports medicine for shoulder evaluation  Vitamin D  Deficiency Vitamin D  deficiency with recent level at 37.5. Currently on prescription vitamin D . - Refill prescription vitamin D   Vitamin B12 Deficiency Vitamin B12 deficiency with current level at 304, below the goal of 500 or greater. Has not been taking B12 supplements due to forgetfulness. Prescription B12 is more affordable with insurance. - Refill prescription B12    He was informed of the importance of frequent follow up visits to maximize his success with intensive lifestyle modifications for his multiple health conditions.    Louann Penton, MD

## 2024-02-14 NOTE — Progress Notes (Signed)
 [name] [name] [address] [place] [address] Phone: [phone]   Assessment and Plan:     1. Chronic left shoulder pain 2. History of arthroscopy of left shoulder  -Chronic with exacerbation, initial visit - Left shoulder pain for 8 months without specific MOI.  Concern for subluxations on physical exam causing clunking, rotator cuff restriction, and capsule laxity.  Patient had arthroscopy followed by close manipulation for adhesive capsulitis in 2023 with significant improvement in left shoulder pain 2023 in 2024. - Patient is already taking meloxicam, gabapentin , Flexeril , Percocet for other conditions and is not seeing significant benefit in shoulder pain - Patient elected for subacromial CSI.  Tolerated well per note below - Start HEP with goal of rotator cuff strengthening and stabilization  Procedure: Subacromial Injection Side: Left  Risks explained and consent was given verbally. The site was cleaned with alcohol prep. A steroid injection was performed from posterior approach using 2mL of 1% lidocaine  without epinephrine  and 1mL of kenalog  40mg /ml. This was well tolerated.  Needle was removed, hemostasis achieved, and post injection instructions were explained.   Pt was advised to call or return to clinic if these symptoms worsen or fail to improve as anticipated.   15 additional minutes spent for educating Therapeutic Home Exercise Program.  This included exercises focusing on stretching, strengthening, with focus on eccentric aspects.   Long term goals include an improvement in range of motion, strength, endurance as well as avoiding reinjury. Patient's frequency would include in 1-2 times a day, 3-5 times a week for a duration of 6-12 weeks. Proper technique shown and discussed handout in great detail with ATC.  All questions were discussed and answered.    Pertinent previous records reviewed include orthopedic surgery  note 01/20/2022  Follow Up: 4 weeks for reevaluation.  Could consider intra-articular CSI versus MRI versus physical therapy   Subjective:   I, Brandon [name], am serving as a Neurosurgeon for Doctor [employer].   Chief Complaint: left shoulder pain   HPI:   02/15/2024 Patient is a 53 year old male with left shoulder pain for past 8 months. Patient states that they had surgery in 2023 for dislocations. Shoulder feels loose and has a pinching sensation in deltoid. Pain worse when they are not supporting their arm. Pain feels deep. Feels decrease in ROM. Uses a cane but not on the L side. Denies any radiating symptoms.    Relevant Historical Information: Hypertension, DM type II  Additional pertinent review of systems negative.   Current Outpatient Medications:    ASPIRIN  LOW DOSE 81 MG tablet, Take 81 mg by mouth daily., Disp: , Rfl:    atorvastatin  (LIPITOR) 20 MG tablet, TAKE 1 TABLET (20 MG TOTAL) BY MOUTH AT BEDTIME. (Patient taking differently: Take 20 mg by mouth at bedtime.), Disp: 30 tablet, Rfl: 0   chlorthalidone (HYGROTON) 25 MG tablet, Take 25 mg by mouth at bedtime., Disp: , Rfl:    cloNIDine  (CATAPRES ) 0.1 MG tablet, Take 0.1 mg by mouth 3 (three) times daily., Disp: , Rfl:    colesevelam  (WELCHOL ) 625 MG tablet, Take 3 tablets (1,875 mg total) by mouth daily with breakfast. (Patient taking differently: Take 1,875 mg by mouth 2 (two) times daily with a meal.), Disp: 540 tablet, Rfl: 1   cyanocobalamin (VITAMIN B12) 1000 MCG tablet, Take 1 tablet (1,000 mcg total) by mouth daily., Disp: 90 tablet, Rfl: 0   cyclobenzaprine  (FLEXERIL ) 10 MG tablet, Take 1  tablet (10 mg total) by mouth 3 (three) times daily as needed., Disp: 60 tablet, Rfl: 0   estradiol (ESTRACE) 2 MG tablet, Take by mouth., Disp: , Rfl:    estradiol (VIVELLE-DOT) 0.05 MG/24HR patch, Place 1 patch onto the skin 2 (two) times a week., Disp: , Rfl:    gabapentin  (NEURONTIN ) 300 MG capsule, Take 3 capsules (900  mg total) by mouth 2 (two) times daily., Disp: 180 capsule, Rfl: 0   Insulin  Pen Needle (BD PEN NEEDLE NANO U/F) 32G X 4 MM MISC, 1 Package by Does not apply route 2 (two) times daily., Disp: 100 each, Rfl: 0   ivabradine  (CORLANOR ) 7.5 MG TABS tablet, Take 2 tablets (15mg ) TWO hours prior to your cardiac CT scan., Disp: 2 tablet, Rfl: 0   ketoconazole (NIZORAL) 2 % cream, Apply topically 2 (two) times daily., Disp: , Rfl:    lisinopril  (ZESTRIL ) 2.5 MG tablet, Take by mouth., Disp: , Rfl:    meloxicam (MOBIC) 7.5 MG tablet, Take 7.5 mg by mouth 2 (two) times daily., Disp: , Rfl:    metFORMIN  (GLUCOPHAGE -XR) 500 MG 24 hr tablet, TAKE 2 TABLETS (1,000 MG TOTAL) BY MOUTH 2 (TWO) TIMES DAILY. (Patient taking differently: Take 1,000 mg by mouth 2 (two) times daily. TAKE 2 TABLETS (1,000 MG TOTAL) BY MOUTH 2 (TWO) TIMES DAILY.), Disp: 360 tablet, Rfl: 1   metoprolol  tartrate (LOPRESSOR ) 100 MG tablet, Take one tablet by mouth 2 hours prior to CT Scan, Disp: 1 tablet, Rfl: 0   NON FORMULARY, CPAP machine at bedtime & sleep, Disp: , Rfl:    ondansetron  (ZOFRAN ) 4 MG tablet, Take 1 tablet (4 mg total) by mouth every 8 (eight) hours as needed for nausea or vomiting., Disp: 20 tablet, Rfl: 0   OVER THE COUNTER MEDICATION, 2 (two) times daily. Docusate sodium  250 mg 2 tabs bid, Disp: , Rfl:    oxyCODONE -acetaminophen  (PERCOCET/ROXICET) 5-325 MG tablet, Take 1 tablet by mouth every 6 (six) hours as needed for severe pain (pain score 7-10)., Disp: 8 tablet, Rfl: 0   polyethylene glycol powder (GLYCOLAX /MIRALAX ) 17 GM/SCOOP powder, Take 17 g by mouth daily., Disp: 510 g, Rfl: 0   potassium chloride  (KLOR-CON ) 10 MEQ tablet, Take 10 mEq by mouth every evening., Disp: , Rfl:    Semaglutide , 1 MG/DOSE, 4 MG/3ML SOPN, 2mg  sq weekly, Disp: 9 mL, Rfl: 0   spironolactone (ALDACTONE) 50 MG tablet, Take 50 mg by mouth 2 (two) times daily., Disp: , Rfl:    tadalafil (CIALIS) 10 MG tablet, Take 10 mg by mouth as needed.,  Disp: , Rfl:    Vitamin D , Ergocalciferol , (DRISDOL ) 1.25 MG (50000 UNIT) CAPS capsule, Take 1 capsule (50,000 Units total) by mouth every 7 (seven) days., Disp: 12 capsule, Rfl: 0   Objective:     Vitals:   02/15/24 1434  BP: 108/66  Pulse: 65  SpO2: 96%  Height: 5' 8 (1.727 m)      Body mass index is 38.92 kg/m.    Physical Exam:    Gen: Appears well, nad, nontoxic and pleasant Neuro:sensation intact, strength is 5/5 with df/pf/inv/ev, muscle tone wnl Skin: no suspicious lesion or defmority Psych: A&O, appropriate mood and affect  Left shoulder:  No deformity, swelling or muscle wasting.  Well-healed trocar surgical scars No scapular winging FF 80, abd 80, int 20, ext 80 NTTP over the [place], clavicle, ac, coracoid, biceps groove, humerus, deltoid, trapezius, cervical spine Positive neer, hawkins, Negative empty can, obriens, crossarm, subscap  liftoff, speeds positive ant drawer,  Negative sulcus sign, apprehension Negative Spurling's test bilat FROM of neck    Electronically signed by:  Odis Mace [name] 3:04 PM 02/15/24

## 2024-02-15 ENCOUNTER — Ambulatory Visit: Admitting: Sports Medicine

## 2024-02-15 VITALS — BP 108/66 | HR 65 | Ht 68.0 in

## 2024-02-15 DIAGNOSIS — Z9889 Other specified postprocedural states: Secondary | ICD-10-CM

## 2024-02-15 DIAGNOSIS — M25512 Pain in left shoulder: Secondary | ICD-10-CM | POA: Diagnosis not present

## 2024-02-15 DIAGNOSIS — G8929 Other chronic pain: Secondary | ICD-10-CM | POA: Diagnosis not present

## 2024-02-15 NOTE — Patient Instructions (Signed)
 RTC HEP See me again in 4 weeks

## 2024-02-28 ENCOUNTER — Ambulatory Visit (INDEPENDENT_AMBULATORY_CARE_PROVIDER_SITE_OTHER): Admitting: Family Medicine

## 2024-02-28 ENCOUNTER — Encounter (INDEPENDENT_AMBULATORY_CARE_PROVIDER_SITE_OTHER): Payer: Self-pay | Admitting: Family Medicine

## 2024-02-28 VITALS — BP 84/55 | HR 76 | Temp 98.3°F | Ht 68.0 in | Wt 260.0 lb

## 2024-02-28 DIAGNOSIS — K5909 Other constipation: Secondary | ICD-10-CM

## 2024-02-28 DIAGNOSIS — Z7985 Long-term (current) use of injectable non-insulin antidiabetic drugs: Secondary | ICD-10-CM

## 2024-02-28 DIAGNOSIS — Z6839 Body mass index (BMI) 39.0-39.9, adult: Secondary | ICD-10-CM | POA: Diagnosis not present

## 2024-02-28 DIAGNOSIS — E119 Type 2 diabetes mellitus without complications: Secondary | ICD-10-CM

## 2024-02-28 DIAGNOSIS — E669 Obesity, unspecified: Secondary | ICD-10-CM | POA: Diagnosis not present

## 2024-02-28 DIAGNOSIS — Z7984 Long term (current) use of oral hypoglycemic drugs: Secondary | ICD-10-CM

## 2024-02-28 DIAGNOSIS — E1169 Type 2 diabetes mellitus with other specified complication: Secondary | ICD-10-CM

## 2024-02-28 DIAGNOSIS — K5901 Slow transit constipation: Secondary | ICD-10-CM

## 2024-02-28 MED ORDER — SEMAGLUTIDE (2 MG/DOSE) 8 MG/3ML ~~LOC~~ SOPN: 2.0000 mg | PEN_INJECTOR | SUBCUTANEOUS | 0 refills | Status: DC

## 2024-02-28 NOTE — Progress Notes (Signed)
 Office: [phone]  /  Fax: [phone]  WEIGHT SUMMARY AND BIOMETRICS  Anthropometric Measurements Height: 5' 8 (1.727 m) Weight: 260 lb (117.9 kg) BMI (Calculated): 39.54 Weight at Last Visit: 256 lb Weight Lost Since Last Visit: 0 Weight Gained Since Last Visit: 4 Starting Weight: 305 lb Total Weight Loss (lbs): 49 lb (22.2 kg)   Body Composition  Body Fat %: 32.4 % Fat Mass (lbs): 84.4 lbs Muscle Mass (lbs): 176.4 lbs Total Body Water (lbs): 116.4 lbs Visceral Fat Rating : 20   Other Clinical Data Fasting: no Labs: no Today's Visit #: 90 Starting Date: 09/15/17    Chief Complaint: OBESITY    History of Present Illness EVERTT CHOUINARD Tpwiijwrzm is a 53 year old male who presents for obesity treatment and progress assessment.  He has been adhering to a category four eating plan about fifty percent of the time and attempts to walk for about ten minutes most days of the week. Despite these efforts, he has gained four pounds in the last month. He reports that he has been stress eating and consuming more sugar during this period. He is focusing on protein intake, consuming [place] sandwiches, healthy bread, bagels, and occasionally a double fish sandwich from [employer] without cheese. He also drinks large glasses of chocolate milk with [employer] for protein, necessary for maintaining protein levels for plasma donation.  He experiences significant sleep disturbances, feeling exhausted and having difficulty falling asleep. His gabapentin  dosage was increased at night, and he takes melatonin, which helps him sleep through the night but does not aid in falling asleep and leaves him feeling too exhausted in the morning.  He is currently using Ozempic , but there was an issue with the supply, receiving a one milligram pen instead of two, causing him to go through it quicker. He takes a week off from Ozempic  every few weeks to prevent gastrointestinal issues, which seems to  be effective. He experiences mild paresis, with bowel movements occurring once every four days and significant constipation. He struggles with fluid retention and frequent urination, making it difficult to maintain hydration.  He has a high sensitivity to certain foods, particularly garlic, which affects his dietary choices. He is attempting to reduce sugar intake and has been trying to find suitable foods that meet his dietary needs without causing adverse reactions. He is involved in plasma donation, which requires maintaining adequate protein levels, and he is on a [facility] [employer] track, receiving higher compensation for his plasma.  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 He does not regularly check his blood sugar but has not noticed any unusual symptoms related to blood sugar levels. He occasionally misses breakfast due to sleep issues but has started incorporating yogurt as a protein-rich snack.  He does not regularly check his blood sugar but reports that he has not noticed any unusual symptoms or low blood sugar. He experiences significant constipation and fluid retention, with frequent urination and difficulty maintaining hydration.      PHYSICAL EXAM:  Blood pressure (!) 84/55, pulse 76, temperature 98.3 F (36.8 C), height 5' 8 (1.727 m), weight 260 lb (117.9 kg), SpO2 97%. Body mass index is 39.53 kg/m.  DIAGNOSTIC DATA REVIEWED:  BMET    Component Value Date/Time   NA 141 06/22/2023 1151   K 5.1 06/22/2023 1151   CL 103 06/22/2023 1151   CO2 22  06/22/2023 1151   GLUCOSE 80 06/22/2023 1151   GLUCOSE 123 (H) 06/25/2022 1621   BUN 26 (H) 06/22/2023 1151   CREATININE 1.24 06/22/2023 1151   CREATININE 1.02 08/24/2017 1458   CALCIUM  9.4 06/22/2023 1151   GFRNONAA >60 06/25/2022 1621   GFRNONAA >89 07/18/2015 1656   GFRAA 112 08/06/2020 0734   GFRAA >89 07/18/2015 1656    Lab Results  Component Value Date   HGBA1C 5.4 06/22/2023   HGBA1C 5.4 07/03/2011   Lab Results  Component Value Date   INSULIN  38.9 (H) 06/22/2023   INSULIN  110.6 (H) 09/15/2017   Lab Results  Component Value Date   TSH 2.310 09/01/2022   CBC    Component Value Date/Time   WBC 4.9 09/01/2022 0950   WBC 6.9 06/25/2022 1621   RBC 5.10 09/01/2022 0950   RBC 6.17 (H) 06/25/2022 1621   HGB 14.1 09/01/2022 0950   HCT 43.4 09/01/2022 0950   PLT 152 09/01/2022 0950   MCV 85 09/01/2022 0950   MCH 27.6 09/01/2022 0950   MCH 27.7 06/25/2022 1621   MCHC 32.5 09/01/2022 0950   MCHC 34.6 06/25/2022 1621   RDW 15.1 09/01/2022 0950   Iron Studies No results found for: IRON, TIBC, FERRITIN, IRONPCTSAT Lipid Panel     Component Value Date/Time   CHOL 114 06/22/2023 1151   TRIG 130 06/22/2023 1151   HDL 40 06/22/2023 1151   CHOLHDL 3.4 08/24/2017 1458   VLDL 70 (H) 10/15/2015 1112   LDLCALC 51 06/22/2023 1151   LDLCALC 57 08/24/2017 1458   Hepatic Function Panel     Component Value Date/Time   PROT 6.2 06/22/2023 1151   ALBUMIN 4.2 06/22/2023 1151   AST 11 06/22/2023 1151   ALT 13 06/22/2023 1151   ALKPHOS 58 06/22/2023 1151   BILITOT 0.5 06/22/2023 1151   BILIDIR 0.1 04/11/2015 1638   IBILI 0.6 04/11/2015 1638      Component Value Date/Time   TSH 2.310 09/01/2022 0950   Nutritional Lab Results  Component Value Date   VD25OH 37.5 06/22/2023   VD25OH 54.6 02/23/2023   VD25OH 49.0 09/01/2022     Assessment and Plan Assessment & Plan Obesity Obesity management is ongoing. He follows the category four eating plan about 50% of the time and engages in physical activity, walking about ten minutes most days. He has gained four pounds in the last month, with three pounds attributed to water retention and one pound as actual weight gain. Stress eating, particularly of carbohydrates, contributes to weight gain. He is focusing on protein intake to meet plasma  donation requirements. - Continue category four eating plan - Increase Ozempic  to two mg with a skip week to manage side effects - Encourage continued focus on protein intake - Encourage regular physical activity, such as walking  Chronic constipation Chronic constipation persists, with bowel movements approximately once every four days. He reports difficulty retaining fluids, which may contribute to constipation. He has tried Metamucil but finds the texture and flavor intolerable. - Encourage hydration despite fluid retention issues  Diabetes type two Type two diabetes management is ongoing. He does not regularly check blood glucose levels but has not noticed any unusual symptoms or hypoglycemic episodes. The current focus is on managing weight and dietary habits to support overall health and diabetes management. - Continue diet, exercise and weight loss as discussed today as an important part of the treatment plan     He was informed of the importance of frequent  follow up visits to maximize his success with intensive lifestyle modifications for his multiple health conditions.    Louann Penton, MD

## 2024-03-03 NOTE — Progress Notes (Deleted)
 [name] [address] [place] [address] Phone: [phone]   Assessment and Plan:     ***    Pertinent previous records reviewed include ***   Follow Up: ***     Subjective:   I, Brandon [name], am serving as a Neurosurgeon for Doctor [employer].    Chief Complaint: left shoulder pain    HPI:    02/15/2024 Patient is a 53 year old male with left shoulder pain for past 8 months. Patient states that they had surgery in 2023 for dislocations. Shoulder feels loose and has a pinching sensation in deltoid. Pain worse when they are not supporting their arm. Pain feels deep. Feels decrease in ROM. Uses a cane but not on the L side. Denies any radiating symptoms.    03/07/2024 Patient states   Relevant Historical Information: Hypertension, DM type II  Additional pertinent review of systems negative.   Current Outpatient Medications:    ASPIRIN  LOW DOSE 81 MG tablet, Take 81 mg by mouth daily., Disp: , Rfl:    atorvastatin  (LIPITOR) 20 MG tablet, TAKE 1 TABLET (20 MG TOTAL) BY MOUTH AT BEDTIME. (Patient taking differently: Take 20 mg by mouth at bedtime.), Disp: 30 tablet, Rfl: 0   chlorthalidone (HYGROTON) 25 MG tablet, Take 25 mg by mouth at bedtime., Disp: , Rfl:    cloNIDine  (CATAPRES ) 0.1 MG tablet, Take 0.1 mg by mouth 3 (three) times daily., Disp: , Rfl:    colesevelam  (WELCHOL ) 625 MG tablet, Take 3 tablets (1,875 mg total) by mouth daily with breakfast. (Patient taking differently: Take 1,875 mg by mouth 2 (two) times daily with a meal.), Disp: 540 tablet, Rfl: 1   cyanocobalamin (VITAMIN B12) 1000 MCG tablet, Take 1 tablet (1,000 mcg total) by mouth daily., Disp: 90 tablet, Rfl: 0   cyclobenzaprine  (FLEXERIL ) 10 MG tablet, Take 1 tablet (10 mg total) by mouth 3 (three) times daily as needed., Disp: 60 tablet, Rfl: 0   estradiol (ESTRACE) 2 MG tablet, Take by mouth., Disp: , Rfl:    estradiol (VIVELLE-DOT) 0.05  MG/24HR patch, Place 1 patch onto the skin 2 (two) times a week., Disp: , Rfl:    gabapentin  (NEURONTIN ) 300 MG capsule, Take 3 capsules (900 mg total) by mouth 2 (two) times daily., Disp: 180 capsule, Rfl: 0   Insulin  Pen Needle (BD PEN NEEDLE NANO U/F) 32G X 4 MM MISC, 1 Package by Does not apply route 2 (two) times daily., Disp: 100 each, Rfl: 0   ivabradine  (CORLANOR ) 7.5 MG TABS tablet, Take 2 tablets (15mg ) TWO hours prior to your cardiac CT scan., Disp: 2 tablet, Rfl: 0   ketoconazole (NIZORAL) 2 % cream, Apply topically 2 (two) times daily., Disp: , Rfl:    lisinopril  (ZESTRIL ) 2.5 MG tablet, Take by mouth., Disp: , Rfl:    meloxicam (MOBIC) 7.5 MG tablet, Take 7.5 mg by mouth 2 (two) times daily., Disp: , Rfl:    metFORMIN  (GLUCOPHAGE -XR) 500 MG 24 hr tablet, TAKE 2 TABLETS (1,000 MG TOTAL) BY MOUTH 2 (TWO) TIMES DAILY. (Patient taking differently: Take 1,000 mg by mouth 2 (two) times daily. TAKE 2 TABLETS (1,000 MG TOTAL) BY MOUTH 2 (TWO) TIMES DAILY.), Disp: 360 tablet, Rfl: 1   metoprolol  tartrate (LOPRESSOR ) 100 MG tablet, Take one tablet by mouth 2 hours prior to CT Scan, Disp: 1 tablet, Rfl: 0   NON FORMULARY, CPAP machine at bedtime & sleep, Disp: , Rfl:  ondansetron  (ZOFRAN ) 4 MG tablet, Take 1 tablet (4 mg total) by mouth every 8 (eight) hours as needed for nausea or vomiting., Disp: 20 tablet, Rfl: 0   OVER THE COUNTER MEDICATION, 2 (two) times daily. Docusate sodium  250 mg 2 tabs bid, Disp: , Rfl:    oxyCODONE -acetaminophen  (PERCOCET/ROXICET) 5-325 MG tablet, Take 1 tablet by mouth every 6 (six) hours as needed for severe pain (pain score 7-10)., Disp: 8 tablet, Rfl: 0   polyethylene glycol powder (GLYCOLAX /MIRALAX ) 17 GM/SCOOP powder, Take 17 g by mouth daily., Disp: 510 g, Rfl: 0   potassium chloride  (KLOR-CON ) 10 MEQ tablet, Take 10 mEq by mouth every evening., Disp: , Rfl:    Semaglutide , 2 MG/DOSE, 8 MG/3ML SOPN, Inject 2 mg as directed once a week., Disp: 3 mL, Rfl: 0    spironolactone (ALDACTONE) 50 MG tablet, Take 50 mg by mouth 2 (two) times daily., Disp: , Rfl:    tadalafil (CIALIS) 10 MG tablet, Take 10 mg by mouth as needed., Disp: , Rfl:    Vitamin D , Ergocalciferol , (DRISDOL ) 1.25 MG (50000 UNIT) CAPS capsule, Take 1 capsule (50,000 Units total) by mouth every 7 (seven) days., Disp: 12 capsule, Rfl: 0   Objective:     There were no vitals filed for this visit.    There is no height or weight on file to calculate BMI.    Physical Exam:    ***   Electronically signed by:  Odis Mace D.CLEMENTEEN [name] [name] 7:35 AM 03/03/24

## 2024-03-07 ENCOUNTER — Ambulatory Visit: Admitting: Sports Medicine

## 2024-03-08 ENCOUNTER — Encounter: Payer: Self-pay | Admitting: Sports Medicine

## 2024-03-27 LAB — HEPATIC FUNCTION PANEL: Bilirubin, Direct: 0.08 (ref 0.01–0.4)

## 2024-03-28 ENCOUNTER — Encounter (INDEPENDENT_AMBULATORY_CARE_PROVIDER_SITE_OTHER): Payer: Self-pay | Admitting: Family Medicine

## 2024-03-28 ENCOUNTER — Ambulatory Visit (INDEPENDENT_AMBULATORY_CARE_PROVIDER_SITE_OTHER): Admitting: Family Medicine

## 2024-03-28 VITALS — BP 97/65 | HR 77 | Temp 98.0°F | Ht 68.0 in | Wt 259.0 lb

## 2024-03-28 DIAGNOSIS — E559 Vitamin D deficiency, unspecified: Secondary | ICD-10-CM | POA: Diagnosis not present

## 2024-03-28 DIAGNOSIS — Z7985 Long-term (current) use of injectable non-insulin antidiabetic drugs: Secondary | ICD-10-CM

## 2024-03-28 DIAGNOSIS — E119 Type 2 diabetes mellitus without complications: Secondary | ICD-10-CM

## 2024-03-28 DIAGNOSIS — E1169 Type 2 diabetes mellitus with other specified complication: Secondary | ICD-10-CM

## 2024-03-28 DIAGNOSIS — I10 Essential (primary) hypertension: Secondary | ICD-10-CM

## 2024-03-28 DIAGNOSIS — E538 Deficiency of other specified B group vitamins: Secondary | ICD-10-CM | POA: Diagnosis not present

## 2024-03-28 DIAGNOSIS — Z6839 Body mass index (BMI) 39.0-39.9, adult: Secondary | ICD-10-CM

## 2024-03-28 DIAGNOSIS — F5101 Primary insomnia: Secondary | ICD-10-CM

## 2024-03-28 DIAGNOSIS — G4710 Hypersomnia, unspecified: Secondary | ICD-10-CM

## 2024-03-28 DIAGNOSIS — K5909 Other constipation: Secondary | ICD-10-CM

## 2024-03-28 DIAGNOSIS — E66813 Obesity, class 3: Secondary | ICD-10-CM

## 2024-03-28 DIAGNOSIS — F5104 Psychophysiologic insomnia: Secondary | ICD-10-CM

## 2024-03-28 DIAGNOSIS — K5901 Slow transit constipation: Secondary | ICD-10-CM

## 2024-03-28 MED ORDER — VITAMIN D (ERGOCALCIFEROL) 1.25 MG (50000 UNIT) PO CAPS: 50000.0000 [IU] | ORAL_CAPSULE | ORAL | 0 refills | Status: DC

## 2024-03-28 MED ORDER — VITAMIN B-12 1000 MCG PO TABS: 1000.0000 ug | ORAL_TABLET | Freq: Every day | ORAL | 0 refills | Status: AC

## 2024-03-28 MED ORDER — SEMAGLUTIDE (2 MG/DOSE) 8 MG/3ML ~~LOC~~ SOPN: 2.0000 mg | PEN_INJECTOR | SUBCUTANEOUS | 0 refills | Status: DC

## 2024-03-28 NOTE — Progress Notes (Signed)
 Office: [phone]  /  Fax: [phone]  WEIGHT SUMMARY AND BIOMETRICS  Anthropometric Measurements Height: 5' 8 (1.727 m) Weight: 259 lb (117.5 kg) BMI (Calculated): 39.39 Weight at Last Visit: 260 lb Weight Lost Since Last Visit: 1 lb Weight Gained Since Last Visit: 0 Starting Weight: 305 lb Total Weight Loss (lbs): 46 lb (20.9 kg) Peak Weight: 315 lb   Body Composition  Body Fat %: 33 % Fat Mass (lbs): 85.8 lbs Muscle Mass (lbs): 165.4 lbs Total Body Water (lbs): 118.2 lbs Visceral Fat Rating : 20   Other Clinical Data Fasting: no Labs: no Today's Visit #: 80 Starting Date: 09/15/17    Chief Complaint: OBESITY    History of Present Illness Brandon [name] Tpwiijwrzm is a 53 year old male who presents for obesity treatment and medication refills.  He is currently managing obesity with diet, exercise, and weight loss efforts. He is on Ozempic  2 mg weekly, taking a week off every three weeks to alleviate gastrointestinal discomfort, which has slowed weight loss but improved gastrointestinal comfort. He experiences bowel movements every four to five days, which he describes as his 'new normal'.  He has type 2 diabetes and is on Ozempic  2 mg weekly, requesting a refill for this medication. He also has a vitamin D  deficiency, treated with ergocalciferol  50,000 IU weekly, and a B12 deficiency, managed with B12 1000 mcg tablets daily, both of which he requests refills for.  Significant sleep disturbances have been present over the past two months, characterized by an inability to sleep at night and excessive daytime sleepiness. Sleep is non-restorative and easily disrupted by minor noises. He describes his sleep pattern as 'basically sleeping reverse'. Gabapentin  and melatonin have been used to aid sleep, with some improvement noted with increased gabapentin  dosage. He attributes part of his sleep issues to excessive light in his room and mentions needing blackout  curtains.  Fatigue impacts his daily activities, including missing a doctor's appointment for his shoulder. He has not been able to exercise this month due to exhaustion. Dietary challenges include eating irregularly due to lack of appetite, typically skipping breakfast, sometimes having a protein shake or chocolate milk, and eating [place] sandwiches for lunch. He ensures adequate protein intake due to regular plasma donations, which require a minimum protein level.  He has a history of high blood pressure. He has a family history of various cancers, including colon and brain cancer on his father's side, and leukemia and bone cancer on his mother's side. He is vigilant about monitoring for symptoms and undergoes regular screenings due to a precancerous growth in his intestinal lining. No bleeding or other gastrointestinal symptoms aside from altered bowel habits.      PHYSICAL EXAM:  Blood pressure 97/65, pulse 77, temperature 98 F (36.7 C), height 5' 8 (1.727 m), weight 259 lb (117.5 kg), SpO2 97%. Body mass index is 39.38 kg/m.  DIAGNOSTIC DATA REVIEWED:  BMET    Component Value Date/Time   NA 141 06/22/2023 1151   K 5.1 06/22/2023 1151   CL 103 06/22/2023 1151   CO2 22 06/22/2023 1151   GLUCOSE 80 06/22/2023 1151   GLUCOSE 123 (H) 06/25/2022 1621   BUN 26 (H) 06/22/2023 1151   CREATININE 1.24 06/22/2023 1151   CREATININE 1.02 08/24/2017 1458   CALCIUM  9.4 06/22/2023 1151   GFRNONAA >60 06/25/2022 1621   GFRNONAA >89 07/18/2015 1656   GFRAA 112 08/06/2020 0734   GFRAA >89 07/18/2015 1656   Lab Results  Component Value Date   HGBA1C 5.4 06/22/2023   HGBA1C 5.4 07/03/2011   Lab Results  Component Value Date   INSULIN  38.9 (H) 06/22/2023   INSULIN  110.6 (H) 09/15/2017   Lab Results  Component Value Date   TSH 2.310 09/01/2022   CBC    Component Value Date/Time   WBC 4.9 09/01/2022 0950   WBC 6.9 06/25/2022 1621   RBC 5.10 09/01/2022 0950   RBC 6.17 (H)  06/25/2022 1621   HGB 14.1 09/01/2022 0950   HCT 43.4 09/01/2022 0950   PLT 152 09/01/2022 0950   MCV 85 09/01/2022 0950   MCH 27.6 09/01/2022 0950   MCH 27.7 06/25/2022 1621   MCHC 32.5 09/01/2022 0950   MCHC 34.6 06/25/2022 1621   RDW 15.1 09/01/2022 0950   Iron Studies No results found for: IRON, TIBC, FERRITIN, IRONPCTSAT Lipid Panel     Component Value Date/Time   CHOL 114 06/22/2023 1151   TRIG 130 06/22/2023 1151   HDL 40 06/22/2023 1151   CHOLHDL 3.4 08/24/2017 1458   VLDL 70 (H) 10/15/2015 1112   LDLCALC 51 06/22/2023 1151   LDLCALC 57 08/24/2017 1458   Hepatic Function Panel     Component Value Date/Time   PROT 6.2 06/22/2023 1151   ALBUMIN 4.2 06/22/2023 1151   AST 11 06/22/2023 1151   ALT 13 06/22/2023 1151   ALKPHOS 58 06/22/2023 1151   BILITOT 0.5 06/22/2023 1151   BILIDIR 0.1 04/11/2015 1638   IBILI 0.6 04/11/2015 1638      Component Value Date/Time   TSH 2.310 09/01/2022 0950   Nutritional Lab Results  Component Value Date   VD25OH 37.5 06/22/2023   VD25OH 54.6 02/23/2023   VD25OH 49.0 09/01/2022     Assessment and Plan Assessment & Plan Class 3 Obesity Class 3 obesity with slow weight loss due to disrupted sleep-wake cycle and dietary challenges. Down one pound since last visit. Poor sleep and irregular eating patterns are affecting weight management.  Chronic Insomnia and Hypersomnia (Disrupted Sleep-Wake Cycle) Chronic insomnia and hypersomnia with disrupted sleep-wake cycle for over two months. Difficulty sleeping at night and excessive daytime sleepiness. Current management includes gabapentin  and melatonin. Environmental factors such as light exposure may contribute to sleep disturbances. - Discuss sleep issues with primary care provider - Consider environmental modifications to improve sleep, such as blackout curtains  Chronic Constipation Chronic constipation. Bowel movements occur every four to five days, with occasional  longer intervals. Modified Ozempic  dosing schedule helps manage symptoms. - Continue modified Ozempic  dosing schedule - Monitor bowel movement frequency and symptoms - Continue diet, exercise and weight loss as discussed today as an important part of the treatment plan   Type 2 Diabetes Mellitus Type 2 diabetes managed with Ozempic  2 mg weekly. Experiencing gastrointestinal side effects, including constipation, leading to a modified dosing schedule with a week off every three weeks to manage symptoms. - Refill Ozempic  2 mg weekly - Continue modified dosing schedule with a week off every three weeks - Continue diet, exercise and weight loss as discussed today as an important part of the treatment plan   Essential Hypertension, Controlled Essential hypertension controlled with medication. Blood pressure is low at 97/65 mmHg, possibly due to medication or other factors. - Continue diet, exercise and weight loss as discussed today as an important part of the treatment plan, will continue to monitor closely   Vitamin D  Deficiency Vitamin D  deficiency treated with ergocalciferol  50,000 IU weekly. - Refill ergocalciferol  50,000 IU  weekly  Vitamin B12 Deficiency Vitamin B12 deficiency managed with 1000 mcg tablets daily. - Refill Vitamin B12 1000 mcg tablets daily       Torrell was informed of the importance of frequent follow up visits to maximize his success with intensive lifestyle modifications for his obesity and obesity related health conditions as recommended by USPSTF and CMS guidelines   Brandon Penton, MD

## 2024-04-03 LAB — BASIC METABOLIC PANEL WITH GFR
BUN: 17 (ref 4–21)
CO2: 23 — AB (ref 13–22)
Chloride: 99 (ref 99–108)
Creatinine: 1.2
Creatinine: 1.2
Glucose: 62
Potassium: 3.8 meq/L (ref 3.5–5.1)
Sodium: 138 (ref 137–147)

## 2024-04-03 LAB — VITAMIN D 25 HYDROXY (VIT D DEFICIENCY, FRACTURES): Vit D, 25-Hydroxy: 16.7

## 2024-04-03 LAB — COMPREHENSIVE METABOLIC PANEL WITH GFR
Albumin: 4.2 (ref 3.5–5.0)
Calcium: 8.9 (ref 8.7–10.7)
Globulin: 1.9
eGFR: 76

## 2024-04-03 LAB — CBC
RBC: 5.74 — AB (ref 3.87–5.11)
RBC: 5.74 — AB (ref 3.87–5.11)

## 2024-04-03 LAB — CBC AND DIFFERENTIAL
HCT: 48 (ref 39–52)
HCT: 48 (ref 39–52)
Hemoglobin: 15.9 (ref 13.0–17.0)
Hemoglobin: 15.9 (ref 13.0–17.0)
Platelets: 171 K/uL (ref 150–400)
Platelets: 171 K/uL (ref 150–400)
WBC: 6.6
WBC: 6.6

## 2024-04-03 LAB — LIPID PANEL
Cholesterol: 178 (ref 0–200)
HDL: 48 (ref 35–70)
LDL Cholesterol: 88
LDl/HDL Ratio: 1.8
Triglycerides: 252 — AB (ref 40–160)

## 2024-04-03 LAB — HEPATIC FUNCTION PANEL
ALT: 25 U/L
AST: 16
Alkaline Phosphatase: 62 (ref 25–125)
Bilirubin, Direct: 0.15 (ref 0.01–0.4)
Bilirubin, Total: 0.6

## 2024-04-03 LAB — MICROALBUMIN / CREATININE URINE RATIO: Microalb Creat Ratio: 4.9

## 2024-04-03 LAB — PROTEIN / CREATININE RATIO, URINE
Albumin, U: 13.9
Creatinine, Urine: 281

## 2024-04-03 LAB — HEMOGLOBIN A1C: Hemoglobin A1C: 5.2

## 2024-04-25 ENCOUNTER — Ambulatory Visit (INDEPENDENT_AMBULATORY_CARE_PROVIDER_SITE_OTHER): Admitting: Family Medicine

## 2024-04-25 ENCOUNTER — Encounter (INDEPENDENT_AMBULATORY_CARE_PROVIDER_SITE_OTHER): Payer: Self-pay | Admitting: Family Medicine

## 2024-04-25 VITALS — BP 112/77 | HR 83 | Temp 97.9°F | Ht 68.0 in | Wt 262.0 lb

## 2024-04-25 DIAGNOSIS — E669 Obesity, unspecified: Secondary | ICD-10-CM | POA: Diagnosis not present

## 2024-04-25 DIAGNOSIS — Z6839 Body mass index (BMI) 39.0-39.9, adult: Secondary | ICD-10-CM

## 2024-04-25 DIAGNOSIS — R1110 Vomiting, unspecified: Secondary | ICD-10-CM

## 2024-04-25 DIAGNOSIS — E119 Type 2 diabetes mellitus without complications: Secondary | ICD-10-CM

## 2024-04-25 DIAGNOSIS — G4700 Insomnia, unspecified: Secondary | ICD-10-CM

## 2024-04-25 DIAGNOSIS — Z7985 Long-term (current) use of injectable non-insulin antidiabetic drugs: Secondary | ICD-10-CM

## 2024-04-25 DIAGNOSIS — Z7984 Long term (current) use of oral hypoglycemic drugs: Secondary | ICD-10-CM

## 2024-04-25 MED ORDER — SEMAGLUTIDE (2 MG/DOSE) 8 MG/3ML ~~LOC~~ SOPN: 2.0000 mg | PEN_INJECTOR | SUBCUTANEOUS | 0 refills | Status: DC

## 2024-04-25 NOTE — Progress Notes (Signed)
 Office: [phone]  /  Fax: [phone]  WEIGHT SUMMARY AND BIOMETRICS  Anthropometric Measurements Height: 5' 8 (1.727 m) Weight: 262 lb (118.8 kg) BMI (Calculated): 39.85 Weight at Last Visit: 259 lb Weight Lost Since Last Visit: 0 Weight Gained Since Last Visit: 3 lb Starting Weight: 305 lb Total Weight Loss (lbs): 43 lb (19.5 kg) Peak Weight: 315 lb   Body Composition  Body Fat %: 33.3 % Fat Mass (lbs): 87.4 lbs Muscle Mass (lbs): 166.4 lbs Total Body Water (lbs): 115.4 lbs Visceral Fat Rating : 20   Other Clinical Data Fasting: yes Labs: no Today's Visit #: 21 Starting Date: 09/15/17    Chief Complaint: OBESITY   History of Present Illness Brandon [name] Tpwiijwrzm is a 53 year old male who presents for obesity treatment and progress assessment.  He follows the prescribed category four eating plan about fifty percent of the time. Efforts are being made to increase protein intake, but there are challenges with consuming fruits and vegetables and maintaining hydration. He often skips meals and experiences poor quality sleep, which is critical for weight management. He reports recent weight gain and notes dietary choices, including fried foods and sugary drinks, as well as possible hormonal influences. Cravings, particularly for salty foods, and struggles with sugar addiction are noted.  He is currently on metformin  and Ozempic  for type two diabetes management. He uses a two milligram pen of Ozempic  but is currently at a dose of one and a quarter milligrams. Occasionally, he skips a week of Ozempic  to manage side effects, sometimes accidentally. He is aware that prednisone  can affect blood sugar levels.  Sleep disturbances are a significant issue, with a sleep pattern described as 'backwards,' leading to lost daytime hours. He has tried Valium  and diphenhydramine  (Benadryl ) for sleep, with Valium  initially helping but running out before it could fully reset his  sleep cycle. Diphenhydramine  has not been effective, and he experiences 'horrific nightmares' with these medications.  He is undergoing hormone therapy and has been on estrogen and testosterone  treatments for nearly two years. Despite having estrogen and testosterone  levels at the desired range, significant feminization changes have not been observed. There is a concern about possible hypergonadism with low testosterone  levels. He is currently on prednisone  to potentially 'jumpstart' feminization.  Fluid retention and weight fluctuations are reported. He uses Tums for managing occasional burning sensations, which he keeps on hand regularly.      PHYSICAL EXAM:  Blood pressure 112/77, pulse 83, temperature 97.9 F (36.6 C), height 5' 8 (1.727 m), weight 262 lb (118.8 kg), SpO2 96%. Body mass index is 39.84 kg/m.  DIAGNOSTIC DATA REVIEWED:  BMET    Component Value Date/Time   NA 141 06/22/2023 1151   K 5.1 06/22/2023 1151   CL 103 06/22/2023 1151   CO2 22 06/22/2023 1151   GLUCOSE 80 06/22/2023 1151   GLUCOSE 123 (H) 06/25/2022 1621   BUN 26 (H) 06/22/2023 1151   CREATININE 1.24 06/22/2023 1151   CREATININE 1.02 08/24/2017 1458   CALCIUM  9.4 06/22/2023 1151   GFRNONAA >60 06/25/2022 1621   GFRNONAA >89 07/18/2015 1656   GFRAA 112 08/06/2020 0734   GFRAA >89 07/18/2015 1656   Lab Results  Component Value Date   HGBA1C 5.4 06/22/2023   HGBA1C 5.4 07/03/2011   Lab Results  Component Value Date   INSULIN  38.9 (H) 06/22/2023   INSULIN  110.6 (H) 09/15/2017   Lab Results  Component Value Date   TSH 2.310 09/01/2022  CBC    Component Value Date/Time   WBC 4.9 09/01/2022 0950   WBC 6.9 06/25/2022 1621   RBC 5.10 09/01/2022 0950   RBC 6.17 (H) 06/25/2022 1621   HGB 14.1 09/01/2022 0950   HCT 43.4 09/01/2022 0950   PLT 152 09/01/2022 0950   MCV 85 09/01/2022 0950   MCH 27.6 09/01/2022 0950   MCH 27.7 06/25/2022 1621   MCHC 32.5 09/01/2022 0950   MCHC 34.6  06/25/2022 1621   RDW 15.1 09/01/2022 0950   Iron Studies No results found for: IRON, TIBC, FERRITIN, IRONPCTSAT Lipid Panel     Component Value Date/Time   CHOL 114 06/22/2023 1151   TRIG 130 06/22/2023 1151   HDL 40 06/22/2023 1151   CHOLHDL 3.4 08/24/2017 1458   VLDL 70 (H) 10/15/2015 1112   LDLCALC 51 06/22/2023 1151   LDLCALC 57 08/24/2017 1458   Hepatic Function Panel     Component Value Date/Time   PROT 6.2 06/22/2023 1151   ALBUMIN 4.2 06/22/2023 1151   AST 11 06/22/2023 1151   ALT 13 06/22/2023 1151   ALKPHOS 58 06/22/2023 1151   BILITOT 0.5 06/22/2023 1151   BILIDIR 0.1 04/11/2015 1638   IBILI 0.6 04/11/2015 1638      Component Value Date/Time   TSH 2.310 09/01/2022 0950   Nutritional Lab Results  Component Value Date   VD25OH 37.5 06/22/2023   VD25OH 54.6 02/23/2023   VD25OH 49.0 09/01/2022     Assessment and Plan Assessment & Plan Obesity and weight management Obesity management is ongoing with challenges in dietary adherence, particularly in increasing protein intake and reducing fried foods. Weight gain is noted, potentially exacerbated by prednisone  use for gender dysphoria treatment. Hormonal changes may contribute to increased appetite and cravings, particularly for salty foods. Current weight management plan includes Ozempic  and metformin  for diabetes, which also aid in weight control. - Increase Ozempic  to 1.5 mg to aid in weight management. - Consider using a half dose instead of a skip week to maintain medication levels and monitor hunger. - Continue dietary modifications to reduce fried foods and increase protein intake.  Type 2 diabetes mellitus Type 2 diabetes is managed with metformin  and Ozempic . Blood sugar control is crucial, especially with the potential impact of prednisone  on blood sugar levels. Current regimen includes metformin  and Ozempic , with plans to adjust Ozempic  dosage to improve glycemic control. - Continue metformin   and Ozempic  for diabetes management. - Monitor blood sugar levels closely, especially with prednisone  use.  Insomnia Chronic insomnia with difficulty maintaining a regular sleep schedule. Previous use of Valium  was ineffective and not recommended due to addiction risk. Current use of diphenhydramine  (Benadryl ) provides some relief but is not ideal for long-term use. Sleep issues are impacting overall health and weight management. - Discuss sleep management options with Lauraine Patient, PA, including potential alternatives to diphenhydramine . - Consider using generic Benadryl , allowing up to two pills for increased efficacy short term to see how he reacts.  Gender transition under active hormone therapy by his PCP Transition is managed with hormone therapy, including estradiol and prednisone . Concerns about potential hypergonadism with low testosterone  levels. Prednisone  is used as a short-term measure to jumpstart feminization, with monitoring of liver function and other health parameters. Hormonal changes may contribute to weight gain and increased appetite.  - Discuss long-term hormone therapy plan with Lauraine Patient, PA, including potential duration and impact on weight and blood sugar. -Discussed how our bioimpedance scale parameters are still using male criteria,  and how we may or may not change this over when he reaches his goal. Will continue to monitor  Vomiting At the end of our visit, Brandon [name]  felt ill and had some vomiting in our restroom. I was informed of this after our visit. A message was sent to him to see how he is doing. He has a history of possible gastroparesis from his GI physician who, per patient, approved him to continue his GLP-1. Brandon [name] was sent a message inquiring how he is feeling and advising him to stop his Mounjaro  for now. If symptoms worsened, he is to go the the ER. If they have not resolved, he is to contact his PC for evaluation. If necessary, I can work him  into my schedule tomorrow to be seen as well. I am awaiting his answer through MyChart.     I personally spent a total of 49 minutes in the care of the patient today including preparing to see the patient, reviewing separately obtained history, performing a medically appropriate evaluation of current problems, placing orders in the EMR, documenting clinical information in the EMR, customized nutritional counseling for their specific health and social needs, and referring as appropriate to other health care professionals.    Brandon [name] was informed of the importance of frequent follow up visits to maximize his success with intensive lifestyle modifications for his obesity and obesity related health conditions as recommended by USPSTF and CMS guidelines   Brandon Penton, MD

## 2024-04-29 ENCOUNTER — Other Ambulatory Visit (INDEPENDENT_AMBULATORY_CARE_PROVIDER_SITE_OTHER): Payer: Self-pay | Admitting: Family Medicine

## 2024-04-29 DIAGNOSIS — E1169 Type 2 diabetes mellitus with other specified complication: Secondary | ICD-10-CM

## 2024-05-24 ENCOUNTER — Encounter (INDEPENDENT_AMBULATORY_CARE_PROVIDER_SITE_OTHER): Payer: Self-pay | Admitting: Family Medicine

## 2024-05-24 ENCOUNTER — Ambulatory Visit (INDEPENDENT_AMBULATORY_CARE_PROVIDER_SITE_OTHER): Payer: Self-pay | Admitting: Family Medicine

## 2024-05-24 VITALS — BP 104/74 | HR 99 | Temp 98.3°F | Ht 68.0 in | Wt 254.0 lb

## 2024-05-24 DIAGNOSIS — Z7984 Long term (current) use of oral hypoglycemic drugs: Secondary | ICD-10-CM

## 2024-05-24 DIAGNOSIS — E1169 Type 2 diabetes mellitus with other specified complication: Secondary | ICD-10-CM

## 2024-05-24 DIAGNOSIS — Z6838 Body mass index (BMI) 38.0-38.9, adult: Secondary | ICD-10-CM

## 2024-05-24 DIAGNOSIS — E559 Vitamin D deficiency, unspecified: Secondary | ICD-10-CM | POA: Diagnosis not present

## 2024-05-24 DIAGNOSIS — K3184 Gastroparesis: Secondary | ICD-10-CM

## 2024-05-24 DIAGNOSIS — E119 Type 2 diabetes mellitus without complications: Secondary | ICD-10-CM | POA: Diagnosis not present

## 2024-05-24 DIAGNOSIS — Z7985 Long-term (current) use of injectable non-insulin antidiabetic drugs: Secondary | ICD-10-CM

## 2024-05-24 MED ORDER — VITAMIN D (ERGOCALCIFEROL) 1.25 MG (50000 UNIT) PO CAPS: 50000.0000 [IU] | ORAL_CAPSULE | ORAL | 0 refills | Status: DC

## 2024-05-24 MED ORDER — SEMAGLUTIDE (2 MG/DOSE) 8 MG/3ML ~~LOC~~ SOPN: 2.0000 mg | PEN_INJECTOR | SUBCUTANEOUS | 0 refills | Status: DC

## 2024-05-24 NOTE — Progress Notes (Signed)
 Office: [phone]  /  Fax: [phone]  WEIGHT SUMMARY AND BIOMETRICS  Anthropometric Measurements Height: 5' 8 (1.727 m) Weight: 254 lb (115.2 kg) BMI (Calculated): 38.63 Weight at Last Visit: 262 lb Weight Lost Since Last Visit: 8 lb Weight Gained Since Last Visit: 0 Starting Weight: 305 b Total Weight Loss (lbs): 51 lb (23.1 kg) Peak Weight: 315 lb   Body Composition  Body Fat %: 32.6 % Fat Mass (lbs): 83 lbs Muscle Mass (lbs): 163.2 lbs Total Body Water (lbs): 114 lbs Visceral Fat Rating : 19   Other Clinical Data Fasting: no Labs: no Today's Visit #: 82 Starting Date: 09/15/17    Chief Complaint: OBESITY   History of Present Illness Brandon [name] is a 53 year old male with obesity and type 2 diabetes who presents for obesity treatment and progress assessment.  He follows a category four eating plan about fifty percent of the time and has lost eight pounds in the last month. He is not currently exercising. His weight loss is attributed to dietary changes, including reduced sugar intake and careful eating out choices.  He is being treated for type 2 diabetes with metformin  and Ozempic , taking 2 mg of Ozempic . His blood sugar has never been above 120, and he does not regularly check his blood sugar at home. He uses physical symptoms like 'a slight shake' to gauge low blood sugar. His last A1c was 5.4, but his insulin  level was 38, which is higher than desired.  He has a diagnosis of vitamin D  deficiency and is treated with ergocalciferol  50,000 IU per week. He requests a refill for this medication. He experienced a severe reaction after taking the vitamin on an empty stomach, which included gastrointestinal distress.  He experiences issues with stomach acid, particularly when consuming orange juice, and has been without his stomach medication for some time. He mentions a past incident of severe nausea and gastrointestinal distress after taking  vitamin D  on an empty stomach. He is unsure of the specific medication for his stomach issues but recalls it was prescribed to help with gastric motility while on Ozempic .  He donates plasma twice a week, which sometimes leaves him feeling drained and without energy. He acknowledges not hydrating adequately before donations, which may contribute to his symptoms. He mentions having the RSV enzyme, which makes his plasma valuable for RSV vaccine production.  He has a history of mild gastroparesis and follows up with his GI doctor who has approved him to stay on a GLP-1      PHYSICAL EXAM:  Blood pressure 104/74, pulse 99, temperature 98.3 F (36.8 C), height 5' 8 (1.727 m), weight 254 lb (115.2 kg), SpO2 98%. Body mass index is 38.62 kg/m.  DIAGNOSTIC DATA REVIEWED:  BMET    Component Value Date/Time   NA 141 06/22/2023 1151   K 5.1 06/22/2023 1151   CL 103 06/22/2023 1151   CO2 22 06/22/2023 1151   GLUCOSE 80 06/22/2023 1151   GLUCOSE 123 (H) 06/25/2022 1621   BUN 26 (H) 06/22/2023 1151   CREATININE 1.24 06/22/2023 1151   CREATININE 1.02 08/24/2017 1458   CALCIUM  9.4 06/22/2023 1151   GFRNONAA >60 06/25/2022 1621   GFRNONAA >89 07/18/2015 1656   GFRAA 112 08/06/2020 0734   GFRAA >89 07/18/2015 1656   Lab Results  Component Value Date   HGBA1C 5.4 06/22/2023   HGBA1C 5.4 07/03/2011   Lab Results  Component Value Date   INSULIN  38.9 (H) 06/22/2023  INSULIN  110.6 (H) 09/15/2017   Lab Results  Component Value Date   TSH 2.310 09/01/2022   CBC    Component Value Date/Time   WBC 4.9 09/01/2022 0950   WBC 6.9 06/25/2022 1621   RBC 5.10 09/01/2022 0950   RBC 6.17 (H) 06/25/2022 1621   HGB 14.1 09/01/2022 0950   HCT 43.4 09/01/2022 0950   PLT 152 09/01/2022 0950   MCV 85 09/01/2022 0950   MCH 27.6 09/01/2022 0950   MCH 27.7 06/25/2022 1621   MCHC 32.5 09/01/2022 0950   MCHC 34.6 06/25/2022 1621   RDW 15.1 09/01/2022 0950   Iron Studies No results found for:  IRON, TIBC, FERRITIN, IRONPCTSAT Lipid Panel     Component Value Date/Time   CHOL 114 06/22/2023 1151   TRIG 130 06/22/2023 1151   HDL 40 06/22/2023 1151   CHOLHDL 3.4 08/24/2017 1458   VLDL 70 (H) 10/15/2015 1112   LDLCALC 51 06/22/2023 1151   LDLCALC 57 08/24/2017 1458   Hepatic Function Panel     Component Value Date/Time   PROT 6.2 06/22/2023 1151   ALBUMIN 4.2 06/22/2023 1151   AST 11 06/22/2023 1151   ALT 13 06/22/2023 1151   ALKPHOS 58 06/22/2023 1151   BILITOT 0.5 06/22/2023 1151   BILIDIR 0.1 04/11/2015 1638   IBILI 0.6 04/11/2015 1638      Component Value Date/Time   TSH 2.310 09/01/2022 0950   Nutritional Lab Results  Component Value Date   VD25OH 37.5 06/22/2023   VD25OH 54.6 02/23/2023   VD25OH 49.0 09/01/2022     Assessment and Plan Assessment & Plan Morbid obesity due to excess calories Morbid obesity managed with a category four eating plan, followed approximately 50% of the time. No current exercise regimen. Weight loss of 8 pounds in the last month. Challenges with dietary adherence and plasma donation affecting energy levels. - Continue category four eating plan  Type 2 diabetes mellitus Type 2 diabetes managed with metformin  and Ozempic  2 mg. Blood sugar levels well-controlled with no significant hypoglycemic or hyperglycemic episodes. Last A1c was 5.4, improved from previous 7.7. Insulin  level improved to 38, but still above target of below 10. Benefits of continuing GLP-1 therapy outweigh potential risks, as determined by GI specialist. - Continue metformin  and Ozempic  2 mg - Will follow up with GI specialist for medication review  Vitamin D  deficiency Managed with rocalciferol 50,000 IU weekly. Requests refill of prescription. - Refilled rocalciferol 50,000 IU weekly prescription  Gastroparesis Mild gastroparesis managed by GI specialist, Dr. Charley. Approved to continue GLP-1 therapy as long as tolerated. Benefits of continuing  therapy outweigh potential risks. Medication list needs updating to ensure accuracy. - Will obtain last note from Dr. Charley to update medication list - Continue monitoring gastroparesis symptoms - Continue GLP-1 therapy as tolerated      Patients who are on anti-obesity medications are counseled on the importance of maintaining healthy lifestyle habits, including balanced nutrition, regular physical activity, and behavioral modifications,  Medication is an adjunct to, not a replacement for, lifestyle changes and that the long-term success and weight maintenance depend on continued adherence to these strategies.   Jodi was informed of the importance of frequent follow up visits to maximize his success with intensive lifestyle modifications for his obesity and obesity related health conditions as recommended by USPSTF and CMS guidelines  Louann Penton, MD

## 2024-05-29 ENCOUNTER — Telehealth (INDEPENDENT_AMBULATORY_CARE_PROVIDER_SITE_OTHER): Payer: Self-pay

## 2024-05-29 NOTE — Telephone Encounter (Signed)
 Call to Dr Charley office, no labs since 06/2023.  Call to Camie Patient, PA-C, spoke with Myrick.  Labs will be sent from 07/2023 until present.  Awaiting labs.

## 2024-06-05 NOTE — Telephone Encounter (Signed)
 Labs received

## 2024-06-08 ENCOUNTER — Other Ambulatory Visit (INDEPENDENT_AMBULATORY_CARE_PROVIDER_SITE_OTHER): Payer: Self-pay

## 2024-06-12 ENCOUNTER — Ambulatory Visit (INDEPENDENT_AMBULATORY_CARE_PROVIDER_SITE_OTHER): Payer: Self-pay | Admitting: Family Medicine

## 2024-06-13 ENCOUNTER — Other Ambulatory Visit (INDEPENDENT_AMBULATORY_CARE_PROVIDER_SITE_OTHER): Payer: Self-pay

## 2024-07-25 ENCOUNTER — Encounter (INDEPENDENT_AMBULATORY_CARE_PROVIDER_SITE_OTHER): Payer: Self-pay | Admitting: Family Medicine

## 2024-07-25 ENCOUNTER — Ambulatory Visit (INDEPENDENT_AMBULATORY_CARE_PROVIDER_SITE_OTHER): Admitting: Family Medicine

## 2024-07-25 VITALS — BP 98/65 | HR 72 | Temp 98.0°F | Ht 68.0 in | Wt 249.0 lb

## 2024-07-25 DIAGNOSIS — Z7984 Long term (current) use of oral hypoglycemic drugs: Secondary | ICD-10-CM | POA: Diagnosis not present

## 2024-07-25 DIAGNOSIS — Z7985 Long-term (current) use of injectable non-insulin antidiabetic drugs: Secondary | ICD-10-CM

## 2024-07-25 DIAGNOSIS — E1169 Type 2 diabetes mellitus with other specified complication: Secondary | ICD-10-CM

## 2024-07-25 DIAGNOSIS — E559 Vitamin D deficiency, unspecified: Secondary | ICD-10-CM | POA: Diagnosis not present

## 2024-07-25 DIAGNOSIS — Z6837 Body mass index (BMI) 37.0-37.9, adult: Secondary | ICD-10-CM | POA: Diagnosis not present

## 2024-07-25 DIAGNOSIS — E669 Obesity, unspecified: Secondary | ICD-10-CM | POA: Diagnosis not present

## 2024-07-25 MED ORDER — SEMAGLUTIDE (2 MG/DOSE) 8 MG/3ML ~~LOC~~ SOPN: 2.0000 mg | PEN_INJECTOR | SUBCUTANEOUS | 0 refills | Status: AC

## 2024-07-25 MED ORDER — VITAMIN D (ERGOCALCIFEROL) 1.25 MG (50000 UNIT) PO CAPS: 50000.0000 [IU] | ORAL_CAPSULE | ORAL | 0 refills | Status: AC

## 2024-07-25 NOTE — Progress Notes (Unsigned)
 "  Office: [phone]  /  Fax: [phone]  WEIGHT SUMMARY AND BIOMETRICS  Anthropometric Measurements Height: 5' 8 (1.727 m) Weight: 249 lb (112.9 kg) BMI (Calculated): 37.87 Weight at Last Visit: 254 lb Weight Lost Since Last Visit: 5 lb Weight Gained Since Last Visit: 0 Starting Weight: 305 lb Total Weight Loss (lbs): 56 lb (25.4 kg) Peak Weight: 315 lb   Body Composition  Body Fat %: 32.9 % Fat Mass (lbs): 82.2 lbs Muscle Mass (lbs): 159.2 lbs Total Body Water (lbs): 114.2 lbs Visceral Fat Rating : 19   Other Clinical Data Fasting: yes Labs: no Today's Visit #: 8 Starting Date: 09/15/17    Chief Complaint: OBESITY    History of Present Illness Brandon [name] Tpwiijwrzm is a 54 year old male who presents for obesity treatment and progress assessment.  He has been following the category four eating plan about fifty percent of the time, focusing on increasing protein intake and consuming more fruits and vegetables. However, he skips meals and has inadequate hydration. He is not currently engaging in any exercise. Despite these challenges, he has lost five pounds in the last two months and a total of fifty-six pounds overall.  He has type 2 diabetes and is currently taking metformin  XR 500 mg, two pills twice a day, and Ozempic  2 mg weekly. His last laboratory results from September 2025 showed an A1c of 5.2.  He is also being treated for vitamin D  deficiency with prescription ergocalciferol  50,000 IU per week. His vitamin D  levels had previously fallen significantly to 16.7, with a goal of reaching the fifties or sixties.      PHYSICAL EXAM:  Blood pressure 98/65, pulse 72, temperature 98 F (36.7 C), height 5' 8 (1.727 m), weight 249 lb (112.9 kg), SpO2 98%. Body mass index is 37.86 kg/m.  DIAGNOSTIC DATA REVIEWED BY MYSELF TODAY:  BMET    Component Value Date/Time   NA 138 04/03/2024 0000   K 3.8 04/03/2024 0000   CL 99 04/03/2024 0000   CO2  23 (A) 04/03/2024 0000   GLUCOSE 80 06/22/2023 1151   GLUCOSE 123 (H) 06/25/2022 1621   BUN 17 04/03/2024 0000   CREATININE 1.2 04/03/2024 0000   CREATININE 1.2 04/03/2024 0000   CREATININE 1.24 06/22/2023 1151   CREATININE 1.02 08/24/2017 1458   CALCIUM  8.9 04/03/2024 0000   GFRNONAA >60 06/25/2022 1621   GFRNONAA >89 07/18/2015 1656   GFRAA 112 08/06/2020 0734   GFRAA >89 07/18/2015 1656   Lab Results  Component Value Date   HGBA1C 5.2 04/03/2024   HGBA1C 5.4 07/03/2011   Lab Results  Component Value Date   INSULIN  38.9 (H) 06/22/2023   INSULIN  110.6 (H) 09/15/2017   Lab Results  Component Value Date   TSH 2.310 09/01/2022   CBC    Component Value Date/Time   WBC 6.6 04/03/2024 0000   WBC 6.6 04/03/2024 0000   WBC 6.9 06/25/2022 1621   RBC 5.74 (A) 04/03/2024 0000   RBC 5.74 (A) 04/03/2024 0000   HGB 15.9 04/03/2024 0000   HGB 15.9 04/03/2024 0000   HGB 14.1 09/01/2022 0950   HCT 48 04/03/2024 0000   HCT 48 04/03/2024 0000   HCT 43.4 09/01/2022 0950   PLT 171 04/03/2024 0000   PLT 171 04/03/2024 0000   PLT 152 09/01/2022 0950   MCV 85 09/01/2022 0950   MCH 27.6 09/01/2022 0950   MCH 27.7 06/25/2022 1621   MCHC 32.5 09/01/2022 0950  MCHC 34.6 06/25/2022 1621   RDW 15.1 09/01/2022 0950   Iron Studies No results found for: IRON, TIBC, FERRITIN, IRONPCTSAT Lipid Panel     Component Value Date/Time   CHOL 178 04/03/2024 0000   CHOL 114 06/22/2023 1151   TRIG 252 (A) 04/03/2024 0000   HDL 48 04/03/2024 0000   HDL 40 06/22/2023 1151   CHOLHDL 3.4 08/24/2017 1458   VLDL 70 (H) 10/15/2015 1112   LDLCALC 88 04/03/2024 0000   LDLCALC 51 06/22/2023 1151   LDLCALC 57 08/24/2017 1458   Hepatic Function Panel     Component Value Date/Time   PROT 6.2 06/22/2023 1151   ALBUMIN 4.2 04/03/2024 0000   ALBUMIN 4.2 06/22/2023 1151   AST 16 04/03/2024 0000   ALT 25 04/03/2024 0000   ALKPHOS 62 04/03/2024 0000   BILITOT 0.5 06/22/2023 1151   BILIDIR  0.15 04/03/2024 0000   IBILI 0.6 04/11/2015 1638      Component Value Date/Time   TSH 2.310 09/01/2022 0950   Nutritional Lab Results  Component Value Date   VD25OH 16.7 04/03/2024   VD25OH 31.3 08/03/2023   VD25OH 31.3 08/03/2023     Assessment and Plan Assessment & Plan Obesity Management is ongoing with a focus on dietary changes and weight loss. He has lost 5 pounds in the last two months and a total of 56 pounds. He is following the category four eating plan about 50% of the time, working on increasing protein intake, and consuming more fruits and vegetables. However, he is skipping meals and not hydrating adequately. He is not currently exercising. - Continue category four eating plan - Encouraged increased protein intake and consumption of fruits and vegetables - Advised against skipping meals and encouraged adequate hydration  Type 2 diabetes mellitus with other specified complication Type 2 diabetes is well-controlled with an A1c of 5.2 as of September 2025. He is currently on metformin  XR 500 mg, two pills twice a day, and Ozempic  1- 1.5 mg weekly. He requests a refill of Ozempic .  - Refilled Ozempic   - Continue metformin  XR 500 mg, two pills twice a day - Will check labs   Vitamin D  deficiency Vitamin D  levels have decreased to 16.7, with a goal of reaching the 50s or 60s. He is currently being treated with prescription ergocalciferol  50,000 IU per week. - Continue ergocalciferol  50,000 IU per week - Will check labs       Patients who are on anti-obesity medications are counseled on the importance of maintaining healthy lifestyle habits, including balanced nutrition, regular physical activity, and behavioral modifications,  Medication is an adjunct to, not a replacement for, lifestyle changes and that the long-term success and weight maintenance depend on continued adherence to these strategies.   Brandon [name] was informed of the importance of frequent follow up visits  to maximize his success with intensive lifestyle modifications for his obesity and obesity related health conditions as recommended by USPSTF and CMS guidelines  Louann Penton, MD   "

## 2024-07-26 LAB — CBC WITH DIFFERENTIAL/PLATELET
Basophils Absolute: 0 x10E3/uL (ref 0.0–0.2)
Basos: 1 %
EOS (ABSOLUTE): 0.2 x10E3/uL (ref 0.0–0.4)
Eos: 2 %
Hematocrit: 47 % (ref 37.5–51.0)
Hemoglobin: 15.3 g/dL (ref 13.0–17.7)
Immature Grans (Abs): 0 x10E3/uL (ref 0.0–0.1)
Immature Granulocytes: 0 %
Lymphocytes Absolute: 1.4 x10E3/uL (ref 0.7–3.1)
Lymphs: 19 %
MCH: 28.8 pg (ref 26.6–33.0)
MCHC: 32.6 g/dL (ref 31.5–35.7)
MCV: 88 fL (ref 79–97)
Monocytes Absolute: 0.6 x10E3/uL (ref 0.1–0.9)
Monocytes: 8 %
Neutrophils Absolute: 5.2 x10E3/uL (ref 1.4–7.0)
Neutrophils: 70 %
Platelets: 186 x10E3/uL (ref 150–450)
RBC: 5.32 x10E6/uL (ref 4.14–5.80)
RDW: 13.4 % (ref 11.6–15.4)
WBC: 7.5 x10E3/uL (ref 3.4–10.8)

## 2024-07-26 LAB — LIPID PANEL WITH LDL/HDL RATIO
Cholesterol, Total: 100 mg/dL (ref 100–199)
HDL: 40 mg/dL
LDL Chol Calc (NIH): 32 mg/dL (ref 0–99)
LDL/HDL Ratio: 0.8 ratio (ref 0.0–3.6)
Triglycerides: 170 mg/dL — ABNORMAL HIGH (ref 0–149)
VLDL Cholesterol Cal: 28 mg/dL (ref 5–40)

## 2024-07-26 LAB — CMP14+EGFR
ALT: 22 IU/L (ref 0–44)
AST: 17 IU/L (ref 0–40)
Albumin: 4.1 g/dL (ref 3.8–4.9)
Alkaline Phosphatase: 48 IU/L (ref 47–123)
BUN/Creatinine Ratio: 17 (ref 9–20)
BUN: 24 mg/dL (ref 6–24)
Bilirubin Total: 0.5 mg/dL (ref 0.0–1.2)
CO2: 21 mmol/L (ref 20–29)
Calcium: 9.6 mg/dL (ref 8.7–10.2)
Chloride: 102 mmol/L (ref 96–106)
Creatinine, Ser: 1.38 mg/dL — ABNORMAL HIGH (ref 0.76–1.27)
Globulin, Total: 2 g/dL (ref 1.5–4.5)
Glucose: 76 mg/dL (ref 70–99)
Potassium: 4.5 mmol/L (ref 3.5–5.2)
Sodium: 140 mmol/L (ref 134–144)
Total Protein: 6.1 g/dL (ref 6.0–8.5)
eGFR: 62 mL/min/1.73

## 2024-07-26 LAB — HEMOGLOBIN A1C
Est. average glucose Bld gHb Est-mCnc: 100 mg/dL
Hgb A1c MFr Bld: 5.1 % (ref 4.8–5.6)

## 2024-07-26 LAB — VITAMIN D 25 HYDROXY (VIT D DEFICIENCY, FRACTURES): Vit D, 25-Hydroxy: 36.8 ng/mL (ref 30.0–100.0)

## 2024-07-26 LAB — VITAMIN B12: Vitamin B-12: 915 pg/mL (ref 232–1245)

## 2024-07-26 LAB — INSULIN, RANDOM: INSULIN: 23.4 u[IU]/mL (ref 2.6–24.9)

## 2024-08-22 ENCOUNTER — Ambulatory Visit (INDEPENDENT_AMBULATORY_CARE_PROVIDER_SITE_OTHER): Admitting: Family Medicine

## 2024-09-19 ENCOUNTER — Ambulatory Visit (INDEPENDENT_AMBULATORY_CARE_PROVIDER_SITE_OTHER): Admitting: Family Medicine
